# Patient Record
Sex: Female | Born: 1955 | Race: White | Hispanic: No | Smoking: Never smoker
Health system: Southern US, Community
[De-identification: ages and names within clinical notes are randomized; demographics above are authoritative.]

## PROBLEM LIST (undated history)

## (undated) DIAGNOSIS — E049 Nontoxic goiter, unspecified: Secondary | ICD-10-CM

## (undated) DIAGNOSIS — E119 Type 2 diabetes mellitus without complications: Secondary | ICD-10-CM

## (undated) DIAGNOSIS — F419 Anxiety disorder, unspecified: Secondary | ICD-10-CM

## (undated) DIAGNOSIS — G43909 Migraine, unspecified, not intractable, without status migrainosus: Secondary | ICD-10-CM

## (undated) DIAGNOSIS — F319 Bipolar disorder, unspecified: Secondary | ICD-10-CM

## (undated) DIAGNOSIS — F32A Depression, unspecified: Secondary | ICD-10-CM

## (undated) DIAGNOSIS — F329 Major depressive disorder, single episode, unspecified: Secondary | ICD-10-CM

## (undated) DIAGNOSIS — J45909 Unspecified asthma, uncomplicated: Secondary | ICD-10-CM

## (undated) DIAGNOSIS — I4891 Unspecified atrial fibrillation: Secondary | ICD-10-CM

## (undated) DIAGNOSIS — K589 Irritable bowel syndrome without diarrhea: Secondary | ICD-10-CM

## (undated) HISTORY — PX: ATRIAL FIBRILLATION ABLATION: EP1191

## (undated) HISTORY — DX: Type 2 diabetes mellitus without complications: E11.9

## (undated) HISTORY — PX: KNEE SURGERY: SHX244

## (undated) HISTORY — PX: ABDOMINAL HYSTERECTOMY: SHX81

## (undated) HISTORY — PX: CARDIOVERSION: SHX1299

## (undated) HISTORY — PX: ROTATOR CUFF REPAIR: SHX139

## (undated) HISTORY — PX: CARDIAC CATHETERIZATION: SHX172

---
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
## 2002-11-21 DIAGNOSIS — J189 Pneumonia, unspecified organism: Secondary | ICD-10-CM

## 2002-11-21 HISTORY — DX: Pneumonia, unspecified organism: J18.9

## 2005-12-28 DIAGNOSIS — N3010 Interstitial cystitis (chronic) without hematuria: Secondary | ICD-10-CM | POA: Insufficient documentation

## 2005-12-28 HISTORY — DX: Interstitial cystitis (chronic) without hematuria: N30.10

## 2006-02-17 DIAGNOSIS — T50995A Adverse effect of other drugs, medicaments and biological substances, initial encounter: Secondary | ICD-10-CM | POA: Insufficient documentation

## 2006-02-17 HISTORY — DX: Adverse effect of other drugs, medicaments and biological substances, initial encounter: T50.995A

## 2008-03-20 DIAGNOSIS — E8881 Metabolic syndrome: Secondary | ICD-10-CM

## 2008-03-20 HISTORY — DX: Metabolic syndrome: E88.81

## 2013-10-11 DIAGNOSIS — F319 Bipolar disorder, unspecified: Secondary | ICD-10-CM

## 2013-10-11 DIAGNOSIS — E669 Obesity, unspecified: Secondary | ICD-10-CM

## 2013-10-11 DIAGNOSIS — E1169 Type 2 diabetes mellitus with other specified complication: Secondary | ICD-10-CM

## 2013-10-11 DIAGNOSIS — R519 Headache, unspecified: Secondary | ICD-10-CM | POA: Insufficient documentation

## 2013-10-11 DIAGNOSIS — G43909 Migraine, unspecified, not intractable, without status migrainosus: Secondary | ICD-10-CM | POA: Insufficient documentation

## 2013-10-11 DIAGNOSIS — G8929 Other chronic pain: Secondary | ICD-10-CM

## 2013-10-11 HISTORY — DX: Other chronic pain: G89.29

## 2013-10-11 HISTORY — DX: Bipolar disorder, unspecified: F31.9

## 2013-10-11 HISTORY — DX: Migraine, unspecified, not intractable, without status migrainosus: G43.909

## 2013-10-11 HISTORY — DX: Type 2 diabetes mellitus with other specified complication: E66.9

## 2013-10-11 HISTORY — DX: Type 2 diabetes mellitus with other specified complication: E11.69

## 2013-10-11 HISTORY — DX: Headache, unspecified: R51.9

## 2014-09-01 DIAGNOSIS — I4891 Unspecified atrial fibrillation: Secondary | ICD-10-CM | POA: Diagnosis present

## 2014-09-01 DIAGNOSIS — E669 Obesity, unspecified: Secondary | ICD-10-CM | POA: Insufficient documentation

## 2014-09-01 DIAGNOSIS — I200 Unstable angina: Secondary | ICD-10-CM

## 2014-09-01 DIAGNOSIS — E785 Hyperlipidemia, unspecified: Secondary | ICD-10-CM

## 2014-09-01 DIAGNOSIS — J45909 Unspecified asthma, uncomplicated: Secondary | ICD-10-CM | POA: Insufficient documentation

## 2014-09-01 HISTORY — DX: Obesity, unspecified: E66.9

## 2014-09-01 HISTORY — DX: Hyperlipidemia, unspecified: E78.5

## 2014-09-01 HISTORY — DX: Unstable angina: I20.0

## 2014-09-01 HISTORY — DX: Unspecified atrial fibrillation: I48.91

## 2015-11-14 DIAGNOSIS — Z7409 Other reduced mobility: Secondary | ICD-10-CM | POA: Insufficient documentation

## 2015-11-14 DIAGNOSIS — G8929 Other chronic pain: Secondary | ICD-10-CM | POA: Insufficient documentation

## 2015-11-14 DIAGNOSIS — Z789 Other specified health status: Secondary | ICD-10-CM

## 2015-11-14 DIAGNOSIS — M25522 Pain in left elbow: Secondary | ICD-10-CM | POA: Insufficient documentation

## 2015-11-14 DIAGNOSIS — M25512 Pain in left shoulder: Secondary | ICD-10-CM | POA: Insufficient documentation

## 2015-11-14 HISTORY — DX: Other specified health status: Z78.9

## 2015-11-14 HISTORY — DX: Other reduced mobility: Z74.09

## 2015-11-14 HISTORY — DX: Pain in left elbow: M25.522

## 2015-11-14 HISTORY — DX: Other chronic pain: G89.29

## 2016-04-15 DIAGNOSIS — R12 Heartburn: Secondary | ICD-10-CM | POA: Insufficient documentation

## 2016-04-15 DIAGNOSIS — Z1211 Encounter for screening for malignant neoplasm of colon: Secondary | ICD-10-CM

## 2016-04-15 HISTORY — DX: Heartburn: R12

## 2016-04-15 HISTORY — DX: Encounter for screening for malignant neoplasm of colon: Z12.11

## 2016-10-07 DIAGNOSIS — Z8659 Personal history of other mental and behavioral disorders: Secondary | ICD-10-CM

## 2016-10-07 HISTORY — DX: Personal history of other mental and behavioral disorders: Z86.59

## 2016-12-16 DIAGNOSIS — M7502 Adhesive capsulitis of left shoulder: Secondary | ICD-10-CM

## 2016-12-16 HISTORY — DX: Adhesive capsulitis of left shoulder: M75.02

## 2016-12-16 HISTORY — DX: Morbid (severe) obesity due to excess calories: E66.01

## 2017-04-22 DIAGNOSIS — N23 Unspecified renal colic: Secondary | ICD-10-CM

## 2017-04-22 HISTORY — DX: Unspecified renal colic: N23

## 2017-05-14 DIAGNOSIS — N3000 Acute cystitis without hematuria: Secondary | ICD-10-CM | POA: Insufficient documentation

## 2017-05-14 HISTORY — DX: Acute cystitis without hematuria: N30.00

## 2017-06-24 DIAGNOSIS — G40909 Epilepsy, unspecified, not intractable, without status epilepticus: Secondary | ICD-10-CM | POA: Insufficient documentation

## 2017-06-24 DIAGNOSIS — G43719 Chronic migraine without aura, intractable, without status migrainosus: Secondary | ICD-10-CM

## 2017-06-24 DIAGNOSIS — G35 Multiple sclerosis: Secondary | ICD-10-CM | POA: Insufficient documentation

## 2017-06-24 DIAGNOSIS — Z8679 Personal history of other diseases of the circulatory system: Secondary | ICD-10-CM

## 2017-06-24 HISTORY — DX: Epilepsy, unspecified, not intractable, without status epilepticus: G40.909

## 2017-06-24 HISTORY — DX: Chronic migraine without aura, intractable, without status migrainosus: G43.719

## 2017-06-24 HISTORY — DX: Personal history of other diseases of the circulatory system: Z86.79

## 2017-06-24 HISTORY — DX: Multiple sclerosis: G35

## 2018-06-05 DIAGNOSIS — G4730 Sleep apnea, unspecified: Secondary | ICD-10-CM | POA: Insufficient documentation

## 2018-06-05 HISTORY — DX: Sleep apnea, unspecified: G47.30

## 2019-02-03 DIAGNOSIS — L0390 Cellulitis, unspecified: Secondary | ICD-10-CM | POA: Insufficient documentation

## 2019-02-03 DIAGNOSIS — L0291 Cutaneous abscess, unspecified: Secondary | ICD-10-CM | POA: Insufficient documentation

## 2019-02-03 HISTORY — DX: Cellulitis, unspecified: L03.90

## 2019-02-03 HISTORY — DX: Cutaneous abscess, unspecified: L02.91

## 2019-02-04 DIAGNOSIS — Z9181 History of falling: Secondary | ICD-10-CM

## 2019-02-04 HISTORY — DX: History of falling: Z91.81

## 2019-09-06 ENCOUNTER — Other Ambulatory Visit: Payer: Self-pay

## 2019-09-06 ENCOUNTER — Encounter (HOSPITAL_BASED_OUTPATIENT_CLINIC_OR_DEPARTMENT_OTHER): Payer: Self-pay

## 2019-09-06 ENCOUNTER — Emergency Department (HOSPITAL_BASED_OUTPATIENT_CLINIC_OR_DEPARTMENT_OTHER)
Admission: EM | Admit: 2019-09-06 | Discharge: 2019-09-07 | Disposition: A | Discharge status: Home / Self Care | Payer: Medicaid - Out of State | Attending: Emergency Medicine | Admitting: Emergency Medicine

## 2019-09-06 DIAGNOSIS — G43809 Other migraine, not intractable, without status migrainosus: Secondary | ICD-10-CM | POA: Diagnosis present

## 2019-09-06 DIAGNOSIS — R002 Palpitations: Secondary | ICD-10-CM | POA: Diagnosis not present

## 2019-09-06 DIAGNOSIS — E119 Type 2 diabetes mellitus without complications: Secondary | ICD-10-CM | POA: Insufficient documentation

## 2019-09-06 DIAGNOSIS — G43409 Hemiplegic migraine, not intractable, without status migrainosus: Secondary | ICD-10-CM | POA: Diagnosis not present

## 2019-09-06 DIAGNOSIS — J45909 Unspecified asthma, uncomplicated: Secondary | ICD-10-CM | POA: Diagnosis not present

## 2019-09-06 HISTORY — DX: Type 2 diabetes mellitus without complications: E11.9

## 2019-09-06 HISTORY — DX: Unspecified atrial fibrillation: I48.91

## 2019-09-06 HISTORY — DX: Unspecified asthma, uncomplicated: J45.909

## 2019-09-06 MED ORDER — DIPHENHYDRAMINE HCL 50 MG/ML IJ SOLN
25.0000 mg | Freq: Once | INTRAMUSCULAR | Status: AC
  Administered 2019-09-06: 25 mg via INTRAVENOUS
  Filled 2019-09-06: qty 1

## 2019-09-06 MED ORDER — SODIUM CHLORIDE 0.9 % IV BOLUS
1000.0000 mL | Freq: Once | INTRAVENOUS | Status: AC
  Administered 2019-09-06: 1000 mL via INTRAVENOUS

## 2019-09-06 MED ORDER — METOCLOPRAMIDE HCL 5 MG/ML IJ SOLN
10.0000 mg | Freq: Once | INTRAMUSCULAR | Status: AC
  Administered 2019-09-06: 10 mg via INTRAVENOUS
  Filled 2019-09-06: qty 2

## 2019-09-06 NOTE — ED Triage Notes (Signed)
Pt c/o heart racing and feels like "afib" x today-also c/o migraine x today-NAD-steady gait

## 2019-09-06 NOTE — ED Provider Notes (Signed)
[facility] Provider Note: Georgena Spurling, MD, FACEP  CSN: [id number] MRN: [id number] ARRIVAL: 09/06/19 at [place]: [place]  Migraine   HISTORY OF PRESENT ILLNESS  09/06/19 11:09 PM Paula Stout is a 63 y.o. female with a history of migraines and atrial fibrillation.  She is here with headache that began about 3 PM.  It began in her right temple and radiates posteriorly.  It is aching and throbbing in nature and she rates it as a 9 out of 10.  She has associated nausea and photophobia but no vomiting.  It is like previous migraines.  She also had the onset of palpitations about 6 PM.  She experiences the sensation of a rapid heartbeat and measured her pulse between 150 and 180.  It lasted about 20 minutes.  During this time she had chest pain and shortness of breath but those resolved when the palpitations resolved.  She has not had a recurrence.   Past Medical History:  Diagnosis Date  . A-fib ([place])   . Asthma   . Diabetes mellitus without complication ([place])   . Migraine     Past Surgical History:  Procedure Laterality Date  . ABDOMINAL HYSTERECTOMY    . ATRIAL FIBRILLATION ABLATION    . CESAREAN SECTION    . KNEE SURGERY    . ROTATOR CUFF REPAIR      No family history on file.  Social History   Tobacco Use  . Smoking status: Never Smoker  . Smokeless tobacco: Never Used  Substance Use Topics  . Alcohol use: Never  . Drug use: Never    Prior to Admission medications   Not on File    Allergies Bee venom, Asa [aspirin], Nsaids, Sulfa antibiotics, and Vancomycin   REVIEW OF SYSTEMS  Negative except as noted here or in the History of Present Illness.   PHYSICAL EXAMINATION  Initial Vital Signs Blood pressure (!) 154/96, pulse 69, temperature 98.9 F (37.2 C), temperature source Oral, resp. rate 13, height 5\' 6"  (1.676 m), weight 102.5 kg, SpO2 99 %.  Examination General: Well-developed, well-nourished female in no  acute distress; appearance consistent with age of record HENT: normocephalic; atraumatic Eyes: pupils equal, round and reactive to light; extraocular muscles intact; photophobia Neck: supple Heart: regular rate and rhythm Lungs: clear to auscultation bilaterally Abdomen: soft; nondistended; nontender; bowel sounds present Extremities: No deformity; full range of motion; pulses normal Neurologic: Awake, alert and oriented; motor function intact in all extremities and symmetric; no facial droop Skin: Warm and dry Psychiatric: Normal mood and affect   RESULTS  Summary of this visit's results, reviewed and interpreted by myself:   EKG Interpretation  Date/Time:  Thursday September 06 2019 19:47:13 EST Ventricular Rate:  83 PR Interval:  200 QRS Duration: 84 QT Interval:  370 QTC Calculation: 434 R Axis:   -24 Text Interpretation: Normal sinus rhythm Normal ECG No previous ECGs available Confirmed by Shanon Rosser [phone]) on 09/06/2019 10:39:46 PM      Laboratory Studies: No results found for this or any previous visit (from the past 24 hour(s)). Imaging Studies: No results found.  ED COURSE and MDM  Nursing notes, initial and subsequent vitals signs, including pulse oximetry, reviewed and interpreted by myself.  Vitals:   09/06/19 2130 09/06/19 2200 09/06/19 2230 09/07/19 0030  BP: (!) 146/87 (!) 149/92 (!) 154/96 137/80  Pulse: 71 70 69 69  Resp: 16 15 13 13   Temp:  TempSrc:      SpO2: 97% 97% 99% 97%  Weight:      Height:       Medications  sodium chloride 0.9 % bolus 1,000 mL (0 mLs Intravenous Stopped 09/07/19 0040)  diphenhydrAMINE (BENADRYL) injection 25 mg (25 mg Intravenous Given 09/06/19 2336)  metoCLOPramide (REGLAN) injection 10 mg (10 mg Intravenous Given 09/06/19 2336)    12:44 AM Headache improved after IV fluids and medications.  Patient states she is ready to go home.  Patient has had no arrhythmia while on monitor in the ED.  PROCEDURES    Procedures   ED DIAGNOSES     ICD-10-CM   1. Sporadic migraine  G43.409   2. Rapid palpitations  R00.2        [place] Paolucci, Jenny Reichmann, MD 09/07/19 [phone]

## 2020-02-18 ENCOUNTER — Other Ambulatory Visit: Payer: Self-pay

## 2020-02-18 ENCOUNTER — Encounter: Payer: Self-pay | Admitting: Family Medicine

## 2020-02-18 ENCOUNTER — Ambulatory Visit (INDEPENDENT_AMBULATORY_CARE_PROVIDER_SITE_OTHER): Payer: 59 | Admitting: Family Medicine

## 2020-02-18 ENCOUNTER — Telehealth: Payer: Self-pay

## 2020-02-18 VITALS — BP 120/78 | HR 103 | Temp 97.0°F | Ht 66.0 in | Wt 244.2 lb

## 2020-02-18 DIAGNOSIS — I209 Angina pectoris, unspecified: Secondary | ICD-10-CM

## 2020-02-18 DIAGNOSIS — F3132 Bipolar disorder, current episode depressed, moderate: Secondary | ICD-10-CM

## 2020-02-18 DIAGNOSIS — F319 Bipolar disorder, unspecified: Secondary | ICD-10-CM

## 2020-02-18 DIAGNOSIS — F411 Generalized anxiety disorder: Secondary | ICD-10-CM | POA: Insufficient documentation

## 2020-02-18 DIAGNOSIS — G43109 Migraine with aura, not intractable, without status migrainosus: Secondary | ICD-10-CM | POA: Diagnosis not present

## 2020-02-18 DIAGNOSIS — E781 Pure hyperglyceridemia: Secondary | ICD-10-CM

## 2020-02-18 DIAGNOSIS — I482 Chronic atrial fibrillation, unspecified: Secondary | ICD-10-CM

## 2020-02-18 DIAGNOSIS — E1169 Type 2 diabetes mellitus with other specified complication: Secondary | ICD-10-CM

## 2020-02-18 DIAGNOSIS — F339 Major depressive disorder, recurrent, unspecified: Secondary | ICD-10-CM

## 2020-02-18 DIAGNOSIS — K219 Gastro-esophageal reflux disease without esophagitis: Secondary | ICD-10-CM

## 2020-02-18 DIAGNOSIS — E669 Obesity, unspecified: Secondary | ICD-10-CM

## 2020-02-18 HISTORY — DX: Generalized anxiety disorder: F41.1

## 2020-02-18 HISTORY — DX: Pure hyperglyceridemia: E78.1

## 2020-02-18 HISTORY — DX: Chronic atrial fibrillation, unspecified: I48.20

## 2020-02-18 HISTORY — DX: Bipolar disorder, current episode depressed, moderate: F31.32

## 2020-02-18 HISTORY — DX: Major depressive disorder, recurrent, unspecified: F33.9

## 2020-02-18 MED ORDER — FENOFIBRATE 145 MG PO TABS: 145.0000 mg | ORAL_TABLET | Freq: Every day | ORAL | 2 refills | Status: DC

## 2020-02-18 MED ORDER — BUPROPION HCL ER (XL) 300 MG PO TB24: 300.0000 mg | ORAL_TABLET | Freq: Every day | ORAL | 2 refills | Status: DC

## 2020-02-18 MED ORDER — QUETIAPINE FUMARATE 300 MG PO TABS: 300.0000 mg | ORAL_TABLET | Freq: Every day | ORAL | 2 refills | Status: DC

## 2020-02-18 MED ORDER — BUTALBITAL-APAP-CAFFEINE 50-325-40 MG PO TABS: 1.0000 | ORAL_TABLET | Freq: Four times a day (QID) | ORAL | 5 refills | Status: DC

## 2020-02-18 MED ORDER — ATORVASTATIN CALCIUM 20 MG PO TABS: 20.0000 mg | ORAL_TABLET | Freq: Every day | ORAL | 2 refills | Status: DC

## 2020-02-18 MED ORDER — LATUDA 120 MG PO TABS: 1.0000 | ORAL_TABLET | Freq: Every day | ORAL | 2 refills | Status: DC

## 2020-02-18 MED ORDER — FREESTYLE TEST VI STRP: 1.0000 | ORAL_STRIP | Freq: Every day | 3 refills | Status: DC

## 2020-02-18 MED ORDER — ONDANSETRON 4 MG PO TBDP: 4.0000 mg | ORAL_TABLET | Freq: Three times a day (TID) | ORAL | 2 refills | Status: DC | PRN

## 2020-02-18 MED ORDER — PANTOPRAZOLE SODIUM 40 MG PO TBEC: 40.0000 mg | DELAYED_RELEASE_TABLET | Freq: Every day | ORAL | 2 refills | Status: DC

## 2020-02-18 MED ORDER — METFORMIN HCL 1000 MG PO TABS: 1000.0000 mg | ORAL_TABLET | Freq: Two times a day (BID) | ORAL | 2 refills | Status: DC

## 2020-02-18 MED ORDER — ALOGLIPTIN BENZOATE 12.5 MG PO TABS: 1.0000 | ORAL_TABLET | Freq: Every day | ORAL | 2 refills | Status: DC

## 2020-02-18 MED ORDER — METOPROLOL SUCCINATE ER 25 MG PO TB24: 25.0000 mg | ORAL_TABLET | Freq: Every day | ORAL | 2 refills | Status: DC

## 2020-02-18 MED ORDER — DILTIAZEM HCL ER COATED BEADS 120 MG PO CP24: 120.0000 mg | ORAL_CAPSULE | Freq: Every day | ORAL | 2 refills | Status: DC

## 2020-02-18 MED ORDER — TOPIRAMATE 50 MG PO TABS: 150.0000 mg | ORAL_TABLET | Freq: Two times a day (BID) | ORAL | 2 refills | Status: DC

## 2020-02-18 MED ORDER — FREESTYLE LANCETS MISC: 1.0000 | Freq: Every day | 3 refills | Status: DC

## 2020-02-18 MED ORDER — APIXABAN 5 MG PO TABS: 5.0000 mg | ORAL_TABLET | Freq: Two times a day (BID) | ORAL | 2 refills | Status: DC

## 2020-02-18 MED ORDER — FREESTYLE SYSTEM KIT: PACK | 0 refills | Status: DC

## 2020-02-18 MED ORDER — NITROGLYCERIN 0.4 MG SL SUBL: 0.4000 mg | SUBLINGUAL_TABLET | SUBLINGUAL | 2 refills | Status: DC | PRN

## 2020-02-18 NOTE — Telephone Encounter (Signed)
PA initiated via Covermymeds; KEY: [id number]. Awaiting determination.

## 2020-02-18 NOTE — Patient Instructions (Signed)
Keep the diet clean and stay active.  If you do not hear anything about your referrals in the next 1-2 weeks, call our office and ask for an update.  Coping skills Choose 5 that work for you:  Take a deep breath  Count to 20  Read a book  Do a puzzle  Meditate  Bake  Sing  Knit  Garden  Pray  Go outside  Call a friend  Listen to music  Take a walk  Color  Send a note  Take a bath  Watch a movie  Be alone in a quiet place  Pet an animal  Visit a friend  Journal  Exercise  Stretch   Please consider counseling. Contact [phone] to schedule an appointment or inquire about cost/insurance coverage.  [facility] [address] [place], [place] [address] [phone]  [facility] [address] [place], [place] [address] [phone]  [facility] [place], [place] [address] [phone]  [facility] [address], [place], [place], #[phone] [address], [place], [place], [place]  [facility] [place], [place] and [place], [place], [place]  [facility] [place], [place]  Call one of these offices sooner than later as it can take 2-3 months to get a new patient appointment.   Let us know if you need anything.

## 2020-02-18 NOTE — Progress Notes (Signed)
Chief Complaint  Patient presents with  . New Patient (Initial Visit)       New Patient Visit SUBJECTIVE: HPI: Paula Stout is an 64 y.o.female who is being seen for establishing care.  The patient was previously seen in [place].   Patient has a history of type 2 diabetes.  She is on metformin 1000 mg twice daily and alogliptin 12.5 mg daily.  Diet is fair, she does not exercise routinely.  She is on Lipitor 20 mg daily.  She also takes fenofibrate 145 mg daily for hypertriglyceridemia.  Patient has a history of atrial fibrillation diagnosed around 2002.  She had an ablation in early 2020.  She does not have a cardiologist in the area.  She is on Eliquis 5 mg twice daily in addition to Cardizem 120 mg daily and Toprol 25 mg daily.  She is tolerating the medicine well and reports no adverse effects.  The patient has a history of migraines with aura.  She currently has around 10 headaches per month.  For abortive medicine is mainly Fioricet but she does have Maxalt prescribed.  She takes Topamax 150 mg twice daily for maintenance therapy.  She does not currently have a headache.  The patient has a history of bipolar 1 disorder in addition to anxiety and depression.  She is currently on Seroquel 300 mg nightly, Wellbutrin XL 300 mg daily, and Latuda 120 mg daily.  Her husband died around 1 year ago and her mood has not been stable.  She does not follow with a psychiatrist. No HI or SI.   Past Medical History:  Diagnosis Date  . A-fib ([place])   . Asthma   . Diabetes mellitus without complication ([place])   . Migraine    Past Surgical History:  Procedure Laterality Date  . ABDOMINAL HYSTERECTOMY    . ATRIAL FIBRILLATION ABLATION    . CESAREAN SECTION    . KNEE SURGERY    . ROTATOR CUFF REPAIR     History reviewed. No pertinent family history. Allergies  Allergen Reactions  . Bee Venom Anaphylaxis  . Asa [Aspirin] Nausea And Vomiting  . Nsaids Nausea And Vomiting  . Sulfa Antibiotics Rash   . Vancomycin Rash    Current Outpatient Medications:  .  albuterol (VENTOLIN HFA) 108 (90 Base) MCG/ACT inhaler, Inhale 2 puffs into the lungs every 4 (four) hours., Disp: , Rfl:  .  Alogliptin Benzoate 12.5 MG TABS, Take 1 tablet by mouth daily., Disp: 90 tablet, Rfl: 2 .  apixaban (ELIQUIS) 5 MG TABS tablet, Take 1 tablet (5 mg total) by mouth in the morning and at bedtime., Disp: 180 tablet, Rfl: 2 .  atorvastatin (LIPITOR) 20 MG tablet, Take 1 tablet (20 mg total) by mouth daily., Disp: 90 tablet, Rfl: 2 .  buPROPion (WELLBUTRIN XL) 300 MG 24 hr tablet, Take 1 tablet (300 mg total) by mouth daily., Disp: 90 tablet, Rfl: 2 .  butalbital-acetaminophen-caffeine (FIORICET) 50-325-40 MG tablet, Take 1 tablet by mouth every 6 (six) hours., Disp: 14 tablet, Rfl: 5 .  diltiazem (CARTIA XT) 120 MG 24 hr capsule, Take 1 capsule (120 mg total) by mouth daily., Disp: 90 capsule, Rfl: 2 .  fenofibrate (TRICOR) 145 MG tablet, Take 1 tablet (145 mg total) by mouth daily., Disp: 90 tablet, Rfl: 2 .  glucose blood (FREESTYLE TEST STRIPS) test strip, 1 each by Other route daily., Disp: 100 each, Rfl: 3 .  glucose monitoring kit (FREESTYLE) monitoring kit, Use daily to check blood  sugar.  DX E11.9, Disp: 1 each, Rfl: 0 .  Lancets (FREESTYLE) lancets, 1 each by Other route daily., Disp: 100 each, Rfl: 3 .  Lurasidone HCl (LATUDA) 120 MG TABS, Take 1 tablet (120 mg total) by mouth daily., Disp: 90 tablet, Rfl: 2 .  metoprolol succinate (TOPROL-XL) 25 MG 24 hr tablet, Take 1 tablet (25 mg total) by mouth daily., Disp: 90 tablet, Rfl: 2 .  nitroGLYCERIN (NITROSTAT) 0.4 MG SL tablet, Place 1 tablet (0.4 mg total) under the tongue as needed., Disp: 25 tablet, Rfl: 2 .  ondansetron (ZOFRAN-ODT) 4 MG disintegrating tablet, Take 1 tablet (4 mg total) by mouth every 8 (eight) hours as needed for nausea., Disp: 20 tablet, Rfl: 2 .  pantoprazole (PROTONIX) 40 MG tablet, Take 1 tablet (40 mg total) by mouth daily., Disp:  90 tablet, Rfl: 2 .  rizatriptan (MAXALT-MLT) 10 MG disintegrating tablet, Place 1 tablet under the tongue daily as needed., Disp: , Rfl:  .  topiramate (TOPAMAX) 50 MG tablet, Take 3 tablets (150 mg total) by mouth in the morning and at bedtime., Disp: 540 tablet, Rfl: 2 .  metFORMIN (GLUCOPHAGE) 1000 MG tablet, Take 1 tablet (1,000 mg total) by mouth 2 (two) times daily with a meal., Disp: 180 tablet, Rfl: 2 .  QUEtiapine (SEROQUEL) 300 MG tablet, Take 1 tablet (300 mg total) by mouth at bedtime., Disp: 90 tablet, Rfl: 2   OBJECTIVE: BP 120/78 (BP Location: Left Arm, Patient Position: Sitting, Cuff Size: Large)   Pulse (!) 103   Temp (!) 97 F (36.1 C) (Temporal)   Ht '5\' 6"'  (1.676 m)   Wt 244 lb 4 oz (110.8 kg)   SpO2 96%   BMI 39.42 kg/m  General:  well developed, well nourished, in no apparent distress Skin:  no significant moles, warts, or growths Nose:  nares patent, septum midline, mucosa normal Throat/Pharynx:  lips and gingiva without lesion; tongue and uvula midline; non-inflamed pharynx; no exudates or postnasal drainage Lungs:  clear to auscultation, breath sounds equal bilaterally, no respiratory distress Cardio:  regular rate and rhythm, no LE edema or bruits Musculoskeletal:  symmetrical muscle groups noted without atrophy or deformity Neuro:  gait normal Psych: well oriented with normal range of affect and appropriate judgment/insight  ASSESSMENT/PLAN: Chronic atrial fibrillation (HCC) - Plan: apixaban (ELIQUIS) 5 MG TABS tablet, diltiazem (CARTIA XT) 120 MG 24 hr capsule, metoprolol succinate (TOPROL-XL) 25 MG 24 hr tablet, Ambulatory referral to Cardiology  Diabetes mellitus type 2 in obese ([place]) - Plan: Alogliptin Benzoate 12.5 MG TABS, atorvastatin (LIPITOR) 20 MG tablet, metFORMIN (GLUCOPHAGE) 1000 MG tablet, Lancets (FREESTYLE) lancets, glucose blood (FREESTYLE TEST STRIPS) test strip, glucose monitoring kit (FREESTYLE) monitoring kit  Migraine with aura and  without status migrainosus, not intractable - Plan: rizatriptan (MAXALT-MLT) 10 MG disintegrating tablet, topiramate (TOPAMAX) 50 MG tablet, butalbital-acetaminophen-caffeine (FIORICET) 50-325-40 MG tablet, ondansetron (ZOFRAN-ODT) 4 MG disintegrating tablet  Hypertriglyceridemia - Plan: atorvastatin (LIPITOR) 20 MG tablet, fenofibrate (TRICOR) 145 MG tablet  Bipolar 1 disorder (HCC) - Plan: Lurasidone HCl (LATUDA) 120 MG TABS, QUEtiapine (SEROQUEL) 300 MG tablet, Ambulatory referral to Psychiatry  Depression, recurrent ([place]) - Plan: buPROPion (WELLBUTRIN XL) 300 MG 24 hr tablet  GAD (generalized anxiety disorder)  Angina pectoris (HCC) - Plan: nitroGLYCERIN (NITROSTAT) 0.4 MG SL tablet  Gastroesophageal reflux disease, unspecified whether esophagitis present - Plan: pantoprazole (PROTONIX) 40 MG tablet  1- Cont Eliquis, let me know if there are cost issues immediately, she has around 3 days' worth  left. Refer to cards. 2- Cont care. I will see her in 3 mo for a dedicated DM visit.  3- Cont Topamax and Fioricet prn.  4- Cont fibrate 5/6/7-continue current therapy with Latuda, Seroquel, and Wellbutrin.  I will refer her to the psychiatry team.  Anxiety coping techniques provided.  Outpatient psychiatry resources also provided. The patient voiced understanding and agreement to the plan.   [place], DO 02/18/20  3:52 PM

## 2020-02-19 ENCOUNTER — Telehealth: Payer: Self-pay

## 2020-02-19 ENCOUNTER — Inpatient Hospital Stay (HOSPITAL_BASED_OUTPATIENT_CLINIC_OR_DEPARTMENT_OTHER)
Admission: EM | Admit: 2020-02-19 | Discharge: 2020-02-21 | DRG: 310 | Disposition: A | Discharge status: Home / Self Care | Payer: 59 | Attending: Internal Medicine | Admitting: Internal Medicine

## 2020-02-19 ENCOUNTER — Encounter (HOSPITAL_BASED_OUTPATIENT_CLINIC_OR_DEPARTMENT_OTHER): Payer: Self-pay

## 2020-02-19 ENCOUNTER — Emergency Department (HOSPITAL_BASED_OUTPATIENT_CLINIC_OR_DEPARTMENT_OTHER): Payer: 59

## 2020-02-19 ENCOUNTER — Other Ambulatory Visit: Payer: Self-pay

## 2020-02-19 DIAGNOSIS — Z90710 Acquired absence of both cervix and uterus: Secondary | ICD-10-CM

## 2020-02-19 DIAGNOSIS — G43909 Migraine, unspecified, not intractable, without status migrainosus: Secondary | ICD-10-CM | POA: Diagnosis present

## 2020-02-19 DIAGNOSIS — Z881 Allergy status to other antibiotic agents status: Secondary | ICD-10-CM

## 2020-02-19 DIAGNOSIS — E1122 Type 2 diabetes mellitus with diabetic chronic kidney disease: Secondary | ICD-10-CM | POA: Diagnosis present

## 2020-02-19 DIAGNOSIS — Z6838 Body mass index (BMI) 38.0-38.9, adult: Secondary | ICD-10-CM

## 2020-02-19 DIAGNOSIS — E1169 Type 2 diabetes mellitus with other specified complication: Secondary | ICD-10-CM | POA: Diagnosis present

## 2020-02-19 DIAGNOSIS — I4892 Unspecified atrial flutter: Secondary | ICD-10-CM | POA: Diagnosis present

## 2020-02-19 DIAGNOSIS — F319 Bipolar disorder, unspecified: Secondary | ICD-10-CM | POA: Diagnosis present

## 2020-02-19 DIAGNOSIS — Z20822 Contact with and (suspected) exposure to covid-19: Secondary | ICD-10-CM | POA: Diagnosis present

## 2020-02-19 DIAGNOSIS — I129 Hypertensive chronic kidney disease with stage 1 through stage 4 chronic kidney disease, or unspecified chronic kidney disease: Secondary | ICD-10-CM | POA: Diagnosis present

## 2020-02-19 DIAGNOSIS — I480 Paroxysmal atrial fibrillation: Principal | ICD-10-CM | POA: Diagnosis present

## 2020-02-19 DIAGNOSIS — Z886 Allergy status to analgesic agent status: Secondary | ICD-10-CM

## 2020-02-19 DIAGNOSIS — Z888 Allergy status to other drugs, medicaments and biological substances status: Secondary | ICD-10-CM

## 2020-02-19 DIAGNOSIS — J45909 Unspecified asthma, uncomplicated: Secondary | ICD-10-CM | POA: Diagnosis present

## 2020-02-19 DIAGNOSIS — R079 Chest pain, unspecified: Secondary | ICD-10-CM

## 2020-02-19 DIAGNOSIS — N183 Chronic kidney disease, stage 3 unspecified: Secondary | ICD-10-CM | POA: Diagnosis present

## 2020-02-19 DIAGNOSIS — Z7984 Long term (current) use of oral hypoglycemic drugs: Secondary | ICD-10-CM

## 2020-02-19 DIAGNOSIS — I77810 Thoracic aortic ectasia: Secondary | ICD-10-CM | POA: Diagnosis present

## 2020-02-19 DIAGNOSIS — I4891 Unspecified atrial fibrillation: Secondary | ICD-10-CM | POA: Diagnosis present

## 2020-02-19 DIAGNOSIS — N1831 Chronic kidney disease, stage 3a: Secondary | ICD-10-CM | POA: Diagnosis present

## 2020-02-19 DIAGNOSIS — E785 Hyperlipidemia, unspecified: Secondary | ICD-10-CM | POA: Diagnosis present

## 2020-02-19 DIAGNOSIS — R7989 Other specified abnormal findings of blood chemistry: Secondary | ICD-10-CM | POA: Diagnosis present

## 2020-02-19 DIAGNOSIS — Z7901 Long term (current) use of anticoagulants: Secondary | ICD-10-CM

## 2020-02-19 DIAGNOSIS — E669 Obesity, unspecified: Secondary | ICD-10-CM | POA: Diagnosis present

## 2020-02-19 DIAGNOSIS — Z882 Allergy status to sulfonamides status: Secondary | ICD-10-CM

## 2020-02-19 DIAGNOSIS — Z79899 Other long term (current) drug therapy: Secondary | ICD-10-CM

## 2020-02-19 HISTORY — DX: Depression, unspecified: F32.A

## 2020-02-19 HISTORY — DX: Anxiety disorder, unspecified: F41.9

## 2020-02-19 HISTORY — DX: Bipolar disorder, unspecified: F31.9

## 2020-02-19 HISTORY — DX: Irritable bowel syndrome, unspecified: K58.9

## 2020-02-19 HISTORY — DX: Chest pain, unspecified: R07.9

## 2020-02-19 LAB — BASIC METABOLIC PANEL
Anion gap: 12 (ref 5–15)
BUN: 20 mg/dL (ref 8–23)
CO2: 20 mmol/L — ABNORMAL LOW (ref 22–32)
Calcium: 9.5 mg/dL (ref 8.9–10.3)
Chloride: 108 mmol/L (ref 98–111)
Creatinine, Ser: 1.99 mg/dL — ABNORMAL HIGH (ref 0.44–1.00)
GFR calc Af Amer: 30 mL/min — ABNORMAL LOW (ref >60)
GFR calc non Af Amer: 26 mL/min — ABNORMAL LOW (ref >60)
Glucose, Bld: 167 mg/dL — ABNORMAL HIGH (ref 70–99)
Potassium: 3.7 mmol/L (ref 3.5–5.1)
Sodium: 140 mmol/L (ref 135–145)

## 2020-02-19 LAB — CBC
HCT: 39.5 % (ref 36.0–46.0)
Hemoglobin: 12.6 g/dL (ref 12.0–15.0)
MCH: 29.4 pg (ref 26.0–34.0)
MCHC: 31.9 g/dL (ref 30.0–36.0)
MCV: 92.3 fL (ref 80.0–100.0)
Platelets: 332 10*3/uL (ref 150–400)
RBC: 4.28 MIL/uL (ref 3.87–5.11)
RDW: 14.6 % (ref 11.5–15.5)
WBC: 9.4 10*3/uL (ref 4.0–10.5)
nRBC: 0 % (ref 0.0–0.2)

## 2020-02-19 LAB — TROPONIN I (HIGH SENSITIVITY)
Troponin I (High Sensitivity): 4 ng/L (ref <18)
Troponin I (High Sensitivity): 8 ng/L (ref <18)

## 2020-02-19 MED ORDER — NITROGLYCERIN 0.4 MG SL SUBL
0.4000 mg | SUBLINGUAL_TABLET | SUBLINGUAL | Status: DC | PRN
  Administered 2020-02-19 (×2): 0.4 mg via SUBLINGUAL
  Filled 2020-02-19 (×2): qty 1

## 2020-02-19 NOTE — Telephone Encounter (Signed)
Will appeal however- Pt will need to come and sign appeal form (this is required by her plan to appeal PA determinations). Form given to Robin.

## 2020-02-19 NOTE — Telephone Encounter (Signed)
Plz see if pt has failed any of these other medications. Ty.

## 2020-02-19 NOTE — Telephone Encounter (Signed)
PA initiated via Covermymeds; KEY: [id number]. Awaiting determination.

## 2020-02-19 NOTE — Telephone Encounter (Signed)
I spoke with the patient and YES, She has tried respiridone ( stopped due to not working) Also tried Ziprasidine and worsened her migraines, And tried Abilify---was on before [place] and MD changed to [place] as felt abilify not working well for her and [name] would be much better.  She has been on [place] for one year.

## 2020-02-19 NOTE — Telephone Encounter (Signed)
Form has been signed by the patient and faxed///did receive fax confirmation.

## 2020-02-19 NOTE — Telephone Encounter (Signed)
PA denied.   -Pt must be participating in a behavorial management program, AND  -Trial, failure, intolerance, or contraindication to THREE of the following formulary alternatives: Risperidone, Quetiapine (she is currently on this), Olazapine, Ziprasidone, and Aripiprazole

## 2020-02-19 NOTE — ED Triage Notes (Signed)
Chest pain than began ~30 mins ago, central pressure that does not radiate. 1 nitro taken without relief. Hx of a fib & angina. SOB, dizziness.

## 2020-02-19 NOTE — Telephone Encounter (Signed)
PA approved. Effective 02/19/2020 to 02/18/2021.

## 2020-02-19 NOTE — ED Provider Notes (Signed)
[facility] EMERGENCY DEPARTMENT Provider Note   CSN: [id number] Arrival date & time: 02/19/20  1856     History Chief Complaint  Patient presents with  . Chest Pain    Paula Stout is a 64 y.o. female.  But states she is been    Patient presents with chest pain.  Pressure in the mid chest.  Has had around the half hour feeling a little bad all day.  Has had shortness of breath and felt dizzy.  States she has been feeling more short of breath.  Had an A. fib ablation 6 months ago.  States she was told there was another area of ablation that needed to be done but they could not do it due to Covid.  Also states that she has nitroglycerin that she took that mildly helped the pain.  States that she had been diagnosed with angina.  Has not had stress test or heart catheterization.  States that she was told that they needed to be done but due to Covid he could not get done.  No swelling her legs.  No fever.  States she has been out of A. fib since her ablation but also states that she is checked on her iWatch and she has been in and out of A. fib recently also.  She is on anticoagulation.  Does have history of SVT.  Also history of A. fib and states that on an iWatch recently her heart rate was going too fast that he could not get measured.    Past Medical History:  Diagnosis Date  . A-fib ([place])   . Asthma   . Diabetes mellitus without complication ([place])   . Migraine     Patient Active Problem List   Diagnosis Date Noted  . Chest pain 02/19/2020  . Chronic atrial fibrillation ([place]) 02/18/2020  . Diabetes mellitus type 2 in obese ([place]) 02/18/2020  . Migraine with aura and without status migrainosus, not intractable 02/18/2020  . Hypertriglyceridemia 02/18/2020  . Depression, recurrent ([place]) 02/18/2020  . Bipolar 1 disorder ([place]) 02/18/2020  . GAD (generalized anxiety disorder) 02/18/2020    Past Surgical History:  Procedure Laterality Date  . ABDOMINAL HYSTERECTOMY     . ATRIAL FIBRILLATION ABLATION    . CESAREAN SECTION    . KNEE SURGERY    . ROTATOR CUFF REPAIR       OB History   No obstetric history on file.     History reviewed. No pertinent family history.  Social History   Tobacco Use  . Smoking status: Never Smoker  . Smokeless tobacco: Never Used  Vaping Use  . Vaping Use: Never used  Substance Use Topics  . Alcohol use: Never  . Drug use: Never    Home Medications Prior to Admission medications   Medication Sig Start Date End Date Taking? Authorizing Provider  albuterol (VENTOLIN HFA) 108 (90 Base) MCG/ACT inhaler Inhale 2 puffs into the lungs every 4 (four) hours. 06/08/19   [provider]  Alogliptin Benzoate 12.5 MG TABS Take 1 tablet by mouth daily. 02/18/20   [name], DO  apixaban (ELIQUIS) 5 MG TABS tablet Take 1 tablet (5 mg total) by mouth in the morning and at bedtime. 02/18/20   [name], DO  atorvastatin (LIPITOR) 20 MG tablet Take 1 tablet (20 mg total) by mouth daily. 02/18/20   [name], DO  buPROPion (WELLBUTRIN XL) 300 MG 24 hr tablet Take 1 tablet (300 mg total) by  mouth daily. 02/18/20   [name], DO  butalbital-acetaminophen-caffeine (FIORICET) [phone] MG tablet Take 1 tablet by mouth every 6 (six) hours. 02/18/20   Wendling, Crosby Oyster, DO  diltiazem (CARTIA XT) 120 MG 24 hr capsule Take 1 capsule (120 mg total) by mouth daily. 02/18/20   [name], DO  fenofibrate (TRICOR) 145 MG tablet Take 1 tablet (145 mg total) by mouth daily. 02/18/20   [name], DO  glucose blood (FREESTYLE TEST STRIPS) test strip 1 each by Other route daily. 02/18/20   [name], DO  glucose monitoring kit (FREESTYLE) monitoring kit Use daily to check blood sugar.  DX E11.9 02/18/20   [name], DO  Lancets (FREESTYLE) lancets 1 each by Other route daily. 02/18/20   [name], DO  Lurasidone HCl  (LATUDA) 120 MG TABS Take 1 tablet (120 mg total) by mouth daily. 02/18/20   [name], DO  metFORMIN (GLUCOPHAGE) 1000 MG tablet Take 1 tablet (1,000 mg total) by mouth 2 (two) times daily with a meal. 02/18/20   Wendling, Crosby Oyster, DO  metoprolol succinate (TOPROL-XL) 25 MG 24 hr tablet Take 1 tablet (25 mg total) by mouth daily. 02/18/20   [name], DO  nitroGLYCERIN (NITROSTAT) 0.4 MG SL tablet Place 1 tablet (0.4 mg total) under the tongue as needed. 02/18/20   [name], DO  ondansetron (ZOFRAN-ODT) 4 MG disintegrating tablet Take 1 tablet (4 mg total) by mouth every 8 (eight) hours as needed for nausea. 02/18/20   [name], DO  pantoprazole (PROTONIX) 40 MG tablet Take 1 tablet (40 mg total) by mouth daily. 02/18/20   [name], DO  QUEtiapine (SEROQUEL) 300 MG tablet Take 1 tablet (300 mg total) by mouth at bedtime. 02/18/20   [name], DO  rizatriptan (MAXALT-MLT) 10 MG disintegrating tablet Place 1 tablet under the tongue daily as needed. 01/26/18   [provider]  topiramate (TOPAMAX) 50 MG tablet Take 3 tablets (150 mg total) by mouth in the morning and at bedtime. 02/18/20   [name], DO    Allergies    Bee venom, Asa [aspirin], Nsaids, Sulfa antibiotics, and Vancomycin  Review of Systems   Review of Systems  Constitutional: Negative for appetite change.  HENT: Negative for congestion.   Respiratory: Positive for shortness of breath.   Cardiovascular: Positive for chest pain and palpitations. Negative for leg swelling.  Gastrointestinal: Negative for abdominal pain.  Genitourinary: Negative for flank pain.  Musculoskeletal: Negative for back pain.  Skin: Negative for rash.  Neurological: Negative for weakness.  Psychiatric/Behavioral: Negative for confusion.    Physical Exam Updated Vital Signs BP (!) 149/89 Comment: room air  Pulse 77 Comment: room air  Temp 98.7  F (37.1 C) (Oral)   Resp 17 Comment: room air  Ht '5\' 6"'  (1.676 m)   Wt 108.9 kg   SpO2 97% Comment: room air  BMI 38.74 kg/m   Physical Exam Vitals and nursing note reviewed.  HENT:     Head: Normocephalic.  Cardiovascular:     Rate and Rhythm: Normal rate and regular rhythm.  Pulmonary:     Breath sounds: Normal breath sounds.  Chest:     Chest wall: No tenderness.  Abdominal:     Tenderness: There is no abdominal tenderness.  Musculoskeletal:     Cervical back: Neck supple.  Skin:    General: Skin is warm.     Capillary  Refill: Capillary refill takes less than 2 seconds.  Neurological:     Mental Status: She is alert and oriented to person, place, and time.     ED Results / Procedures / Treatments   Labs (all labs ordered are listed, but only abnormal results are displayed) Labs Reviewed  BASIC METABOLIC PANEL - Abnormal; Notable for the following components:      Result Value   CO2 20 (*)    Glucose, Bld 167 (*)    Creatinine, Ser 1.99 (*)    GFR calc non Af Amer 26 (*)    GFR calc Af Amer 30 (*)    All other components within normal limits  CBC  TROPONIN I (HIGH SENSITIVITY)  TROPONIN I (HIGH SENSITIVITY)    EKG EKG Interpretation  Date/Time:  Tuesday February 19 2020 18:55:56 EDT Ventricular Rate:  83 PR Interval:  188 QRS Duration: 90 QT Interval:  366 QTC Calculation: 430 R Axis:   -11 Text Interpretation: Normal sinus rhythm Nonspecific T wave abnormality Abnormal ECG Confirmed by Davonna Belling [phone]) on 02/19/2020 7:15:36 PM   Radiology DG Chest 2 View  Result Date: 02/19/2020 CLINICAL DATA:  Chest pain and central pressure without radiation EXAM: CHEST - 2 VIEW COMPARISON:  None FINDINGS: Slightly low lung volumes with some hazy opacity in the bases favoring atelectasis with central vascular crowding. Focal consolidative opacity. No convincing features of edema. No pneumothorax or effusion. The cardiomediastinal contours are unremarkable. No  acute osseous or soft tissue abnormality. Degenerative changes are present in the imaged spine and shoulders. IMPRESSION: Low volumes and atelectasis without other acute cardiopulmonary abnormality. Electronically Signed   By: Lovena Le M.D.   On: 02/19/2020 19:48    Procedures Procedures (including critical care time)  Medications Ordered in ED Medications  nitroGLYCERIN (NITROSTAT) SL tablet 0.4 mg (0.4 mg Sublingual Given 02/19/20 1939)    ED Course  I have reviewed the triage vital signs and the nursing notes.  Pertinent labs & imaging results that were available during my care of the patient were reviewed by me and considered in my medical decision making (see chart for details).    MDM Rules/Calculators/A&P                          Patient with anterior chest pain.  Began prior to arrival.  EKG overall reassuring.  States she has had some shortness of breath recently and felt her heart racing at times.  History of A. fib but not currently in A. fib.  But is on anticoagulation.  Has had previous ablation.  Also reportedly was due to have either a stress test or heart catheterization before she moved to [place]. Troponin negative x2.  Heart score came up to around 5.  With the question of plan of stress test or cath previously I feels the patient would benefit from admission to the hospital.  Discussed with Dr. Radford Pax from cardiology.  Thinks patient be suitable for internal medicine admission.  Discussed with Dr. Myna Hidalgo, who will admit the patient. Final Clinical Impression(s) / ED Diagnoses Final diagnoses:  Nonspecific chest pain    Rx / DC Orders ED Discharge Orders    None       Davonna Belling, MD 02/19/20 2317

## 2020-02-20 ENCOUNTER — Encounter (HOSPITAL_COMMUNITY): Payer: Self-pay | Admitting: Family Medicine

## 2020-02-20 ENCOUNTER — Observation Stay (HOSPITAL_COMMUNITY): Payer: 59

## 2020-02-20 DIAGNOSIS — E669 Obesity, unspecified: Secondary | ICD-10-CM

## 2020-02-20 DIAGNOSIS — I480 Paroxysmal atrial fibrillation: Secondary | ICD-10-CM | POA: Diagnosis present

## 2020-02-20 DIAGNOSIS — Z7901 Long term (current) use of anticoagulants: Secondary | ICD-10-CM

## 2020-02-20 DIAGNOSIS — R079 Chest pain, unspecified: Secondary | ICD-10-CM | POA: Diagnosis present

## 2020-02-20 DIAGNOSIS — Z881 Allergy status to other antibiotic agents status: Secondary | ICD-10-CM | POA: Diagnosis not present

## 2020-02-20 DIAGNOSIS — E1122 Type 2 diabetes mellitus with diabetic chronic kidney disease: Secondary | ICD-10-CM | POA: Diagnosis present

## 2020-02-20 DIAGNOSIS — N183 Chronic kidney disease, stage 3 unspecified: Secondary | ICD-10-CM

## 2020-02-20 DIAGNOSIS — N1831 Chronic kidney disease, stage 3a: Secondary | ICD-10-CM | POA: Diagnosis present

## 2020-02-20 DIAGNOSIS — I77810 Thoracic aortic ectasia: Secondary | ICD-10-CM | POA: Diagnosis present

## 2020-02-20 DIAGNOSIS — N1832 Chronic kidney disease, stage 3b: Secondary | ICD-10-CM | POA: Diagnosis not present

## 2020-02-20 DIAGNOSIS — Z886 Allergy status to analgesic agent status: Secondary | ICD-10-CM | POA: Diagnosis not present

## 2020-02-20 DIAGNOSIS — Z888 Allergy status to other drugs, medicaments and biological substances status: Secondary | ICD-10-CM | POA: Diagnosis not present

## 2020-02-20 DIAGNOSIS — Z90710 Acquired absence of both cervix and uterus: Secondary | ICD-10-CM | POA: Diagnosis not present

## 2020-02-20 DIAGNOSIS — Z7984 Long term (current) use of oral hypoglycemic drugs: Secondary | ICD-10-CM | POA: Diagnosis not present

## 2020-02-20 DIAGNOSIS — Z79899 Other long term (current) drug therapy: Secondary | ICD-10-CM | POA: Diagnosis not present

## 2020-02-20 DIAGNOSIS — E785 Hyperlipidemia, unspecified: Secondary | ICD-10-CM

## 2020-02-20 DIAGNOSIS — J45909 Unspecified asthma, uncomplicated: Secondary | ICD-10-CM | POA: Diagnosis present

## 2020-02-20 DIAGNOSIS — Z20822 Contact with and (suspected) exposure to covid-19: Secondary | ICD-10-CM | POA: Diagnosis present

## 2020-02-20 DIAGNOSIS — Z882 Allergy status to sulfonamides status: Secondary | ICD-10-CM | POA: Diagnosis not present

## 2020-02-20 DIAGNOSIS — E1169 Type 2 diabetes mellitus with other specified complication: Secondary | ICD-10-CM | POA: Diagnosis not present

## 2020-02-20 DIAGNOSIS — F319 Bipolar disorder, unspecified: Secondary | ICD-10-CM | POA: Diagnosis present

## 2020-02-20 DIAGNOSIS — Z6838 Body mass index (BMI) 38.0-38.9, adult: Secondary | ICD-10-CM | POA: Diagnosis not present

## 2020-02-20 DIAGNOSIS — I4892 Unspecified atrial flutter: Secondary | ICD-10-CM | POA: Diagnosis present

## 2020-02-20 DIAGNOSIS — I129 Hypertensive chronic kidney disease with stage 1 through stage 4 chronic kidney disease, or unspecified chronic kidney disease: Secondary | ICD-10-CM | POA: Diagnosis present

## 2020-02-20 DIAGNOSIS — G43809 Other migraine, not intractable, without status migrainosus: Secondary | ICD-10-CM

## 2020-02-20 DIAGNOSIS — R7989 Other specified abnormal findings of blood chemistry: Secondary | ICD-10-CM | POA: Diagnosis present

## 2020-02-20 DIAGNOSIS — R0789 Other chest pain: Secondary | ICD-10-CM | POA: Diagnosis not present

## 2020-02-20 HISTORY — DX: Long term (current) use of anticoagulants: Z79.01

## 2020-02-20 HISTORY — DX: Hyperlipidemia, unspecified: E78.5

## 2020-02-20 HISTORY — DX: Chronic kidney disease, stage 3 unspecified: N18.30

## 2020-02-20 LAB — LIPID PANEL
Cholesterol: 196 mg/dL (ref 0–200)
HDL: 39 mg/dL — ABNORMAL LOW (ref >40)
Total CHOL/HDL Ratio: 5 RATIO
Triglycerides: 199 mg/dL — ABNORMAL HIGH (ref <150)
VLDL: 40 mg/dL (ref 0–40)

## 2020-02-20 LAB — ECHOCARDIOGRAM COMPLETE
Height: 66 in
Weight: 3856 oz

## 2020-02-20 LAB — GLUCOSE, CAPILLARY
Glucose-Capillary: 187 mg/dL — ABNORMAL HIGH (ref 70–99)
Glucose-Capillary: 199 mg/dL — ABNORMAL HIGH (ref 70–99)

## 2020-02-20 LAB — BASIC METABOLIC PANEL
Anion gap: 9 (ref 5–15)
BUN: 18 mg/dL (ref 8–23)
CO2: 20 mmol/L — ABNORMAL LOW (ref 22–32)
Calcium: 9 mg/dL (ref 8.9–10.3)
Chloride: 111 mmol/L (ref 98–111)
Creatinine, Ser: 1.68 mg/dL — ABNORMAL HIGH (ref 0.44–1.00)
GFR calc Af Amer: 37 mL/min — ABNORMAL LOW (ref >60)
GFR calc non Af Amer: 32 mL/min — ABNORMAL LOW (ref >60)
Glucose, Bld: 163 mg/dL — ABNORMAL HIGH (ref 70–99)
Potassium: 3.8 mmol/L (ref 3.5–5.1)
Sodium: 140 mmol/L (ref 135–145)

## 2020-02-20 LAB — TROPONIN I (HIGH SENSITIVITY): Troponin I (High Sensitivity): 9 ng/L (ref <18)

## 2020-02-20 LAB — SARS CORONAVIRUS 2 BY RT PCR (HOSPITAL ORDER, PERFORMED IN ~~LOC~~ HOSPITAL LAB): SARS Coronavirus 2: NEGATIVE

## 2020-02-20 LAB — D-DIMER, QUANTITATIVE: D-Dimer, Quant: 0.32 ug/mL-FEU (ref 0.00–0.50)

## 2020-02-20 LAB — HIV ANTIBODY (ROUTINE TESTING W REFLEX): HIV Screen 4th Generation wRfx: NONREACTIVE

## 2020-02-20 MED ORDER — RIZATRIPTAN BENZOATE 10 MG PO TBDP: 10.0000 mg | ORAL_TABLET | Freq: Every day | ORAL | Status: DC | PRN

## 2020-02-20 MED ORDER — RIZATRIPTAN BENZOATE 10 MG PO TBDP
10.0000 mg | ORAL_TABLET | Freq: Every day | ORAL | Status: DC | PRN
  Filled 2020-02-20: qty 1

## 2020-02-20 MED ORDER — TOPIRAMATE 25 MG PO TABS
150.0000 mg | ORAL_TABLET | Freq: Two times a day (BID) | ORAL | Status: DC
  Administered 2020-02-20 – 2020-02-21 (×2): 150 mg via ORAL
  Filled 2020-02-20 (×3): qty 2

## 2020-02-20 MED ORDER — ATORVASTATIN CALCIUM 10 MG PO TABS
20.0000 mg | ORAL_TABLET | Freq: Every day | ORAL | Status: DC
  Administered 2020-02-20: 20 mg via ORAL
  Filled 2020-02-20: qty 2

## 2020-02-20 MED ORDER — QUETIAPINE FUMARATE 50 MG PO TABS
300.0000 mg | ORAL_TABLET | Freq: Every day | ORAL | Status: DC
  Administered 2020-02-20: 300 mg via ORAL
  Filled 2020-02-20: qty 6

## 2020-02-20 MED ORDER — FENOFIBRATE 160 MG PO TABS
160.0000 mg | ORAL_TABLET | Freq: Every day | ORAL | Status: DC
  Administered 2020-02-20 – 2020-02-21 (×2): 160 mg via ORAL
  Filled 2020-02-20 (×2): qty 1

## 2020-02-20 MED ORDER — INSULIN ASPART 100 UNIT/ML ~~LOC~~ SOLN
0.0000 [IU] | Freq: Three times a day (TID) | SUBCUTANEOUS | Status: DC
  Administered 2020-02-20: 2 [IU] via SUBCUTANEOUS
  Administered 2020-02-21: 3 [IU] via SUBCUTANEOUS
  Administered 2020-02-21: 2 [IU] via SUBCUTANEOUS

## 2020-02-20 MED ORDER — DILTIAZEM HCL ER COATED BEADS 120 MG PO CP24
120.0000 mg | ORAL_CAPSULE | Freq: Every day | ORAL | Status: DC
  Administered 2020-02-20 – 2020-02-21 (×2): 120 mg via ORAL
  Filled 2020-02-20 (×2): qty 1

## 2020-02-20 MED ORDER — BUPROPION HCL ER (XL) 150 MG PO TB24
300.0000 mg | ORAL_TABLET | Freq: Every day | ORAL | Status: DC
  Administered 2020-02-20 – 2020-02-21 (×2): 300 mg via ORAL
  Filled 2020-02-20 (×2): qty 2

## 2020-02-20 MED ORDER — BUTALBITAL-APAP-CAFFEINE 50-325-40 MG PO TABS
1.0000 | ORAL_TABLET | Freq: Four times a day (QID) | ORAL | Status: DC
  Administered 2020-02-20 – 2020-02-21 (×2): 1 via ORAL
  Filled 2020-02-20 (×4): qty 1

## 2020-02-20 MED ORDER — INSULIN ASPART 100 UNIT/ML ~~LOC~~ SOLN: 0.0000 [IU] | Freq: Every day | SUBCUTANEOUS | Status: DC

## 2020-02-20 MED ORDER — METOPROLOL SUCCINATE ER 25 MG PO TB24
25.0000 mg | ORAL_TABLET | Freq: Every day | ORAL | Status: DC
  Administered 2020-02-20 – 2020-02-21 (×2): 25 mg via ORAL
  Filled 2020-02-20 (×2): qty 1

## 2020-02-20 MED ORDER — PANTOPRAZOLE SODIUM 40 MG PO TBEC
40.0000 mg | DELAYED_RELEASE_TABLET | Freq: Every day | ORAL | Status: DC
  Administered 2020-02-20 – 2020-02-21 (×2): 40 mg via ORAL
  Filled 2020-02-20 (×2): qty 1

## 2020-02-20 MED ORDER — LURASIDONE HCL 40 MG PO TABS
120.0000 mg | ORAL_TABLET | Freq: Every day | ORAL | Status: DC
  Administered 2020-02-20 – 2020-02-21 (×2): 120 mg via ORAL
  Filled 2020-02-20 (×2): qty 3

## 2020-02-20 MED ORDER — APIXABAN 5 MG PO TABS
5.0000 mg | ORAL_TABLET | Freq: Two times a day (BID) | ORAL | Status: DC
  Administered 2020-02-20 – 2020-02-21 (×3): 5 mg via ORAL
  Filled 2020-02-20: qty 2
  Filled 2020-02-20 (×2): qty 1

## 2020-02-20 MED ORDER — ONDANSETRON HCL 4 MG/2ML IJ SOLN: 4.0000 mg | Freq: Four times a day (QID) | INTRAMUSCULAR | Status: DC | PRN

## 2020-02-20 MED ORDER — ACETAMINOPHEN 325 MG PO TABS
650.0000 mg | ORAL_TABLET | ORAL | Status: DC | PRN
  Administered 2020-02-20: 650 mg via ORAL
  Filled 2020-02-20: qty 2

## 2020-02-20 NOTE — Progress Notes (Signed)
  Echocardiogram 2D Echocardiogram has been performed.  Randa Lynn Kenzlei Runions 02/20/2020, 4:56 PM

## 2020-02-20 NOTE — H&P (Addendum)
History and Physical    Paula Stout MWN:[id number] DOB: 11-11-1955 DOA: 02/19/2020  Referring MD/NP/PA: Mitzi Hansen, MD PCP: [name], DO  Patient coming from: Transfer from [facility]  Chief Complaint: Chest pain  I have personally briefly reviewed patient's old medical records in [place]   HPI: Paula Stout is a 64 y.o. female with medical history significant of atrial fibrillation on chronic anticoagulation, SVT, DM type II, asthma, chronic kidney disease stage III, and migraine headaches presented with complaints of chest pain.  She had recently moved from [facility] and was in the process of establishing care with cardiology.  She had appointment with Dr. Sheilah Mins for 6/25.  Over the last week however she reports that her Apple Watch has been notifying her that she has been going in atrial fibrillation.  Yesterday, she started having severe substernal chest pain that felt like someone punched her.  Reported associated symptoms of feeling dizzy and short of breath.  Her iWatch was unable to read how fast her pulse was going at that time.  Denied having any nausea, vomiting, abdominal pain, diaphoresis, leg swelling, calf pain, weight gain, or loss of consciousness.  She last had a cardiac ablation in 09/27/2018 and was told that she would need another, but this was not done due to Covid.  Patient reports that she has been taking Eliquis as prescribed and not missed any doses.  ED Course: Upon admission into the emergency department patient was noted to be afebrile with blood pressures elevated up to 150/95, and all other vital signs relatively within normal limits.  Initial labs revealed creatinine of 1.99 with BUN 28, glucose 167, and troponins negative x2.  EKG did not note any acute ischemic changes.  Chest x-ray noted low lung volumes without signs of a infiltrate.  Review of Systems  Constitutional: Positive for weight loss (Patient has been trying).  Negative for diaphoresis and fever.  HENT: Negative for congestion and nosebleeds.   Eyes: Negative for pain and discharge.  Respiratory: Positive for shortness of breath.   Cardiovascular: Positive for chest pain. Negative for leg swelling.  Gastrointestinal: Negative for nausea and vomiting.  Genitourinary: Negative for dysuria and hematuria.  Musculoskeletal: Negative for falls and joint pain.  Skin: Negative for itching and rash.  Neurological: Positive for dizziness and headaches. Negative for seizures and loss of consciousness.  Psychiatric/Behavioral: Negative for memory loss and substance abuse.    Past Medical History:  Diagnosis Date  . A-fib ([place])   . Asthma   . Diabetes mellitus without complication ([place])   . Migraine     Past Surgical History:  Procedure Laterality Date  . ABDOMINAL HYSTERECTOMY    . ATRIAL FIBRILLATION ABLATION    . CESAREAN SECTION    . KNEE SURGERY    . ROTATOR CUFF REPAIR       reports that she has never smoked. She has never used smokeless tobacco. She reports that she does not drink alcohol and does not use drugs.  Allergies  Allergen Reactions  . Bee Venom Anaphylaxis  . Asa [Aspirin] Nausea And Vomiting  . Nsaids Nausea And Vomiting  . Sulfa Antibiotics Rash  . Vancomycin Rash    History reviewed. No pertinent family history.  Patient reports being adopted.  Prior to Admission medications   Medication Sig Start Date End Date Taking? Authorizing Provider  albuterol (VENTOLIN HFA) 108 (90 Base) MCG/ACT inhaler Inhale 2 puffs into the lungs every 4 (four) hours. 06/08/19  [provider]  Alogliptin Benzoate 12.5 MG TABS Take 1 tablet by mouth daily. 02/18/20   [name], DO  apixaban (ELIQUIS) 5 MG TABS tablet Take 1 tablet (5 mg total) by mouth in the morning and at bedtime. 02/18/20   [name], DO  atorvastatin (LIPITOR) 20 MG tablet Take 1 tablet (20 mg total) by mouth daily. 02/18/20   [name], DO  buPROPion (WELLBUTRIN XL) 300 MG 24 hr tablet Take 1 tablet (300 mg total) by mouth daily. 02/18/20   [name], DO  butalbital-acetaminophen-caffeine (FIORICET) [id number] MG tablet Take 1 tablet by mouth every 6 (six) hours. 02/18/20   Wendling, Crosby Oyster, DO  diltiazem (CARTIA XT) 120 MG 24 hr capsule Take 1 capsule (120 mg total) by mouth daily. 02/18/20   [name], DO  fenofibrate (TRICOR) 145 MG tablet Take 1 tablet (145 mg total) by mouth daily. 02/18/20   [name], DO  glucose blood (FREESTYLE TEST STRIPS) test strip 1 each by Other route daily. 02/18/20   [name], DO  glucose monitoring kit (FREESTYLE) monitoring kit Use daily to check blood sugar.  DX E11.9 02/18/20   [name], DO  Lancets (FREESTYLE) lancets 1 each by Other route daily. 02/18/20   [name], DO  Lurasidone HCl (LATUDA) 120 MG TABS Take 1 tablet (120 mg total) by mouth daily. 02/18/20   [name], DO  metFORMIN (GLUCOPHAGE) 1000 MG tablet Take 1 tablet (1,000 mg total) by mouth 2 (two) times daily with a meal. 02/18/20   Wendling, Crosby Oyster, DO  metoprolol succinate (TOPROL-XL) 25 MG 24 hr tablet Take 1 tablet (25 mg total) by mouth daily. 02/18/20   [name], DO  nitroGLYCERIN (NITROSTAT) 0.4 MG SL tablet Place 1 tablet (0.4 mg total) under the tongue as needed. 02/18/20   [name], DO  ondansetron (ZOFRAN-ODT) 4 MG disintegrating tablet Take 1 tablet (4 mg total) by mouth every 8 (eight) hours as needed for nausea. 02/18/20   [name], DO  pantoprazole (PROTONIX) 40 MG tablet Take 1 tablet (40 mg total) by mouth daily. 02/18/20   [name], DO  QUEtiapine (SEROQUEL) 300 MG tablet Take 1 tablet (300 mg total) by mouth at bedtime. 02/18/20   [name], DO  rizatriptan (MAXALT-MLT) 10 MG disintegrating tablet Place 1 tablet under the tongue  daily as needed. 01/26/18   [provider]  topiramate (TOPAMAX) 50 MG tablet Take 3 tablets (150 mg total) by mouth in the morning and at bedtime. 02/18/20   [name], DO    Physical Exam:  Constitutional: Obese female currently in no acute distress Vitals:   02/20/20 0445 02/20/20 0458 02/20/20 0528 02/20/20 0641  BP: 138/81 126/74 119/69 134/89  Pulse: 76 65 64 74  Resp: _0 Temp:    97.9 F (36.6 C)  TempSrc:    Oral  SpO2: 97% 96% 96% 98%  Weight:      Height:       Eyes: PERRL, lids and conjunctivae normal ENMT: Mucous membranes are moist. Posterior pharynx clear of any exudate or lesions.   Neck: normal, supple, no masses, no thyromegaly Respiratory: clear to auscultation bilaterally, no wheezing, no crackles. Normal respiratory effort. No accessory muscle use.  Cardiovascular: Regular rate and rhythm, no murmurs / rubs / gallops. No extremity edema. 2+ pedal pulses. No carotid bruits.  Abdomen: no  tenderness, no masses palpated. No hepatosplenomegaly. Bowel sounds positive.  Musculoskeletal: no clubbing / cyanosis. No joint deformity upper and lower extremities. Good ROM, no contractures. Normal muscle tone.  Skin: no rashes, lesions, ulcers. No induration Neurologic: CN 2-12 grossly intact. Sensation intact, DTR normal. Strength 5/5 in all 4.  Psychiatric: Normal judgment and insight. Alert and oriented x 3. Normal mood.     Labs on Admission: I have personally reviewed following labs and imaging studies  CBC: Recent Labs  Lab 02/19/20 1920  WBC 9.4  HGB 12.6  HCT 39.5  MCV 92.3  PLT 142   Basic Metabolic Panel: Recent Labs  Lab 02/19/20 1920  NA 140  K 3.7  CL 108  CO2 20*  GLUCOSE 167*  BUN 20  CREATININE 1.99*  CALCIUM 9.5   GFR: Estimated Creatinine Clearance: 36.6 mL/min (A) (by C-G formula based on SCr of 1.99 mg/dL (H)). Liver Function Tests: No results for input(s): AST, ALT, ALKPHOS, BILITOT, PROT, ALBUMIN  in the last 168 hours. No results for input(s): LIPASE, AMYLASE in the last 168 hours. No results for input(s): AMMONIA in the last 168 hours. Coagulation Profile: No results for input(s): INR, PROTIME in the last 168 hours. Cardiac Enzymes: No results for input(s): CKTOTAL, CKMB, CKMBINDEX, TROPONINI in the last 168 hours. BNP (last 3 results) No results for input(s): PROBNP in the last 8760 hours. HbA1C: No results for input(s): HGBA1C in the last 72 hours. CBG: No results for input(s): GLUCAP in the last 168 hours. Lipid Profile: No results for input(s): CHOL, HDL, LDLCALC, TRIG, CHOLHDL, LDLDIRECT in the last 72 hours. Thyroid Function Tests: No results for input(s): TSH, T4TOTAL, FREET4, T3FREE, THYROIDAB in the last 72 hours. Anemia Panel: No results for input(s): VITAMINB12, FOLATE, FERRITIN, TIBC, IRON, RETICCTPCT in the last 72 hours. Urine analysis: No results found for: COLORURINE, APPEARANCEUR, LABSPEC, PHURINE, GLUCOSEU, HGBUR, BILIRUBINUR, KETONESUR, PROTEINUR, UROBILINOGEN, NITRITE, LEUKOCYTESUR Sepsis Labs: Recent Results (from the past 240 hour(s))  SARS Coronavirus 2 by RT PCR (hospital order, performed in [facility] hospital lab) Nasopharyngeal Nasopharyngeal Swab     Status: None   Collection Time: 02/19/20 11:54 PM   Specimen: Nasopharyngeal Swab  Result Value Ref Range Status   SARS Coronavirus 2 NEGATIVE NEGATIVE Final    Comment: (NOTE) SARS-CoV-2 target nucleic acids are NOT DETECTED.  The SARS-CoV-2 RNA is generally detectable in upper and lower respiratory specimens during the acute phase of infection. The lowest concentration of SARS-CoV-2 viral copies this assay can detect is 250 copies / mL. A negative result does not preclude SARS-CoV-2 infection and should not be used as the sole basis for treatment or other patient management decisions.  A negative result may occur with improper specimen collection / handling, submission of specimen other than  nasopharyngeal swab, presence of viral mutation(s) within the areas targeted by this assay, and inadequate number of viral copies (<250 copies / mL). A negative result must be combined with clinical observations, patient history, and epidemiological information.  Fact Sheet for Patients:   StrictlyIdeas.no  Fact Sheet for Healthcare Providers: BankingDealers.co.za  This test is not yet approved or  cleared by the [place] FDA and has been authorized for detection and/or diagnosis of SARS-CoV-2 by FDA under an Emergency Use Authorization (EUA).  This EUA will remain in effect (meaning this test can be used) for the duration of the COVID-19 declaration under Section 564(b)(1) of the Act, 21 U.S.C. section 360bbb-3(b)(1), unless the authorization is terminated or revoked  sooner.  Performed at [facility], [address] [place], [place] [address]      Radiological Exams on Admission: DG Chest 2 View  Result Date: 02/19/2020 CLINICAL DATA:  Chest pain and central pressure without radiation EXAM: CHEST - 2 VIEW COMPARISON:  None FINDINGS: Slightly low lung volumes with some hazy opacity in the bases favoring atelectasis with central vascular crowding. Focal consolidative opacity. No convincing features of edema. No pneumothorax or effusion. The cardiomediastinal contours are unremarkable. No acute osseous or soft tissue abnormality. Degenerative changes are present in the imaged spine and shoulders. IMPRESSION: Low volumes and atelectasis without other acute cardiopulmonary abnormality. Electronically Signed   By: Lovena Le M.D.   On: 02/19/2020 19:48    EKG: Independently reviewed.  Normal sinus rhythm at 83 bpm  Assessment/Plan Chest pain: Acute.  Patient presented with complaints of substernal chest pain with associated symptoms of shortness of breath and dizziness. Patient was unable to get her Apple Watch read at the  time.  Initial troponins and EKG was unremarkable.  Question possible arrhythmia. -Admit to a cardiac telemetry bed -Check D-dimer and trend troponin(negative D-dimer and negative troponin) -Check echocardiogram -Follow-up telemetry -Cardiology consulted, will follow for further recommendations  Paroxysmal atrial fibrillation on chronic anticoagulation: Patient reported that over the last week he been going into and out of atrial fibrillation reports prior to have ablation in January 2021.  -Continue Eliquis and rate controlling medications  Essential hypertension: Home blood pressure medications include Cardizem 120 mg daily and metoprolol 25 mg daily. -Continue current medications  Diabetes mellitus type 2: On admission blood glucose was initially noted to be elevated up to 167.  Home medications include alogliptin 12.5 mg daily, and Metformin 1000 mg twice daily. -Hypoglycemic protocols -Hold Metformin and alogliptin -CBGs before every meal and at bedtime with sensitive SSI  Chronic kidney disease stage III: Patient baseline creatinine appears to be around 1.6.  On initial check creatinine was elevated at 1.99, but recheck this a.m. was 1.68. -Continue to monitor  Migraine headaches: Patient reports history of migraine headaches for which she is on Topamax 150 mg twice daily. -Continue Topamax and as needed medications  Dyslipidemia: Lipid panel revealed total cholesterol 196, HDL 39, LDL not calculated, and he did not tell me when he was coming triglycerides 199.  Home medications include atorvastatin 20 mg nightly and fenofibrate 145 mg daily. -Continue current home meds  Obesity: 38.90 kg/m2   DVT prophylaxis: Eliquis Code Status:  Full  Disposition Plan: Possible discharge home in 1 to 2 days Consults called: Cardiology Admission status: Inpatient  Norval Morton MD [facility] Pager [phone]   If 7PM-7AM, please contact  night-coverage [email or web address] Password [facility]  02/20/2020, 7:39 AM

## 2020-02-20 NOTE — Telephone Encounter (Signed)
Called the patient informed PA appeal approved.

## 2020-02-20 NOTE — ED Notes (Signed)
Provider at bedside

## 2020-02-20 NOTE — Consult Note (Addendum)
Cardiology Consultation:   Patient ID: Lailyn Appelbaum MRN: [id number]; DOB: [birth year]  Admit date: 02/19/2020 Date of Consult: 02/20/2020  Primary Care Provider: [name], DO [place] Cardiologist: [facility]  Patient Profile:   Monae Topping is a 64 y.o. female with a history of paroxysmal atrial fibrillation/flutter s/p ablation in 09/2018 on Eliquis, hypertriglyceridemia, IBS, migraine, anxiety, depression, and bipolar disorder who is being seen today for the evaluation of chest pain at the request of Dr. Tamala Julian.  History of Present Illness:   Ms. Laubscher is a 64 year old female with the above history. Patient recently moved from [place] to [place] after the death of her husband and mother to be closer to one of her daughters. She has a history of atrial fibrillation/flutter and underwent EP study with ablation in 09/2018. She is maintained on Diltiazem 120mg  PO QD and Toprol 25mg  PO QD.    Patient presented to the [facility] ED on 02/19/2020 for chest pressure which has been intermittent for the last several months. She describes the sympotms as a strong "thump" pressure which is typically present with AF noted of her Apple watch. She also describes what sounds like exertional chest pain that occurs at times while walking around her house or doing other activities. She states she was prescribed SL NTG by her previous cardiologist and states that she has taken this at least 7 times over this course with symptoms relief. She has been also having SOB however no LE edem or orthopnea symptoms. She is adopted and therefore does not know her families PMH. After her AF ablation 09/2018 , she was told that there was another area that needed ablation however this was unable to be performed in the setting of COVID-19. Her cardiologist also was going to refer her for either a stress test or cath however this was also deferred due to [place].   In the ED, EKG performed  which shows NSR with no evidence of AF or other arrhythmias. hsT found to be negative x2. Creatinine  Elevated at 1.63 with unknown baseline. LDL not able to be calculated. She has a hx of DM2 and therefore has several CV risk factors. Echocardiogram performed 02/20/20 with normal LV function, NRWMA and G1DD. There was mild dilation of the ascending aorta at 50mm.   Past Medical History:  Diagnosis Date  . A-fib ([place])   . Anxiety   . Asthma   . Bipolar disorder ([place])   . Depression   . Diabetes mellitus without complication ([place])   . Irritable bowel syndrome (IBS)   . Migraine     Past Surgical History:  Procedure Laterality Date  . ABDOMINAL HYSTERECTOMY    . ATRIAL FIBRILLATION ABLATION    . CARDIAC CATHETERIZATION    . CESAREAN SECTION    . KNEE SURGERY    . ROTATOR CUFF REPAIR         Inpatient Medications: Scheduled Meds: . apixaban  5 mg Oral BID  . atorvastatin  20 mg Oral q1800  . buPROPion  300 mg Oral Daily  . butalbital-acetaminophen-caffeine  1 tablet Oral Q6H  . diltiazem  120 mg Oral Daily  . fenofibrate  160 mg Oral Daily  . insulin aspart  0-5 Units Subcutaneous QHS  . insulin aspart  0-9 Units Subcutaneous TID WC  . lurasidone  120 mg Oral Daily  . metoprolol succinate  25 mg Oral Daily  . pantoprazole  40 mg Oral Daily  . QUEtiapine  300 mg Oral QHS  . topiramate  150 mg Oral BID   Continuous Infusions:  PRN Meds: acetaminophen, nitroGLYCERIN, ondansetron (ZOFRAN) IV, rizatriptan  Allergies:    Allergies  Allergen Reactions  . Bee Pollen Anaphylaxis and Shortness Of Breath    Other reaction(s): Generalized Edema Allergic to bee stings  . Bee Venom Anaphylaxis  . Onion     Other reaction(s): Respiratory Distress  . Sumatriptan Other (See Comments)    Other reaction(s): UNKNOWN Passed out, nose bleed   . Asa [Aspirin] Nausea And Vomiting  . Beeswax   . Nsaids Nausea And Vomiting  . Iodine Rash  . Sulfa Antibiotics Rash  . Vancomycin Rash     Social History:   Social History   Socioeconomic History  . Marital status: Widowed    Spouse name: Not on file  . Number of children: Not on file  . Years of education: Not on file  . Highest education level: Not on file  Occupational History  . Not on file  Tobacco Use  . Smoking status: Never Smoker  . Smokeless tobacco: Never Used  Vaping Use  . Vaping Use: Never used  Substance and Sexual Activity  . Alcohol use: Never  . Drug use: Never  . Sexual activity: Not Currently  Other Topics Concern  . Not on file  Social History Narrative  . Not on file   Social Determinants of Health   Financial Resource Strain:   . Difficulty of Paying Living Expenses:   Food Insecurity:   . Worried About Charity fundraiser in the Last Year:   . Arboriculturist in the Last Year:   Transportation Needs:   . Film/video editor (Medical):   [name] Lack of Transportation (Non-Medical):   Physical Activity:   . Days of Exercise per Week:   . Minutes of Exercise per Session:   Stress:   . Feeling of Stress :   Social Connections:   . Frequency of Communication with Friends and Family:   . Frequency of Social Gatherings with Friends and Family:   . Attends Religious Services:   . Active Member of Clubs or Organizations:   . Attends Archivist Meetings:   [name] Marital Status:   Intimate Partner Violence:   . Fear of Current or Ex-Partner:   . Emotionally Abused:   [name] Physically Abused:   . Sexually Abused:     Family History:    Family History  Adopted: Yes     ROS:  Please see the history of present illness.   All other ROS reviewed and negative.     Physical Exam/Data:   Vitals:   02/20/20 1330 02/20/20 1459 02/20/20 1509 02/20/20 1700  BP: 122/70  119/81 (!) 150/95  Pulse: 71  74 73  Resp: 15  18   Temp:   97.7 F (36.5 C)   TempSrc:   Oral   SpO2: 100%  95%   Weight:  109.3 kg    Height:  5\' 6"  (1.676 m)     No intake or output data in the 24  hours ending 02/20/20 1750 Last 3 Weights 02/20/2020 02/19/2020 02/18/2020  Weight (lbs) 241 lb 240 lb 244 lb 4 oz  Weight (kg) 109.317 kg 108.863 kg 110.791 kg     Body mass index is 38.9 kg/m.   General: Well developed, well nourished, NAD Skin: Warm, dry, intact  Neck: Negative for carotid bruits. No JVD Lungs:Clear to ausculation bilaterally.  No wheezes, rales, or rhonchi. Breathing is unlabored. Cardiovascular: RRR with S1 S2. No murmurs Abdomen: Soft, non-tender, non-distended. No obvious abdominal masses. Extremities: No edema. Radial pulses 2+ bilaterally Neuro: Alert and oriented. No focal deficits. No facial asymmetry. MAE spontaneously. Psych: Responds to questions appropriately with normal affect.     EKG:  The EKG was personally reviewed and demonstrates: 02/20/20 NSR with HR 83bpm and no ST-T wave changes  Telemetry:  Telemetry was personally reviewed and demonstrates: 02/20/20 NSR   Relevant CV Studies:  Obtaining files from previous cardiologist   Laboratory Data:  High Sensitivity Troponin:   Recent Labs  Lab 02/19/20 1920 02/19/20 2140 02/20/20 0754  TROPONINIHS 4 8 9      Chemistry Recent Labs  Lab 02/19/20 1920 02/20/20 0754  NA 140 140  K 3.7 3.8  CL 108 111  CO2 20* 20*  GLUCOSE 167* 163*  BUN 20 18  CREATININE 1.99* 1.68*  CALCIUM 9.5 9.0  GFRNONAA 26* 32*  GFRAA 30* 37*  ANIONGAP 12 9    No results for input(s): PROT, ALBUMIN, AST, ALT, ALKPHOS, BILITOT in the last 168 hours. Hematology Recent Labs  Lab 02/19/20 1920  WBC 9.4  RBC 4.28  HGB 12.6  HCT 39.5  MCV 92.3  MCH 29.4  MCHC 31.9  RDW 14.6  PLT 332   BNPNo results for input(s): BNP, PROBNP in the last 168 hours.  DDimer  Recent Labs  Lab 02/20/20 0754  DDIMER 0.32    Radiology/Studies:  DG Chest 2 View  Result Date: 02/19/2020 CLINICAL DATA:  Chest pain and central pressure without radiation EXAM: CHEST - 2 VIEW COMPARISON:  None FINDINGS: Slightly low lung volumes  with some hazy opacity in the bases favoring atelectasis with central vascular crowding. Focal consolidative opacity. No convincing features of edema. No pneumothorax or effusion. The cardiomediastinal contours are unremarkable. No acute osseous or soft tissue abnormality. Degenerative changes are present in the imaged spine and shoulders. IMPRESSION: Low volumes and atelectasis without other acute cardiopulmonary abnormality. Electronically Signed   By: Lovena Le M.D.   On: 02/19/2020 19:48   ECHOCARDIOGRAM COMPLETE  Result Date: 02/20/2020    ECHOCARDIOGRAM REPORT   Patient Name:   CARMELINA BALDUCCI Date of Exam: 02/20/2020 Medical Rec #:  [id number]       Height:       66.0 in Accession #:    [id number]      Weight:       241.0 lb Date of Birth:  16-Jul-1956       BSA:          2.165 m Patient Age:    22 years        BP:           119/81 mmHg Patient Gender: F               HR:           72 bpm. Exam Location:  Inpatient Procedure: 2D Echo, Cardiac Doppler and Color Doppler Indications:    R07.9* Chest pain, unspecified  History:        Patient has no prior history of Echocardiogram examinations.                 Arrythmias:Atrial Fibrillation; Risk Factors:Diabetes.  Sonographer:    Tiffany Dance Referring Phys: [id number] RONDELL A SMITH IMPRESSIONS  1. Left ventricular ejection fraction, by estimation, is 60 to 65%. The left ventricle has normal function. The left ventricle has  no regional wall motion abnormalities. Left ventricular diastolic parameters are consistent with Grade I diastolic dysfunction (impaired relaxation). Elevated left ventricular end-diastolic pressure.  2. Right ventricular systolic function is normal. The right ventricular size is normal. There is normal pulmonary artery systolic pressure. The estimated right ventricular systolic pressure is 81.2 mmHg.  3. The mitral valve is normal in structure. No evidence of mitral valve regurgitation. No evidence of mitral stenosis.  4. The aortic  valve is normal in structure. Aortic valve regurgitation is not visualized. No aortic stenosis is present.  5. Aortic dilatation noted. There is mild dilatation of the ascending aorta measuring 38 mm.  6. The inferior vena cava is normal in size with greater than 50% respiratory variability, suggesting right atrial pressure of 3 mmHg. FINDINGS  Left Ventricle: Left ventricular ejection fraction, by estimation, is 60 to 65%. The left ventricle has normal function. The left ventricle has no regional wall motion abnormalities. The left ventricular internal cavity size was normal in size. There is  no left ventricular hypertrophy. Left ventricular diastolic parameters are consistent with Grade I diastolic dysfunction (impaired relaxation). Elevated left ventricular end-diastolic pressure. Right Ventricle: The right ventricular size is normal. No increase in right ventricular wall thickness. Right ventricular systolic function is normal. There is normal pulmonary artery systolic pressure. The tricuspid regurgitant velocity is 2.16 m/s, and  with an assumed right atrial pressure of 3 mmHg, the estimated right ventricular systolic pressure is 75.1 mmHg. Left Atrium: Left atrial size was normal in size. Right Atrium: Right atrial size was normal in size. Pericardium: Trivial pericardial effusion is present. The pericardial effusion is circumferential. There is no evidence of cardiac tamponade. Mitral Valve: The mitral valve is normal in structure. Normal mobility of the mitral valve leaflets. No evidence of mitral valve regurgitation. No evidence of mitral valve stenosis. Tricuspid Valve: The tricuspid valve is normal in structure. Tricuspid valve regurgitation is mild . No evidence of tricuspid stenosis. Aortic Valve: The aortic valve is normal in structure. Aortic valve regurgitation is not visualized. No aortic stenosis is present. Pulmonic Valve: The pulmonic valve was normal in structure. Pulmonic valve regurgitation  is trivial. No evidence of pulmonic stenosis. Aorta: Aortic dilatation noted. There is mild dilatation of the ascending aorta measuring 38 mm. Venous: The inferior vena cava is normal in size with greater than 50% respiratory variability, suggesting right atrial pressure of 3 mmHg. IAS/Shunts: No atrial level shunt detected by color flow Doppler.  LEFT VENTRICLE PLAX 2D LVIDd:         4.90 cm  Diastology LVIDs:         3.00 cm  LV e' lateral:   5.55 cm/s LV PW:         1.20 cm  LV E/e' lateral: 12.9 LV IVS:        1.00 cm  LV e' medial:    4.35 cm/s LVOT diam:     2.20 cm  LV E/e' medial:  16.5 LV SV:         94 LV SV Index:   43 LVOT Area:     3.80 cm  RIGHT VENTRICLE             IVC RV Basal diam:  2.90 cm     IVC diam: 1.60 cm RV S prime:     11.70 cm/s TAPSE (M-mode): 2.0 cm LEFT ATRIUM             Index  RIGHT ATRIUM           Index LA diam:        4.10 cm 1.89 cm/m  RA Area:     16.00 cm LA Vol (A2C):   56.9 ml 26.28 ml/m RA Volume:   42.10 ml  19.45 ml/m LA Vol (A4C):   52.4 ml 24.21 ml/m LA Biplane Vol: 55.5 ml 25.64 ml/m  AORTIC VALVE LVOT Vmax:   106.00 cm/s LVOT Vmean:  67.100 cm/s LVOT VTI:    0.247 m  AORTA Ao Root diam: 3.40 cm Ao Asc diam:  3.80 cm MITRAL VALVE               TRICUSPID VALVE MV Area (PHT): 3.77 cm    TR Peak grad:   18.7 mmHg MV Decel Time: 201 msec    TR Vmax:        216.00 cm/s MV E velocity: 71.80 cm/s MV A velocity: 87.30 cm/s  SHUNTS MV E/A ratio:  0.82        Systemic VTI:  0.25 m                            Systemic Diam: 2.20 cm Fransico Him MD Electronically signed by Fransico Him MD Signature Date/Time: 02/20/2020/4:58:11 PM    Final        HEAR Score (for undifferentiated chest pain):  HEAR Score: 4    Assessment and Plan:   1. Chest pain: -Presented to [facility] ED on 02/19/2020 for chest pressure which has been intermittent for the last several months. She describes the sympotms as a strong "thump" pressure which is typically present with AF  noted of her Apple watch. She also describes what sounds like exertional chest pain that occurs at times while walking around her house or doing other activities. She states she was prescribed SL NTG by her previous cardiologist and states that she has taken this at least 7 times over this course with symptoms relief. She has been also having SOB however no LE edem or orthopnea symptoms. She is adopted and therefore does not know her families PMH.  -EKG performed which shows NSR with no evidence of AF or other arrhythmias. hsT found to be negative x2.  -Echocardiogram performed 02/20/20 with normal LV function, NRWMA and G1DD. There was mild dilation of the ascending aorta at 24mm.  -Would recommend further ischemic workup with [facility] tomorrow, will make NPO at [place] -No ASA in the setting of Eliquis   2. Hx of AF s/p ablation 09/2018: -Underwent AF ablation 09/2018 in [place] and was told there was another area that needed ablation however this was unable to be performed in the setting of COVID-19.  -Reports previous chest pain symptoms were in the setting of AF>>more recently patients Apple watch has been showing more episodes of AF with rates at one time in the 170's. She always gets chest pain with this -Maintaining NSR while here -PTA medication include -Would have EP evaluate once CAD ruled out for chest pain  -Continue Diltiazem 120, Toprol 25 -Anticoagulated with Eliquis 5mg  PO BID   3. HTN: -Stable, 150/95>119/81>122/70 -Continue Diltiazem and Toprol   4. HLD: -LDL, found to be uncalculated  -Continue atorvastatin 20>>>up-titrate  -May need referral to lipid clinic in the OP setting   5. DM2: -Obtain HbA1c     For questions or updates, please contact [place] Please consult [email or web address] for contact  info under    Signed, Kathyrn Drown, NP  02/20/2020 5:50 PM   Patient seen and examined.  Agree with above documentation.  Ms. Tatum is a 64 year old female  with a history of paroxysmal atrial fibrillation/flutter who underwent ablation in January 2020, bipolar disorder, HLD, depression, anxiety who is being seen today at the request of Dr. Tamala Julian for chest pain.  Patient underwent ablation in January 2020 and has been on diltiazem 120 mg daily, Toprol-XL 25 mg daily, and Eliquis 5 mg twice daily.  She recently moved to [place] from [place] to be closer to her daughter following the death of her husband.  She presented to the ED in [facility] on 02/19/2020 with chest pain.  Reports chest pain has occurred intermittently for several months.  She notes correlation with atrial fibrillation on her Apple watch.  In addition, she describes chest pain that occurs with exertion while walking around her house.  Reports she takes sublingual nitroglycerin with improvement in her symptoms.  Yesterday she had onset of substernal chest pressure that lasted for about an hour.  HR was up to 170s on watch.  On presentation to the ED, vitals notable for BP 128/80, pulse 82, SPO2 98% on room air.  EKG personally reviewed and shows normal sinus rhythm, rate 83, nonspecific T wave flattening.  Labs notable for high-sensitivity troponin  4 > 8 > 9, creatinine 1.99 > 1.68, hemoglobin 12.6, platelets 332, D-dimer 0.3, Covid negative.  She underwent echocardiogram today, which showed LVEF 60 to 53%, grade 1 diastolic dysfunction, normal RV function, mild ascending aorta dilatation measuring 38 mm, IVC small/collapsible, normal RVSP, no significant valvular disease.  On exam, patient is alert and oriented, regular rate and rhythm, no murmurs, lungs CTAB, no LE edema or JVD.  Telemetry personally reviewed, shows normal sinus rhythm with rate 70s to 80s.  In regards to her chest pain, will plan for ischemia evaluation.  She is not a good coronary CTA candidate given her CKD  (GFR 32).  Will plan Lexiscan Myoview and medical management unless high risk findings.   Donato Heinz, MD

## 2020-02-20 NOTE — Telephone Encounter (Signed)
Appeal Decision received APPROVED--AS OF 02/19/2020.  THE APPROVAL IS FROM 02/19/2020 TO 02/18/2021. Called the patient to inform left message to call back.

## 2020-02-21 ENCOUNTER — Other Ambulatory Visit: Payer: Self-pay | Admitting: Medical

## 2020-02-21 ENCOUNTER — Inpatient Hospital Stay (HOSPITAL_COMMUNITY): Payer: 59

## 2020-02-21 DIAGNOSIS — R079 Chest pain, unspecified: Secondary | ICD-10-CM

## 2020-02-21 DIAGNOSIS — I482 Chronic atrial fibrillation, unspecified: Secondary | ICD-10-CM

## 2020-02-21 DIAGNOSIS — R0789 Other chest pain: Secondary | ICD-10-CM

## 2020-02-21 DIAGNOSIS — N1831 Chronic kidney disease, stage 3a: Secondary | ICD-10-CM

## 2020-02-21 LAB — NM MYOCAR MULTI W/SPECT W/WALL MOTION / EF
Estimated workload: 1 METS
Exercise duration (min): 0 min
Exercise duration (sec): 0 s
MPHR: 158 {beats}/min
Peak HR: 83 {beats}/min
Percent HR: 52 %
Rest HR: 59 {beats}/min

## 2020-02-21 LAB — GLUCOSE, CAPILLARY
Glucose-Capillary: 156 mg/dL — ABNORMAL HIGH (ref 70–99)
Glucose-Capillary: 209 mg/dL — ABNORMAL HIGH (ref 70–99)
Glucose-Capillary: 243 mg/dL — ABNORMAL HIGH (ref 70–99)

## 2020-02-21 LAB — BASIC METABOLIC PANEL
Anion gap: 10 (ref 5–15)
BUN: 20 mg/dL (ref 8–23)
CO2: 20 mmol/L — ABNORMAL LOW (ref 22–32)
Calcium: 9.5 mg/dL (ref 8.9–10.3)
Chloride: 110 mmol/L (ref 98–111)
Creatinine, Ser: 1.73 mg/dL — ABNORMAL HIGH (ref 0.44–1.00)
GFR calc Af Amer: 36 mL/min — ABNORMAL LOW (ref >60)
GFR calc non Af Amer: 31 mL/min — ABNORMAL LOW (ref >60)
Glucose, Bld: 170 mg/dL — ABNORMAL HIGH (ref 70–99)
Potassium: 3.7 mmol/L (ref 3.5–5.1)
Sodium: 140 mmol/L (ref 135–145)

## 2020-02-21 MED ORDER — TECHNETIUM TC 99M TETROFOSMIN IV KIT
10.0000 | PACK | Freq: Once | INTRAVENOUS | Status: AC | PRN
  Administered 2020-02-21: 10 via INTRAVENOUS

## 2020-02-21 MED ORDER — ATORVASTATIN CALCIUM 80 MG PO TABS: 80.0000 mg | ORAL_TABLET | Freq: Every day | ORAL | Status: DC

## 2020-02-21 MED ORDER — TECHNETIUM TC 99M TETROFOSMIN IV KIT
31.2000 | PACK | Freq: Once | INTRAVENOUS | Status: AC | PRN
  Administered 2020-02-21: 31.2 via INTRAVENOUS

## 2020-02-21 MED ORDER — FENOFIBRATE 160 MG PO TABS: 160.0000 mg | ORAL_TABLET | Freq: Every day | ORAL | 0 refills | Status: DC

## 2020-02-21 MED ORDER — ATORVASTATIN CALCIUM 80 MG PO TABS: 80.0000 mg | ORAL_TABLET | Freq: Every day | ORAL | 1 refills | Status: DC

## 2020-02-21 MED ORDER — REGADENOSON 0.4 MG/5ML IV SOLN
INTRAVENOUS | Status: AC
  Administered 2020-02-21: 0.4 mg via INTRAVENOUS
  Filled 2020-02-21: qty 5

## 2020-02-21 MED ORDER — REGADENOSON 0.4 MG/5ML IV SOLN: 0.4000 mg | Freq: Once | INTRAVENOUS | Status: AC

## 2020-02-21 NOTE — Progress Notes (Addendum)
Progress Note  Patient Name: Paula Stout Date of Encounter: 02/21/2020  Primary Cardiologist: New to [facility]; scheduled to see Dr. Geraldo Pitter 02/29/20 to establish care  Subjective   Recently moved from [place] to [facility] to live with her daughter. Still having chest pain this morning. Pressure sensation. Seems fairly constant. Does have intermittent episodes of atrial fibrillation 2-3 times per week which lasts from 16min-1 hour and is associated with a different kind of chest pain. She reports having an ablation 09/2018 and was scheduled for a second procedure to ablate another pathway, though this was never completed due to [place]. She was also scheduled for a NST early 2020, and that too was never completed due to [place]. No complaints of SOB or palpitations this admission.   Inpatient Medications    Scheduled Meds:  apixaban  5 mg Oral BID   atorvastatin  20 mg Oral q1800   buPROPion  300 mg Oral Daily   butalbital-acetaminophen-caffeine  1 tablet Oral Q6H   diltiazem  120 mg Oral Daily   fenofibrate  160 mg Oral Daily   insulin aspart  0-5 Units Subcutaneous QHS   insulin aspart  0-9 Units Subcutaneous TID WC   lurasidone  120 mg Oral Daily   metoprolol succinate  25 mg Oral Daily   pantoprazole  40 mg Oral Daily   QUEtiapine  300 mg Oral QHS   topiramate  150 mg Oral BID   Continuous Infusions:  PRN Meds: acetaminophen, nitroGLYCERIN, ondansetron (ZOFRAN) IV, rizatriptan   Vital Signs    Vitals:   02/20/20 1910 02/20/20 2106 02/21/20 0010 02/21/20 0439  BP:  121/78 (!) 84/56 (!) 92/57  Pulse: 73 66 78 62  Resp:  17 16 15   Temp:  98.5 F (36.9 C) 98.5 F (36.9 C) 98.2 F (36.8 C)  TempSrc:  Oral Oral Oral  SpO2: 97% 98% 96% 97%  Weight:    108.7 kg  Height:        Intake/Output Summary (Last 24 hours) at 02/21/2020 0832 Last data filed at 02/20/2020 2121 Gross per 24 hour  Intake 240 ml  Output --  Net 240 ml   Filed Weights   02/19/20  1902 02/20/20 1459 02/21/20 0439  Weight: 108.9 kg 109.3 kg 108.7 kg    Telemetry    NSR - Personally Reviewed  ECG    NSR, rate 59 bpm, no STE/D, no TWI.  - Personally Reviewed  Physical Exam   GEN: No acute distress.   Neck: No JVD, no carotid bruits Cardiac: RRR, no murmurs, rubs, or gallops.  Respiratory: Clear to auscultation bilaterally, no wheezes/ rales/ rhonchi GI: NABS, Soft, obese, nontender, non-distended  MS: No edema; No deformity. Neuro:  Nonfocal, moving all extremities spontaneously Psych: Normal affect   Labs    Chemistry Recent Labs  Lab 02/19/20 1920 02/20/20 0754 02/21/20 0321  NA 140 140 140  K 3.7 3.8 3.7  CL 108 111 110  CO2 20* 20* 20*  GLUCOSE 167* 163* 170*  BUN 20 18 20   CREATININE 1.99* 1.68* 1.73*  CALCIUM 9.5 9.0 9.5  GFRNONAA 26* 32* 31*  GFRAA 30* 37* 36*  ANIONGAP 12 9 10      Hematology Recent Labs  Lab 02/19/20 1920  WBC 9.4  RBC 4.28  HGB 12.6  HCT 39.5  MCV 92.3  MCH 29.4  MCHC 31.9  RDW 14.6  PLT 332    Cardiac EnzymesNo results for input(s): TROPONINI in the last 168 hours.  No results for input(s): TROPIPOC in the last 168 hours.   BNPNo results for input(s): BNP, PROBNP in the last 168 hours.   DDimer  Recent Labs  Lab 02/20/20 0754  DDIMER 0.32     Radiology    DG Chest 2 View  Result Date: 02/19/2020 CLINICAL DATA:  Chest pain and central pressure without radiation EXAM: CHEST - 2 VIEW COMPARISON:  None FINDINGS: Slightly low lung volumes with some hazy opacity in the bases favoring atelectasis with central vascular crowding. Focal consolidative opacity. No convincing features of edema. No pneumothorax or effusion. The cardiomediastinal contours are unremarkable. No acute osseous or soft tissue abnormality. Degenerative changes are present in the imaged spine and shoulders. IMPRESSION: Low volumes and atelectasis without other acute cardiopulmonary abnormality. Electronically Signed   By: Lovena Le  M.D.   On: 02/19/2020 19:48   ECHOCARDIOGRAM COMPLETE  Result Date: 02/20/2020    ECHOCARDIOGRAM REPORT   Patient Name:   Paula Stout Date of Exam: 02/20/2020 Medical Rec #:  [id number]       Height:       66.0 in Accession #:    [id number]      Weight:       241.0 lb Date of Birth:  03-03-1956       BSA:          2.165 m Patient Age:    64 years        BP:           119/81 mmHg Patient Gender: F               HR:           72 bpm. Exam Location:  Inpatient Procedure: 2D Echo, Cardiac Doppler and Color Doppler Indications:    R07.9* Chest pain, unspecified  History:        Patient has no prior history of Echocardiogram examinations.                 Arrythmias:Atrial Fibrillation; Risk Factors:Diabetes.  Sonographer:    Tiffany Dance Referring Phys: [id number] RONDELL A SMITH IMPRESSIONS  1. Left ventricular ejection fraction, by estimation, is 60 to 65%. The left ventricle has normal function. The left ventricle has no regional wall motion abnormalities. Left ventricular diastolic parameters are consistent with Grade I diastolic dysfunction (impaired relaxation). Elevated left ventricular end-diastolic pressure.  2. Right ventricular systolic function is normal. The right ventricular size is normal. There is normal pulmonary artery systolic pressure. The estimated right ventricular systolic pressure is 02.7 mmHg.  3. The mitral valve is normal in structure. No evidence of mitral valve regurgitation. No evidence of mitral stenosis.  4. The aortic valve is normal in structure. Aortic valve regurgitation is not visualized. No aortic stenosis is present.  5. Aortic dilatation noted. There is mild dilatation of the ascending aorta measuring 38 mm.  6. The inferior vena cava is normal in size with greater than 50% respiratory variability, suggesting right atrial pressure of 3 mmHg. FINDINGS  Left Ventricle: Left ventricular ejection fraction, by estimation, is 60 to 65%. The left ventricle has normal function. The left  ventricle has no regional wall motion abnormalities. The left ventricular internal cavity size was normal in size. There is  no left ventricular hypertrophy. Left ventricular diastolic parameters are consistent with Grade I diastolic dysfunction (impaired relaxation). Elevated left ventricular end-diastolic pressure. Right Ventricle: The right ventricular size is normal. No increase in right ventricular wall thickness. Right ventricular systolic  function is normal. There is normal pulmonary artery systolic pressure. The tricuspid regurgitant velocity is 2.16 m/s, and  with an assumed right atrial pressure of 3 mmHg, the estimated right ventricular systolic pressure is 34.1 mmHg. Left Atrium: Left atrial size was normal in size. Right Atrium: Right atrial size was normal in size. Pericardium: Trivial pericardial effusion is present. The pericardial effusion is circumferential. There is no evidence of cardiac tamponade. Mitral Valve: The mitral valve is normal in structure. Normal mobility of the mitral valve leaflets. No evidence of mitral valve regurgitation. No evidence of mitral valve stenosis. Tricuspid Valve: The tricuspid valve is normal in structure. Tricuspid valve regurgitation is mild . No evidence of tricuspid stenosis. Aortic Valve: The aortic valve is normal in structure. Aortic valve regurgitation is not visualized. No aortic stenosis is present. Pulmonic Valve: The pulmonic valve was normal in structure. Pulmonic valve regurgitation is trivial. No evidence of pulmonic stenosis. Aorta: Aortic dilatation noted. There is mild dilatation of the ascending aorta measuring 38 mm. Venous: The inferior vena cava is normal in size with greater than 50% respiratory variability, suggesting right atrial pressure of 3 mmHg. IAS/Shunts: No atrial level shunt detected by color flow Doppler.  LEFT VENTRICLE PLAX 2D LVIDd:         4.90 cm  Diastology LVIDs:         3.00 cm  LV e' lateral:   5.55 cm/s LV PW:         1.20  cm  LV E/e' lateral: 12.9 LV IVS:        1.00 cm  LV e' medial:    4.35 cm/s LVOT diam:     2.20 cm  LV E/e' medial:  16.5 LV SV:         94 LV SV Index:   43 LVOT Area:     3.80 cm  RIGHT VENTRICLE             IVC RV Basal diam:  2.90 cm     IVC diam: 1.60 cm RV S prime:     11.70 cm/s TAPSE (M-mode): 2.0 cm LEFT ATRIUM             Index       RIGHT ATRIUM           Index LA diam:        4.10 cm 1.89 cm/m  RA Area:     16.00 cm LA Vol (A2C):   56.9 ml 26.28 ml/m RA Volume:   42.10 ml  19.45 ml/m LA Vol (A4C):   52.4 ml 24.21 ml/m LA Biplane Vol: 55.5 ml 25.64 ml/m  AORTIC VALVE LVOT Vmax:   106.00 cm/s LVOT Vmean:  67.100 cm/s LVOT VTI:    0.247 m  AORTA Ao Root diam: 3.40 cm Ao Asc diam:  3.80 cm MITRAL VALVE               TRICUSPID VALVE MV Area (PHT): 3.77 cm    TR Peak grad:   18.7 mmHg MV Decel Time: 201 msec    TR Vmax:        216.00 cm/s MV E velocity: 71.80 cm/s MV A velocity: 87.30 cm/s  SHUNTS MV E/A ratio:  0.82        Systemic VTI:  0.25 m                            Systemic Diam: 2.20 cm  Fransico Him MD Electronically signed by Fransico Him MD Signature Date/Time: 02/20/2020/4:58:11 PM    Final     Cardiac Studies   Echocardiogram 02/20/20: 1. Left ventricular ejection fraction, by estimation, is 60 to 65%. The  left ventricle has normal function. The left ventricle has no regional  wall motion abnormalities. Left ventricular diastolic parameters are  consistent with Grade I diastolic  dysfunction (impaired relaxation). Elevated left ventricular end-diastolic  pressure.  2. Right ventricular systolic function is normal. The right ventricular  size is normal. There is normal pulmonary artery systolic pressure. The  estimated right ventricular systolic pressure is 81.1 mmHg.  3. The mitral valve is normal in structure. No evidence of mitral valve  regurgitation. No evidence of mitral stenosis.  4. The aortic valve is normal in structure. Aortic valve regurgitation is  not  visualized. No aortic stenosis is present.  5. Aortic dilatation noted. 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  6. The inferior vena cava is normal in size with greater than 50%  respiratory variability, suggesting right atrial pressure of 3 mmHg.   Cardiac catheterization in 2015: CORONARY ANGIOGRAPHY:  Left main coronary artery is a large caliber vessel which is ectatic and  trifurcates togive rise to the left anterior descending artery,  circumflex coronary artery and ramus intermedius. Ramus intermedius is  a large caliber vessel yet tortuous which rapidly tapers and bifurcates  with no evidence of disease. The LAD (left anterior descending) is a  large caliber vessel which has mild luminal irregularities and goes  towards the apex of the heart.   Circumflex coronary artery, is a large caliber dominant vessel, gives  rise to a large obtuse marginal branch. Distally it is tortuous and  then give rise to a posterior descending artery with some sluggish  high-flow. Right coronary artery is a medium caliber nondominant vessel  which terminates after giving rise to an RV (right ventricular) branch.   LEFT VENTRICULOGRAPHY:  There isnormal left ventricular ejection fraction of 65%. No regional  wall motion abnormality. Aortic root cineangiography demonstrates no  dilatation of ascending aorta.    IMPRESSION:   Miss Choi is presenting with recurrent chest pain with false positive  myocardial perfusion imaging. Coronary angiography demonstrated  preserved left ventricular systolic function with no regional wall  motion abnormality with no evidence of significant luminal disease.  Suggest continue risk factor modification.     Patient Profile     64 y.o. female with PMH of paroxysmal atrial fibrillation/flutter s/p ablation 09/2018, hypertriglyceridemia, IBS, migraines, anxiety, and bipolar disorder, who is being followed by cardiology for  chest pain.   NST 2015: FINDINGS:   There is a defect in the anteroapical region at stress which is not  present at rest, consistent with moderate ischemia. No other  perfusion abnormality is evident. Left ventricular cavity size is  normal. The ejection fraction is normal at 67%.    Assessment & Plan    1. Chest pain in patient with no known CAD: patient presented with intermittent chest pain for the past several months, often in the presence of PAF noted on her apple watch, though sometimes is exertional in nature which has been relieved with SL nitro. EKG was non-ischemic. HsTrop negative. Echo this admission with EF 60-65%, G1DD, elevated LVEDP, no significant valvular abnormalities, and ascending aortic aneurysm of 67mm. Not a good candidate for a coronary CTA due to underlying CKD. Not on aspirin given need for anticoagulation.  Last stress test was ~5 yr ago and suspected normal. Found a LHC in [place] from 2015 to evaluate a positive NST which showed no evidence of significant luminal disease at that time.  - Will f/u NST to further evaluate for ischemia.  - Will increase atorvastatin to 80mg  daily for aggressive risk factor modifications given uncalculable LDL and triglycerides of 199  2. Paroxysmal atrial fibrillation/flutter s/p ablation 09/2018: this occurred in [place]. She reports intermittent episodes noted on her apple watch which often correlates with chest pressure. Occurs 2-3 times per week lasting 33min-1 hour. HR has been up to 180s at times. She has maintained NSR this admission.  - Could consider a heart monitor at discharge to further evaluate frequency and duration of Afib.  - Would likely benefit from EP follow-up outpatient to discuss antiarrhythmic options vs repeat ablation - Continue diltiazem and metoprolol succinate for rate control - Continue eliquis for stroke ppx  3. HTN: BP is somewhat labile. Appears to be low in the mornings.  - Continue  diltiazem and metoprolol succinate for now  4. HLD: uncalculable LDL and triglycerides of 199 on lipids this admission.  - Will increase atorvastatin to 80mg  daily for aggressive risk factor modifications.   5. DM type 2: A1C is pending.  - Continue management per primary team  6. CKD stage 3: Cr 1.99 on admission, down to 1.73 today - suspect this may be baseline. Last Cr 1.6 09/2018 in Care Everywhere - Continue to monitor.  For questions or updates, please contact [place] Please consult [email or web address] for contact info under Cardiology/STEMI.      Signed, Abigail Butts, PA-C  02/21/2020, 8:32 AM   [phone]  Personally seen and examined. Agree with above.   Overall reassuring stress test with low risk findings no ischemia. I agree with Zio patch monitor for 14 days to evaluate frequency and duration of atrial fibrillation. Follow-up with EP to discuss further options. Okay with discharge home.  Candee Furbish, MD

## 2020-02-21 NOTE — Progress Notes (Signed)
   Renie Ora presented for a nuclear stress test today.  No immediate complications.  Stress imaging is pending at this time.  Preliminary EKG findings may be listed in the chart, but the stress test result will not be finalized until perfusion imaging is complete.   Abigail Butts, PA-C 02/21/2020, 10:36 AM

## 2020-02-21 NOTE — Discharge Summary (Signed)
Physician Discharge Summary  Paula Stout JQG:[id number] DOB: [birth year] DOA: 02/19/2020  PCP: [name], DO  Admit date: 02/19/2020 Discharge date: 02/21/2020  Admitted From: [facility].  Disposition:  Home.   Recommendations for Outpatient Follow-up:  1. Follow up with PCP in 1-2 weeks 2. Please obtain BMP/CBC in one week 3. We have stopped metformin due to elevated creatinine.  4. Please follow up with PCP regarding DM medications.  5. Please follow up with EP as scheduled.     Discharge Condition:stable.  CODE STATUS: full code.  Diet recommendation: Heart Healthy   Brief/Interim Summary: Paula Stout is a 64 y.o. female with medical history significant of atrial fibrillation on chronic anticoagulation, SVT, DM type II, asthma, chronic kidney disease stage III, and migraine headaches presented with complaints of chest pain.  She had recently moved from [facility] and was in the process of establishing care with cardiology.  She had appointment with Dr. Sheilah Mins for 6/25.  Over the last week however she reports that her Apple Watch has been notifying her that she has been going in atrial fibrillation.  Yesterday, she started having severe substernal chest pain that felt like someone punched her.  Reported associated symptoms of feeling dizzy and short of breath. EKG did not note any acute ischemic changes.  Chest x-ray noted low lung volumes without signs of a infiltrate.  Discharge Diagnoses:  Principal Problem:   Chest pain Active Problems:   Diabetes mellitus type 2 in obese (HCC)   Migraine   AF (paroxysmal atrial fibrillation) (HCC)   Dyslipidemia   Chronic anticoagulation   CKD (chronic kidney disease), stage III  Atypical chest pain:  Probably brought on by ? Atrial fibrillation.  ACS is ruled out.  Stress Myoview is low risk.  Appreciate cardiology recommendations to follow up with EP as outpatient to see if she needs ablation.  Negative d dimer.   No chest pain today on discharge.    Essential hypertension  Well controlled.    DM:  Hold metformin due to elevated creatinine.  Resume the rest of the home meds.     Stage 3a CKD:  Creatinine at baseline.     Hyperlipidemia:  Cardiology increased the lipitor to 80 mg daily, fenofibrate to 160 mg daily.    Body mass index is 38.69 kg/m. Obesity.  Recommend outpatient follow up with PCP.    PAF On anti coagulation.  Rate controlled.  Echocardiogram shows Left ventricular ejection fraction, by estimation, is 60 to 65%. The left ventricle has normal function. The left ventricle has no regional wall motion abnormalities. Left ventricular diastolic parameters are consistent with Grade I diastolic dysfunction (impaired relaxation). Elevated left ventricular end-diastolic pressure   Discharge Instructions  Discharge Instructions    Diet - low sodium heart healthy   Complete by: As directed    Discharge instructions   Complete by: As directed    Please follow up with cardiology/ EP as recommended.  Please follow up with PCP in one week.     Allergies as of 02/21/2020      Reactions   Bee Pollen Anaphylaxis, Shortness Of Breath   Other reaction(s): Generalized Edema Allergic to bee stings   Bee Venom Anaphylaxis   Onion    Other reaction(s): Respiratory Distress   Sumatriptan Other (See Comments)   Other reaction(s): UNKNOWN Passed out, nose bleed   Asa [aspirin] Nausea And Vomiting   Beeswax    Nsaids Nausea And Vomiting   Iodine Rash  Sulfa Antibiotics Rash   Vancomycin Rash      Medication List    TAKE these medications   albuterol 108 (90 Base) MCG/ACT inhaler Commonly known as: VENTOLIN HFA Inhale 2 puffs into the lungs every 4 (four) hours.   Alogliptin Benzoate 12.5 MG Tabs Take 1 tablet by mouth daily.   apixaban 5 MG Tabs tablet Commonly known as: ELIQUIS Take 1 tablet (5 mg total) by mouth in the morning and at bedtime.   atorvastatin  80 MG tablet Commonly known as: LIPITOR Take 1 tablet (80 mg total) by mouth daily at 6 PM. What changed:   medication strength  how much to take  when to take this   buPROPion 300 MG 24 hr tablet Commonly known as: WELLBUTRIN XL Take 1 tablet (300 mg total) by mouth daily.   butalbital-acetaminophen-caffeine 50-325-40 MG tablet Commonly known as: FIORICET Take 1 tablet by mouth every 6 (six) hours.   diltiazem 120 MG 24 hr capsule Commonly known as: Cartia XT Take 1 capsule (120 mg total) by mouth daily.   fenofibrate 160 MG tablet Take 1 tablet (160 mg total) by mouth daily. Start taking on: February 22, 2020 What changed:   medication strength  how much to take   freestyle lancets 1 each by Other route daily.   FREESTYLE TEST STRIPS test strip Generic drug: glucose blood 1 each by Other route daily.   glucose monitoring kit monitoring kit Use daily to check blood sugar.  DX E11.9   Latuda 120 MG Tabs Generic drug: Lurasidone HCl Take 1 tablet (120 mg total) by mouth daily.   metFORMIN 1000 MG tablet Commonly known as: GLUCOPHAGE Take 1 tablet (1,000 mg total) by mouth 2 (two) times daily with a meal.   metoprolol succinate 25 MG 24 hr tablet Commonly known as: TOPROL-XL Take 1 tablet (25 mg total) by mouth daily.   nitroGLYCERIN 0.4 MG SL tablet Commonly known as: NITROSTAT Place 1 tablet (0.4 mg total) under the tongue as needed.   ondansetron 4 MG disintegrating tablet Commonly known as: ZOFRAN-ODT Take 1 tablet (4 mg total) by mouth every 8 (eight) hours as needed for nausea.   pantoprazole 40 MG tablet Commonly known as: PROTONIX Take 1 tablet (40 mg total) by mouth daily.   QUEtiapine 300 MG tablet Commonly known as: SEROquel Take 1 tablet (300 mg total) by mouth at bedtime.   rizatriptan 10 MG disintegrating tablet Commonly known as: MAXALT-MLT Place 1 tablet under the tongue daily as needed.   topiramate 50 MG tablet Commonly known as:  TOPAMAX Take 3 tablets (150 mg total) by mouth in the morning and at bedtime.       Allergies  Allergen Reactions  . Bee Pollen Anaphylaxis and Shortness Of Breath    Other reaction(s): Generalized Edema Allergic to bee stings  . Bee Venom Anaphylaxis  . Onion     Other reaction(s): Respiratory Distress  . Sumatriptan Other (See Comments)    Other reaction(s): UNKNOWN Passed out, nose bleed   . Asa [Aspirin] Nausea And Vomiting  . Beeswax   . Nsaids Nausea And Vomiting  . Iodine Rash  . Sulfa Antibiotics Rash  . Vancomycin Rash    Consultations:  Cardiology.    Procedures/Studies: DG Chest 2 View  Result Date: 02/19/2020 CLINICAL DATA:  Chest pain and central pressure without radiation EXAM: CHEST - 2 VIEW COMPARISON:  None FINDINGS: Slightly low lung volumes with some hazy opacity in the bases favoring atelectasis  with central vascular crowding. Focal consolidative opacity. No convincing features of edema. No pneumothorax or effusion. The cardiomediastinal contours are unremarkable. No acute osseous or soft tissue abnormality. Degenerative changes are present in the imaged spine and shoulders. IMPRESSION: Low volumes and atelectasis without other acute cardiopulmonary abnormality. Electronically Signed   By: Lovena Le M.D.   On: 02/19/2020 19:48   NM Myocar Multi W/Spect W/Wall Motion / EF  Result Date: 02/21/2020 CLINICAL DATA:  Chest pain on exertion. History of atrial fibrillation. Chronic renal disease. 64 year old female. EXAM: MYOCARDIAL IMAGING WITH SPECT (REST AND PHARMACOLOGIC-STRESS) GATED LEFT VENTRICULAR WALL MOTION STUDY LEFT VENTRICULAR EJECTION FRACTION TECHNIQUE: Standard myocardial SPECT imaging was performed after resting intravenous injection of 10 mCi Tc-31mtetrofosmin. Subsequently, intravenous infusion of Lexiscan was performed under the supervision of the Cardiology staff. At peak effect of the drug, 30 mCi Tc-949metrofosmin was injected  intravenously and standard myocardial SPECT imaging was performed. Quantitative gated imaging was also performed to evaluate left ventricular wall motion, and estimate left ventricular ejection fraction. COMPARISON:  None. FINDINGS: Perfusion: Small region of mild decreased perfusion in the apical segment of the anterior septal wall which improved from stress to rest. No additional evidence reversible ischemia or infarction. Wall Motion: Normal left ventricular wall motion. No left ventricular dilation. Left Ventricular Ejection Fraction: 66 % End diastolic volume 10536l End systolic volume 37 ml IMPRESSION: 1. Small focus ischemia versus variable attenuation in the anteroseptal wall. 2. Normal left ventricular wall motion. 3. Left ventricular ejection fraction 66% 4. Non invasive risk stratification*: Low *2012 Appropriate Use Criteria for Coronary Revascularization Focused Update: J Am Coll Cardiol. 206440;34(7):[id number]://content.[email or web address]?articleid=[email or web address] Electronically Signed   By: StSuzy Bouchard.D.   On: 02/21/2020 12:27   ECHOCARDIOGRAM COMPLETE  Result Date: 02/20/2020    ECHOCARDIOGRAM REPORT   Patient Name:   THHERMIONE HAVLICEKate of Exam: 02/20/2020 Medical Rec #:  [id number]     Height:       66.0 in Accession #:    [id number]    Weight:       241.0 lb Date of Birth:  [birth year]     BSA:          2.165 m Patient Age:    [age]        BP:           119/81 mmHg Patient Gender: F               HR:           72 bpm. Exam Location:  Inpatient Procedure: 2D Echo, Cardiac Doppler and Color Doppler Indications:    R07.9* Chest pain, unspecified  History:        Patient has no prior history of Echocardiogram examinations.                 Arrythmias:Atrial Fibrillation; Risk Factors:Diabetes.  Sonographer:    Tiffany Dance Referring Phys: [id number] A SMITH IMPRESSIONS  1. Left ventricular ejection fraction, by estimation, is 60 to 65%. The left ventricle has normal  function. The left ventricle has no regional wall motion abnormalities. Left ventricular diastolic parameters are consistent with Grade I diastolic dysfunction (impaired relaxation). Elevated left ventricular end-diastolic pressure.  2. Right ventricular systolic function is normal. The right ventricular size is normal. There is normal pulmonary artery systolic pressure. The estimated right ventricular systolic pressure is 2109.3mHg.  3. The mitral valve is normal in structure. No  evidence of mitral valve regurgitation. No evidence of mitral stenosis.  4. The aortic valve is normal in structure. Aortic valve regurgitation is not visualized. No aortic stenosis is present.  5. Aortic dilatation noted. There is mild dilatation of the ascending aorta measuring 38 mm.  6. The inferior vena cava is normal in size with greater than 50% respiratory variability, suggesting right atrial pressure of 3 mmHg. FINDINGS  Left Ventricle: Left ventricular ejection fraction, by estimation, is 60 to 65%. The left ventricle has normal function. The left ventricle has no regional wall motion abnormalities. The left ventricular internal cavity size was normal in size. There is  no left ventricular hypertrophy. Left ventricular diastolic parameters are consistent with Grade I diastolic dysfunction (impaired relaxation). Elevated left ventricular end-diastolic pressure. Right Ventricle: The right ventricular size is normal. No increase in right ventricular wall thickness. Right ventricular systolic function is normal. There is normal pulmonary artery systolic pressure. The tricuspid regurgitant velocity is 2.16 m/s, and  with an assumed right atrial pressure of 3 mmHg, the estimated right ventricular systolic pressure is 35.4 mmHg. Left Atrium: Left atrial size was normal in size. Right Atrium: Right atrial size was normal in size. Pericardium: Trivial pericardial effusion is present. The pericardial effusion is circumferential. There is  no evidence of cardiac tamponade. Mitral Valve: The mitral valve is normal in structure. Normal mobility of the mitral valve leaflets. No evidence of mitral valve regurgitation. No evidence of mitral valve stenosis. Tricuspid Valve: The tricuspid valve is normal in structure. Tricuspid valve regurgitation is mild . No evidence of tricuspid stenosis. Aortic Valve: The aortic valve is normal in structure. Aortic valve regurgitation is not visualized. No aortic stenosis is present. Pulmonic Valve: The pulmonic valve was normal in structure. Pulmonic valve regurgitation is trivial. No evidence of pulmonic stenosis. Aorta: Aortic dilatation noted. There is mild dilatation of the ascending aorta measuring 38 mm. Venous: The inferior vena cava is normal in size with greater than 50% respiratory variability, suggesting right atrial pressure of 3 mmHg. IAS/Shunts: No atrial level shunt detected by color flow Doppler.  LEFT VENTRICLE PLAX 2D LVIDd:         4.90 cm  Diastology LVIDs:         3.00 cm  LV e' lateral:   5.55 cm/s LV PW:         1.20 cm  LV E/e' lateral: 12.9 LV IVS:        1.00 cm  LV e' medial:    4.35 cm/s LVOT diam:     2.20 cm  LV E/e' medial:  16.5 LV SV:         94 LV SV Index:   43 LVOT Area:     3.80 cm  RIGHT VENTRICLE             IVC RV Basal diam:  2.90 cm     IVC diam: 1.60 cm RV S prime:     11.70 cm/s TAPSE (M-mode): 2.0 cm LEFT ATRIUM             Index       RIGHT ATRIUM           Index LA diam:        4.10 cm 1.89 cm/m  RA Area:     16.00 cm LA Vol (A2C):   56.9 ml 26.28 ml/m RA Volume:   42.10 ml  19.45 ml/m LA Vol (A4C):   52.4 ml 24.21 ml/m LA Biplane  Vol: 55.5 ml 25.64 ml/m  AORTIC VALVE LVOT Vmax:   106.00 cm/s LVOT Vmean:  67.100 cm/s LVOT VTI:    0.247 m  AORTA Ao Root diam: 3.40 cm Ao Asc diam:  3.80 cm MITRAL VALVE               TRICUSPID VALVE MV Area (PHT): 3.77 cm    TR Peak grad:   18.7 mmHg MV Decel Time: 201 msec    TR Vmax:        216.00 cm/s MV E velocity: 71.80 cm/s MV A  velocity: 87.30 cm/s  SHUNTS MV E/A ratio:  0.82        Systemic VTI:  0.25 m                            Systemic Diam: 2.20 cm Fransico Him MD Electronically signed by Fransico Him MD Signature Date/Time: 02/20/2020/4:58:11 PM    Final        Subjective: No chest pain or sob today.   Discharge Exam: Vitals:   02/21/20 1023 02/21/20 1214  BP: 138/77 112/65  Pulse:  65  Resp:  19  Temp:  97.9 F (36.6 C)  SpO2:  97%   Vitals:   02/21/20 1020 02/21/20 1022 02/21/20 1023 02/21/20 1214  BP: (!) 151/77 (!) 146/77 138/77 112/65  Pulse:    65  Resp:    19  Temp:    97.9 F (36.6 C)  TempSrc:    Oral  SpO2:    97%  Weight:      Height:        General: Pt is alert, awake, not in acute distress Cardiovascular: RRR, S1/S2 +, no rubs, no gallops Respiratory: CTA bilaterally, no wheezing, no rhonchi Abdominal: Soft, NT, ND, bowel sounds + Extremities: no edema, no cyanosis    The results of significant diagnostics from this hospitalization (including imaging, microbiology, ancillary and laboratory) are listed below for reference.     Microbiology: Recent Results (from the past 240 hour(s))  SARS Coronavirus 2 by RT PCR (hospital order, performed in [facility] hospital lab) Nasopharyngeal Nasopharyngeal Swab     Status: None   Collection Time: 02/19/20 11:54 PM   Specimen: Nasopharyngeal Swab  Result Value Ref Range Status   SARS Coronavirus 2 NEGATIVE NEGATIVE Final    Comment: (NOTE) SARS-CoV-2 target nucleic acids are NOT DETECTED.  The SARS-CoV-2 RNA is generally detectable in upper and lower respiratory specimens during the acute phase of infection. The lowest concentration of SARS-CoV-2 viral copies this assay can detect is 250 copies / mL. A negative result does not preclude SARS-CoV-2 infection and should not be used as the sole basis for treatment or other patient management decisions.  A negative result may occur with improper specimen collection / handling,  submission of specimen other than nasopharyngeal swab, presence of viral mutation(s) within the areas targeted by this assay, and inadequate number of viral copies (<250 copies / mL). A negative result must be combined with clinical observations, patient history, and epidemiological information.  Fact Sheet for Patients:   StrictlyIdeas.no  Fact Sheet for Healthcare Providers: BankingDealers.co.za  This test is not yet approved or  cleared by the [place] FDA and has been authorized for detection and/or diagnosis of SARS-CoV-2 by FDA under an Emergency Use Authorization (EUA).  This EUA will remain in effect (meaning this test can be used) for the duration of the COVID-19 declaration  under Section 564(b)(1) of the Act, 21 U.S.C. section 360bbb-3(b)(1), unless the authorization is terminated or revoked sooner.  Performed at [facility], [address] [place], [place] [address]      Labs: BNP (last 3 results) No results for input(s): BNP in the last 8760 hours. Basic Metabolic Panel: Recent Labs  Lab 02/19/20 1920 02/20/20 0754 02/21/20 0321  NA 140 140 140  K 3.7 3.8 3.7  CL 108 111 110  CO2 20* 20* 20*  GLUCOSE 167* 163* 170*  BUN _0 CREATININE 1.99* 1.68* 1.73*  CALCIUM 9.5 9.0 9.5   Liver Function Tests: No results for input(s): AST, ALT, ALKPHOS, BILITOT, PROT, ALBUMIN in the last 168 hours. No results for input(s): LIPASE, AMYLASE in the last 168 hours. No results for input(s): AMMONIA in the last 168 hours. CBC: Recent Labs  Lab 02/19/20 1920  WBC 9.4  HGB 12.6  HCT 39.5  MCV 92.3  PLT 332   Cardiac Enzymes: No results for input(s): CKTOTAL, CKMB, CKMBINDEX, TROPONINI in the last 168 hours. BNP: Invalid input(s): POCBNP CBG: Recent Labs  Lab 02/20/20 1613 02/20/20 2104 02/21/20 0731 02/21/20 1212  GLUCAP 187* 199* 156* 209*   D-Dimer Recent Labs    02/20/20 0754   DDIMER 0.32   Hgb A1c No results for input(s): HGBA1C in the last 72 hours. Lipid Profile Recent Labs    02/20/20 0754  CHOL 196  HDL 39*  LDLCALC NOT CALCULATED  TRIG 199*  CHOLHDL 5.0   Thyroid function studies No results for input(s): TSH, T4TOTAL, T3FREE, THYROIDAB in the last 72 hours.  Invalid input(s): FREET3 Anemia work up No results for input(s): VITAMINB12, FOLATE, FERRITIN, TIBC, IRON, RETICCTPCT in the last 72 hours. Urinalysis No results found for: COLORURINE, APPEARANCEUR, [place], [place], [place], [place], [place], [place], PROTEINUR, UROBILINOGEN, NITRITE, LEUKOCYTESUR Sepsis Labs Invalid input(s): PROCALCITONIN,  WBC,  LACTICIDVEN Microbiology Recent Results (from the past 240 hour(s))  SARS Coronavirus 2 by RT PCR (hospital order, performed in [facility] hospital lab) Nasopharyngeal Nasopharyngeal Swab     Status: None   Collection Time: 02/19/20 11:54 PM   Specimen: Nasopharyngeal Swab  Result Value Ref Range Status   SARS Coronavirus 2 NEGATIVE NEGATIVE Final    Comment: (NOTE) SARS-CoV-2 target nucleic acids are NOT DETECTED.  The SARS-CoV-2 RNA is generally detectable in upper and lower respiratory specimens during the acute phase of infection. The lowest concentration of SARS-CoV-2 viral copies this assay can detect is 250 copies / mL. A negative result does not preclude SARS-CoV-2 infection and should not be used as the sole basis for treatment or other patient management decisions.  A negative result may occur with improper specimen collection / handling, submission of specimen other than nasopharyngeal swab, presence of viral mutation(s) within the areas targeted by this assay, and inadequate number of viral copies (<250 copies / mL). A negative result must be combined with clinical observations, patient history, and epidemiological information.  Fact Sheet for Patients:   StrictlyIdeas.no  Fact Sheet for  Healthcare Providers: BankingDealers.co.za  This test is not yet approved or  cleared by the [place] FDA and has been authorized for detection and/or diagnosis of SARS-CoV-2 by FDA under an Emergency Use Authorization (EUA).  This EUA will remain in effect (meaning this test can be used) for the duration of the COVID-19 declaration under Section 564(b)(1) of the Act, 21 U.S.C. section 360bbb-3(b)(1), unless the authorization is terminated or revoked sooner.  Performed  at [facility], [address] [place], [place] [address]      Time coordinating discharge: 36 minutes.   SIGNED:   Hosie Poisson, MD  [facility] 02/21/2020, 3:12 PM

## 2020-02-22 LAB — HEMOGLOBIN A1C
Hgb A1c MFr Bld: 7.3 % — ABNORMAL HIGH (ref 4.8–5.6)
Mean Plasma Glucose: 163 mg/dL

## 2020-02-25 ENCOUNTER — Telehealth: Payer: Self-pay | Admitting: Family Medicine

## 2020-02-25 NOTE — Telephone Encounter (Signed)
Patient was told my Paula Stout her med was approved for Lurasidone HCl (LATUDA) 120 MG TABS   but the pharm states the med is still being denied. plesae advise

## 2020-02-27 ENCOUNTER — Ambulatory Visit (INDEPENDENT_AMBULATORY_CARE_PROVIDER_SITE_OTHER): Payer: 59 | Admitting: Family Medicine

## 2020-02-27 ENCOUNTER — Other Ambulatory Visit: Payer: Self-pay

## 2020-02-27 ENCOUNTER — Other Ambulatory Visit: Payer: Self-pay | Admitting: Family Medicine

## 2020-02-27 ENCOUNTER — Encounter: Payer: Self-pay | Admitting: Family Medicine

## 2020-02-27 VITALS — BP 122/72 | HR 72 | Temp 96.3°F | Ht 66.0 in | Wt 244.5 lb

## 2020-02-27 DIAGNOSIS — N183 Chronic kidney disease, stage 3 unspecified: Secondary | ICD-10-CM

## 2020-02-27 DIAGNOSIS — I482 Chronic atrial fibrillation, unspecified: Secondary | ICD-10-CM | POA: Diagnosis not present

## 2020-02-27 DIAGNOSIS — E669 Obesity, unspecified: Secondary | ICD-10-CM | POA: Diagnosis not present

## 2020-02-27 DIAGNOSIS — E1169 Type 2 diabetes mellitus with other specified complication: Secondary | ICD-10-CM

## 2020-02-27 LAB — CBC
HCT: 36.9 % (ref 36.0–46.0)
Hemoglobin: 12.4 g/dL (ref 12.0–15.0)
MCHC: 33.7 g/dL (ref 30.0–36.0)
MCV: 90.8 fl (ref 78.0–100.0)
Platelets: 328 10*3/uL (ref 150.0–400.0)
RBC: 4.06 Mil/uL (ref 3.87–5.11)
RDW: 14.7 % (ref 11.5–15.5)
WBC: 7.1 10*3/uL (ref 4.0–10.5)

## 2020-02-27 LAB — BASIC METABOLIC PANEL
BUN: 17 mg/dL (ref 6–23)
CO2: 23 mEq/L (ref 19–32)
Calcium: 9.8 mg/dL (ref 8.4–10.5)
Chloride: 107 mEq/L (ref 96–112)
Creatinine, Ser: 1.81 mg/dL — ABNORMAL HIGH (ref 0.40–1.20)
GFR: 28.29 mL/min — ABNORMAL LOW (ref >60.00)
Glucose, Bld: 317 mg/dL — ABNORMAL HIGH (ref 70–99)
Potassium: 4.7 mEq/L (ref 3.5–5.1)
Sodium: 139 mEq/L (ref 135–145)

## 2020-02-27 MED ORDER — DAPAGLIFLOZIN PROPANEDIOL 10 MG PO TABS: 10.0000 mg | ORAL_TABLET | Freq: Every day | ORAL | 2 refills | Status: DC

## 2020-02-27 MED ORDER — METOPROLOL SUCCINATE ER 25 MG PO TB24: 50.0000 mg | ORAL_TABLET | Freq: Every day | ORAL | 2 refills | Status: DC

## 2020-02-27 NOTE — Addendum Note (Signed)
Addended by: Ames Coupe on: 02/27/2020 11:46 AM   Modules accepted: Level of Service

## 2020-02-27 NOTE — Telephone Encounter (Signed)
Will discuss with PCP in the office today for appt.

## 2020-02-27 NOTE — Telephone Encounter (Signed)
alogliptin (Nesina) 12.5 mg tablet was approved not Latuda. The patient is aware of Latuda denial of approval and Alogliptin approval.

## 2020-02-27 NOTE — Progress Notes (Addendum)
Chief Complaint  Patient presents with  . Hospitalization Follow-up    HPI Paula Stout is a 64 y.o. y.o. female who presents for a transition of care visit.  Pt was discharged from [facility] on 02/21/2020.    Admitted on 02/19/20 for CP and a fib. Cards increased fenofibrate and Lipitor, f/u w EP in July and gen cards on 6/25. She is compliant w Toprol XL 25 mg/d and Cardizem XT 120 mg/d. She is fine at rest. Starts having rapid heart beat, fatigue and sob when she physically exerts herself. No current CP or sob. Her metformin was D/C'd due to Cr elevation, though her GFR remained in CKD3 range. Her sugars have been elevated from that.  Of note, her [name] was not approved when I sent it in. She has around 10 days left. Her previous psychiatrist's office told her they cannot prescribe due to it being out of state.   Past Medical History:  Diagnosis Date  . A-fib ([place])   . Anxiety   . Asthma   . Bipolar disorder ([place])   . Depression   . Diabetes mellitus without complication ([place])   . Irritable bowel syndrome (IBS)   . Migraine    Past Surgical History:  Procedure Laterality Date  . ABDOMINAL HYSTERECTOMY    . ATRIAL FIBRILLATION ABLATION    . CARDIAC CATHETERIZATION    . CESAREAN SECTION    . KNEE SURGERY    . ROTATOR CUFF REPAIR     Family History  Adopted: Yes   Allergies as of 02/27/2020      Reactions   Bee Pollen Anaphylaxis, Shortness Of Breath   Other reaction(s): Generalized Edema Allergic to bee stings   Bee Venom Anaphylaxis   Onion    Other reaction(s): Respiratory Distress   Sumatriptan Other (See Comments)   Other reaction(s): UNKNOWN Passed out, nose bleed   Asa [aspirin] Nausea And Vomiting   Beeswax    Nsaids Nausea And Vomiting   Iodine Rash   Sulfa Antibiotics Rash   Vancomycin Rash      Medication List       Accurate as of February 27, 2020 11:43 AM. If you have any questions, ask your nurse or doctor.        STOP taking these medications     Latuda 120 MG Tabs Generic drug: Lurasidone HCl Stopped by: [name], DO     TAKE these medications   albuterol 108 (90 Base) MCG/ACT inhaler Commonly known as: VENTOLIN HFA Inhale 2 puffs into the lungs every 4 (four) hours.   Alogliptin Benzoate 12.5 MG Tabs Take 1 tablet by mouth daily.   apixaban 5 MG Tabs tablet Commonly known as: ELIQUIS Take 1 tablet (5 mg total) by mouth in the morning and at bedtime.   atorvastatin 80 MG tablet Commonly known as: LIPITOR Take 1 tablet (80 mg total) by mouth daily at 6 PM.   buPROPion 300 MG 24 hr tablet Commonly known as: WELLBUTRIN XL Take 1 tablet (300 mg total) by mouth daily.   butalbital-acetaminophen-caffeine 50-325-40 MG tablet Commonly known as: FIORICET Take 1 tablet by mouth every 6 (six) hours.   diltiazem 120 MG 24 hr capsule Commonly known as: Cartia XT Take 1 capsule (120 mg total) by mouth daily.   fenofibrate 160 MG tablet Take 1 tablet (160 mg total) by mouth daily.   freestyle lancets 1 each by Other route daily.   FREESTYLE TEST STRIPS test strip Generic drug: glucose blood  1 each by Other route daily.   glucose monitoring kit monitoring kit Use daily to check blood sugar.  DX E11.9   metoprolol succinate 25 MG 24 hr tablet Commonly known as: TOPROL-XL Take 2 tablets (50 mg total) by mouth daily. What changed: how much to take Changed by: [name], DO   nitroGLYCERIN 0.4 MG SL tablet Commonly known as: NITROSTAT Place 1 tablet (0.4 mg total) under the tongue as needed.   ondansetron 4 MG disintegrating tablet Commonly known as: ZOFRAN-ODT Take 1 tablet (4 mg total) by mouth every 8 (eight) hours as needed for nausea.   pantoprazole 40 MG tablet Commonly known as: PROTONIX Take 1 tablet (40 mg total) by mouth daily.   QUEtiapine 300 MG tablet Commonly known as: SEROquel Take 1 tablet (300 mg total) by mouth at bedtime.   rizatriptan 10 MG disintegrating  tablet Commonly known as: MAXALT-MLT Place 1 tablet under the tongue daily as needed.   topiramate 50 MG tablet Commonly known as: TOPAMAX Take 3 tablets (150 mg total) by mouth in the morning and at bedtime.       Objective BP 122/72 (BP Location: Left Arm, Patient Position: Sitting, Cuff Size: Normal)   Pulse 72   Temp (!) 96.3 F (35.7 C) (Temporal)   Ht _0  (1.676 m)   Wt 244 lb 8 oz (110.9 kg)   SpO2 98%   BMI 39.46 kg/m  General Appearance:  awake, alert, oriented, in no acute distress and well developed, well nourished Skin:  there are no suspicious lesions or rashes of concern Neck:  neck- supple, no mass, non-tender and no jvd Lungs: Clear to auscultation.  No rales, rhonchi, or wheezing. Normal effort, no accessory muscle use. Heart:  Heart sounds are normal.  Regular rate and rhythm without murmur, gallop or rub. No bruits. Musculoskeletal:  No muscle group atrophy or asymmetry, gait normal Neurologic:  Alert and oriented x 3, gait normal., reflexes normal and symmetric, strength and  sensation grossly normal Psych exam: Nml mood and affect, age appropriate judgment and insight  Chronic atrial fibrillation (HCC) - Plan: metoprolol succinate (TOPROL-XL) 25 MG 24 hr tablet, CBC  Diabetes mellitus type 2 in obese (HCC)  Stage 3 chronic kidney disease, unspecified whether stage 3a or 3b CKD - Plan: CBC, Basic metabolic panel  Discharge summary and medication list have been reviewed/reconciled.  Labs pending at the time of discharge have been reviewed or are still pending at the time of this visit.  Follow-up labs and appointments have been ordered and/or coordinated appropriately.  Will likely restart 500 mg bid metformin. Will await results though.  Will have pt reach out to psych office to see if provider willing to rx across state lines which is legal unless there is a state law that I am not aware of for [place]. I could try Geodon or Abilify if she wants to trial that  prior to getting in with psych here.  F/u as originally scheduled. The patient voiced understanding and agreement to the plan.  [place], DO 02/27/20 11:43 AM

## 2020-02-27 NOTE — Patient Instructions (Signed)
Please call your former psychiatrist and see if she is willing to send in more Latuda to your pharmacy in [place]. She is legally allowed to prescribe medicine across state lines.   Keep the diet clean and stay active.  Give [place] 2-3 business days to get the results of your labs back.  Based on the lab results, we will determine what do with your diabetes regimen.  Let us know if you need anything.

## 2020-02-27 NOTE — Progress Notes (Signed)
bmp 

## 2020-02-29 ENCOUNTER — Ambulatory Visit (INDEPENDENT_AMBULATORY_CARE_PROVIDER_SITE_OTHER): Payer: 59 | Admitting: Cardiology

## 2020-02-29 ENCOUNTER — Encounter: Payer: Self-pay | Admitting: Cardiology

## 2020-02-29 ENCOUNTER — Other Ambulatory Visit: Payer: Self-pay

## 2020-02-29 VITALS — BP 92/68 | HR 72 | Ht 66.0 in | Wt 244.0 lb

## 2020-02-29 DIAGNOSIS — E1169 Type 2 diabetes mellitus with other specified complication: Secondary | ICD-10-CM | POA: Diagnosis not present

## 2020-02-29 DIAGNOSIS — I480 Paroxysmal atrial fibrillation: Secondary | ICD-10-CM | POA: Diagnosis not present

## 2020-02-29 DIAGNOSIS — N183 Chronic kidney disease, stage 3 unspecified: Secondary | ICD-10-CM

## 2020-02-29 DIAGNOSIS — E785 Hyperlipidemia, unspecified: Secondary | ICD-10-CM

## 2020-02-29 DIAGNOSIS — E669 Obesity, unspecified: Secondary | ICD-10-CM

## 2020-02-29 DIAGNOSIS — Z7901 Long term (current) use of anticoagulants: Secondary | ICD-10-CM | POA: Diagnosis not present

## 2020-02-29 NOTE — Progress Notes (Signed)
Cardiology Office Note:    Date:  02/29/2020   ID:  Paula Stout, DOB December 16, 1955, MRN [id number]  PCP:  [name], DO  Cardiologist:  Jenean Lindau, MD   Referring MD: [name]*    ASSESSMENT:    1. AF (paroxysmal atrial fibrillation) ([place])   2. Chronic anticoagulation   3. Diabetes mellitus type 2 in obese ([place])   4. Dyslipidemia   5. Stage 3 chronic kidney disease, unspecified whether stage 3a or 3b CKD    PLAN:    In order of problems listed above:  1. EKG done today reveals sinus rhythm and nonspecific ST-T changes. 2. Paroxysmal atrial fibrillation: I discussed with the patient atrial fibrillation, disease process. Management and therapy including rate and rhythm control, anticoagulation benefits and potential risks were discussed extensively with the patient. Patient had multiple questions which were answered to patient's satisfaction.  Patient is experiencing significant episodes of palpitations.  I think she would benefit from medication such as flecainide however she has multiple risk factors for coronary artery disease and a questionable stress test.  In view of this I gave her options of evaluation with coronary angiography and she is agreeable.  Benefits and potential risks explained to her at extensive length and she vocalized understanding.  This is especially because she has significant renal insufficiency.  I think in view of the information we have on her I do not see any reason to do left ventriculography.  I would leave this to the discretion of her interventional cardiologist.  She needs to be well hydrated and all renal precautions will be in place.  She agrees and completely understands and questions were answered to her satisfaction.  Obviously if she needs intervention then she is not a candidate for such antirhythmic therapy and other therapies will have to be tried and we will at that point to refer her to our electrophysiology  colleagues.  But if she has nonobstructive disease she might benefit more from medication such as flecainide.  This was discussed with her at length and she is agreeable and would like to do it. 3. Essential hypertension: Blood pressure is stable 4. Mixed dyslipidemia diabetes mellitus and obesity: I had an extensive discussion with her about diet and exercise.  She appears depressed after the loss of her husband about a year ago.  She is going to do better.  She has no suicidal ideations.  She wants to start getting her life back and exercise once her coronary angiography is done.  Weight loss was stressed risks of obesity explained she vocalized understanding.Patient will be seen in follow-up appointment in 6 weeks or earlier if the patient has any concerns    Medication Adjustments/Labs and Tests Ordered: Current medicines are reviewed at length with the patient today.  Concerns regarding medicines are outlined above.  No orders of the defined types were placed in this encounter.  No orders of the defined types were placed in this encounter.    History of Present Illness:    Paula Stout is a 64 y.o. female who is being seen today for the evaluation of paroxysmal atrial fibrillation at the request of [name]*.  Patient is a pleasant 64 year old female.  She has past medical history of essential hypertension dyslipidemia diabetes mellitus and obesity.  She has history of paroxysmal atrial fibrillation.  She is more here from [place].  She mentions to me that she had ablation once and it was supposed to go  back and do a second ablation for this.  But because of Covid she got postponed and she has moved here.  She complains of palpitations on and off.  Recently she went to the emergency room for the same reason and was evaluated echocardiogram was unremarkable and stress test showed the possibility of ischemia.  She is here for follow-up for the same.  At the time of my  evaluation, the patient is alert awake oriented and in no distress.  Past Medical History:  Diagnosis Date  . A-fib ([place])   . Anxiety   . Asthma   . Bipolar disorder ([place])   . Depression   . Diabetes mellitus without complication ([place])   . Irritable bowel syndrome (IBS)   . Migraine     Past Surgical History:  Procedure Laterality Date  . ABDOMINAL HYSTERECTOMY    . ATRIAL FIBRILLATION ABLATION    . CARDIAC CATHETERIZATION    . CESAREAN SECTION    . KNEE SURGERY    . ROTATOR CUFF REPAIR      Current Medications: Current Meds  Medication Sig  . albuterol (VENTOLIN HFA) 108 (90 Base) MCG/ACT inhaler Inhale 2 puffs into the lungs every 4 (four) hours.  . Alogliptin Benzoate 12.5 MG TABS Take 1 tablet by mouth daily.  [name] apixaban (ELIQUIS) 5 MG TABS tablet Take 1 tablet (5 mg total) by mouth in the morning and at bedtime.  [name] atorvastatin (LIPITOR) 80 MG tablet Take 1 tablet (80 mg total) by mouth daily at 6 PM.  . buPROPion (WELLBUTRIN XL) 300 MG 24 hr tablet Take 1 tablet (300 mg total) by mouth daily.  . butalbital-acetaminophen-caffeine (FIORICET) 50-325-40 MG tablet Take 1 tablet by mouth every 6 (six) hours.  [name] diltiazem (CARTIA XT) 120 MG 24 hr capsule Take 1 capsule (120 mg total) by mouth daily.  . fenofibrate 160 MG tablet Take 1 tablet (160 mg total) by mouth daily.  [name] glucose blood (FREESTYLE TEST STRIPS) test strip 1 each by Other route daily.  [name] glucose monitoring kit (FREESTYLE) monitoring kit Use daily to check blood sugar.  DX E11.9  . Lancets (FREESTYLE) lancets 1 each by Other route daily.  . metoprolol succinate (TOPROL-XL) 25 MG 24 hr tablet Take 2 tablets (50 mg total) by mouth daily.  . nitroGLYCERIN (NITROSTAT) 0.4 MG SL tablet Place 1 tablet (0.4 mg total) under the tongue as needed.  . ondansetron (ZOFRAN-ODT) 4 MG disintegrating tablet Take 1 tablet (4 mg total) by mouth every 8 (eight) hours as needed for nausea.  . pantoprazole (PROTONIX) 40 MG tablet  Take 1 tablet (40 mg total) by mouth daily.  . QUEtiapine (SEROQUEL) 300 MG tablet Take 1 tablet (300 mg total) by mouth at bedtime.  . rizatriptan (MAXALT-MLT) 10 MG disintegrating tablet Place 1 tablet under the tongue daily as needed.  . topiramate (TOPAMAX) 50 MG tablet Take 3 tablets (150 mg total) by mouth in the morning and at bedtime.     Allergies:   Bee pollen, Bee venom, Onion, Sumatriptan, Asa [aspirin], Beeswax, Nsaids, Iodine, Sulfa antibiotics, and Vancomycin   Social History   Socioeconomic History  . Marital status: Widowed    Spouse name: Not on file  . Number of children: Not on file  . Years of education: Not on file  . Highest education level: Not on file  Occupational History  . Not on file  Tobacco Use  . Smoking status: Never Smoker  . Smokeless tobacco: Never Used  Vaping Use  . Vaping Use: Never used  Substance and Sexual Activity  . Alcohol use: Never  . Drug use: Never  . Sexual activity: Not Currently  Other Topics Concern  . Not on file  Social History Narrative  . Not on file   Social Determinants of Health   Financial Resource Strain:   . Difficulty of Paying Living Expenses:   Food Insecurity:   . Worried About Charity fundraiser in the Last Year:   . Arboriculturist in the Last Year:   Transportation Needs:   . Film/video editor (Medical):   [name] Lack of Transportation (Non-Medical):   Physical Activity:   . Days of Exercise per Week:   . Minutes of Exercise per Session:   Stress:   . Feeling of Stress :   Social Connections:   . Frequency of Communication with Friends and Family:   . Frequency of Social Gatherings with Friends and Family:   . Attends Religious Services:   . Active Member of Clubs or Organizations:   . Attends Archivist Meetings:   [name] Marital Status:      Family History: The patient's family history is not on file. She was adopted.  ROS:   Please see the history of present illness.    All  other systems reviewed and are negative.  EKGs/Labs/Other Studies Reviewed:    The following studies were reviewed today: IMPRESSION: 1. Small focus ischemia versus variable attenuation in the anteroseptal wall.  2. Normal left ventricular wall motion.  3. Left ventricular ejection fraction 66%  4. Non invasive risk stratification*: Low  *2012 Appropriate Use Criteria for Coronary Revascularization Focused Update: J Am Coll Cardiol. 1610;96(0):454-098. http://content.airportbarriers.com.aspx?articleid=[email or web address]   Electronically Signed   By: Suzy Bouchard M.D.   On: 02/21/2020 12:27   IMPRESSIONS    1. Left ventricular ejection fraction, by estimation, is 60 to 65%. The  left ventricle has normal function. The left ventricle has no regional  wall motion abnormalities. Left ventricular diastolic parameters are  consistent with Grade I diastolic  dysfunction (impaired relaxation). Elevated left ventricular end-diastolic  pressure.  2. Right ventricular systolic function is normal. The right ventricular  size is normal. There is normal pulmonary artery systolic pressure. The  estimated right ventricular systolic pressure is 11.9 mmHg.  3. The mitral valve is normal in structure. No evidence of mitral valve  regurgitation. No evidence of mitral stenosis.  4. The aortic valve is normal in structure. Aortic valve regurgitation is  not visualized. No aortic stenosis is present.  5. Aortic dilatation noted. 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  6. The inferior vena cava is normal in size with greater than 50%  respiratory variability, suggesting right atrial pressure of 3 mmHg.    Recent Labs: 02/27/2020: BUN 17; Creatinine, Ser 1.81; Hemoglobin 12.4; Platelets 328.0; Potassium 4.7; Sodium 139  Recent Lipid Panel    Component Value Date/Time   CHOL 196 02/20/2020 0754   TRIG 199 (H) 02/20/2020 0754   HDL 39 (L) 02/20/2020 0754     CHOLHDL 5.0 02/20/2020 0754   VLDL 40 02/20/2020 0754   LDLCALC NOT CALCULATED 02/20/2020 0754    Physical Exam:    VS:  BP 92/68   Pulse 72   Ht _0  (1.676 m)   Wt 244 lb (110.7 kg)   SpO2 94%   BMI 39.38 kg/m     Wt Readings from Last  3 Encounters:  02/29/20 244 lb (110.7 kg)  02/27/20 244 lb 8 oz (110.9 kg)  02/21/20 239 lb 11.2 oz (108.7 kg)     GEN: Patient is in no acute distress HEENT: Normal NECK: No JVD; No carotid bruits LYMPHATICS: No lymphadenopathy CARDIAC: S1 S2 regular, 2/6 systolic murmur at the apex. RESPIRATORY:  Clear to auscultation without rales, wheezing or rhonchi  ABDOMEN: Soft, non-tender, non-distended MUSCULOSKELETAL:  No edema; No deformity  SKIN: Warm and dry NEUROLOGIC:  Alert and oriented x 3 PSYCHIATRIC:  Normal affect    Signed, Jenean Lindau, MD  02/29/2020 10:56 AM    [place]

## 2020-02-29 NOTE — H&P (View-Only) (Signed)
Cardiology Office Note:    Date:  02/29/2020   ID:  Paula Stout, DOB December 16, 1955, MRN [id number]  PCP:  [name], DO  Cardiologist:  Jenean Lindau, MD   Referring MD: [name]*    ASSESSMENT:    1. AF (paroxysmal atrial fibrillation) ([place])   2. Chronic anticoagulation   3. Diabetes mellitus type 2 in obese ([place])   4. Dyslipidemia   5. Stage 3 chronic kidney disease, unspecified whether stage 3a or 3b CKD    PLAN:    In order of problems listed above:  1. EKG done today reveals sinus rhythm and nonspecific ST-T changes. 2. Paroxysmal atrial fibrillation: I discussed with the patient atrial fibrillation, disease process. Management and therapy including rate and rhythm control, anticoagulation benefits and potential risks were discussed extensively with the patient. Patient had multiple questions which were answered to patient's satisfaction.  Patient is experiencing significant episodes of palpitations.  I think she would benefit from medication such as flecainide however she has multiple risk factors for coronary artery disease and a questionable stress test.  In view of this I gave her options of evaluation with coronary angiography and she is agreeable.  Benefits and potential risks explained to her at extensive length and she vocalized understanding.  This is especially because she has significant renal insufficiency.  I think in view of the information we have on her I do not see any reason to do left ventriculography.  I would leave this to the discretion of her interventional cardiologist.  She needs to be well hydrated and all renal precautions will be in place.  She agrees and completely understands and questions were answered to her satisfaction.  Obviously if she needs intervention then she is not a candidate for such antirhythmic therapy and other therapies will have to be tried and we will at that point to refer her to our electrophysiology  colleagues.  But if she has nonobstructive disease she might benefit more from medication such as flecainide.  This was discussed with her at length and she is agreeable and would like to do it. 3. Essential hypertension: Blood pressure is stable 4. Mixed dyslipidemia diabetes mellitus and obesity: I had an extensive discussion with her about diet and exercise.  She appears depressed after the loss of her husband about a year ago.  She is going to do better.  She has no suicidal ideations.  She wants to start getting her life back and exercise once her coronary angiography is done.  Weight loss was stressed risks of obesity explained she vocalized understanding.Patient will be seen in follow-up appointment in 6 weeks or earlier if the patient has any concerns    Medication Adjustments/Labs and Tests Ordered: Current medicines are reviewed at length with the patient today.  Concerns regarding medicines are outlined above.  No orders of the defined types were placed in this encounter.  No orders of the defined types were placed in this encounter.    History of Present Illness:    Paula Stout is a 64 y.o. female who is being seen today for the evaluation of paroxysmal atrial fibrillation at the request of [name]*.  Patient is a pleasant 64 year old female.  She has past medical history of essential hypertension dyslipidemia diabetes mellitus and obesity.  She has history of paroxysmal atrial fibrillation.  She is more here from [place].  She mentions to me that she had ablation once and it was supposed to go  back and do a second ablation for this.  But because of Covid she got postponed and she has moved here.  She complains of palpitations on and off.  Recently she went to the emergency room for the same reason and was evaluated echocardiogram was unremarkable and stress test showed the possibility of ischemia.  She is here for follow-up for the same.  At the time of my  evaluation, the patient is alert awake oriented and in no distress.  Past Medical History:  Diagnosis Date  . A-fib ([place])   . Anxiety   . Asthma   . Bipolar disorder ([place])   . Depression   . Diabetes mellitus without complication ([place])   . Irritable bowel syndrome (IBS)   . Migraine     Past Surgical History:  Procedure Laterality Date  . ABDOMINAL HYSTERECTOMY    . ATRIAL FIBRILLATION ABLATION    . CARDIAC CATHETERIZATION    . CESAREAN SECTION    . KNEE SURGERY    . ROTATOR CUFF REPAIR      Current Medications: Current Meds  Medication Sig  . albuterol (VENTOLIN HFA) 108 (90 Base) MCG/ACT inhaler Inhale 2 puffs into the lungs every 4 (four) hours.  . Alogliptin Benzoate 12.5 MG TABS Take 1 tablet by mouth daily.  [name] apixaban (ELIQUIS) 5 MG TABS tablet Take 1 tablet (5 mg total) by mouth in the morning and at bedtime.  [name] atorvastatin (LIPITOR) 80 MG tablet Take 1 tablet (80 mg total) by mouth daily at 6 PM.  . buPROPion (WELLBUTRIN XL) 300 MG 24 hr tablet Take 1 tablet (300 mg total) by mouth daily.  . butalbital-acetaminophen-caffeine (FIORICET) 50-325-40 MG tablet Take 1 tablet by mouth every 6 (six) hours.  [name] diltiazem (CARTIA XT) 120 MG 24 hr capsule Take 1 capsule (120 mg total) by mouth daily.  . fenofibrate 160 MG tablet Take 1 tablet (160 mg total) by mouth daily.  [name] glucose blood (FREESTYLE TEST STRIPS) test strip 1 each by Other route daily.  [name] glucose monitoring kit (FREESTYLE) monitoring kit Use daily to check blood sugar.  DX E11.9  . Lancets (FREESTYLE) lancets 1 each by Other route daily.  . metoprolol succinate (TOPROL-XL) 25 MG 24 hr tablet Take 2 tablets (50 mg total) by mouth daily.  . nitroGLYCERIN (NITROSTAT) 0.4 MG SL tablet Place 1 tablet (0.4 mg total) under the tongue as needed.  . ondansetron (ZOFRAN-ODT) 4 MG disintegrating tablet Take 1 tablet (4 mg total) by mouth every 8 (eight) hours as needed for nausea.  . pantoprazole (PROTONIX) 40 MG tablet  Take 1 tablet (40 mg total) by mouth daily.  . QUEtiapine (SEROQUEL) 300 MG tablet Take 1 tablet (300 mg total) by mouth at bedtime.  . rizatriptan (MAXALT-MLT) 10 MG disintegrating tablet Place 1 tablet under the tongue daily as needed.  . topiramate (TOPAMAX) 50 MG tablet Take 3 tablets (150 mg total) by mouth in the morning and at bedtime.     Allergies:   Bee pollen, Bee venom, Onion, Sumatriptan, Asa [aspirin], Beeswax, Nsaids, Iodine, Sulfa antibiotics, and Vancomycin   Social History   Socioeconomic History  . Marital status: Widowed    Spouse name: Not on file  . Number of children: Not on file  . Years of education: Not on file  . Highest education level: Not on file  Occupational History  . Not on file  Tobacco Use  . Smoking status: Never Smoker  . Smokeless tobacco: Never Used  Vaping Use  . Vaping Use: Never used  Substance and Sexual Activity  . Alcohol use: Never  . Drug use: Never  . Sexual activity: Not Currently  Other Topics Concern  . Not on file  Social History Narrative  . Not on file   Social Determinants of Health   Financial Resource Strain:   . Difficulty of Paying Living Expenses:   Food Insecurity:   . Worried About Charity fundraiser in the Last Year:   . Arboriculturist in the Last Year:   Transportation Needs:   . Film/video editor (Medical):   [name] Lack of Transportation (Non-Medical):   Physical Activity:   . Days of Exercise per Week:   . Minutes of Exercise per Session:   Stress:   . Feeling of Stress :   Social Connections:   . Frequency of Communication with Friends and Family:   . Frequency of Social Gatherings with Friends and Family:   . Attends Religious Services:   . Active Member of Clubs or Organizations:   . Attends Archivist Meetings:   [name] Marital Status:      Family History: The patient's family history is not on file. She was adopted.  ROS:   Please see the history of present illness.    All  other systems reviewed and are negative.  EKGs/Labs/Other Studies Reviewed:    The following studies were reviewed today: IMPRESSION: 1. Small focus ischemia versus variable attenuation in the anteroseptal wall.  2. Normal left ventricular wall motion.  3. Left ventricular ejection fraction 66%  4. Non invasive risk stratification*: Low  *2012 Appropriate Use Criteria for Coronary Revascularization Focused Update: J Am Coll Cardiol. 1610;96(0):454-098. http://content.airportbarriers.com.aspx?articleid=1201161   Electronically Signed   By: Suzy Bouchard M.D.   On: 02/21/2020 12:27   IMPRESSIONS    1. Left ventricular ejection fraction, by estimation, is 60 to 65%. The  left ventricle has normal function. The left ventricle has no regional  wall motion abnormalities. Left ventricular diastolic parameters are  consistent with Grade I diastolic  dysfunction (impaired relaxation). Elevated left ventricular end-diastolic  pressure.  2. Right ventricular systolic function is normal. The right ventricular  size is normal. There is normal pulmonary artery systolic pressure. The  estimated right ventricular systolic pressure is 11.9 mmHg.  3. The mitral valve is normal in structure. No evidence of mitral valve  regurgitation. No evidence of mitral stenosis.  4. The aortic valve is normal in structure. Aortic valve regurgitation is  not visualized. No aortic stenosis is present.  5. Aortic dilatation noted. 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  6. The inferior vena cava is normal in size with greater than 50%  respiratory variability, suggesting right atrial pressure of 3 mmHg.    Recent Labs: 02/27/2020: BUN 17; Creatinine, Ser 1.81; Hemoglobin 12.4; Platelets 328.0; Potassium 4.7; Sodium 139  Recent Lipid Panel    Component Value Date/Time   CHOL 196 02/20/2020 0754   TRIG 199 (H) 02/20/2020 0754   HDL 39 (L) 02/20/2020 0754     CHOLHDL 5.0 02/20/2020 0754   VLDL 40 02/20/2020 0754   LDLCALC NOT CALCULATED 02/20/2020 0754    Physical Exam:    VS:  BP 92/68   Pulse 72   Ht _0  (1.676 m)   Wt 244 lb (110.7 kg)   SpO2 94%   BMI 39.38 kg/m     Wt Readings from Last  3 Encounters:  02/29/20 244 lb (110.7 kg)  02/27/20 244 lb 8 oz (110.9 kg)  02/21/20 239 lb 11.2 oz (108.7 kg)     GEN: Patient is in no acute distress HEENT: Normal NECK: No JVD; No carotid bruits LYMPHATICS: No lymphadenopathy CARDIAC: S1 S2 regular, 2/6 systolic murmur at the apex. RESPIRATORY:  Clear to auscultation without rales, wheezing or rhonchi  ABDOMEN: Soft, non-tender, non-distended MUSCULOSKELETAL:  No edema; No deformity  SKIN: Warm and dry NEUROLOGIC:  Alert and oriented x 3 PSYCHIATRIC:  Normal affect    Signed, Jenean Lindau, MD  02/29/2020 10:56 AM    [place]

## 2020-02-29 NOTE — Patient Instructions (Signed)
Medication Instructions:  No medication changes. *If you need a refill on your cardiac medications before your next appointment, please call your pharmacy*   Lab Work: Your physician recommends that you have your labs today for your cath. You had a BMET and a CBC.  If you have labs (blood work) drawn today and your tests are completely normal, you will receive your results only by: [name] MyChart Message (if you have MyChart) OR . A paper copy in the mail If you have any lab test that is abnormal or we need to change your treatment, we will call you to review the results.   Testing/Procedures:    Paula Stout, Paula Stout Dept: [id number] Loc: [phone]  Paula Stout  02/29/2020  You are scheduled for a Cardiac Catheterization on Tuesday, June 29 with Dr. Daneen Schick.  1. Please arrive at the [facility] (Main Entrance A) at [facility]: [address] [place], [place] [address] at 10:00 AM (This time is two hours before your procedure to ensure your preparation). Free valet parking service is available.   Special note: Every effort is made to have your procedure done on time. Please understand that emergencies sometimes delay scheduled procedures.  2. Diet: Do not eat solid foods after midnight.  The patient may have clear liquids until 5am upon the day of the procedure.  3. Labs: You had your labs done today in the office.  4. Medication instructions in preparation for your procedure:   Contrast Allergy: No   Stop taking Eliquis (Apixiban) on Sunday, June 27.   On the morning of your procedure, take your Aspirin and any morning medicines NOT listed above.  You may use sips of water.  5. Plan for one night stay--bring personal belongings. 6. Bring a current list of your medications and current insurance cards. 7. You MUST have a responsible  person to drive you home. 8. Someone MUST be with you the first 24 hours after you arrive home or your discharge will be delayed. 9. Please wear clothes that are easy to get on and off and wear slip-on shoes.  Thank you for allowing [place] to care for you!   -- [place] [facility]    Follow-Up: At [facility], you and your health needs are our priority.  As part of our continuing mission to provide you with exceptional heart care, we have created designated Provider Care Teams.  These Care Teams include your primary Cardiologist (physician) and Advanced Practice Providers (APPs -  Physician Assistants and Nurse Practitioners) who all work together to provide you with the care you need, when you need it.  We recommend signing up for the patient portal called "MyChart".  Sign up information is provided on this After Visit Summary.  MyChart is used to connect with patients for Virtual Visits (Telemedicine).  Patients are able to view lab/test results, encounter notes, upcoming appointments, etc.  Non-urgent messages can be sent to your provider as well.   To learn more about what you can do with MyChart, go to [email or web address].    Your next appointment:   6 week(s)  The format for your next appointment:   In Person  Provider:   Jyl Heinz, MD   Other Instructions NA

## 2020-03-01 LAB — CBC WITH DIFFERENTIAL/PLATELET
Basophils Absolute: 0.1 10*3/uL (ref 0.0–0.2)
Basos: 1 %
EOS (ABSOLUTE): 0.3 10*3/uL (ref 0.0–0.4)
Eos: 3 %
Hematocrit: 39.1 % (ref 34.0–46.6)
Hemoglobin: 12.8 g/dL (ref 11.1–15.9)
Immature Grans (Abs): 0 10*3/uL (ref 0.0–0.1)
Immature Granulocytes: 0 %
Lymphocytes Absolute: 1.9 10*3/uL (ref 0.7–3.1)
Lymphs: 23 %
MCH: 29.7 pg (ref 26.6–33.0)
MCHC: 32.7 g/dL (ref 31.5–35.7)
MCV: 91 fL (ref 79–97)
Monocytes Absolute: 0.6 10*3/uL (ref 0.1–0.9)
Monocytes: 7 %
Neutrophils Absolute: 5.4 10*3/uL (ref 1.4–7.0)
Neutrophils: 66 %
Platelets: 341 10*3/uL (ref 150–450)
RBC: 4.31 x10E6/uL (ref 3.77–5.28)
RDW: 13.7 % (ref 11.7–15.4)
WBC: 8.2 10*3/uL (ref 3.4–10.8)

## 2020-03-01 LAB — BASIC METABOLIC PANEL
BUN/Creatinine Ratio: 11 — ABNORMAL LOW (ref 12–28)
BUN: 18 mg/dL (ref 8–27)
CO2: 21 mmol/L (ref 20–29)
Calcium: 10 mg/dL (ref 8.7–10.3)
Chloride: 106 mmol/L (ref 96–106)
Creatinine, Ser: 1.58 mg/dL — ABNORMAL HIGH (ref 0.57–1.00)
GFR calc Af Amer: 40 mL/min/{1.73_m2} — ABNORMAL LOW (ref >59)
GFR calc non Af Amer: 35 mL/min/{1.73_m2} — ABNORMAL LOW (ref >59)
Glucose: 198 mg/dL — ABNORMAL HIGH (ref 65–99)
Potassium: 4.7 mmol/L (ref 3.5–5.2)
Sodium: 142 mmol/L (ref 134–144)

## 2020-03-03 ENCOUNTER — Telehealth: Payer: Self-pay

## 2020-03-03 NOTE — Telephone Encounter (Signed)
Attempted to call patient to review pre-cath instructions with her. Patient did not answer the phone. Left message for her to call back so we could review the pre-cath instructions with her.  Verified arrival time and place: [facility]) at: 10:00am  No solid food after midnight prior to cath, clear liquids until 5 AM day of procedure.  AM meds can be  taken pre-cath with sips of water including: ASA 81 mg  Hold Eliquis 2 days before the procedure   Hold Alogliptin benzoate and farxiga the morning of the procedure  Confirmed patient has responsible adult to drive home post procedure and observe 24 hours after arriving home:  You are allowed ONE visitor in the waiting room during your procedure. Both you and your visitor must wear masks.      COVID-19 Pre-Screening Questions:  . In the past 7 to 10 days have you had a new cough, shortness of breath, headache, congestion, fever (100 or greater) unexplained body aches, new sore throat, or sudden loss of taste or sense of smell?  [name] In the past 7 to 10 days have you been around anyone with known Covid 19?

## 2020-03-04 ENCOUNTER — Other Ambulatory Visit: Payer: Self-pay

## 2020-03-04 ENCOUNTER — Ambulatory Visit (HOSPITAL_COMMUNITY)
Admission: RE | Disposition: A | Discharge status: Home / Self Care | Payer: Self-pay | Source: Home / Self Care | Attending: Interventional Cardiology

## 2020-03-04 ENCOUNTER — Other Ambulatory Visit: Payer: Self-pay | Admitting: Family Medicine

## 2020-03-04 ENCOUNTER — Ambulatory Visit (HOSPITAL_COMMUNITY)
Admission: RE | Admit: 2020-03-04 | Discharge: 2020-03-04 | Disposition: A | Discharge status: Home / Self Care | Payer: 59 | Attending: Interventional Cardiology | Admitting: Interventional Cardiology

## 2020-03-04 DIAGNOSIS — Z6839 Body mass index (BMI) 39.0-39.9, adult: Secondary | ICD-10-CM | POA: Diagnosis not present

## 2020-03-04 DIAGNOSIS — Z881 Allergy status to other antibiotic agents status: Secondary | ICD-10-CM | POA: Diagnosis not present

## 2020-03-04 DIAGNOSIS — R0789 Other chest pain: Secondary | ICD-10-CM

## 2020-03-04 DIAGNOSIS — E669 Obesity, unspecified: Secondary | ICD-10-CM | POA: Insufficient documentation

## 2020-03-04 DIAGNOSIS — J45909 Unspecified asthma, uncomplicated: Secondary | ICD-10-CM | POA: Diagnosis not present

## 2020-03-04 DIAGNOSIS — F319 Bipolar disorder, unspecified: Secondary | ICD-10-CM | POA: Insufficient documentation

## 2020-03-04 DIAGNOSIS — I480 Paroxysmal atrial fibrillation: Secondary | ICD-10-CM | POA: Insufficient documentation

## 2020-03-04 DIAGNOSIS — E782 Mixed hyperlipidemia: Secondary | ICD-10-CM | POA: Insufficient documentation

## 2020-03-04 DIAGNOSIS — Z886 Allergy status to analgesic agent status: Secondary | ICD-10-CM | POA: Diagnosis not present

## 2020-03-04 DIAGNOSIS — Z882 Allergy status to sulfonamides status: Secondary | ICD-10-CM | POA: Insufficient documentation

## 2020-03-04 DIAGNOSIS — F419 Anxiety disorder, unspecified: Secondary | ICD-10-CM | POA: Insufficient documentation

## 2020-03-04 DIAGNOSIS — E785 Hyperlipidemia, unspecified: Secondary | ICD-10-CM

## 2020-03-04 DIAGNOSIS — I209 Angina pectoris, unspecified: Secondary | ICD-10-CM | POA: Insufficient documentation

## 2020-03-04 DIAGNOSIS — K589 Irritable bowel syndrome without diarrhea: Secondary | ICD-10-CM | POA: Diagnosis not present

## 2020-03-04 DIAGNOSIS — E1169 Type 2 diabetes mellitus with other specified complication: Secondary | ICD-10-CM | POA: Diagnosis present

## 2020-03-04 DIAGNOSIS — Z888 Allergy status to other drugs, medicaments and biological substances status: Secondary | ICD-10-CM | POA: Insufficient documentation

## 2020-03-04 DIAGNOSIS — R079 Chest pain, unspecified: Secondary | ICD-10-CM | POA: Diagnosis present

## 2020-03-04 DIAGNOSIS — E1122 Type 2 diabetes mellitus with diabetic chronic kidney disease: Secondary | ICD-10-CM | POA: Diagnosis not present

## 2020-03-04 DIAGNOSIS — N183 Chronic kidney disease, stage 3 unspecified: Secondary | ICD-10-CM | POA: Diagnosis not present

## 2020-03-04 DIAGNOSIS — Z79899 Other long term (current) drug therapy: Secondary | ICD-10-CM | POA: Diagnosis not present

## 2020-03-04 DIAGNOSIS — Z7901 Long term (current) use of anticoagulants: Secondary | ICD-10-CM | POA: Diagnosis not present

## 2020-03-04 DIAGNOSIS — I129 Hypertensive chronic kidney disease with stage 1 through stage 4 chronic kidney disease, or unspecified chronic kidney disease: Secondary | ICD-10-CM | POA: Diagnosis not present

## 2020-03-04 HISTORY — PX: LEFT HEART CATH AND CORONARY ANGIOGRAPHY: CATH118249

## 2020-03-04 LAB — GLUCOSE, CAPILLARY: Glucose-Capillary: 192 mg/dL — ABNORMAL HIGH (ref 70–99)

## 2020-03-04 SURGERY — LEFT HEART CATH AND CORONARY ANGIOGRAPHY
Anesthesia: LOCAL

## 2020-03-04 MED ORDER — HEPARIN (PORCINE) IN NACL 1000-0.9 UT/500ML-% IV SOLN
INTRAVENOUS | Status: AC
  Filled 2020-03-04: qty 1000

## 2020-03-04 MED ORDER — HEPARIN SODIUM (PORCINE) 1000 UNIT/ML IJ SOLN
INTRAMUSCULAR | Status: AC
  Filled 2020-03-04: qty 1

## 2020-03-04 MED ORDER — METHYLPREDNISOLONE SODIUM SUCC 125 MG IJ SOLR
INTRAMUSCULAR | Status: AC
  Administered 2020-03-04: 125 mg
  Filled 2020-03-04: qty 2

## 2020-03-04 MED ORDER — ACETAMINOPHEN 325 MG PO TABS: 650.0000 mg | ORAL_TABLET | ORAL | Status: DC | PRN

## 2020-03-04 MED ORDER — OXYCODONE HCL 5 MG PO TABS: 5.0000 mg | ORAL_TABLET | ORAL | Status: DC | PRN

## 2020-03-04 MED ORDER — MIDAZOLAM HCL 2 MG/2ML IJ SOLN
INTRAMUSCULAR | Status: AC
  Filled 2020-03-04: qty 2

## 2020-03-04 MED ORDER — MIDAZOLAM HCL 2 MG/2ML IJ SOLN
INTRAMUSCULAR | Status: DC | PRN
  Administered 2020-03-04 (×3): 1 mg via INTRAVENOUS

## 2020-03-04 MED ORDER — HYDRALAZINE HCL 20 MG/ML IJ SOLN: 10.0000 mg | INTRAMUSCULAR | Status: DC | PRN

## 2020-03-04 MED ORDER — SODIUM CHLORIDE 0.9 % WEIGHT BASED INFUSION
3.0000 mL/kg/h | INTRAVENOUS | Status: AC
  Administered 2020-03-04: 3 mL/kg/h via INTRAVENOUS

## 2020-03-04 MED ORDER — HEPARIN SODIUM (PORCINE) 1000 UNIT/ML IJ SOLN
INTRAMUSCULAR | Status: DC | PRN
  Administered 2020-03-04: 5500 [IU] via INTRAVENOUS

## 2020-03-04 MED ORDER — IOHEXOL 350 MG/ML SOLN
INTRAVENOUS | Status: DC | PRN
  Administered 2020-03-04: 45 mL via INTRA_ARTERIAL

## 2020-03-04 MED ORDER — HEPARIN (PORCINE) IN NACL 1000-0.9 UT/500ML-% IV SOLN
INTRAVENOUS | Status: DC | PRN
  Administered 2020-03-04 (×2): 500 mL

## 2020-03-04 MED ORDER — ONDANSETRON HCL 4 MG/2ML IJ SOLN: 4.0000 mg | Freq: Four times a day (QID) | INTRAMUSCULAR | Status: DC | PRN

## 2020-03-04 MED ORDER — VERAPAMIL HCL 2.5 MG/ML IV SOLN
INTRAVENOUS | Status: AC
  Filled 2020-03-04: qty 2

## 2020-03-04 MED ORDER — SODIUM CHLORIDE 0.9 % WEIGHT BASED INFUSION: 1.0000 mL/kg/h | INTRAVENOUS | Status: DC

## 2020-03-04 MED ORDER — SODIUM CHLORIDE 0.9 % IV SOLN: INTRAVENOUS | Status: DC

## 2020-03-04 MED ORDER — VERAPAMIL HCL 2.5 MG/ML IV SOLN
INTRAVENOUS | Status: DC | PRN
  Administered 2020-03-04: 10 mL via INTRA_ARTERIAL

## 2020-03-04 MED ORDER — ASPIRIN 81 MG PO CHEW: 81.0000 mg | CHEWABLE_TABLET | ORAL | Status: DC

## 2020-03-04 MED ORDER — DIPHENHYDRAMINE HCL 50 MG/ML IJ SOLN
25.0000 mg | Freq: Once | INTRAMUSCULAR | Status: AC
  Administered 2020-03-04: 25 mg via INTRAVENOUS

## 2020-03-04 MED ORDER — LIDOCAINE HCL (PF) 1 % IJ SOLN
INTRAMUSCULAR | Status: AC
  Filled 2020-03-04: qty 30

## 2020-03-04 MED ORDER — SODIUM CHLORIDE 0.9% FLUSH: 3.0000 mL | Freq: Two times a day (BID) | INTRAVENOUS | Status: DC

## 2020-03-04 MED ORDER — DIPHENHYDRAMINE HCL 50 MG/ML IJ SOLN
INTRAMUSCULAR | Status: AC
  Filled 2020-03-04: qty 1

## 2020-03-04 MED ORDER — LABETALOL HCL 5 MG/ML IV SOLN: 10.0000 mg | INTRAVENOUS | Status: DC | PRN

## 2020-03-04 MED ORDER — SODIUM CHLORIDE 0.9% FLUSH: 3.0000 mL | INTRAVENOUS | Status: DC | PRN

## 2020-03-04 MED ORDER — SITAGLIPTIN PHOSPHATE 50 MG PO TABS
50.0000 mg | ORAL_TABLET | Freq: Every day | ORAL | 3 refills | Status: DC
Start: 2020-03-04 — End: 2020-03-13

## 2020-03-04 MED ORDER — METHYLPREDNISOLONE SODIUM SUCC 125 MG IJ SOLR: 125.0000 mg | Freq: Once | INTRAMUSCULAR | Status: DC

## 2020-03-04 MED ORDER — FENTANYL CITRATE (PF) 100 MCG/2ML IJ SOLN
INTRAMUSCULAR | Status: DC | PRN
  Administered 2020-03-04: 12.5 ug via INTRAVENOUS
  Administered 2020-03-04: 25 ug via INTRAVENOUS

## 2020-03-04 MED ORDER — FENTANYL CITRATE (PF) 100 MCG/2ML IJ SOLN
INTRAMUSCULAR | Status: AC
  Filled 2020-03-04: qty 2

## 2020-03-04 MED ORDER — SODIUM CHLORIDE 0.9 % IV SOLN: 250.0000 mL | INTRAVENOUS | Status: DC | PRN

## 2020-03-04 MED ORDER — LIDOCAINE HCL (PF) 1 % IJ SOLN
INTRAMUSCULAR | Status: DC | PRN
  Administered 2020-03-04: 2 mL via INTRADERMAL

## 2020-03-04 SURGICAL SUPPLY — 10 items
CATH 5FR JL3.5 JR4 ANG PIG MP (CATHETERS) ×2 IMPLANT
DEVICE RAD COMP TR BAND LRG (VASCULAR PRODUCTS) ×2 IMPLANT
GLIDESHEATH SLEND A-KIT 6F 22G (SHEATH) ×2 IMPLANT
GUIDEWIRE INQWIRE 1.5J.035X260 (WIRE) ×1 IMPLANT
INQWIRE 1.5J .035X260CM (WIRE) ×2
KIT HEART LEFT (KITS) ×2 IMPLANT
PACK CARDIAC CATHETERIZATION (CUSTOM PROCEDURE TRAY) ×2 IMPLANT
SHEATH PROBE COVER 6X72 (BAG) ×2 IMPLANT
TRANSDUCER W/STOPCOCK (MISCELLANEOUS) ×2 IMPLANT
TUBING CIL FLEX 10 FLL-RA (TUBING) ×2 IMPLANT

## 2020-03-04 NOTE — Progress Notes (Signed)
Ambulated to the bathroom to void tol well  

## 2020-03-04 NOTE — Progress Notes (Signed)
Spoke with Isaac Laud states to  restart Eliquis tomorrow morning pt informed

## 2020-03-04 NOTE — Discharge Instructions (Signed)
Radial Site Care  This sheet gives you information about how to care for yourself after your procedure. Your health care provider may also give you more specific instructions. If you have problems or questions, contact your health care provider. What can I expect after the procedure? After the procedure, it is common to have:  Bruising and tenderness at the catheter insertion area. Follow these instructions at home: Medicines  Take over-the-counter and prescription medicines only as told by your health care provider. Insertion site care  Follow instructions from your health care provider about how to take care of your insertion site. Make sure you: ? Wash your hands with soap and water before you change your bandage (dressing). If soap and water are not available, use hand sanitizer. ? Change your dressing as told by your health care provider. ? Leave stitches (sutures), skin glue, or adhesive strips in place. These skin closures may need to stay in place for 2 weeks or longer. If adhesive strip edges start to loosen and curl up, you may trim the loose edges. Do not remove adhesive strips completely unless your health care provider tells you to do that.  Check your insertion site every day for signs of infection. Check for: ? Redness, swelling, or pain. ? Fluid or blood. ? Pus or a bad smell. ? Warmth.  Do not take baths, swim, or use a hot tub until your health care provider approves.  You may shower 24-48 hours after the procedure, or as directed by your health care provider. ? Remove the dressing and gently wash the site with plain soap and water. ? Pat the area dry with a clean towel. ? Do not rub the site. That could cause bleeding.  Do not apply powder or lotion to the site. Activity   For 24 hours after the procedure, or as directed by your health care provider: ? Do not flex or bend the affected arm. ? Do not push or pull heavy objects with the affected arm. ? Do not  drive yourself home from the hospital or clinic. You may drive 24 hours after the procedure unless your health care provider tells you not to. ? Do not operate machinery or power tools.  Do not lift anything that is heavier than 10 lb (4.5 kg), or the limit that you are told, until your health care provider says that it is safe.  Ask your health care provider when it is okay to: ? Return to work or school. ? Resume usual physical activities or sports. ? Resume sexual activity. General instructions  If the catheter site starts to bleed, raise your arm and put firm pressure on the site. If the bleeding does not stop, get help right away. This is a medical emergency.  If you went home on the same day as your procedure, a responsible adult should be with you for the first 24 hours after you arrive home.  Keep all follow-up visits as told by your health care provider. This is important. Contact a health care provider if:  You have a fever.  You have redness, swelling, or yellow drainage around your insertion site. Get help right away if:  You have unusual pain at the radial site.  The catheter insertion area swells very fast.  The insertion area is bleeding, and the bleeding does not stop when you hold steady pressure on the area.  Your arm or hand becomes pale, cool, tingly, or numb. These symptoms may represent a serious problem   that is an emergency. Do not wait to see if the symptoms will go away. Get medical help right away. Call your local emergency services (911 in the [place] Do not drive yourself to the hospital. Summary  After the procedure, it is common to have bruising and tenderness at the site.  Follow instructions from your health care provider about how to take care of your radial site wound. Check the wound every day for signs of infection.  Do not lift anything that is heavier than 10 lb (4.5 kg), or the limit that you are told, until your health care provider says  that it is safe. This information is not intended to replace advice given to you by your health care provider. Make sure you discuss any questions you have with your health care provider. Document Revised: 09/28/2017 Document Reviewed: 09/28/2017 Elsevier Patient Education  2020 Elsevier Inc.  

## 2020-03-04 NOTE — CV Procedure (Signed)
·   Coronary angiography with left heart cath and hemodynamic recordings via right radial using real-time vascular ultrasound for access.  Normal coronary arteries which are without evidence of calcification or plaque. Vessels were tortuous.  Normal left ventricular end-diastolic pressure. Previously documented normal LV function.

## 2020-03-04 NOTE — Interval H&P Note (Signed)
Cath Lab Visit (complete for each Cath Lab visit)  Clinical Evaluation Leading to the Procedure:   ACS: No.  Non-ACS:    Anginal Classification: CCS III  Anti-ischemic medical therapy: Minimal Therapy (1 class of medications)  Non-Invasive Test Results: No non-invasive testing performed  Prior CABG: No previous CABG      History and Physical Interval Note:  03/04/2020 10:55 AM  Paula Stout  has presented today for surgery, with the diagnosis of Angina.  The various methods of treatment have been discussed with the patient and family. After consideration of risks, benefits and other options for treatment, the patient has consented to  Procedure(s): LEFT HEART CATH AND CORONARY ANGIOGRAPHY (N/A) as a surgical intervention.  The patient's history has been reviewed, patient examined, no change in status, stable for surgery.  I have reviewed the patient's chart and labs.  Questions were answered to the patient's satisfaction.     [name]

## 2020-03-04 NOTE — Progress Notes (Signed)
Discharge instructions reviewed with pt and her daughter (via telephone) Both voice understanding.

## 2020-03-05 ENCOUNTER — Other Ambulatory Visit: Payer: 59

## 2020-03-05 ENCOUNTER — Encounter (HOSPITAL_COMMUNITY): Payer: Self-pay | Admitting: Interventional Cardiology

## 2020-03-05 MED FILL — Heparin Sod (Porcine)-NaCl IV Soln 1000 Unit/500ML-0.9%: INTRAVENOUS | Qty: 500 | Status: AC

## 2020-03-12 ENCOUNTER — Other Ambulatory Visit (INDEPENDENT_AMBULATORY_CARE_PROVIDER_SITE_OTHER): Payer: 59

## 2020-03-12 ENCOUNTER — Other Ambulatory Visit: Payer: Self-pay

## 2020-03-12 DIAGNOSIS — N183 Chronic kidney disease, stage 3 unspecified: Secondary | ICD-10-CM

## 2020-03-13 ENCOUNTER — Other Ambulatory Visit: Payer: Self-pay | Admitting: Family Medicine

## 2020-03-13 LAB — BASIC METABOLIC PANEL
BUN: 17 mg/dL (ref 6–23)
CO2: 19 mEq/L (ref 19–32)
Calcium: 9.7 mg/dL (ref 8.4–10.5)
Chloride: 110 mEq/L (ref 96–112)
Creatinine, Ser: 1.65 mg/dL — ABNORMAL HIGH (ref 0.40–1.20)
GFR: 31.47 mL/min — ABNORMAL LOW (ref >60.00)
Glucose, Bld: 316 mg/dL — ABNORMAL HIGH (ref 70–99)
Potassium: 4.6 mEq/L (ref 3.5–5.1)
Sodium: 139 mEq/L (ref 135–145)

## 2020-03-13 MED ORDER — PIOGLITAZONE HCL 30 MG PO TABS: 30.0000 mg | ORAL_TABLET | Freq: Every day | ORAL | 3 refills | Status: DC

## 2020-03-14 ENCOUNTER — Emergency Department (HOSPITAL_BASED_OUTPATIENT_CLINIC_OR_DEPARTMENT_OTHER)
Admission: EM | Admit: 2020-03-14 | Discharge: 2020-03-14 | Disposition: A | Discharge status: Home / Self Care | Payer: 59 | Attending: Emergency Medicine | Admitting: Emergency Medicine

## 2020-03-14 ENCOUNTER — Other Ambulatory Visit: Payer: Self-pay

## 2020-03-14 ENCOUNTER — Encounter (HOSPITAL_BASED_OUTPATIENT_CLINIC_OR_DEPARTMENT_OTHER): Payer: Self-pay | Admitting: *Deleted

## 2020-03-14 DIAGNOSIS — E119 Type 2 diabetes mellitus without complications: Secondary | ICD-10-CM | POA: Insufficient documentation

## 2020-03-14 DIAGNOSIS — G43909 Migraine, unspecified, not intractable, without status migrainosus: Secondary | ICD-10-CM | POA: Diagnosis not present

## 2020-03-14 DIAGNOSIS — J45909 Unspecified asthma, uncomplicated: Secondary | ICD-10-CM | POA: Diagnosis not present

## 2020-03-14 DIAGNOSIS — G43009 Migraine without aura, not intractable, without status migrainosus: Secondary | ICD-10-CM

## 2020-03-14 DIAGNOSIS — R110 Nausea: Secondary | ICD-10-CM | POA: Diagnosis not present

## 2020-03-14 DIAGNOSIS — H53149 Visual discomfort, unspecified: Secondary | ICD-10-CM | POA: Diagnosis not present

## 2020-03-14 DIAGNOSIS — N183 Chronic kidney disease, stage 3 unspecified: Secondary | ICD-10-CM | POA: Diagnosis not present

## 2020-03-14 MED ORDER — PROMETHAZINE HCL 25 MG/ML IJ SOLN
12.5000 mg | Freq: Once | INTRAMUSCULAR | Status: AC
  Administered 2020-03-14: 12.5 mg via INTRAVENOUS
  Filled 2020-03-14: qty 1

## 2020-03-14 MED ORDER — DIPHENHYDRAMINE HCL 50 MG/ML IJ SOLN
25.0000 mg | Freq: Once | INTRAMUSCULAR | Status: AC
  Administered 2020-03-14: 25 mg via INTRAVENOUS
  Filled 2020-03-14: qty 1

## 2020-03-14 MED ORDER — FENTANYL CITRATE (PF) 100 MCG/2ML IJ SOLN
50.0000 ug | Freq: Once | INTRAMUSCULAR | Status: AC
  Administered 2020-03-14: 50 ug via INTRAVENOUS
  Filled 2020-03-14: qty 2

## 2020-03-14 MED ORDER — SODIUM CHLORIDE 0.9 % IV BOLUS
500.0000 mL | Freq: Once | INTRAVENOUS | Status: AC
  Administered 2020-03-14: 500 mL via INTRAVENOUS

## 2020-03-14 NOTE — ED Notes (Signed)
Migraine x2days, max dose of meds taken, referred to ED by PCP. Pain is behind eyes and radiates toward occipital, more so towards right side

## 2020-03-14 NOTE — ED Notes (Signed)
ED Provider at bedside. 

## 2020-03-14 NOTE — ED Triage Notes (Signed)
Migraine since yesterday.

## 2020-03-14 NOTE — ED Provider Notes (Signed)
Emergency Department Provider Note   I have reviewed the triage vital signs and the nursing notes.   HISTORY  Chief Complaint Migraine   HPI Paula Stout is a 64 y.o. female with past medical history reviewed below including migraine and atrial fibrillation on anticoagulation presents emergency department for evaluation of migraine. She has had her typical migraine HA for the past 2 days.  She been taking her home migraine medications without relief.  She does not currently follow with a neurologist.  She called her PCP to ask about additional medicine she should take and was referred to the emergency department for further evaluation.  She is not experiencing fevers or chills.  She is having photophobia but no other specific vision change.  No weakness or numbness.  No speech disturbance.  She is feeling nauseated.  Headache is throbbing and behind both eyes radiating toward the back of her head.  Headache is worse on the right.  Past Medical History:  Diagnosis Date  . A-fib ([place])   . Anxiety   . Asthma   . Bipolar disorder ([place])   . Depression   . Diabetes mellitus without complication ([employer])   . Irritable bowel syndrome (IBS)   . Migraine     Patient Active Problem List   Diagnosis Date Noted  . AF (paroxysmal atrial fibrillation) ([place]) 02/20/2020  . Dyslipidemia 02/20/2020  . Chronic anticoagulation 02/20/2020  . Stage 3 chronic kidney disease 02/20/2020  . Chest pain 02/19/2020  . Chronic atrial fibrillation ([place]) 02/18/2020  . Diabetes mellitus type 2 in obese ([place]) 02/18/2020  . Migraine 02/18/2020  . Hypertriglyceridemia 02/18/2020  . Depression, recurrent ([place]) 02/18/2020  . Bipolar 1 disorder ([place]) 02/18/2020  . GAD (generalized anxiety disorder) 02/18/2020    Past Surgical History:  Procedure Laterality Date  . ABDOMINAL HYSTERECTOMY    . ATRIAL FIBRILLATION ABLATION    . CARDIAC CATHETERIZATION    . CESAREAN SECTION    . KNEE SURGERY    . LEFT  HEART CATH AND CORONARY ANGIOGRAPHY N/A 03/04/2020   Procedure: LEFT HEART CATH AND CORONARY ANGIOGRAPHY;  Surgeon: Belva Crome, MD;  Location: [facility];  Service: Cardiovascular;  Laterality: N/A;  . ROTATOR CUFF REPAIR      Allergies Bee pollen, Bee venom, Onion, Sumatriptan, Asa [aspirin], Beeswax, Nsaids, Iodine, Sulfa antibiotics, and Vancomycin  Family History  Adopted: Yes    Social History Social History   Tobacco Use  . Smoking status: Never Smoker  . Smokeless tobacco: Never Used  Vaping Use  . Vaping Use: Never used  Substance Use Topics  . Alcohol use: Never  . Drug use: Never    Review of Systems  Constitutional: No fever/chills Eyes: Positive photophobia.  ENT: No sore throat. Cardiovascular: Denies chest pain. Respiratory: Denies shortness of breath. Gastrointestinal: No abdominal pain.  Positive nausea, no vomiting.  No diarrhea.  No constipation. Genitourinary: Negative for dysuria. Musculoskeletal: Negative for back pain. Skin: Negative for rash. Neurological: Negative for focal weakness or numbness. Positive HA.   10-point ROS otherwise negative.  ____________________________________________   PHYSICAL EXAM:  VITAL SIGNS: ED Triage Vitals  Enc Vitals Group     BP 03/14/20 1831 133/79     Pulse Rate 03/14/20 1831 72     Resp 03/14/20 1831 14     Temp 03/14/20 1831 98.3 F (36.8 C)     Temp Source 03/14/20 1831 Oral     SpO2 03/14/20 1831 97 %  Weight 03/14/20 1828 240 lb (108.9 kg)     Height 03/14/20 1828 5\' 6"  (1.676 m)   Constitutional: Alert and oriented. Well appearing and in no acute distress. Sitting in a dark room with sunglasses on.  Eyes: Conjunctivae are normal. PERRL.  Head: Atraumatic. Nose: No congestion/rhinnorhea. Mouth/Throat: Mucous membranes are moist.  Neck: No stridor.  Cardiovascular: Normal rate, regular rhythm. Good peripheral circulation. Grossly normal heart sounds.   Respiratory: Normal  respiratory effort.  No retractions. Lungs CTAB. Gastrointestinal: Soft and nontender. No distention.  Musculoskeletal: No gross deformities of extremities. Neurologic:  Normal speech and language. No gross focal neurologic deficits are appreciated.  Skin:  Skin is warm, dry and intact. No rash noted.  ____________________________________________   PROCEDURES  Procedure(s) performed:   Procedures  None  ____________________________________________   INITIAL IMPRESSION / ASSESSMENT AND PLAN / ED COURSE  Pertinent labs & imaging results that were available during my care of the patient were reviewed by me and considered in my medical decision making (see chart for details).   Patient presents to the emergency department for evaluation of migraine headache typical of her headaches in the past.  She notes that her typical migraine cocktail consists of opiates with Phenergan and Benadryl.  She is not regularly in the emergency department for treatment.  She has no signs or symptoms to suspect subarachnoid hemorrhage or other intracranial hemorrhage.  She is not displaying symptoms or having vital signs consistent with infectious etiology. Denies rebound HA with opiates in the past.   Medication given in the ED with improvement in symptoms.  Provided contact information to local neurology. Discussed ED return precautions.    ____________________________________________  FINAL CLINICAL IMPRESSION(S) / ED DIAGNOSES  Final diagnoses:  Migraine without aura and without status migrainosus, not intractable     MEDICATIONS GIVEN DURING THIS VISIT:  Medications  sodium chloride 0.9 % bolus 500 mL (0 mLs Intravenous Stopped 03/14/20 2340)  promethazine (PHENERGAN) injection 12.5 mg (12.5 mg Intravenous Given 03/14/20 2227)  fentaNYL (SUBLIMAZE) injection 50 mcg (50 mcg Intravenous Given 03/14/20 2228)  diphenhydrAMINE (BENADRYL) injection 25 mg (25 mg Intravenous Given 03/14/20 2226)  fentaNYL  (SUBLIMAZE) injection 50 mcg (50 mcg Intravenous Given 03/14/20 2343)     Note:  This document was prepared using Dragon voice recognition software and may include unintentional dictation errors.  Nanda Quinton, MD, [facility]    Jilliam Bellmore, [facility], MD 03/15/20 [phone]

## 2020-03-14 NOTE — Discharge Instructions (Signed)
You have been seen in the Emergency Department (ED) for a migraine.  Please use Tylenol as needed for symptoms, but only as written on the box, and take any regular medications that have been prescribed for you. As we have discussed, please follow up with your doctor as soon as possible regarding today's ED visit and your headache symptoms.    Call your doctor or return to the Emergency Department (ED) if you have a worsening headache, sudden and severe headache, confusion, slurred speech, facial droop, weakness or numbness in any arm or leg, extreme fatigue, or other symptoms that concern you.

## 2020-03-25 MED ORDER — FENOFIBRATE 160 MG PO TABS: 160.0000 mg | ORAL_TABLET | Freq: Every day | ORAL | 1 refills | Status: DC

## 2020-03-29 ENCOUNTER — Emergency Department (HOSPITAL_BASED_OUTPATIENT_CLINIC_OR_DEPARTMENT_OTHER)
Admission: EM | Admit: 2020-03-29 | Discharge: 2020-03-29 | Disposition: A | Discharge status: Home / Self Care | Payer: 59 | Attending: Emergency Medicine | Admitting: Emergency Medicine

## 2020-03-29 ENCOUNTER — Encounter (HOSPITAL_BASED_OUTPATIENT_CLINIC_OR_DEPARTMENT_OTHER): Payer: Self-pay | Admitting: Emergency Medicine

## 2020-03-29 DIAGNOSIS — E781 Pure hyperglyceridemia: Secondary | ICD-10-CM | POA: Diagnosis not present

## 2020-03-29 DIAGNOSIS — F339 Major depressive disorder, recurrent, unspecified: Secondary | ICD-10-CM | POA: Insufficient documentation

## 2020-03-29 DIAGNOSIS — E119 Type 2 diabetes mellitus without complications: Secondary | ICD-10-CM | POA: Diagnosis not present

## 2020-03-29 DIAGNOSIS — Z8669 Personal history of other diseases of the nervous system and sense organs: Secondary | ICD-10-CM | POA: Diagnosis not present

## 2020-03-29 DIAGNOSIS — R519 Headache, unspecified: Secondary | ICD-10-CM | POA: Diagnosis present

## 2020-03-29 DIAGNOSIS — Z7901 Long term (current) use of anticoagulants: Secondary | ICD-10-CM | POA: Diagnosis not present

## 2020-03-29 DIAGNOSIS — I482 Chronic atrial fibrillation, unspecified: Secondary | ICD-10-CM | POA: Diagnosis not present

## 2020-03-29 DIAGNOSIS — Z7984 Long term (current) use of oral hypoglycemic drugs: Secondary | ICD-10-CM | POA: Insufficient documentation

## 2020-03-29 DIAGNOSIS — G43909 Migraine, unspecified, not intractable, without status migrainosus: Secondary | ICD-10-CM | POA: Diagnosis not present

## 2020-03-29 DIAGNOSIS — J449 Chronic obstructive pulmonary disease, unspecified: Secondary | ICD-10-CM | POA: Insufficient documentation

## 2020-03-29 DIAGNOSIS — F411 Generalized anxiety disorder: Secondary | ICD-10-CM | POA: Insufficient documentation

## 2020-03-29 MED ORDER — DIPHENHYDRAMINE HCL 50 MG/ML IJ SOLN
12.5000 mg | Freq: Once | INTRAMUSCULAR | Status: AC
  Administered 2020-03-29: 12.5 mg via INTRAVENOUS
  Filled 2020-03-29: qty 1

## 2020-03-29 MED ORDER — FENTANYL CITRATE (PF) 100 MCG/2ML IJ SOLN
75.0000 ug | Freq: Once | INTRAMUSCULAR | Status: AC
  Administered 2020-03-29: 75 ug via INTRAVENOUS
  Filled 2020-03-29: qty 2

## 2020-03-29 MED ORDER — DEXAMETHASONE SODIUM PHOSPHATE 10 MG/ML IJ SOLN
10.0000 mg | Freq: Once | INTRAMUSCULAR | Status: AC
  Administered 2020-03-29: 10 mg via INTRAVENOUS
  Filled 2020-03-29: qty 1

## 2020-03-29 MED ORDER — PROCHLORPERAZINE EDISYLATE 10 MG/2ML IJ SOLN
10.0000 mg | Freq: Once | INTRAMUSCULAR | Status: AC
  Administered 2020-03-29: 10 mg via INTRAVENOUS
  Filled 2020-03-29: qty 2

## 2020-03-29 NOTE — ED Triage Notes (Signed)
Migraine with vomiting x 2 days. Has taken normal migraine meds.

## 2020-03-29 NOTE — ED Provider Notes (Signed)
[facility] EMERGENCY DEPARTMENT Provider Note   CSN: [id number] Arrival date & time: 03/29/20  1713     History Chief Complaint  Patient presents with  . Headache    Paula Stout is a 64 y.o. female.  HPI   Patient presents to the ED for evaluation of a migraine headache.  Patient states she has history of migraines.  This is a typical migraine for her but it has been ongoing for the last 2 days.  States she has tried all of her home medications without relief.  This past month she has been having recurrent issues with migraines and has seen her doctor who was started on a new medication.  She denies any fevers or chills.  No numbness or weakness.  Past Medical History:  Diagnosis Date  . A-fib ([place])   . Anxiety   . Asthma   . Bipolar disorder ([place])   . Depression   . Diabetes mellitus without complication ([place])   . Irritable bowel syndrome (IBS)   . Migraine     Patient Active Problem List   Diagnosis Date Noted  . AF (paroxysmal atrial fibrillation) ([place]) 02/20/2020  . Dyslipidemia 02/20/2020  . Chronic anticoagulation 02/20/2020  . Stage 3 chronic kidney disease 02/20/2020  . Chest pain 02/19/2020  . Chronic atrial fibrillation ([place]) 02/18/2020  . Diabetes mellitus type 2 in obese ([place]) 02/18/2020  . Migraine 02/18/2020  . Hypertriglyceridemia 02/18/2020  . Depression, recurrent ([place]) 02/18/2020  . Bipolar 1 disorder ([place]) 02/18/2020  . GAD (generalized anxiety disorder) 02/18/2020    Past Surgical History:  Procedure Laterality Date  . ABDOMINAL HYSTERECTOMY    . ATRIAL FIBRILLATION ABLATION    . CARDIAC CATHETERIZATION    . CESAREAN SECTION    . KNEE SURGERY    . LEFT HEART CATH AND CORONARY ANGIOGRAPHY N/A 03/04/2020   Procedure: LEFT HEART CATH AND CORONARY ANGIOGRAPHY;  Surgeon: Belva Crome, MD;  Location: [facility];  Service: Cardiovascular;  Laterality: N/A;  . ROTATOR CUFF REPAIR       OB History   No obstetric history  on file.     Family History  Adopted: Yes    Social History   Tobacco Use  . Smoking status: Never Smoker  . Smokeless tobacco: Never Used  Vaping Use  . Vaping Use: Never used  Substance Use Topics  . Alcohol use: Never  . Drug use: Never    Home Medications Prior to Admission medications   Medication Sig Start Date End Date Taking? Authorizing Provider  albuterol (VENTOLIN HFA) 108 (90 Base) MCG/ACT inhaler Inhale 2 puffs into the lungs every 4 (four) hours. 06/08/19   [provider]  Alogliptin Benzoate 12.5 MG TABS Take 1 tablet by mouth daily. 02/18/20   [name], DO  apixaban (ELIQUIS) 5 MG TABS tablet Take 1 tablet (5 mg total) by mouth in the morning and at bedtime. 02/18/20   [name], DO  atorvastatin (LIPITOR) 80 MG tablet Take 1 tablet (80 mg total) by mouth daily at 6 PM. 02/21/20   Hosie Poisson, MD  buPROPion (WELLBUTRIN XL) 300 MG 24 hr tablet Take 1 tablet (300 mg total) by mouth daily. 02/18/20   [name], DO  butalbital-acetaminophen-caffeine (FIORICET) [phone] MG tablet Take 1 tablet by mouth every 6 (six) hours. 02/18/20   Wendling, Crosby Oyster, DO  diltiazem (CARTIA XT) 120 MG 24 hr capsule Take 1 capsule (120 mg total) by mouth daily.  02/18/20   [name], DO  fenofibrate 160 MG tablet Take 1 tablet (160 mg total) by mouth daily. 03/25/20   [name], DO  glucose blood (FREESTYLE TEST STRIPS) test strip 1 each by Other route daily. 02/18/20   [name], DO  glucose monitoring kit (FREESTYLE) monitoring kit Use daily to check blood sugar.  DX E11.9 02/18/20   [name], DO  Lancets (FREESTYLE) lancets 1 each by Other route daily. 02/18/20   [name], DO  metoprolol succinate (TOPROL-XL) 25 MG 24 hr tablet Take 2 tablets (50 mg total) by mouth daily. 02/27/20   [name], DO  nitroGLYCERIN (NITROSTAT) 0.4 MG SL tablet Place 1  tablet (0.4 mg total) under the tongue as needed. 02/18/20   [name], DO  ondansetron (ZOFRAN-ODT) 4 MG disintegrating tablet Take 1 tablet (4 mg total) by mouth every 8 (eight) hours as needed for nausea. 02/18/20   [name], DO  pantoprazole (PROTONIX) 40 MG tablet Take 1 tablet (40 mg total) by mouth daily. 02/18/20   [name], DO  pioglitazone (ACTOS) 30 MG tablet Take 1 tablet (30 mg total) by mouth daily. 03/13/20   [name], DO  QUEtiapine (SEROQUEL) 300 MG tablet Take 1 tablet (300 mg total) by mouth at bedtime. 02/18/20   [name], DO  rizatriptan (MAXALT-MLT) 10 MG disintegrating tablet Place 1 tablet under the tongue daily as needed. 01/26/18   [provider]  topiramate (TOPAMAX) 50 MG tablet Take 3 tablets (150 mg total) by mouth in the morning and at bedtime. 02/18/20   [name], DO    Allergies    Bee pollen, Bee venom, Onion, Sumatriptan, Asa [aspirin], Beeswax, Nsaids, Iodine, Sulfa antibiotics, and Vancomycin  Review of Systems   Review of Systems  All other systems reviewed and are negative.   Physical Exam Updated Vital Signs BP 109/65   Pulse 63   Temp 98.9 F (37.2 C) (Oral)   Resp 16   Ht 1.676 m (_0 )   Wt (!) 108.9 kg   SpO2 97%   BMI 38.74 kg/m   Physical Exam Vitals and nursing note reviewed.  Constitutional:      General: She is not in acute distress.    Appearance: She is well-developed.  HENT:     Head: Normocephalic and atraumatic.     Right Ear: External ear normal.     Left Ear: External ear normal.  Eyes:     General: No scleral icterus.       Right eye: No discharge.        Left eye: No discharge.     Conjunctiva/sclera: Conjunctivae normal.  Neck:     Trachea: No tracheal deviation.  Cardiovascular:     Rate and Rhythm: Normal rate and regular rhythm.  Pulmonary:     Effort: Pulmonary effort is normal. No respiratory distress.     Breath  sounds: Normal breath sounds. No stridor. No wheezing or rales.  Abdominal:     General: Bowel sounds are normal. There is no distension.     Palpations: Abdomen is soft.     Tenderness: There is no abdominal tenderness. There is no guarding or rebound.  Musculoskeletal:        General: No tenderness.     Cervical back: Neck supple.  Skin:    General: Skin is warm and dry.     Findings: No rash.  Neurological:     Mental Status: She is alert.     Cranial Nerves: No cranial nerve deficit (no facial droop, extraocular movements intact, no slurred speech).     Sensory: No sensory deficit.     Motor: No abnormal muscle tone or seizure activity.     Coordination: Coordination normal.     ED Results / Procedures / Treatments   Labs (all labs ordered are listed, but only abnormal results are displayed) Labs Reviewed - No data to display  EKG None  Radiology No results found.  Procedures Procedures (including critical care time)  Medications Ordered in ED Medications  prochlorperazine (COMPAZINE) injection 10 mg (10 mg Intravenous Given 03/29/20 1914)  diphenhydrAMINE (BENADRYL) injection 12.5 mg (12.5 mg Intravenous Given 03/29/20 1911)  fentaNYL (SUBLIMAZE) injection 75 mcg (75 mcg Intravenous Given 03/29/20 1911)  fentaNYL (SUBLIMAZE) injection 75 mcg (75 mcg Intravenous Given 03/29/20 2018)  dexamethasone (DECADRON) injection 10 mg (10 mg Intravenous Given 03/29/20 2016)    ED Course  I have reviewed the triage vital signs and the nursing notes.  Pertinent labs & imaging results that were available during my care of the patient were reviewed by me and considered in my medical decision making (see chart for details).  Clinical Course as of Mar 30 2111  Sat Mar 29, 2020  2007 Headache is improving.  Now a 5 out of 10.  Will give additional medications.   [JK]  2112 Patient states she is feeling better and is ready for discharge   [JK]    Clinical Course User Index [JK]  Dorie Rank, MD   MDM Rules/Calculators/A&P                          Patient presented to the emergency room for mention of recurrent migraine headache.  Patient did not have any findings to suggest acute infection.  No thunderclap onset to suggest [place].  No neurologic exam abnormalities to suggest stroke.  Patient was treated with a migraine cocktail.  Symptoms improved and she was ready for discharge. Final Clinical Impression(s) / ED Diagnoses Final diagnoses:  Migraine without status migrainosus, not intractable, unspecified migraine type    Rx / DC Orders ED Discharge Orders    None       Dorie Rank, MD 03/29/20 2112

## 2020-03-29 NOTE — Discharge Instructions (Addendum)
Continue your current medications.  Follow-up with your doctor for further treatment of your recurrent migraines

## 2020-03-31 ENCOUNTER — Other Ambulatory Visit: Payer: Self-pay

## 2020-03-31 ENCOUNTER — Telehealth (INDEPENDENT_AMBULATORY_CARE_PROVIDER_SITE_OTHER): Payer: 59 | Admitting: Psychiatry

## 2020-03-31 ENCOUNTER — Encounter (HOSPITAL_COMMUNITY): Payer: Self-pay | Admitting: Psychiatry

## 2020-03-31 DIAGNOSIS — F3132 Bipolar disorder, current episode depressed, moderate: Secondary | ICD-10-CM

## 2020-03-31 DIAGNOSIS — F411 Generalized anxiety disorder: Secondary | ICD-10-CM

## 2020-03-31 MED ORDER — LURASIDONE HCL 120 MG PO TABS: 120.0000 mg | ORAL_TABLET | Freq: Every evening | ORAL | 1 refills | Status: DC

## 2020-03-31 MED ORDER — ZOLPIDEM TARTRATE ER 6.25 MG PO TBCR: 6.2500 mg | EXTENDED_RELEASE_TABLET | Freq: Every evening | ORAL | 2 refills | Status: DC | PRN

## 2020-03-31 NOTE — Progress Notes (Signed)
Psychiatric Initial Adult Assessment   Patient Identification: Paula Stout MRN:  [id number] Date of Evaluation:  03/31/2020 Referral Source: Riki Sheer DO Chief Complaint:  Depression, insomnia.  Interview was conducted using videoconferencing application and I verified that I was speaking with the correct person using two identifiers. I discussed the limitations of evaluation and management by telemedicine and  the availability of in person appointments. Patient expressed understanding and agreed to proceed. Patient location - home; physician - home office.  Visit Diagnosis:    ICD-10-CM   1. GAD (generalized anxiety disorder)  F41.1   2. Bipolar 1 disorder, depressed, moderate (HCC)  F31.32     History of Present Illness:  Paula Stout is a 64 yo widowed female who comes to establish regular psychiatric follow up. She has along history of bipolar disorder amd generalized anxiety disorder. First depressive episodes occurred in middle school, mania at age 64. She used to have few manic episodes a year (last one 7 months ago) but these have became less frequent once she started on a higher dose of lurasidone. She has been followed by Dr. Lilia Pro a psychiatrist in [facility] who had her on Latuda 120 mg, Seroquel 300 mg (initiated for insomnia), Wellbutrin XL 300 mg daily. She lost her husband last year and on November 1st moved to [place] to stay with her oldest daughter. Her insurance changed and approval for [name] was denied. She still has 5 tablets left. Prior to [place] she tried unsucessfully  Lamictal, Abilify, Geodon (these are ones she remembers). Her manic episodes were described as racing thoughts, increased energy, decreased need for sleep, impulsive overspending, euphoria. Depressive episodes have been more frequent recently. She continues to struggle with insomnia both initial and middle. She has never attempted suicide, never was psychiatrically hospitalized. While in  [place] she was in counseling on monthly basis and she would like to have a therapist here as well. Paula Stout has no hx of alcohol or drug abuse. Two of her 6 children (33 yo son and 34 yo daughter have been dx with bipolar disorder).  Medical hx has been reviewed: migraine headaches (on Topamax), a. Fibrillation, obstructive sleep apnea, DM type 2, stage 3 CKD.  Associated Signs/Symptoms: Depression Symptoms:  depressed mood, fatigue, anxiety, disturbed sleep, (Hypo) Manic Symptoms:  None currently. Anxiety Symptoms:  Excessive Worry, Psychotic Symptoms:  None. PTSD Symptoms: Negative  Past Psychiatric History: see above  Previous Psychotropic Medications: Yes   Substance Abuse History in the last 12 months:  No.  Consequences of Substance Abuse: Negative  Past Medical History:  Past Medical History:  Diagnosis Date  . A-fib ([place])   . Anxiety   . Asthma   . Bipolar disorder ([place])   . Depression   . Diabetes mellitus without complication ([place])   . Irritable bowel syndrome (IBS)   . Migraine     Past Surgical History:  Procedure Laterality Date  . ABDOMINAL HYSTERECTOMY    . ATRIAL FIBRILLATION ABLATION    . CARDIAC CATHETERIZATION    . CESAREAN SECTION    . KNEE SURGERY    . LEFT HEART CATH AND CORONARY ANGIOGRAPHY N/A 03/04/2020   Procedure: LEFT HEART CATH AND CORONARY ANGIOGRAPHY;  Surgeon: Belva Crome, MD;  Location: [facility];  Service: Cardiovascular;  Laterality: N/A;  . ROTATOR CUFF REPAIR      Family Psychiatric History: Patient was adopted but two children have bipolard siorder.  Family History:  Family History  Adopted: Yes  Problem Relation Age of Onset  . Bipolar disorder Son   . Bipolar disorder Daughter     Social History:   Social History   Socioeconomic History  . Marital status: Widowed    Spouse name: Not on file  . Number of children: 6  . Years of education: Not on file  . Highest education level: Not on file   Occupational History  . Not on file  Tobacco Use  . Smoking status: Never Smoker  . Smokeless tobacco: Never Used  Vaping Use  . Vaping Use: Never used  Substance and Sexual Activity  . Alcohol use: Never  . Drug use: Never  . Sexual activity: Not Currently  Other Topics Concern  . Not on file  Social History Narrative  . Not on file   Social Determinants of Health   Financial Resource Strain:   . Difficulty of Paying Living Expenses:   Food Insecurity:   . Worried About Charity fundraiser in the Last Year:   . Arboriculturist in the Last Year:   Transportation Needs:   . Film/video editor (Medical):   [name] Lack of Transportation (Non-Medical):   Physical Activity:   . Days of Exercise per Week:   . Minutes of Exercise per Session:   Stress:   . Feeling of Stress :   Social Connections:   . Frequency of Communication with Friends and Family:   . Frequency of Social Gatherings with Friends and Family:   . Attends Religious Services:   . Active Member of Clubs or Organizations:   . Attends Archivist Meetings:   [name] Marital Status:      Allergies:   Allergies  Allergen Reactions  . Bee Pollen Anaphylaxis and Shortness Of Breath    Other reaction(s): Generalized Edema Allergic to bee stings  . Bee Venom Anaphylaxis  . Onion     Other reaction(s): Respiratory Distress  . Sumatriptan Other (See Comments)    Other reaction(s): UNKNOWN Passed out, nose bleed   . Asa [Aspirin] Nausea And Vomiting  . Beeswax   . Nsaids Nausea And Vomiting  . Iodine Rash  . Sulfa Antibiotics Rash  . Vancomycin Rash    Metabolic Disorder Labs: Lab Results  Component Value Date   HGBA1C 7.3 (H) 02/20/2020   MPG 163 02/20/2020   No results found for: PROLACTIN Lab Results  Component Value Date   CHOL 196 02/20/2020   TRIG 199 (H) 02/20/2020   HDL 39 (L) 02/20/2020   CHOLHDL 5.0 02/20/2020   VLDL 40 02/20/2020   LDLCALC NOT CALCULATED 02/20/2020   No  results found for: TSH  Therapeutic Level Labs: No results found for: LITHIUM No results found for: CBMZ No results found for: VALPROATE  Current Medications: Current Outpatient Medications  Medication Sig Dispense Refill  . albuterol (VENTOLIN HFA) 108 (90 Base) MCG/ACT inhaler Inhale 2 puffs into the lungs every 4 (four) hours.    . Alogliptin Benzoate 12.5 MG TABS Take 1 tablet by mouth daily. 90 tablet 2  . apixaban (ELIQUIS) 5 MG TABS tablet Take 1 tablet (5 mg total) by mouth in the morning and at bedtime. 180 tablet 2  . atorvastatin (LIPITOR) 80 MG tablet Take 1 tablet (80 mg total) by mouth daily at 6 PM. 30 tablet 1  . buPROPion (WELLBUTRIN XL) 300 MG 24 hr tablet Take 1 tablet (300 mg total) by mouth daily. 90 tablet 2  . butalbital-acetaminophen-caffeine (FIORICET) 50-325-40 MG  tablet Take 1 tablet by mouth every 6 (six) hours. 14 tablet 5  . diltiazem (CARTIA XT) 120 MG 24 hr capsule Take 1 capsule (120 mg total) by mouth daily. 90 capsule 2  . fenofibrate 160 MG tablet Take 1 tablet (160 mg total) by mouth daily. 90 tablet 1  . glucose blood (FREESTYLE TEST STRIPS) test strip 1 each by Other route daily. 100 each 3  . glucose monitoring kit (FREESTYLE) monitoring kit Use daily to check blood sugar.  DX E11.9 1 each 0  . Lancets (FREESTYLE) lancets 1 each by Other route daily. 100 each 3  . Lurasidone HCl 120 MG TABS Take 1 tablet (120 mg total) by mouth at bedtime. 90 tablet 1  . metoprolol succinate (TOPROL-XL) 25 MG 24 hr tablet Take 2 tablets (50 mg total) by mouth daily. 90 tablet 2  . nitroGLYCERIN (NITROSTAT) 0.4 MG SL tablet Place 1 tablet (0.4 mg total) under the tongue as needed. 25 tablet 2  . ondansetron (ZOFRAN-ODT) 4 MG disintegrating tablet Take 1 tablet (4 mg total) by mouth every 8 (eight) hours as needed for nausea. 20 tablet 2  . pantoprazole (PROTONIX) 40 MG tablet Take 1 tablet (40 mg total) by mouth daily. 90 tablet 2  . pioglitazone (ACTOS) 30 MG tablet  Take 1 tablet (30 mg total) by mouth daily. 30 tablet 3  . QUEtiapine (SEROQUEL) 300 MG tablet Take 1 tablet (300 mg total) by mouth at bedtime. 90 tablet 2  . rizatriptan (MAXALT-MLT) 10 MG disintegrating tablet Place 1 tablet under the tongue daily as needed.    . topiramate (TOPAMAX) 50 MG tablet Take 3 tablets (150 mg total) by mouth in the morning and at bedtime. 540 tablet 2  . zolpidem (AMBIEN CR) 6.25 MG CR tablet Take 1 tablet (6.25 mg total) by mouth at bedtime as needed for sleep. 30 tablet 2   No current facility-administered medications for this visit.     Psychiatric Specialty Exam: Review of Systems  Constitutional: Positive for fatigue.  Neurological: Positive for headaches.  Psychiatric/Behavioral: Positive for sleep disturbance. The patient is nervous/anxious.   All other systems reviewed and are negative.   There were no vitals taken for this visit.There is no height or weight on file to calculate BMI.  General Appearance: Casual and Fairly Groomed  Eye Contact:  Good  Speech:  Clear and Coherent and Normal Rate  Volume:  Normal  Mood:  Anxious and Depressed  Affect:  Full Range  Thought Process:  Goal Directed  Orientation:  Full (Time, Place, and Person)  Thought Content:  Logical  Suicidal Thoughts:  No  Homicidal Thoughts:  No  Memory:  Immediate;   Good Recent;   Good Remote;   Good  Judgement:  Good  Insight:  Good  Psychomotor Activity:  Normal  Concentration:  Concentration: Good  Recall:  Good  Fund of Knowledge:Good  Language: Good  Akathisia:  Negative  Handed:  Right  AIMS (if indicated):  not done  Assets:  Communication Skills Desire for Improvement Financial Resources/Insurance Housing Resilience Social Support  ADL's:  Intact  Cognition: WNL  Sleep:  Poor    Assessment and Plan: 64 yo widowed female who comes to establish regular psychiatric follow up. She has along history of bipolar disorder amd generalized anxiety disorder.  First depressive episodes occurred in middle school, mania at age 10. She used to have few manic episodes a year (last one 7 months ago) but these have became  less frequent once she started on a higher dose of lurasidone. She has been followed by Dr. Lilia Pro a psychiatrist in [facility] who had her on Latuda 120 mg, Seroquel 300 mg (initiated for insomnia), Wellbutrin XL 300 mg daily. She lost her husband last year and on November 1st moved to [place] to stay with her oldest daughter. Her insurance changed and approval for [name] was denied. She still has 5 tablets left. Prior to [place] she tried unsucessfully  Lamictal, Abilify, Geodon (these are ones she remembers). Her manic episodes were described as racing thoughts, increased energy, decreased need for sleep, impulsive overspending, euphoria. Depressive episodes have been more frequent recently. She continues to struggle with insomnia both initial and middle. She has never attempted suicide, never was psychiatrically hospitalized. While in [place] she was in counseling on monthly basis and she would like to have a therapist here as well. Paula Stout has no hx of alcohol or drug abuse. Two of her 6 children (43 yo son and 48 yo daughter have been dx with bipolar disorder).   Dx: Bipolar 1 depressed, GAD  Plan: We will continue current meds which seemed to work well enough: Wellbutrin XL 300 mg, Seroqul 300 mg at HS and we will try to get Latuda 120 mg pre-approved. Seroquel was primarily for insomnia/anxiety and did not prove to be sufficient without Latuda for mood control. I will add zolpidem CR 6.25 mg for insomnia (she denies ever being prescribed it or Lunesta). I will ask for a therapy appointment. Next visit with me in 3 months. The plan was discussed with patient who had an opportunity to ask questions and these were all answered. I spend 60 minutes in videoconferencing  with the patient and devoted approximately 50% of this time to explanation of diagnosis,  discussion of treatment options and med education.    Stephanie Acre, MD 7/26/20213:07 PM

## 2020-04-02 ENCOUNTER — Encounter: Payer: Self-pay | Admitting: Neurology

## 2020-04-02 ENCOUNTER — Ambulatory Visit (INDEPENDENT_AMBULATORY_CARE_PROVIDER_SITE_OTHER): Payer: 59 | Admitting: Neurology

## 2020-04-02 ENCOUNTER — Other Ambulatory Visit: Payer: Self-pay

## 2020-04-02 VITALS — BP 116/76 | HR 66 | Ht 66.0 in | Wt 243.0 lb

## 2020-04-02 DIAGNOSIS — E1169 Type 2 diabetes mellitus with other specified complication: Secondary | ICD-10-CM

## 2020-04-02 DIAGNOSIS — F411 Generalized anxiety disorder: Secondary | ICD-10-CM

## 2020-04-02 DIAGNOSIS — G43011 Migraine without aura, intractable, with status migrainosus: Secondary | ICD-10-CM | POA: Diagnosis not present

## 2020-04-02 DIAGNOSIS — I482 Chronic atrial fibrillation, unspecified: Secondary | ICD-10-CM | POA: Diagnosis not present

## 2020-04-02 DIAGNOSIS — N183 Chronic kidney disease, stage 3 unspecified: Secondary | ICD-10-CM

## 2020-04-02 DIAGNOSIS — E669 Obesity, unspecified: Secondary | ICD-10-CM

## 2020-04-02 MED ORDER — RIZATRIPTAN BENZOATE 10 MG PO TABS: 10.0000 mg | ORAL_TABLET | ORAL | 0 refills | Status: DC | PRN

## 2020-04-02 MED ORDER — EMGALITY 120 MG/ML ~~LOC~~ SOSY: PREFILLED_SYRINGE | SUBCUTANEOUS | 5 refills | Status: DC

## 2020-04-02 NOTE — Progress Notes (Signed)
Provider:  Larey Seat, MD  Primary Care Physician:  [name], [address] [place] [address]     Referring Provider: Ileene Rubens [address] [place],  [place] [address]          Chief Complaint according to patient   Patient presents with:     New Patient (Initial Visit)     pt states that she has suffered with migraines for years and the medication is not keeping them under control anymore. she was taking Fiorcet. She has tried metoporl, topirmate, wellbutrin, rizatripan. none of this is keeping the migraines at [facility]. she is averaging 8 migraines a month if not more and they are becoming more frequent. she has had to go to ER twice in last month because of them. she received a migraine cocktail while there (fentanyl,benadryl and phenergan)      HISTORY OF PRESENT ILLNESS:  Paula Stout is a 64  year old  Caucasian female patient is seen here upon  a referral from ed center [facility]/ ED on 04/02/2020  for a migraine evaluation. She has been screened for sleep apnea and tested negative.   Chief concern according to patient : long time migraine patient, headaches are more frequent and more intense. She indicated she is under a lot of stress.  Retired in [place] from [place] after her husband's and mother's death.  She moved to [place] to be closer to her daughter.    I have the pleasure of seeing Paula Stout today, a left-handed Caucasian female who has a past medical history of A-fib ([place]), Anxiety, Asthma, Bipolar disorder (HCC)/ Depression, Diabetes mellitus without complication ([place]), Irritable bowel syndrome (IBS), and Migraine. Akathesia.    The patient had the first sleep study in the year 2015.  Family medical history:  one SON of 3  AND 2 of her 3 Tok,   Social history:  Patient is adopted-  retired from [employer]  and lives in a household with her daughter and grandchildren.  Family  status is widowed.  Pets are present. Dogs and cats.  Tobacco use- never .  ETOH use ; seldomly ,  Caffeine intake in form of Coffee(1-2 ) Soda( 0) Tea ( 0) nor energy drinks, rarely chocolate. Regular exercise: none.   Hobbies : sewing.  Migraines are present in AM, and 3-4 days a week. No aura. Vertigo can be present.  There is nausea and emesis. Photophobia and less phonophobia. She has responded to MSG with migraines. Sleep deprivation, weather changes.  Migraines.  lasting a day when bad.  Fiorecet not helping much anymore.   Not on preventives.    Has tried propanolol, amitriptyline, she is on topamax now 150 mg bid.   Not tried emgaity , ajovy or aimovig.      Review of Systems: Out of a complete 14 system review, the patient complains of only the following symptoms, and all other reviewed systems are negative.:  Fatigue, sleepiness , snoring,  Insomnia.  On seroquel. Bipolar and insomnia treatment.   "all I want to do is sleep"  FSS endorsed at 47/ 63 points.   Social History   Socioeconomic History   Marital status: Widowed    Spouse name: Not on file   Number of children: 6   Years of education: Not on file   Highest education level: Not on file  Occupational History  Not on file  Tobacco Use   Smoking status: Never Smoker   Smokeless tobacco: Never Used  Vaping Use   Vaping Use: Never used  Substance and Sexual Activity   Alcohol use: Never   Drug use: Never   Sexual activity: Not Currently  Other Topics Concern   Not on file  Social History Narrative   Not on file   Social Determinants of Health   Financial Resource Strain:    Difficulty of Paying Living Expenses:   Food Insecurity:    Worried About Charity fundraiser in the Last Year:    Arboriculturist in the Last Year:   Transportation Needs:    Film/video editor (Medical):    Lack of Transportation (Non-Medical):   Physical Activity:    Days of Exercise per Week:     Minutes of Exercise per Session:   Stress:    Feeling of Stress :   Social Connections:    Frequency of Communication with Friends and Family:    Frequency of Social Gatherings with Friends and Family:    Attends Religious Services:    Active Member of Clubs or Organizations:    Attends Music therapist:    Marital Status:     Family History  Adopted: Yes  Problem Relation Age of Onset   Bipolar disorder Son    Bipolar disorder Daughter     Past Medical History:  Diagnosis Date   A-fib ([place])    Anxiety    Asthma    Bipolar disorder ([place])    Depression    Diabetes mellitus without complication ([place])    Irritable bowel syndrome (IBS)    Migraine     Past Surgical History:  Procedure Laterality Date   ABDOMINAL HYSTERECTOMY     ATRIAL FIBRILLATION ABLATION     CARDIAC CATHETERIZATION     CESAREAN SECTION     KNEE SURGERY     LEFT HEART CATH AND CORONARY ANGIOGRAPHY N/A 03/04/2020   Procedure: LEFT HEART CATH AND CORONARY ANGIOGRAPHY;  Surgeon: Belva Crome, MD;  Location: [facility];  Service: Cardiovascular;  Laterality: N/A;   ROTATOR CUFF REPAIR       Current Outpatient Medications on File Prior to Visit  Medication Sig Dispense Refill   albuterol (VENTOLIN HFA) 108 (90 Base) MCG/ACT inhaler Inhale 2 puffs into the lungs every 4 (four) hours.     Alogliptin Benzoate 12.5 MG TABS Take 1 tablet by mouth daily. 90 tablet 2   apixaban (ELIQUIS) 5 MG TABS tablet Take 1 tablet (5 mg total) by mouth in the morning and at bedtime. 180 tablet 2   atorvastatin (LIPITOR) 80 MG tablet Take 1 tablet (80 mg total) by mouth daily at 6 PM. 30 tablet 1   buPROPion (WELLBUTRIN XL) 300 MG 24 hr tablet Take 1 tablet (300 mg total) by mouth daily. 90 tablet 2   butalbital-acetaminophen-caffeine (FIORICET) 50-325-40 MG tablet Take 1 tablet by mouth every 6 (six) hours. 14 tablet 5   diltiazem (CARTIA XT) 120 MG 24 hr capsule Take 1  capsule (120 mg total) by mouth daily. 90 capsule 2   fenofibrate 160 MG tablet Take 1 tablet (160 mg total) by mouth daily. 90 tablet 1   glucose blood (FREESTYLE TEST STRIPS) test strip 1 each by Other route daily. 100 each 3   glucose monitoring kit (FREESTYLE) monitoring kit Use daily to check blood sugar.  DX E11.9 1 each 0  Lancets (FREESTYLE) lancets 1 each by Other route daily. 100 each 3   Lurasidone HCl 120 MG TABS Take 1 tablet (120 mg total) by mouth at bedtime. 90 tablet 1   metoprolol succinate (TOPROL-XL) 25 MG 24 hr tablet Take 2 tablets (50 mg total) by mouth daily. 90 tablet 2   nitroGLYCERIN (NITROSTAT) 0.4 MG SL tablet Place 1 tablet (0.4 mg total) under the tongue as needed. 25 tablet 2   ondansetron (ZOFRAN-ODT) 4 MG disintegrating tablet Take 1 tablet (4 mg total) by mouth every 8 (eight) hours as needed for nausea. 20 tablet 2   pantoprazole (PROTONIX) 40 MG tablet Take 1 tablet (40 mg total) by mouth daily. 90 tablet 2   pioglitazone (ACTOS) 30 MG tablet Take 1 tablet (30 mg total) by mouth daily. 30 tablet 3   QUEtiapine (SEROQUEL) 300 MG tablet Take 1 tablet (300 mg total) by mouth at bedtime. 90 tablet 2   topiramate (TOPAMAX) 50 MG tablet Take 3 tablets (150 mg total) by mouth in the morning and at bedtime. 540 tablet 2   zolpidem (AMBIEN CR) 6.25 MG CR tablet Take 1 tablet (6.25 mg total) by mouth at bedtime as needed for sleep. 30 tablet 2   No current facility-administered medications on file prior to visit.    Allergies  Allergen Reactions   Bee Venom Anaphylaxis   Onion     Other reaction(s): Respiratory Distress   Sumatriptan Other (See Comments)    Other reaction(s): UNKNOWN Passed out, nose bleed    Asa [Aspirin] Nausea And Vomiting   Nsaids Nausea And Vomiting   Iodine Rash   Sulfa Antibiotics Rash   Vancomycin Rash    Physical exam:  Today's Vitals   04/02/20 1431  BP: 116/76  Pulse: 66  Weight: (!) 243 lb (110.2 kg)   Height: '5\' 6"'  (1.676 m)   Body mass index is 39.22 kg/m.   Wt Readings from Last 3 Encounters:  04/02/20 (!) 243 lb (110.2 kg)  03/29/20 (!) 240 lb (108.9 kg)  03/14/20 240 lb (108.9 kg)     Ht Readings from Last 3 Encounters:  04/02/20 '5\' 6"'  (1.676 m)  03/29/20 '5\' 6"'  (1.676 m)  03/14/20 '5\' 6"'  (1.676 m)      General: The patient is awake, alert and appears not in acute distress. The patient is well groomed. Head: Normocephalic, atraumatic. Neck is supple. Cardiovascular:  Regular rate and cardiac rhythm by pulse,  without distended neck veins. Respiratory: Lungs are clear to auscultation.  Skin:  pale - Without evidence of ankle edema, or rash. Trunk: The patient's posture is erect.   Neurologic exam : The patient is awake and alert, oriented to place and time.   Memory subjective described as intact.  Attention span & concentration ability appears normal.  Speech is fluent,  without  dysarthria, dysphonia or aphasia.  Mood and affect are appropriate.   Cranial nerves: no loss of smell or taste reported  Pupils are equally dilated  and briskly reactive to light. Funduscopic exam deferred.   Extraocular movements in vertical and horizontal planes were intact and without nystagmus. No Diplopia. Visual fields by finger perimetry are intact. Hearing was intact to soft voice and finger rubbing.    Facial sensation intact to fine touch.  Facial motor strength is symmetric and tongue and uvula move midline.  Neck ROM : rotation, tilt and flexion extension were normal for age and shoulder shrug was symmetrical.    Motor exam:  Symmetric bulk, tone and ROM.   jittery, restless, shivering.  Normal tone without cog wheeling, symmetric grip strength .   Sensory:  Fine touch, pinprick and vibration were tested  and  normal.  Proprioception tested in the upper extremities was normal.   Coordination: Rapid alternating movements in the fingers/hands were of normal speed.  The  Finger-to-nose maneuver was intact without evidence of ataxia, dysmetria or tremor.   Gait and station: Patient could rise unassisted from a seated position, walked without assistive device.  Stance is of normal width/ base and the patient turned with 3 steps.  Toe and heel walk were deferred.  Deep tendon reflexes: in the  upper and lower extremities are symmetric and intact.  Babinski response was deferred .      After spending a total time of  45  minutes face to face and additional time for physical and neurologic examination, review of laboratory studies,  personal review of imaging studies, reports and results of other testing and review of referral information / records as far as provided in visit, I have established the following assessments:  Paula Stout is a 65 year old female patient who has recently moved from [place] to [place], now living with her daughter in her daughter's home.  She has had difficulties controlling her diabetes as she is no longer in charge of her cooking or meal preparation or shopping.  She has noted an increase in frequency and intensity of migraines which are not a new condition but an evolving condition for her.  She has been taking Fioricet but he had already tried beta-blocker topiramate which she is currently still on and she was treated her acute migraines with rizatriptan.  She is averaging 3 to 4 days of migrainous headaches per week, wakes up with morning headaches, has a BMI of over 39 and a history of diabetes and atrial fibrillation but tested negative for sleep apnea 5 years ago in [place].  At this time I think she needs to evaluations #1 she cannot receive a migraine cocktail frequently so given her underlying medical conditions it would be best to start in addition to topiramate which she already takes, is an injectable I suggest Emgality or Aimovig- I will have to check with her the bright health insurance to see which one  is the best covered.  She has indeed been on 3 or more preventatives which have failed.   #2 I do think she needs another home sleep test if her previous sleep study is 5 years ago older she still has risk factors and poor diabetes control, inability to lose weight, and socioeconomic stressors all can contribute to headaches and a higher risk of obstructive sleep apnea as well.  I will be happy to refill the triptan medication for acute headaches while slowly titrating her up to hopefully more effective preventive medication.  1) cluster migraines 2) morning headaches  3) classic migraines without aura.   High risk for OSA due to atrial fib, obesity and poor sleep quality.  Poor diabetes control.   My Plan is to proceed with:  1)emgaility ordered, went through .  2) HST ordered.  3) refilled triptan.  4) MRI brain,   I would like to thank [name], DO and [name], [address],  [place] [address] for allowing me to meet with and to take care of this pleasant patient.   In short, Paula Stout is presenting  with cluster and migraine headaches, up to 16 a month ,  symptoms that can be attributed to vascular headaches and DM and OSA.   I plan to follow up either personally or through our NP within 3 month.   CC: I will share my notes with PCP .  Electronically signed by: Larey Seat, MD 04/02/2020 2:58 PM  [facility] Board certified by The [employer] of Sleep Medicine and Diplomate of the [employer] of Sleep Medicine. Board certified In Neurology through the [place], Fellow of the [employer] of Neurology. Medical Director of [facility].

## 2020-04-02 NOTE — Patient Instructions (Addendum)
Galcanezumab injection What is this medicine? GALCANEZUMAB (gal ka NEZ ue mab) is used to prevent migraines and treat cluster headaches. This medicine may be used for other purposes; ask your health care provider or pharmacist if you have questions. COMMON BRAND NAME(S): Emgality What should I tell my health care provider before I take this medicine? They need to know if you have any of these conditions:  an unusual or allergic reaction to galcanezumab, other medicines, foods, dyes, or preservatives  pregnant or trying to get pregnant  breast-feeding How should I use this medicine? This medicine is for injection under the skin. You will be taught how to prepare and give this medicine. Use exactly as directed. Take your medicine at regular intervals. Do not take your medicine more often than directed. It is important that you put your used needles and syringes in a special sharps container. Do not put them in a trash can. If you do not have a sharps container, call your pharmacist or healthcare provider to get one. Talk to your pediatrician regarding the use of this medicine in children. Special care may be needed. Overdosage: If you think you have taken too much of this medicine contact a poison control center or emergency room at once. NOTE: This medicine is only for you. Do not share this medicine with others. What if I miss a dose? If you miss a dose, take it as soon as you can. If it is almost time for your next dose, take only that dose. Do not take double or extra doses. What may interact with this medicine? Interactions are not expected. This list may not describe all possible interactions. Give your health care provider a list of all the medicines, herbs, non-prescription drugs, or dietary supplements you use. Also tell them if you smoke, drink alcohol, or use illegal drugs. Some items may interact with your medicine. What should I watch for while using this medicine? Tell your  doctor or healthcare professional if your symptoms do not start to get better or if they get worse. What side effects may I notice from receiving this medicine? Side effects that you should report to your doctor or health care professional as soon as possible:  allergic reactions like skin rash, itching or hives, swelling of the face, lips, or tongue Side effects that usually do not require medical attention (report these to your doctor or health care professional if they continue or are bothersome):  pain, redness, or irritation at site where injected This list may not describe all possible side effects. Call your doctor for medical advice about side effects. You may report side effects to FDA at 1-800-FDA-1088. Where should I keep my medicine? Keep out of the reach of children. You will be instructed on how to store this medicine. Throw away any unused medicine after the expiration date on the label. NOTE: This sheet is a summary. It may not cover all possible information. If you have questions about this medicine, talk to your doctor, pharmacist, or health care provider.  2020 Elsevier/Gold Standard (2018-02-08 12:03:23) Recurrent Migraine Headache  Migraines are a type of headache, and they are usually stronger and more sudden than normal headaches (tension headaches). Migraines are characterized by an intense pulsing, throbbing pain that is usually only present on one side of the head. Sometimes, migraine headaches can cause nausea, vomiting, sensitivity to light and sound, and vision changes. Recurrent migraines keep coming back (recurring). A migraine can last from 4 hours up to 3  days. What are the causes? The exact cause of this condition is not known. However, a migraine may be caused when nerves in the brain become irritated and release chemicals that cause inflammation of blood vessels. This inflammation causes pain. Certain things may also trigger migraines, such as:  A disruption  in your regular eating and sleeping schedule.  Smoking.  Stress.  Menstruation.  Certain foods and drinks, such as: ? Aged cheese. ? Chocolate. ? Alcohol. ? Caffeine. ? Foods or drinks that contain nitrates, glutamate, aspartame, MSG, or tyramine.  Lack of sleep.  Hunger.  Physical exertion.  Fatigue.  High altitude.  Weather changes.  Medicines, such as: ? Nitroglycerin, which is used to treat chest pain. ? Birth control pills. ? Estrogen. ? Some blood pressure medicines. What are the signs or symptoms? Symptoms of this condition vary for each person and may include:  Pain that is usually only present on one side of the head. In some cases, the pain may be on both sides of the head or around the head or neck.  Pulsating or throbbing pain.  Severe pain that prevents daily activities.  Pain that is aggravated by any physical activity.  Nausea, vomiting, or both.  Dizziness.  Pain with exposure to bright lights, loud noises, or activity.  General sensitivity to bright lights, loud noises, or smells. Before you get a migraine, you may get warning signs that a migraine is coming (aura). An aura may include:  Seeing flashing lights.  Seeing bright spots, halos, or zigzag lines.  Having tunnel vision or blurred vision.  Having numbness or a tingling feeling.  Having trouble talking.  Having muscle weakness.  Smelling a certain odor. How is this diagnosed? This condition is often diagnosed based on:  Your symptoms and medical history.  A physical exam. You may also have tests, including:  A CT scan or MRI of your brain. These imaging tests cannot diagnose migraines, but they can help to rule out other causes of headaches.  Blood tests. How is this treated? This condition is treated with:  Medicines. These are used for: ? Lessening pain and nausea. ? Preventing recurrent migraines.  Lifestyle changes, such as changes to your diet or sleeping  patterns.  Behavior therapy, such as relaxation training or biofeedback. Biofeedback is a treatment that involves teaching you to relax and use your brain to lower your heart rate and control your breathing. Follow these instructions at home: Medicines  Take over-the-counter and prescription medicines only as told by your health care provider.  Do not drive or use heavy machinery while taking prescription pain medicine. Lifestyle  Do not use any products that contain nicotine or tobacco, such as cigarettes and e-cigarettes. If you need help quitting, ask your health care provider.  Limit alcohol intake to no more than 1 drink a day for nonpregnant women and 2 drinks a day for men. One drink equals 12 oz of beer, 5 oz of wine, or 1 oz of hard liquor.  Get 7-9 hours of sleep each night, or the amount of sleep recommended by your health care provider.  Limit your stress. Talk with your health care provider if you need help with stress management.  Maintain a healthy weight. If you need help losing weight, ask your health care provider.  Exercise regularly. Aim for 150 minutes of moderate-intensity exercise (walking, biking, yoga) or 75 minutes of vigorous exercise (running, circuit training, swimming) each week. General instructions   Keep a journal to  find out what triggers your migraine headaches so you can avoid these triggers. For example, write down: ? What you eat and drink. ? How much sleep you get. ? Any change to your diet or medicines.  Lie down in a dark, quiet room when you have a migraine.  Try placing a cool towel over your head when you have a migraine.  Keep lights dim, if bright lights bother you and make your migraines worse.  Keep all follow-up visits as told by your health care provider. This is important. Contact a health care provider if:  Your pain does not improve, even with medicine.  Your migraines continue to return, even with medicine.  You have a  fever.  You have weight loss. Get help right away if:  Your migraine becomes severe and medicine does not help.  You have a stiff neck.  You have a loss of vision.  You have muscle weakness or loss of muscle control.  You start losing your balance or have trouble walking.  You feel faint or you pass out.  You develop new, severe symptoms.  You start having abrupt severe headaches that last for a second or less, like a thunderclap. Summary  Migraine headaches are usually stronger and more sudden than normal headaches (tension headaches). Migraines are characterized by an intense pulsing, throbbing pain that is usually only present on one side of the head.  The exact cause of this condition is not known. However, a migraine may be caused when nerves in the brain become irritated and release chemicals that cause inflammation of blood vessels.  Certain things may trigger migraines, such as changes to diet or sleeping patterns, smoking, certain foods, alcohol, stress, and certain medicines.  Sometimes, migraine headaches can cause nausea, vomiting, sensitivity to light and sound, and vision changes.  Migraines are often diagnosed based on your symptoms, medical history, and a physical exam. This information is not intended to replace advice given to you by your health care provider. Make sure you discuss any questions you have with your health care provider. Document Revised: 08/26/2017 Document Reviewed: 06/04/2016 Elsevier Patient Education  2020 [employer].

## 2020-04-03 ENCOUNTER — Other Ambulatory Visit: Payer: Self-pay

## 2020-04-03 ENCOUNTER — Encounter: Payer: Self-pay | Admitting: Cardiology

## 2020-04-03 ENCOUNTER — Telehealth: Payer: Self-pay | Admitting: Radiology

## 2020-04-03 ENCOUNTER — Telehealth (HOSPITAL_COMMUNITY): Payer: Self-pay | Admitting: *Deleted

## 2020-04-03 ENCOUNTER — Ambulatory Visit (INDEPENDENT_AMBULATORY_CARE_PROVIDER_SITE_OTHER): Payer: 59 | Admitting: Cardiology

## 2020-04-03 ENCOUNTER — Telehealth: Payer: Self-pay | Admitting: Family Medicine

## 2020-04-03 VITALS — BP 110/70 | HR 64 | Ht 66.0 in | Wt 247.0 lb

## 2020-04-03 DIAGNOSIS — I480 Paroxysmal atrial fibrillation: Secondary | ICD-10-CM | POA: Diagnosis not present

## 2020-04-03 NOTE — Telephone Encounter (Signed)
Patient called stating she is unable to get her Latuda refilled and was told to call for samples.

## 2020-04-03 NOTE — Telephone Encounter (Signed)
Please advise regarding sig of metoprolol.

## 2020-04-03 NOTE — Patient Instructions (Signed)
Medication Instructions:  Your physician recommends that you continue on your current medications as directed. Please refer to the Current Medication list given to you today.  *If you need a refill on your cardiac medications before your next appointment, please call your pharmacy*   Lab Work: None Ordered If you have labs (blood work) drawn today and your tests are completely normal, you will receive your results only by: [name] MyChart Message (if you have MyChart) OR . A paper copy in the mail If you have any lab test that is abnormal or we need to change your treatment, we will call you to review the results.   Testing/Procedures: [facility] [place]- Long Term Monitor Instructions   Your physician has requested you wear your ZIO patch monitor___14____days.   This is a single patch monitor.  [employer] supplies one patch monitor per enrollment.  Additional stickers are not available.   Please do not apply patch if you will be having a Nuclear Stress Test, Echocardiogram, Cardiac CT, MRI, or Chest Xray during the time frame you would be wearing the monitor. The patch cannot be worn during these tests.  You cannot remove and re-apply the ZIO XT patch monitor.   Your ZIO patch monitor will be sent USPS Priority mail from [facility] directly to your home address. The monitor may also be mailed to a PO BOX if home delivery is not available.   It may take 3-5 days to receive your monitor after you have been enrolled.   Once you have received you monitor, please review enclosed instructions.  Your monitor has already been registered assigning a specific monitor serial # to you.   Applying the monitor   Shave hair from upper left chest.   Hold abrader disc by orange tab.  Rub abrader in 40 strokes over left upper chest as indicated in your monitor instructions.   Clean area with 4 enclosed alcohol pads .  Use all pads to assure are is cleaned thoroughly.  Let dry.   Apply patch as indicated in  monitor instructions.  Patch will be place under collarbone on left side of chest with arrow pointing upward.   Rub patch adhesive wings for 2 minutes.Remove white label marked "1".  Remove white label marked "2".  Rub patch adhesive wings for 2 additional minutes.   While looking in a mirror, press and release button in center of patch.  A small green light will flash 3-4 times .  This will be your only indicator the monitor has been turned on.     Do not shower for the first 24 hours.  You may shower after the first 24 hours.   Press button if you feel a symptom. You will hear a small click.  Record Date, Time and Symptom in the Patient Log Book.   When you are ready to remove patch, follow instructions on last 2 pages of Patient Log Book.  Stick patch monitor onto last page of Patient Log Book.   Place Patient Log Book in [place] box.  Use locking tab on box and tape box closed securely.  The [employer] has [employer] on it.  Please place in mailbox as soon as possible.  Your physician should have your test results approximately 7 days after the monitor has been mailed back to [facility].   Call [place] at [phone] if you have questions regarding your ZIO XT patch monitor.  Call them immediately if you see an  orange light blinking on your monitor.   If your monitor falls off in less than 4 days contact our Monitor department at [phone].  If your monitor becomes loose or falls off after 4 days call [employer] at [phone] for suggestions on securing your monitor.     Follow-Up: At [facility], you and your health needs are our priority.  As part of our continuing mission to provide you with exceptional heart care, we have created designated Provider Care Teams.  These Care Teams include your primary Cardiologist (physician) and Advanced Practice Providers (APPs -  Physician Assistants and Nurse Practitioners) who all work together to provide  you with the care you need, when you need it.   Your next appointment:    To be determined after your monitor  The format for your next appointment:   In Person  Provider:   Dr. Curt Bears

## 2020-04-03 NOTE — Telephone Encounter (Signed)
Pharmacist is calling in regards to metoprolol, patient is requesting a refill. Patient states frequency was changed from once a day to twice a day. Patient is out of medication because of the change.   Medication: metoprolol succinate (TOPROL-XL) 25 MG 24 hr tablet [[id number]]       Has the patient contacted their pharmacy?  (If no, request that the patient contact the pharmacy for the refill.) (If yes, when and what did the pharmacy advise?)     Preferred Pharmacy (with phone number or street name): [facility] [address] - [place], [place] - [address]  [address], [address], [place] [address]  Phone:  [phone] Fax:  [phone]  DEA #:      Agent: Please be advised that RX refills may take up to 3 business days. We ask that you follow-up with your pharmacy.

## 2020-04-03 NOTE — Telephone Encounter (Signed)
Yes we were supposed to provide her with samples; spoke with Pam about it.

## 2020-04-03 NOTE — Telephone Encounter (Signed)
Enrolled patient for a 14 day Zio monitor to be mailed to patients home.  

## 2020-04-03 NOTE — Progress Notes (Signed)
Electrophysiology Office Note   Date:  04/03/2020   ID:  Paula Stout, DOB 1955/10/31, MRN [id number]  PCP:  [name], DO  Cardiologist: Revankar Primary Electrophysiologist:  Tremont Gavitt [name], MD    Chief Complaint: Atrial fibrillation   History of Present Illness: Paula Stout is a 64 y.o. female who is being seen today for the evaluation of atrial fibrillation at the request of Revankar, Reita Cliche, MD. Presenting today for electrophysiology evaluation.  She has a history significant for paroxysmal atrial fibrillation, type 2 diabetes, hyperlipidemia, and stage III CKD.  She recently moved from [place].  She is previously had an ablation.  She was supposed to have a second ablation but this was postponed due to Covid.  She does have on and off palpitations.  She presented to [facility] June 2021 with palpitations and chest pain.  She was found to be in sinus rhythm on presentation.  She says that she does have an apple watch, but the recordings were inconclusive.  She says that her heart rates were quite fast at the time, but she converted to sinus rhythm.  For her chest pain, she had a left heart catheterization which showed no evidence of coronary artery disease.  She has had continued intermittent palpitations since that time.  Despite that, she is felt improved since her hospitalization.  Today, she denies symptoms of palpitations, chest pain, shortness of breath, orthopnea, PND, lower extremity edema, claudication, dizziness, presyncope, syncope, bleeding, or neurologic sequela. The patient is tolerating medications without difficulties.    Past Medical History:  Diagnosis Date   A-fib ([place])    Anxiety    Asthma    Bipolar disorder ([place])    Depression    Diabetes mellitus without complication (HCC)    Irritable bowel syndrome (IBS)    Migraine    Past Surgical History:  Procedure Laterality Date   ABDOMINAL HYSTERECTOMY      ATRIAL FIBRILLATION ABLATION     CARDIAC CATHETERIZATION     CESAREAN SECTION     KNEE SURGERY     LEFT HEART CATH AND CORONARY ANGIOGRAPHY N/A 03/04/2020   Procedure: LEFT HEART CATH AND CORONARY ANGIOGRAPHY;  Surgeon: Belva Crome, MD;  Location: [facility];  Service: Cardiovascular;  Laterality: N/A;   ROTATOR CUFF REPAIR       Current Outpatient Medications  Medication Sig Dispense Refill   albuterol (VENTOLIN HFA) 108 (90 Base) MCG/ACT inhaler Inhale 2 puffs into the lungs every 4 (four) hours.     Alogliptin Benzoate 12.5 MG TABS Take 1 tablet by mouth daily. 90 tablet 2   apixaban (ELIQUIS) 5 MG TABS tablet Take 1 tablet (5 mg total) by mouth in the morning and at bedtime. 180 tablet 2   atorvastatin (LIPITOR) 80 MG tablet Take 1 tablet (80 mg total) by mouth daily at 6 PM. 30 tablet 1   buPROPion (WELLBUTRIN XL) 300 MG 24 hr tablet Take 1 tablet (300 mg total) by mouth daily. 90 tablet 2   diltiazem (CARTIA XT) 120 MG 24 hr capsule Take 1 capsule (120 mg total) by mouth daily. 90 capsule 2   fenofibrate 160 MG tablet Take 1 tablet (160 mg total) by mouth daily. 90 tablet 1   Galcanezumab-gnlm (EMGALITY) 120 MG/ML SOSY Every 30 days subcutaneously. 1.12 mL 5   glucose blood (FREESTYLE TEST STRIPS) test strip 1 each by Other route daily. 100 each 3   glucose monitoring kit (FREESTYLE) monitoring kit  Use daily to check blood sugar.  DX E11.9 1 each 0   Lancets (FREESTYLE) lancets 1 each by Other route daily. 100 each 3   Lurasidone HCl 120 MG TABS Take 1 tablet (120 mg total) by mouth at bedtime. 90 tablet 1   metoprolol succinate (TOPROL-XL) 25 MG 24 hr tablet Take 2 tablets (50 mg total) by mouth daily. 90 tablet 2   nitroGLYCERIN (NITROSTAT) 0.4 MG SL tablet Place 1 tablet (0.4 mg total) under the tongue as needed. 25 tablet 2   ondansetron (ZOFRAN-ODT) 4 MG disintegrating tablet Take 1 tablet (4 mg total) by mouth every 8 (eight) hours as needed for  nausea. 20 tablet 2   pantoprazole (PROTONIX) 40 MG tablet Take 1 tablet (40 mg total) by mouth daily. 90 tablet 2   pioglitazone (ACTOS) 30 MG tablet Take 1 tablet (30 mg total) by mouth daily. 30 tablet 3   QUEtiapine (SEROQUEL) 300 MG tablet Take 1 tablet (300 mg total) by mouth at bedtime. 90 tablet 2   rizatriptan (MAXALT) 10 MG tablet Take 1 tablet (10 mg total) by mouth as needed for migraine. May repeat in 2 hours if needed 10 tablet 0   topiramate (TOPAMAX) 50 MG tablet Take 3 tablets (150 mg total) by mouth in the morning and at bedtime. 540 tablet 2   zolpidem (AMBIEN CR) 6.25 MG CR tablet Take 1 tablet (6.25 mg total) by mouth at bedtime as needed for sleep. 30 tablet 2   No current facility-administered medications for this visit.    Allergies:   Bee venom, Onion, Sumatriptan, Asa [aspirin], Nsaids, Iodine, Sulfa antibiotics, and Vancomycin   Social History:  The patient  reports that she has never smoked. She has never used smokeless tobacco. She reports that she does not drink alcohol and does not use drugs.   Family History:  The patient's family history includes Bipolar disorder in her daughter and son. She was adopted.    ROS:  Please see the history of present illness.   Otherwise, review of systems is positive for none.   All other systems are reviewed and negative.    PHYSICAL EXAM: VS:  BP 110/70    Pulse 64    Ht '5\' 6"'  (1.676 m)    Wt (!) 247 lb (112 kg)    SpO2 97%    BMI 39.87 kg/m  , BMI Body mass index is 39.87 kg/m. GEN: Well nourished, well developed, in no acute distress  HEENT: normal  Neck: no JVD, carotid bruits, or masses Cardiac: RRR; no murmurs, rubs, or gallops,no edema  Respiratory:  clear to auscultation bilaterally, normal work of breathing GI: soft, nontender, nondistended, + BS MS: no deformity or atrophy  Skin: warm and dry Neuro:  Strength and sensation are intact Psych: euthymic mood, full affect  EKG:  EKG is ordered  today. Personal review of the ekg ordered shows sinus rhythm, rate 64  Recent Labs: 02/29/2020: Hemoglobin 12.8; Platelets 341 03/12/2020: BUN 17; Creatinine, Ser 1.65; Potassium 4.6; Sodium 139    Lipid Panel     Component Value Date/Time   CHOL 196 02/20/2020 0754   TRIG 199 (H) 02/20/2020 0754   HDL 39 (L) 02/20/2020 0754   CHOLHDL 5.0 02/20/2020 0754   VLDL 40 02/20/2020 0754   LDLCALC NOT CALCULATED 02/20/2020 0754     Wt Readings from Last 3 Encounters:  04/03/20 (!) 247 lb (112 kg)  04/02/20 (!) 243 lb (110.2 kg)  03/29/20 (!) 240  lb (108.9 kg)      Other studies Reviewed: Additional studies/ records that were reviewed today include: TTE 02/20/20  Review of the above records today demonstrates:  1. Left ventricular ejection fraction, by estimation, is 60 to 65%. The  left ventricle has normal function. The left ventricle has no regional  wall motion abnormalities. Left ventricular diastolic parameters are  consistent with Grade I diastolic  dysfunction (impaired relaxation). Elevated left ventricular end-diastolic  pressure.  2. Right ventricular systolic function is normal. The right ventricular  size is normal. There is normal pulmonary artery systolic pressure. The  estimated right ventricular systolic pressure is 10.2 mmHg.  3. The mitral valve is normal in structure. No evidence of mitral valve  regurgitation. No evidence of mitral stenosis.  4. The aortic valve is normal in structure. Aortic valve regurgitation is  not visualized. No aortic stenosis is present.  5. Aortic dilatation noted. 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  6. The inferior vena cava is normal in size with greater than 50%  respiratory variability, suggesting right atrial pressure of 3 mmHg.   LHC 03/04/20  Left dominant coronary anatomy  Normal left main  Normal LAD that wraps around the left ventricular apex and supplies one half of the posterior  interventricular groove territory.  Large first diagonal/ramus intermedius that covers the distribution typically supplied by diagonal branches.  Nondominant right coronary  Dominant circumflex coronary artery.  The circumflex coronary is tortuous including the first obtuse marginal branch.  Normal left ventricular end-diastolic pressure.  Ventriculography not performed to limit contrast exposure.    ASSESSMENT AND PLAN:  1.  Paroxysmal atrial fibrillation: Currently on Eliquis, diltiazem, metoprolol.  CHA2DS2-VASc of 2.  She had a prior AF ablation 09/27/2018 with PVI and CTI ablation in [facility].  She has had continued episodes of palpitations since that time.  1 of which, she developed chest pain and went to the hospital.  She also apparently had atrial tachycardia post procedure.  To further diagnose her arrhythmias, we [employer] plan for a 2-week monitor.  She may benefit from medications or repeat ablation.  2.  Hyperlipidemia: Continue atorvastatin  Case discussed with primary cardiology  Current medicines are reviewed at length with the patient today.   The patient does not have concerns regarding her medicines.  The following changes were made today:  none  Labs/ tests ordered today include:  Orders Placed This Encounter  Procedures   LONG TERM MONITOR (3-14 DAYS)   EKG 12-Lead     Disposition:   FU with Audra Kagel 6 weeks  Signed, Hera Celaya [name], MD  04/03/2020 11:25 AM     [address] [place] [id number] [phone] (office) [phone] (fax)

## 2020-04-04 MED ORDER — METOPROLOL SUCCINATE ER 50 MG PO TB24: 50.0000 mg | ORAL_TABLET | Freq: Every day | ORAL | 3 refills | Status: DC

## 2020-04-04 NOTE — Telephone Encounter (Signed)
It was to be changed to 2 tabs daily, not 1 tab twice daily. Will send in 50 mg tabs.  Ty.

## 2020-04-04 NOTE — Telephone Encounter (Signed)
Called informed the patient prescription sent to pharmacy

## 2020-04-07 ENCOUNTER — Ambulatory Visit (INDEPENDENT_AMBULATORY_CARE_PROVIDER_SITE_OTHER): Payer: 59

## 2020-04-07 ENCOUNTER — Other Ambulatory Visit (HOSPITAL_COMMUNITY): Payer: Self-pay | Admitting: *Deleted

## 2020-04-07 ENCOUNTER — Telehealth: Payer: Self-pay | Admitting: Neurology

## 2020-04-07 DIAGNOSIS — I480 Paroxysmal atrial fibrillation: Secondary | ICD-10-CM | POA: Diagnosis not present

## 2020-04-07 NOTE — Telephone Encounter (Signed)
PA submitted through cover my meds/ [employer] KEY:[id number] We will wait for response

## 2020-04-08 ENCOUNTER — Telehealth (HOSPITAL_COMMUNITY): Payer: Self-pay

## 2020-04-08 NOTE — Telephone Encounter (Signed)
[employer] APPROVED  LATUDA (LURASIDONE) 120MG  TABLET  #30 FOR $60 EFFECTIVE 04/08/2020 TO 04/08/2021

## 2020-04-09 ENCOUNTER — Other Ambulatory Visit (HOSPITAL_COMMUNITY): Payer: Self-pay | Admitting: *Deleted

## 2020-04-09 ENCOUNTER — Telehealth: Payer: Self-pay

## 2020-04-09 MED ORDER — LURASIDONE HCL 120 MG PO TABS: 120.0000 mg | ORAL_TABLET | Freq: Every evening | ORAL | 0 refills | Status: DC

## 2020-04-09 NOTE — Telephone Encounter (Signed)
LVM for pt to call me back to schedule sleep study  

## 2020-04-10 ENCOUNTER — Telehealth: Payer: Self-pay | Admitting: Neurology

## 2020-04-10 NOTE — Telephone Encounter (Signed)
[employer] auth: [id number] (exp. 04/10/20 to 07/09/20) order sent to GI. They will reach out to the patient to schedule.

## 2020-04-10 NOTE — Telephone Encounter (Signed)
PA was originally denied. Completed appeal letter and received approval.  Approved through [place] until 04/09/2020

## 2020-04-10 NOTE — Telephone Encounter (Signed)
[employer]ing pending uloaded notes

## 2020-04-11 DIAGNOSIS — F32A Depression, unspecified: Secondary | ICD-10-CM | POA: Insufficient documentation

## 2020-04-11 DIAGNOSIS — F419 Anxiety disorder, unspecified: Secondary | ICD-10-CM | POA: Insufficient documentation

## 2020-04-11 DIAGNOSIS — K589 Irritable bowel syndrome without diarrhea: Secondary | ICD-10-CM | POA: Insufficient documentation

## 2020-04-11 DIAGNOSIS — E119 Type 2 diabetes mellitus without complications: Secondary | ICD-10-CM | POA: Insufficient documentation

## 2020-04-14 ENCOUNTER — Ambulatory Visit (INDEPENDENT_AMBULATORY_CARE_PROVIDER_SITE_OTHER): Payer: 59 | Admitting: Cardiology

## 2020-04-14 ENCOUNTER — Encounter: Payer: Self-pay | Admitting: Cardiology

## 2020-04-14 ENCOUNTER — Other Ambulatory Visit: Payer: Self-pay

## 2020-04-14 VITALS — BP 96/64 | HR 80 | Resp 18 | Ht 66.0 in | Wt 247.0 lb

## 2020-04-14 DIAGNOSIS — E782 Mixed hyperlipidemia: Secondary | ICD-10-CM

## 2020-04-14 DIAGNOSIS — E088 Diabetes mellitus due to underlying condition with unspecified complications: Secondary | ICD-10-CM

## 2020-04-14 DIAGNOSIS — Z7901 Long term (current) use of anticoagulants: Secondary | ICD-10-CM | POA: Diagnosis not present

## 2020-04-14 DIAGNOSIS — I480 Paroxysmal atrial fibrillation: Secondary | ICD-10-CM

## 2020-04-14 HISTORY — DX: Paroxysmal atrial fibrillation: I48.0

## 2020-04-14 HISTORY — DX: Diabetes mellitus due to underlying condition with unspecified complications: E08.8

## 2020-04-14 NOTE — Progress Notes (Signed)
Cardiology Office Note:    Date:  04/14/2020   ID:  Paula Stout, DOB 08/01/1956, MRN [id number]  PCP:  [name], DO  Cardiologist:  Jenean Lindau, MD   Referring MD: [name]*    ASSESSMENT:    1. Mixed hyperlipidemia   2. Chronic anticoagulation   3. Paroxysmal atrial fibrillation (HCC)   4. Diabetes mellitus due to underlying condition with unspecified complications ([place])    PLAN:    In order of problems listed above:  1. Primary prevention stressed with the patient.  Importance of compliance with diet medication stressed and she vocalized understanding. 2. Paroxymal atrial fibrillation:I discussed with the patient atrial fibrillation, disease process. Management and therapy including rate and rhythm control, anticoagulation benefits and potential risks were discussed extensively with the patient. Patient had multiple questions which were answered to patient's satisfaction. 3. Essential hypertension: Blood pressure is stable 4. Mixed dyslipidemia diabetes mellitus and obesity: I discussed diet with the patient at extensive length and she vocalized understanding.  Weight reduction was stressed. 5. I will await results of the ZIO monitoring.  Depending on the atrial fibrillation load I will consider antiarrhythmic therapy.  I discussed this with her at length and she vocalized understanding and questions were answered to her satisfaction. 6. Patient will be seen in follow-up appointment in 6 weeks or earlier if the patient has any concerns    Medication Adjustments/Labs and Tests Ordered: Current medicines are reviewed at length with the patient today.  Concerns regarding medicines are outlined above.  No orders of the defined types were placed in this encounter.  No orders of the defined types were placed in this encounter.    Chief Complaint  Patient presents with  . Follow-up    6 week FU s/p cath     History of Present Illness:     Paula Stout is a 64 y.o. female.  Patient has past medical history of essential hypertension mixed dyslipidemia diabetes mellitus and obesity.  She was evaluated for approximately fibrillation.  She has a ZIO monitor on at this time.  Her coronary angiography was unremarkable and she is happy about it.  She is getting much better as far as a domestic situation is concerned especially after the loss of her husband about a year ago.  She tells me that she feels better now.  At the time of my evaluation, the patient is alert awake oriented and in no distress.  Past Medical History:  Diagnosis Date  . A-fib ([place])   . Anxiety   . Asthma   . Atrial fibrillation ([place]) 09/01/2014   Last Assessment & Plan:  Formatting of this note might be different from the original. A:  Chronic.  Sinus rhythm at this time.  States she was told this during admission at [facility] in the past. P:  On baby aspirin at home will continue.  Low dose metoprolol started.  . Bipolar 1 disorder, depressed, moderate ([place]) 02/18/2020  . Bipolar disorder ([place])   . Cellulitis 02/03/2019  . Chest pain 02/19/2020  . Chronic anticoagulation 02/20/2020  . Chronic atrial fibrillation ([place]) 02/18/2020  . Chronic headache 10/11/2013  . Chronic migraine without aura, intractable, without status migrainosus 06/24/2017  . Chronic pain of both shoulders 11/14/2015  . Colon cancer screening 04/15/2016   Last Assessment & Plan:  Formatting of this note might be different from the original. Will schedule for colonoscopy  . Depression   . Depression, recurrent ([place]) 02/18/2020  .  Diabetes mellitus ([place])   . Diabetes mellitus without complication ([place])   . Dyslipidemia 02/20/2020  . GAD (generalized anxiety disorder) 02/18/2020  . Heartburn 04/15/2016   Last Assessment & Plan:  Formatting of this note might be different from the original. Patient was counseled regarding lifestyle modification  Advised to take Prilosec 30 mins before breakfast  Will  schedule for EGD  . History of atrial fibrillation 06/24/2017  . History of posttraumatic stress disorder (PTSD) 10/07/2016  . HLD (hyperlipidemia) 09/01/2014   Last Assessment & Plan:  Formatting of this note might be different from the original. A: Chronic. P: Continue statin.  [name] Hypertriglyceridemia 02/18/2020  . Impaired mobility and activities of daily living 11/14/2015  . Irritable bowel syndrome (IBS)   . Left elbow pain 11/14/2015  . Migraine   . Migraines 10/11/2013   Last Assessment & Plan:  Formatting of this note might be different from the original. A: Chronic. P: Continue Topamax. (also takes it for bipolar)  . Morbid obesity ([place]) 12/16/2016  . Obesity (BMI 30-39.9) 09/01/2014   Last Assessment & Plan:  Formatting of this note might be different from the original. A: Morbid obesity, with BMI of 35.0 - 39.9 P: Counseled to loose wt.  . Renal colic on left side 6/41/5830  . Sleep-disordered breathing 06/05/2018  . Unstable angina ([place]) 09/01/2014   Last Assessment & Plan:  Formatting of this note might be different from the original. A: Pt has 2 months exertional angina, recently developed angina at rest intermittently.  She had a positive nuclear medicine cardiolyte stress test with anteroapical ischemia on stress portion not seen at rest. P:  - continue aspirin, statin, metoprolol - Discussed stress test findings with cardiologist Dr. Landis Gandy    Past Surgical History:  Procedure Laterality Date  . ABDOMINAL HYSTERECTOMY    . ATRIAL FIBRILLATION ABLATION    . CARDIAC CATHETERIZATION    . CESAREAN SECTION    . KNEE SURGERY    . LEFT HEART CATH AND CORONARY ANGIOGRAPHY N/A 03/04/2020   Procedure: LEFT HEART CATH AND CORONARY ANGIOGRAPHY;  Surgeon: Belva Crome, MD;  Location: [facility];  Service: Cardiovascular;  Laterality: N/A;  . ROTATOR CUFF REPAIR      Current Medications: Current Meds  Medication Sig  . albuterol (VENTOLIN HFA) 108 (90 Base) MCG/ACT inhaler Inhale  2 puffs into the lungs every 4 (four) hours as needed for wheezing.   . Alogliptin Benzoate 12.5 MG TABS Take 1 tablet by mouth daily.  [name] apixaban (ELIQUIS) 5 MG TABS tablet Take 1 tablet (5 mg total) by mouth in the morning and at bedtime.  [name] atorvastatin (LIPITOR) 80 MG tablet Take 1 tablet (80 mg total) by mouth daily at 6 PM.  . buPROPion (WELLBUTRIN XL) 300 MG 24 hr tablet Take 1 tablet (300 mg total) by mouth daily.  [name] diltiazem (CARTIA XT) 120 MG 24 hr capsule Take 1 capsule (120 mg total) by mouth daily.  . fenofibrate 160 MG tablet Take 1 tablet (160 mg total) by mouth daily.  . Galcanezumab-gnlm (EMGALITY) 120 MG/ML SOSY Every 30 days subcutaneously.  [name] glucose blood (FREESTYLE TEST STRIPS) test strip 1 each by Other route daily.  [name] glucose monitoring kit (FREESTYLE) monitoring kit Use daily to check blood sugar.  DX E11.9  . Lancets (FREESTYLE) lancets 1 each by Other route daily.  . Lurasidone HCl 120 MG TABS Take 1 tablet (120 mg total) by mouth at bedtime.  . metoprolol  succinate (TOPROL-XL) 50 MG 24 hr tablet Take 1 tablet (50 mg total) by mouth daily. Take with or immediately following a meal.  . nitroGLYCERIN (NITROSTAT) 0.4 MG SL tablet Place 1 tablet (0.4 mg total) under the tongue as needed.  . ondansetron (ZOFRAN-ODT) 4 MG disintegrating tablet Take 1 tablet (4 mg total) by mouth every 8 (eight) hours as needed for nausea.  . pantoprazole (PROTONIX) 40 MG tablet Take 1 tablet (40 mg total) by mouth daily.  . pioglitazone (ACTOS) 30 MG tablet Take 1 tablet (30 mg total) by mouth daily.  . QUEtiapine (SEROQUEL) 300 MG tablet Take 1 tablet (300 mg total) by mouth at bedtime.  . rizatriptan (MAXALT) 10 MG tablet Take 1 tablet (10 mg total) by mouth as needed for migraine. May repeat in 2 hours if needed  . topiramate (TOPAMAX) 50 MG tablet Take 3 tablets (150 mg total) by mouth in the morning and at bedtime.  [name] zolpidem (AMBIEN CR) 6.25 MG CR tablet Take 1 tablet (6.25 mg total)  by mouth at bedtime as needed for sleep.     Allergies:   Bee venom, Onion, Sumatriptan, Asa [aspirin], Nsaids, Iodine, Sulfa antibiotics, and Vancomycin   Social History   Socioeconomic History  . Marital status: Widowed    Spouse name: Not on file  . Number of children: 6  . Years of education: Not on file  . Highest education level: Not on file  Occupational History  . Not on file  Tobacco Use  . Smoking status: Never Smoker  . Smokeless tobacco: Never Used  Vaping Use  . Vaping Use: Never used  Substance and Sexual Activity  . Alcohol use: Never  . Drug use: Never  . Sexual activity: Not Currently  Other Topics Concern  . Not on file  Social History Narrative  . Not on file   Social Determinants of Health   Financial Resource Strain:   . Difficulty of Paying Living Expenses:   Food Insecurity:   . Worried About Charity fundraiser in the Last Year:   . Arboriculturist in the Last Year:   Transportation Needs:   . Film/video editor (Medical):   [name] Lack of Transportation (Non-Medical):   Physical Activity:   . Days of Exercise per Week:   . Minutes of Exercise per Session:   Stress:   . Feeling of Stress :   Social Connections:   . Frequency of Communication with Friends and Family:   . Frequency of Social Gatherings with Friends and Family:   . Attends Religious Services:   . Active Member of Clubs or Organizations:   . Attends Archivist Meetings:   [name] Marital Status:      Family History: The patient's family history includes Bipolar disorder in her daughter and son. She was adopted.  ROS:   Please see the history of present illness.    All other systems reviewed and are negative.  EKGs/Labs/Other Studies Reviewed:    The following studies were reviewed today: Belva Crome, MD (Primary)    Procedures  LEFT HEART CATH AND CORONARY ANGIOGRAPHY  Conclusion   Left dominant coronary anatomy  Normal left main  Normal LAD that  wraps around the left ventricular apex and supplies one half of the posterior interventricular groove territory.  Large first diagonal/ramus intermedius that covers the distribution typically supplied by diagonal branches.  Nondominant right coronary  Dominant circumflex coronary artery.  The circumflex coronary is  tortuous including the first obtuse marginal branch.  Normal left ventricular end-diastolic pressure.  Ventriculography not performed to limit contrast exposure.  RECOMMENDATIONS:   Data will be forwarded to Dr. Geraldo Pitter    Recent Labs: 02/29/2020: Hemoglobin 12.8; Platelets 341 03/12/2020: BUN 17; Creatinine, Ser 1.65; Potassium 4.6; Sodium 139  Recent Lipid Panel    Component Value Date/Time   CHOL 196 02/20/2020 0754   TRIG 199 (H) 02/20/2020 0754   HDL 39 (L) 02/20/2020 0754   CHOLHDL 5.0 02/20/2020 0754   VLDL 40 02/20/2020 0754   LDLCALC NOT CALCULATED 02/20/2020 0754    Physical Exam:    VS:  BP 96/64 (BP Location: Right Arm, Patient Position: Sitting, Cuff Size: Normal)   Pulse 80   Resp 18   Ht '5\' 6"'  (1.676 m)   Wt 247 lb (112 kg)   SpO2 97%   BMI 39.87 kg/m     Wt Readings from Last 3 Encounters:  04/14/20 247 lb (112 kg)  04/03/20 (!) 247 lb (112 kg)  04/02/20 (!) 243 lb (110.2 kg)     GEN: Patient is in no acute distress HEENT: Normal NECK: No JVD; No carotid bruits LYMPHATICS: No lymphadenopathy CARDIAC: Hear sounds regular, 2/6 systolic murmur at the apex. RESPIRATORY:  Clear to auscultation without rales, wheezing or rhonchi  ABDOMEN: Soft, non-tender, non-distended MUSCULOSKELETAL:  No edema; No deformity  SKIN: Warm and dry NEUROLOGIC:  Alert and oriented x 3 PSYCHIATRIC:  Normal affect   Signed, Jenean Lindau, MD  04/14/2020 11:55 AM    [place]

## 2020-04-14 NOTE — Patient Instructions (Signed)
Medication Instructions:  No medication changes. *If you need a refill on your cardiac medications before your next appointment, please call your pharmacy*   Lab Work: None ordered If you have labs (blood work) drawn today and your tests are completely normal, you will receive your results only by: [name] MyChart Message (if you have MyChart) OR . A paper copy in the mail If you have any lab test that is abnormal or we need to change your treatment, we will call you to review the results.   Testing/Procedures: None ordered   Follow-Up: At [facility], you and your health needs are our priority.  As part of our continuing mission to provide you with exceptional heart care, we have created designated Provider Care Teams.  These Care Teams include your primary Cardiologist (physician) and Advanced Practice Providers (APPs -  Physician Assistants and Nurse Practitioners) who all work together to provide you with the care you need, when you need it.  We recommend signing up for the patient portal called "MyChart".  Sign up information is provided on this After Visit Summary.  MyChart is used to connect with patients for Virtual Visits (Telemedicine).  Patients are able to view lab/test results, encounter notes, upcoming appointments, etc.  Non-urgent messages can be sent to your provider as well.   To learn more about what you can do with MyChart, go to [email or web address].    Your next appointment:   6 week(s)  The format for your next appointment:   In Person  Provider:   Jyl Heinz, MD   Other Instructions NA

## 2020-04-17 ENCOUNTER — Other Ambulatory Visit: Payer: Self-pay | Admitting: Cardiology

## 2020-04-17 DIAGNOSIS — I482 Chronic atrial fibrillation, unspecified: Secondary | ICD-10-CM

## 2020-04-22 ENCOUNTER — Emergency Department (HOSPITAL_BASED_OUTPATIENT_CLINIC_OR_DEPARTMENT_OTHER): Payer: 59

## 2020-04-22 ENCOUNTER — Encounter (HOSPITAL_BASED_OUTPATIENT_CLINIC_OR_DEPARTMENT_OTHER): Payer: Self-pay

## 2020-04-22 ENCOUNTER — Other Ambulatory Visit: Payer: Self-pay

## 2020-04-22 ENCOUNTER — Emergency Department (HOSPITAL_BASED_OUTPATIENT_CLINIC_OR_DEPARTMENT_OTHER)
Admission: EM | Admit: 2020-04-22 | Discharge: 2020-04-23 | Disposition: A | Discharge status: Home / Self Care | Payer: 59 | Attending: Emergency Medicine | Admitting: Emergency Medicine

## 2020-04-22 ENCOUNTER — Ambulatory Visit
Admission: RE | Admit: 2020-04-22 | Discharge: 2020-04-22 | Disposition: A | Discharge status: Home / Self Care | Payer: 59 | Source: Ambulatory Visit | Attending: Neurology | Admitting: Neurology

## 2020-04-22 DIAGNOSIS — E119 Type 2 diabetes mellitus without complications: Secondary | ICD-10-CM | POA: Diagnosis not present

## 2020-04-22 DIAGNOSIS — G43409 Hemiplegic migraine, not intractable, without status migrainosus: Secondary | ICD-10-CM | POA: Diagnosis not present

## 2020-04-22 DIAGNOSIS — R519 Headache, unspecified: Secondary | ICD-10-CM | POA: Diagnosis present

## 2020-04-22 DIAGNOSIS — J45909 Unspecified asthma, uncomplicated: Secondary | ICD-10-CM | POA: Insufficient documentation

## 2020-04-22 DIAGNOSIS — N183 Chronic kidney disease, stage 3 unspecified: Secondary | ICD-10-CM

## 2020-04-22 DIAGNOSIS — R079 Chest pain, unspecified: Secondary | ICD-10-CM | POA: Insufficient documentation

## 2020-04-22 DIAGNOSIS — Z7951 Long term (current) use of inhaled steroids: Secondary | ICD-10-CM | POA: Insufficient documentation

## 2020-04-22 DIAGNOSIS — E1169 Type 2 diabetes mellitus with other specified complication: Secondary | ICD-10-CM

## 2020-04-22 DIAGNOSIS — G43011 Migraine without aura, intractable, with status migrainosus: Secondary | ICD-10-CM

## 2020-04-22 DIAGNOSIS — I482 Chronic atrial fibrillation, unspecified: Secondary | ICD-10-CM

## 2020-04-22 DIAGNOSIS — E669 Obesity, unspecified: Secondary | ICD-10-CM

## 2020-04-22 LAB — TROPONIN I (HIGH SENSITIVITY): Troponin I (High Sensitivity): 3 ng/L (ref <18)

## 2020-04-22 LAB — BASIC METABOLIC PANEL
Anion gap: 10 (ref 5–15)
BUN: 22 mg/dL (ref 8–23)
CO2: 20 mmol/L — ABNORMAL LOW (ref 22–32)
Calcium: 9.5 mg/dL (ref 8.9–10.3)
Chloride: 110 mmol/L (ref 98–111)
Creatinine, Ser: 1.61 mg/dL — ABNORMAL HIGH (ref 0.44–1.00)
GFR calc Af Amer: 39 mL/min — ABNORMAL LOW (ref >60)
GFR calc non Af Amer: 34 mL/min — ABNORMAL LOW (ref >60)
Glucose, Bld: 162 mg/dL — ABNORMAL HIGH (ref 70–99)
Potassium: 3.9 mmol/L (ref 3.5–5.1)
Sodium: 140 mmol/L (ref 135–145)

## 2020-04-22 LAB — CBC
HCT: 39.3 % (ref 36.0–46.0)
Hemoglobin: 12.5 g/dL (ref 12.0–15.0)
MCH: 29.5 pg (ref 26.0–34.0)
MCHC: 31.8 g/dL (ref 30.0–36.0)
MCV: 92.7 fL (ref 80.0–100.0)
Platelets: 348 10*3/uL (ref 150–400)
RBC: 4.24 MIL/uL (ref 3.87–5.11)
RDW: 14.1 % (ref 11.5–15.5)
WBC: 11 10*3/uL — ABNORMAL HIGH (ref 4.0–10.5)
nRBC: 0 % (ref 0.0–0.2)

## 2020-04-22 MED ORDER — FENTANYL CITRATE (PF) 100 MCG/2ML IJ SOLN
100.0000 ug | Freq: Once | INTRAMUSCULAR | Status: AC | PRN
  Administered 2020-04-22: 100 ug via INTRAVENOUS
  Filled 2020-04-22: qty 2

## 2020-04-22 MED ORDER — METOCLOPRAMIDE HCL 5 MG/ML IJ SOLN
10.0000 mg | Freq: Once | INTRAMUSCULAR | Status: AC
  Administered 2020-04-22: 10 mg via INTRAVENOUS
  Filled 2020-04-22: qty 2

## 2020-04-22 MED ORDER — KETOROLAC TROMETHAMINE 15 MG/ML IJ SOLN
15.0000 mg | Freq: Once | INTRAMUSCULAR | Status: AC
  Administered 2020-04-22: 15 mg via INTRAVENOUS
  Filled 2020-04-22: qty 1

## 2020-04-22 MED ORDER — SODIUM CHLORIDE 0.9 % IV BOLUS
1000.0000 mL | Freq: Once | INTRAVENOUS | Status: AC
  Administered 2020-04-22: 1000 mL via INTRAVENOUS

## 2020-04-22 MED ORDER — DIPHENHYDRAMINE HCL 50 MG/ML IJ SOLN
25.0000 mg | Freq: Once | INTRAMUSCULAR | Status: AC
  Administered 2020-04-22: 25 mg via INTRAVENOUS
  Filled 2020-04-22: qty 1

## 2020-04-22 NOTE — ED Provider Notes (Signed)
[facility] Provider Note: Georgena Spurling, MD, FACEP  CSN: [id number] MRN: [id number] ARRIVAL: 04/22/20 at [place]: [place]  Migraine   HISTORY OF PRESENT ILLNESS  04/22/20 10:59 PM Paula Stout is a 64 y.o. female with a history of migraines.  She is here with a migraine headache for the past 2 days.  She describes it as feeling like a sharp poker through her right eye into the back of her head.  This is typical of her migraines.  She has had associated photophobia and nausea but no vomiting.  She rates her pain as an 8 or 9 out of 10.  She has taken the maximum dose of Maxalt and is also on Emgality.  She is not having any focal neurologic deficits.  She is having chest pain which started about 30 min prior to arrival but states this is a chronic problem for her.  She had a cardiac catheterization on March 04, 2020 which showed no occlusive disease.  She had an MRI of the brain earlier today which has not yet been read.   Past Medical History:  Diagnosis Date  . A-fib ([place])   . Anxiety   . Asthma   . Atrial fibrillation ([place]) 09/01/2014   Last Assessment & Plan:  Formatting of this note might be different from the original. A:  Chronic.  Sinus rhythm at this time.  States she was told this during admission at [facility] in the past. P:  On baby aspirin at home will continue.  Low dose metoprolol started.  . Bipolar 1 disorder, depressed, moderate ([place]) 02/18/2020  . Bipolar disorder ([place])   . Cellulitis 02/03/2019  . Chest pain 02/19/2020  . Chronic anticoagulation 02/20/2020  . Chronic atrial fibrillation ([place]) 02/18/2020  . Chronic headache 10/11/2013  . Chronic migraine without aura, intractable, without status migrainosus 06/24/2017  . Chronic pain of both shoulders 11/14/2015  . Colon cancer screening 04/15/2016   Last Assessment & Plan:  Formatting of this note might be different from the original. Will schedule for colonoscopy  . Depression   .  Depression, recurrent ([place]) 02/18/2020  . Diabetes mellitus ([place])   . Diabetes mellitus without complication ([place])   . Dyslipidemia 02/20/2020  . GAD (generalized anxiety disorder) 02/18/2020  . Heartburn 04/15/2016   Last Assessment & Plan:  Formatting of this note might be different from the original. Patient was counseled regarding lifestyle modification  Advised to take Prilosec 30 mins before breakfast  Will schedule for EGD  . History of atrial fibrillation 06/24/2017  . History of posttraumatic stress disorder (PTSD) 10/07/2016  . HLD (hyperlipidemia) 09/01/2014   Last Assessment & Plan:  Formatting of this note might be different from the original. A: Chronic. P: Continue statin.  [name] Hypertriglyceridemia 02/18/2020  . Impaired mobility and activities of daily living 11/14/2015  . Irritable bowel syndrome (IBS)   . Left elbow pain 11/14/2015  . Migraine   . Migraines 10/11/2013   Last Assessment & Plan:  Formatting of this note might be different from the original. A: Chronic. P: Continue Topamax. (also takes it for bipolar)  . Morbid obesity () 12/16/2016  . Obesity (BMI 30-39.9) 09/01/2014   Last Assessment & Plan:  Formatting of this note might be different from the original. A: Morbid obesity, with BMI of 35.0 - 39.9 P: Counseled to loose wt.  . Renal colic on left side 4/68/0321  . Sleep-disordered breathing 06/05/2018  . Unstable  angina ([place]) 09/01/2014   Last Assessment & Plan:  Formatting of this note might be different from the original. A: Pt has 2 months exertional angina, recently developed angina at rest intermittently.  She had a positive nuclear medicine cardiolyte stress test with anteroapical ischemia on stress portion not seen at rest. P:  - continue aspirin, statin, metoprolol - Discussed stress test findings with cardiologist Dr. Landis Gandy    Past Surgical History:  Procedure Laterality Date  . ABDOMINAL HYSTERECTOMY    . ATRIAL FIBRILLATION ABLATION    . CARDIAC  CATHETERIZATION    . CESAREAN SECTION    . KNEE SURGERY    . LEFT HEART CATH AND CORONARY ANGIOGRAPHY N/A 03/04/2020   Procedure: LEFT HEART CATH AND CORONARY ANGIOGRAPHY;  Surgeon: Belva Crome, MD;  Location: [facility];  Service: Cardiovascular;  Laterality: N/A;  . ROTATOR CUFF REPAIR      Family History  Adopted: Yes  Problem Relation Age of Onset  . Bipolar disorder Son   . Bipolar disorder Daughter     Social History   Tobacco Use  . Smoking status: Never Smoker  . Smokeless tobacco: Never Used  Vaping Use  . Vaping Use: Never used  Substance Use Topics  . Alcohol use: Never  . Drug use: Never    Prior to Admission medications   Medication Sig Start Date End Date Taking? Authorizing Provider  albuterol (VENTOLIN HFA) 108 (90 Base) MCG/ACT inhaler Inhale 2 puffs into the lungs every 4 (four) hours as needed for wheezing.  06/08/19   [provider]  Alogliptin Benzoate 12.5 MG TABS Take 1 tablet by mouth daily. 02/18/20   [name], DO  apixaban (ELIQUIS) 5 MG TABS tablet Take 1 tablet (5 mg total) by mouth in the morning and at bedtime. 02/18/20   [name], DO  atorvastatin (LIPITOR) 80 MG tablet Take 1 tablet (80 mg total) by mouth daily at 6 PM. 02/21/20   Hosie Poisson, MD  buPROPion (WELLBUTRIN XL) 300 MG 24 hr tablet Take 1 tablet (300 mg total) by mouth daily. 02/18/20   Wendling, Crosby Oyster, DO  diltiazem (CARTIA XT) 120 MG 24 hr capsule Take 1 capsule (120 mg total) by mouth daily. 02/18/20   [name], DO  fenofibrate 160 MG tablet Take 1 tablet (160 mg total) by mouth daily. 03/25/20   Wendling, Crosby Oyster, DO  Galcanezumab-gnlm (EMGALITY) 120 MG/ML SOSY Every 30 days subcutaneously. 04/02/20   Dohmeier, Asencion Partridge, MD  glucose blood (FREESTYLE TEST STRIPS) test strip 1 each by Other route daily. 02/18/20   [name], DO  glucose monitoring kit (FREESTYLE) monitoring kit Use daily to check blood  sugar.  DX E11.9 02/18/20   [name], DO  Lancets (FREESTYLE) lancets 1 each by Other route daily. 02/18/20   [name], DO  Lurasidone HCl 120 MG TABS Take 1 tablet (120 mg total) by mouth at bedtime. 04/08/20 10/05/20  Pucilowski, Marchia Bond, MD  metoprolol succinate (TOPROL-XL) 50 MG 24 hr tablet Take 1 tablet (50 mg total) by mouth daily. Take with or immediately following a meal. 04/04/20   Wendling, Crosby Oyster, DO  nitroGLYCERIN (NITROSTAT) 0.4 MG SL tablet Place 1 tablet (0.4 mg total) under the tongue as needed. 02/18/20   [name], DO  ondansetron (ZOFRAN-ODT) 4 MG disintegrating tablet Take 1 tablet (4 mg total) by mouth every 8 (eight) hours as needed for nausea. 02/18/20   Wendling,  Crosby Oyster, DO  pantoprazole (PROTONIX) 40 MG tablet Take 1 tablet (40 mg total) by mouth daily. 02/18/20   [name], DO  pioglitazone (ACTOS) 30 MG tablet Take 1 tablet (30 mg total) by mouth daily. 03/13/20   [name], DO  QUEtiapine (SEROQUEL) 300 MG tablet Take 1 tablet (300 mg total) by mouth at bedtime. 02/18/20   [name], DO  rizatriptan (MAXALT) 10 MG tablet Take 1 tablet (10 mg total) by mouth as needed for migraine. May repeat in 2 hours if needed 04/02/20   Dohmeier, Asencion Partridge, MD  topiramate (TOPAMAX) 50 MG tablet Take 3 tablets (150 mg total) by mouth in the morning and at bedtime. 02/18/20   Wendling, Crosby Oyster, DO  zolpidem (AMBIEN CR) 6.25 MG CR tablet Take 1 tablet (6.25 mg total) by mouth at bedtime as needed for sleep. 03/31/20   Pucilowski, Marchia Bond, MD    Allergies Bee venom, Onion, Sumatriptan, Asa [aspirin], Nsaids, Iodine, Sulfa antibiotics, and Vancomycin   REVIEW OF SYSTEMS  Negative except as noted here or in the History of Present Illness.   PHYSICAL EXAMINATION  Initial Vital Signs Blood pressure 120/66, pulse 84, temperature 98.2 F (36.8 C), temperature source Oral, resp. rate 18, height 5'  6" (1.676 m), weight 110.2 kg, SpO2 100 %.  Examination General: Well-developed, well-nourished female in no acute distress; appearance consistent with age of record HENT: normocephalic; atraumatic Eyes: pupils equal, round and reactive to light; extraocular muscles intact Neck: supple Heart: regular rate and rhythm Lungs: clear to auscultation bilaterally Abdomen: soft; nondistended; nontender; bowel sounds present Extremities: No deformity; full range of motion; pulses normal Neurologic: Awake, alert and oriented; motor function intact in all extremities and symmetric; no facial droop Skin: Warm and dry Psychiatric: Normal mood and affect   RESULTS  Summary of this visit's results, reviewed and interpreted by myself:   EKG Interpretation  Date/Time:  Tuesday April 22 2020 17:20:40 EDT Ventricular Rate:  76 PR Interval:  190 QRS Duration: 88 QT Interval:  376 QTC Calculation: 423 R Axis:   26 Text Interpretation: Normal sinus rhythm Nonspecific ST and T wave abnormality Abnormal ECG When compared to prior, similar appearnce. No STEMI Confirmed by Antony Blackbird [id number]) on 04/22/2020 9:08:11 PM      Laboratory Studies: Results for orders placed or performed during the hospital encounter of 04/22/20 (from the past 24 hour(s))  Basic metabolic panel     Status: Abnormal   Collection Time: 04/22/20  5:26 PM  Result Value Ref Range   Sodium 140 135 - 145 mmol/L   Potassium 3.9 3.5 - 5.1 mmol/L   Chloride 110 98 - 111 mmol/L   CO2 20 (L) 22 - 32 mmol/L   Glucose, Bld 162 (H) 70 - 99 mg/dL   BUN 22 8 - 23 mg/dL   Creatinine, Ser 1.61 (H) 0.44 - 1.00 mg/dL   Calcium 9.5 8.9 - 10.3 mg/dL   GFR calc non Af Amer 34 (L) >60 mL/min   GFR calc Af Amer 39 (L) >60 mL/min   Anion gap 10 5 - 15  CBC     Status: Abnormal   Collection Time: 04/22/20  5:26 PM  Result Value Ref Range   WBC 11.0 (H) 4.0 - 10.5 K/uL   RBC 4.24 3.87 - 5.11 MIL/uL   Hemoglobin 12.5 12.0 - 15.0 g/dL   HCT  39.3 36 - 46 %   MCV 92.7 80.0 - 100.0 fL  MCH 29.5 26.0 - 34.0 pg   MCHC 31.8 30.0 - 36.0 g/dL   RDW 14.1 11.5 - 15.5 %   Platelets 348 150 - 400 K/uL   nRBC 0.0 0.0 - 0.2 %  Troponin I (High Sensitivity)     Status: None   Collection Time: 04/22/20  5:26 PM  Result Value Ref Range   Troponin I (High Sensitivity) 3 <18 ng/L   Imaging Studies: DG Chest 2 View  Result Date: 04/22/2020 CLINICAL DATA:  Chest pain. Additional history provided: Patient reports migraine for 2 days, chest pain, atrial fibrillation, diabetes mellitus EXAM: CHEST - 2 VIEW COMPARISON:  Prior chest radiographs 02/19/2020 and earlier FINDINGS: Heart size within normal limits. No appreciable airspace consolidation or pulmonary edema. No evidence of pleural effusion or pneumothorax. No acute bony abnormality identified. Thoracic spondylosis. IMPRESSION: No evidence of active cardiopulmonary disease. Electronically Signed   By: Kellie Simmering DO   On: 04/22/2020 17:55    ED COURSE and MDM  Nursing notes, initial and subsequent vitals signs, including pulse oximetry, reviewed and interpreted by myself.  Vitals:   04/22/20 1719 04/22/20 2107 04/22/20 2300 04/22/20 2330  BP: 123/77 120/66 115/66 124/72  Pulse: 76 84 80 82  Resp: '18 18 15 16  ' Temp: 98.2 F (36.8 C) 98.2 F (36.8 C)  98.4 F (36.9 C)  TempSrc: Oral Oral    SpO2: 98% 100% 98% 98%  Weight: 110.2 kg     Height: '5\' 6"'  (1.676 m)      Medications  sodium chloride 0.9 % bolus 1,000 mL (1,000 mLs Intravenous New Bag/Given 04/22/20 2320)  diphenhydrAMINE (BENADRYL) injection 25 mg (25 mg Intravenous Given 04/22/20 2321)  metoCLOPramide (REGLAN) injection 10 mg (10 mg Intravenous Given 04/22/20 2322)  ketorolac (TORADOL) 15 MG/ML injection 15 mg (15 mg Intravenous Given 04/22/20 2321)  fentaNYL (SUBLIMAZE) injection 100 mcg (100 mcg Intravenous Given 04/22/20 2350)   1:04 AM Patient's headache down to a 4 out of 10.  She states that she is ready to go home at  this time.    PROCEDURES  Procedures   ED DIAGNOSES     ICD-10-CM   1. Sporadic migraine  G43.409   2. Recurrent chest pain  R07.9        Arshan Jabs, Jenny Reichmann, MD 04/23/20 [phone]

## 2020-04-22 NOTE — ED Triage Notes (Addendum)
Pt c/o CP x 82min/1hour-denies fever/flu sx-pt states she is being followed by cards-just completed monitor with no result yet-pt also c/o migraine x 2 days which she states is the main reason she came to ED-no relief with rx med-NAD-steady gait

## 2020-04-23 ENCOUNTER — Other Ambulatory Visit: Payer: Self-pay | Admitting: Family Medicine

## 2020-04-23 MED ORDER — ATORVASTATIN CALCIUM 80 MG PO TABS: 80.0000 mg | ORAL_TABLET | Freq: Every day | ORAL | 3 refills | Status: DC

## 2020-04-23 NOTE — Telephone Encounter (Signed)
Advise on this refill, was last filled by another provider.

## 2020-04-24 NOTE — Progress Notes (Signed)
Normal MRI brain (without). No acute findings.

## 2020-04-28 ENCOUNTER — Encounter: Payer: Self-pay | Admitting: Neurology

## 2020-05-07 ENCOUNTER — Ambulatory Visit (INDEPENDENT_AMBULATORY_CARE_PROVIDER_SITE_OTHER): Payer: 59 | Admitting: Neurology

## 2020-05-07 DIAGNOSIS — E1169 Type 2 diabetes mellitus with other specified complication: Secondary | ICD-10-CM

## 2020-05-07 DIAGNOSIS — N183 Chronic kidney disease, stage 3 unspecified: Secondary | ICD-10-CM

## 2020-05-07 DIAGNOSIS — F411 Generalized anxiety disorder: Secondary | ICD-10-CM

## 2020-05-07 DIAGNOSIS — G4733 Obstructive sleep apnea (adult) (pediatric): Secondary | ICD-10-CM | POA: Diagnosis not present

## 2020-05-07 DIAGNOSIS — I482 Chronic atrial fibrillation, unspecified: Secondary | ICD-10-CM

## 2020-05-07 DIAGNOSIS — E669 Obesity, unspecified: Secondary | ICD-10-CM

## 2020-05-07 DIAGNOSIS — G43011 Migraine without aura, intractable, with status migrainosus: Secondary | ICD-10-CM

## 2020-05-07 DIAGNOSIS — R5383 Other fatigue: Secondary | ICD-10-CM

## 2020-05-14 ENCOUNTER — Other Ambulatory Visit: Payer: Self-pay | Admitting: Family Medicine

## 2020-05-14 DIAGNOSIS — E781 Pure hyperglyceridemia: Secondary | ICD-10-CM

## 2020-05-20 ENCOUNTER — Encounter: Payer: Self-pay | Admitting: Family Medicine

## 2020-05-20 ENCOUNTER — Other Ambulatory Visit: Payer: Self-pay

## 2020-05-20 ENCOUNTER — Ambulatory Visit (INDEPENDENT_AMBULATORY_CARE_PROVIDER_SITE_OTHER): Payer: 59 | Admitting: Family Medicine

## 2020-05-20 VITALS — BP 108/76 | HR 64 | Temp 98.6°F | Ht 66.0 in | Wt 250.2 lb

## 2020-05-20 DIAGNOSIS — E1165 Type 2 diabetes mellitus with hyperglycemia: Secondary | ICD-10-CM

## 2020-05-20 DIAGNOSIS — Z1231 Encounter for screening mammogram for malignant neoplasm of breast: Secondary | ICD-10-CM

## 2020-05-20 NOTE — Progress Notes (Signed)
Summary & Diagnosis:   There is very mild sleep apnea present - The AHI was 6.7/h and  treatment is considered optional in this range of apneas and  hypopneas. 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 This form of apnea is only responsive to CPAP treatment, should patient chose treatment at all.   I would recommend apnea treatment with PAP therapy as this may  affect hypersomnia , but not likely the (sleep related?)  headaches.   Recommendations:     I doubt that this mild apnea has an effect on the patient's  headaches, as no hypoxemia is associated.  The patient can start on CPAP 5-12 cm water 2 cm EPR and mask of  her choice, with heated humidity.  In her RV with NP will need to obtain new Epworth score again. [name]

## 2020-05-20 NOTE — Addendum Note (Signed)
Addended by: Larey Seat on: 05/20/2020 05:30 PM   Modules accepted: Orders

## 2020-05-20 NOTE — Progress Notes (Signed)
Subjective:   Chief Complaint  Patient presents with  . Follow-up    Paula Stout is a 64 y.o. female here for follow-up of diabetes.   Enda's self monitored glucose range is mid-high 100's.  Patient denies hypoglycemic reactions. Patient does not require insulin.   Medications include: Metformin 1000 mg bid Diet is poor.  Exercise: none  Past Medical History:  Diagnosis Date  . A-fib ([place])   . Anxiety   . Asthma   . Atrial fibrillation ([place]) 09/01/2014   Last Assessment & Plan:  Formatting of this note might be different from the original. A:  Chronic.  Sinus rhythm at this time.  States she was told this during admission at [facility] in the past. P:  On baby aspirin at home will continue.  Low dose metoprolol started.  . Bipolar 1 disorder, depressed, moderate ([place]) 02/18/2020  . Bipolar disorder ([place])   . Cellulitis 02/03/2019  . Chest pain 02/19/2020  . Chronic anticoagulation 02/20/2020  . Chronic atrial fibrillation ([place]) 02/18/2020  . Chronic headache 10/11/2013  . Chronic migraine without aura, intractable, without status migrainosus 06/24/2017  . Chronic pain of both shoulders 11/14/2015  . Colon cancer screening 04/15/2016   Last Assessment & Plan:  Formatting of this note might be different from the original. Will schedule for colonoscopy  . Depression   . Depression, recurrent ([place]) 02/18/2020  . Diabetes mellitus ([place])   . Diabetes mellitus without complication ([place])   . Dyslipidemia 02/20/2020  . GAD (generalized anxiety disorder) 02/18/2020  . Heartburn 04/15/2016   Last Assessment & Plan:  Formatting of this note might be different from the original. Patient was counseled regarding lifestyle modification  Advised to take Prilosec 30 mins before breakfast  Will schedule for EGD  . History of atrial fibrillation 06/24/2017  . History of posttraumatic stress disorder (PTSD) 10/07/2016  . HLD (hyperlipidemia) 09/01/2014   Last Assessment & Plan:  Formatting of this note  might be different from the original. A: Chronic. P: Continue statin.  [name] Hypertriglyceridemia 02/18/2020  . Impaired mobility and activities of daily living 11/14/2015  . Irritable bowel syndrome (IBS)   . Left elbow pain 11/14/2015  . Migraine   . Migraines 10/11/2013   Last Assessment & Plan:  Formatting of this note might be different from the original. A: Chronic. P: Continue Topamax. (also takes it for bipolar)  . Morbid obesity ([place]) 12/16/2016  . Obesity (BMI 30-39.9) 09/01/2014   Last Assessment & Plan:  Formatting of this note might be different from the original. A: Morbid obesity, with BMI of 35.0 - 39.9 P: Counseled to loose wt.  . Renal colic on left side 5/46/2703  . Sleep-disordered breathing 06/05/2018  . Unstable angina ([place]) 09/01/2014   Last Assessment & Plan:  Formatting of this note might be different from the original. A: Pt has 2 months exertional angina, recently developed angina at rest intermittently.  She had a positive nuclear medicine cardiolyte stress test with anteroapical ischemia on stress portion not seen at rest. P:  - continue aspirin, statin, metoprolol - Discussed stress test findings with cardiologist Dr. Landis Gandy     Related testing: Date of retinal exam: Done Pneumovax: done  Objective:  BP 108/76 (BP Location: Left Arm, Patient Position: Sitting, Cuff Size: Large)   Pulse 64   Temp 98.6 F (37 C) (Oral)   Ht 5\' 6"  (1.676 m)   Wt 250 lb 4 oz (113.5 kg)   SpO2 97%  BMI 40.39 kg/m  General:  Well developed, well nourished, in no apparent distress Skin:  Warm, no pallor or diaphoresis Head:  Normocephalic, atraumatic Eyes:  Pupils equal and round, sclera anicteric without injection  Lungs:  CTAB, no access msc use Cardio:  RRR, no bruits, no LE edema Musculoskeletal:  Symmetrical muscle groups noted without atrophy or deformity Neuro:  Sensation intact to pinprick on feet Psych: Age appropriate judgment and insight  Assessment:   Type 2 diabetes  mellitus with hyperglycemia, without long-term current use of insulin ([place]) - Plan: Microalbumin / creatinine urine ratio, Hemoglobin A1c  Screening mammogram, encounter for - Plan: MM DIGITAL SCREENING BILATERAL  Morbid obesity ([place])   Plan:   Counseled on diet and exercise.  I will send in [place] if her sugars are not at goal.  Payment card provided today. F/u in 3 mo for CPE. The patient voiced understanding and agreement to the plan.  [place], DO 05/20/20 12:18 PM

## 2020-05-20 NOTE — Patient Instructions (Addendum)
Aim to do some physical exertion for 150 minutes per week. This is typically divided into 5 days per week, 30 minutes per day. The activity should be enough to get your heart rate up. Anything is better than nothing if you have time constraints.  Keep the diet clean and stay active.  Give [place] 2-3 business days to get the results of your labs back.   Let me know if there are cost issues with the Ozempic.  I recommend getting the flu shot in mid October. This suggestion would change if the CDC comes out with a different recommendation.   Let us know if you need anything.

## 2020-05-20 NOTE — Procedures (Signed)
Sleep Study Report   Patient Information     First Name: Paula Last Name: Stout ID: [id number]  Birth Date: [birth year] Age: 64 Gender: Female  Referring Provider: [name], DO BMI: 39.0 (W=242 lb, H=5' 6'')  Sleep Study Information    Study Date: 05/07/20 S/H/A Version: [id number] / 4.1.1528 / 79  History:    Paula Stout is a 64 year old Caucasian female patient and seen here upon a [facility] on 04/02/2020 for a migraine evaluation.  She has been screened for sleep apnea and tested negative.  She indicated she is under a lot of stress.  Retired in [place] moved from [place] after her husband's and mother's death to be closer to her daughter. Paula Stout is a left-handed Caucasian female who has a past medical history of A-fib ([place]), Anxiety, Asthma, Bipolar disorder (HCC)/ Depression, Diabetes mellitus without complication ([place]), Irritable bowel syndrome (IBS), and Migraine.  Summary & Diagnosis:    There is very mild sleep apnea present - The AHI was 6.7/h and treatment is considered optional in this range of apneas and hypopneas. 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 this form of apnea is only responsive to CPAP treatment.      I would recommend apnea treatment with PAP therapy. this may affect hypersomnia but not as likely the  (sleep related?) headaches.   Recommendations:      I doubt that this mild apnea has an effect on the patient's headaches, as no hypoxemia is associated.  The patient can start on CPAP 5-12 cm water 2 cm EPR and mask of her choice, with heated humidity.  In her RV with NP will need to obtain new Epworth score again.        Interpreting Physician: Larey Seat, MD            Sleep Summary  Oxygen Saturation Statistics   Start Study Time: End Study Time: Total Recording Time:  9:22:05 PM 6:54:04 AM   9 h, 31 min  Total Sleep Time % REM of Sleep Time:  8 h, 23 min  19.7     Mean: 94 Minimum: 85 Maximum: 98  Mean of Desaturations [place]irs (%):   90  Oxygen Desaturation. %:   4-9 10-20 >20 Total  Events Number Total    25  1 96.2 3.8  0 0.0  26 100.0  Oxygen Saturation: <90 <=88 <85 <80 <70  Duration (minutes): Sleep % 3.3 0.6  1.0 0.0  0.2 0.0 0.0 0.0 0.0 0.0     Respiratory Indices      Total Events REM NREM All Night  pRDI: pAHI 3%: ODI 4%: pAHIc 3%: % CSR: pAHI 4%:  58  55  26  0 0.0 25 17.7 16.5 8.6 0.0 4.4 4.3 1.8 0.0 7.1 6.7 3.2 0.0 3.1       Pulse Rate Statistics during Sleep (BPM)      Mean: 67 Minimum: 43 Maximum: 90    Indices are calculated using technically valid sleep time of 8 h, 11 min.        5              15                    30            pAHI=6.7  Mild              Moderate                    Severe               Body Position Statistics  Position Supine Prone Right Left Non-Supine  Sleep (min) 211.5 186.0 18.5 88.0 292.5  Sleep % 42.0 36.9 3.7 17.5 58.0  pRDI 5.4 10.3 6.5 4.1 8.2  pAHI 3% 4.5 10.3 6.5 4.1 8.2  ODI 4% 1.8 5.8 3.2 0.7 4.1               Left   Prone  Right  Supine    Snoring Statistics Snoring Level (dB) >40 >50 >60 >70 >80 >Threshold (45)  Sleep (min) 382.2 101.2 31.1 0.3 0.0 173.5  Sleep % 75.8 20.1 6.2 0.1 0.0 34.4    Mean: 46 dB

## 2020-05-21 ENCOUNTER — Telehealth: Payer: Self-pay | Admitting: Neurology

## 2020-05-21 ENCOUNTER — Other Ambulatory Visit: Payer: Self-pay | Admitting: Family Medicine

## 2020-05-21 LAB — MICROALBUMIN / CREATININE URINE RATIO
Creatinine, Urine: 171 mg/dL (ref 20–275)
Microalb Creat Ratio: 7 mcg/mg creat (ref <30)
Microalb, Ur: 1.2 mg/dL

## 2020-05-21 LAB — HEMOGLOBIN A1C
Hgb A1c MFr Bld: 8.1 % of total Hgb — ABNORMAL HIGH (ref <5.7)
Mean Plasma Glucose: 186 (calc)
eAG (mmol/L): 10.3 (calc)

## 2020-05-21 MED ORDER — OZEMPIC (0.25 OR 0.5 MG/DOSE) 2 MG/1.5ML ~~LOC~~ SOPN: PEN_INJECTOR | SUBCUTANEOUS | 1 refills | Status: AC

## 2020-05-21 NOTE — Telephone Encounter (Signed)
Pt returned call and I reviewed the sleep study with her. Informed her of the mild apnea. Informed her that Dr Brett Fairy doesn't feel this is related to her headaches. At this time patient declines cpap. Advised to contact us if changes her mind. Pt verbalized understanding.

## 2020-05-21 NOTE — Telephone Encounter (Signed)
-----   Message from Larey Seat, MD sent at 05/20/2020  5:27 PM EDT ----- Summary & Diagnosis:   There is very mild sleep apnea present - The AHI was 6.7/h and  treatment is considered optional in this range of apneas and  hypopneas. 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 This form of apnea is only responsive to CPAP treatment, should patient chose treatment at all.   I would recommend apnea treatment with PAP therapy as this may  affect hypersomnia , but not likely the (sleep related?)  headaches.   Recommendations:     I doubt that this mild apnea has an effect on the patient's  headaches, as no hypoxemia is associated.  The patient can start on CPAP 5-12 cm water 2 cm EPR and mask of  her choice, with heated humidity.  In her RV with NP will need to obtain new Epworth score again. [name]

## 2020-05-27 ENCOUNTER — Inpatient Hospital Stay (HOSPITAL_BASED_OUTPATIENT_CLINIC_OR_DEPARTMENT_OTHER): Admission: RE | Admit: 2020-05-27 | Payer: 59 | Source: Ambulatory Visit

## 2020-05-28 ENCOUNTER — Ambulatory Visit (INDEPENDENT_AMBULATORY_CARE_PROVIDER_SITE_OTHER): Payer: 59 | Admitting: Cardiology

## 2020-05-28 ENCOUNTER — Encounter: Payer: Self-pay | Admitting: Cardiology

## 2020-05-28 ENCOUNTER — Other Ambulatory Visit: Payer: Self-pay

## 2020-05-28 VITALS — BP 109/76 | HR 70 | Ht 66.0 in | Wt 245.1 lb

## 2020-05-28 DIAGNOSIS — I480 Paroxysmal atrial fibrillation: Secondary | ICD-10-CM

## 2020-05-28 DIAGNOSIS — E782 Mixed hyperlipidemia: Secondary | ICD-10-CM | POA: Diagnosis not present

## 2020-05-28 DIAGNOSIS — E088 Diabetes mellitus due to underlying condition with unspecified complications: Secondary | ICD-10-CM

## 2020-05-28 NOTE — Progress Notes (Signed)
Cardiology Office Note:    Date:  05/28/2020   ID:  Paula [facility], DOB 09/19/1955, MRN [id number]  PCP:  [name], DO  Cardiologist:  Jenean Lindau, MD   Referring MD: [name]*    ASSESSMENT:    1. Paroxysmal atrial fibrillation (HCC)   2. Mixed hyperlipidemia   3. Diabetes mellitus due to underlying condition with unspecified complications ([facility])   4. Morbid obesity ([facility])    PLAN:    In order of problems listed above:  1. Paroxysmal atrial fibrillation post ablation:I discussed with the patient atrial fibrillation, disease process. Management and therapy including rate and rhythm control, anticoagulation benefits and potential risks were discussed extensively with the patient. Patient had multiple questions which were answered to patient's satisfaction.  Event monitor report was discussed with her at extensive length and reports from electrophysiology colleagues were discussed and she understands and questions were answered to her satisfaction 2. Essential hypertension: Blood pressure is stable 3. Mixed dyslipidemia diabetes mellitus: Diet was emphasized extensively.  Her recent hemoglobin A1c is elevated and I cautioned her against this.  Lifestyle modification was urged.  Importance of regular exercise was stressed and she promises to do better. 4. Patient will be seen in follow-up appointment in 6 months or earlier if the patient has any concerns    Medication Adjustments/Labs and Tests Ordered: Current medicines are reviewed at length with the patient today.  Concerns regarding medicines are outlined above.  No orders of the defined types were placed in this encounter.  No orders of the defined types were placed in this encounter.    No chief complaint on file.    History of Present Illness:    Paula Stout is a 64 y.o. female.  Patient has past medical history of paroxysmal atrial fibrillation, essential hypertension mixed  dyslipidemia and diabetes mellitus.  She denies any problems at this time and takes care of activities of daily living.  She leads a sedentary lifestyle.  She has history of depression.  She denies any suicidal tendencies.  No chest pain orthopnea or PND.  No palpitations.  At the time of my evaluation, the patient is alert awake oriented and in no distress.  Past Medical History:  Diagnosis Date  . A-fib ([facility])   . Anxiety   . Asthma   . Atrial fibrillation ([facility]) 09/01/2014   Last Assessment & Plan:  Formatting of this note might be different from the original. A:  Chronic.  Sinus rhythm at this time.  States she was told this during admission at [facility] in the past. P:  On baby aspirin at home will continue.  Low dose metoprolol started.  . Bipolar 1 disorder, depressed, moderate ([place]) 02/18/2020  . Bipolar disorder ([place])   . Cellulitis 02/03/2019  . Chest pain 02/19/2020  . Chronic anticoagulation 02/20/2020  . Chronic atrial fibrillation ([facility]) 02/18/2020  . Chronic headache 10/11/2013  . Chronic migraine without aura, intractable, without status migrainosus 06/24/2017  . Chronic pain of both shoulders 11/14/2015  . Colon cancer screening 04/15/2016   Last Assessment & Plan:  Formatting of this note might be different from the original. Will schedule for colonoscopy  . Depression   . Depression, recurrent ([facility]) 02/18/2020  . Diabetes mellitus (Pa[facility]out)   . Diabetes mellitus without complication ([facility])   . Dyslipidemia 02/20/2020  . GAD (generalized anxiety disorder) 02/18/2020  . Heartburn 04/15/2016   Last Assessment & Plan:  Formatting of this note might be different  from the original. Patient was counseled regarding lifestyle modification  Advised to take Prilosec 30 mins before breakfast  Will schedule for EGD  . History of atrial fibrillation 06/24/2017  . History of posttraumatic stress disorder (PTSD) 10/07/2016  . HLD (hyperlipidemia) 09/01/2014   Last Assessment & Plan:  Formatting of this  note might be different from the original. A: Chronic. P: Continue statin.  [name] Hypertriglyceridemia 02/18/2020  . Impaired mobility and activities of daily living 11/14/2015  . Irritable bowel syndrome (IBS)   . Left elbow pain 11/14/2015  . Migraine   . Migraines 10/11/2013   Last Assessment & Plan:  Formatting of this note might be different from the original. A: Chronic. P: Continue Topamax. (also takes it for bipolar)  . Morbid obesity ([facility]) 12/16/2016  . Obesity (BMI 30-39.9) 09/01/2014   Last Assessment & Plan:  Formatting of this note might be different from the original. A: Morbid obesity, with BMI of 35.0 - 39.9 P: Counseled to loose wt.  . Renal colic on left side 5/45/6256  . Sleep-disordered breathing 06/05/2018  . Unstable angina ([facility]out) 09/01/2014   Last Assessment & Plan:  Formatting of this note might be different from the original. A: Pt has 2 months exertional angina, recently developed angina at rest intermittently.  She had a positive nuclear medicine cardiolyte stress test with anteroapical ischemia on stress portion not seen at rest. P:  - continue aspirin, statin, metoprolol - Discussed stress test findings with cardiologist Dr. Landis Gandy    Past Surgical History:  Procedure Laterality Date  . ABDOMINAL HYSTERECTOMY     Fibroids  . ATRIAL FIBRILLATION ABLATION    . CARDIAC CATHETERIZATION    . CESAREAN SECTION    . KNEE SURGERY    . LEFT HEART CATH AND CORONARY ANGIOGRAPHY N/A 03/04/2020   Procedure: LEFT HEART CATH AND CORONARY ANGIOGRAPHY;  Surgeon: Belva Crome, MD;  Location: [facility];  Service: Cardiovascular;  Laterality: N/A;  . ROTATOR CUFF REPAIR      Current Medications: Current Meds  Medication Sig  . albuterol (VENTOLIN HFA) 108 (90 Base) MCG/ACT inhaler Inhale 2 puffs into the lungs every 4 (four) hours as needed for wheezing.   [name] apixaban (ELIQUIS) 5 MG TABS tablet Take 1 tablet (5 mg total) by mouth in the morning and at bedtime.  [name] atorvastatin  (LIPITOR) 80 MG tablet Take 1 tablet (80 mg total) by mouth daily.  [name] buPROPion (WELLBUTRIN XL) 300 MG 24 hr tablet Take 1 tablet (300 mg total) by mouth daily.  . butalbital-acetaminophen-caffeine (FIORICET) 50-325-40 MG tablet Take 1 tablet by mouth every 6 (six) hours as needed.  . diltiazem (CARTIA XT) 120 MG 24 hr capsule Take 1 capsule (120 mg total) by mouth daily.  . fenofibrate 160 MG tablet Take 1 tablet (160 mg total) by mouth daily.  . Galcanezumab-gnlm (EMGALITY) 120 MG/ML SOSY Every 30 days subcutaneously.  [name] glucose blood (FREESTYLE TEST STRIPS) test strip 1 each by Other route daily.  [name] glucose monitoring kit (FREESTYLE) monitoring kit Use daily to check blood sugar.  DX E11.9  . Lancets (FREESTYLE) lancets 1 each by Other route daily.  . Lurasidone HCl 120 MG TABS Take 1 tablet (120 mg total) by mouth at bedtime.  . metFORMIN (GLUCOPHAGE) 1000 MG tablet Take 1,000 mg by mouth 2 (two) times daily with a meal.  . metoprolol succinate (TOPROL-XL) 50 MG 24 hr tablet Take 1 tablet (50 mg total) by mouth daily. Take  with or immediately following a meal.  . nitroGLYCERIN (NITROSTAT) 0.4 MG SL tablet Place 1 tablet (0.4 mg total) under the tongue as needed.  . ondansetron (ZOFRAN-ODT) 4 MG disintegrating tablet Take 1 tablet (4 mg total) by mouth every 8 (eight) hours as needed for nausea.  . pantoprazole (PROTONIX) 40 MG tablet Take 1 tablet (40 mg total) by mouth daily.  . QUEtiapine (SEROQUEL) 300 MG tablet Take 1 tablet (300 mg total) by mouth at bedtime.  . rizatriptan (MAXALT) 10 MG tablet Take 1 tablet (10 mg total) by mouth as needed for migraine. May repeat in 2 hours if needed  . Semaglutide,0.25 or 0.5MG/DOS, (OZEMPIC, 0.25 OR 0.5 MG/DOSE,) 2 MG/1.5ML SOPN Inject 0.25 mg into the skin once a week for 28 days, THEN 0.5 mg once a week for 28 days.  [name] topiramate (TOPAMAX) 50 MG tablet Take 3 tablets (150 mg total) by mouth in the morning and at bedtime.  [name] zolpidem (AMBIEN CR)  6.25 MG CR tablet Take 1 tablet (6.25 mg total) by mouth at bedtime as needed for sleep.     Allergies:   Bee venom, Onion, Sumatriptan, Asa [aspirin], Nsaids, Iodine, Sulfa antibiotics, and Vancomycin   Social History   Socioeconomic History  . Marital status: Widowed    Spouse name: Not on file  . Number of children: 6  . Years of education: Not on file  . Highest education level: Not on file  Occupational History  . Not on file  Tobacco Use  . Smoking status: Never Smoker  . Smokeless tobacco: Never Used  Vaping Use  . Vaping Use: Never used  Substance and Sexual Activity  . Alcohol use: Never  . Drug use: Never  . Sexual activity: Not on file  Other Topics Concern  . Not on file  Social History Narrative  . Not on file   Social Determinants of Health   Financial Resource Strain:   . Difficulty of Paying Living Expenses: Not on file  Food Insecurity:   . Worried About Charity fundraiser in the Last Year: Not on file  . Ran Out of Food in the Last Year: Not on file  Transportation Needs:   . Lack of Transportation (Medical): Not on file  . Lack of Transportation (Non-Medical): Not on file  Physical Activity:   . Days of Exercise per Week: Not on file  . Minutes of Exercise per Session: Not on file  Stress:   . Feeling of Stress : Not on file  Social Connections:   . Frequency of Communication with Friends and Family: Not on file  . Frequency of Social Gatherings with Friends and Family: Not on file  . Attends Religious Services: Not on file  . Active Member of Clubs or Organizations: Not on file  . Attends Archivist Meetings: Not on file  . Marital Status: Not on file     Family History: The patient's family history includes Bipolar disorder in her daughter and son. She was adopted.  ROS:   Please see the history of present illness.    All other systems reviewed and are negative.  EKGs/Labs/Other Studies Reviewed:    The following studies  were reviewed today: I reviewed records and report by our electrophysiology colleague.   Recent Labs: 04/22/2020: BUN 22; Creatinine, Ser 1.61; Hemoglobin 12.5; Platelets 348; Potassium 3.9; Sodium 140  Recent Lipid Panel    Component Value Date/Time   CHOL 196 02/20/2020 0754   TRIG  199 (H) 02/20/2020 0754   HDL 39 (L) 02/20/2020 0754   CHOLHDL 5.0 02/20/2020 0754   VLDL 40 02/20/2020 0754   LDLCALC NOT CALCULATED 02/20/2020 0754    Physical Exam:    VS:  BP 109/76   Pulse 70   Ht '5\' 6"'  (1.676 m)   Wt 245 lb 1.3 oz (111.2 kg)   SpO2 97%   BMI 39.56 kg/m     Wt Readings from Last 3 Encounters:  05/28/20 245 lb 1.3 oz (111.2 kg)  05/20/20 250 lb 4 oz (113.5 kg)  04/22/20 243 lb (110.2 kg)     GEN: Patient is in no acute distress HEENT: Normal NECK: No JVD; No carotid bruits LYMPHATICS: No lymphadenopathy CARDIAC: Hear sounds regular, 2/6 systolic murmur at the apex. RESPIRATORY:  Clear to auscultation without rales, wheezing or rhonchi  ABDOMEN: Soft, non-tender, non-distended MUSCULOSKELETAL:  No edema; No deformity  SKIN: Warm and dry NEUROLOGIC:  Alert and oriented x 3 PSYCHIATRIC:  Normal affect   Signed, Jenean Lindau, MD  05/28/2020 11:50 AM    [place]

## 2020-05-28 NOTE — Patient Instructions (Signed)
Medication Instructions:  No medication changes. *If you need a refill on your cardiac medications before your next appointment, please call your pharmacy*   Lab Work: None ordered If you have labs (blood work) drawn today and your tests are completely normal, you will receive your results only by: . MyChart Message (if you have MyChart) OR . A paper copy in the mail If you have any lab test that is abnormal or we need to change your treatment, we will call you to review the results.   Testing/Procedures: None ordered   Follow-Up: At [facility], you and your health needs are our priority.  As part of our continuing mission to provide you with exceptional heart care, we have created designated Provider Care Teams.  These Care Teams include your primary Cardiologist (physician) and Advanced Practice Providers (APPs -  Physician Assistants and Nurse Practitioners) who all work together to provide you with the care you need, when you need it.  We recommend signing up for the patient portal called "MyChart".  Sign up information is provided on this After Visit Summary.  MyChart is used to connect with patients for Virtual Visits (Telemedicine).  Patients are able to view lab/test results, encounter notes, upcoming appointments, etc.  Non-urgent messages can be sent to your provider as well.   To learn more about what you can do with MyChart, go to [email or web address].    Your next appointment:   4 month(s)  The format for your next appointment:   In Person  Provider:   Rajan Revankar, MD   Other Instructions NA  

## 2020-06-02 ENCOUNTER — Encounter: Payer: Self-pay | Admitting: Cardiology

## 2020-06-02 ENCOUNTER — Other Ambulatory Visit: Payer: Self-pay

## 2020-06-02 ENCOUNTER — Ambulatory Visit (INDEPENDENT_AMBULATORY_CARE_PROVIDER_SITE_OTHER): Payer: 59 | Admitting: Cardiology

## 2020-06-02 VITALS — BP 110/62 | HR 76 | Ht 66.0 in | Wt 244.0 lb

## 2020-06-02 DIAGNOSIS — I471 Supraventricular tachycardia: Secondary | ICD-10-CM

## 2020-06-02 MED ORDER — DILTIAZEM HCL ER COATED BEADS 240 MG PO CP24: 240.0000 mg | ORAL_CAPSULE | Freq: Every day | ORAL | 3 refills | Status: DC

## 2020-06-02 NOTE — Patient Instructions (Signed)
Medication Instructions:  Your physician has recommended you make the following change in your medication:  1. INCREASE Diltiazem to 240 mg once daily  *If you need a refill on your cardiac medications before your next appointment, please call your pharmacy*   Lab Work: None ordered   Testing/Procedures: None ordered   Follow-Up: At [facility], you and your health needs are our priority.  As part of our continuing mission to provide you with exceptional heart care, we have created designated Provider Care Teams.  These Care Teams include your primary Cardiologist (physician) and Advanced Practice Providers (APPs -  Physician Assistants and Nurse Practitioners) who all work together to provide you with the care you need, when you need it.  We recommend signing up for the patient portal called "MyChart".  Sign up information is provided on this After Visit Summary.  MyChart is used to connect with patients for Virtual Visits (Telemedicine).  Patients are able to view lab/test results, encounter notes, upcoming appointments, etc.  Non-urgent messages can be sent to your provider as well.   To learn more about what you can do with MyChart, go to [email or web address].    Your next appointment:   3 month(s)  The format for your next appointment:   In Person  Provider:   Allegra Lai, MD    Thank you for choosing [facility]!!   Trinidad Curet, RN [phone]   Other Instructions

## 2020-06-02 NOTE — Progress Notes (Signed)
Electrophysiology Office Note   Date:  06/02/2020   ID:  Paula Stout, DOB September 11, 1955, MRN [id number]  PCP:  Paula Pal, DO  Cardiologist: Paula Stout Primary Electrophysiologist:  Paula Kiester [name], MD    Chief Complaint: Atrial fibrillation   History of Present Illness: Paula Stout is a 64 y.o. female who is being seen today for the evaluation of atrial fibrillation at the request of Paula Stout*. Presenting today for electrophysiology evaluation.  She has a history significant for paroxysmal atrial fibrillation, type 2 diabetes, hyperlipidemia, and stage III CKD.  She recently moved from [place].  She is previously had an ablation.  She was supposed to have a second ablation but this was postponed due to Covid.  She does have on and off palpitations.  She presented [facility] June 2021 with palpitations and chest pain.  She was found to be in sinus rhythm on presentation.  She wore an apple watch, tracings were not conclusive.  She had a left heart catheterization which showed no evidence of coronary artery disease.  She has since worn a cardiac monitor, with triggered events showing sinus rhythm.  Today, denies symptoms of palpitations, chest pain, shortness of breath, orthopnea, PND, lower extremity edema, claudication, dizziness, presyncope, syncope, bleeding, or neurologic sequela. The patient is tolerating medications without difficulties. Since last being seen she wore a cardiac monitor.  Her monitor showed no evidence of SVT or atrial fibrillation.  She did have symptoms of palpitations.  She brings in recordings from her apple watch that show multiple episodes of SVT.  Her symptoms occur without exacerbating or alleviating factors.  She says that they last up to 10 minutes at a time and are associated with chest pain.   Past Medical History:  Diagnosis Date  . A-fib ([place])   . Anxiety   . Asthma   . Atrial fibrillation ([place]) 09/01/2014    Last Assessment & Plan:  Formatting of this note might be different from the original. A:  Chronic.  Sinus rhythm at this time.  States she was told this during admission at [facility] in the past. P:  On baby aspirin at home Paula Stout continue.  Low dose metoprolol started.  . Bipolar 1 disorder, depressed, moderate ([place]) 02/18/2020  . Bipolar disorder ([place])   . Cellulitis 02/03/2019  . Chest pain 02/19/2020  . Chronic anticoagulation 02/20/2020  . Chronic atrial fibrillation ([place]) 02/18/2020  . Chronic headache 10/11/2013  . Chronic migraine without aura, intractable, without status migrainosus 06/24/2017  . Chronic pain of both shoulders 11/14/2015  . Colon cancer screening 04/15/2016   Last Assessment & Plan:  Formatting of this note might be different from the original. Paula Stout schedule for colonoscopy  . Depression   . Depression, recurrent ([place]) 02/18/2020  . Diabetes mellitus ([place])   . Diabetes mellitus without complication ([place])   . Dyslipidemia 02/20/2020  . GAD (generalized anxiety disorder) 02/18/2020  . Heartburn 04/15/2016   Last Assessment & Plan:  Formatting of this note might be different from the original. Patient was counseled regarding lifestyle modification  Advised to take Prilosec 30 mins before breakfast  Paula Stout schedule for EGD  . History of atrial fibrillation 06/24/2017  . History of posttraumatic stress disorder (PTSD) 10/07/2016  . HLD (hyperlipidemia) 09/01/2014   Last Assessment & Plan:  Formatting of this note might be different from the original. A: Chronic. P: Continue statin.  [name] Hypertriglyceridemia 02/18/2020  . Impaired mobility and activities of daily living 11/14/2015  .  Irritable bowel syndrome (IBS)   . Left elbow pain 11/14/2015  . Migraine   . Migraines 10/11/2013   Last Assessment & Plan:  Formatting of this note might be different from the original. A: Chronic. P: Continue Topamax. (also takes it for bipolar)  . Morbid obesity ([place]) 12/16/2016  . Obesity (BMI 30-39.9)  09/01/2014   Last Assessment & Plan:  Formatting of this note might be different from the original. A: Morbid obesity, with BMI of 35.0 - 39.9 P: Counseled to loose wt.  . Renal colic on left side 2/59/5638  . Sleep-disordered breathing 06/05/2018  . Unstable angina ([place]) 09/01/2014   Last Assessment & Plan:  Formatting of this note might be different from the original. A: Pt has 2 months exertional angina, recently developed angina at rest intermittently.  She had a positive nuclear medicine cardiolyte stress test with anteroapical ischemia on stress portion not seen at rest. P:  - continue aspirin, statin, metoprolol - Discussed stress test findings with cardiologist Paula Stout   Past Surgical History:  Procedure Laterality Date  . ABDOMINAL HYSTERECTOMY     Fibroids  . ATRIAL FIBRILLATION ABLATION    . CARDIAC CATHETERIZATION    . CESAREAN SECTION    . KNEE SURGERY    . LEFT HEART CATH AND CORONARY ANGIOGRAPHY N/A 03/04/2020   Procedure: LEFT HEART CATH AND CORONARY ANGIOGRAPHY;  Surgeon: Paula Crome, MD;  Location: [place] [facility];  Service: Cardiovascular;  Laterality: N/A;  . ROTATOR CUFF REPAIR       Current Outpatient Medications  Medication Sig Dispense Refill  . albuterol (VENTOLIN HFA) 108 (90 Base) MCG/ACT inhaler Inhale 2 puffs into the lungs every 4 (four) hours as needed for wheezing.     [name] apixaban (ELIQUIS) 5 MG TABS tablet Take 1 tablet (5 mg total) by mouth in the morning and at bedtime. 180 tablet 2  . atorvastatin (LIPITOR) 80 MG tablet Take 1 tablet (80 mg total) by mouth daily. 90 tablet 3  . buPROPion (WELLBUTRIN XL) 300 MG 24 hr tablet Take 1 tablet (300 mg total) by mouth daily. 90 tablet 2  . butalbital-acetaminophen-caffeine (FIORICET) 50-325-40 MG tablet Take 1 tablet by mouth every 6 (six) hours as needed.    . fenofibrate 160 MG tablet Take 1 tablet (160 mg total) by mouth daily. 90 tablet 1  . Galcanezumab-gnlm (EMGALITY) 120 MG/ML SOSY Every 30 days  subcutaneously. 1.12 mL 5  . glucose blood (FREESTYLE TEST STRIPS) test strip 1 each by Other route daily. 100 each 3  . glucose monitoring kit (FREESTYLE) monitoring kit Use daily to check blood sugar.  DX E11.9 1 each 0  . Lancets (FREESTYLE) lancets 1 each by Other route daily. 100 each 3  . Lurasidone HCl 120 MG TABS Take 1 tablet (120 mg total) by mouth at bedtime. 28 tablet 0  . metFORMIN (GLUCOPHAGE) 1000 MG tablet Take 1,000 mg by mouth 2 (two) times daily with a meal.    . metoprolol succinate (TOPROL-XL) 50 MG 24 hr tablet Take 1 tablet (50 mg total) by mouth daily. Take with or immediately following a meal. 90 tablet 3  . nitroGLYCERIN (NITROSTAT) 0.4 MG SL tablet Place 1 tablet (0.4 mg total) under the tongue as needed. 25 tablet 2  . ondansetron (ZOFRAN-ODT) 4 MG disintegrating tablet Take 1 tablet (4 mg total) by mouth every 8 (eight) hours as needed for nausea. 20 tablet 2  . pantoprazole (PROTONIX) 40 MG tablet Take 1  tablet (40 mg total) by mouth daily. 90 tablet 2  . QUEtiapine (SEROQUEL) 300 MG tablet Take 1 tablet (300 mg total) by mouth at bedtime. 90 tablet 2  . rizatriptan (MAXALT) 10 MG tablet Take 1 tablet (10 mg total) by mouth as needed for migraine. May repeat in 2 hours if needed 10 tablet 0  . Semaglutide,0.25 or 0.5MG/DOS, (OZEMPIC, 0.25 OR 0.5 MG/DOSE,) 2 MG/1.5ML SOPN Inject 0.25 mg into the skin once a week for 28 days, THEN 0.5 mg once a week for 28 days. 4.5 mL 1  . topiramate (TOPAMAX) 50 MG tablet Take 3 tablets (150 mg total) by mouth in the morning and at bedtime. 540 tablet 2  . zolpidem (AMBIEN CR) 6.25 MG CR tablet Take 1 tablet (6.25 mg total) by mouth at bedtime as needed for sleep. 30 tablet 2   No current facility-administered medications for this visit.    Allergies:   Bee venom, Onion, Sumatriptan, Asa [aspirin], Nsaids, Iodine, Sulfa antibiotics, and Vancomycin   Social History:  The patient  reports that she has never smoked. She has never used  smokeless tobacco. She reports that she does not drink alcohol and does not use drugs.   Family History:  The patient's family history includes Bipolar disorder in her daughter and son. She was adopted.   ROS:  Please see the history of present illness.   Otherwise, review of systems is positive for none.   All other systems are reviewed and negative.   PHYSICAL EXAM: VS:  BP 110/62   Pulse 76   Ht _0  (1.676 m)   Wt 244 lb (110.7 kg)   SpO2 97%   BMI 39.38 kg/m  , BMI Body mass index is 39.38 kg/m. GEN: Well nourished, well developed, in no acute distress  HEENT: normal  Neck: no JVD, carotid bruits, or masses Cardiac: RRR; no murmurs, rubs, or gallops,no edema  Respiratory:  clear to auscultation bilaterally, normal work of breathing GI: soft, nontender, nondistended, + BS MS: no deformity or atrophy  Skin: warm and dry Neuro:  Strength and sensation are intact Psych: euthymic mood, full affect  EKG:  EKG is ordered today. Personal review of the ekg ordered shows sinus rhythm, rate 76  Recent Labs: 04/22/2020: BUN 22; Creatinine, Ser 1.61; Hemoglobin 12.5; Platelets 348; Potassium 3.9; Sodium 140    Lipid Panel     Component Value Date/Time   CHOL 196 02/20/2020 0754   TRIG 199 (H) 02/20/2020 0754   HDL 39 (L) 02/20/2020 0754   CHOLHDL 5.0 02/20/2020 0754   VLDL 40 02/20/2020 0754   LDLCALC NOT CALCULATED 02/20/2020 0754     Wt Readings from Last 3 Encounters:  06/02/20 244 lb (110.7 kg)  05/28/20 245 lb 1.3 oz (111.2 kg)  05/20/20 250 lb 4 oz (113.5 kg)      Other studies Reviewed: Additional studies/ records that were reviewed today include: TTE 02/20/20  Review of the above records today demonstrates:  1. Left ventricular ejection fraction, by estimation, is 60 to 65%. The  left ventricle has normal function. The left ventricle has no regional  wall motion abnormalities. Left ventricular diastolic parameters are  consistent with Grade I diastolic   dysfunction (impaired relaxation). Elevated left ventricular end-diastolic  pressure.  2. Right ventricular systolic function is normal. The right ventricular  size is normal. There is normal pulmonary artery systolic pressure. The  estimated right ventricular systolic pressure is 06.2 mmHg.  3. The mitral valve  is normal in structure. No evidence of mitral valve  regurgitation. No evidence of mitral stenosis.  4. The aortic valve is normal in structure. Aortic valve regurgitation is  not visualized. No aortic stenosis is present.  5. Aortic dilatation noted. 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  6. The inferior vena cava is normal in size with greater than 50%  respiratory variability, suggesting right atrial pressure of 3 mmHg.   LHC 03/04/20  Left dominant coronary anatomy  Normal left main  Normal LAD that wraps around the left ventricular apex and supplies one half of the posterior interventricular groove territory.  Large first diagonal/ramus intermedius that covers the distribution typically supplied by diagonal branches.  Nondominant right coronary  Dominant circumflex coronary artery.  The circumflex coronary is tortuous including the first obtuse marginal branch.  Normal left ventricular end-diastolic pressure.  Ventriculography not performed to limit contrast exposure.  Cardiac monitor 05/07/2020 personally reviewed Max 146 bpm 03:36am, 08/16 Min 52 bpm 05:33am, 08/08 Avg 68 bpm 1.7% PACs, <1% PVCs Predominant rhythm was sinus rhythm 3 short SVT episodes, longest 5 beats All triggered events/symptoms associated with sinus rhythm   ASSESSMENT AND PLAN:  1.  Paroxysmal atrial fibrillation: Currently on Eliquis, diltiazem, metoprolol.  Prior AF ablation 09/27/2018 with PVI and CTI in [place].  She is continued to have episodes of what appears to be SVT but no atrial fibrillation.  2.  Hyperlipidemia: Continue  atorvastatin  3.  SVT: Could also be atrial flutter.  She brings in recordings from her apple watch that show rapid rates in the 120s.  There is no obvious atrial activity.  She says that she has quite a few symptoms of palpitations and chest pain when this occurs.  We Eliana Lueth increase her diltiazem to 240 mg.  Current medicines are reviewed at length with the patient today.   The patient does not have concerns regarding her medicines.  The following changes were made today: Increase diltiazem  Labs/ tests ordered today include:  No orders of the defined types were placed in this encounter.    Disposition:   FU with Lilyanne Mcquown 3 months  Signed, Aylin Rhoads [name], MD  06/02/2020 4:29 PM     [facility] [address] [place] [address] [phone] (office) [phone] (fax)

## 2020-06-23 MED ORDER — ALBUTEROL SULFATE HFA 108 (90 BASE) MCG/ACT IN AERS: 2.0000 | INHALATION_SPRAY | RESPIRATORY_TRACT | 2 refills | Status: DC | PRN

## 2020-07-01 ENCOUNTER — Other Ambulatory Visit: Payer: Self-pay

## 2020-07-01 ENCOUNTER — Telehealth (INDEPENDENT_AMBULATORY_CARE_PROVIDER_SITE_OTHER): Payer: 59 | Admitting: Psychiatry

## 2020-07-01 ENCOUNTER — Other Ambulatory Visit: Payer: Self-pay | Admitting: Family Medicine

## 2020-07-01 DIAGNOSIS — F319 Bipolar disorder, unspecified: Secondary | ICD-10-CM | POA: Diagnosis not present

## 2020-07-01 DIAGNOSIS — F411 Generalized anxiety disorder: Secondary | ICD-10-CM | POA: Diagnosis not present

## 2020-07-01 MED ORDER — ZOLPIDEM TARTRATE ER 6.25 MG PO TBCR: 6.2500 mg | EXTENDED_RELEASE_TABLET | Freq: Every evening | ORAL | 2 refills | Status: DC | PRN

## 2020-07-01 MED ORDER — LURASIDONE HCL 120 MG PO TABS: 120.0000 mg | ORAL_TABLET | Freq: Every evening | ORAL | 5 refills | Status: DC

## 2020-07-01 NOTE — Progress Notes (Signed)
BH MD/PA/NP OP Progress Note  07/01/2020 2:41 PM Paula Stout  MRN:  [id number] Interview was conducted using videoconferencing application and I verified that I was speaking with the correct person using two identifiers. I discussed the limitations of evaluation and management by telemedicine and  the availability of in person appointments. Patient expressed understanding and agreed to proceed. Patient location - home; physician - home office.  Chief Complaint: Some anxiety.  HPI: 64 yo widowed female with a long history of bipolar disorder amd generalized anxiety disorder. First depressive episodes occurred in middle school, mania at age 57. She used to have few manic episodes a year (last one 7 months ago) but these have became less frequent once she started on a higher dose of lurasidone. She has been followed by Dr. Lilia Stout a psychiatrist in [facility] who had her on Latuda 120 mg, Seroquel 300 mg (initiated for insomnia), Wellbutrin XL 300 mg daily. She lost her husband last year and on November 1st moved to [place] to stay with her oldest daughter. Her insurance changed and approval for Paula Stout was denied. She still has 5 tablets left. Prior to [place] she tried unsucessfully  Lamictal, Abilify, Geodon (these are ones she remembers). Her manic episodes were described as racing thoughts, increased energy, decreased need for sleep, impulsive overspending, euphoria. Depressive episodes have been more frequent recently. She continued to struggle with insomnia both initial and middle so we have tried zolpidem CR - it proved to be quite helpful. While in [place] she was in counseling on monthly basis and she would like to have a therapist here as well. Paula Stout will spend Thanksgiving in [place] state and Christmas in [place] visiting her children/grandchildren.   Visit Diagnosis:    ICD-10-CM   1. Bipolar 1 disorder (HCC)  F31.9   2. GAD (generalized anxiety disorder)  F41.1     Past Psychiatric  History: Please see intake H&P.  Past Medical History:  Past Medical History:  Diagnosis Date  . A-fib ([place])   . Anxiety   . Asthma   . Atrial fibrillation ([place]) 09/01/2014   Last Assessment & Plan:  Formatting of this note might be different from the original. A:  Chronic.  Sinus rhythm at this time.  States she was told this during admission at [facility] in the past. P:  On baby aspirin at home will continue.  Low dose metoprolol started.  . Bipolar 1 disorder, depressed, moderate ([place]) 02/18/2020  . Bipolar disorder ([place])   . Cellulitis 02/03/2019  . Chest pain 02/19/2020  . Chronic anticoagulation 02/20/2020  . Chronic atrial fibrillation ([place]) 02/18/2020  . Chronic headache 10/11/2013  . Chronic migraine without aura, intractable, without status migrainosus 06/24/2017  . Chronic pain of both shoulders 11/14/2015  . Colon cancer screening 04/15/2016   Last Assessment & Plan:  Formatting of this note might be different from the original. Will schedule for colonoscopy  . Depression   . Depression, recurrent ([place]) 02/18/2020  . Diabetes mellitus ([place])   . Diabetes mellitus without complication ([place])   . Dyslipidemia 02/20/2020  . GAD (generalized anxiety disorder) 02/18/2020  . Heartburn 04/15/2016   Last Assessment & Plan:  Formatting of this note might be different from the original. Patient was counseled regarding lifestyle modification  Advised to take Prilosec 30 mins before breakfast  Will schedule for EGD  . History of atrial fibrillation 06/24/2017  . History of posttraumatic stress disorder (PTSD) 10/07/2016  . HLD (hyperlipidemia) 09/01/2014   Last  Assessment & Plan:  Formatting of this note might be different from the original. A: Chronic. P: Continue statin.  [name] Hypertriglyceridemia 02/18/2020  . Impaired mobility and activities of daily living 11/14/2015  . Irritable bowel syndrome (IBS)   . Left elbow pain 11/14/2015  . Migraine   . Migraines 10/11/2013   Last Assessment & Plan:   Formatting of this note might be different from the original. A: Chronic. P: Continue Topamax. (also takes it for bipolar)  . Morbid obesity ([place]) 12/16/2016  . Obesity (BMI 30-39.9) 09/01/2014   Last Assessment & Plan:  Formatting of this note might be different from the original. A: Morbid obesity, with BMI of 35.0 - 39.9 P: Counseled to loose wt.  . Renal colic on left side 5/88/5027  . Sleep-disordered breathing 06/05/2018  . Unstable angina ([place]) 09/01/2014   Last Assessment & Plan:  Formatting of this note might be different from the original. A: Pt has 2 months exertional angina, recently developed angina at rest intermittently.  She had a positive nuclear medicine cardiolyte stress test with anteroapical ischemia on stress portion not seen at rest. P:  - continue aspirin, statin, metoprolol - Discussed stress test findings with cardiologist Dr. Landis Gandy    Past Surgical History:  Procedure Laterality Date  . ABDOMINAL HYSTERECTOMY     Fibroids  . ATRIAL FIBRILLATION ABLATION    . CARDIAC CATHETERIZATION    . CESAREAN SECTION    . KNEE SURGERY    . LEFT HEART CATH AND CORONARY ANGIOGRAPHY N/A 03/04/2020   Procedure: LEFT HEART CATH AND CORONARY ANGIOGRAPHY;  Surgeon: Belva Crome, MD;  Location: [facility];  Service: Cardiovascular;  Laterality: N/A;  . ROTATOR CUFF REPAIR      Family Psychiatric History: Reviewed.  Family History:  Family History  Adopted: Yes  Problem Relation Age of Onset  . Bipolar disorder Son   . Bipolar disorder Daughter     Social History:  Social History   Socioeconomic History  . Marital status: Widowed    Spouse name: Not on file  . Number of children: 6  . Years of education: Not on file  . Highest education level: Not on file  Occupational History  . Not on file  Tobacco Use  . Smoking status: Never Smoker  . Smokeless tobacco: Never Used  Vaping Use  . Vaping Use: Never used  Substance and Sexual Activity  . Alcohol use: Never   . Drug use: Never  . Sexual activity: Not on file  Other Topics Concern  . Not on file  Social History Narrative  . Not on file   Social Determinants of Health   Financial Resource Strain:   . Difficulty of Paying Living Expenses: Not on file  Food Insecurity:   . Worried About Charity fundraiser in the Last Year: Not on file  . Ran Out of Food in the Last Year: Not on file  Transportation Needs:   . Lack of Transportation (Medical): Not on file  . Lack of Transportation (Non-Medical): Not on file  Physical Activity:   . Days of Exercise per Week: Not on file  . Minutes of Exercise per Session: Not on file  Stress:   . Feeling of Stress : Not on file  Social Connections:   . Frequency of Communication with Friends and Family: Not on file  . Frequency of Social Gatherings with Friends and Family: Not on file  . Attends Religious Services: Not on  file  . Active Member of Clubs or Organizations: Not on file  . Attends Archivist Meetings: Not on file  . Marital Status: Not on file    Allergies:  Allergies  Allergen Reactions  . Bee Venom Anaphylaxis  . Onion     Other reaction(s): Respiratory Distress  . Sumatriptan Other (See Comments)    Other reaction(s): UNKNOWN Passed out, nose bleed   . Asa [Aspirin] Nausea And Vomiting  . Nsaids Nausea And Vomiting  . Iodine Rash  . Sulfa Antibiotics Rash  . Vancomycin Rash    Metabolic Disorder Labs: Lab Results  Component Value Date   HGBA1C 8.1 (H) 05/20/2020   MPG 186 05/20/2020   MPG 163 02/20/2020   No results found for: PROLACTIN Lab Results  Component Value Date   CHOL 196 02/20/2020   TRIG 199 (H) 02/20/2020   HDL 39 (L) 02/20/2020   CHOLHDL 5.0 02/20/2020   VLDL 40 02/20/2020   LDLCALC NOT CALCULATED 02/20/2020   No results found for: TSH  Therapeutic Level Labs: No results found for: LITHIUM No results found for: VALPROATE No components found for:  CBMZ  Current Medications: Current  Outpatient Medications  Medication Sig Dispense Refill  . albuterol (VENTOLIN HFA) 108 (90 Base) MCG/ACT inhaler Inhale 2 puffs into the lungs every 4 (four) hours as needed for wheezing. 1 each 2  . apixaban (ELIQUIS) 5 MG TABS tablet Take 1 tablet (5 mg total) by mouth in the morning and at bedtime. 180 tablet 2  . atorvastatin (LIPITOR) 80 MG tablet Take 1 tablet (80 mg total) by mouth daily. 90 tablet 3  . buPROPion (WELLBUTRIN XL) 300 MG 24 hr tablet Take 1 tablet (300 mg total) by mouth daily. 90 tablet 2  . butalbital-acetaminophen-caffeine (FIORICET) 50-325-40 MG tablet Take 1 tablet by mouth every 6 (six) hours as needed.    . diltiazem (CARDIZEM CD) 240 MG 24 hr capsule Take 1 capsule (240 mg total) by mouth daily. 30 capsule 3  . fenofibrate 160 MG tablet Take 1 tablet (160 mg total) by mouth daily. 90 tablet 1  . Galcanezumab-gnlm (EMGALITY) 120 MG/ML SOSY Every 30 days subcutaneously. 1.12 mL 5  . glucose blood (FREESTYLE TEST STRIPS) test strip 1 each by Other route daily. 100 each 3  . glucose monitoring kit (FREESTYLE) monitoring kit Use daily to check blood sugar.  DX E11.9 1 each 0  . Lancets (FREESTYLE) lancets 1 each by Other route daily. 100 each 3  . Lurasidone HCl 120 MG TABS Take 1 tablet (120 mg total) by mouth at bedtime. 30 tablet 5  . metFORMIN (GLUCOPHAGE) 1000 MG tablet Take 1,000 mg by mouth 2 (two) times daily with a meal.    . metoprolol succinate (TOPROL-XL) 50 MG 24 hr tablet Take 1 tablet (50 mg total) by mouth daily. Take with or immediately following a meal. 90 tablet 3  . nitroGLYCERIN (NITROSTAT) 0.4 MG SL tablet Place 1 tablet (0.4 mg total) under the tongue as needed. 25 tablet 2  . ondansetron (ZOFRAN-ODT) 4 MG disintegrating tablet Take 1 tablet (4 mg total) by mouth every 8 (eight) hours as needed for nausea. 20 tablet 2  . pantoprazole (PROTONIX) 40 MG tablet Take 1 tablet (40 mg total) by mouth daily. 90 tablet 2  . QUEtiapine (SEROQUEL) 300 MG tablet  Take 1 tablet (300 mg total) by mouth at bedtime. 90 tablet 2  . rizatriptan (MAXALT) 10 MG tablet Take 1 tablet (10 mg  total) by mouth as needed for migraine. May repeat in 2 hours if needed 10 tablet 0  . Semaglutide,0.25 or 0.5MG /DOS, (OZEMPIC, 0.25 OR 0.5 MG/DOSE,) 2 MG/1.5ML SOPN Inject 0.25 mg into the skin once a week for 28 days, THEN 0.5 mg once a week for 28 days. 4.5 mL 1  . topiramate (TOPAMAX) 50 MG tablet Take 3 tablets (150 mg total) by mouth in the morning and at bedtime. 540 tablet 2  . zolpidem (AMBIEN CR) 6.25 MG CR tablet Take 1 tablet (6.25 mg total) by mouth at bedtime as needed for sleep. 30 tablet 2   No current facility-administered medications for this visit.      Psychiatric Specialty Exam: Review of Systems  Neurological: Positive for headaches.  Psychiatric/Behavioral: The patient is nervous/anxious.   All other systems reviewed and are negative.   There were no vitals taken for this visit.There is no height or weight on file to calculate BMI.  General Appearance: Casual and Fairly Groomed  Eye Contact:  Good  Speech:  Clear and Coherent and Normal Rate  Volume:  Normal  Mood:  Anxious  Affect:  Full Range  Thought Process:  Goal Directed and Linear  Orientation:  Full (Time, Place, and Person)  Thought Content: Logical   Suicidal Thoughts:  No  Homicidal Thoughts:  No  Memory:  Immediate;   Good Recent;   Good Remote;   Good  Judgement:  Good  Insight:  Good  Psychomotor Activity:  Normal  Concentration:  Concentration: Good  Recall:  Good  Fund of Knowledge: Good  Language: Good  Akathisia:  Negative  Handed:  Right  AIMS (if indicated): not done  Assets:  Communication Skills Desire for Improvement Financial Resources/Insurance Housing Resilience Social Support  ADL's:  Intact  Cognition: WNL  Sleep:  Good   Screenings: PHQ2-9     Office Visit from 05/20/2020 in [employer] at [facility]  PHQ-2 Total  Score 0  PHQ-9 Total Score 0       Assessment and Plan: 64 yo widowed female with bipolar disorder and generalized anxiety disorder. She had been followed by Dr. Lilia Stout a psychiatrist in [facility] who had her on Latuda 120 mg, Seroquel 300 mg (initiated for insomnia), Wellbutrin XL 300 mg daily. She lost her husband last year and on November 1st moved to [place] to stay with her oldest daughter. Her insurance changed and approval for Paula Stout was initially denied. She still has 5 tablets left. Prior to [place] she tried unsucessfully  Lamictal, Abilify, Geodon (these are ones she remembers). Mood has improved, she is not suicidal, hopeless, there are no psychotic symptoms present. She continued to struggle with insomnia both initial and middle so we have tried zolpidem CR - it proved to be quite helpful. While in [place] she was in counseling on monthly basis and she would like to have a therapist here as well. Paula Stout will spend Thanksgiving in [place] state and Christmas in [place] visiting her children/grandchildren.   Dx: Bipolar 1 depressed, GAD  Plan: We will continue current meds: Wellbutrin XL 300 mg, Seroquel 300 mg at HS, Latuda 120 mg and zolpidem CR 6.25 mg prn sleep. Seroquel is primarily for insomnia/anxiety and did not prove to be sufficient without Latuda for mood control.  I will again ask for a therapy appointment. Next visit with me in 3 months. The plan was discussed with patient who had an opportunity to ask questions and these were all  answered. I spend 20 minutes in videoconferencing  with the patient.    Stephanie Acre, MD 07/01/2020, 2:41 PM

## 2020-09-08 ENCOUNTER — Ambulatory Visit: Payer: 59 | Admitting: Cardiology

## 2020-09-16 ENCOUNTER — Encounter: Payer: Self-pay | Admitting: Family Medicine

## 2020-09-22 ENCOUNTER — Telehealth (HOSPITAL_COMMUNITY): Payer: 59 | Admitting: Psychiatry

## 2020-09-29 ENCOUNTER — Ambulatory Visit: Payer: 59 | Admitting: Cardiology

## 2020-10-08 ENCOUNTER — Telehealth (INDEPENDENT_AMBULATORY_CARE_PROVIDER_SITE_OTHER): Payer: 59 | Admitting: Psychiatry

## 2020-10-08 ENCOUNTER — Other Ambulatory Visit: Payer: Self-pay

## 2020-10-08 ENCOUNTER — Other Ambulatory Visit (HOSPITAL_COMMUNITY): Payer: Self-pay | Admitting: Psychiatry

## 2020-10-08 DIAGNOSIS — F411 Generalized anxiety disorder: Secondary | ICD-10-CM

## 2020-10-08 DIAGNOSIS — F319 Bipolar disorder, unspecified: Secondary | ICD-10-CM

## 2020-10-08 DIAGNOSIS — F339 Major depressive disorder, recurrent, unspecified: Secondary | ICD-10-CM

## 2020-10-08 MED ORDER — LURASIDONE HCL 120 MG PO TABS
120.0000 mg | ORAL_TABLET | Freq: Every evening | ORAL | 1 refills | Status: DC
Start: 2020-10-08 — End: 2021-09-16

## 2020-10-08 MED ORDER — QUETIAPINE FUMARATE 300 MG PO TABS: 300.0000 mg | ORAL_TABLET | Freq: Every day | ORAL | 2 refills | Status: DC

## 2020-10-08 MED ORDER — BUPROPION HCL ER (XL) 300 MG PO TB24: 300.0000 mg | ORAL_TABLET | Freq: Every day | ORAL | 2 refills | Status: DC

## 2020-10-08 NOTE — Progress Notes (Signed)
[place] MD/PA/NP OP Progress Note  10/08/2020 10:16 AM Paula Stout  MRN:  [id number] Interview was conducted using videoconferencing application and I verified that I was speaking with the correct person using two identifiers. I discussed the limitations of evaluation and management by telemedicine and  the availability of in person appointments. Patient expressed understanding and agreed to proceed. Participants in the visit: patient (location - home); physician (location - home office).  Chief Complaint: Some anxiety.  HPI: 65 yo widowed female with bipolar disorder and generalized anxiety disorder. She had been followed by Dr. Lilia Pro a psychiatrist in [facility] who had her on Latuda 120 mg, Seroquel 300 mg (initiated for insomnia), Wellbutrin XL 300 mg daily. She lost her husband last year and on November 1st moved to [place] to stay with her oldest daughter. Her insurance changed and approval for [name] was initially denied. Prior to [place] she tried unsucessfully Lamictal, Abilify, Geodon (these are ones she remembers). Mood has improved, she is not suicidal, hopeless, there are no psychotic symptoms present. She continued to struggle with insomnia both initial and middle so we have tried zolpidem CR - it proved to be quite helpful. While in [place] she was in counseling on monthly basis and she would like to have a therapist here as well. She has type 2 DM and hyperlipidemia - labs are monitored by her PCP. Her son wants her to move to [place] where he lives - her daughter does not - this causes stress (she would rather not move).    Visit Diagnosis:    ICD-10-CM   1. GAD (generalized anxiety disorder)  F41.1   2. Depression, recurrent (HCC)  F33.9 buPROPion (WELLBUTRIN XL) 300 MG 24 hr tablet  3. Bipolar 1 disorder (HCC)  F31.9 QUEtiapine (SEROQUEL) 300 MG tablet    Past Psychiatric History: Please see intake H&P.  Past Medical History:  Past Medical History:  Diagnosis Date  . A-fib ([place])    . Anxiety   . Asthma   . Atrial fibrillation ([place]) 09/01/2014   Last Assessment & Plan:  Formatting of this note might be different from the original. A:  Chronic.  Sinus rhythm at this time.  States she was told this during admission at [facility] in the past. P:  On baby aspirin at home will continue.  Low dose metoprolol started.  . Bipolar 1 disorder, depressed, moderate ([place]) 02/18/2020  . Bipolar disorder ([place])   . Cellulitis 02/03/2019  . Chest pain 02/19/2020  . Chronic anticoagulation 02/20/2020  . Chronic atrial fibrillation ([place]) 02/18/2020  . Chronic headache 10/11/2013  . Chronic migraine without aura, intractable, without status migrainosus 06/24/2017  . Chronic pain of both shoulders 11/14/2015  . Colon cancer screening 04/15/2016   Last Assessment & Plan:  Formatting of this note might be different from the original. Will schedule for colonoscopy  . Depression   . Depression, recurrent ([place]) 02/18/2020  . Diabetes mellitus ([place])   . Diabetes mellitus without complication ([place])   . Dyslipidemia 02/20/2020  . GAD (generalized anxiety disorder) 02/18/2020  . Heartburn 04/15/2016   Last Assessment & Plan:  Formatting of this note might be different from the original. Patient was counseled regarding lifestyle modification  Advised to take Prilosec 30 mins before breakfast  Will schedule for EGD  . History of atrial fibrillation 06/24/2017  . History of posttraumatic stress disorder (PTSD) 10/07/2016  . HLD (hyperlipidemia) 09/01/2014   Last Assessment & Plan:  Formatting of this note might be  different from the original. A: Chronic. P: Continue statin.  [name] Hypertriglyceridemia 02/18/2020  . Impaired mobility and activities of daily living 11/14/2015  . Irritable bowel syndrome (IBS)   . Left elbow pain 11/14/2015  . Migraine   . Migraines 10/11/2013   Last Assessment & Plan:  Formatting of this note might be different from the original. A: Chronic. P: Continue Topamax. (also takes it for  bipolar)  . Morbid obesity ([place]) 12/16/2016  . Obesity (BMI 30-39.9) 09/01/2014   Last Assessment & Plan:  Formatting of this note might be different from the original. A: Morbid obesity, with BMI of 35.0 - 39.9 P: Counseled to loose wt.  . Renal colic on left side 6/80/3212  . Sleep-disordered breathing 06/05/2018  . Unstable angina ([place]) 09/01/2014   Last Assessment & Plan:  Formatting of this note might be different from the original. A: Pt has 2 months exertional angina, recently developed angina at rest intermittently.  She had a positive nuclear medicine cardiolyte stress test with anteroapical ischemia on stress portion not seen at rest. P:  - continue aspirin, statin, metoprolol - Discussed stress test findings with cardiologist Dr. Landis Gandy    Past Surgical History:  Procedure Laterality Date  . ABDOMINAL HYSTERECTOMY     Fibroids  . ATRIAL FIBRILLATION ABLATION    . CARDIAC CATHETERIZATION    . CESAREAN SECTION    . KNEE SURGERY    . LEFT HEART CATH AND CORONARY ANGIOGRAPHY N/A 03/04/2020   Procedure: LEFT HEART CATH AND CORONARY ANGIOGRAPHY;  Surgeon: Belva Crome, MD;  Location: [facility];  Service: Cardiovascular;  Laterality: N/A;  . ROTATOR CUFF REPAIR      Family Psychiatric History: Reviewed.  Family History:  Family History  Adopted: Yes  Problem Relation Age of Onset  . Bipolar disorder Son   . Bipolar disorder Daughter     Social History:  Social History   Socioeconomic History  . Marital status: Widowed    Spouse name: Not on file  . Number of children: 6  . Years of education: Not on file  . Highest education level: Not on file  Occupational History  . Not on file  Tobacco Use  . Smoking status: Never Smoker  . Smokeless tobacco: Never Used  Vaping Use  . Vaping Use: Never used  Substance and Sexual Activity  . Alcohol use: Never  . Drug use: Never  . Sexual activity: Not on file  Other Topics Concern  . Not on file  Social History  Narrative  . Not on file   Social Determinants of Health   Financial Resource Strain: Not on file  Food Insecurity: Not on file  Transportation Needs: Not on file  Physical Activity: Not on file  Stress: Not on file  Social Connections: Not on file    Allergies:  Allergies  Allergen Reactions  . Bee Venom Anaphylaxis  . Onion     Other reaction(s): Respiratory Distress  . Sumatriptan Other (See Comments)    Other reaction(s): UNKNOWN Passed out, nose bleed   . Asa [Aspirin] Nausea And Vomiting  . Nsaids Nausea And Vomiting  . Iodine Rash  . Sulfa Antibiotics Rash  . Vancomycin Rash    Metabolic Disorder Labs: Lab Results  Component Value Date   HGBA1C 8.1 (H) 05/20/2020   MPG 186 05/20/2020   MPG 163 02/20/2020   No results found for: PROLACTIN Lab Results  Component Value Date   CHOL 196 02/20/2020  TRIG 199 (H) 02/20/2020   HDL 39 (L) 02/20/2020   CHOLHDL 5.0 02/20/2020   VLDL 40 02/20/2020   LDLCALC NOT CALCULATED 02/20/2020   No results found for: TSH  Therapeutic Level Labs: No results found for: LITHIUM No results found for: VALPROATE No components found for:  CBMZ  Current Medications: Current Outpatient Medications  Medication Sig Dispense Refill  . albuterol (VENTOLIN HFA) 108 (90 Base) MCG/ACT inhaler Inhale 2 puffs into the lungs every 4 (four) hours as needed for wheezing. 1 each 2  . apixaban (ELIQUIS) 5 MG TABS tablet Take 1 tablet (5 mg total) by mouth in the morning and at bedtime. 180 tablet 2  . atorvastatin (LIPITOR) 80 MG tablet Take 1 tablet (80 mg total) by mouth daily. 90 tablet 3  . buPROPion (WELLBUTRIN XL) 300 MG 24 hr tablet Take 1 tablet (300 mg total) by mouth daily. 90 tablet 2  . butalbital-acetaminophen-caffeine (FIORICET) 50-325-40 MG tablet Take 1 tablet by mouth every 6 (six) hours as needed.    . diltiazem (CARDIZEM CD) 240 MG 24 hr capsule Take 1 capsule (240 mg total) by mouth daily. 30 capsule 3  . fenofibrate 160  MG tablet TAKE ONE TABLET BY MOUTH ONE TIME DAILY 90 tablet 1  . Galcanezumab-gnlm (EMGALITY) 120 MG/ML SOSY Every 30 days subcutaneously. 1.12 mL 5  . glucose blood (FREESTYLE TEST STRIPS) test strip 1 each by Other route daily. 100 each 3  . glucose monitoring kit (FREESTYLE) monitoring kit Use daily to check blood sugar.  DX E11.9 1 each 0  . Lancets (FREESTYLE) lancets 1 each by Other route daily. 100 each 3  . Lurasidone HCl 120 MG TABS Take 1 tablet (120 mg total) by mouth at bedtime. 90 tablet 1  . metFORMIN (GLUCOPHAGE) 1000 MG tablet Take 1,000 mg by mouth 2 (two) times daily with a meal.    . metoprolol succinate (TOPROL-XL) 50 MG 24 hr tablet Take 1 tablet (50 mg total) by mouth daily. Take with or immediately following a meal. 90 tablet 3  . nitroGLYCERIN (NITROSTAT) 0.4 MG SL tablet Place 1 tablet (0.4 mg total) under the tongue as needed. 25 tablet 2  . ondansetron (ZOFRAN-ODT) 4 MG disintegrating tablet Take 1 tablet (4 mg total) by mouth every 8 (eight) hours as needed for nausea. 20 tablet 2  . pantoprazole (PROTONIX) 40 MG tablet Take 1 tablet (40 mg total) by mouth daily. 90 tablet 2  . QUEtiapine (SEROQUEL) 300 MG tablet Take 1 tablet (300 mg total) by mouth at bedtime. 90 tablet 2  . rizatriptan (MAXALT) 10 MG tablet Take 1 tablet (10 mg total) by mouth as needed for migraine. May repeat in 2 hours if needed 10 tablet 0  . topiramate (TOPAMAX) 50 MG tablet Take 3 tablets (150 mg total) by mouth in the morning and at bedtime. 540 tablet 2  . zolpidem (AMBIEN CR) 6.25 MG CR tablet Take 1 tablet (6.25 mg total) by mouth at bedtime as needed for sleep. 30 tablet 2   No current facility-administered medications for this visit.    Psychiatric Specialty Exam: Review of Systems  Psychiatric/Behavioral: The patient is nervous/anxious.   All other systems reviewed and are negative.   There were no vitals taken for this visit.There is no height or weight on file to calculate BMI.   General Appearance: Casual  Eye Contact:  Good  Speech:  Clear and Coherent and Normal Rate  Volume:  Normal  Mood:  Anxious  Affect:  Full Range  Thought Process:  Goal Directed  Orientation:  Full (Time, Place, and Person)  Thought Content: Logical   Suicidal Thoughts:  No  Homicidal Thoughts:  No  Memory:  Immediate;   Good Recent;   Good Remote;   Good  Judgement:  Good  Insight:  Good  Psychomotor Activity:  Normal  Concentration:  Concentration: Good  Recall:  Good  Fund of Knowledge: Good  Language: Good  Akathisia:  Negative  Handed:  Right  AIMS (if indicated): not done  Assets:  Communication Skills Desire for Improvement Financial Resources/Insurance Housing Resilience  ADL's:  Intact  Cognition: WNL  Sleep:  Good   Screenings: PHQ2-9   [place] Office Visit from 05/20/2020 in [place] at [employer] [facility]  PHQ-2 Total Score 0  PHQ-9 Total Score 0       Assessment and Plan: *65 yo widowed female with bipolar disorder and generalized anxiety disorder. She had been followed by Dr. Lilia Pro a psychiatrist in [facility] who had her on Latuda 120 mg, Seroquel 300 mg (initiated for insomnia), Wellbutrin XL 300 mg daily. She lost her husband last year and on November 1st moved to [place] to stay with her oldest daughter. Her insurance changed and approval for [name] was initially denied. Prior to [place] she tried unsucessfully Lamictal, Abilify, Geodon (these are ones she remembers). Mood has improved, she is not suicidal, hopeless, there are no psychotic symptoms present. She continued to struggle with insomnia both initial and middle so we have tried zolpidem CR - it proved to be quite helpful. While in [place] she was in counseling on monthly basis and she would like to have a therapist here as well. She has type 2 DM and hyperlipidemia - labs are monitored by her PCP. Her son wants her to move to [place] where he lives - her daughter does not  - this causes stress (she would rather not move).  Dx: Bipolar 1 depressed, GAD  Plan: We will continue current meds: Wellbutrin XL 300 mg, Seroquel 300 mg at HS, Latuda 120 mg and zolpidem CR 6.25 mg prn sleep. Seroquel is primarily for insomnia/anxiety and did not prove to be sufficient without Latuda for mood control.  I will again ask for a therapy appointment. Next visit with me in 3 months with a new provider.The plan was discussed with patient who had an opportunity to ask questions and these were all answered. I spend66mnutes in videoconferencingwith the patient.    OStephanie Acre MD 10/08/2020, 10:16 AM

## 2020-10-09 ENCOUNTER — Telehealth (HOSPITAL_COMMUNITY): Payer: Self-pay | Admitting: *Deleted

## 2020-10-09 ENCOUNTER — Telehealth: Payer: Self-pay | Admitting: Neurology

## 2020-10-09 NOTE — Telephone Encounter (Signed)
Prior authorization submitted for Latuda.

## 2020-10-09 NOTE — Telephone Encounter (Signed)
PA submitted through cover my meds/med impact. [id number] Will await response from insurance

## 2020-10-13 ENCOUNTER — Ambulatory Visit (INDEPENDENT_AMBULATORY_CARE_PROVIDER_SITE_OTHER): Payer: 59 | Admitting: Family Medicine

## 2020-10-13 ENCOUNTER — Telehealth (HOSPITAL_COMMUNITY): Payer: Self-pay | Admitting: *Deleted

## 2020-10-13 ENCOUNTER — Ambulatory Visit (INDEPENDENT_AMBULATORY_CARE_PROVIDER_SITE_OTHER): Payer: 59 | Admitting: Cardiology

## 2020-10-13 ENCOUNTER — Other Ambulatory Visit: Payer: Self-pay

## 2020-10-13 ENCOUNTER — Encounter: Payer: Self-pay | Admitting: Family Medicine

## 2020-10-13 ENCOUNTER — Ambulatory Visit: Payer: 59 | Admitting: Neurology

## 2020-10-13 ENCOUNTER — Encounter: Payer: Self-pay | Admitting: Cardiology

## 2020-10-13 ENCOUNTER — Other Ambulatory Visit: Payer: Self-pay | Admitting: Family Medicine

## 2020-10-13 ENCOUNTER — Encounter: Payer: Self-pay | Admitting: *Deleted

## 2020-10-13 VITALS — BP 108/68 | HR 71 | Temp 97.8°F | Ht 66.0 in | Wt 232.1 lb

## 2020-10-13 VITALS — BP 108/60 | HR 81 | Ht 66.0 in | Wt 232.0 lb

## 2020-10-13 DIAGNOSIS — I480 Paroxysmal atrial fibrillation: Secondary | ICD-10-CM | POA: Diagnosis not present

## 2020-10-13 DIAGNOSIS — Z1159 Encounter for screening for other viral diseases: Secondary | ICD-10-CM

## 2020-10-13 DIAGNOSIS — R079 Chest pain, unspecified: Secondary | ICD-10-CM

## 2020-10-13 DIAGNOSIS — Z0000 Encounter for general adult medical examination without abnormal findings: Secondary | ICD-10-CM | POA: Diagnosis not present

## 2020-10-13 DIAGNOSIS — E1169 Type 2 diabetes mellitus with other specified complication: Secondary | ICD-10-CM

## 2020-10-13 DIAGNOSIS — Z23 Encounter for immunization: Secondary | ICD-10-CM

## 2020-10-13 DIAGNOSIS — Z1231 Encounter for screening mammogram for malignant neoplasm of breast: Secondary | ICD-10-CM | POA: Diagnosis not present

## 2020-10-13 DIAGNOSIS — E669 Obesity, unspecified: Secondary | ICD-10-CM | POA: Diagnosis not present

## 2020-10-13 DIAGNOSIS — R799 Abnormal finding of blood chemistry, unspecified: Secondary | ICD-10-CM

## 2020-10-13 LAB — CBC
HCT: 36 % (ref 36.0–46.0)
Hemoglobin: 11.7 g/dL — ABNORMAL LOW (ref 12.0–15.0)
MCHC: 32.6 g/dL (ref 30.0–36.0)
MCV: 94.9 fl (ref 78.0–100.0)
Platelets: 308 10*3/uL (ref 150.0–400.0)
RBC: 3.79 Mil/uL — ABNORMAL LOW (ref 3.87–5.11)
RDW: 15.6 % — ABNORMAL HIGH (ref 11.5–15.5)
WBC: 5.5 10*3/uL (ref 4.0–10.5)

## 2020-10-13 LAB — COMPREHENSIVE METABOLIC PANEL
ALT: 28 U/L (ref 0–35)
AST: 16 U/L (ref 0–37)
Albumin: 4 g/dL (ref 3.5–5.2)
Alkaline Phosphatase: 51 U/L (ref 39–117)
BUN: 16 mg/dL (ref 6–23)
CO2: 23 mEq/L (ref 19–32)
Calcium: 9.9 mg/dL (ref 8.4–10.5)
Chloride: 108 mEq/L (ref 96–112)
Creatinine, Ser: 1.81 mg/dL — ABNORMAL HIGH (ref 0.40–1.20)
GFR: 29.51 mL/min — ABNORMAL LOW (ref >60.00)
Glucose, Bld: 152 mg/dL — ABNORMAL HIGH (ref 70–99)
Potassium: 4.4 mEq/L (ref 3.5–5.1)
Sodium: 140 mEq/L (ref 135–145)
Total Bilirubin: 0.3 mg/dL (ref 0.2–1.2)
Total Protein: 6.5 g/dL (ref 6.0–8.3)

## 2020-10-13 LAB — LIPID PANEL
Cholesterol: 175 mg/dL (ref 0–200)
HDL: 31.6 mg/dL — ABNORMAL LOW (ref >39.00)
LDL Cholesterol: 104 mg/dL — ABNORMAL HIGH (ref 0–99)
NonHDL: 143.86
Total CHOL/HDL Ratio: 6
Triglycerides: 197 mg/dL — ABNORMAL HIGH (ref 0.0–149.0)
VLDL: 39.4 mg/dL (ref 0.0–40.0)

## 2020-10-13 MED ORDER — EZETIMIBE 10 MG PO TABS: 10.0000 mg | ORAL_TABLET | Freq: Every day | ORAL | 3 refills | Status: DC

## 2020-10-13 MED ORDER — TRULICITY 0.75 MG/0.5ML ~~LOC~~ SOAJ: 0.7500 mg | SUBCUTANEOUS | 5 refills | Status: DC

## 2020-10-13 NOTE — Telephone Encounter (Signed)
Approved the 10/11/20-11/07/2020 through medimpact.

## 2020-10-13 NOTE — Progress Notes (Signed)
Electrophysiology Office Note   Date:  10/13/2020   ID:  Paula, Stout 12/04/1955, MRN [id number]  PCP:  Paula Pal, DO  Cardiologist: Revankar Primary Electrophysiologist:  Monseratt Ledin Paula Leeds, MD    Chief Complaint: Atrial fibrillation   History of Present Illness: Paula Stout is a 65 y.o. female who is being seen today for the evaluation of atrial fibrillation at the request of Paula Stout*. Presenting today for electrophysiology evaluation.  She has a history of paroxysmal atrial fibrillation, type 2 diabetes, hyperlipidemia, CKD stage III.  She moved from [place].  She had an ablation of with CTI and PVI.  She presented to [facility] June 2021 with palpitations and chest pain.  She was found to be in sinus rhythm on presentation.  She wore an apple watch with occlusive recordings.  Catheterization showed no evidence of coronary artery disease.  Today, denies symptoms of shortness of breath, orthopnea, PND, lower extremity edema, claudication, dizziness, presyncope, syncope, bleeding, or neurologic sequela. The patient is tolerating medications without difficulties.  She is continued to have chest pain and palpitations.  She does not have shortness of breath.  She is also quite fatigued.  She wore a cardiac monitor that showed no arrhythmias.  She has more chest pain and palpitations.  She is aware that she has had no arrhythmias.  Her pain is worse with exertion, but does occasionally occur with rest.  She is under quite a bit of stress at home that she feels may be contributing.  Past Medical History:  Diagnosis Date  . A-fib ([place])   . Anxiety   . Asthma   . Atrial fibrillation ([place]) 09/01/2014   Last Assessment & Plan:  Formatting of this note might be different from the original. A:  Chronic.  Sinus rhythm at this time.  States she was told this during admission at [facility] in the past. P:  On baby aspirin at home Champayne Kocian continue.   Low dose metoprolol started.  . Bipolar 1 disorder, depressed, moderate ([place]) 02/18/2020  . Bipolar disorder ([place])   . Cellulitis 02/03/2019  . Chest pain 02/19/2020  . Chronic anticoagulation 02/20/2020  . Chronic atrial fibrillation ([place]) 02/18/2020  . Chronic headache 10/11/2013  . Chronic migraine without aura, intractable, without status migrainosus 06/24/2017  . Chronic pain of both shoulders 11/14/2015  . Colon cancer screening 04/15/2016   Last Assessment & Plan:  Formatting of this note might be different from the original. [name] schedule for colonoscopy  . Depression   . Depression, recurrent ([place]) 02/18/2020  . Diabetes mellitus ([place])   . Diabetes mellitus without complication ([place])   . Dyslipidemia 02/20/2020  . GAD (generalized anxiety disorder) 02/18/2020  . Heartburn 04/15/2016   Last Assessment & Plan:  Formatting of this note might be different from the original. Patient was counseled regarding lifestyle modification  Advised to take Prilosec 30 mins before breakfast  [name] schedule for EGD  . History of atrial fibrillation 06/24/2017  . History of posttraumatic stress disorder (PTSD) 10/07/2016  . HLD (hyperlipidemia) 09/01/2014   Last Assessment & Plan:  Formatting of this note might be different from the original. A: Chronic. P: Continue statin.  [name] Hypertriglyceridemia 02/18/2020  . Impaired mobility and activities of daily living 11/14/2015  . Irritable bowel syndrome (IBS)   . Left elbow pain 11/14/2015  . Migraines 10/11/2013   Last Assessment & Plan:  Formatting of this note might be different from the original. A: Chronic.  P: Continue Topamax. (also takes it for bipolar)  . Morbid obesity ([place]) 12/16/2016  . Obesity (BMI 30-39.9) 09/01/2014   Last Assessment & Plan:  Formatting of this note might be different from the original. A: Morbid obesity, with BMI of 35.0 - 39.9 P: Counseled to loose wt.  . Renal colic on left side 6/56/8127  . Sleep-disordered breathing 06/05/2018  .  Unstable angina ([place]) 09/01/2014   Last Assessment & Plan:  Formatting of this note might be different from the original. A: Pt has 2 months exertional angina, recently developed angina at rest intermittently.  She had a positive nuclear medicine cardiolyte stress test with anteroapical ischemia on stress portion not seen at rest. P:  - continue aspirin, statin, metoprolol - Discussed stress test findings with cardiologist Dr. Landis Gandy   Past Surgical History:  Procedure Laterality Date  . ABDOMINAL HYSTERECTOMY     Fibroids  . ATRIAL FIBRILLATION ABLATION    . CARDIAC CATHETERIZATION    . CESAREAN SECTION    . KNEE SURGERY    . LEFT HEART CATH AND CORONARY ANGIOGRAPHY N/A 03/04/2020   Procedure: LEFT HEART CATH AND CORONARY ANGIOGRAPHY;  Surgeon: Belva Crome, MD;  Location: [facility];  Service: Cardiovascular;  Laterality: N/A;  . ROTATOR CUFF REPAIR       Current Outpatient Medications  Medication Sig Dispense Refill  . albuterol (VENTOLIN HFA) 108 (90 Base) MCG/ACT inhaler Inhale 2 puffs into the lungs every 4 (four) hours as needed for wheezing. 1 each 2  . apixaban (ELIQUIS) 5 MG TABS tablet Take 1 tablet (5 mg total) by mouth in the morning and at bedtime. 180 tablet 2  . atorvastatin (LIPITOR) 80 MG tablet Take 1 tablet (80 mg total) by mouth daily. 90 tablet 3  . buPROPion (WELLBUTRIN XL) 300 MG 24 hr tablet Take 1 tablet (300 mg total) by mouth daily. 90 tablet 2  . butalbital-acetaminophen-caffeine (FIORICET) 50-325-40 MG tablet Take 1 tablet by mouth every 6 (six) hours as needed.    . diltiazem (CARDIZEM CD) 240 MG 24 hr capsule Take 1 capsule (240 mg total) by mouth daily. 30 capsule 3  . Dulaglutide (TRULICITY) 5.17 GY/1.7CB SOPN Inject 0.75 mg into the skin once a week. 2 mL 5  . fenofibrate 160 MG tablet TAKE ONE TABLET BY MOUTH ONE TIME DAILY 90 tablet 1  . Galcanezumab-gnlm (EMGALITY) 120 MG/ML SOSY Every 30 days subcutaneously. 1.12 mL 5  . glucose blood  (FREESTYLE TEST STRIPS) test strip 1 each by Other route daily. 100 each 3  . glucose monitoring kit (FREESTYLE) monitoring kit Use daily to check blood sugar.  DX E11.9 1 each 0  . Lancets (FREESTYLE) lancets 1 each by Other route daily. 100 each 3  . Lurasidone HCl 120 MG TABS Take 1 tablet (120 mg total) by mouth at bedtime. 90 tablet 1  . metFORMIN (GLUCOPHAGE) 1000 MG tablet Take 1,000 mg by mouth 2 (two) times daily with a meal.    . metoprolol succinate (TOPROL-XL) 50 MG 24 hr tablet Take 1 tablet (50 mg total) by mouth daily. Take with or immediately following a meal. 90 tablet 3  . nitroGLYCERIN (NITROSTAT) 0.4 MG SL tablet Place 1 tablet (0.4 mg total) under the tongue as needed. 25 tablet 2  . ondansetron (ZOFRAN-ODT) 4 MG disintegrating tablet Take 1 tablet (4 mg total) by mouth every 8 (eight) hours as needed for nausea. 20 tablet 2  . pantoprazole (PROTONIX) 40 MG tablet  Take 1 tablet (40 mg total) by mouth daily. 90 tablet 2  . QUEtiapine (SEROQUEL) 300 MG tablet Take 1 tablet (300 mg total) by mouth at bedtime. 90 tablet 2  . rizatriptan (MAXALT) 10 MG tablet Take 1 tablet (10 mg total) by mouth as needed for migraine. May repeat in 2 hours if needed 10 tablet 0  . topiramate (TOPAMAX) 50 MG tablet Take 3 tablets (150 mg total) by mouth in the morning and at bedtime. 540 tablet 2  . zolpidem (AMBIEN CR) 6.25 MG CR tablet Take 1 tablet (6.25 mg total) by mouth at bedtime as needed for sleep. 30 tablet 2   No current facility-administered medications for this visit.    Allergies:   Bee venom, Onion, Sumatriptan, Asa [aspirin], Nsaids, Iodine, Sulfa antibiotics, and Vancomycin   Social History:  The patient  reports that she has never smoked. She has never used smokeless tobacco. She reports that she does not drink alcohol and does not use drugs.   Family History:  The patient's family history includes Bipolar disorder in her daughter and son. She was adopted.   ROS:  Please see  the history of present illness.   Otherwise, review of systems is positive for none.   All other systems are reviewed and negative.   PHYSICAL EXAM: VS:  BP 108/60   Pulse 81   Ht '5\' 6"'  (1.676 m)   Wt 232 lb (105.2 kg)   SpO2 98%   BMI 37.45 kg/m  , BMI Body mass index is 37.45 kg/m. GEN: Well nourished, well developed, in no acute distress  HEENT: normal  Neck: no JVD, carotid bruits, or masses Cardiac: RRR; no murmurs, rubs, or gallops,no edema  Respiratory:  clear to auscultation bilaterally, normal work of breathing GI: soft, nontender, nondistended, + BS MS: no deformity or atrophy  Skin: warm and dry Neuro:  Strength and sensation are intact Psych: euthymic mood, full affect  EKG:  EKG is ordered today. Personal review of the ekg ordered shows sinus rhythm, rate 74  Recent Labs: 10/13/2020: ALT 28; BUN 16; Creatinine, Ser 1.81; Hemoglobin 11.7; Platelets 308.0; Potassium 4.4; Sodium 140    Lipid Panel     Component Value Date/Time   CHOL 175 10/13/2020 0856   TRIG 197.0 (H) 10/13/2020 0856   HDL 31.60 (L) 10/13/2020 0856   CHOLHDL 6 10/13/2020 0856   VLDL 39.4 10/13/2020 0856   LDLCALC 104 (H) 10/13/2020 0856     Wt Readings from Last 3 Encounters:  10/13/20 232 lb (105.2 kg)  10/13/20 232 lb 2 oz (105.3 kg)  06/02/20 244 lb (110.7 kg)      Other studies Reviewed: Additional studies/ records that were reviewed today include: TTE 02/20/20  Review of the above records today demonstrates:  1. Left ventricular ejection fraction, by estimation, is 60 to 65%. The  left ventricle has normal function. The left ventricle has no regional  wall motion abnormalities. Left ventricular diastolic parameters are  consistent with Grade I diastolic  dysfunction (impaired relaxation). Elevated left ventricular end-diastolic  pressure.  2. Right ventricular systolic function is normal. The right ventricular  size is normal. There is normal pulmonary artery systolic  pressure. The  estimated right ventricular systolic pressure is 16.1 mmHg.  3. The mitral valve is normal in structure. No evidence of mitral valve  regurgitation. No evidence of mitral stenosis.  4. The aortic valve is normal in structure. Aortic valve regurgitation is  not visualized. No aortic stenosis  is present.  5. Aortic dilatation noted. 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  6. The inferior vena cava is normal in size with greater than 50%  respiratory variability, suggesting right atrial pressure of 3 mmHg.   LHC 03/04/20  Left dominant coronary anatomy  Normal left main  Normal LAD that wraps around the left ventricular apex and supplies one half of the posterior interventricular groove territory.  Large first diagonal/ramus intermedius that covers the distribution typically supplied by diagonal branches.  Nondominant right coronary  Dominant circumflex coronary artery.  The circumflex coronary is tortuous including the first obtuse marginal branch.  Normal left ventricular end-diastolic pressure.  Ventriculography not performed to limit contrast exposure.  Cardiac monitor 05/07/2020 personally reviewed Max 146 bpm 03:36am, 08/16 Min 52 bpm 05:33am, 08/08 Avg 68 bpm 1.7% PACs, <1% PVCs Predominant rhythm was sinus rhythm 3 short SVT episodes, longest 5 beats All triggered events/symptoms associated with sinus rhythm   ASSESSMENT AND PLAN:  1.  Paroxysmal atrial fibrillation: Currently on Eliquis, diltiazem, metoprolol.  Status post ablation 09/27/2018 with PVI and CTI in [place].  She remains in sinus rhythm.  No changes at this time.  2.  Hyperlipidemia: Continue atorvastatin  3.  SVT: Currently on diltiazem and metoprolol.  She is having some fatigue.  I have asked her to take these medications at night.  She wore a recent monitor that showed no further episodes despite symptoms.  No changes.  4.  Chest pain: Has pain  that feels like a squeezing sensation.  No radiation.  Worse with exertion.  Due to her symptoms, Ngoc Daughtridge order a Myoview.  Current medicines are reviewed at length with the patient today.   The patient does not have concerns regarding her medicines.  The following changes were made today: None  Labs/ tests ordered today include:  Orders Placed This Encounter  Procedures  . Cardiac Stress Test: Informed Consent Details: Physician/Practitioner Attestation; Transcribe to consent form and obtain patient signature  . Myocardial Perfusion Imaging  . EKG 12-Lead     Disposition:   FU with Maylani Embree 3 months  Signed, Abygale Karpf Paula Leeds, MD  10/13/2020 2:22 PM     [facility] [address] [place] [address] [phone] (office) [phone] (fax)

## 2020-10-13 NOTE — Progress Notes (Signed)
Chief Complaint  Patient presents with  . Annual Exam     Well Woman Paula Stout is here for a complete physical.   Her last physical was >1 year ago.  Current diet: in general, diet could be better Current exercise: none. Weight is intentionally going down and she denies fatigue out of ordinary. Seatbelt? Yes  Health Maintenance Mammogram- No Colon cancer screening-Yes Shingrix- No Tetanus- Yes Hep C screening- No HIV screening- Yes  Past Medical History:  Diagnosis Date  . A-fib ([place])   . Anxiety   . Asthma   . Atrial fibrillation ([place]) 09/01/2014   Last Assessment & Plan:  Formatting of this note might be different from the original. A:  Chronic.  Sinus rhythm at this time.  States she was told this during admission at [facility] in the past. P:  On baby aspirin at home will continue.  Low dose metoprolol started.  . Bipolar 1 disorder, depressed, moderate ([place]) 02/18/2020  . Bipolar disorder ([place])   . Cellulitis 02/03/2019  . Chest pain 02/19/2020  . Chronic anticoagulation 02/20/2020  . Chronic atrial fibrillation ([place]) 02/18/2020  . Chronic headache 10/11/2013  . Chronic migraine without aura, intractable, without status migrainosus 06/24/2017  . Chronic pain of both shoulders 11/14/2015  . Colon cancer screening 04/15/2016   Last Assessment & Plan:  Formatting of this note might be different from the original. Will schedule for colonoscopy  . Depression   . Depression, recurrent ([place]) 02/18/2020  . Diabetes mellitus ([place])   . Diabetes mellitus without complication ([place])   . Dyslipidemia 02/20/2020  . GAD (generalized anxiety disorder) 02/18/2020  . Heartburn 04/15/2016   Last Assessment & Plan:  Formatting of this note might be different from the original. Patient was counseled regarding lifestyle modification  Advised to take Prilosec 30 mins before breakfast  Will schedule for EGD  . History of atrial fibrillation 06/24/2017  . History of posttraumatic stress disorder  (PTSD) 10/07/2016  . HLD (hyperlipidemia) 09/01/2014   Last Assessment & Plan:  Formatting of this note might be different from the original. A: Chronic. P: Continue statin.  [name] Hypertriglyceridemia 02/18/2020  . Impaired mobility and activities of daily living 11/14/2015  . Irritable bowel syndrome (IBS)   . Left elbow pain 11/14/2015  . Migraines 10/11/2013   Last Assessment & Plan:  Formatting of this note might be different from the original. A: Chronic. P: Continue Topamax. (also takes it for bipolar)  . Morbid obesity ([place]) 12/16/2016  . Obesity (BMI 30-39.9) 09/01/2014   Last Assessment & Plan:  Formatting of this note might be different from the original. A: Morbid obesity, with BMI of 35.0 - 39.9 P: Counseled to loose wt.  . Renal colic on left side 9/51/8841  . Sleep-disordered breathing 06/05/2018  . Unstable angina ([place]) 09/01/2014   Last Assessment & Plan:  Formatting of this note might be different from the original. A: Pt has 2 months exertional angina, recently developed angina at rest intermittently.  She had a positive nuclear medicine cardiolyte stress test with anteroapical ischemia on stress portion not seen at rest. P:  - continue aspirin, statin, metoprolol - Discussed stress test findings with cardiologist Dr. Landis Gandy     Past Surgical History:  Procedure Laterality Date  . ABDOMINAL HYSTERECTOMY     Fibroids  . ATRIAL FIBRILLATION ABLATION    . CARDIAC CATHETERIZATION    . CESAREAN SECTION    . KNEE SURGERY    . LEFT HEART  CATH AND CORONARY ANGIOGRAPHY N/A 03/04/2020   Procedure: LEFT HEART CATH AND CORONARY ANGIOGRAPHY;  Surgeon: Belva Crome, MD;  Location: [facility];  Service: Cardiovascular;  Laterality: N/A;  . ROTATOR CUFF REPAIR      Medications  Current Outpatient Medications on File Prior to Visit  Medication Sig Dispense Refill  . albuterol (VENTOLIN HFA) 108 (90 Base) MCG/ACT inhaler Inhale 2 puffs into the lungs every 4 (four) hours as needed for  wheezing. 1 each 2  . apixaban (ELIQUIS) 5 MG TABS tablet Take 1 tablet (5 mg total) by mouth in the morning and at bedtime. 180 tablet 2  . atorvastatin (LIPITOR) 80 MG tablet Take 1 tablet (80 mg total) by mouth daily. 90 tablet 3  . buPROPion (WELLBUTRIN XL) 300 MG 24 hr tablet Take 1 tablet (300 mg total) by mouth daily. 90 tablet 2  . butalbital-acetaminophen-caffeine (FIORICET) 50-325-40 MG tablet Take 1 tablet by mouth every 6 (six) hours as needed.    . diltiazem (CARDIZEM CD) 240 MG 24 hr capsule Take 1 capsule (240 mg total) by mouth daily. 30 capsule 3  . fenofibrate 160 MG tablet TAKE ONE TABLET BY MOUTH ONE TIME DAILY 90 tablet 1  . Galcanezumab-gnlm (EMGALITY) 120 MG/ML SOSY Every 30 days subcutaneously. 1.12 mL 5  . glucose blood (FREESTYLE TEST STRIPS) test strip 1 each by Other route daily. 100 each 3  . glucose monitoring kit (FREESTYLE) monitoring kit Use daily to check blood sugar.  DX E11.9 1 each 0  . Lancets (FREESTYLE) lancets 1 each by Other route daily. 100 each 3  . Lurasidone HCl 120 MG TABS Take 1 tablet (120 mg total) by mouth at bedtime. 90 tablet 1  . metFORMIN (GLUCOPHAGE) 1000 MG tablet Take 1,000 mg by mouth 2 (two) times daily with a meal.    . metoprolol succinate (TOPROL-XL) 50 MG 24 hr tablet Take 1 tablet (50 mg total) by mouth daily. Take with or immediately following a meal. 90 tablet 3  . nitroGLYCERIN (NITROSTAT) 0.4 MG SL tablet Place 1 tablet (0.4 mg total) under the tongue as needed. 25 tablet 2  . ondansetron (ZOFRAN-ODT) 4 MG disintegrating tablet Take 1 tablet (4 mg total) by mouth every 8 (eight) hours as needed for nausea. 20 tablet 2  . pantoprazole (PROTONIX) 40 MG tablet Take 1 tablet (40 mg total) by mouth daily. 90 tablet 2  . QUEtiapine (SEROQUEL) 300 MG tablet Take 1 tablet (300 mg total) by mouth at bedtime. 90 tablet 2  . rizatriptan (MAXALT) 10 MG tablet Take 1 tablet (10 mg total) by mouth as needed for migraine. May repeat in 2 hours  if needed 10 tablet 0  . topiramate (TOPAMAX) 50 MG tablet Take 3 tablets (150 mg total) by mouth in the morning and at bedtime. 540 tablet 2  . zolpidem (AMBIEN CR) 6.25 MG CR tablet Take 1 tablet (6.25 mg total) by mouth at bedtime as needed for sleep. 30 tablet 2   Allergies Allergies  Allergen Reactions  . Bee Venom Anaphylaxis  . Onion     Other reaction(s): Respiratory Distress  . Sumatriptan Other (See Comments)    Other reaction(s): UNKNOWN Passed out, nose bleed   . Asa [Aspirin] Nausea And Vomiting  . Nsaids Nausea And Vomiting  . Iodine Rash  . Sulfa Antibiotics Rash  . Vancomycin Rash    Review of Systems: Constitutional:  no unexpected weight changes Eye:  no recent significant change in  vision Ear/Nose/Mouth/Throat:  Ears:  no recent change in hearing Nose/Mouth/Throat:  no complaints of nasal congestion, no sore throat Cardiovascular: no chest pain Respiratory:  no shortness of breath Gastrointestinal:  no abdominal pain, no change in bowel habits GU:  Female: negative for dysuria or pelvic pain Musculoskeletal/Extremities:  no pain of the joints Integumentary (Skin/Breast):  no abnormal skin lesions reported Neurologic:  no headaches Endocrine:  denies fatigue  Exam BP 108/68 (BP Location: Left Arm, Patient Position: Sitting, Cuff Size: Normal)   Pulse 71   Temp 97.8 F (36.6 C) (Oral)   Ht '5\' 6"'  (1.676 m)   Wt 232 lb 2 oz (105.3 kg)   SpO2 97%   BMI 37.47 kg/m  General:  well developed, well nourished, in no apparent distress Skin:  no significant moles, warts, or growths Head:  no masses, lesions, or tenderness Eyes:  pupils equal and round, sclera anicteric without injection Ears:  canals without lesions, TMs shiny without retraction, no obvious effusion, no erythema Nose:  nares patent, septum midline, mucosa normal, and no drainage or sinus tenderness Throat/Pharynx:  lips and gingiva without lesion; tongue and uvula midline; non-inflamed  pharynx; no exudates or postnasal drainage Neck: neck supple without adenopathy, thyromegaly, or masses Lungs:  clear to auscultation, breath sounds equal bilaterally, no respiratory distress Cardio:  regular rate and rhythm, no LE edema Abdomen:  abdomen soft, nontender; bowel sounds normal; no masses or organomegaly Genital: Defer to GYN Musculoskeletal:  symmetrical muscle groups noted without atrophy or deformity Extremities:  no clubbing, cyanosis, or edema, no deformities, no skin discoloration Neuro:  gait normal; deep tendon reflexes normal and symmetric Psych: well oriented with normal range of affect and appropriate judgment/insight  Assessment and Plan  Well adult exam  Need for influenza vaccination - Plan: Flu Vaccine QUAD 6+ mos PF IM (Fluarix Quad PF)  Encounter for hepatitis C screening test for low risk patient - Plan: Hepatitis C antibody  Diabetes mellitus type 2 in obese ([place]) - Plan: Ambulatory referral to Ophthalmology, Lipid panel, Comprehensive metabolic panel, CBC  Encounter for screening mammogram for malignant neoplasm of breast - Plan: MM DIGITAL SCREENING BILATERAL   Well 65 y.o. female. Counseled on diet and exercise. Change Ozempic to Trulicity given GI AE's. Payment card given.  Other orders as above. Follow up in 3-6 mo pending above. The patient voiced understanding and agreement to the plan.  [place], DO 10/13/20 8:55 AM

## 2020-10-13 NOTE — Patient Instructions (Signed)
Give us 2-3 business days to get the results of your labs back.   Keep the diet clean and stay active.  The new Shingrix vaccine (for shingles) is a 2 shot series. It can make people feel low energy, achy and almost like they have the flu for 48 hours after injection. Please plan accordingly when deciding on when to get this shot. Call our office for a nurse visit appointment to get this. The second shot of the series is less severe regarding the side effects, but it still lasts 48 hours.   Let us know if you need anything. 

## 2020-10-13 NOTE — Telephone Encounter (Signed)
Prior authorization received for Latuda.  Dates 10/09/2020 thru 10/08/2021. FYI

## 2020-10-13 NOTE — Patient Instructions (Addendum)
Medication Instructions:  Your physician recommends that you continue on your current medications as directed. Please refer to the Current Medication list given to you today.  *If you need a refill on your cardiac medications before your next appointment, please call your pharmacy*   Lab Work: None ordered   Testing/Procedures: Your physician has requested that you have a lexiscan myoview. For further information please visit [email or web address]. Please follow instruction sheet, as given.   Follow-Up: At [facility], you and your health needs are our priority.  As part of our continuing mission to provide you with exceptional heart care, we have created designated Provider Care Teams.  These Care Teams include your primary Cardiologist (physician) and Advanced Practice Providers (APPs -  Physician Assistants and Nurse Practitioners) who all work together to provide you with the care you need, when you need it.  Your next appointment:   3 month(s)  The format for your next appointment:   In Person  Provider:   Allegra Lai, MD    Thank you for choosing [facility]!!   Trinidad Curet, RN [phone]   Other Instructions   Cardiac Nuclear Scan A cardiac nuclear scan is a test that measures blood flow to the heart when a person is resting and when he or she is exercising. The test looks for problems such as:  Not enough blood reaching a portion of the heart.  The heart muscle not working normally. You may need this test if:  You have heart disease.  You have had abnormal lab results.  You have had heart surgery or a balloon procedure to open up blocked arteries (angioplasty).  You have chest pain.  You have shortness of breath. In this test, a radioactive dye (tracer) is injected into your bloodstream. After the tracer has traveled to your heart, an imaging device is used to measure how much of the tracer is absorbed by or distributed to various areas of your  heart. This procedure is usually done at a hospital and takes 2-4 hours. Tell a health care provider about:  Any allergies you have.  All medicines you are taking, including vitamins, herbs, eye drops, creams, and over-the-counter medicines.  Any problems you or family members have had with anesthetic medicines.  Any blood disorders you have.  Any surgeries you have had.  Any medical conditions you have.  Whether you are pregnant or may be pregnant. What are the risks? Generally, this is a safe procedure. However, problems may occur, including:  Serious chest pain and heart attack. This is only a risk if the stress portion of the test is done.  Rapid heartbeat.  Sensation of warmth in your chest. This usually passes quickly.  Allergic reaction to the tracer. What happens before the procedure?  Ask your health care provider about changing or stopping your regular medicines. This is especially important if you are taking diabetes medicines or blood thinners.  Follow instructions from your health care provider about eating or drinking restrictions.  Remove your jewelry on the day of the procedure. What happens during the procedure?  An IV will be inserted into one of your veins.  Your health care provider will inject a small amount of radioactive tracer through the IV.  You will wait for 20-40 minutes while the tracer travels through your bloodstream.  Your heart activity will be monitored with an electrocardiogram (ECG).  You will lie down on an exam table.  Images of your heart will be taken for about  15-20 minutes.  You may also have a stress test. For this test, one of the following may be done: ? You will exercise on a treadmill or stationary bike. While you exercise, your heart's activity will be monitored with an ECG, and your blood pressure will be checked. ? You will be given medicines that will increase blood flow to parts of your heart. This is done if you are  unable to exercise.  When blood flow to your heart has peaked, a tracer will again be injected through the IV.  After 20-40 minutes, you will get back on the exam table and have more images taken of your heart.  Depending on the type of tracer used, scans may need to be repeated 3-4 hours later.  Your IV line will be removed when the procedure is over. The procedure may vary among health care providers and hospitals. What happens after the procedure?  Unless your health care provider tells you otherwise, you may return to your normal schedule, including diet, activities, and medicines.  Unless your health care provider tells you otherwise, you may increase your fluid intake. This will help to flush the contrast dye from your body. Drink enough fluid to keep your urine pale yellow.  Ask your health care provider, or the department that is doing the test: ? When will my results be ready? ? How will I get my results? Summary  A cardiac nuclear scan measures the blood flow to the heart when a person is resting and when he or she is exercising.  Tell your health care provider if you are pregnant.  Before the procedure, ask your health care provider about changing or stopping your regular medicines. This is especially important if you are taking diabetes medicines or blood thinners.  After the procedure, unless your health care provider tells you otherwise, increase your fluid intake. This will help flush the contrast dye from your body.  After the procedure, unless your health care provider tells you otherwise, you may return to your normal schedule, including diet, activities, and medicines. This information is not intended to replace advice given to you by your health care provider. Make sure you discuss any questions you have with your health care provider. Document Revised: 02/06/2018 Document Reviewed: 02/06/2018 Elsevier Patient Education  [place].

## 2020-10-14 ENCOUNTER — Ambulatory Visit (INDEPENDENT_AMBULATORY_CARE_PROVIDER_SITE_OTHER): Payer: 59 | Admitting: Licensed Clinical Social Worker

## 2020-10-14 DIAGNOSIS — F3130 Bipolar disorder, current episode depressed, mild or moderate severity, unspecified: Secondary | ICD-10-CM | POA: Diagnosis not present

## 2020-10-14 DIAGNOSIS — F411 Generalized anxiety disorder: Secondary | ICD-10-CM | POA: Diagnosis not present

## 2020-10-14 LAB — HEPATITIS C ANTIBODY
Hepatitis C Ab: NONREACTIVE
SIGNAL TO CUT-OFF: 0.01 (ref <1.00)
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
## 2020-10-16 ENCOUNTER — Ambulatory Visit (HOSPITAL_COMMUNITY): Payer: 59 | Admitting: Clinical

## 2020-10-20 ENCOUNTER — Encounter (HOSPITAL_BASED_OUTPATIENT_CLINIC_OR_DEPARTMENT_OTHER): Payer: Self-pay

## 2020-10-20 ENCOUNTER — Ambulatory Visit (HOSPITAL_BASED_OUTPATIENT_CLINIC_OR_DEPARTMENT_OTHER)
Admission: RE | Admit: 2020-10-20 | Discharge: 2020-10-20 | Disposition: A | Discharge status: Home / Self Care | Payer: 59 | Source: Ambulatory Visit | Attending: Family Medicine | Admitting: Family Medicine

## 2020-10-20 ENCOUNTER — Other Ambulatory Visit: Payer: Self-pay

## 2020-10-20 ENCOUNTER — Other Ambulatory Visit (INDEPENDENT_AMBULATORY_CARE_PROVIDER_SITE_OTHER): Payer: 59

## 2020-10-20 DIAGNOSIS — Z1231 Encounter for screening mammogram for malignant neoplasm of breast: Secondary | ICD-10-CM | POA: Diagnosis not present

## 2020-10-20 DIAGNOSIS — R799 Abnormal finding of blood chemistry, unspecified: Secondary | ICD-10-CM | POA: Diagnosis not present

## 2020-10-21 ENCOUNTER — Other Ambulatory Visit: Payer: Self-pay | Admitting: *Deleted

## 2020-10-21 ENCOUNTER — Other Ambulatory Visit: Payer: 59

## 2020-10-21 DIAGNOSIS — R799 Abnormal finding of blood chemistry, unspecified: Secondary | ICD-10-CM

## 2020-10-21 LAB — CBC WITH DIFFERENTIAL/PLATELET
Basophils Absolute: 0.1 10*3/uL (ref 0.0–0.1)
Basophils Relative: 1.2 % (ref 0.0–3.0)
Eosinophils Absolute: 0.2 10*3/uL (ref 0.0–0.7)
Eosinophils Relative: 1.8 % (ref 0.0–5.0)
HCT: 36.8 % (ref 36.0–46.0)
Hemoglobin: 11.9 g/dL — ABNORMAL LOW (ref 12.0–15.0)
Lymphocytes Relative: 31 % (ref 12.0–46.0)
Lymphs Abs: 3 10*3/uL (ref 0.7–4.0)
MCHC: 32.4 g/dL (ref 30.0–36.0)
MCV: 95.4 fl (ref 78.0–100.0)
Monocytes Absolute: 0.6 10*3/uL (ref 0.1–1.0)
Monocytes Relative: 6 % (ref 3.0–12.0)
Neutro Abs: 5.8 10*3/uL (ref 1.4–7.7)
Neutrophils Relative %: 60 % (ref 43.0–77.0)
Platelets: 376 10*3/uL (ref 150.0–400.0)
RBC: 3.85 Mil/uL — ABNORMAL LOW (ref 3.87–5.11)
RDW: 15.4 % (ref 11.5–15.5)
WBC: 9.6 10*3/uL (ref 4.0–10.5)

## 2020-10-21 LAB — IBC + FERRITIN
Ferritin: 55.6 ng/mL (ref 10.0–291.0)
Iron: 62 ug/dL (ref 42–145)
Saturation Ratios: 13.3 % — ABNORMAL LOW (ref 20.0–50.0)
Transferrin: 333 mg/dL (ref 212.0–360.0)

## 2020-10-22 ENCOUNTER — Telehealth (HOSPITAL_COMMUNITY): Payer: Self-pay | Admitting: *Deleted

## 2020-10-22 LAB — PATHOLOGIST SMEAR REVIEW

## 2020-10-22 NOTE — Telephone Encounter (Signed)
Patient given detailed instructions per Myocardial Perfusion Study Information Sheet for the test on 10/27/20. Patient notified to arrive 15 minutes early and that it is imperative to arrive on time for appointment to keep from having the test rescheduled.  If you need to cancel or reschedule your appointment, please call the office within 24 hours of your appointment. . Patient verbalized understanding. Kirstie Peri

## 2020-10-27 ENCOUNTER — Ambulatory Visit: Payer: Self-pay | Admitting: Cardiology

## 2020-10-27 ENCOUNTER — Ambulatory Visit (HOSPITAL_COMMUNITY): Payer: 59 | Attending: Internal Medicine

## 2020-10-27 ENCOUNTER — Other Ambulatory Visit: Payer: Self-pay

## 2020-10-27 DIAGNOSIS — R079 Chest pain, unspecified: Secondary | ICD-10-CM

## 2020-10-27 MED ORDER — TECHNETIUM TC 99M TETROFOSMIN IV KIT
30.7000 | PACK | Freq: Once | INTRAVENOUS | Status: AC | PRN
  Administered 2020-10-27: 30.7 via INTRAVENOUS
  Filled 2020-10-27: qty 31

## 2020-10-27 MED ORDER — REGADENOSON 0.4 MG/5ML IV SOLN
0.4000 mg | Freq: Once | INTRAVENOUS | Status: AC
  Administered 2020-10-27: 0.4 mg via INTRAVENOUS

## 2020-10-28 ENCOUNTER — Ambulatory Visit (HOSPITAL_COMMUNITY): Payer: 59 | Attending: Internal Medicine

## 2020-10-28 LAB — MYOCARDIAL PERFUSION IMAGING
LV dias vol: 90 mL (ref 46–106)
LV sys vol: 32 mL
Peak HR: 68 {beats}/min
Rest HR: 49 {beats}/min
SDS: 2
SRS: 0
SSS: 2
TID: 1.07

## 2020-10-28 MED ORDER — TECHNETIUM TC 99M TETROFOSMIN IV KIT
31.0000 | PACK | Freq: Once | INTRAVENOUS | Status: AC | PRN
  Administered 2020-10-28: 31 via INTRAVENOUS
  Filled 2020-10-28: qty 31

## 2020-10-29 ENCOUNTER — Ambulatory Visit (INDEPENDENT_AMBULATORY_CARE_PROVIDER_SITE_OTHER): Payer: 59 | Admitting: Licensed Clinical Social Worker

## 2020-10-29 ENCOUNTER — Other Ambulatory Visit: Payer: Self-pay

## 2020-10-29 DIAGNOSIS — F3130 Bipolar disorder, current episode depressed, mild or moderate severity, unspecified: Secondary | ICD-10-CM

## 2020-10-29 DIAGNOSIS — F411 Generalized anxiety disorder: Secondary | ICD-10-CM | POA: Diagnosis not present

## 2020-10-29 NOTE — Progress Notes (Signed)
Virtual Visit via Video Note   I connected with Paula Stout on 10/29/20 at 11:10am by video enabled telemedicine application and verified that I am speaking with the correct person using two identifiers.   I discussed the limitations, risks, security and privacy concerns of performing an evaluation and management service by video and the availability of in person appointments. I also discussed with the patient that there may be a patient responsible charge related to this service. The patient expressed understanding and agreed to proceed.   I discussed the assessment and treatment plan with the patient. The patient was provided an opportunity to ask questions and all were answered. The patient agreed with the plan and demonstrated an understanding of the instructions.   The patient was advised to call back or seek an in-person evaluation if the symptoms worsen or if the condition fails to improve as anticipated.   I provided 50 minutes of non-face-to-face time during this encounter.      , LCSW, LCAS _____________________________ THERAPIST PROGRESS NOTE   Session Time: 11:10am - 12:00pm  Location: Patient: Patient Home Provider: [facility]    Participation Level: Active   Behavioral Response: Alert, casually dressed, anxious affect/mood   Type of Therapy:  Individual Therapy   Treatment Goals addressed: Medication compliance; Anxiety and depression management; Working with support network to determine best living arrangements      Interventions: CBT, cost/benefit analysis   Summary: Paula Stout is a 65 year old widowed Caucasian female that presented for virtual therapy session today with diagnoses of Generalized anxiety disorder; and Bipolar I Disorder, most recent episode depressed.     Suicidal/Homicidal: None; without plan or intent.    Therapist Response: Clinician met with Paula Stout, who prefers to go by "Paula Stout" for virtual therapy  appointment.  Clinician assessed for safety, medication compliance, and sobriety.  Paula Stout presented for session 10 minutes late due to miscommunication as to whether this would be an in-person or virtual appointment.  She presented virtually as alert, oriented x5, with no evidence or self-report of SI/HI or A/V H.  Paula Stout reported that she continues taking medication as prescribed and denied use of alcohol or illicit substances.  Clinician inquired about Paula Stout's current emotional ratings, as well as any significant changes in thoughts, feelings, or behavior since completion of assessment.  Paula Stout reported scores of 5/10 for depression, 7/10 for anxiety, 2/10 for anger/irritability, and denied experiencing any panic attacks.  Paula Stout reported that her anxiety has been rooted in her inability to decide whether to stay in [place] with her daughter or move to [place] with her son.  Clinician assisted Paula Stout in weighing the pros and cons of each living situation in order to reinforce rational thinking and help her arrive at a decision which best supports her physical and mental health.  Clinician also assisted Paula Stout in determining steps that would need to be taken in order for her to accomplish this move, including sitting down with the son to go over initial concerns, and how much money she would need to save beforehand.  Intervention was effective, as evidenced by Paula Stout reporting that this discussion was helpful for increasing confidence in her decision-making skills and determining what living arrangement would be best for her, stating "It made me see that I'm not responsible for my daughters guilt trip, and weighing the pros and cons was what I needed to realize that.  I can probably work things out with my dog and it won't be   as bad, it could even be better for both of [place]. I need to take the time to talk to my son about the plan.  Maybe I can make this happen in just a few months".  Clinician will continue to  monitor.      Plan: Follow up again in 2 weeks.    Diagnosis: Generalized anxiety disorder; and Bipolar I Disorder, most recent episode depressed   [name], [place], LCAS 10/29/20

## 2020-10-30 ENCOUNTER — Other Ambulatory Visit: Payer: Self-pay

## 2020-10-30 DIAGNOSIS — I480 Paroxysmal atrial fibrillation: Secondary | ICD-10-CM | POA: Insufficient documentation

## 2020-10-30 DIAGNOSIS — E119 Type 2 diabetes mellitus without complications: Secondary | ICD-10-CM | POA: Insufficient documentation

## 2020-10-30 DIAGNOSIS — I4891 Unspecified atrial fibrillation: Secondary | ICD-10-CM | POA: Insufficient documentation

## 2020-10-30 DIAGNOSIS — F319 Bipolar disorder, unspecified: Secondary | ICD-10-CM | POA: Insufficient documentation

## 2020-10-31 ENCOUNTER — Encounter: Payer: Self-pay | Admitting: Cardiology

## 2020-10-31 ENCOUNTER — Other Ambulatory Visit: Payer: Self-pay

## 2020-10-31 ENCOUNTER — Ambulatory Visit (INDEPENDENT_AMBULATORY_CARE_PROVIDER_SITE_OTHER): Payer: 59 | Admitting: Cardiology

## 2020-10-31 VITALS — BP 120/68 | HR 66 | Ht 66.0 in | Wt 233.4 lb

## 2020-10-31 DIAGNOSIS — E669 Obesity, unspecified: Secondary | ICD-10-CM

## 2020-10-31 DIAGNOSIS — E1169 Type 2 diabetes mellitus with other specified complication: Secondary | ICD-10-CM | POA: Diagnosis not present

## 2020-10-31 DIAGNOSIS — E088 Diabetes mellitus due to underlying condition with unspecified complications: Secondary | ICD-10-CM

## 2020-10-31 DIAGNOSIS — E781 Pure hyperglyceridemia: Secondary | ICD-10-CM

## 2020-10-31 DIAGNOSIS — I480 Paroxysmal atrial fibrillation: Secondary | ICD-10-CM

## 2020-10-31 DIAGNOSIS — N183 Chronic kidney disease, stage 3 unspecified: Secondary | ICD-10-CM | POA: Diagnosis not present

## 2020-10-31 MED ORDER — FLECAINIDE ACETATE 50 MG PO TABS
50.0000 mg | ORAL_TABLET | Freq: Two times a day (BID) | ORAL | 6 refills | Status: DC
Start: 2020-10-31 — End: 2021-09-16

## 2020-10-31 NOTE — Progress Notes (Signed)
Cardiology Office Note:    Date:  10/31/2020   ID:  Paula Stout, DOB [birth year], MRN [id number]  PCP:  Paula Pal, DO  Cardiologist:  Paula Lindau, MD   Referring MD: Paula Stout*    ASSESSMENT:    1. Paroxysmal atrial fibrillation (HCC)   2. Hypertriglyceridemia   3. Diabetes mellitus type 2 in obese (HCC)   4. Stage 3 chronic kidney disease, unspecified whether stage 3a or 3b CKD (HCC)   5. Obesity (BMI 30-39.9)   6. Diabetes mellitus due to underlying condition with unspecified complications ([place])    PLAN:    In order of problems listed above:  1. Palpitations: Patient has narrow complex tachycardia as documented by her apple watch.  She showed these tracings to me on the phone.  This happens almost once a week.  No chest pain orthopnea or PND.  No dizziness no syncopal spells.  I will initiate her on flecainide 50 mg twice daily.  I also noted the renal insufficiency issue and checked with the pharmacy and found the stools to be okay.  Her EKG done today was sinus rhythm and normal QRS and she will be back on Monday for a repeat EKG.  Benefits and potential risks with medication was discussed with her and she vocalized understanding.  Questions were answered to her satisfaction. 2. Essential hypertension: Blood pressure stable and diet was emphasized. 3. Mixed dyslipidemia: Diet was emphasized.  Patient is on statin therapy followed by primary care provider. 4. Diabetes mellitus: Managed by primary care.  Hemoglobin A1c is elevated and she is doing her best to diet and exercise. 5. Obesity: Weight reduction was stressed.  Patient is doing well she has lost weight since last evaluation and she is trying her best to do better. 6. She will be seen in follow-up appointment in 2 to 3 weeks or earlier if she has any concerns.  She knows to go to the nearest emergency room for any concerning symptoms.   Medication Adjustments/Labs and Tests  Ordered: Current medicines are reviewed at length with the patient today.  Concerns regarding medicines are outlined above.  Orders Placed This Encounter  Procedures  . EKG 12-Lead   Meds ordered this encounter  Medications  . flecainide (TAMBOCOR) 50 MG tablet    Sig: Take 1 tablet (50 mg total) by mouth 2 (two) times daily.    Dispense:  90 tablet    Refill:  6     No chief complaint on file.    History of Present Illness:    Paula Stout is a 66 y.o. female.  Patient has past medical history of essential hypertension, diabetes mellitus and atrial fibrillation post ablation.  She has also history of SVT.  She mentions to me that on a weekly basis she has palpitations once a week or so.  These last about 30 minutes.  These have affected her quality of life.  No dizziness or syncopal spells.  At the time of my evaluation, the patient is alert awake oriented and in no distress. Past Medical History:  Diagnosis Date  . A-fib ([place])   . Abscess 02/03/2019  . Acute cystitis without hematuria 05/14/2017   Last Assessment & Plan:  Formatting of this note might be different from the original. - suprapubic tenderness with symptoms of urinary frequency and urgency and subjective fevers (no fevers recorded on any visit). Recently passed a renal stone, US shows left hydronephrosis which is improving.  - UA  negative8/24 but pt is clinically symptomatic - will repeat UA  - start pt on Microbid for 5 days  . 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 Anxiety   . Asthma   . At risk for falls 02/04/2019  . Atrial fibrillation ([place]) 09/01/2014   Last Assessment & Plan:  Formatting of this note might be different from the original. A:  Chronic.  Sinus rhythm at this time.  States she was told this during admission at [facility] in the past. P:  On baby aspirin at home will continue.  Low dose metoprolol started.  . Bipolar 1 disorder ([place]) 10/11/2013  . Bipolar 1 disorder, depressed, moderate ([place])  02/18/2020  . Bipolar disorder ([place])   . Cellulitis 02/03/2019  . Chest pain 02/19/2020  . Chronic anticoagulation 02/20/2020  . Chronic atrial fibrillation ([place]) 02/18/2020  . Chronic headache 10/11/2013  . Chronic migraine without aura, intractable, without status migrainosus 06/24/2017  . Chronic pain of both shoulders 11/14/2015  . Colon cancer screening 04/15/2016   Last Assessment & Plan:  Formatting of this note might be different from the original. Will schedule for colonoscopy  . Depression   . Depression, recurrent ([place]) 02/18/2020  . Diabetes mellitus ([place])   . Diabetes mellitus due to underlying condition with unspecified complications ([place]) 0000000  . Diabetes mellitus type 2 in obese ([place]) 10/11/2013   Last Assessment & Plan:  Formatting of this note might be different from the original. A:  Diabetes Mellitus Type 2 without long term use of insulin P:  Hold Home metformin. Reg ISS.  . Diabetes mellitus without complication ([place])   . Dyslipidemia 02/20/2020  . GAD (generalized anxiety disorder) 02/18/2020  . Heartburn 04/15/2016   Last Assessment & Plan:  Formatting of this note might be different from the original. Patient was counseled regarding lifestyle modification  Advised to take Prilosec 30 mins before breakfast  Will schedule for EGD  . History of atrial fibrillation 06/24/2017  . History of posttraumatic stress disorder (PTSD) 10/07/2016  . HLD (hyperlipidemia) 09/01/2014   Last Assessment & Plan:  Formatting of this note might be different from the original. A: Chronic. P: Continue statin.  [name] Hypertriglyceridemia 02/18/2020  . Impaired mobility and activities of daily living 11/14/2015  . Irritable bowel syndrome (IBS)   . Left elbow pain 11/14/2015  . Migraines 10/11/2013   Last Assessment & Plan:  Formatting of this note might be different from the original. A: Chronic. P: Continue Topamax. (also takes it for bipolar)  . Morbid obesity ([place]) 12/16/2016  . Multiple sclerosis ([place])  06/24/2017  . Obesity (BMI 30-39.9) 09/01/2014   Last Assessment & Plan:  Formatting of this note might be different from the original. A: Morbid obesity, with BMI of 35.0 - 39.9 P: Counseled to loose wt.  . Paroxysmal atrial fibrillation ([place]) 04/14/2020  . Renal colic on left side 0000000  . Seizure disorder ([place]) 06/24/2017  . Sleep-disordered breathing 06/05/2018  . Stage 3 chronic kidney disease ([place]) 02/20/2020  . Unstable angina ([place]) 09/01/2014   Last Assessment & Plan:  Formatting of this note might be different from the original. A: Pt has 2 months exertional angina, recently developed angina at rest intermittently.  She had a positive nuclear medicine cardiolyte stress test with anteroapical ischemia on stress portion not seen at rest. P:  - continue aspirin, statin, metoprolol - Discussed stress test findings with cardiologist Dr. Landis Gandy    Past Surgical History:  Procedure Laterality Date  .  ABDOMINAL HYSTERECTOMY     Fibroids  . ATRIAL FIBRILLATION ABLATION    . CARDIAC CATHETERIZATION    . CESAREAN SECTION    . KNEE SURGERY    . LEFT HEART CATH AND CORONARY ANGIOGRAPHY N/A 03/04/2020   Procedure: LEFT HEART CATH AND CORONARY ANGIOGRAPHY;  Surgeon: Belva Crome, MD;  Location: [facility];  Service: Cardiovascular;  Laterality: N/A;  . ROTATOR CUFF REPAIR      Current Medications: Current Meds  Medication Sig  . albuterol (VENTOLIN HFA) 108 (90 Base) MCG/ACT inhaler Inhale 2 puffs into the lungs every 4 (four) hours as needed for wheezing.  . Alogliptin Benzoate 12.5 MG TABS Take 12.5 mg by mouth daily.  [name] apixaban (ELIQUIS) 5 MG TABS tablet Take 1 tablet (5 mg total) by mouth in the morning and at bedtime.  [name] atorvastatin (LIPITOR) 80 MG tablet Take 1 tablet (80 mg total) by mouth daily.  [name] buPROPion (WELLBUTRIN XL) 300 MG 24 hr tablet Take 1 tablet (300 mg total) by mouth daily.  . butalbital-acetaminophen-caffeine (FIORICET) 50-325-40 MG tablet Take 1 tablet by  mouth every 6 (six) hours as needed for migraine.  . diltiazem (CARDIZEM CD) 240 MG 24 hr capsule Take 1 capsule (240 mg total) by mouth daily.  . Dulaglutide (TRULICITY) A999333 0000000 SOPN Inject 0.75 mg into the skin once a week.  . ezetimibe (ZETIA) 10 MG tablet Take 1 tablet (10 mg total) by mouth daily.  . fenofibrate (TRICOR) 145 MG tablet Take 145 mg by mouth daily.  . flecainide (TAMBOCOR) 50 MG tablet Take 1 tablet (50 mg total) by mouth 2 (two) times daily.  . Galcanezumab-gnlm (EMGALITY) 120 MG/ML SOAJ Inject 120 mg into the skin every 30 (thirty) days.  . Lurasidone HCl 120 MG TABS Take 1 tablet (120 mg total) by mouth at bedtime.  . metFORMIN (GLUCOPHAGE) 1000 MG tablet Take 1,000 mg by mouth 2 (two) times daily with a meal.  . metoprolol succinate (TOPROL-XL) 50 MG 24 hr tablet Take 1 tablet (50 mg total) by mouth daily. Take with or immediately following a meal.  . nitroGLYCERIN (NITROSTAT) 0.4 MG SL tablet Place 0.4 mg under the tongue every 5 (five) minutes as needed for chest pain.  [name] ondansetron (ZOFRAN-ODT) 4 MG disintegrating tablet Take 1 tablet (4 mg total) by mouth every 8 (eight) hours as needed for nausea.  . pantoprazole (PROTONIX) 40 MG tablet Take 1 tablet (40 mg total) by mouth daily.  . QUEtiapine (SEROQUEL) 300 MG tablet Take 1 tablet (300 mg total) by mouth at bedtime.  . rizatriptan (MAXALT) 10 MG tablet Take 1 tablet (10 mg total) by mouth as needed for migraine. May repeat in 2 hours if needed  . topiramate (TOPAMAX) 50 MG tablet Take 3 tablets (150 mg total) by mouth in the morning and at bedtime.     Allergies:   Bee venom, Onion, Sumatriptan, Asa [aspirin], Nsaids, Iodine, Sulfa antibiotics, and Vancomycin   Social History   Socioeconomic History  . Marital status: Widowed    Spouse name: Not on file  . Number of children: 6  . Years of education: Not on file  . Highest education level: Not on file  Occupational History  . Not on file  Tobacco Use   . Smoking status: Never Smoker  . Smokeless tobacco: Never Used  Vaping Use  . Vaping Use: Never used  Substance and Sexual Activity  . Alcohol use: Never  . Drug use: Never  .  Sexual activity: Not on file  Other Topics Concern  . Not on file  Social History Narrative  . Not on file   Social Determinants of Health   Financial Resource Strain: Not on file  Food Insecurity: Not on file  Transportation Needs: Not on file  Physical Activity: Not on file  Stress: Not on file  Social Connections: Not on file     Family History: The patient's family history includes Bipolar disorder in her daughter and son. She was adopted.  ROS:   Please see the history of present illness.    All other systems reviewed and are negative.  EKGs/Labs/Other Studies Reviewed:    The following studies were reviewed today: Study Highlights    Nuclear stress EF: 64%.  The left ventricular ejection fraction is normal (55-65%).  No T wave inversion was noted during stress.  There was no ST segment deviation noted during stress.  Defect 1: There is a small defect of mild severity present in the apex location.  This is a low risk study.   Small size, mild intensity fixed apical perfusion defect, likely artifact. No significant reversible ischemia. LVEF 64% with normal wall motion. This is a low risk study.     Recent Labs: 10/13/2020: ALT 28; BUN 16; Creatinine, Ser 1.81; Potassium 4.4; Sodium 140 10/20/2020: Hemoglobin 11.9; Platelets 376.0  Recent Lipid Panel    Component Value Date/Time   CHOL 175 10/13/2020 0856   TRIG 197.0 (H) 10/13/2020 0856   HDL 31.60 (L) 10/13/2020 0856   CHOLHDL 6 10/13/2020 0856   VLDL 39.4 10/13/2020 0856   LDLCALC 104 (H) 10/13/2020 0856    Physical Exam:    VS:  BP 120/68   Pulse 66   Ht '5\' 6"'$  (1.676 m)   Wt 233 lb 6.4 oz (105.9 kg)   SpO2 94%   BMI 37.67 kg/m     Wt Readings from Last 3 Encounters:  10/31/20 233 lb 6.4 oz (105.9 kg)   10/27/20 232 lb (105.2 kg)  10/13/20 232 lb (105.2 kg)     GEN: Patient is in no acute distress HEENT: Normal NECK: No JVD; No carotid bruits LYMPHATICS: No lymphadenopathy CARDIAC: Hear sounds regular, 2/6 systolic murmur at the apex. RESPIRATORY:  Clear to auscultation without rales, wheezing or rhonchi  ABDOMEN: Soft, non-tender, non-distended MUSCULOSKELETAL:  No edema; No deformity  SKIN: Warm and dry NEUROLOGIC:  Alert and oriented x 3 PSYCHIATRIC:  Normal affect   Signed, Paula Lindau, MD  10/31/2020 4:22 PM    [facility]

## 2020-10-31 NOTE — Patient Instructions (Addendum)
Medication Instructions:  Your physician has recommended you make the following change in your medication: \  Start Flecainide 50 mg twice daily.  *If you need a refill on your cardiac medications before your next appointment, please call your pharmacy*   Lab Work: None ordered If you have labs (blood work) drawn today and your tests are completely normal, you will receive your results only by: [name] MyChart Message (if you have MyChart) OR . A paper copy in the mail If you have any lab test that is abnormal or we need to change your treatment, we will call you to review the results.   Testing/Procedures: None ordered   Follow-Up: At [facility], you and your health needs are our priority.  As part of our continuing mission to provide you with exceptional heart care, we have created designated Provider Care Teams.  These Care Teams include your primary Cardiologist (physician) and Advanced Practice Providers (APPs -  Physician Assistants and Nurse Practitioners) who all work together to provide you with the care you need, when you need it.  We recommend signing up for the patient portal called "MyChart".  Sign up information is provided on this After Visit Summary.  MyChart is used to connect with patients for Virtual Visits (Telemedicine).  Patients are able to view lab/test results, encounter notes, upcoming appointments, etc.  Non-urgent messages can be sent to your provider as well.   To learn more about what you can do with MyChart, go to [email or web address].    Your next appointment:   1 month(s)  The format for your next appointment:   In Person  Provider:   Jyl Heinz, MD   Other Instructions  Flecainide tablets What is this medicine? FLECAINIDE (FLEK a nide) is an antiarrhythmic drug. This medicine is used to prevent irregular heart rhythm. It can also slow down fast heartbeats called tachycardia. This medicine may be used for other purposes; ask your health  care provider or pharmacist if you have questions. COMMON BRAND NAME(S): Tambocor What should I tell my health care provider before I take this medicine? They need to know if you have any of these conditions:  abnormal levels of potassium in the blood  heart disease including heart rhythm and heart rate problems  kidney or liver disease  recent heart attack  an unusual or allergic reaction to flecainide, local anesthetics, other medicines, foods, dyes, or preservatives  pregnant or trying to get pregnant  breast-feeding How should I use this medicine? Take this medicine by mouth with a glass of water. Follow the directions on the prescription label. You can take this medicine with or without food. Take your doses at regular intervals. Do not take your medicine more often than directed. Do not stop taking this medicine suddenly. This may cause serious, heart-related side effects. If your doctor wants you to stop the medicine, the dose may be slowly lowered over time to avoid any side effects. Talk to your pediatrician regarding the use of this medicine in children. While this drug may be prescribed for children as young as 1 year of age for selected conditions, precautions do apply. Overdosage: If you think you have taken too much of this medicine contact a poison control center or emergency room at once. NOTE: This medicine is only for you. Do not share this medicine with others. What if I miss a dose? If you miss a dose, take it as soon as you can. If it is almost time for your next  dose, take only that dose. Do not take double or extra doses. What may interact with this medicine? Do not take this medicine with any of the following medications:  amoxapine  arsenic trioxide  certain antibiotics like clarithromycin, erythromycin, gatifloxacin, gemifloxacin, levofloxacin, moxifloxacin, sparfloxacin, or troleandomycin  certain antidepressants called tricyclic antidepressants like  amitriptyline, imipramine, or nortriptyline  certain medicines to control heart rhythm like disopyramide, encainide, moricizine, procainamide, propafenone, and quinidine  cisapride  delavirdine  droperidol  haloperidol  [place]  imatinib  levomethadyl  maprotiline  medicines for malaria like chloroquine and halofantrine  pentamidine  phenothiazines like chlorpromazine, mesoridazine, prochlorperazine, thioridazine  pimozide  quinine  ranolazine  ritonavir  sertindole This medicine may also interact with the following medications:  cimetidine  dofetilide  medicines for angina or high blood pressure  medicines to control heart rhythm like amiodarone and digoxin  ziprasidone This list may not describe all possible interactions. Give your health care provider a list of all the medicines, herbs, non-prescription drugs, or dietary supplements you use. Also tell them if you smoke, drink alcohol, or use illegal drugs. Some items may interact with your medicine. What should I watch for while using this medicine? Visit your doctor or health care professional for regular checks on your progress. Because your condition and the use of this medicine carries some risk, it is a good idea to carry an identification card, necklace or bracelet with details of your condition, medications and doctor or health care professional. Check your blood pressure and pulse rate regularly. Ask your health care professional what your blood pressure and pulse rate should be, and when you should contact him or her. Your doctor or health care professional also may schedule regular blood tests and electrocardiograms to check your progress. You may get drowsy or dizzy. Do not drive, use machinery, or do anything that needs mental alertness until you know how this medicine affects you. Do not stand or sit up quickly, especially if you are an older patient. This reduces the risk of dizzy or fainting  spells. Alcohol can make you more dizzy, increase flushing and rapid heartbeats. Avoid alcoholic drinks. What side effects may I notice from receiving this medicine? Side effects that you should report to your doctor or health care professional as soon as possible:  chest pain, continued irregular heartbeats  difficulty breathing  swelling of the legs or feet  trembling, shaking  unusually weak or tired Side effects that usually do not require medical attention (report to your doctor or health care professional if they continue or are bothersome):  blurred vision  constipation  headache  nausea, vomiting  stomach pain This list may not describe all possible side effects. Call your doctor for medical advice about side effects. You may report side effects to FDA at 1-800-FDA-1088. Where should I keep my medicine? Keep out of the reach of children. Store at room temperature between 15 and 30 degrees C (59 and 86 degrees F). Protect from light. Keep container tightly closed. Throw away any unused medicine after the expiration date. NOTE: This sheet is a summary. It may not cover all possible information. If you have questions about this medicine, talk to your doctor, pharmacist, or health care provider.  2021 Elsevier/Gold Standard (2018-08-14 11:41:38)

## 2020-11-03 ENCOUNTER — Ambulatory Visit (INDEPENDENT_AMBULATORY_CARE_PROVIDER_SITE_OTHER): Payer: 59

## 2020-11-03 ENCOUNTER — Other Ambulatory Visit: Payer: Self-pay

## 2020-11-03 VITALS — BP 142/92 | HR 66 | Ht 66.0 in | Wt 232.1 lb

## 2020-11-03 DIAGNOSIS — Z5181 Encounter for therapeutic drug level monitoring: Secondary | ICD-10-CM | POA: Diagnosis not present

## 2020-11-03 DIAGNOSIS — Z79899 Other long term (current) drug therapy: Secondary | ICD-10-CM | POA: Diagnosis not present

## 2020-11-04 ENCOUNTER — Other Ambulatory Visit: Payer: Self-pay | Admitting: Family Medicine

## 2020-11-04 ENCOUNTER — Telehealth: Payer: Self-pay | Admitting: Neurology

## 2020-11-04 DIAGNOSIS — G43109 Migraine with aura, not intractable, without status migrainosus: Secondary | ICD-10-CM

## 2020-11-04 DIAGNOSIS — K219 Gastro-esophageal reflux disease without esophagitis: Secondary | ICD-10-CM

## 2020-11-04 DIAGNOSIS — I482 Chronic atrial fibrillation, unspecified: Secondary | ICD-10-CM

## 2020-11-04 NOTE — Telephone Encounter (Signed)
PA submitted through cover my meds/medimpact. KEY: [id number] The request has been approved. The authorization is effective for a maximum of 12 fills from 11/04/2020 to 11/03/2021.This has been approved for a quantity limit of 1.0 with a day supply limit of 30.0.

## 2020-11-06 MED ORDER — LEVOCETIRIZINE DIHYDROCHLORIDE 5 MG PO TABS: 5.0000 mg | ORAL_TABLET | Freq: Every evening | ORAL | 2 refills | Status: DC

## 2020-11-12 ENCOUNTER — Ambulatory Visit (INDEPENDENT_AMBULATORY_CARE_PROVIDER_SITE_OTHER): Payer: 59 | Admitting: Licensed Clinical Social Worker

## 2020-11-12 ENCOUNTER — Other Ambulatory Visit: Payer: Self-pay

## 2020-11-12 DIAGNOSIS — F411 Generalized anxiety disorder: Secondary | ICD-10-CM

## 2020-11-12 DIAGNOSIS — F3130 Bipolar disorder, current episode depressed, mild or moderate severity, unspecified: Secondary | ICD-10-CM

## 2020-11-12 NOTE — Progress Notes (Signed)
THERAPIST PROGRESS NOTE   Session Time: 11:00am - 12:00pm  Location: Patient: [facility] Provider: [facility]    Participation Level: Active   Behavioral Response: Alert, casually dressed, anxious affect/mood   Type of Therapy:  Individual Therapy   Treatment Goals addressed: Medication compliance; Anxiety and depression management; Working with support network to determine best living arrangements    Interventions: CBT, assertive communication skills, mindful breathing meditation    Summary: Paula Stout is a 65 year old widowed Caucasian female that presented for in-person therapy session today with diagnoses of Generalized anxiety disorder; and Bipolar I Disorder, most recent episode depressed.     Suicidal/Homicidal: None; without plan or intent.    Therapist Response: Clinician met with Paula Stout, who prefers to go by Paula Stout for in-person appointment and assessed for safety, medication compliance, and sobriety.  Paula Stout presented to session on time and was alert, oriented x5, with no evidence or self-report of active SI/HI or A/V H.  Paula Stout reported ongoing compliance with medication and denied use of alcohol or illicit substances.  Clinician inquired about Paula Stout's emotional ratings today, as well as any significant changes in thoughts, feelings, or behavior since last check-in.  Paula Stout reported scores of 5/10 for depression, 7/10 for anxiety, 0/10 for anger/irritability, and denied any panic attacks or outbursts.  Paula Stout reported that following last session, she did end up speaking with her son about the possibility of moving in with him over following weeks, and they were able to find solutions for her concerns, including handling logistics involving how to move her belongings, and how much money she needs to save for [employer] fees.  Paula Stout reported that they decided she will move in late May or early June, but her only concern now is when and how to inform her daughter of this  decision, as this daughter threw a fit in January when Paula Stout originally proposed the transition, and Paula Stout backed down as a result.  Clinician discussed communication styles (i.e. passive, assertive, and aggressive) with Paula Stout, including strategies for assertively addressing disagreements with her daughter without resorting to previously passive approach when faced with hostility or manipulative tactics.  Clinician also invited Paula Stout to practice mindful breathing meditation today in order to build upon foundation of relaxation skills, as she was noticeably restless when discussing these anxieties.  Paula Stout was agreeable to this, so clinician guided her through process of getting comfortable, achieving a relaxing breathing rhythm, and then focusing upon maintaining this for 10 minutes while allowing intrusive thoughts and feelings to be acknowledged, but then let go of.  Interventions were effective, as evidenced by Paula Stout participating in activity successfully and reporting that she did feel more relaxed afterward, stating I was able to keep my mind in one place and it wasnt as difficult as I thought itd be.  Paula Stout also reported greater confidence in her ability to address upcoming transition with her daughter, stating Talking about what I need to say to my daughter made me feel a lot better.  I need to go ahead and get it over with, or it could be a lot worse. Clinician will continue to monitor.     Plan: Follow up again in 2 weeks.    Diagnosis: Generalized anxiety disorder; and Bipolar I Disorder, most recent episode depressed   [name], Paula Stout, LCAS 11/12/20

## 2020-11-13 ENCOUNTER — Ambulatory Visit (INDEPENDENT_AMBULATORY_CARE_PROVIDER_SITE_OTHER): Payer: 59 | Admitting: Neurology

## 2020-11-13 ENCOUNTER — Encounter: Payer: Self-pay | Admitting: Neurology

## 2020-11-13 VITALS — BP 134/88 | HR 68 | Ht 66.0 in | Wt 230.0 lb

## 2020-11-13 DIAGNOSIS — I482 Chronic atrial fibrillation, unspecified: Secondary | ICD-10-CM | POA: Diagnosis not present

## 2020-11-13 DIAGNOSIS — F411 Generalized anxiety disorder: Secondary | ICD-10-CM | POA: Diagnosis not present

## 2020-11-13 DIAGNOSIS — G43011 Migraine without aura, intractable, with status migrainosus: Secondary | ICD-10-CM

## 2020-11-13 DIAGNOSIS — G43711 Chronic migraine without aura, intractable, with status migrainosus: Secondary | ICD-10-CM

## 2020-11-13 MED ORDER — BUTALBITAL-APAP-CAFFEINE 50-325-40 MG PO TABS: 1.0000 | ORAL_TABLET | Freq: Four times a day (QID) | ORAL | 2 refills | Status: DC | PRN

## 2020-11-13 MED ORDER — VALPROATE SODIUM 100 MG/ML IV SOLN: 1000.0000 mg | Freq: Once | INTRAVENOUS | 1 refills | Status: DC | PRN

## 2020-11-13 NOTE — Patient Instructions (Signed)

## 2020-11-13 NOTE — Progress Notes (Signed)
Provider:  Larey Seat, MD  Primary Care Physician:  Paula Stout, [address] [place] [address]     Referring Provider: Ileene Stout [address] [place],  [place] [address]          Chief Complaint according to patient   Patient presents with:    . New Patient (Initial Visit)     pt states that she has suffered with migraines for years and the medication is not keeping them under control anymore. she was taking Fiorcet. She has tried metoporl, topirmate, wellbutrin, rizatripan. none of this is keeping the migraines at [place]. she is averaging 8 migraines a month if not more and they are becoming more frequent. she has had to go to ER twice in last month because of them. she received a migraine cocktail while there (fentanyl,benadryl and phenergan). Pt alone, rm 11. Pt presents today with her migraines worsening. She states that when she started the [facility] it helped initially. Then around Jan she started to notice it was only last maybe 1 week of relief. States she had her last shot on march 6 and that she has had horrible headaches everyday this week to the point that she almost went to ER. Current states that she is avg 2-3 migraines a week      Other For headache management pt has tried and failed metoporol, topiramate, wellbutrin, amitriptyline, propranolol, emgality. Emergency relief-fioricet and rizatriptan          HISTORY OF PRESENT ILLNESS:  Paula Stout is a 65  year old  Caucasian female patient is seen here in a RV on  11/13/2020  for a migraine evaluation. She has been screened for sleep apnea in the past and tested negative.  Mrs. Paula Stout underwent a home sleep test dated 07 May 2020 as part of her migraine evaluation she had a very mild form of apnea her AHI was 6.7/h and treatment was considered optional in rem sleep her AHI was 16.5 I recommended that she can try CPAP and suggested pressures  between 12.5 and 12 cmH2O this 2 cm EPR but I am by no means sure that controlling such mild apnea would have had a big effect on her headaches. She is not very often woken by headaches and has rare morning headaches.  She is very ljttery here today, described a 2-3 migraine per week frequency and is restless. She has migraines with durations form hours to severeal days. No food triggers were associated, but in the menstrual cycle - in 2000, this patient has had hysterectomy.    For headache management pt has tried and failed metoporol, topiramate, wellbutrin, amitriptyline, propranolol, emgality. Emergency relief-fioricet and rizatriptan, allergic to sumatriptan, no help from frova and some effect with maxalt. Botox has helped but didn't last long, much-    Never had a combi of injection botox and emgality/ ajovy- last night had vomiting and pounding headaches. Never had depakote infusion.  Will inquire today.  PS: She is constantly moving- phenergan and reglan have been used in the past. Part of Bipolar disease ? 2 out of 6 children have migraines.     Chief concern according to patient : long time migraine patient, headaches are more frequent and more intense. She indicated she is under a lot of stress.  Retired in [place] from [place] after her husband's and mother's death.  She moved to [place] to be closer  to her daughter.    I have the pleasure of seeing Paula Stout today, a left-handed Caucasian female who has a past medical history of A-fib ([place]), Anxiety, Asthma, Bipolar disorder (HCC)/ Depression, Diabetes mellitus without complication ([place]), Irritable bowel syndrome (IBS), and Migraine. Akathesia.    The patient had the first sleep study in the year 2015.  Family medical history:  one SON of 3  AND 2 of her 3 Advance,   Social history:  Patient is adopted-  retired from [employer]  and lives in a household with her daughter and grandchildren.  Family status is widowed.   Pets are present. Dogs and cats.  Tobacco use- never .  ETOH use ; seldomly ,  Caffeine intake in form of Coffee(1-2 ) Soda( 0) Tea ( 0) nor energy drinks, rarely chocolate. Regular exercise: none.   Hobbies : sewing.  Migraines are present in AM, and 3-4 days a week. No aura. Vertigo can be present.  There is nausea and emesis. Photophobia and less phonophobia. She has responded to MSG with migraines. Sleep deprivation, weather changes.  Migraines.  lasting a day when bad.     Initial visit : Fiorecet not helping much anymore.  Not on preventives. Has tried propanolol, amitriptyline, she is on topamax now 150 mg bid.  Not tried emgaity , ajovy or aimovig.     Review of Systems: Out of a complete 14 system review, the patient complains of only the following symptoms, and all other reviewed systems are negative.:  Fatigue, sleepiness , snoring,  Insomnia.  On seroquel. Bipolar and insomnia treatment.   "all I want to do is sleep"  FSS endorsed at 47/ 63 points.   Social History   Socioeconomic History  . Marital status: Widowed    Spouse name: Not on file  . Number of children: 6  . Years of education: Not on file  . Highest education level: Not on file  Occupational History  . Not on file  Tobacco Use  . Smoking status: Never Smoker  . Smokeless tobacco: Never Used  Vaping Use  . Vaping Use: Never used  Substance and Sexual Activity  . Alcohol use: Never  . Drug use: Never  . Sexual activity: Not on file  Other Topics Concern  . Not on file  Social History Narrative  . 1 cup of coffee a day.    Social Determinants of Health   Financial Resource Strain: Not on file  Food Insecurity: Not on file  Transportation Needs: Not on file  Physical Activity: Not on file  Stress: Not on file  Social Connections: Not on file    Family History  Adopted: Yes  Problem Relation Age of Onset  . Bipolar disorder Son   . Bipolar disorder Daughter     Past Medical  History:  Diagnosis Date  . A-fib ([place])   . Abscess 02/03/2019  . Acute cystitis without hematuria 05/14/2017   Last Assessment & Plan:  Formatting of this note might be different from the original. - suprapubic tenderness with symptoms of urinary frequency and urgency and subjective fevers (no fevers recorded on any visit). Recently passed a renal stone, US shows left hydronephrosis which is improving.  - UA negative8/24 but pt is clinically symptomatic - will repeat UA  - start pt on Microbid for 5 days  . 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 Anxiety   . Asthma   . At risk for falls 02/04/2019  .  Atrial fibrillation ([place]) 09/01/2014   Last Assessment & Plan:  Formatting of this note might be different from the original. A:  Chronic.  Sinus rhythm at this time.  States she was told this during admission at [facility] in the past. P:  On baby aspirin at home will continue.  Low dose metoprolol started.  . Bipolar 1 disorder ([place]) 10/11/2013  . Bipolar 1 disorder, depressed, moderate ([place]) 02/18/2020  . Bipolar disorder ([place])   . Cellulitis 02/03/2019  . Chest pain 02/19/2020  . Chronic anticoagulation 02/20/2020  . Chronic atrial fibrillation ([place]) 02/18/2020  . Chronic headache 10/11/2013  . Chronic migraine without aura, intractable, without status migrainosus 06/24/2017  . Chronic pain of both shoulders 11/14/2015  . Colon cancer screening 04/15/2016   Last Assessment & Plan:  Formatting of this note might be different from the original. Will schedule for colonoscopy  . Depression   . Depression, recurrent ([place]) 02/18/2020  . Diabetes mellitus ([place])   . Diabetes mellitus due to underlying condition with unspecified complications ([place]) 0000000  . Diabetes mellitus type 2 in obese ([place]) 10/11/2013   Last Assessment & Plan:  Formatting of this note might be different from the original. A:  Diabetes Mellitus Type 2 without long term use of insulin P:  Hold Home metformin. Reg ISS.  . Diabetes  mellitus without complication ([place])   . Dyslipidemia 02/20/2020  . GAD (generalized anxiety disorder) 02/18/2020  . Heartburn 04/15/2016   Last Assessment & Plan:  Formatting of this note might be different from the original. Patient was counseled regarding lifestyle modification  Advised to take Prilosec 30 mins before breakfast  Will schedule for EGD  . History of atrial fibrillation 06/24/2017  . History of posttraumatic stress disorder (PTSD) 10/07/2016  . HLD (hyperlipidemia) 09/01/2014   Last Assessment & Plan:  Formatting of this note might be different from the original. A: Chronic. P: Continue statin.  [name] Hypertriglyceridemia 02/18/2020  . Impaired mobility and activities of daily living 11/14/2015  . Irritable bowel syndrome (IBS)   . Left elbow pain 11/14/2015  . Migraines 10/11/2013   Last Assessment & Plan:  Formatting of this note might be different from the original. A: Chronic. P: Continue Topamax. (also takes it for bipolar)  . Morbid obesity ([place]) 12/16/2016  . Multiple sclerosis ([place]) 06/24/2017  . Obesity (BMI 30-39.9) 09/01/2014   Last Assessment & Plan:  Formatting of this note might be different from the original. A: Morbid obesity, with BMI of 35.0 - 39.9 P: Counseled to loose wt.  . Paroxysmal atrial fibrillation ([place]) 04/14/2020  . Renal colic on left side 0000000  . Seizure disorder ([place]) 06/24/2017  . Sleep-disordered breathing 06/05/2018  . Stage 3 chronic kidney disease ([place]) 02/20/2020  . Unstable angina ([place]) 09/01/2014   Last Assessment & Plan:  Formatting of this note might be different from the original. A: Pt has 2 months exertional angina, recently developed angina at rest intermittently.  She had a positive nuclear medicine cardiolyte stress test with anteroapical ischemia on stress portion not seen at rest. P:  - continue aspirin, statin, metoprolol - Discussed stress test findings with cardiologist Dr. Landis Gandy    Past Surgical History:  Procedure Laterality Date   . ABDOMINAL HYSTERECTOMY     Fibroids  . ATRIAL FIBRILLATION ABLATION    . CARDIAC CATHETERIZATION    . CESAREAN SECTION    . KNEE SURGERY    . LEFT HEART CATH AND CORONARY ANGIOGRAPHY N/A 03/04/2020  Procedure: LEFT HEART CATH AND CORONARY ANGIOGRAPHY;  Surgeon: Belva Crome, MD;  Location: [facility];  Service: Cardiovascular;  Laterality: N/A;  . ROTATOR CUFF REPAIR       Current Outpatient Medications on File Prior to Visit  Medication Sig Dispense Refill  . albuterol (VENTOLIN HFA) 108 (90 Base) MCG/ACT inhaler Inhale 2 puffs into the lungs every 4 (four) hours as needed for wheezing. 1 each 2  . atorvastatin (LIPITOR) 80 MG tablet Take 1 tablet (80 mg total) by mouth daily. 90 tablet 3  . buPROPion (WELLBUTRIN XL) 300 MG 24 hr tablet Take 1 tablet (300 mg total) by mouth daily. 90 tablet 2  . butalbital-acetaminophen-caffeine (FIORICET) 50-325-40 MG tablet Take 1 tablet by mouth every 6 (six) hours as needed for migraine.    . Dulaglutide (TRULICITY) A999333 0000000 SOPN Inject 0.75 mg into the skin once a week. 2 mL 5  . ELIQUIS 5 MG TABS tablet TAKE ONE TABLET BY MOUTH TWICE A DAY EVERY MORNING AND AT BEDTIME 180 tablet 2  . ezetimibe (ZETIA) 10 MG tablet Take 1 tablet (10 mg total) by mouth daily. 30 tablet 3  . fenofibrate (TRICOR) 145 MG tablet Take 145 mg by mouth daily.    . flecainide (TAMBOCOR) 50 MG tablet Take 1 tablet (50 mg total) by mouth 2 (two) times daily. 90 tablet 6  . Galcanezumab-gnlm (EMGALITY) 120 MG/ML SOAJ Inject 120 mg into the skin every 30 (thirty) days.    [name] levocetirizine (XYZAL) 5 MG tablet Take 1 tablet (5 mg total) by mouth every evening. 30 tablet 2  . Lurasidone HCl 120 MG TABS Take 1 tablet (120 mg total) by mouth at bedtime. 90 tablet 1  . metFORMIN (GLUCOPHAGE) 1000 MG tablet TAKE ONE TABLET BY MOUTH TWICE A DAY WITH MEAL 180 tablet 2  . metoprolol succinate (TOPROL-XL) 50 MG 24 hr tablet Take 1 tablet (50 mg total) by mouth daily. Take  with or immediately following a meal. 90 tablet 3  . nitroGLYCERIN (NITROSTAT) 0.4 MG SL tablet Place 0.4 mg under the tongue every 5 (five) minutes as needed for chest pain.    [name] ondansetron (ZOFRAN-ODT) 4 MG disintegrating tablet Take 1 tablet (4 mg total) by mouth every 8 (eight) hours as needed for nausea. 20 tablet 2  . pantoprazole (PROTONIX) 40 MG tablet TAKE ONE TABLET BY MOUTH ONE TIME DAILY 90 tablet 2  . QUEtiapine (SEROQUEL) 300 MG tablet Take 1 tablet (300 mg total) by mouth at bedtime. 90 tablet 2  . rizatriptan (MAXALT) 10 MG tablet Take 1 tablet (10 mg total) by mouth as needed for migraine. May repeat in 2 hours if needed 10 tablet 0  . topiramate (TOPAMAX) 50 MG tablet TAKE THREE TABLETS BY MOUTH EVERY MORNING AND TAKE THREE TABLETS BY MOUTH ONE TIME DAILY AT BEDTIME 540 tablet 2   No current facility-administered medications on file prior to visit.    Allergies  Allergen Reactions  . Bee Venom Anaphylaxis  . Onion     Other reaction(s): Respiratory Distress  . Sumatriptan Other (See Comments)    Other reaction(s): UNKNOWN Passed out, nose bleed   . Asa [Aspirin] Nausea And Vomiting  . Nsaids Nausea And Vomiting  . Iodine Rash  . Sulfa Antibiotics Rash  . Vancomycin Rash    Physical exam:  Today's Vitals   11/13/20 1053  BP: 134/88  Pulse: 68  Weight: 230 lb (104.3 kg)  Height: '5\' 6"'$  (1.676 m)  Body mass index is 37.12 kg/m.   Wt Readings from Last 3 Encounters:  11/13/20 230 lb (104.3 kg)  11/03/20 232 lb 1.3 oz (105.3 kg)  10/31/20 233 lb 6.4 oz (105.9 kg)     Ht Readings from Last 3 Encounters:  11/13/20 '5\' 6"'$  (1.676 m)  11/03/20 '5\' 6"'$  (1.676 m)  10/31/20 '5\' 6"'$  (1.676 m)      General: The patient is awake, alert and appears not in acute distress. The patient is well groomed. Head: Normocephalic, atraumatic. Neck is supple. Cardiovascular:  Regular rate and cardiac rhythm by pulse,  without distended neck veins. Respiratory: Lungs are clear to  auscultation.  Skin:  pale - Without evidence of ankle edema, or rash. Trunk: The patient's posture is erect.   Neurologic exam : The patient is awake and alert, oriented to place and time.   Memory subjective described as intact.  Attention span & concentration ability appears normal.  Speech is fluent,  without  dysarthria, dysphonia or aphasia.  Mood and affect are appropriate.   Cranial nerves: no loss of smell or taste reported  Pupils are equally dilated and briskly reactive to light. Funduscopic exam deferred.   Delayed consensual pupillary reaction with direct light shone into the righteye.  Extraocular movements in vertical and horizontal planes were intact and without nystagmus. No Diplopia. Visual fields by finger perimetry are intact. Hearing was intact to soft voice and finger rubbing.    Facial sensation intact to fine touch.  Facial motor strength is symmetric and tongue and uvula move midline.  Neck ROM : rotation, tilt and flexion extension were normal for age and shoulder shrug was symmetrical.    Motor exam:  Symmetric bulk, tone and ROM.   jittery, restless, shivering.  Normal tone without cog wheeling, symmetric grip strength .   Sensory:  Fine touch, pinprick and vibration were tested  and  normal.  Proprioception tested in the upper extremities was normal.   Coordination: Rapid alternating movements in the fingers/hands were of normal speed.  The Finger-to-nose maneuver was intact without evidence of ataxia, dysmetria or tremor.   Gait and station: Patient could rise unassisted from a seated position, walked without assistive device.  Stance is of normal width/ base and the patient turned with 3 steps.  Toe and heel walk were deferred.  Deep tendon reflexes: in the  upper and lower extremities are symmetric and intact.  Babinski response was deferred .      After spending a total time of  30  minutes face to face and additional time for physical and  neurologic examination, review of laboratory studies,  personal review of imaging studies, reports and results of other testing and review of referral information / records as far as provided in visit, I have established the following assessments:  Paula Stout "[name]" [place] is a 65 year old female patient who has recently moved from [place] to [place], now living with her daughter in her daughter's home.  She has had difficulties controlling her diabetes as she is no longer in charge of her cooking or meal preparation or shopping.  She has noted an increase in frequency and intensity of migraines which are not a new condition but an evolving condition for her.  She has been taking Fioricet but he had already tried beta-blocker topiramate which she is currently still on and she was treated her acute migraines with rizatriptan.  She is averaging 3 to 4 days of migrainous headaches per week, wakes  up with morning headaches, has a BMI of over 39 and a history of diabetes and atrial fibrillation but tested negative for sleep apnea 5 years ago in [place].  At this time I think she needs to evaluations #1 she cannot receive a migraine cocktail frequently so given her underlying medical conditions it would be best to start in addition to topiramate which she already takes, is an injectable I suggest Emgality or Aimovig- I will have to check with her the bright health insurance to see which one is the best covered.  She has indeed been on 3 or more preventatives which have failed.   #2 I do think she needs another home sleep test if her previous sleep study is 5 years ago older she still has risk factors and poor diabetes control, inability to lose weight, and socioeconomic stressors all can contribute to headaches and a higher risk of obstructive sleep apnea as well.  I will be happy to refill the triptan medication for acute headaches while slowly titrating her up to hopefully more effective preventive  medication.  1) cluster migraines 2) morning headaches  3) classic migraines without aura. Status migrainosus.   High risk for OSA due to atrial fib, obesity and poor sleep quality.  Poor diabetes control.  1 gram depakote and 30 mg methylprednisolone. repeat once if only partially effective   My Plan is to proceed with:  1) emgaility to be continued - may be suported by [facility]>   2) refilled triptan. Butalbutal.    3) MRI brain, question of INO- new pupillary abnormality.   I would like to thank Paula Pal, DO and Paula Stout, [address],  [place] [id number] for allowing me to meet with and to take care of this pleasant patient.   In short, Keven Shibley is presenting with cluster and migraine headaches, up to 16 a month ,  symptoms that can be attributed to vascular headaches and DM and OSA.   I plan to follow up either personally or through our NP within 3 month.   CC: I will share my notes with PCP .  Electronically signed by: Paula Seat, MD 11/13/2020 11:09 AM  [facility] Board certified by The [employer] of Sleep Medicine and Diplomate of the [employer] of Sleep Medicine. Board certified In Neurology through the [place], Fellow of the [employer] of Neurology. Medical Director of [facility].

## 2020-11-14 ENCOUNTER — Encounter: Payer: Self-pay | Admitting: Neurology

## 2020-11-17 ENCOUNTER — Other Ambulatory Visit: Payer: Self-pay

## 2020-11-17 ENCOUNTER — Telehealth: Payer: Self-pay | Admitting: Neurology

## 2020-11-17 ENCOUNTER — Encounter: Payer: Self-pay | Admitting: Cardiology

## 2020-11-17 ENCOUNTER — Ambulatory Visit (INDEPENDENT_AMBULATORY_CARE_PROVIDER_SITE_OTHER): Payer: 59 | Admitting: Cardiology

## 2020-11-17 VITALS — BP 132/74 | HR 68 | Ht 66.0 in | Wt 234.1 lb

## 2020-11-17 DIAGNOSIS — E088 Diabetes mellitus due to underlying condition with unspecified complications: Secondary | ICD-10-CM

## 2020-11-17 DIAGNOSIS — E669 Obesity, unspecified: Secondary | ICD-10-CM | POA: Diagnosis not present

## 2020-11-17 DIAGNOSIS — E781 Pure hyperglyceridemia: Secondary | ICD-10-CM

## 2020-11-17 DIAGNOSIS — N183 Chronic kidney disease, stage 3 unspecified: Secondary | ICD-10-CM

## 2020-11-17 DIAGNOSIS — I480 Paroxysmal atrial fibrillation: Secondary | ICD-10-CM

## 2020-11-17 MED ORDER — EPINEPHRINE 0.3 MG/0.3ML IJ SOAJ: 0.3000 mg | INTRAMUSCULAR | 1 refills | Status: DC | PRN

## 2020-11-17 NOTE — Progress Notes (Signed)
Cardiology Office Note:    Date:  11/17/2020   ID:  Paula Stout, DOB 11/21/1955, MRN [id number]  PCP:  [name], DO  Cardiologist:  Jenean Lindau, MD   Referring MD: [name]*    ASSESSMENT:    1. Paroxysmal atrial fibrillation (HCC)   2. Hypertriglyceridemia   3. Obesity (BMI 30-39.9)   4. Diabetes mellitus due to underlying condition with unspecified complications ([place])   5. Stage 3 chronic kidney disease, unspecified whether stage 3a or 3b CKD ([place])    PLAN:    In order of problems listed above:  1. Primary prevention stressed with the patient.  Importance of compliance with diet medication stressed and she vocalized understanding. 2. Paroxysmal atrial fibrillation:I discussed with the patient atrial fibrillation, disease process. Management and therapy including rate and rhythm control, anticoagulation benefits and potential risks were discussed extensively with the patient. Patient had multiple questions which were answered to patient's satisfaction. 3. Narrow complex tachycardia: Paroxysmal: Patient feels much better with flecainide.  I discussed this with her at length.  Her EKG is unremarkable.  Benefits and potential is explained and she vocalized understanding and happy with it. 4. Mixed dyslipidemia diabetes mellitus and morbid obesity: I discussed these 3 issues with her extensively.  Weight reduction was stressed.  Importance of regular exercise was stressed and she promises to do better.  Diet was emphasized for these conditions and patient plans to now embark on an exercise program especially since the weather is getting better. 5. Patient will be seen in follow-up appointment in 6 months or earlier if the patient has any concerns    Medication Adjustments/Labs and Tests Ordered: Current medicines are reviewed at length with the patient today.  Concerns regarding medicines are outlined above.  No orders of the defined types were  placed in this encounter.  No orders of the defined types were placed in this encounter.    No chief complaint on file.    History of Present Illness:    Paula Stout is a 65 y.o. female.  Patient has past medical history of paroxysmal atrial fibrillation, paroxysmal supraventricular tachycardia, essential hypertension, mixed dyslipidemia and diabetes mellitus.  She denies any problems at this time and takes care of activities of daily living.  No chest pain orthopnea or PND.  At the time of my evaluation, the patient is alert awake oriented and in no distress.  Past Medical History:  Diagnosis Date  . A-fib ([place])   . Abscess 02/03/2019  . Acute cystitis without hematuria 05/14/2017   Last Assessment & Plan:  Formatting of this note might be different from the original. - suprapubic tenderness with symptoms of urinary frequency and urgency and subjective fevers (no fevers recorded on any visit). Recently passed a renal stone, US shows left hydronephrosis which is improving.  - UA negative8/24 but pt is clinically symptomatic - will repeat UA  - start pt on Microbid for 5 days  . 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 Anxiety   . Asthma   . At risk for falls 02/04/2019  . Atrial fibrillation ([place]) 09/01/2014   Last Assessment & Plan:  Formatting of this note might be different from the original. A:  Chronic.  Sinus rhythm at this time.  States she was told this during admission at [facility] in the past. P:  On baby aspirin at home will continue.  Low dose metoprolol started.  . Bipolar 1 disorder ([place]) 10/11/2013  .  Bipolar 1 disorder, depressed, moderate ([place]) 02/18/2020  . Bipolar disorder ([place])   . Cellulitis 02/03/2019  . Chest pain 02/19/2020  . Chronic anticoagulation 02/20/2020  . Chronic atrial fibrillation ([place]) 02/18/2020  . Chronic headache 10/11/2013  . Chronic migraine without aura, intractable, without status migrainosus 06/24/2017  . Chronic pain of both shoulders  11/14/2015  . Colon cancer screening 04/15/2016   Last Assessment & Plan:  Formatting of this note might be different from the original. Will schedule for colonoscopy  . Depression   . Depression, recurrent ([place]) 02/18/2020  . Diabetes mellitus ([place])   . Diabetes mellitus due to underlying condition with unspecified complications ([place]) 0000000  . Diabetes mellitus type 2 in obese ([place]) 10/11/2013   Last Assessment & Plan:  Formatting of this note might be different from the original. A:  Diabetes Mellitus Type 2 without long term use of insulin P:  Hold Home metformin. Reg ISS.  . Diabetes mellitus without complication ([place])   . Dyslipidemia 02/20/2020  . GAD (generalized anxiety disorder) 02/18/2020  . Heartburn 04/15/2016   Last Assessment & Plan:  Formatting of this note might be different from the original. Patient was counseled regarding lifestyle modification  Advised to take Prilosec 30 mins before breakfast  Will schedule for EGD  . History of atrial fibrillation 06/24/2017  . History of posttraumatic stress disorder (PTSD) 10/07/2016  . HLD (hyperlipidemia) 09/01/2014   Last Assessment & Plan:  Formatting of this note might be different from the original. A: Chronic. P: Continue statin.  [name] Hypertriglyceridemia 02/18/2020  . Impaired mobility and activities of daily living 11/14/2015  . Irritable bowel syndrome (IBS)   . Left elbow pain 11/14/2015  . Migraines 10/11/2013   Last Assessment & Plan:  Formatting of this note might be different from the original. A: Chronic. P: Continue Topamax. (also takes it for bipolar)  . Morbid obesity ([place]) 12/16/2016  . Multiple sclerosis ([place]) 06/24/2017  . Obesity (BMI 30-39.9) 09/01/2014   Last Assessment & Plan:  Formatting of this note might be different from the original. A: Morbid obesity, with BMI of 35.0 - 39.9 P: Counseled to loose wt.  . Paroxysmal atrial fibrillation ([place]) 04/14/2020  . Renal colic on left side 0000000  . Seizure disorder ([place])  06/24/2017  . Sleep-disordered breathing 06/05/2018  . Stage 3 chronic kidney disease ([place]) 02/20/2020  . Unstable angina ([place]) 09/01/2014   Last Assessment & Plan:  Formatting of this note might be different from the original. A: Pt has 2 months exertional angina, recently developed angina at rest intermittently.  She had a positive nuclear medicine cardiolyte stress test with anteroapical ischemia on stress portion not seen at rest. P:  - continue aspirin, statin, metoprolol - Discussed stress test findings with cardiologist Dr. Landis Gandy    Past Surgical History:  Procedure Laterality Date  . ABDOMINAL HYSTERECTOMY     Fibroids  . ATRIAL FIBRILLATION ABLATION    . CARDIAC CATHETERIZATION    . CESAREAN SECTION    . KNEE SURGERY    . LEFT HEART CATH AND CORONARY ANGIOGRAPHY N/A 03/04/2020   Procedure: LEFT HEART CATH AND CORONARY ANGIOGRAPHY;  Surgeon: Belva Crome, MD;  Location: [facility];  Service: Cardiovascular;  Laterality: N/A;  . ROTATOR CUFF REPAIR      Current Medications: Current Meds  Medication Sig  . albuterol (VENTOLIN HFA) 108 (90 Base) MCG/ACT inhaler Inhale 2 puffs into the lungs every 4 (four) hours as needed for  wheezing.  [name] apixaban (ELIQUIS) 5 MG TABS tablet Take 5 mg by mouth 2 (two) times daily.  [name] atorvastatin (LIPITOR) 80 MG tablet Take 1 tablet (80 mg total) by mouth daily.  [name] buPROPion (WELLBUTRIN XL) 300 MG 24 hr tablet Take 1 tablet (300 mg total) by mouth daily.  . butalbital-acetaminophen-caffeine (FIORICET) 50-325-40 MG tablet Take 1 tablet by mouth every 6 (six) hours as needed for migraine.  . Dulaglutide (TRULICITY) A999333 0000000 SOPN Inject 0.75 mg into the skin once a week.  [name] EPINEPHrine 0.3 mg/0.3 mL IJ SOAJ injection Inject 0.3 mg into the muscle as needed for anaphylaxis.  [name] ezetimibe (ZETIA) 10 MG tablet Take 1 tablet (10 mg total) by mouth daily.  . fenofibrate (TRICOR) 145 MG tablet Take 145 mg by mouth daily.  . flecainide (TAMBOCOR)  50 MG tablet Take 1 tablet (50 mg total) by mouth 2 (two) times daily.  . Galcanezumab-gnlm (EMGALITY) 120 MG/ML SOAJ Inject 120 mg into the skin every 30 (thirty) days.  [name] levocetirizine (XYZAL) 5 MG tablet Take 1 tablet (5 mg total) by mouth every evening.  . Lurasidone HCl 120 MG TABS Take 1 tablet (120 mg total) by mouth at bedtime.  . metFORMIN (GLUCOPHAGE) 1000 MG tablet Take 1,000 mg by mouth 2 (two) times daily with a meal.  . metoprolol succinate (TOPROL-XL) 50 MG 24 hr tablet Take 1 tablet (50 mg total) by mouth daily. Take with or immediately following a meal.  . nitroGLYCERIN (NITROSTAT) 0.4 MG SL tablet Place 0.4 mg under the tongue every 5 (five) minutes as needed for chest pain.  [name] ondansetron (ZOFRAN-ODT) 4 MG disintegrating tablet Take 1 tablet (4 mg total) by mouth every 8 (eight) hours as needed for nausea.  . pantoprazole (PROTONIX) 40 MG tablet Take 40 mg by mouth daily.  . QUEtiapine (SEROQUEL) 300 MG tablet Take 1 tablet (300 mg total) by mouth at bedtime.  . rizatriptan (MAXALT) 10 MG tablet Take 1 tablet (10 mg total) by mouth as needed for migraine. May repeat in 2 hours if needed  . topiramate (TOPAMAX) 50 MG tablet Take 150 mg by mouth 2 (two) times daily.     Allergies:   Bee venom, Onion, Sumatriptan, Asa [aspirin], Nsaids, Iodine, Sulfa antibiotics, and Vancomycin   Social History   Socioeconomic History  . Marital status: Widowed    Spouse name: Not on file  . Number of children: 6  . Years of education: Not on file  . Highest education level: Not on file  Occupational History  . Not on file  Tobacco Use  . Smoking status: Never Smoker  . Smokeless tobacco: Never Used  Vaping Use  . Vaping Use: Never used  Substance and Sexual Activity  . Alcohol use: Never  . Drug use: Never  . Sexual activity: Not on file  Other Topics Concern  . Not on file  Social History Narrative  . Not on file   Social Determinants of Health   Financial Resource Strain:  Not on file  Food Insecurity: Not on file  Transportation Needs: Not on file  Physical Activity: Not on file  Stress: Not on file  Social Connections: Not on file     Family History: The patient's family history includes Bipolar disorder in her daughter and son. She was adopted.  ROS:   Please see the history of present illness.    All other systems reviewed and are negative.  EKGs/Labs/Other Studies Reviewed:    The  following studies were reviewed today: EKG reveals sinus rhythm nonspecific ST-T changes and QRS is unremarkable.   Recent Labs: 10/13/2020: ALT 28; BUN 16; Creatinine, Ser 1.81; Potassium 4.4; Sodium 140 10/20/2020: Hemoglobin 11.9; Platelets 376.0  Recent Lipid Panel    Component Value Date/Time   CHOL 175 10/13/2020 0856   TRIG 197.0 (H) 10/13/2020 0856   HDL 31.60 (L) 10/13/2020 0856   CHOLHDL 6 10/13/2020 0856   VLDL 39.4 10/13/2020 0856   LDLCALC 104 (H) 10/13/2020 0856    Physical Exam:    VS:  BP 132/74   Pulse 68   Ht '5\' 6"'$  (1.676 m)   Wt 234 lb 1.3 oz (106.2 kg)   SpO2 96%   BMI 37.78 kg/m     Wt Readings from Last 3 Encounters:  11/17/20 234 lb 1.3 oz (106.2 kg)  11/13/20 230 lb (104.3 kg)  11/03/20 232 lb 1.3 oz (105.3 kg)     GEN: Patient is in no acute distress HEENT: Normal NECK: No JVD; No carotid bruits LYMPHATICS: No lymphadenopathy CARDIAC: Hear sounds regular, 2/6 systolic murmur at the apex. RESPIRATORY:  Clear to auscultation without rales, wheezing or rhonchi  ABDOMEN: Soft, non-tender, non-distended MUSCULOSKELETAL:  No edema; No deformity  SKIN: Warm and dry NEUROLOGIC:  Alert and oriented x 3 PSYCHIATRIC:  Normal affect   Signed, Jenean Lindau, MD  11/17/2020 2:32 PM    [facility]

## 2020-11-17 NOTE — Telephone Encounter (Signed)
[employer] auth: [id number] (exp. 11/17/20 to 12/16/20) patient is scheduled at GI for 12/02/20.

## 2020-11-17 NOTE — Patient Instructions (Signed)

## 2020-11-24 ENCOUNTER — Other Ambulatory Visit: Payer: 59

## 2020-11-26 ENCOUNTER — Ambulatory Visit (HOSPITAL_COMMUNITY): Payer: 59 | Admitting: Licensed Clinical Social Worker

## 2020-11-27 ENCOUNTER — Other Ambulatory Visit: Payer: Self-pay

## 2020-11-27 ENCOUNTER — Ambulatory Visit (INDEPENDENT_AMBULATORY_CARE_PROVIDER_SITE_OTHER): Payer: 59 | Admitting: Licensed Clinical Social Worker

## 2020-11-27 DIAGNOSIS — F3130 Bipolar disorder, current episode depressed, mild or moderate severity, unspecified: Secondary | ICD-10-CM

## 2020-11-27 DIAGNOSIS — F411 Generalized anxiety disorder: Secondary | ICD-10-CM

## 2020-11-27 NOTE — Progress Notes (Signed)
THERAPIST PROGRESS NOTE   Session Time: 10:00am - 11:00am  Location: Patient: [facility] [place] Office Provider: [facility] [place] [facility]    Participation Level: Active   Behavioral Response: Alert, casually dressed, anxious affect/mood   Type of Therapy:  Individual Therapy   Treatment Goals addressed: Medication compliance; Anxiety and depression management    Interventions: CBT, problem solving, communication skills   Summary: Paula Stout is a 65 year old widowed Caucasian female that presented for therapy session today with diagnoses of Generalized anxiety disorder; and Bipolar I Disorder, most recent episode depressed.     Suicidal/Homicidal: None; without plan or intent.    Therapist Response: Clinician met with Paula Stout, who prefers to go by "[name]" for in-person session and assessed for safety, medication compliance, and sobriety.  [name] presented to appointment on time and was alert, oriented x5, with no evidence or self-report of active SI/HI or A/V H.  [name] reported that she continues taking medication responsibly and denied use of alcohol or illicit substances.  Clinician inquired about [name]'s current emotional ratings, as well as any significant changes in thoughts, feelings, or behavior since previous check-in.  [name] reported scores of 5/10 for depression, 8/10 for anxiety, 4/10 for anger/irritability, and noted one angry outburst since last session.  [name] reported that this was triggered when her daughter complained about her spending all her time in her bedroom alone, and pushed her to the point where she felt compelled to express herself, although it was in an uncontrolled manner.  [name] stated "I did yell and raise my voice, but she told me I didn't understand where she was coming from at all".  Clinician utilized a handout on Armed forces logistics/support/administrative officer with [name] today to assist her with addressing ongoing issues with her daughter in the future while reducing chance of argument ensuing,  providing suggestions such as choosing a calm moment, using appropriate body language and tone of voice, using "I" statements to express feelings and increase empathy, focusing on the problem at hand to avoid getting off track, and being respectful and understanding towards other party.  Intervention was effective, as evidenced by [name] reporting that this helped quell some of her anxiety today and prepared her to speak with her daughter again about recent conflict to resolve misunderstanding, stating "I want to get along with her, and this gave me some helpful strategies on how to approach what's going on".  Clinician will continue to monitor.      Plan: Follow up again in 2 weeks.    Diagnosis: Generalized anxiety disorder; and Bipolar I Disorder, most recent episode depressed   [name], [place], LCAS 11/27/20

## 2020-12-02 ENCOUNTER — Ambulatory Visit
Admission: RE | Admit: 2020-12-02 | Discharge: 2020-12-02 | Disposition: A | Discharge status: Home / Self Care | Payer: 59 | Source: Ambulatory Visit | Attending: Neurology | Admitting: Neurology

## 2020-12-02 ENCOUNTER — Other Ambulatory Visit: Payer: Self-pay

## 2020-12-02 DIAGNOSIS — F411 Generalized anxiety disorder: Secondary | ICD-10-CM

## 2020-12-02 DIAGNOSIS — G43011 Migraine without aura, intractable, with status migrainosus: Secondary | ICD-10-CM

## 2020-12-02 DIAGNOSIS — I482 Chronic atrial fibrillation, unspecified: Secondary | ICD-10-CM

## 2020-12-02 DIAGNOSIS — G43711 Chronic migraine without aura, intractable, with status migrainosus: Secondary | ICD-10-CM

## 2020-12-02 MED ORDER — GADOBENATE DIMEGLUMINE 529 MG/ML IV SOLN
20.0000 mL | Freq: Once | INTRAVENOUS | Status: AC | PRN
  Administered 2020-12-02: 20 mL via INTRAVENOUS

## 2020-12-03 NOTE — Progress Notes (Signed)
65 year old woman with migraine COMPARISON FILMS: 04/22/2020 After the infusion of contrast material, a normal enhancement pattern is noted.  The MRI is unchanged compared to the 04/22/2020 study.  IMPRESSION: This is a normal age-appropriate MRI of the brain with and without contrast. No evidence of inflammation, white matter disease, atrophy or sinustis.

## 2020-12-04 ENCOUNTER — Encounter: Payer: Self-pay | Admitting: *Deleted

## 2020-12-04 MED ORDER — UBRELVY 100 MG PO TABS: 100.0000 mg | ORAL_TABLET | ORAL | 3 refills | Status: DC | PRN

## 2020-12-04 NOTE — Telephone Encounter (Signed)
These newer medications often need pre-approval from insurance  and the patient has to have failed several other preventive or acute migraine meds. I have seen maxalt , TPM,  And Zofran for nausea,   I have no hesitation to order a new medication, but it may not be in the patient's hands by Monday !   I wrote for Ubrelvy. 100 mg to [place]

## 2020-12-08 ENCOUNTER — Telehealth: Payer: Self-pay | Admitting: Neurology

## 2020-12-08 NOTE — Telephone Encounter (Signed)
PA completed on cover my meds KEY: [id number] The request has been approved. The authorization is effective for a maximum of 6 fills from 12/08/2020 to 06/08/2021,

## 2020-12-15 NOTE — Progress Notes (Signed)
Pt had an EKG and it was reviewed.

## 2020-12-16 ENCOUNTER — Other Ambulatory Visit: Payer: Self-pay

## 2020-12-16 ENCOUNTER — Ambulatory Visit (INDEPENDENT_AMBULATORY_CARE_PROVIDER_SITE_OTHER): Payer: 59 | Admitting: Licensed Clinical Social Worker

## 2020-12-16 ENCOUNTER — Telehealth (HOSPITAL_COMMUNITY): Payer: 59 | Admitting: Family

## 2020-12-16 DIAGNOSIS — F411 Generalized anxiety disorder: Secondary | ICD-10-CM

## 2020-12-16 DIAGNOSIS — F3130 Bipolar disorder, current episode depressed, mild or moderate severity, unspecified: Secondary | ICD-10-CM | POA: Diagnosis not present

## 2020-12-16 NOTE — Progress Notes (Signed)
THERAPIST PROGRESS NOTE   Session Time: 11:00am - 12:00pm  Location: Patient: [facility] [place] [facility] Provider: [facility] [place] [facility]    Participation Level: Active   Behavioral Response: Alert, casually dressed, anxious affect/mood   Type of Therapy:  Individual Therapy   Treatment Goals addressed: Medication compliance; Anxiety and depression management    Interventions: CBT: self-care activities    Summary: Paula Stout is a 65 year old widowed Caucasian female that presented for therapy session today with diagnoses of Generalized anxiety disorder; and Bipolar I Disorder, most recent episode depressed.     Suicidal/Homicidal: None; without plan or intent.    Therapist Response: Clinician met with Paula Stout, who prefers to go by "Paula Stout" for in-person appointment and assessed for safety, medication compliance, and sobriety.  Paula Stout presented for session on time and was alert, oriented x5, with no evidence or self-report of active SI/HI or A/V H.  Paula Stout reported ongoing compliance with medication and denied use of alcohol or illicit substances.  Clinician inquired about [name]'s emotional ratings today, as well as any significant changes in thoughts, feelings, or behavior since last check-in.  Paula Stout reported scores of 8/10 for depression, 8/10 for anxiety, 6/10 for anger/irritability, and denied any panic attacks or outbursts since past session.  Paula Stout reported that she spent a nice time out of state for a week house sitting for a friend, which offered her some much needed socialization and a chance to do some sewing as well.  Paula Stout reported that once she returned home she found herself crying though, stating "I talked with my daughter several times while I was away and she said she was busy cleaning, but when I got back the smell was so bad I was coughing and gagging.  I don't see what she actually did during that time".  Paula Stout reported that she did address this issue assertively with her daughter, but  it seemed like there were excuses for everything.  Paula Stout reported that she is still planning to move to [place] to live with her son in mid-June, but in the meantime she feels like she is trapped at home and miserable.  Clinician recommended that Paula Stout consider engaging in more self-care activities throughout each day to distract herself during this time since her daughter appears unwilling or motivated to make accommodations, and went through a list of 50 different self-care activities that might interest her, including ones which could get her out of the household more often.  Intervention was effective, as evidenced by Paula Stout reporting that several of these were appealing to her and could provide healthy escape from current situation, including visiting [employer], signing up for water aerobics courses, or beginning to write again, as she noted she did this 20 years ago and had some financial success.  Paula Stout stated "This was definitely a good use of time.  It made me think of things I could do to get outside of house and not focus so much on things I can't immediately change".  Clinician will continue to monitor.      Plan: Follow up again in 2 weeks.    Diagnosis: Generalized anxiety disorder; and Bipolar I Disorder, most recent episode depressed   [name], [place], LCAS 12/16/20

## 2020-12-30 ENCOUNTER — Ambulatory Visit (HOSPITAL_COMMUNITY): Payer: Self-pay | Admitting: Licensed Clinical Social Worker

## 2021-01-05 ENCOUNTER — Other Ambulatory Visit: Payer: Self-pay

## 2021-01-05 ENCOUNTER — Encounter (HOSPITAL_COMMUNITY): Payer: Self-pay | Admitting: Psychiatry

## 2021-01-05 ENCOUNTER — Telehealth (INDEPENDENT_AMBULATORY_CARE_PROVIDER_SITE_OTHER): Payer: 59 | Admitting: Psychiatry

## 2021-01-05 DIAGNOSIS — F411 Generalized anxiety disorder: Secondary | ICD-10-CM

## 2021-01-05 DIAGNOSIS — F3132 Bipolar disorder, current episode depressed, moderate: Secondary | ICD-10-CM

## 2021-01-05 NOTE — Progress Notes (Signed)
[facility] MD/PA/NP OP Progress Note  01/05/2021 12:59 PM Paula Stout  MRN:  [id number] Interview was conducted using videoconferencing application and I verified that I was speaking with the correct person using two identifiers. I discussed the limitations of evaluation and management by telemedicine and  the availability of in person appointments. Patient expressed understanding and agreed to proceed. Participants in the visit: patient (location - home); physician (location - home office).  Chief Complaint:  stress from living situation.  HPI: 65yo widowed female with bipolar disorder and generalized anxiety disorder. Patient was previously followed by Dr.Pucilowska. He is no longer with [facility]. She reports doing ok, most recently had 2 manic episodes but was able to manage. She is seeing a therapist every 2 weeks which helps. Some issues with daughter who she lives with so planning to move with her son who lives in [place]. Compliant with her medications and denies any side effects. Denies any suicidal thoughts.    Per Dr.Pucilowska, per his last note with patient, "She had been followed by Dr. Lilia Pro a psychiatrist in [facility] who had her on Latuda 120 mg, Seroquel 300 mg (initiated for insomnia), Wellbutrin XL 300 mg daily. She lost her husband last year and on November 1st moved to [place] to stay with her oldest daughter. Her insurance changed and approval for [name] was initially denied. Prior to [place] she tried unsucessfully  Lamictal, Abilify, Geodon (these are ones she remembers). Mood has improved, she is not suicidal, hopeless, there are no psychotic symptoms present. She continued to struggle with insomnia both initial and middle so we have tried zolpidem CR - it proved to be quite helpful. While in [place] she was in counseling on monthly basis and she would like to have a therapist here as well. She has type 2 DM and hyperlipidemia - labs are monitored by her PCP. Her son wants her to move  to [place] where he lives - her daughter does not - this causes stress (she would rather not move).     Visit Diagnosis:    ICD-10-CM   1. Bipolar 1 disorder, depressed, moderate ([place])  F31.32   2. GAD (generalized anxiety disorder)  F41.1     Past Psychiatric History: Please see intake H&P.  Past Medical History:  Past Medical History:  Diagnosis Date   A-fib ([place])    Abscess 02/03/2019   Acute cystitis without hematuria 05/14/2017   Last Assessment & Plan:  Formatting of this note might be different from the original. - suprapubic tenderness with symptoms of urinary frequency and urgency and subjective fevers (no fevers recorded on any visit). Recently passed a renal stone, US shows left hydronephrosis which is improving.  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 A:  Chronic.  Sinus rhythm at this time.  States she was told this during admission at [facility] in the past. P:  On baby aspirin at home will continue.  Low dose metoprolol started.   Bipolar 1 disorder ([place]) 10/11/2013   Bipolar 1 disorder, depressed, moderate ([place]) 02/18/2020   Bipolar disorder ([place])    Cellulitis 02/03/2019   Chest pain 02/19/2020  Chronic anticoagulation 02/20/2020   Chronic atrial fibrillation (HCC) 02/18/2020   Chronic headache 10/11/2013   Chronic migraine without aura, intractable, without status migrainosus 06/24/2017   Chronic pain of both shoulders 11/14/2015   Colon cancer screening 04/15/2016   Last Assessment & Plan:  Formatting of this note might be different from the original. Will schedule for colonoscopy   Depression    Depression, recurrent ([place]) 02/18/2020   Diabetes mellitus ([place])    Diabetes mellitus due to  underlying condition with unspecified complications ([place]) 0000000   Diabetes mellitus type 2 in obese ([place]) 10/11/2013   Last Assessment & Plan:  Formatting of this note might be different from the original. A:  Diabetes Mellitus Type 2 without long term use of insulin P:  Hold Home metformin. Reg ISS.   Diabetes mellitus without complication ([place])    Dyslipidemia 02/20/2020   GAD (generalized anxiety disorder) 02/18/2020   Heartburn 04/15/2016   Last Assessment & Plan:  Formatting of this note might be different from the original. Patient was counseled regarding lifestyle modification  Advised to take Prilosec 30 mins before breakfast  Will schedule for EGD   History of atrial fibrillation 06/24/2017   History of posttraumatic stress disorder (PTSD) 10/07/2016   HLD (hyperlipidemia) 09/01/2014   Last Assessment & Plan:  Formatting of this note might be different from the original. A: Chronic. P: Continue statin.   Hypertriglyceridemia 02/18/2020   Impaired mobility and activities of daily living 11/14/2015   Irritable bowel syndrome (IBS)    Left elbow pain 11/14/2015   Migraines 10/11/2013   Last Assessment & Plan:  Formatting of this note might be different from the original. A: Chronic. P: Continue Topamax. (also takes it for bipolar)   Morbid obesity ([place]) 12/16/2016   Multiple sclerosis ([place]) 06/24/2017   Obesity (BMI 30-39.9) 09/01/2014   Last Assessment & Plan:  Formatting of this note might be different from the original. A: Morbid obesity, with BMI of 35.0 - 39.9 P: Counseled to loose wt.   Paroxysmal atrial fibrillation ([place]) 0000000   Renal colic on left side 0000000   Seizure disorder ([place]) 06/24/2017   Sleep-disordered breathing 06/05/2018   Stage 3 chronic kidney disease ([place]) 02/20/2020   Unstable angina ([place]) 09/01/2014   Last Assessment & Plan:  Formatting of this note might be different from the original. A: Pt has 2 months exertional angina, recently developed angina at rest  intermittently.  She had a positive nuclear medicine cardiolyte stress test with anteroapical ischemia on stress portion not seen at rest. P:  - continue aspirin, statin, metoprolol - Discussed stress test findings with cardiologist Dr. Landis Gandy    Past Surgical History:  Procedure Laterality Date   ABDOMINAL HYSTERECTOMY     Fibroids   ATRIAL [place]     LEFT HEART CATH AND CORONARY ANGIOGRAPHY N/A 03/04/2020   Procedure: LEFT HEART CATH AND CORONARY ANGIOGRAPHY;  Surgeon: Belva Crome, MD;  Location: [facility];  Service: Cardiovascular;  Laterality: N/A;   ROTATOR CUFF REPAIR      Family Psychiatric History: Reviewed.  Family History:  Family History  Adopted: Yes  Problem Relation Age of Onset   Bipolar disorder Son    Bipolar disorder Daughter     Social History:  Social History   Socioeconomic History   Marital status: Widowed    Spouse name: Not  on file   Number of children: 6   Years of education: Not on file   Highest education level: Not on file  Occupational History   Not on file  Tobacco Use   Smoking status: Never Smoker   Smokeless tobacco: Never Used  Vaping Use   Vaping Use: Never used  Substance and Sexual Activity   Alcohol use: Never   Drug use: Never   Sexual activity: Not on file  Other Topics Concern   Not on file  Social History Narrative   Not on file   Social Determinants of Health   Financial Resource Strain: Not on file  Food Insecurity: Not on file  Transportation Needs: Not on file  Physical Activity: Not on file  Stress: Not on file  Social Connections: Not on file    Allergies:  Allergies  Allergen Reactions   Bee Venom Anaphylaxis   Onion     Other reaction(s): Respiratory Distress   Sumatriptan Other (See Comments)    Other reaction(s): UNKNOWN Passed out, nose bleed    Asa [Aspirin] Nausea And Vomiting   Nsaids Nausea And  Vomiting   Iodine Rash   Sulfa Antibiotics Rash   Vancomycin Rash    Metabolic Disorder Labs: Lab Results  Component Value Date   HGBA1C 8.1 (H) 05/20/2020   MPG 186 05/20/2020   MPG 163 02/20/2020   No results found for: PROLACTIN Lab Results  Component Value Date   CHOL 175 10/13/2020   TRIG 197.0 (H) 10/13/2020   HDL 31.60 (L) 10/13/2020   CHOLHDL 6 10/13/2020   VLDL 39.4 10/13/2020   LDLCALC 104 (H) 10/13/2020   LDLCALC NOT CALCULATED 02/20/2020   No results found for: TSH  Therapeutic Level Labs: No results found for: LITHIUM No results found for: VALPROATE No components found for:  CBMZ  Current Medications: Current Outpatient Medications  Medication Sig Dispense Refill   albuterol (VENTOLIN HFA) 108 (90 Base) MCG/ACT inhaler Inhale 2 puffs into the lungs every 4 (four) hours as needed for wheezing. 1 each 2   apixaban (ELIQUIS) 5 MG TABS tablet Take 5 mg by mouth 2 (two) times daily.     atorvastatin (LIPITOR) 80 MG tablet Take 1 tablet (80 mg total) by mouth daily. 90 tablet 3   buPROPion (WELLBUTRIN XL) 300 MG 24 hr tablet Take 1 tablet (300 mg total) by mouth daily. 90 tablet 2   butalbital-acetaminophen-caffeine (FIORICET) 50-325-40 MG tablet Take 1 tablet by mouth every 6 (six) hours as needed for migraine. 21 tablet 2   Dulaglutide (TRULICITY) [id number] 0000000 SOPN Inject 0.75 mg into the skin once a week. 2 mL 5   EPINEPHrine 0.3 mg/0.3 mL IJ SOAJ injection Inject 0.3 mg into the muscle as needed for anaphylaxis. 1 each 1   ezetimibe (ZETIA) 10 MG tablet Take 1 tablet (10 mg total) by mouth daily. 30 tablet 3   fenofibrate (TRICOR) 145 MG tablet Take 145 mg by mouth daily.     flecainide (TAMBOCOR) 50 MG tablet Take 1 tablet (50 mg total) by mouth 2 (two) times daily. 90 tablet 6   Galcanezumab-gnlm (EMGALITY) 120 MG/ML SOAJ Inject 120 mg into the skin every 30 (thirty) days.     levocetirizine (XYZAL) 5 MG tablet Take 1 tablet (5 mg total) by mouth every  evening. 30 tablet 2   Lurasidone HCl 120 MG TABS Take 1 tablet (120 mg total) by mouth at bedtime. 90 tablet 1   metFORMIN (GLUCOPHAGE) 1000 MG tablet  Take 1,000 mg by mouth 2 (two) times daily with a meal.     metoprolol succinate (TOPROL-XL) 50 MG 24 hr tablet Take 1 tablet (50 mg total) by mouth daily. Take with or immediately following a meal. 90 tablet 3   nitroGLYCERIN (NITROSTAT) 0.4 MG SL tablet Place 0.4 mg under the tongue every 5 (five) minutes as needed for chest pain.     ondansetron (ZOFRAN-ODT) 4 MG disintegrating tablet Take 1 tablet (4 mg total) by mouth every 8 (eight) hours as needed for nausea. 20 tablet 2   pantoprazole (PROTONIX) 40 MG tablet Take 40 mg by mouth daily.     QUEtiapine (SEROQUEL) 300 MG tablet Take 1 tablet (300 mg total) by mouth at bedtime. 90 tablet 2   rizatriptan (MAXALT) 10 MG tablet Take 1 tablet (10 mg total) by mouth as needed for migraine. May repeat in 2 hours if needed 10 tablet 0   topiramate (TOPAMAX) 50 MG tablet Take 150 mg by mouth 2 (two) times daily.     Ubrogepant (UBRELVY) 100 MG TABS Take 100 mg by mouth as needed. 10 tablet 3   No current facility-administered medications for this visit.    Psychiatric Specialty Exam: Review of Systems  Psychiatric/Behavioral: The patient is nervous/anxious.   All other systems reviewed and are negative.   There were no vitals taken for this visit.There is no height or weight on file to calculate BMI.  General Appearance: Casual  Eye Contact:  Good  Speech:  Clear and Coherent and Normal Rate  Volume:  Normal  Mood:  Anxious  Affect:  Full Range  Thought Process:  Goal Directed  Orientation:  Full (Time, Place, and Person)  Thought Content: Logical   Suicidal Thoughts:  No  Homicidal Thoughts:  No  Memory:  Immediate;   Good Recent;   Good Remote;   Good  Judgement:  Good  Insight:  Good  Psychomotor Activity:  Normal  Concentration:  Concentration: Good  Recall:  Good  Fund of  Knowledge: Good  Language: Good  Akathisia:  Negative  Handed:  Right  AIMS (if indicated): not done  Assets:  Communication Skills Desire for Improvement Financial Resources/Insurance Housing Resilience  ADL's:  Intact  Cognition: WNL  Sleep:  Good   Screenings: GAD-7    Flowsheet Row Counselor from 10/14/2020 in [facility]  Total GAD-7 Score 12      PHQ2-9    Flowsheet Row Counselor from 10/14/2020 in [facility] Visit from 05/20/2020 in [facility] at [facility]  PHQ-2 Total Score 6 0  PHQ-9 Total Score 19 0      Flowsheet Row Counselor from 10/14/2020 in [facility] No Risk        Assessment and Plan: *65 yo widowed female with bipolar disorder and generalized anxiety disorder. She had been followed by Dr. Lilia Pro a psychiatrist in [facility] who had her on Latuda 120 mg, Seroquel 300 mg (initiated for insomnia), Wellbutrin XL 300 mg daily. She lost her husband last year and on November 1st moved to [place] to stay with her oldest daughter. Her insurance changed and approval for [name] was initially denied. Prior to [place] she tried unsucessfully  Lamictal, Abilify, Geodon (these are ones she remembers). Mood has improved, she is not suicidal, hopeless, there are no psychotic symptoms present. She continued to struggle with insomnia both initial and middle so  we have tried zolpidem CR - it proved to be quite helpful. While in [place] she was in counseling on monthly basis and she would like to have a therapist here as well. She has type 2 DM and hyperlipidemia - labs are monitored by her PCP. Her son wants her to move to [place] where he lives - her daughter does not - this causes stress (she would rather not move).   Dx: Bipolar 1 depressed, GAD   Plan: We will continue current meds: Wellbutrin XL 300 mg, Seroquel 300 mg  at HS, Latuda 120 mg. Discontinue zolpidem CR 6.25 mg(patient has been having sleep walking) . Per Dr.P, Seroquel is primarily for insomnia/anxiety and did not prove to be sufficient without Latuda for mood control.  She is seeing a therapist biweekly  Next visit with in 3 months.. The plan was discussed with patient who had an opportunity to ask questions and these were all answered. I spend 15 minutes in videoconferencing  with the patient.  I spent 20 minutes in chart review.   Elvin So, MD 01/05/2021, 12:59 PM

## 2021-01-08 ENCOUNTER — Ambulatory Visit: Payer: 59 | Admitting: Cardiology

## 2021-01-12 ENCOUNTER — Other Ambulatory Visit: Payer: Self-pay

## 2021-01-12 ENCOUNTER — Ambulatory Visit (INDEPENDENT_AMBULATORY_CARE_PROVIDER_SITE_OTHER): Payer: 59 | Admitting: Licensed Clinical Social Worker

## 2021-01-12 DIAGNOSIS — F3130 Bipolar disorder, current episode depressed, mild or moderate severity, unspecified: Secondary | ICD-10-CM

## 2021-01-12 DIAGNOSIS — F411 Generalized anxiety disorder: Secondary | ICD-10-CM

## 2021-01-12 NOTE — Progress Notes (Signed)
THERAPIST PROGRESS NOTE   Session Time: 2:00pm - 3:00pm   Location: Patient: [facility] [place] [facility] Provider: [facility] [place] [facility]    Participation Level: Active   Behavioral Response: Alert, casually dressed, anxious affect/mood   Type of Therapy:  Individual Therapy   Treatment Goals addressed: Medication compliance; Anxiety and depression management    Interventions: CBT, problem solving     Summary: Paula Stout is a 65 year old widowed Caucasian female that presented for therapy session today with diagnoses of Generalized anxiety disorder; and Bipolar I Disorder, most recent episode depressed.     Suicidal/Homicidal: None; without plan or intent.    Therapist Response: Clinician met with Paula Stout, who prefers to go by "Paula Stout" for in-person session and assessed for safety, medication compliance, and sobriety.  Paula Stout presented for appointment on time and was alert, oriented x5, with no evidence or self-report of active SI/HI or A/V H.  Paula Stout reported that she continues taking medication responsibly and denied use of alcohol or illicit substances.  Clinician inquired about Paula Stout's current emotional ratings, as well as any significant changes in thoughts, feelings, or behavior since previous check-in.  Paula Stout reported scores of 3/10 for depression, 6/10 for anxiety, 0/10 for anger/irritability, and denied any recent panic attacks or outbursts.  Paula Stout reported that she just returned from [place] after her son offered to let her visit for 2 weeks with his family.  She stated "It was a lot of fun, I spent a lot of time with my grandson and went to the [employer] and a water park".  It was really hard to come back though".  Paula Stout reported that this trip offered plenty of self-care and her depression and anxiety ratings were virtually zero while she was away, but upon returning back to her daughter's home, she has noticed symptoms increasing again, and is anxious about tasks she needs to take care  of to before moving permanently to [place] in mid-June.  Clinician assisted Paula Stout in identifying specific goals she needs to accomplish before this transition, breaking them down into smaller, more manageable tasks, prioritizing them in terms of importance, and then integrating them into schedule for upcoming weeks to increase chance of follow-through and reduce related anxieties.  Intervention was effective, as evidenced by Paula Stout's engagement in this activity and positive response, noting that she will first need to prepare her car by paying for registration and getting it set up for a U-haul trailer.  She reported that she would then focus upon outreaching insurance about coverage in [place], get a 30 day refill of medication, and begin search for a new psychiatrist and therapist.  Paula Stout reported that her last goal will be to go through belongings, determine what she intends to keep or donate, and organize these into boxes.  Paula Stout stated "Its still pretty intimidating, but now I have a plan and steps in mind to get this all done".  Clinician will continue to monitor.      Plan: Follow up again in 2 weeks.    Diagnosis: Generalized anxiety disorder; and Bipolar I Disorder, most recent episode depressed   [name], [place], LCAS 01/12/21

## 2021-01-19 ENCOUNTER — Other Ambulatory Visit: Payer: Self-pay

## 2021-01-19 ENCOUNTER — Encounter: Payer: Self-pay | Admitting: Cardiology

## 2021-01-19 ENCOUNTER — Ambulatory Visit (INDEPENDENT_AMBULATORY_CARE_PROVIDER_SITE_OTHER): Payer: 59 | Admitting: Cardiology

## 2021-01-19 VITALS — BP 132/80 | HR 66 | Ht 66.0 in | Wt 230.0 lb

## 2021-01-19 DIAGNOSIS — I480 Paroxysmal atrial fibrillation: Secondary | ICD-10-CM

## 2021-01-19 MED ORDER — ISOSORBIDE MONONITRATE ER 30 MG PO TB24: 15.0000 mg | ORAL_TABLET | Freq: Every day | ORAL | 6 refills | Status: DC

## 2021-01-19 NOTE — Progress Notes (Signed)
Electrophysiology Office Note   Date:  01/19/2021   ID:  Aimen Leeb, DOB 12-Sep-1955, MRN [id number]  PCP:  [name], DO  Cardiologist: Revankar Primary Electrophysiologist:  Rahmon Heigl [name], MD    Chief Complaint: Atrial fibrillation   History of Present Illness: Paula Stout is a 65 y.o. female who is being seen today for the evaluation of atrial fibrillation at the request of [name]*. Presenting today for electrophysiology evaluation.  She has a history of paroxysmal atrial fibrillation, type 2 diabetes, hyperlipidemia, CKD stage III.  She is status post A. fib ablation with CTI and Page 09/27/2018.  She also has SVT and is currently on metoprolol and diltiazem.  She presented to [facility] June 2021 with palpitations and chest pain.  She was found to be in sinus rhythm on presentation.  Catheterization showed no evidence of coronary artery disease.  Today, denies symptoms of palpitations, shortness of breath, orthopnea, PND, lower extremity edema, claudication, dizziness, presyncope, syncope, bleeding, or neurologic sequela. The patient is tolerating medications without difficulties.  Since being seen, unfortunately her chest pain has continued.  She had a Myoview that showed no evidence of ischemia.  Her pain is worse when she exerts herself, but also occurs when she is at rest.  It occurs on a daily basis.  She has taken nitroglycerin in the past with mild improvement in her symptoms.  Past Medical History:  Diagnosis Date  . A-fib ([place])   . Abscess 02/03/2019  . Acute cystitis without hematuria 05/14/2017   Last Assessment & Plan:  Formatting of this note might be different from the original. - suprapubic tenderness with symptoms of urinary frequency and urgency and subjective fevers (no fevers recorded on any visit). Recently passed a renal stone, US shows left hydronephrosis which is improving.  - UA negative8/24 but pt is  clinically symptomatic - Jamina Macbeth repeat UA  - start pt on Microbid for 5 days  . 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 Anxiety   . Asthma   . At risk for falls 02/04/2019  . Atrial fibrillation ([place]) 09/01/2014   Last Assessment & Plan:  Formatting of this note might be different from the original. A:  Chronic.  Sinus rhythm at this time.  States she was told this during admission at [facility] in the past. P:  On baby aspirin at home Basilio Meadow continue.  Low dose metoprolol started.  . Bipolar 1 disorder ([place]) 10/11/2013  . Bipolar 1 disorder, depressed, moderate ([place]) 02/18/2020  . Bipolar disorder ([place])   . Cellulitis 02/03/2019  . Chest pain 02/19/2020  . Chronic anticoagulation 02/20/2020  . Chronic atrial fibrillation ([place]) 02/18/2020  . Chronic headache 10/11/2013  . Chronic migraine without aura, intractable, without status migrainosus 06/24/2017  . Chronic pain of both shoulders 11/14/2015  . Colon cancer screening 04/15/2016   Last Assessment & Plan:  Formatting of this note might be different from the original. Delmi Fulfer schedule for colonoscopy  . Depression   . Depression, recurrent ([place]) 02/18/2020  . Diabetes mellitus ([place])   . Diabetes mellitus due to underlying condition with unspecified complications ([place]) 0000000  . Diabetes mellitus type 2 in obese ([place]) 10/11/2013   Last Assessment & Plan:  Formatting of this note might be different from the original. A:  Diabetes Mellitus Type 2 without long term use of insulin P:  Hold Home metformin. Reg ISS.  . Diabetes mellitus without complication ([place])   . Dyslipidemia 02/20/2020  .  GAD (generalized anxiety disorder) 02/18/2020  . Heartburn 04/15/2016   Last Assessment & Plan:  Formatting of this note might be different from the original. Patient was counseled regarding lifestyle modification  Advised to take Prilosec 30 mins before breakfast  Addyson Traub schedule for EGD  . History of atrial fibrillation 06/24/2017  . History of posttraumatic stress  disorder (PTSD) 10/07/2016  . HLD (hyperlipidemia) 09/01/2014   Last Assessment & Plan:  Formatting of this note might be different from the original. A: Chronic. P: Continue statin.  [name] Hypertriglyceridemia 02/18/2020  . Impaired mobility and activities of daily living 11/14/2015  . Irritable bowel syndrome (IBS)   . Left elbow pain 11/14/2015  . Migraines 10/11/2013   Last Assessment & Plan:  Formatting of this note might be different from the original. A: Chronic. P: Continue Topamax. (also takes it for bipolar)  . Morbid obesity ([place]) 12/16/2016  . Multiple sclerosis ([place]) 06/24/2017  . Obesity (BMI 30-39.9) 09/01/2014   Last Assessment & Plan:  Formatting of this note might be different from the original. A: Morbid obesity, with BMI of 35.0 - 39.9 P: Counseled to loose wt.  . Paroxysmal atrial fibrillation ([place]) 04/14/2020  . Renal colic on left side 0000000  . Seizure disorder ([place]) 06/24/2017  . Sleep-disordered breathing 06/05/2018  . Stage 3 chronic kidney disease ([place]) 02/20/2020  . Unstable angina ([place]) 09/01/2014   Last Assessment & Plan:  Formatting of this note might be different from the original. A: Pt has 2 months exertional angina, recently developed angina at rest intermittently.  She had a positive nuclear medicine cardiolyte stress test with anteroapical ischemia on stress portion not seen at rest. P:  - continue aspirin, statin, metoprolol - Discussed stress test findings with cardiologist Dr. Landis Gandy   Past Surgical History:  Procedure Laterality Date  . ABDOMINAL HYSTERECTOMY     Fibroids  . ATRIAL FIBRILLATION ABLATION    . CARDIAC CATHETERIZATION    . CESAREAN SECTION    . KNEE SURGERY    . LEFT HEART CATH AND CORONARY ANGIOGRAPHY N/A 03/04/2020   Procedure: LEFT HEART CATH AND CORONARY ANGIOGRAPHY;  Surgeon: Belva Crome, MD;  Location: [facility];  Service: Cardiovascular;  Laterality: N/A;  . ROTATOR CUFF REPAIR       Current Outpatient Medications   Medication Sig Dispense Refill  . albuterol (VENTOLIN HFA) 108 (90 Base) MCG/ACT inhaler Inhale 2 puffs into the lungs every 4 (four) hours as needed for wheezing. 1 each 2  . apixaban (ELIQUIS) 5 MG TABS tablet Take 5 mg by mouth 2 (two) times daily.    [name] atorvastatin (LIPITOR) 80 MG tablet Take 1 tablet (80 mg total) by mouth daily. 90 tablet 3  . buPROPion (WELLBUTRIN XL) 300 MG 24 hr tablet Take 1 tablet (300 mg total) by mouth daily. 90 tablet 2  . butalbital-acetaminophen-caffeine (FIORICET) 50-325-40 MG tablet Take 1 tablet by mouth every 6 (six) hours as needed for migraine. 21 tablet 2  . Dulaglutide (TRULICITY) A999333 0000000 SOPN Inject 0.75 mg into the skin once a week. 2 mL 5  . EPINEPHrine 0.3 mg/0.3 mL IJ SOAJ injection Inject 0.3 mg into the muscle as needed for anaphylaxis. 1 each 1  . ezetimibe (ZETIA) 10 MG tablet Take 1 tablet (10 mg total) by mouth daily. 30 tablet 3  . fenofibrate (TRICOR) 145 MG tablet Take 145 mg by mouth daily.    . flecainide (TAMBOCOR) 50 MG tablet Take 1 tablet (50  mg total) by mouth 2 (two) times daily. 90 tablet 6  . Galcanezumab-gnlm (EMGALITY) 120 MG/ML SOAJ Inject 120 mg into the skin every 30 (thirty) days.    . isosorbide mononitrate (IMDUR) 30 MG 24 hr tablet Take 0.5 tablets (15 mg total) by mouth daily. 15 tablet 6  . levocetirizine (XYZAL) 5 MG tablet Take 1 tablet (5 mg total) by mouth every evening. 30 tablet 2  . Lurasidone HCl 120 MG TABS Take 1 tablet (120 mg total) by mouth at bedtime. 90 tablet 1  . metFORMIN (GLUCOPHAGE) 1000 MG tablet Take 1,000 mg by mouth 2 (two) times daily with a meal.    . metoprolol succinate (TOPROL-XL) 50 MG 24 hr tablet Take 1 tablet (50 mg total) by mouth daily. Take with or immediately following a meal. 90 tablet 3  . nitroGLYCERIN (NITROSTAT) 0.4 MG SL tablet Place 0.4 mg under the tongue every 5 (five) minutes as needed for chest pain.    [name] ondansetron (ZOFRAN-ODT) 4 MG disintegrating tablet Take 1  tablet (4 mg total) by mouth every 8 (eight) hours as needed for nausea. 20 tablet 2  . pantoprazole (PROTONIX) 40 MG tablet Take 40 mg by mouth daily.    . QUEtiapine (SEROQUEL) 300 MG tablet Take 1 tablet (300 mg total) by mouth at bedtime. 90 tablet 2  . rizatriptan (MAXALT) 10 MG tablet Take 1 tablet (10 mg total) by mouth as needed for migraine. May repeat in 2 hours if needed 10 tablet 0  . topiramate (TOPAMAX) 50 MG tablet Take 150 mg by mouth 2 (two) times daily.    [name] Ubrogepant (UBRELVY) 100 MG TABS Take 100 mg by mouth as needed. 10 tablet 3   No current facility-administered medications for this visit.    Allergies:   Bee venom, Onion, Sumatriptan, Asa [aspirin], Nsaids, Iodine, Sulfa antibiotics, and Vancomycin   Social History:  The patient  reports that she has never smoked. She has never used smokeless tobacco. She reports that she does not drink alcohol and does not use drugs.   Family History:  The patient's family history includes Bipolar disorder in her daughter and son. She was adopted.   ROS:  Please see the history of present illness.   Otherwise, review of systems is positive for none.   All other systems are reviewed and negative.   PHYSICAL EXAM: VS:  BP 132/80   Pulse 66   Ht '5\' 6"'$  (1.676 m)   Wt 230 lb 0.6 oz (104.3 kg)   BMI 37.13 kg/m  , BMI Body mass index is 37.13 kg/m. GEN: Well nourished, well developed, in no acute distress  HEENT: normal  Neck: no JVD, carotid bruits, or masses Cardiac: RRR; no murmurs, rubs, or gallops,no edema  Respiratory:  clear to auscultation bilaterally, normal work of breathing GI: soft, nontender, nondistended, + BS MS: no deformity or atrophy, pain with palpation of the left chest Skin: warm and dry Neuro:  Strength and sensation are intact Psych: euthymic mood, full affect  EKG:  EKG is ordered today. Personal review of the ekg ordered shows sinus rhythm, rate 66  Recent Labs: 10/13/2020: ALT 28; BUN 16; Creatinine,  Ser 1.81; Potassium 4.4; Sodium 140 10/20/2020: Hemoglobin 11.9; Platelets 376.0    Lipid Panel     Component Value Date/Time   CHOL 175 10/13/2020 0856   TRIG 197.0 (H) 10/13/2020 0856   HDL 31.60 (L) 10/13/2020 0856   CHOLHDL 6 10/13/2020 0856   VLDL 39.4  10/13/2020 0856   LDLCALC 104 (H) 10/13/2020 0856     Wt Readings from Last 3 Encounters:  01/19/21 230 lb 0.6 oz (104.3 kg)  11/17/20 234 lb 1.3 oz (106.2 kg)  11/13/20 230 lb (104.3 kg)      Other studies Reviewed: Additional studies/ records that were reviewed today include: TTE 02/20/20  Review of the above records today demonstrates:  1. Left ventricular ejection fraction, by estimation, is 60 to 65%. The  left ventricle has normal function. The left ventricle has no regional  wall motion abnormalities. Left ventricular diastolic parameters are  consistent with Grade I diastolic  dysfunction (impaired relaxation). Elevated left ventricular end-diastolic  pressure.  2. Right ventricular systolic function is normal. The right ventricular  size is normal. There is normal pulmonary artery systolic pressure. The  estimated right ventricular systolic pressure is 0000000 mmHg.  3. The mitral valve is normal in structure. No evidence of mitral valve  regurgitation. No evidence of mitral stenosis.  4. The aortic valve is normal in structure. Aortic valve regurgitation is  not visualized. No aortic stenosis is present.  5. Aortic dilatation noted. 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  6. The inferior vena cava is normal in size with greater than 50%  respiratory variability, suggesting right atrial pressure of 3 mmHg.   LHC 03/04/20  Left dominant coronary anatomy  Normal left main  Normal LAD that wraps around the left ventricular apex and supplies one half of the posterior interventricular groove territory.  Large first diagonal/ramus intermedius that covers the distribution typically  supplied by diagonal branches.  Nondominant right coronary  Dominant circumflex coronary artery.  The circumflex coronary is tortuous including the first obtuse marginal branch.  Normal left ventricular end-diastolic pressure.  Ventriculography not performed to limit contrast exposure.  Cardiac monitor 05/07/2020 personally reviewed Max 146 bpm 03:36am, 08/16 Min 52 bpm 05:33am, 08/08 Avg 68 bpm 1.7% PACs, <1% PVCs Predominant rhythm was sinus rhythm 3 short SVT episodes, longest 5 beats All triggered events/symptoms associated with sinus rhythm  Myoview 11/25/2020  Nuclear stress EF: 64%.  The left ventricular ejection fraction is normal (55-65%).  No T wave inversion was noted during stress.  There was no ST segment deviation noted during stress.  Defect 1: There is a small defect of mild severity present in the apex location.  This is a low risk study.   ASSESSMENT AND PLAN:  1.  Paroxysmal atrial fibrillation: Currently on Eliquis, diltiazem, metoprolol.  Status post ablation 09/27/2018 with PVI and CTI in [place].  Remains in sinus rhythm.  No changes at this time.    2.  Hyperlipidemia: Continue atorvastatin per primary cardiology  3.  SVT: Currently on diltiazem and metoprolol.  She wore a cardiac monitor that showed no further episodes.  No changes.  4.  Chest pain: Has pain when she exerts herself and better with rest.  Also, at times has pain at rest.  She has taken nitroglycerin once which somewhat improved her pain.  Despite that, with palpation, chest pain is worsened.  I do feel that this is noncardiac, though as it did improve with nitroglycerin, Dannika Hilgeman start Imdur 15 mg.  If she continues to have this discomfort, I have instructed her to speak with her primary physician.  Current medicines are reviewed at length with the patient today.   The patient does not have concerns regarding her medicines.  The following changes were made today: Start  Imdur  Labs/ tests ordered today include:  Orders Placed This Encounter  Procedures  . EKG 12-Lead     Disposition:   FU with Lopaka Karge 6 months  Signed, Reinhardt Licausi [name], MD  01/19/2021 3:46 PM     [address] [place] [phone] (office) [phone] (fax)

## 2021-01-19 NOTE — Patient Instructions (Signed)
Medication Instructions:  Your physician has recommended you make the following change in your medication:  1. START Imdur 15 mg once daily  *If you need a refill on your cardiac medications before your next appointment, please call your pharmacy*   Lab Work: None ordered   Testing/Procedures: None ordered   Follow-Up: At [facility], you and your health needs are our priority.  As part of our continuing mission to provide you with exceptional heart care, we have created designated Provider Care Teams.  These Care Teams include your primary Cardiologist (physician) and Advanced Practice Providers (APPs -  Physician Assistants and Nurse Practitioners) who all work together to provide you with the care you need, when you need it.  Your next appointment:   6 month(s)  The format for your next appointment:   In Person  Provider:   Allegra Lai, MD    Thank you for choosing [facility]!!   Trinidad Curet, RN [phone]   Other Instructions

## 2021-01-24 ENCOUNTER — Other Ambulatory Visit: Payer: Self-pay | Admitting: Family Medicine

## 2021-01-26 ENCOUNTER — Ambulatory Visit (INDEPENDENT_AMBULATORY_CARE_PROVIDER_SITE_OTHER): Payer: 59 | Admitting: Licensed Clinical Social Worker

## 2021-01-26 ENCOUNTER — Other Ambulatory Visit: Payer: Self-pay

## 2021-01-26 DIAGNOSIS — F3130 Bipolar disorder, current episode depressed, mild or moderate severity, unspecified: Secondary | ICD-10-CM

## 2021-01-26 DIAGNOSIS — F411 Generalized anxiety disorder: Secondary | ICD-10-CM

## 2021-01-26 NOTE — Progress Notes (Signed)
THERAPIST PROGRESS NOTE   Session Time: 1:00pm - 1:45pm   Location: Patient: [facility] Office Provider: [facility] [place] Office    Participation Level: Active   Behavioral Response: Alert, casually dressed, anxious affect/mood   Type of Therapy:  Individual Therapy   Treatment Goals addressed: Medication compliance; Anxiety and depression management    Interventions: CBT, problem solving, guided imagery    Summary: Paula Stout is a 65 year old widowed Caucasian female that presented for therapy session today with diagnoses of Generalized anxiety disorder; and Bipolar I Disorder, most recent episode depressed.     Suicidal/Homicidal: None; without plan or intent.    Therapist Response: Clinician met with Paula Stout, who prefers to go by "Paula Stout" for in-person appointment and assessed for safety, medication compliance, and sobriety.  Paula Stout presented for today's session on time and was alert, oriented x5, with no evidence or self-report of active SI/HI or A/V H.  Paula Stout reported ongoing compliance with medication and denied use of alcohol or illicit substances.  Clinician inquired about [name]'s emotional ratings today, as well as any significant changes in thoughts, feelings, or behavior since last check-in.  Paula Stout reported scores of 5/10 for depression, 8/10 for anxiety, 0/10 for anger/irritability, and reported that one outburst a few days ago when her daughter discovered that Paula Stout will be leaving for [place] in a few weeks.  Paula Stout stated "She kept trying to convince me that it wasn't a good idea and I finally snapped and yelled about how bad it is in the house.  Nothing has come up again since that and I've mostly kept to myself".  Clinician inquired about progress that Paula Stout had made towards preparing for this upcoming transition since last session's discussion on goal-setting.  Paula Stout reported that she has succeeded in packing up many items and putting them in storage, in addition to reserving a  U-haul.  Clinician assisted Paula Stout in identifying tasks she can focus on this week to stay productive, in addition to prioritizing them by importance.  Paula Stout reported that primary tasks of focus will include calling her insurance company about coverage availability out of state, and acquiring 90 day refills for medication, while taking her dog to the vet for shots and getting her oil changed will be next.  Clinician proposed practicing relaxation technique today in session to aid in reducing [name]'s depression and anxiety ratings following recent stressor, and she was agreeable to this. Clinician provided Paula Stout with options for various techniques, and she expressed interest in guided imagery involving visiting the beach.  Clinician guided Paula Stout through process of getting comfortable, achieving relaxing breathing pattern, and then visualizing a trip to the beach on a sunny day over course of 15 minutes, including various pleasant sensory details to enhance experience such as the feeling of sand underfoot, sight/sounds of waves crashing, smell of salt in the air, etc.  Interventions were effective, as evidenced by Paula Stout successfully participating in activity and reporting that she did feel calmer and more relaxed afterward, stating "It made me think back to family beach trips we took when my husband was still alive.  It was nice to think about and I could imagine the seagulls and feel the sand".  Paula Stout reported that todays discussion also helped calm her anxieties and improve her focus on goals, noting that the last time she planned to move to [place], her daughter played upon her anxieties to change her mind.  Clinician provided Paula Stout with a handout on today's exercise, and encouraged her  to practice as part of developing self-care routine.  Clinician will continue to monitor.       Plan: Follow up again in 2 weeks.    Diagnosis: Generalized anxiety disorder; and Bipolar I Disorder, most recent episode depressed    [name], [place], LCAS 01/26/21

## 2021-01-31 ENCOUNTER — Other Ambulatory Visit: Payer: Self-pay | Admitting: Family Medicine

## 2021-02-04 ENCOUNTER — Telehealth (HOSPITAL_COMMUNITY): Payer: Self-pay | Admitting: *Deleted

## 2021-02-04 NOTE — Telephone Encounter (Signed)
Placed call to patient to inform of cancelled appointment.  Discussed need for patient to secure another provider and gave information regarding the resource letter that has been mailed.  Patient will not  need refills of  medications.  Patient verbalized understanding.

## 2021-02-05 ENCOUNTER — Other Ambulatory Visit: Payer: Self-pay

## 2021-02-05 DIAGNOSIS — I471 Supraventricular tachycardia, unspecified: Secondary | ICD-10-CM | POA: Insufficient documentation

## 2021-02-05 DIAGNOSIS — I4891 Unspecified atrial fibrillation: Secondary | ICD-10-CM | POA: Insufficient documentation

## 2021-02-06 ENCOUNTER — Ambulatory Visit (INDEPENDENT_AMBULATORY_CARE_PROVIDER_SITE_OTHER): Payer: 59 | Admitting: Cardiology

## 2021-02-06 ENCOUNTER — Encounter: Payer: Self-pay | Admitting: Cardiology

## 2021-02-06 ENCOUNTER — Other Ambulatory Visit: Payer: Self-pay

## 2021-02-06 VITALS — BP 128/78 | HR 64 | Ht 66.0 in | Wt 232.0 lb

## 2021-02-06 DIAGNOSIS — N139 Obstructive and reflux uropathy, unspecified: Secondary | ICD-10-CM

## 2021-02-06 DIAGNOSIS — E1300 Other specified diabetes mellitus with hyperosmolarity without nonketotic hyperglycemic-hyperosmolar coma (NKHHC): Secondary | ICD-10-CM | POA: Insufficient documentation

## 2021-02-06 DIAGNOSIS — F319 Bipolar disorder, unspecified: Secondary | ICD-10-CM | POA: Insufficient documentation

## 2021-02-06 DIAGNOSIS — J45909 Unspecified asthma, uncomplicated: Secondary | ICD-10-CM

## 2021-02-06 DIAGNOSIS — G4730 Sleep apnea, unspecified: Secondary | ICD-10-CM

## 2021-02-06 DIAGNOSIS — G4700 Insomnia, unspecified: Secondary | ICD-10-CM

## 2021-02-06 DIAGNOSIS — E119 Type 2 diabetes mellitus without complications: Secondary | ICD-10-CM

## 2021-02-06 DIAGNOSIS — E669 Obesity, unspecified: Secondary | ICD-10-CM

## 2021-02-06 DIAGNOSIS — E782 Mixed hyperlipidemia: Secondary | ICD-10-CM | POA: Diagnosis not present

## 2021-02-06 DIAGNOSIS — G43009 Migraine without aura, not intractable, without status migrainosus: Secondary | ICD-10-CM | POA: Insufficient documentation

## 2021-02-06 DIAGNOSIS — G43019 Migraine without aura, intractable, without status migrainosus: Secondary | ICD-10-CM

## 2021-02-06 DIAGNOSIS — E1165 Type 2 diabetes mellitus with hyperglycemia: Secondary | ICD-10-CM

## 2021-02-06 DIAGNOSIS — G43919 Migraine, unspecified, intractable, without status migrainosus: Secondary | ICD-10-CM | POA: Insufficient documentation

## 2021-02-06 DIAGNOSIS — E1142 Type 2 diabetes mellitus with diabetic polyneuropathy: Secondary | ICD-10-CM

## 2021-02-06 DIAGNOSIS — G40909 Epilepsy, unspecified, not intractable, without status epilepticus: Secondary | ICD-10-CM

## 2021-02-06 DIAGNOSIS — I480 Paroxysmal atrial fibrillation: Secondary | ICD-10-CM

## 2021-02-06 DIAGNOSIS — N200 Calculus of kidney: Secondary | ICD-10-CM | POA: Insufficient documentation

## 2021-02-06 HISTORY — DX: Obstructive and reflux uropathy, unspecified: N13.9

## 2021-02-06 HISTORY — DX: Sleep apnea, unspecified: G47.30

## 2021-02-06 HISTORY — DX: Insomnia, unspecified: G47.00

## 2021-02-06 HISTORY — DX: Migraine without aura, intractable, without status migrainosus: G43.019

## 2021-02-06 HISTORY — DX: Bipolar disorder, unspecified: F31.9

## 2021-02-06 HISTORY — DX: Other specified diabetes mellitus with hyperosmolarity without nonketotic hyperglycemic-hyperosmolar coma (NKHHC): E13.00

## 2021-02-06 HISTORY — DX: Calculus of kidney: N20.0

## 2021-02-06 HISTORY — DX: Type 2 diabetes mellitus with diabetic polyneuropathy: E11.42

## 2021-02-06 HISTORY — DX: Unspecified asthma, uncomplicated: J45.909

## 2021-02-06 HISTORY — DX: Migraine without aura, not intractable, without status migrainosus: G43.009

## 2021-02-06 HISTORY — DX: Migraine, unspecified, intractable, without status migrainosus: G43.919

## 2021-02-06 HISTORY — DX: Epilepsy, unspecified, not intractable, without status epilepticus: G40.909

## 2021-02-06 HISTORY — DX: Type 2 diabetes mellitus with hyperglycemia: E11.65

## 2021-02-06 NOTE — Patient Instructions (Signed)
Medication Instructions:  No medication changes. *If you need a refill on your cardiac medications before your next appointment, please call your pharmacy*   Lab Work: Your physician recommends that you return for lab work in: the few days. You need to have labs done when you are fasting.  You can come Monday through Friday 8:30 am to 12:00 pm and 1:15 to 4:30. You do not need to make an appointment as the order has already been placed. The labs you are going to have done are BMET, CBC, TSH, hgb A1C, LFT and Lipids.  If you have labs (blood work) drawn today and your tests are completely normal, you will receive your results only by: [name] MyChart Message (if you have MyChart) OR . A paper copy in the mail If you have any lab test that is abnormal or we need to change your treatment, we will call you to review the results.   Testing/Procedures: None ordered   Follow-Up: At [facility], you and your health needs are our priority.  As part of our continuing mission to provide you with exceptional heart care, we have created designated Provider Care Teams.  These Care Teams include your primary Cardiologist (physician) and Advanced Practice Providers (APPs -  Physician Assistants and Nurse Practitioners) who all work together to provide you with the care you need, when you need it.  We recommend signing up for the patient portal called "MyChart".  Sign up information is provided on this After Visit Summary.  MyChart is used to connect with patients for Virtual Visits (Telemedicine).  Patients are able to view lab/test results, encounter notes, upcoming appointments, etc.  Non-urgent messages can be sent to your provider as well.   To learn more about what you can do with MyChart, go to [email or web address].    Your next appointment:   6 month(s)  The format for your next appointment:   In Person  Provider:   Jyl Heinz, MD   Other Instructions NA

## 2021-02-06 NOTE — Progress Notes (Signed)
Cardiology Office Note:    Date:  02/06/2021   ID:  Paula Stout, DOB 18-Apr-1956, MRN [id number]  PCP:  [name], DO  Cardiologist:  Jenean Lindau, MD   Referring MD: [name]*    ASSESSMENT:    1. Paroxysmal atrial fibrillation (HCC)   2. Obesity (BMI 30-39.9)   3. Mixed hyperlipidemia   4. Diabetes mellitus without complication ([place])    PLAN:    In order of problems listed above:  1. Primary prevention stressed with the patient.  Importance of compliance with diet medication stressed and she vocalized understanding. 2. Paroxysmal atrial fibrillation:I discussed with the patient atrial fibrillation, disease process. Management and therapy including rate and rhythm control, anticoagulation benefits and potential risks were discussed extensively with the patient. Patient had multiple questions which were answered to patient's satisfaction. 3. Mixed dyslipidemia and diabetes mellitus: Diet was emphasized.  Lifestyle modification suggested importance of regular exercise stressed.  Risks of obesity explained weight reduction was emphasized and she promises to do better. 4. Renal insufficiency: I discussed this with her at extensive length.  And told her that lifestyle modification and a forementioned will help in reducing the pace of renal deterioration.  She understands. 5. Patient will be seen in follow-up appointment in 6 months or earlier if the patient has any concerns 6. She will be back in the next few days for complete blood work as requested by the patient.   Medication Adjustments/Labs and Tests Ordered: Current medicines are reviewed at length with the patient today.  Concerns regarding medicines are outlined above.  No orders of the defined types were placed in this encounter.  No orders of the defined types were placed in this encounter.    No chief complaint on file.    History of Present Illness:    Paula Stout is a 65 y.o.  female.  Patient has past medical history of paroxysmal atrial fibrillation, diabetes mellitus dyslipidemia and obesity.  She denies any problems at this time and takes care of activities of daily living.  No chest pain orthopnea or PND.  She leads a sedentary lifestyle.  At the time of my evaluation, the patient is alert awake oriented and in no distress.  Past Medical History:  Diagnosis Date  . A-fib ([place])   . Abscess 02/03/2019  . Acute cystitis without hematuria 05/14/2017   Last Assessment & Plan:  Formatting of this note might be different from the original. - suprapubic tenderness with symptoms of urinary frequency and urgency and subjective fevers (no fevers recorded on any visit). Recently passed a renal stone, US shows left hydronephrosis which is improving.  - UA negative8/24 but pt is clinically symptomatic - will repeat UA  - start pt on Microbid for 5 days  . 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 Anxiety   . Asthma   . At risk for falls 02/04/2019  . Atrial fibrillation ([place]) 09/01/2014   Last Assessment & Plan:  Formatting of this note might be different from the original. A:  Chronic.  Sinus rhythm at this time.  States she was told this during admission at [facility] in the past. P:  On baby aspirin at home will continue.  Low dose metoprolol started.  . Bipolar 1 disorder ([place]) 10/11/2013  . Bipolar 1 disorder, depressed, moderate ([place]) 02/18/2020  . Bipolar disorder ([place])   . Cellulitis 02/03/2019  . Chest pain 02/19/2020  . Chronic anticoagulation 02/20/2020  . Chronic atrial  fibrillation ([place]) 02/18/2020  . Chronic headache 10/11/2013  . Chronic migraine without aura, intractable, without status migrainosus 06/24/2017  . Chronic pain of both shoulders 11/14/2015  . Colon cancer screening 04/15/2016   Last Assessment & Plan:  Formatting of this note might be different from the original. Will schedule for colonoscopy  . Depression   . Depression, recurrent ([place]) 02/18/2020   . Diabetes mellitus ([place])   . Diabetes mellitus due to underlying condition with unspecified complications ([place]) 0000000  . Diabetes mellitus type 2 in obese ([place]) 10/11/2013   Last Assessment & Plan:  Formatting of this note might be different from the original. A:  Diabetes Mellitus Type 2 without long term use of insulin P:  Hold Home metformin. Reg ISS.  . Diabetes mellitus without complication ([place])   . Dyslipidemia 02/20/2020  . GAD (generalized anxiety disorder) 02/18/2020  . Heartburn 04/15/2016   Last Assessment & Plan:  Formatting of this note might be different from the original. Patient was counseled regarding lifestyle modification  Advised to take Prilosec 30 mins before breakfast  Will schedule for EGD  . History of atrial fibrillation 06/24/2017  . History of posttraumatic stress disorder (PTSD) 10/07/2016  . HLD (hyperlipidemia) 09/01/2014   Last Assessment & Plan:  Formatting of this note might be different from the original. A: Chronic. P: Continue statin.  [name] Hypertriglyceridemia 02/18/2020  . Impaired mobility and activities of daily living 11/14/2015  . Irritable bowel syndrome (IBS)   . Left elbow pain 11/14/2015  . Migraines 10/11/2013   Last Assessment & Plan:  Formatting of this note might be different from the original. A: Chronic. P: Continue Topamax. (also takes it for bipolar)  . Morbid obesity ([place]) 12/16/2016  . Multiple sclerosis ([place]) 06/24/2017  . Obesity (BMI 30-39.9) 09/01/2014   Last Assessment & Plan:  Formatting of this note might be different from the original. A: Morbid obesity, with BMI of 35.0 - 39.9 P: Counseled to loose wt.  . Paroxysmal atrial fibrillation ([place]) 04/14/2020  . Renal colic on left side 0000000  . Seizure disorder ([place]) 06/24/2017  . Sleep-disordered breathing 06/05/2018  . Stage 3 chronic kidney disease ([place]) 02/20/2020  . Unstable angina ([place]) 09/01/2014   Last Assessment & Plan:  Formatting of this note might be different from the  original. A: Pt has 2 months exertional angina, recently developed angina at rest intermittently.  She had a positive nuclear medicine cardiolyte stress test with anteroapical ischemia on stress portion not seen at rest. P:  - continue aspirin, statin, metoprolol - Discussed stress test findings with cardiologist Dr. Landis Gandy    Past Surgical History:  Procedure Laterality Date  . ABDOMINAL HYSTERECTOMY     Fibroids  . ATRIAL FIBRILLATION ABLATION    . CARDIAC CATHETERIZATION    . CESAREAN SECTION    . KNEE SURGERY    . LEFT HEART CATH AND CORONARY ANGIOGRAPHY N/A 03/04/2020   Procedure: LEFT HEART CATH AND CORONARY ANGIOGRAPHY;  Surgeon: Belva Crome, MD;  Location: [place] [facility];  Service: Cardiovascular;  Laterality: N/A;  . ROTATOR CUFF REPAIR      Current Medications: Current Meds  Medication Sig  . albuterol (VENTOLIN HFA) 108 (90 Base) MCG/ACT inhaler Inhale 2 puffs into the lungs every 4 (four) hours as needed for wheezing.  [name] apixaban (ELIQUIS) 5 MG TABS tablet Take 5 mg by mouth 2 (two) times daily.  [name] atorvastatin (LIPITOR) 80 MG tablet Take 1 tablet (80 mg total)  by mouth daily.  [name] buPROPion (WELLBUTRIN XL) 300 MG 24 hr tablet Take 1 tablet (300 mg total) by mouth daily.  . butalbital-acetaminophen-caffeine (FIORICET) 50-325-40 MG tablet Take 1 tablet by mouth every 6 (six) hours as needed for migraine.  . Dulaglutide (TRULICITY) A999333 0000000 SOPN Inject 0.75 mg into the skin once a week.  [name] EPINEPHrine 0.3 mg/0.3 mL IJ SOAJ injection Inject 0.3 mg into the muscle as needed for anaphylaxis.  [name] ezetimibe (ZETIA) 10 MG tablet Take 10 mg by mouth daily.  . fenofibrate 160 MG tablet Take 160 mg by mouth daily.  . flecainide (TAMBOCOR) 50 MG tablet Take 1 tablet (50 mg total) by mouth 2 (two) times daily.  . Galcanezumab-gnlm (EMGALITY) 120 MG/ML SOAJ Inject 120 mg into the skin every 30 (thirty) days.  . isosorbide mononitrate (IMDUR) 30 MG 24 hr tablet Take 0.5 tablets (15  mg total) by mouth daily.  [name] levocetirizine (XYZAL) 5 MG tablet Take 5 mg by mouth every evening.  . Lurasidone HCl 120 MG TABS Take 1 tablet (120 mg total) by mouth at bedtime.  . metFORMIN (GLUCOPHAGE) 1000 MG tablet Take 1,000 mg by mouth 2 (two) times daily with a meal.  . metoprolol succinate (TOPROL-XL) 50 MG 24 hr tablet Take 1 tablet (50 mg total) by mouth daily. Take with or immediately following a meal.  . nitroGLYCERIN (NITROSTAT) 0.4 MG SL tablet Place 0.4 mg under the tongue every 5 (five) minutes as needed for chest pain.  [name] ondansetron (ZOFRAN-ODT) 4 MG disintegrating tablet Take 1 tablet (4 mg total) by mouth every 8 (eight) hours as needed for nausea.  . pantoprazole (PROTONIX) 40 MG tablet Take 40 mg by mouth daily.  . QUEtiapine (SEROQUEL) 300 MG tablet Take 1 tablet (300 mg total) by mouth at bedtime.  . rizatriptan (MAXALT) 10 MG tablet Take 1 tablet (10 mg total) by mouth as needed for migraine. May repeat in 2 hours if needed  . topiramate (TOPAMAX) 50 MG tablet Take 150 mg by mouth 2 (two) times daily.  [name] Ubrogepant 100 MG TABS Take 100 mg by mouth as needed for migraine.     Allergies:   Bee venom, Onion, Sumatriptan, Asa [aspirin], Nsaids, Iodine, Sulfa antibiotics, and Vancomycin   Social History   Socioeconomic History  . Marital status: Widowed    Spouse name: Not on file  . Number of children: 6  . Years of education: Not on file  . Highest education level: Not on file  Occupational History  . Not on file  Tobacco Use  . Smoking status: Never Smoker  . Smokeless tobacco: Never Used  Vaping Use  . Vaping Use: Never used  Substance and Sexual Activity  . Alcohol use: Never  . Drug use: Never  . Sexual activity: Not on file  Other Topics Concern  . Not on file  Social History Narrative  . Not on file   Social Determinants of Health   Financial Resource Strain: Not on file  Food Insecurity: Not on file  Transportation Needs: Not on file  Physical  Activity: Not on file  Stress: Not on file  Social Connections: Not on file     Family History: The patient's family history includes Bipolar disorder in her daughter and son. She was adopted.  ROS:   Please see the history of present illness.    All other systems reviewed and are negative.  EKGs/Labs/Other Studies Reviewed:    The following studies were reviewed  today: I discussed my findings with the patient at length.  EKG reveals sinus rhythm and nonspecific ST-T changes.   Recent Labs: 10/13/2020: ALT 28; BUN 16; Creatinine, Ser 1.81; Potassium 4.4; Sodium 140 10/20/2020: Hemoglobin 11.9; Platelets 376.0  Recent Lipid Panel    Component Value Date/Time   CHOL 175 10/13/2020 0856   TRIG 197.0 (H) 10/13/2020 0856   HDL 31.60 (L) 10/13/2020 0856   CHOLHDL 6 10/13/2020 0856   VLDL 39.4 10/13/2020 0856   LDLCALC 104 (H) 10/13/2020 0856    Physical Exam:    VS:  BP 128/78   Pulse 64   Ht '5\' 6"'$  (1.676 m)   Wt 232 lb (105.2 kg)   SpO2 95%   BMI 37.45 kg/m     Wt Readings from Last 3 Encounters:  02/06/21 232 lb (105.2 kg)  01/19/21 230 lb 0.6 oz (104.3 kg)  11/17/20 234 lb 1.3 oz (106.2 kg)     GEN: Patient is in no acute distress HEENT: Normal NECK: No JVD; No carotid bruits LYMPHATICS: No lymphadenopathy CARDIAC: Hear sounds regular, 2/6 systolic murmur at the apex. RESPIRATORY:  Clear to auscultation without rales, wheezing or rhonchi  ABDOMEN: Soft, non-tender, non-distended MUSCULOSKELETAL:  No edema; No deformity  SKIN: Warm and dry NEUROLOGIC:  Alert and oriented x 3 PSYCHIATRIC:  Normal affect   Signed, Jenean Lindau, MD  02/06/2021 2:20 PM    [facility]

## 2021-02-09 ENCOUNTER — Other Ambulatory Visit: Payer: Self-pay

## 2021-02-09 ENCOUNTER — Ambulatory Visit (INDEPENDENT_AMBULATORY_CARE_PROVIDER_SITE_OTHER): Payer: 59 | Admitting: Licensed Clinical Social Worker

## 2021-02-09 DIAGNOSIS — F411 Generalized anxiety disorder: Secondary | ICD-10-CM

## 2021-02-09 DIAGNOSIS — F3130 Bipolar disorder, current episode depressed, mild or moderate severity, unspecified: Secondary | ICD-10-CM | POA: Diagnosis not present

## 2021-02-09 NOTE — Progress Notes (Signed)
THERAPIST PROGRESS NOTE   Session Time: 11:00am - 12:00pm    Location: Patient: [facility] [place] [facility] Provider: [facility] [place] [facility]    Participation Level: Active   Behavioral Response: Alert, casually dressed, anxious affect/mood   Type of Therapy:  Individual Therapy   Treatment Goals addressed: Medication compliance; Anxiety and depression management; Working with support system regarding upcoming move out of state; Writing in journal     Interventions: CBT, problem solving, communication skills   Summary: [name] is a 65 year old widowed Caucasian female that presented for therapy session today with diagnoses of Generalized anxiety disorder; and Bipolar I Disorder, most recent episode depressed.     Suicidal/Homicidal: None; without plan or intent.    Therapist Response: Clinician met with Clarene Critchley, who prefers to go by "Paula Stout" for in-person session and assessed for safety, medication compliance, and sobriety.  Paula Stout presented for today's appointment on time and was alert, oriented x5, with no evidence or self-report of active SI/HI or A/V H.  Paula Stout reported that she continues taking medication as prescribed and denied use of alcohol or illicit substances.  Clinician inquired about [name]'s current emotional ratings, as well as any significant changes in thoughts, feelings, or behavior since previous check-in.  Paula Stout reported scores of 7/10 for depression, 8/10 for anxiety, 4/10 for anger/irritability, and reported that she had experienced one outburst towards her daughter last Monday.  Paula Stout reported that she documented this in her journal, noting that she had brought up the cleanliness of the home and asked her daughter to clean up some of the kitty litter boxes due to the smell affecting her asthma, but the daughter responded passively aggressively, and this has led to both of them distancing from one another since then.  Clinician reviewed material with Paula Stout today on assertive  communication skills that can be utilized when interacting with her daughter, as well as communication traps (i.e. criticisms, stonewalling, put downs, etc) that should be avoided.  Clinician also revisited previous task list from earlier sessions with Paula Stout regarding steps she needs to take over following weeks in order to ensure less stressful transition to [place] to stay with son.  Paula Stout reported that she will pick and choose her 'battles' with the daughter carefully, use relaxation skills to stay calm, and continue expressing herself appropriately when necessary, noting that at times her daughter expects her to be a 'mind reader', which leads to frequent disputes.  She noted progress in task list, including getting her car ready for the trip, having the dog taken care of at the vet, and packing up a few items.  Paula Stout reported that in following days she will look into insurance options in [place], coordinate with her son ahead of him arriving in state next week, and pack up a few more essentials.  Interventions were effective, as evidenced by Paula Stout reporting reduction in anxiety down to 4/10 following this discussion, and stating "It really helps to talk about all this because I have no one else to open up with.  I do feel a lot better and just need to make it another week staying with her".  Clinician reminded Paula Stout to look into providers for therapy and psychiatry prior to move to [place] to avoid gaps in mental health care, and informed her of discharge policy with our office.  Paula Stout acknowledged understanding and reporting that she is working with her son on insurance coverage in meantime.  Clinician will continue to monitor.  Plan: Follow up again in 2 weeks.    Diagnosis: Generalized anxiety disorder; and Bipolar I Disorder, most recent episode depressed   [name], [place], LCAS 02/09/21

## 2021-02-10 MED ORDER — ISOSORBIDE MONONITRATE ER 30 MG PO TB24: 15.0000 mg | ORAL_TABLET | Freq: Every day | ORAL | 1 refills | Status: DC

## 2021-02-10 MED ORDER — TRULICITY 0.75 MG/0.5ML ~~LOC~~ SOAJ: 0.7500 mg | SUBCUTANEOUS | 3 refills | Status: DC

## 2021-02-11 LAB — CBC WITH DIFFERENTIAL/PLATELET
Basophils Absolute: 0.1 10*3/uL (ref 0.0–0.2)
Basos: 1 %
EOS (ABSOLUTE): 0.3 10*3/uL (ref 0.0–0.4)
Eos: 6 %
Hematocrit: 36.4 % (ref 34.0–46.6)
Hemoglobin: 11.7 g/dL (ref 11.1–15.9)
Immature Grans (Abs): 0 10*3/uL (ref 0.0–0.1)
Immature Granulocytes: 0 %
Lymphocytes Absolute: 1.8 10*3/uL (ref 0.7–3.1)
Lymphs: 34 %
MCH: 30.9 pg (ref 26.6–33.0)
MCHC: 32.1 g/dL (ref 31.5–35.7)
MCV: 96 fL (ref 79–97)
Monocytes Absolute: 0.4 10*3/uL (ref 0.1–0.9)
Monocytes: 7 %
Neutrophils Absolute: 2.8 10*3/uL (ref 1.4–7.0)
Neutrophils: 52 %
Platelets: 311 10*3/uL (ref 150–450)
RBC: 3.79 x10E6/uL (ref 3.77–5.28)
RDW: 13.3 % (ref 11.7–15.4)
WBC: 5.3 10*3/uL (ref 3.4–10.8)

## 2021-02-11 LAB — BASIC METABOLIC PANEL
BUN/Creatinine Ratio: 11 — ABNORMAL LOW (ref 12–28)
BUN: 18 mg/dL (ref 8–27)
CO2: 18 mmol/L — ABNORMAL LOW (ref 20–29)
Calcium: 9.3 mg/dL (ref 8.7–10.3)
Chloride: 106 mmol/L (ref 96–106)
Creatinine, Ser: 1.63 mg/dL — ABNORMAL HIGH (ref 0.57–1.00)
Glucose: 116 mg/dL — ABNORMAL HIGH (ref 65–99)
Potassium: 4.6 mmol/L (ref 3.5–5.2)
Sodium: 140 mmol/L (ref 134–144)
eGFR: 35 mL/min/{1.73_m2} — ABNORMAL LOW (ref >59)

## 2021-02-11 LAB — LIPID PANEL
Chol/HDL Ratio: 5.4 ratio — ABNORMAL HIGH (ref 0.0–4.4)
Cholesterol, Total: 141 mg/dL (ref 100–199)
HDL: 26 mg/dL — ABNORMAL LOW (ref >39)
LDL Chol Calc (NIH): 86 mg/dL (ref 0–99)
Triglycerides: 166 mg/dL — ABNORMAL HIGH (ref 0–149)
VLDL Cholesterol Cal: 29 mg/dL (ref 5–40)

## 2021-02-11 LAB — HEMOGLOBIN A1C
Est. average glucose Bld gHb Est-mCnc: 126 mg/dL
Hgb A1c MFr Bld: 6 % — ABNORMAL HIGH (ref 4.8–5.6)

## 2021-02-11 LAB — HEPATIC FUNCTION PANEL
ALT: 20 IU/L (ref 0–32)
AST: 25 IU/L (ref 0–40)
Albumin: 3.9 g/dL (ref 3.8–4.8)
Alkaline Phosphatase: 54 IU/L (ref 44–121)
Bilirubin Total: 0.3 mg/dL (ref 0.0–1.2)
Bilirubin, Direct: 0.14 mg/dL (ref 0.00–0.40)
Total Protein: 6.7 g/dL (ref 6.0–8.5)

## 2021-02-11 LAB — TSH: TSH: 1.5 u[IU]/mL (ref 0.450–4.500)

## 2021-02-24 ENCOUNTER — Ambulatory Visit (INDEPENDENT_AMBULATORY_CARE_PROVIDER_SITE_OTHER): Payer: 59 | Admitting: Licensed Clinical Social Worker

## 2021-02-24 ENCOUNTER — Other Ambulatory Visit: Payer: Self-pay

## 2021-02-24 DIAGNOSIS — F3130 Bipolar disorder, current episode depressed, mild or moderate severity, unspecified: Secondary | ICD-10-CM

## 2021-02-24 DIAGNOSIS — F411 Generalized anxiety disorder: Secondary | ICD-10-CM

## 2021-02-24 NOTE — Progress Notes (Signed)
Virtual Visit via Video Note   I connected with Paula "Takeyla Million on 02/24/21 at 11:00am by video enabled telemedicine application and verified that I am speaking with the correct person using two identifiers.   I discussed the limitations, risks, security and privacy concerns of performing an evaluation and management service by video and the availability of in person appointments. I also discussed with the patient that there may be a patient responsible charge related to this service. The patient expressed understanding and agreed to proceed.   I discussed the assessment and treatment plan with the patient. The patient was provided an opportunity to ask questions and all were answered. The patient agreed with the plan and demonstrated an understanding of the instructions.   The patient was advised to call back or seek an in-person evaluation if the symptoms worsen or if the condition fails to improve as anticipated.   I provided 35 minutes of non-face-to-face time during this encounter.     [name], LCSW, LCAS __________________________  THERAPIST PROGRESS NOTE   Session Time: 11:00am - 11:35am    Location: Patient: Patient Home  Provider: [facility] [place] [facility]   Participation Level: Active   Behavioral Response: Alert, casually dressed, anxious affect/mood   Type of Therapy:  Individual Therapy   Treatment Goals addressed: Medication compliance; Anxiety and depression management; Working with support system regarding upcoming move out of state    Interventions: CBT, ACT skills, discharge    Summary: Simranjit "Paula Stout" Stout is a 65 year old widowed Caucasian female that presented for therapy session today with diagnoses of Generalized anxiety disorder; and Bipolar I Disorder, most recent episode depressed.     Suicidal/Homicidal: None; without plan or intent.   Therapist Response: Clinician met with Paula Stout, who prefers to go by "Paula Stout" for virtual appointment and assessed for  safety, medication compliance, and sobriety.  Paula Stout presented for today's session on time and was alert, oriented x5, with no evidence or self-report of active SI/HI or A/V H.  Paula Stout reported ongoing compliance with medication and denied use of alcohol or illicit substances.  Clinician inquired about [name]'s emotional ratings today, as well as any significant changes in thoughts, feelings, or behavior since last check-in.  Paula Stout reported scores of 3/10 for depression, 5/10 for anxiety, 4/10 for anger/irritability, and reported that she had experienced roughly 7-8 outbursts over recent weeks when interacting with her daughter.  Paula Stout reported that the daughter invaded her privacy again by looking at [employer] with friends and family, and then confronting Paula Stout about what was written, stating "She claimed she did nothing wrong, and had the right to do whatever she wanted because it was her house".  Paula Stout reported that she has been very anxious and irritable since then, and has experienced racing thoughts.  Clinician offered to teach Paula Stout an ACT relaxation technique today to aid in managing difficult thoughts, feelings, urges, and sensations, which she was agreeable to.  Clinician guided Paula Stout through process of getting comfortable, achieving relaxing breathing rhythm, and then maintaining this throughout activity.  Clinician invited Paula Stout to imagine a gently flowing stream in her mind with leaves floating upon it, and when any thoughts, feelings, or sensations arose, good or bad, she would visualize placing them on these passing leaves over course of 10 minutes practice.  Intervention was effective, as evidenced by Paula Stout participating in exercise successfully and reporting that she was able to place ruminating thoughts upon leaves and watch them pass out of sight, which left  her feeling relaxed, and calm afterward, stating "I know everything is going to be fine".  She reported that she would add this to  her self-care routine, and acknowledged that she would permanently be relocating to [place] following recent move with help of her son over the weekend.  Clinician revisited previous discussion with Paula Stout on discharge policy and inability to continue with therapy due to limitations of license coverage across state lines.  Paula Stout expressed understanding and reported that she has set up transition to new insurance provider, and will be outreaching new psychiatrist and therapist in following weeks to ensure continued care management.  Clinician encouraged Paula Stout to continue practice of coping skills learned from therapy, prioritize self-care, and be mindful of boundaries with supports as she adapts to new living space.  Clinician informed front desk of need to discharge client following relocation to [place].          Plan: Client will be discharged from care following move out of state.    Diagnosis: Generalized anxiety disorder; and Bipolar I Disorder, most recent episode depressed   [name], [place], LCAS 02/24/21

## 2021-03-04 ENCOUNTER — Ambulatory Visit: Payer: 59 | Admitting: Adult Health

## 2021-03-04 ENCOUNTER — Encounter: Payer: Self-pay | Admitting: Adult Health

## 2021-04-06 DIAGNOSIS — I10 Essential (primary) hypertension: Secondary | ICD-10-CM

## 2021-04-06 DIAGNOSIS — E782 Mixed hyperlipidemia: Secondary | ICD-10-CM | POA: Insufficient documentation

## 2021-04-06 HISTORY — DX: Essential (primary) hypertension: I10

## 2021-04-06 HISTORY — DX: Mixed hyperlipidemia: E78.2

## 2021-04-07 ENCOUNTER — Telehealth (HOSPITAL_COMMUNITY): Payer: 59 | Admitting: Psychiatry

## 2021-05-27 ENCOUNTER — Telehealth: Payer: Self-pay

## 2021-05-27 NOTE — Telephone Encounter (Signed)
Submitted PA on CMM.  [id number].  Waiting on determination from [place].

## 2021-05-28 ENCOUNTER — Encounter: Payer: Self-pay | Admitting: Neurology

## 2021-06-05 NOTE — Telephone Encounter (Signed)
Sam from [facility] called stating he had some questions about the [place]. REF KEY: [id number] call back number [phone].

## 2021-06-08 NOTE — Telephone Encounter (Signed)
Tried calling back, they do not open until 8am

## 2021-06-08 NOTE — Telephone Encounter (Addendum)
Called [facility]. Spoke with Edz A. She transferred me to [place]. Spoke with Cherise. States pt pharmacy benefit terminated on 03/05/21.  I LVM for pt to call office back to provide updated pharmacy benefit info to be able to proceed with PA request.

## 2021-06-08 NOTE — Telephone Encounter (Signed)
Called CMM back and spoke w/ Beverlee Nims. They received message back from [facility] that PA submitted came back showing pt does not have active coverage. She recommended we call plan directly.

## 2021-07-22 ENCOUNTER — Ambulatory Visit: Payer: 59 | Admitting: Cardiology

## 2021-07-31 DIAGNOSIS — I249 Acute ischemic heart disease, unspecified: Secondary | ICD-10-CM

## 2021-07-31 DIAGNOSIS — Z87898 Personal history of other specified conditions: Secondary | ICD-10-CM

## 2021-07-31 HISTORY — DX: Personal history of other specified conditions: Z87.898

## 2021-07-31 HISTORY — DX: Acute ischemic heart disease, unspecified: I24.9

## 2021-09-16 ENCOUNTER — Ambulatory Visit (INDEPENDENT_AMBULATORY_CARE_PROVIDER_SITE_OTHER): Payer: Managed Care, Other (non HMO) | Admitting: Cardiology

## 2021-09-16 ENCOUNTER — Other Ambulatory Visit: Payer: Self-pay

## 2021-09-16 ENCOUNTER — Telehealth: Payer: Self-pay

## 2021-09-16 ENCOUNTER — Encounter: Payer: Self-pay | Admitting: Cardiology

## 2021-09-16 ENCOUNTER — Encounter: Payer: Self-pay | Admitting: Family Medicine

## 2021-09-16 ENCOUNTER — Ambulatory Visit (INDEPENDENT_AMBULATORY_CARE_PROVIDER_SITE_OTHER): Payer: Managed Care, Other (non HMO) | Admitting: Family Medicine

## 2021-09-16 VITALS — BP 138/72 | HR 81 | Ht 66.0 in | Wt 215.0 lb

## 2021-09-16 VITALS — BP 122/72 | HR 89 | Temp 98.0°F | Ht 66.0 in | Wt 211.2 lb

## 2021-09-16 DIAGNOSIS — I10 Essential (primary) hypertension: Secondary | ICD-10-CM | POA: Diagnosis not present

## 2021-09-16 DIAGNOSIS — M25512 Pain in left shoulder: Secondary | ICD-10-CM | POA: Diagnosis not present

## 2021-09-16 DIAGNOSIS — G35 Multiple sclerosis: Secondary | ICD-10-CM | POA: Diagnosis not present

## 2021-09-16 DIAGNOSIS — F339 Major depressive disorder, recurrent, unspecified: Secondary | ICD-10-CM

## 2021-09-16 DIAGNOSIS — I480 Paroxysmal atrial fibrillation: Secondary | ICD-10-CM

## 2021-09-16 DIAGNOSIS — G35D Multiple sclerosis, unspecified: Secondary | ICD-10-CM

## 2021-09-16 DIAGNOSIS — E088 Diabetes mellitus due to underlying condition with unspecified complications: Secondary | ICD-10-CM

## 2021-09-16 DIAGNOSIS — E782 Mixed hyperlipidemia: Secondary | ICD-10-CM | POA: Diagnosis not present

## 2021-09-16 DIAGNOSIS — E669 Obesity, unspecified: Secondary | ICD-10-CM

## 2021-09-16 DIAGNOSIS — N183 Chronic kidney disease, stage 3 unspecified: Secondary | ICD-10-CM

## 2021-09-16 DIAGNOSIS — G40909 Epilepsy, unspecified, not intractable, without status epilepticus: Secondary | ICD-10-CM

## 2021-09-16 DIAGNOSIS — R0789 Other chest pain: Secondary | ICD-10-CM

## 2021-09-16 MED ORDER — TIZANIDINE HCL 4 MG PO TABS: 4.0000 mg | ORAL_TABLET | Freq: Four times a day (QID) | ORAL | 0 refills | Status: DC | PRN

## 2021-09-16 MED ORDER — METFORMIN HCL 1000 MG PO TABS: 1000.0000 mg | ORAL_TABLET | Freq: Two times a day (BID) | ORAL | 2 refills | Status: DC

## 2021-09-16 MED ORDER — FLECAINIDE ACETATE 50 MG PO TABS: 50.0000 mg | ORAL_TABLET | Freq: Two times a day (BID) | ORAL | 3 refills | Status: DC

## 2021-09-16 MED ORDER — APIXABAN 5 MG PO TABS: 5.0000 mg | ORAL_TABLET | Freq: Two times a day (BID) | ORAL | 3 refills | Status: DC

## 2021-09-16 MED ORDER — FENOFIBRATE 160 MG PO TABS: 160.0000 mg | ORAL_TABLET | Freq: Every day | ORAL | 3 refills | Status: DC

## 2021-09-16 MED ORDER — PREDNISONE 20 MG PO TABS: 40.0000 mg | ORAL_TABLET | Freq: Every day | ORAL | 0 refills | Status: AC

## 2021-09-16 MED ORDER — NITROGLYCERIN 0.4 MG SL SUBL: 0.4000 mg | SUBLINGUAL_TABLET | SUBLINGUAL | 11 refills | Status: DC | PRN

## 2021-09-16 MED ORDER — LURASIDONE HCL 120 MG PO TABS: 120.0000 mg | ORAL_TABLET | Freq: Every evening | ORAL | 2 refills | Status: DC

## 2021-09-16 NOTE — Progress Notes (Signed)
Musculoskeletal Exam  Patient: Paula Stout DOB: [birth year]  DOS: 09/16/2021  SUBJECTIVE:  Chief Complaint:   Chief Complaint  Patient presents with   Follow-up    Muscle pull    re-establishing today    Paula Stout is a 66 y.o.  female for evaluation and treatment of shoulder/side pain.   Onset:  3 days ago. Reached in back seat w L hand and felt a pop.  Location: L trap/shoulder region, L lower rib cage Character:  sharp  Progression of issue:  is unchanged Associated symptoms: bruising over shoulder area on L Treatment: to date has been acetaminophen.   Neurovascular symptoms: no  Past Medical History:  Diagnosis Date   A-fib ([place])    Abscess 02/03/2019   Acute cystitis without hematuria 05/14/2017   Last Assessment & Plan:  Formatting of this note might be different from the original. - suprapubic tenderness with symptoms of urinary frequency and urgency and subjective fevers (no fevers recorded on any visit). Recently passed a renal stone, US shows left hydronephrosis which is improving.  - UA negative8/24 but pt is clinically symptomatic - will repeat UA  - start pt on Microbid for 5 days   Adhesive capsulitis of left shoulder 12/16/2016   Anxiety    Asthma    Asthma without status asthmaticus 02/06/2021   At risk for falls 02/04/2019   Atrial fibrillation ([place]) 09/01/2014   Last Assessment & Plan:  Formatting of this note might be different from the original. A:  Chronic.  Sinus rhythm at this time.  States she was told this during admission at [facility] in the past. P:  On baby aspirin at home will continue.  Low dose metoprolol started.   Bipolar 1 disorder ([place]) 10/11/2013   Bipolar 1 disorder, depressed, moderate ([place]) 02/18/2020   Bipolar disorder ([place])    Cellulitis 02/03/2019   Chest pain 02/19/2020   Chronic anticoagulation 02/20/2020   Chronic atrial fibrillation ([place]) 02/18/2020   Chronic headache 10/11/2013   Chronic migraine without aura, intractable, without  status migrainosus 06/24/2017   Chronic pain of both shoulders 11/14/2015   Colon cancer screening 04/15/2016   Last Assessment & Plan:  Formatting of this note might be different from the original. Will schedule for colonoscopy   Depressed bipolar I disorder ([place]) 02/06/2021   Depression    Depression, recurrent ([place]) 02/18/2020   Diabetes mellitus ([place])    Diabetes mellitus due to underlying condition with unspecified complications ([place]) 0/09/7791   Diabetes mellitus type 2 in obese ([place]) 10/11/2013   Last Assessment & Plan:  Formatting of this note might be different from the original. A:  Diabetes Mellitus Type 2 without long term use of insulin P:  Hold Home metformin. Reg ISS.   Diabetes mellitus without complication ([place])    Dyslipidemia 02/20/2020   Epilepsy ([place]) 02/06/2021   GAD (generalized anxiety disorder) 02/18/2020   Heartburn 04/15/2016   Last Assessment & Plan:  Formatting of this note might be different from the original. Patient was counseled regarding lifestyle modification  Advised to take Prilosec 30 mins before breakfast  Will schedule for EGD   History of atrial fibrillation 06/24/2017   History of posttraumatic stress disorder (PTSD) 10/07/2016   HLD (hyperlipidemia) 09/01/2014   Last Assessment & Plan:  Formatting of this note might be different from the original. A: Chronic. P: Continue statin.   Hyperglycemia due to type 2 diabetes mellitus ([place]) 02/06/2021   Hyperosmolarity due to secondary diabetes mellitus ([place])  02/06/2021   Hypertriglyceridemia 02/18/2020   Impaired mobility and activities of daily living 11/14/2015   Insomnia 02/06/2021   Irritable bowel syndrome (IBS)    Kidney stone 02/06/2021   Left elbow pain 11/14/2015   Migraine without aura, not refractory 02/06/2021   Migraines 10/11/2013   Last Assessment & Plan:  Formatting of this note might be different from the original. A: Chronic. P: Continue Topamax. (also takes it for bipolar)   Morbid obesity ([place]) 12/16/2016    Multiple sclerosis ([place]) 06/24/2017   Obesity (BMI 30-39.9) 09/01/2014   Last Assessment & Plan:  Formatting of this note might be different from the original. A: Morbid obesity, with BMI of 35.0 - 39.9 P: Counseled to loose wt.   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 A: Pt has 2 months exertional angina, recently developed angina at rest intermittently.  She had a positive nuclear medicine cardiolyte stress test with anteroapical ischemia on stress portion not seen at rest. P:  - continue aspirin, statin, metoprolol - Discussed stress test findings with cardiologist Dr. Landis Gandy   Urinary tract obstruction 02/06/2021    Objective: VITAL SIGNS: BP 122/72    Pulse 89    Temp 98 F (36.7 C) (Oral)    Ht 5\' 6"  (1.676 m)    Wt 211 lb 4 oz (95.8 kg)    SpO2 98%    BMI 34.10 kg/m  Constitutional: Well formed, well developed. No acute distress. Thorax & Lungs: No accessory muscle use Musculoskeletal: L shoulder.   Normal active range of motion: no.   Normal passive range of motion: no Tenderness to palpation: yes over anterior deltoid/biceps tendon; also ttp over L lateral rib cage Deformity: no Ecchymosis: yes Tests positive: Speed's, Empty can, Neer's Tests negative: Cross over, lift off, Marnee Guarneri Neurologic: Normal sensory function. No focal deficits noted. DTR's equal and symmetric in UE's. No clonus. Psychiatric: Normal mood. Age appropriate judgment and insight. Alert & oriented x 3.    Assessment:  Acute pain of left  shoulder - Plan: predniSONE (DELTASONE) 20 MG tablet, tiZANidine (ZANAFLEX) 4 MG tablet  Left-sided chest wall pain - Plan: predniSONE (DELTASONE) 20 MG tablet, tiZANidine (ZANAFLEX) 4 MG tablet  Multiple sclerosis ([place]), Chronic  Depression, recurrent ([place]), Chronic  Diabetes mellitus due to underlying condition with unspecified complications ([place]), Chronic  Paroxysmal atrial fibrillation ([place]), Chronic  Morbid obesity ([place]), Chronic  Nonintractable epilepsy without status epilepticus, unspecified epilepsy type ([place]), Chronic  Plan: Stretches/exercises, heat, ice, Tylenol. PT if no better.  F/u in 1 mo for CPE. The patient voiced understanding and agreement to the plan.   [place], DO 09/16/21  10:20 AM

## 2021-09-16 NOTE — Progress Notes (Signed)
Cardiology Office Note:    Date:  09/16/2021   ID:  Paula Stout, DOB Jan 16, 1956, MRN [id number]  PCP:  [name], DO  Cardiologist:  Jenean Lindau, MD   Referring MD: [name]*    ASSESSMENT:    1. Primary hypertension   2. Paroxysmal atrial fibrillation (HCC)   3. Mixed hyperlipidemia   4. Diabetes mellitus due to underlying condition with unspecified complications ([place])   5. Obesity (BMI 30-39.9)   6. Stage 3 chronic kidney disease, unspecified whether stage 3a or 3b CKD ([place])    PLAN:    In order of problems listed above:  Primary prevention stressed with the patient.  Importance of compliance with diet medication stressed and she vocalized understanding. Paroxysmal atrial fibrillation:I discussed with the patient atrial fibrillation, disease process. Management and therapy including rate and rhythm control, anticoagulation benefits and potential risks were discussed extensively with the patient. Patient had multiple questions which were answered to patient's satisfaction. Mixed dyslipidemia: Lipids were reviewed and diet was emphasized.  She is on statin therapy. Diabetes mellitus and obesity: Weight reduction stressed diet was emphasized.  Lifestyle modification urged and she promises to do better.  She was advised to walk at least half an hour a day 5 days a week. Patient will be seen in follow-up appointment in 6 months or earlier if the patient has any concerns    Medication Adjustments/Labs and Tests Ordered: Current medicines are reviewed at length with the patient today.  Concerns regarding medicines are outlined above.  No orders of the defined types were placed in this encounter.  No orders of the defined types were placed in this encounter.    No chief complaint on file.    History of Present Illness:    Paula Stout is a 66 y.o. female.  Patient has past medical history of paroxysmal atrial fibrillation, essential  hypertension, mixed dyslipidemia and diabetes mellitus.  She denies any problems at this time and takes care of activities of daily living.  No chest pain orthopnea or PND.  She walks on a regular basis.  At the time of my evaluation, the patient is alert awake oriented and in no distress.  Past Medical History:  Diagnosis Date   A-fib [facility])    Abscess 02/03/2019   Acute cystitis without hematuria 05/14/2017   Last Assessment & Plan:  Formatting of this note might be different from the original. - suprapubic tenderness with symptoms of urinary frequency and urgency and subjective fevers (no fevers recorded on any visit). Recently passed a renal stone, US shows left hydronephrosis which is improving.  - UA negative8/24 but pt is clinically symptomatic - will repeat UA  - start pt on Microbid for 5 days   Adhesive capsulitis of left shoulder 12/16/2016   Anxiety    Asthma    Asthma without status asthmaticus 02/06/2021   At risk for falls 02/04/2019   Atrial fibrillation ([place]) 09/01/2014   Last Assessment & Plan:  Formatting of this note might be different from the original. A:  Chronic.  Sinus rhythm at this time.  States she was told this during admission at [facility] in the past. P:  On baby aspirin at home will continue.  Low dose metoprolol started.   Bipolar 1 disorder ([place]) 10/11/2013   Bipolar 1 disorder, depressed, moderate ([place]) 02/18/2020   Bipolar disorder ([place])    Cellulitis 02/03/2019   Chest pain 02/19/2020   Chronic anticoagulation 02/20/2020   Chronic  atrial fibrillation (HCC) 02/18/2020   Chronic headache 10/11/2013   Chronic migraine without aura, intractable, without status migrainosus 06/24/2017   Chronic pain of both shoulders 11/14/2015   Colon cancer screening 04/15/2016   Last Assessment & Plan:  Formatting of this note might be different from the original. Will schedule for colonoscopy   Depressed bipolar I disorder ([place]) 02/06/2021   Depression    Depression, recurrent ([place])  02/18/2020   Diabetes mellitus ([place])    Diabetes mellitus due to underlying condition with unspecified complications ([place]) 05/15/8337   Diabetes mellitus type 2 in obese ([place]) 10/11/2013   Last Assessment & Plan:  Formatting of this note might be different from the original. A:  Diabetes Mellitus Type 2 without long term use of insulin P:  Hold Home metformin. Reg ISS.   Diabetes mellitus without complication ([place])    Dyslipidemia 02/20/2020   Epilepsy ([place]) 02/06/2021   GAD (generalized anxiety disorder) 02/18/2020   Heartburn 04/15/2016   Last Assessment & Plan:  Formatting of this note might be different from the original. Patient was counseled regarding lifestyle modification  Advised to take Prilosec 30 mins before breakfast  Will schedule for EGD   History of atrial fibrillation 06/24/2017   History of posttraumatic stress disorder (PTSD) 10/07/2016   HLD (hyperlipidemia) 09/01/2014   Last Assessment & Plan:  Formatting of this note might be different from the original. A: Chronic. P: Continue statin.   Hyperglycemia due to type 2 diabetes mellitus ([place]) 02/06/2021   Hyperosmolarity due to secondary diabetes mellitus ([place]) 02/06/2021   Hypertriglyceridemia 02/18/2020   Impaired mobility and activities of daily living 11/14/2015   Insomnia 02/06/2021   Irritable bowel syndrome (IBS)    Kidney stone 02/06/2021   Left elbow pain 11/14/2015   Migraine without aura, not refractory 02/06/2021   Migraines 10/11/2013   Last Assessment & Plan:  Formatting of this note might be different from the original. A: Chronic. P: Continue Topamax. (also takes it for bipolar)   Morbid obesity ([place]) 12/16/2016   Multiple sclerosis ([place]) 06/24/2017   Obesity (BMI 30-39.9) 09/01/2014   Last Assessment & Plan:  Formatting of this note might be different from the original. A: Morbid obesity, with BMI of 35.0 - 39.9 P: Counseled to loose wt.   Paroxysmal atrial fibrillation (HCC) 04/14/2020   Polyneuropathy due to type 2 diabetes  mellitus ([place]) 02/06/2021   Refractory migraine with aura 02/06/2021   Refractory migraine without aura 10/11/537   Renal colic on left side 7/67/3419   Seizure disorder ([place]) 06/24/2017   Sleep apnea 02/06/2021   Sleep-disordered breathing 06/05/2018   Stage 3 chronic kidney disease ([place]) 02/20/2020   Unstable angina ([place]) 09/01/2014   Last Assessment & Plan:  Formatting of this note might be different from the original. A: Pt has 2 months exertional angina, recently developed angina at rest intermittently.  She had a positive nuclear medicine cardiolyte stress test with anteroapical ischemia on stress portion not seen at rest. P:  - continue aspirin, statin, metoprolol - Discussed stress test findings with cardiologist Dr. Landis Gandy   Urinary tract obstruction 02/06/2021    Past Surgical History:  Procedure Laterality Date   ABDOMINAL HYSTERECTOMY     Fibroids   ATRIAL FIBRILLATION ABLATION     CARDIAC CATHETERIZATION     CESAREAN SECTION     KNEE SURGERY     LEFT HEART CATH AND CORONARY ANGIOGRAPHY N/A 03/04/2020   Procedure: LEFT HEART CATH AND CORONARY ANGIOGRAPHY;  Surgeon: Belva Crome, MD;  Location: [facility];  Service: Cardiovascular;  Laterality: N/A;   ROTATOR CUFF REPAIR      Current Medications: Current Meds  Medication Sig   albuterol (VENTOLIN HFA) 108 (90 Base) MCG/ACT inhaler Inhale 2 puffs into the lungs every 4 (four) hours as needed for wheezing.   apixaban (ELIQUIS) 5 MG TABS tablet Take 5 mg by mouth 2 (two) times daily.   atorvastatin (LIPITOR) 80 MG tablet Take 1 tablet (80 mg total) by mouth daily.   buPROPion (WELLBUTRIN XL) 300 MG 24 hr tablet Take 1 tablet (300 mg total) by mouth daily.   EPINEPHrine 0.3 mg/0.3 mL IJ SOAJ injection Inject 0.3 mg into the muscle as needed for anaphylaxis.   fenofibrate 160 MG tablet Take 160 mg by mouth daily.   flecainide (TAMBOCOR) 50 MG tablet Take 1 tablet (50 mg total) by mouth 2 (two) times daily.   Galcanezumab-gnlm  (EMGALITY) 120 MG/ML SOAJ Inject 120 mg into the skin every 30 (thirty) days.   isosorbide mononitrate (IMDUR) 30 MG 24 hr tablet Take 0.5 tablets (15 mg total) by mouth daily.   levocetirizine (XYZAL) 5 MG tablet Take 5 mg by mouth every evening.   Lurasidone HCl 120 MG TABS Take 1 tablet (120 mg total) by mouth at bedtime.   metFORMIN (GLUCOPHAGE) 1000 MG tablet Take 1 tablet (1,000 mg total) by mouth 2 (two) times daily with a meal.   metoprolol succinate (TOPROL-XL) 50 MG 24 hr tablet Take 1 tablet (50 mg total) by mouth daily. Take with or immediately following a meal.   nitroGLYCERIN (NITROSTAT) 0.4 MG SL tablet Place 0.4 mg under the tongue every 5 (five) minutes as needed for chest pain.   ondansetron (ZOFRAN-ODT) 4 MG disintegrating tablet Take 1 tablet (4 mg total) by mouth every 8 (eight) hours as needed for nausea.   pantoprazole (PROTONIX) 40 MG tablet Take 40 mg by mouth daily.   predniSONE (DELTASONE) 20 MG tablet Take 2 tablets (40 mg total) by mouth daily with breakfast for 5 days.   QUEtiapine (SEROQUEL) 300 MG tablet Take 1 tablet (300 mg total) by mouth at bedtime.   tiZANidine (ZANAFLEX) 4 MG tablet Take 1 tablet (4 mg total) by mouth every 6 (six) hours as needed for muscle spasms.   topiramate (TOPAMAX) 50 MG tablet Take 150 mg by mouth 2 (two) times daily.   TRULICITY 1.5 EP/3.2RJ SOPN SMARTSIG:1 Pre-Filled Pen Syringe SUB-Q Once a Week   Ubrogepant 100 MG TABS Take 100 mg by mouth as needed for migraine.     Allergies:   Bee venom, Onion, Tetanus-diphtheria toxoids td, Sumatriptan, Asa [aspirin], Nsaids, Iodine, Sulfa antibiotics, and Vancomycin   Social History   Socioeconomic History   Marital status: Widowed    Spouse name: Not on file   Number of children: 6   Years of education: Not on file   Highest education level: Not on file  Occupational History   Not on file  Tobacco Use   Smoking status: Never   Smokeless tobacco: Never  Vaping Use   Vaping Use:  Never used  Substance and Sexual Activity   Alcohol use: Never   Drug use: Never   Sexual activity: Not on file  Other Topics Concern   Not on file  Social History Narrative   Not on file   Social Determinants of Health   Financial Resource Strain: Not on file  Food Insecurity: Not on file  Transportation Needs: Not on file  Physical Activity: Not on file  Stress: Not on file  Social Connections: Not on file     Family History: The patient's family history includes Bipolar disorder in her daughter and son. She was adopted.  ROS:   Please see the history of present illness.    All other systems reviewed and are negative.  EKGs/Labs/Other Studies Reviewed:    The following studies were reviewed today: EKG reveals sinus rhythm and nonspecific ST-T changes   Recent Labs: 02/10/2021: ALT 20; BUN 18; Creatinine, Ser 1.63; Hemoglobin 11.7; Platelets 311; Potassium 4.6; Sodium 140; TSH 1.500  Recent Lipid Panel    Component Value Date/Time   CHOL 141 02/10/2021 0914   TRIG 166 (H) 02/10/2021 0914   HDL 26 (L) 02/10/2021 0914   CHOLHDL 5.4 (H) 02/10/2021 0914   CHOLHDL 6 10/13/2020 0856   VLDL 39.4 10/13/2020 0856   LDLCALC 86 02/10/2021 0914    Physical Exam:    VS:  BP 138/72    Pulse 81    Ht 5\' 6"  (1.676 m)    Wt 215 lb 0.6 oz (97.5 kg)    SpO2 98%    BMI 34.71 kg/m     Wt Readings from Last 3 Encounters:  09/16/21 215 lb 0.6 oz (97.5 kg)  09/16/21 211 lb 4 oz (95.8 kg)  02/06/21 232 lb (105.2 kg)     GEN: Patient is in no acute distress HEENT: Normal NECK: No JVD; No carotid bruits LYMPHATICS: No lymphadenopathy CARDIAC: Hear sounds regular, 2/6 systolic murmur at the apex. RESPIRATORY:  Clear to auscultation without rales, wheezing or rhonchi  ABDOMEN: Soft, non-tender, non-distended MUSCULOSKELETAL:  No edema; No deformity  SKIN: Warm and dry NEUROLOGIC:  Alert and oriented x 3 PSYCHIATRIC:  Normal affect   Signed, Jenean Lindau, MD  09/16/2021  4:15 PM    [facility]

## 2021-09-16 NOTE — Telephone Encounter (Signed)
PA initiated via Covermymeds; KEY: [id number]. PA approved.   [id number];JFTNBZ:[id number];Review Type:Prior Auth;Coverage Start Date:09/16/2021;Coverage End Date:09/16/2022;

## 2021-09-16 NOTE — Patient Instructions (Signed)

## 2021-09-16 NOTE — Patient Instructions (Signed)
Heat (pad or rice pillow in microwave) over affected area, 10-15 minutes twice daily.   OK to take Tylenol 1000 mg (2 extra strength tabs) or 975 mg (3 regular strength tabs) every 6 hours as needed.  Ice/cold pack over area for 10-15 min twice daily.  Let us know if you need anything.  Biceps Tendon Disruption (Proximal) Rehab Do exercises exactly as told by your health care provider and adjust them as directed. It is normal to feel mild stretching, pulling, tightness, or discomfort as you do these exercises, but you should stop right away if you feel sudden pain or your pain gets worse.  Stretching and range of motion exercises These exercises warm up your muscles and joints and improve the movement and flexibility of your arm and shoulder. These exercises also help to relieve pain and stiffness. Exercise A: Shoulder flexion, standing   Stand facing a wall. Put your left / right hand on the wall. Slide your left / right hand up the wall. Stop when you feel a stretch in your shoulder, or when you reach the angle recommended by your health care provider. Use your other hand to help raise your arm, if needed. As your hand gets higher, you may need to step closer to the wall. Avoid shrugging your shoulder while you raise your arm. To do this, keep your shoulder blade tucked down toward your spine. Hold for 30 seconds. Slowly return to the starting position. Use your other arm to help, if needed. Repeat 2 times. Complete this exercise 3 times per week. Exercise B: Pendulum   Stand near a wall or a surface that you can hold onto for balance. Bend at the waist and let your left / right arm hang straight down. Use your other arm to support you. Relax your arm and shoulder muscles, and move your hips and your trunk so your left / right arm swings freely. Your arm should swing because of the motion of your body, not because you are using your arm or shoulder muscles. Keep moving so your arm swings  in the following directions, as told by your health care provider: Side to side. Forward and backward. In clockwise and counterclockwise circles. Slowly return to the starting position. Repeat 2 times. Complete this exercise 3 times per week.  Strengthening exercises These exercises build strength and endurance in your arm and shoulder. Endurance is the ability to use your muscles for a long time, even after your muscles get tired. Exercise C: Elbow flexion, neutral  Sit on a stable chair without armrests, or stand. Hold a 3-5 lb weight in your left / right hand, or hold an exercise band with both hands. Your palms should face each other at the starting position. Bend your left / right elbow and move your hand up toward your shoulder. Lead with your thumb, and keep your palm facing the same direction. Keep your other arm straight down, in the starting position. Slowly return to the starting position. Repeat 2-3 times. Complete this exercise 3 times per week. Exercise D: Forearm supination   Sit with your left / right forearm on a table. Your elbow should be below shoulder height. Rest your hand over the edge of the table so your palm faces down. If directed, hold a hammer with your left / right hand. Without moving your elbow, slowly rotate your hand so your palm faces up toward the ceiling. If you are holding a hammer, begin by holding the hammer near the  head. When this exercise gets easier for you, hold the hammer farther down the handle. Hold for 3 seconds. Slowly return to the starting position. Repeat 2 times. Complete this exercise 3 times per week. Exercise E: Scapular retraction   Sit in a stable chair without armrests, or stand. Secure an exercise band to a stable object in front of you so the band is at shoulder height. Hold one end of the exercise band in each hand. Squeeze your shoulder blades together and move your elbows slightly behind you. Do not shrug your  shoulders. Hold for 3 seconds. Slowly return to the starting position. Repeat 2 times. Complete this exercise 3 times per week. Exercise F: Scapular protraction, supine   Lie on your back on a firm surface. Hold a 3-5 lb weight in your left / right hand. Raise your left / right arm straight into the air so your hand is directly above your shoulder joint. Push the weight into the air so your shoulder lifts off of the surface that you are lying on. Do not move your head, neck, or back. Hold for 3 seconds. Slowly return to the starting position. Let your muscles relax completely before you repeat this exercise. Repeat 2 times. Complete this exercise 3 times per week. This information is not intended to replace advice given to you by your health care provider. Make sure you discuss any questions you have with your health care provider. Document Released: 08/23/2005 Document Revised: 04/29/2016 Document Reviewed: 08/01/2015 Elsevier Interactive Patient Education  2017 [employer].

## 2021-09-22 ENCOUNTER — Other Ambulatory Visit (HOSPITAL_COMMUNITY): Payer: Self-pay

## 2021-09-22 ENCOUNTER — Other Ambulatory Visit: Payer: Self-pay | Admitting: Family Medicine

## 2021-09-22 ENCOUNTER — Encounter: Payer: Self-pay | Admitting: Family Medicine

## 2021-09-22 ENCOUNTER — Other Ambulatory Visit: Payer: Self-pay

## 2021-09-22 DIAGNOSIS — I480 Paroxysmal atrial fibrillation: Secondary | ICD-10-CM

## 2021-09-22 MED ORDER — FENOFIBRATE 160 MG PO TABS: 160.0000 mg | ORAL_TABLET | Freq: Every day | ORAL | 3 refills | Status: DC

## 2021-09-22 MED ORDER — TRULICITY 1.5 MG/0.5ML ~~LOC~~ SOAJ
1.5000 mg | SUBCUTANEOUS | 2 refills | Status: DC
  Filled 2021-09-22: qty 2, 28d supply, fill #0

## 2021-09-22 MED ORDER — TRULICITY 1.5 MG/0.5ML ~~LOC~~ SOAJ: 1.5000 mg | SUBCUTANEOUS | 2 refills | Status: DC

## 2021-09-22 MED ORDER — FREESTYLE TEST VI STRP: ORAL_STRIP | 3 refills | Status: DC

## 2021-09-22 MED ORDER — ISOSORBIDE MONONITRATE ER 30 MG PO TB24: 15.0000 mg | ORAL_TABLET | Freq: Every day | ORAL | 1 refills | Status: DC

## 2021-09-22 MED ORDER — TOPIRAMATE 50 MG PO TABS: 150.0000 mg | ORAL_TABLET | Freq: Two times a day (BID) | ORAL | 0 refills | Status: DC

## 2021-09-22 MED ORDER — METFORMIN HCL 1000 MG PO TABS: 1000.0000 mg | ORAL_TABLET | Freq: Two times a day (BID) | ORAL | 2 refills | Status: DC

## 2021-09-22 MED ORDER — FLECAINIDE ACETATE 50 MG PO TABS: 50.0000 mg | ORAL_TABLET | Freq: Two times a day (BID) | ORAL | 3 refills | Status: DC

## 2021-09-22 NOTE — Telephone Encounter (Signed)
Request for new Rx be sent to patients mail order pharmacy [facility] for Flecainide and Fenofibrate

## 2021-10-01 ENCOUNTER — Encounter: Payer: Self-pay | Admitting: Cardiology

## 2021-10-01 ENCOUNTER — Encounter: Payer: Self-pay | Admitting: Family Medicine

## 2021-10-01 ENCOUNTER — Telehealth: Payer: Self-pay

## 2021-10-01 MED ORDER — BLOOD GLUCOSE METER KIT: PACK | 0 refills | Status: DC

## 2021-10-01 NOTE — Telephone Encounter (Signed)
PA approved from 10/01/21 until 10/01/22 for Eliquis 5 mg.  I have sent the patient a message via My Chart to inform her of this.

## 2021-10-01 NOTE — Telephone Encounter (Signed)
PA started on Eliquis 5 mg. Key [id number]

## 2021-10-02 ENCOUNTER — Ambulatory Visit: Payer: Managed Care, Other (non HMO) | Admitting: Family Medicine

## 2021-10-02 ENCOUNTER — Other Ambulatory Visit: Payer: Self-pay

## 2021-10-02 ENCOUNTER — Other Ambulatory Visit: Payer: Self-pay | Admitting: Internal Medicine

## 2021-10-02 ENCOUNTER — Encounter (HOSPITAL_BASED_OUTPATIENT_CLINIC_OR_DEPARTMENT_OTHER): Payer: Self-pay

## 2021-10-02 ENCOUNTER — Emergency Department (HOSPITAL_BASED_OUTPATIENT_CLINIC_OR_DEPARTMENT_OTHER)
Admission: EM | Admit: 2021-10-02 | Discharge: 2021-10-03 | Disposition: A | Discharge status: Home / Self Care | Payer: Commercial Managed Care - HMO | Attending: Emergency Medicine | Admitting: Emergency Medicine

## 2021-10-02 ENCOUNTER — Encounter: Payer: Self-pay | Admitting: Family Medicine

## 2021-10-02 DIAGNOSIS — Z7951 Long term (current) use of inhaled steroids: Secondary | ICD-10-CM | POA: Diagnosis not present

## 2021-10-02 DIAGNOSIS — Z7984 Long term (current) use of oral hypoglycemic drugs: Secondary | ICD-10-CM | POA: Insufficient documentation

## 2021-10-02 DIAGNOSIS — I480 Paroxysmal atrial fibrillation: Secondary | ICD-10-CM | POA: Insufficient documentation

## 2021-10-02 DIAGNOSIS — E1122 Type 2 diabetes mellitus with diabetic chronic kidney disease: Secondary | ICD-10-CM | POA: Diagnosis not present

## 2021-10-02 DIAGNOSIS — J45909 Unspecified asthma, uncomplicated: Secondary | ICD-10-CM | POA: Insufficient documentation

## 2021-10-02 DIAGNOSIS — D649 Anemia, unspecified: Secondary | ICD-10-CM | POA: Insufficient documentation

## 2021-10-02 DIAGNOSIS — E1142 Type 2 diabetes mellitus with diabetic polyneuropathy: Secondary | ICD-10-CM | POA: Diagnosis not present

## 2021-10-02 DIAGNOSIS — M25512 Pain in left shoulder: Secondary | ICD-10-CM

## 2021-10-02 DIAGNOSIS — Z79899 Other long term (current) drug therapy: Secondary | ICD-10-CM | POA: Diagnosis not present

## 2021-10-02 DIAGNOSIS — N183 Chronic kidney disease, stage 3 unspecified: Secondary | ICD-10-CM | POA: Insufficient documentation

## 2021-10-02 DIAGNOSIS — I951 Orthostatic hypotension: Secondary | ICD-10-CM | POA: Insufficient documentation

## 2021-10-02 DIAGNOSIS — Z7901 Long term (current) use of anticoagulants: Secondary | ICD-10-CM | POA: Diagnosis not present

## 2021-10-02 DIAGNOSIS — G43801 Other migraine, not intractable, with status migrainosus: Secondary | ICD-10-CM | POA: Diagnosis not present

## 2021-10-02 DIAGNOSIS — R519 Headache, unspecified: Secondary | ICD-10-CM | POA: Diagnosis present

## 2021-10-02 DIAGNOSIS — R0789 Other chest pain: Secondary | ICD-10-CM

## 2021-10-02 LAB — CBC WITH DIFFERENTIAL/PLATELET
Abs Immature Granulocytes: 0.02 10*3/uL (ref 0.00–0.07)
Basophils Absolute: 0.1 10*3/uL (ref 0.0–0.1)
Basophils Relative: 1 %
Eosinophils Absolute: 0.4 10*3/uL (ref 0.0–0.5)
Eosinophils Relative: 5 %
HCT: 32.2 % — ABNORMAL LOW (ref 36.0–46.0)
Hemoglobin: 10.7 g/dL — ABNORMAL LOW (ref 12.0–15.0)
Immature Granulocytes: 0 %
Lymphocytes Relative: 24 %
Lymphs Abs: 2.1 10*3/uL (ref 0.7–4.0)
MCH: 32 pg (ref 26.0–34.0)
MCHC: 33.2 g/dL (ref 30.0–36.0)
MCV: 96.4 fL (ref 80.0–100.0)
Monocytes Absolute: 0.4 10*3/uL (ref 0.1–1.0)
Monocytes Relative: 5 %
Neutro Abs: 5.7 10*3/uL (ref 1.7–7.7)
Neutrophils Relative %: 65 %
Platelets: 279 10*3/uL (ref 150–400)
RBC: 3.34 MIL/uL — ABNORMAL LOW (ref 3.87–5.11)
RDW: 14.3 % (ref 11.5–15.5)
WBC: 8.7 10*3/uL (ref 4.0–10.5)
nRBC: 0 % (ref 0.0–0.2)

## 2021-10-02 LAB — COMPREHENSIVE METABOLIC PANEL
ALT: 18 U/L (ref 0–44)
AST: 20 U/L (ref 15–41)
Albumin: 3.1 g/dL — ABNORMAL LOW (ref 3.5–5.0)
Alkaline Phosphatase: 69 U/L (ref 38–126)
Anion gap: 9 (ref 5–15)
BUN: 22 mg/dL (ref 8–23)
CO2: 18 mmol/L — ABNORMAL LOW (ref 22–32)
Calcium: 9 mg/dL (ref 8.9–10.3)
Chloride: 112 mmol/L — ABNORMAL HIGH (ref 98–111)
Creatinine, Ser: 1.91 mg/dL — ABNORMAL HIGH (ref 0.44–1.00)
GFR, Estimated: 29 mL/min — ABNORMAL LOW (ref >60)
Glucose, Bld: 217 mg/dL — ABNORMAL HIGH (ref 70–99)
Potassium: 4 mmol/L (ref 3.5–5.1)
Sodium: 139 mmol/L (ref 135–145)
Total Bilirubin: 0.4 mg/dL (ref 0.3–1.2)
Total Protein: 6.1 g/dL — ABNORMAL LOW (ref 6.5–8.1)

## 2021-10-02 LAB — LACTIC ACID, PLASMA: Lactic Acid, Venous: 2.3 mmol/L (ref 0.5–1.9)

## 2021-10-02 MED ORDER — LACTATED RINGERS IV BOLUS
1000.0000 mL | Freq: Once | INTRAVENOUS | Status: AC
  Administered 2021-10-02: 1000 mL via INTRAVENOUS

## 2021-10-02 MED ORDER — HYDROMORPHONE HCL 1 MG/ML IJ SOLN
1.0000 mg | Freq: Once | INTRAMUSCULAR | Status: DC
  Filled 2021-10-02: qty 1

## 2021-10-02 MED ORDER — SODIUM CHLORIDE 0.9 % IV BOLUS
1000.0000 mL | Freq: Once | INTRAVENOUS | Status: AC
  Administered 2021-10-02: 1000 mL via INTRAVENOUS

## 2021-10-02 MED ORDER — ONDANSETRON HCL 4 MG/2ML IJ SOLN
4.0000 mg | Freq: Once | INTRAMUSCULAR | Status: AC
Start: 2021-10-02 — End: 2021-10-02
  Administered 2021-10-02: 4 mg via INTRAVENOUS
  Filled 2021-10-02 (×2): qty 2

## 2021-10-02 MED ORDER — TIZANIDINE HCL 4 MG PO TABS: 4.0000 mg | ORAL_TABLET | Freq: Four times a day (QID) | ORAL | 0 refills | Status: DC | PRN

## 2021-10-02 MED ORDER — KETOROLAC TROMETHAMINE 15 MG/ML IJ SOLN
15.0000 mg | Freq: Once | INTRAMUSCULAR | Status: AC
  Administered 2021-10-02: 15 mg via INTRAVENOUS
  Filled 2021-10-02: qty 1

## 2021-10-02 MED ORDER — DIPHENHYDRAMINE HCL 50 MG/ML IJ SOLN
12.5000 mg | Freq: Once | INTRAMUSCULAR | Status: AC
  Administered 2021-10-02: 12.5 mg via INTRAVENOUS
  Filled 2021-10-02 (×2): qty 1

## 2021-10-02 NOTE — ED Provider Notes (Signed)
[facility] Provider Note   CSN: [id number] Arrival date & time: 10/02/21  2004     History  Chief Complaint  Patient presents with   Migraine    Paula Stout is a 66 y.o. female.  66 year old female presents today for evaluation of migraine headache that she has had for the past 2 days.  Patient does have history of migraine and takes [place] as needed for migraine.  Patient reports she is taking about 4 doses over the past 2 days without significant relief.  She reports this headache is similar to her previous migraine headaches.  Rates her pain as a 8 out of 10.  Endorses photophobia, phonophobia.  Denies fever, chills, visual change, disequilibrium.  Denies recent illness.  The history is provided by the patient. No language interpreter was used.      Home Medications Prior to Admission medications   Medication Sig Start Date End Date Taking? Authorizing Provider  albuterol (VENTOLIN HFA) 108 (90 Base) MCG/ACT inhaler Inhale 2 puffs into the lungs every 4 (four) hours as needed for wheezing. 06/23/20   Wendling, Crosby Oyster, DO  apixaban (ELIQUIS) 5 MG TABS tablet Take 1 tablet (5 mg total) by mouth 2 (two) times daily. 09/16/21   Revankar, Reita Cliche, MD  atorvastatin (LIPITOR) 80 MG tablet Take 1 tablet (80 mg total) by mouth daily. 04/23/20   [name], DO  blood glucose meter kit and supplies One Touch Ultra  Check blood sugars once daily Dx: E11.9 10/01/21   [name], DO  buPROPion (WELLBUTRIN XL) 300 MG 24 hr tablet Take 1 tablet (300 mg total) by mouth daily. 10/08/20   Pucilowski, Olgierd A, MD  EPINEPHrine 0.3 mg/0.3 mL IJ SOAJ injection Inject 0.3 mg into the muscle as needed for anaphylaxis. 11/17/20   [name], DO  fenofibrate 160 MG tablet Take 1 tablet (160 mg total) by mouth daily. 09/22/21   Revankar, Reita Cliche, MD  flecainide (TAMBOCOR) 50 MG tablet Take 1 tablet (50 mg total) by mouth 2 (two)  times daily. 09/22/21   Revankar, Reita Cliche, MD  Galcanezumab-gnlm (EMGALITY) 120 MG/ML SOAJ Inject 120 mg into the skin every 30 (thirty) days.    [provider]  isosorbide mononitrate (IMDUR) 30 MG 24 hr tablet Take 0.5 tablets (15 mg total) by mouth daily. 09/22/21   Camnitz, Will Hassell Done, MD  levocetirizine (XYZAL) 5 MG tablet Take 5 mg by mouth every evening.    [provider]  Lurasidone HCl 120 MG TABS Take 1 tablet (120 mg total) by mouth at bedtime. 09/16/21   [name], DO  metFORMIN (GLUCOPHAGE) 1000 MG tablet Take 1 tablet (1,000 mg total) by mouth 2 (two) times daily with a meal. 09/22/21   Wendling, Crosby Oyster, DO  metoprolol succinate (TOPROL-XL) 50 MG 24 hr tablet Take 1 tablet (50 mg total) by mouth daily. Take with or immediately following a meal. 04/04/20   Wendling, Crosby Oyster, DO  nitroGLYCERIN (NITROSTAT) 0.4 MG SL tablet Place 1 tablet (0.4 mg total) under the tongue every 5 (five) minutes as needed for chest pain. 09/16/21   Revankar, Reita Cliche, MD  ondansetron (ZOFRAN-ODT) 4 MG disintegrating tablet Take 1 tablet (4 mg total) by mouth every 8 (eight) hours as needed for nausea. 02/18/20   [name], DO  pantoprazole (PROTONIX) 40 MG tablet Take 40 mg by mouth daily.    [provider]  QUEtiapine (SEROQUEL) 300 MG tablet  Take 1 tablet (300 mg total) by mouth at bedtime. 10/08/20   Pucilowski, Marchia Bond, MD  tiZANidine (ZANAFLEX) 4 MG tablet Take 1 tablet (4 mg total) by mouth every 6 (six) hours as needed for muscle spasms. 10/02/21   Colon Branch, MD  topiramate (TOPAMAX) 50 MG tablet Take 3 tablets (150 mg total) by mouth 2 (two) times daily. 09/22/21   Wendling, Crosby Oyster, DO  TRULICITY 1.5 MG/8.6PY SOPN Inject 1.5 mg into the skin as directed once a week. 09/22/21   [name], DO  Ubrogepant 100 MG TABS Take 100 mg by mouth as needed for migraine.    [provider]      Allergies    Bee venom,  Onion, Tetanus-diphtheria toxoids td, Sumatriptan, Asa [aspirin], Nsaids, Iodine, Sulfa antibiotics, and Vancomycin    Review of Systems   Review of Systems  Constitutional:  Negative for activity change, chills and fever.  Eyes:  Positive for photophobia. Negative for visual disturbance.  Respiratory:  Negative for shortness of breath.   Cardiovascular:  Negative for chest pain.  Musculoskeletal:  Negative for neck pain.  Neurological:  Positive for headaches. Negative for dizziness, speech difficulty, weakness, light-headedness and numbness.  All other systems reviewed and are negative.  Physical Exam Updated Vital Signs BP 92/62    Pulse 65    Temp 98.2 F (36.8 C) (Oral)    Resp 14    Ht '5\' 6"'  (1.676 m)    Wt 98.8 kg    SpO2 97%    BMI 35.16 kg/m  Physical Exam Vitals and nursing note reviewed.  Constitutional:      General: She is not in acute distress.    Appearance: Normal appearance. She is not ill-appearing.  HENT:     Head: Normocephalic and atraumatic.     Nose: Nose normal.  Eyes:     General: No scleral icterus.    Extraocular Movements: Extraocular movements intact.     Conjunctiva/sclera: Conjunctivae normal.  Cardiovascular:     Rate and Rhythm: Normal rate and regular rhythm.     Pulses: Normal pulses.     Heart sounds: Normal heart sounds.  Pulmonary:     Effort: Pulmonary effort is normal. No respiratory distress.     Breath sounds: Normal breath sounds. No wheezing or rales.  Abdominal:     General: There is no distension.     Tenderness: There is no abdominal tenderness.  Musculoskeletal:        General: Normal range of motion.     Cervical back: Normal range of motion.  Skin:    General: Skin is warm and dry.  Neurological:     General: No focal deficit present.     Mental Status: She is alert and oriented to person, place, and time. Mental status is at baseline.     Cranial Nerves: No cranial nerve deficit.     Sensory: No sensory deficit.      Motor: No weakness.     Comments: Strength in bilateral upper extremities 5/5.  Without facial droop.  Bilateral lower extremity strength 5/5.  Sensation in bilateral lower extremities intact.  Without pronator drift.  Pupils equal round and reactive.    ED Results / Procedures / Treatments   Labs (all labs ordered are listed, but only abnormal results are displayed) Labs Reviewed  CBC WITH DIFFERENTIAL/PLATELET - Abnormal; Notable for the following components:      Result Value   RBC 3.34 (*)  Hemoglobin 10.7 (*)    HCT 32.2 (*)    All other components within normal limits  COMPREHENSIVE METABOLIC PANEL    EKG None  Radiology No results found.  Procedures Procedures    Medications Ordered in ED Medications  sodium chloride 0.9 % bolus 1,000 mL (0 mLs Intravenous Stopped 10/02/21 2227)  ondansetron (ZOFRAN) injection 4 mg (4 mg Intravenous Given 10/02/21 2224)  diphenhydrAMINE (BENADRYL) injection 12.5 mg (12.5 mg Intravenous Given 10/02/21 2224)  ketorolac (TORADOL) 15 MG/ML injection 15 mg (15 mg Intravenous Given 10/02/21 2145)  sodium chloride 0.9 % bolus 1,000 mL (1,000 mLs Intravenous New Bag/Given 10/02/21 2227)    ED Course/ Medical Decision Making/ A&P Clinical Course as of 10/02/21 2300  Fri Oct 02, 2021  2135 Patient prior to receiving migraine cocktail became hypotensive.  On manual pressure checked by me it measured 86/46. patient's only new complaint is fatigue.  We will give IV hydration and obtain basic lab work. [AA]  2222 Patient following Toradol reports improvement in headache to a level of 6 from 9.  She also reports improvement in her fatigue that she developed earlier.  Blood pressure currently 95/63 with a MAP of 71.  We will give an additional bolus, provide a dose of Benadryl and Zofran that was held earlier. [AA]  2257 Orthostatics obtained on patient.  Patient lying down her blood pressure systolic of 16X and with standing for about 2 minutes  patient's systolic dropped into the 70s and patient became lightheaded requiring her to sit.  We will provide with lactated Ringer's and reassess orthostasis. [AA]    Clinical Course User Index [AA] Evlyn Courier, PA-C                           Medical Decision Making Amount and/or Complexity of Data Reviewed Labs: ordered.  Risk Prescription drug management.   Medical Decision Making / ED Course   This patient presents to the ED for concern of headache, this involves an extensive number of treatment options, and is a complaint that carries with it a high risk of complications and morbidity.  The differential diagnosis includes migraine, acute intracranial process  MDM: 66 year old female with past medical history of paroxysmal A. fib, bipolar, migraine, CKD presents today for evaluation of left-sided migraine headache of 2-day duration.  Patient is particular home medication without significant improvement.  She denies fever, chills.  Neuro exam is nonfocal and without deficit.  While undergoing work-up and before medication administration patient became hypotensive requiring fluid bolus.  Basic blood work revealed CBC without leukocytosis, hemoglobin with mild anemia 10.7 which is decreased from 11.7 from most recent CBC.  CMP with glucose of 217, creatinine of 1.91 which is slightly increased from her baseline of between 1.6-1.8 otherwise without acute findings.  Some improvement in blood pressure following 1 L bolus.  Patient reports improvement in headache following Toradol. Patient orthostatic.  Will provide additional fluids.  Will check lactic acid.  We have considered septic work-up however patient does not have leukocytosis, is without fever, does not have complaints of dysuria, shortness of breath, abdominal pain or other signs or symptoms concerning for infection.  Will provide IV hydration and reassess.   Lab Tests: -I ordered, reviewed, and interpreted labs.   The pertinent  results include:   Labs Reviewed  CBC WITH DIFFERENTIAL/PLATELET - Abnormal; Notable for the following components:      Result Value   RBC 3.34 (*)  Hemoglobin 10.7 (*)    HCT 32.2 (*)    All other components within normal limits  COMPREHENSIVE METABOLIC PANEL      EKG  EKG Interpretation  Date/Time:    Ventricular Rate:    PR Interval:    QRS Duration:   QT Interval:    QTC Calculation:   R Axis:     Text Interpretation:           Medicines ordered and prescription drug management: Meds ordered this encounter  Medications   sodium chloride 0.9 % bolus 1,000 mL   ondansetron (ZOFRAN) injection 4 mg   diphenhydrAMINE (BENADRYL) injection 12.5 mg   DISCONTD: HYDROmorphone (DILAUDID) injection 1 mg   ketorolac (TORADOL) 15 MG/ML injection 15 mg   sodium chloride 0.9 % bolus 1,000 mL    -I have reviewed the patients home medicines and have made adjustments as needed  Critical interventions Fluid resuscitation for orthostasis  Cardiac Monitoring: The patient was maintained on a cardiac monitor.  I personally viewed and interpreted the cardiac monitored which showed an underlying rhythm of: Normal sinus rhythm  Reevaluation: After the interventions noted above, I reevaluated the patient and found that they have :stayed the same  Co morbidities that complicate the patient evaluation  Past Medical History:  Diagnosis Date   A-fib ([place])    Abscess 02/03/2019   Acute cystitis without hematuria 05/14/2017   Last Assessment & Plan:  Formatting of this note might be different from the original. - suprapubic tenderness with symptoms of urinary frequency and urgency and subjective fevers (no fevers recorded on any visit). Recently passed a renal stone, US shows left hydronephrosis which is improving.  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 A:  Chronic.  Sinus rhythm at this time.  States she was told this during admission at [facility] in the past. P:  On baby aspirin at home will continue.  Low dose metoprolol started.   Bipolar 1 disorder ([place]) 10/11/2013   Bipolar 1 disorder, depressed, moderate ([place]) 02/18/2020   Bipolar disorder ([place])    Cellulitis 02/03/2019   Chest pain 02/19/2020   Chronic anticoagulation 02/20/2020   Chronic atrial fibrillation ([place]) 02/18/2020   Chronic headache 10/11/2013   Chronic migraine without aura, intractable, without status migrainosus 06/24/2017   Chronic pain of both shoulders 11/14/2015   Colon cancer screening 04/15/2016   Last Assessment & Plan:  Formatting of this note might be different from the original. Will schedule for colonoscopy   Depressed bipolar I disorder ([place]) 02/06/2021   Depression    Depression, recurrent ([place]) 02/18/2020   Diabetes mellitus ([place])    Diabetes mellitus due to underlying condition with unspecified complications ([place]) 01/10/4331   Diabetes mellitus type 2 in obese ([place]) 10/11/2013   Last Assessment & Plan:  Formatting of this note might be different from the original. A:  Diabetes Mellitus Type 2 without long term use of insulin P:  Hold Home metformin. Reg ISS.   Diabetes mellitus without complication ([place])    Dyslipidemia 02/20/2020   Epilepsy ([place]) 02/06/2021   GAD (generalized anxiety  disorder) 02/18/2020   Heartburn 04/15/2016   Last Assessment & Plan:  Formatting of this note might be different from the original. Patient was counseled regarding lifestyle modification  Advised to take Prilosec 30 mins before breakfast  Will schedule for EGD   History of atrial fibrillation 06/24/2017   History of posttraumatic stress disorder (PTSD) 10/07/2016   HLD (hyperlipidemia)  09/01/2014   Last Assessment & Plan:  Formatting of this note might be different from the original. A: Chronic. P: Continue statin.   Hyperglycemia due to type 2 diabetes mellitus ([place]) 02/06/2021   Hyperosmolarity due to secondary diabetes mellitus ([place]) 02/06/2021   Hypertriglyceridemia 02/18/2020   Impaired mobility and activities of daily living 11/14/2015   Insomnia 02/06/2021   Irritable bowel syndrome (IBS)    Kidney stone 02/06/2021   Left elbow pain 11/14/2015   Migraine without aura, not refractory 02/06/2021   Migraines 10/11/2013   Last Assessment & Plan:  Formatting of this note might be different from the original. A: Chronic. P: Continue Topamax. (also takes it for bipolar)   Morbid obesity ([place]) 12/16/2016   Multiple sclerosis ([place]) 06/24/2017   Obesity (BMI 30-39.9) 09/01/2014   Last Assessment & Plan:  Formatting of this note might be different from the original. A: Morbid obesity, with BMI of 35.0 - 39.9 P: Counseled to loose wt.   Paroxysmal atrial fibrillation ([facility]) 04/14/2020   Polyneuropathy due to type 2 diabetes mellitus ([place]) 02/06/2021   Refractory migraine with aura 02/06/2021   Refractory migraine without aura 05/11/6386   Renal colic on left side 5/64/3329   Seizure disorder ([place]) 06/24/2017   Sleep apnea 02/06/2021   Sleep-disordered breathing 06/05/2018   Stage 3 chronic kidney disease ([place]) 02/20/2020   Unstable angina ([place]) 09/01/2014   Last Assessment & Plan:  Formatting of this note might be different from the original. A: Pt has 2 months exertional angina, recently developed angina at rest intermittently.  She had a positive nuclear medicine cardiolyte stress test with anteroapical ischemia on stress portion not seen at rest. P:  - continue aspirin, statin, metoprolol - Discussed stress test findings with cardiologist Dr. Landis Gandy   Urinary tract obstruction 02/06/2021      Dispostion: Patient at the end of my shift is receiving IV hydration for orthostasis.  Patient signed out to  Dr. Stark Jock for reassessment and appropriate disposition.   Final Clinical Impression(s) / ED Diagnoses Final diagnoses:  None    Rx / DC Orders ED Discharge Orders     None         Evlyn Courier, PA-C 10/02/21 2339    Veryl Speak, MD 10/03/21 [phone]

## 2021-10-02 NOTE — ED Notes (Signed)
MD made aware of lactic 2.3

## 2021-10-02 NOTE — ED Triage Notes (Signed)
Pt migraine day 2-no relief with meds-NAD-steady gait

## 2021-10-02 NOTE — ED Notes (Signed)
Pt BP decreased and MD made aware. Meds held and provider at bedside to re-assess pt

## 2021-10-03 MED ORDER — LACTATED RINGERS IV BOLUS: 500.0000 mL | Freq: Once | INTRAVENOUS | Status: DC

## 2021-10-03 NOTE — Discharge Instructions (Signed)
Reduce your metoprolol XL to 50 mg once daily.  Record your blood pressures over the next several days.  If you feel dizzy, weak, lightheaded, or develop other issues, you should return to the ER to be reevaluated.

## 2021-10-05 ENCOUNTER — Ambulatory Visit: Payer: Managed Care, Other (non HMO) | Admitting: Family Medicine

## 2021-10-09 ENCOUNTER — Ambulatory Visit (INDEPENDENT_AMBULATORY_CARE_PROVIDER_SITE_OTHER): Payer: Managed Care, Other (non HMO) | Admitting: Family Medicine

## 2021-10-09 ENCOUNTER — Encounter: Payer: Self-pay | Admitting: Family Medicine

## 2021-10-09 VITALS — BP 112/64 | HR 65 | Temp 97.4°F | Resp 16 | Ht 66.0 in | Wt 218.2 lb

## 2021-10-09 DIAGNOSIS — M25512 Pain in left shoulder: Secondary | ICD-10-CM

## 2021-10-09 MED ORDER — TRAMADOL HCL 50 MG PO TABS: 50.0000 mg | ORAL_TABLET | Freq: Three times a day (TID) | ORAL | 0 refills | Status: AC | PRN

## 2021-10-09 NOTE — Progress Notes (Signed)
Musculoskeletal Exam  Patient: Paula Stout DOB: 09/01/1956  DOS: 10/09/2021  SUBJECTIVE:  Chief Complaint:   Chief Complaint  Patient presents with   Shoulder Pain    Here for shoulder  left pain onset few weeks    Paula Stout is a 66 y.o.  female for evaluation and treatment of L shoulder pain.   Onset:  4 weeks ago. Reached in back seat  Location: L trap region, L lower rib cage Character:  sharp  Progression of issue:  is worsened Associated symptoms: decreased ROM No bruising, swelling Treatment: to date has been ice, OTC NSAIDS, prednisone, muscle relaxers, and home exercises.   Neurovascular symptoms: no  Past Medical History:  Diagnosis Date   A-fib ([place])    Abscess 02/03/2019   Acute cystitis without hematuria 05/14/2017   Last Assessment & Plan:  Formatting of this note might be different from the original. - suprapubic tenderness with symptoms of urinary frequency and urgency and subjective fevers (no fevers recorded on any visit). Recently passed a renal stone, US shows left hydronephrosis which is improving.  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 A:  Chronic.  Sinus rhythm at this time.  States she was told this during admission at [facility] in the past. P:  On baby aspirin at home will continue.  Low dose metoprolol started.   Bipolar 1 disorder ([place]) 10/11/2013   Bipolar 1 disorder, depressed, moderate ([place]) 02/18/2020   Bipolar disorder ([place])    Cellulitis 02/03/2019   Chest pain 02/19/2020   Chronic anticoagulation 02/20/2020   Chronic atrial fibrillation ([place]) 02/18/2020   Chronic headache 10/11/2013   Chronic migraine without  aura, intractable, without status migrainosus 06/24/2017   Chronic pain of both shoulders 11/14/2015   Colon cancer screening 04/15/2016   Last Assessment & Plan:  Formatting of this note might be different from the original. Will schedule for colonoscopy   Depressed bipolar I disorder ([place]) 02/06/2021   Depression    Depression, recurrent ([place]) 02/18/2020   Diabetes mellitus ([place])    Diabetes mellitus due to underlying condition with unspecified complications ([place]) 01/11/176   Diabetes mellitus type 2 in obese ([place]) 10/11/2013   Last Assessment & Plan:  Formatting of this note might be different from the original. A:  Diabetes Mellitus Type 2 without long term use of insulin P:  Hold Home metformin. Reg ISS.   Diabetes mellitus without complication ([place])    Dyslipidemia 02/20/2020   Epilepsy ([place]) 02/06/2021   GAD (generalized anxiety disorder) 02/18/2020   Heartburn 04/15/2016   Last Assessment & Plan:  Formatting of this note might be different from the original. Patient was counseled regarding lifestyle modification  Advised to take Prilosec 30 mins before breakfast  Will schedule for EGD   History of atrial fibrillation 06/24/2017   History of posttraumatic stress disorder (PTSD) 10/07/2016   HLD (hyperlipidemia) 09/01/2014   Last Assessment & Plan:  Formatting of this note might be different from the original. A: Chronic. P: Continue statin.   Hyperglycemia due to type 2 diabetes mellitus ([place]) 02/06/2021   Hyperosmolarity due to  secondary diabetes mellitus ([place]) 02/06/2021   Hypertriglyceridemia 02/18/2020   Impaired mobility and activities of daily living 11/14/2015   Insomnia 02/06/2021   Irritable bowel syndrome (IBS)    Kidney stone 02/06/2021   Left elbow pain 11/14/2015   Migraine without aura, not refractory 02/06/2021   Migraines 10/11/2013   Last Assessment & Plan:  Formatting of this note might be different from the original. A: Chronic. P: Continue Topamax. (also takes it for bipolar)   Morbid  obesity ([place]) 12/16/2016   Multiple sclerosis ([place]) 06/24/2017   Obesity (BMI 30-39.9) 09/01/2014   Last Assessment & Plan:  Formatting of this note might be different from the original. A: Morbid obesity, with BMI of 35.0 - 39.9 P: Counseled to loose wt.   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 A: Pt has 2 months exertional angina, recently developed angina at rest intermittently.  She had a positive nuclear medicine cardiolyte stress test with anteroapical ischemia on stress portion not seen at rest. P:  - continue aspirin, statin, metoprolol - Discussed stress test findings with cardiologist Dr. Landis Gandy   Urinary tract obstruction 02/06/2021    Objective: VITAL SIGNS: BP 112/64 (BP Location: Left Arm, Patient Position: Sitting, Cuff Size: Large)    Pulse 65    Temp (!) 97.4 F (36.3 C) (Oral)    Resp 16    Ht 5\' 6"  (1.676 m)    Wt 218 lb 3.2 oz (99 kg)    SpO2 97%    BMI 35.22 kg/m  Constitutional: Well formed, well developed. No acute distress. Thorax & Lungs: No accessory muscle use Musculoskeletal: L shoulder.   Normal active range of motion: no.   Normal passive range of motion: no Tenderness to palpation: yes over biceps tendon and L trap Deformity: no Ecchymosis: no Tests positive: speed's Tests negative: lift off, empty can, neer's, cross over, hawkins, obrien's Neurologic: Normal sensory function. No focal deficits noted. DTR's equal and symmetric in UE's. No clonus. Psychiatric: Normal mood. Age appropriate judgment and  insight. Alert & oriented x 3.    Assessment:  Acute pain of left shoulder - Plan: Ambulatory referral to Sports Medicine, traMADol (ULTRAM) 50 MG tablet  Plan: Stretches/exercises, heat, ice, Tylenol. Tramadol prn. Refer sports med.  F/u as originally scheduled. The patient voiced understanding and agreement to the plan.   [place], DO 10/09/21  1:27 PM

## 2021-10-09 NOTE — Patient Instructions (Addendum)
If you do not hear anything about your referral in the next week, call our office and ask for an update.  Heat (pad or rice pillow in microwave) over affected area, 10-15 minutes twice daily.   Ice/cold pack over area for 10-15 min twice daily.  OK to take Tylenol 1000 mg (2 extra strength tabs) or 975 mg (3 regular strength tabs) every 6 hours as needed.  Do not drink alcohol, do any illicit/street drugs, drive or do anything that requires alertness while on this medicine.   Stay on the stretches and exercises.   Let us know if you need anything.

## 2021-10-10 ENCOUNTER — Other Ambulatory Visit: Payer: Self-pay

## 2021-10-10 ENCOUNTER — Emergency Department (HOSPITAL_BASED_OUTPATIENT_CLINIC_OR_DEPARTMENT_OTHER)
Admission: EM | Admit: 2021-10-10 | Discharge: 2021-10-10 | Disposition: A | Discharge status: Home / Self Care | Payer: Commercial Managed Care - HMO | Attending: Emergency Medicine | Admitting: Emergency Medicine

## 2021-10-10 ENCOUNTER — Encounter (HOSPITAL_BASED_OUTPATIENT_CLINIC_OR_DEPARTMENT_OTHER): Payer: Self-pay | Admitting: *Deleted

## 2021-10-10 DIAGNOSIS — E1142 Type 2 diabetes mellitus with diabetic polyneuropathy: Secondary | ICD-10-CM | POA: Insufficient documentation

## 2021-10-10 DIAGNOSIS — G43909 Migraine, unspecified, not intractable, without status migrainosus: Secondary | ICD-10-CM | POA: Diagnosis not present

## 2021-10-10 DIAGNOSIS — E1122 Type 2 diabetes mellitus with diabetic chronic kidney disease: Secondary | ICD-10-CM | POA: Insufficient documentation

## 2021-10-10 DIAGNOSIS — R112 Nausea with vomiting, unspecified: Secondary | ICD-10-CM | POA: Diagnosis present

## 2021-10-10 DIAGNOSIS — Z7901 Long term (current) use of anticoagulants: Secondary | ICD-10-CM | POA: Insufficient documentation

## 2021-10-10 DIAGNOSIS — N183 Chronic kidney disease, stage 3 unspecified: Secondary | ICD-10-CM | POA: Diagnosis not present

## 2021-10-10 DIAGNOSIS — I480 Paroxysmal atrial fibrillation: Secondary | ICD-10-CM | POA: Diagnosis not present

## 2021-10-10 DIAGNOSIS — G43009 Migraine without aura, not intractable, without status migrainosus: Secondary | ICD-10-CM

## 2021-10-10 DIAGNOSIS — J45909 Unspecified asthma, uncomplicated: Secondary | ICD-10-CM | POA: Diagnosis not present

## 2021-10-10 MED ORDER — SODIUM CHLORIDE 0.9 % IV SOLN
12.5000 mg | Freq: Once | INTRAVENOUS | Status: AC
  Administered 2021-10-10: 12.5 mg via INTRAVENOUS
  Filled 2021-10-10: qty 0.5

## 2021-10-10 MED ORDER — PROMETHAZINE HCL 25 MG/ML IJ SOLN
INTRAMUSCULAR | Status: AC
  Filled 2021-10-10: qty 1

## 2021-10-10 MED ORDER — HYDROMORPHONE HCL 1 MG/ML IJ SOLN
1.0000 mg | Freq: Once | INTRAMUSCULAR | Status: AC
  Administered 2021-10-10: 1 mg via INTRAVENOUS
  Filled 2021-10-10: qty 1

## 2021-10-10 MED ORDER — SODIUM CHLORIDE 0.9 % IV BOLUS
500.0000 mL | Freq: Once | INTRAVENOUS | Status: AC
  Administered 2021-10-10: 500 mL via INTRAVENOUS

## 2021-10-10 MED ORDER — DIPHENHYDRAMINE HCL 50 MG/ML IJ SOLN
25.0000 mg | Freq: Once | INTRAMUSCULAR | Status: AC
  Administered 2021-10-10: 25 mg via INTRAVENOUS
  Filled 2021-10-10: qty 1

## 2021-10-10 MED ORDER — SODIUM CHLORIDE 0.9 % IV SOLN: INTRAVENOUS | Status: DC | PRN

## 2021-10-10 NOTE — ED Provider Notes (Signed)
Emergency Department Provider Note   I have reviewed the triage vital signs and the nursing notes.   HISTORY  Chief Complaint Migraine   HPI Paula Stout is a 66 y.o. female with past history reviewed below including migraine headaches presents emergency department with migraine headache over the past 2 days.  She reports that symptoms have been refractory to her home medications.  She is maxed out dosing of her home meds and continues to have headache.  She describes a throbbing headache with photophobia and nausea/vomiting.  She states the headache feels similar to her prior migraines and is not new or different in any way.  Denies any sudden onset, maximal intensity headache symptoms.  No fevers or chills.   Past Medical History:  Diagnosis Date   A-fib [facility])    Abscess 02/03/2019   Acute cystitis without hematuria 05/14/2017   Last Assessment & Plan:  Formatting of this note might be different from the original. - suprapubic tenderness with symptoms of urinary frequency and urgency and subjective fevers (no fevers recorded on any visit). Recently passed a renal stone, US shows left hydronephrosis which is improving.  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 A:  Chronic.  Sinus rhythm at this time.  States she was told this during admission at [facility] in the past. P:  On baby aspirin at home will continue.  Low dose metoprolol started.   Bipolar 1 disorder ([place]) 10/11/2013   Bipolar 1 disorder, depressed, moderate ([place]) 02/18/2020   Bipolar disorder ([place])    Cellulitis 02/03/2019   Chest pain 02/19/2020   Chronic anticoagulation 02/20/2020   Chronic  atrial fibrillation ([place]) 02/18/2020   Chronic headache 10/11/2013   Chronic migraine without aura, intractable, without status migrainosus 06/24/2017   Chronic pain of both shoulders 11/14/2015   Colon cancer screening 04/15/2016   Last Assessment & Plan:  Formatting of this note might be different from the original. Will schedule for colonoscopy   Depressed bipolar I disorder ([place]) 02/06/2021   Depression    Depression, recurrent ([place]) 02/18/2020   Diabetes mellitus ([place])    Diabetes mellitus due to underlying condition with unspecified complications ([place]) 09/11/1094   Diabetes mellitus type 2 in obese ([place]) 10/11/2013   Last Assessment & Plan:  Formatting of this note might be different from the original. A:  Diabetes Mellitus Type 2 without Alexzander Dolinger term use of insulin P:  Hold Home metformin. Reg ISS.   Diabetes mellitus without complication ([place])    Dyslipidemia 02/20/2020   Epilepsy ([place]) 02/06/2021   GAD (generalized anxiety disorder) 02/18/2020   Heartburn 04/15/2016   Last Assessment & Plan:  Formatting of this note might be different from the original. Patient was counseled regarding lifestyle modification  Advised to take Prilosec 30 mins before breakfast  Will schedule for EGD   History of atrial fibrillation 06/24/2017   History of posttraumatic stress disorder (PTSD) 10/07/2016   HLD (hyperlipidemia) 09/01/2014   Last Assessment & Plan:  Formatting of this note might be different from the original. A: Chronic. P: Continue statin.   Hyperglycemia due to  type 2 diabetes mellitus ([place]) 02/06/2021   Hyperosmolarity due to secondary diabetes mellitus ([place]) 02/06/2021   Hypertriglyceridemia 02/18/2020   Impaired mobility and activities of daily living 11/14/2015   Insomnia 02/06/2021   Irritable bowel syndrome (IBS)    Kidney stone 02/06/2021   Left elbow pain 11/14/2015   Migraine without aura, not refractory 02/06/2021   Migraines 10/11/2013   Last Assessment & Plan:  Formatting of this note might be different  from the original. A: Chronic. P: Continue Topamax. (also takes it for bipolar)   Morbid obesity ([place]) 12/16/2016   Multiple sclerosis ([place]) 06/24/2017   Obesity (BMI 30-39.9) 09/01/2014   Last Assessment & Plan:  Formatting of this note might be different from the original. A: Morbid obesity, with BMI of 35.0 - 39.9 P: Counseled to loose wt.   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 A: Pt has 2 months exertional angina, recently developed angina at rest intermittently.  She had a positive nuclear medicine cardiolyte stress test with anteroapical ischemia on stress portion not seen at rest. P:  - continue aspirin, statin, metoprolol - Discussed stress test findings with cardiologist Dr. Landis Gandy   Urinary tract obstruction 02/06/2021    Review of Systems  Constitutional: No fever/chills Eyes: No visual changes. ENT: No sore throat. Cardiovascular: Denies chest pain. Respiratory: Denies shortness of breath. Gastrointestinal: No abdominal pain. Positive nausea and vomiting.  No diarrhea.  No constipation. Genitourinary: Negative for dysuria. Musculoskeletal: Negative for back pain. Skin: Negative for rash. Neurological: Negative for focal weakness or numbness. Positive HA.   ____________________________________________   PHYSICAL EXAM:  VITAL SIGNS: ED Triage Vitals  Enc Vitals Group     BP 10/10/21 1811 (!) 148/82     Pulse Rate 10/10/21 1811 76     Resp 10/10/21 1811 16     Temp 10/10/21 1811 98 F (36.7 C)     Temp Source  10/10/21 1811 Oral     SpO2 10/10/21 1811 99 %     Weight 10/10/21 1812 212 lb (96.2 kg)   Constitutional: Alert and oriented. Well appearing and in no acute distress. Sitting in dark room. No distress.  Eyes: Conjunctivae are normal. PERRL. Positive photophobia.  Head: Atraumatic. Nose: No congestion/rhinnorhea. Mouth/Throat: Mucous membranes are moist.  Neck: No stridor.   Cardiovascular: Good peripheral circulation.  Respiratory: Normal respiratory effort.   Gastrointestinal: No distention.  Musculoskeletal: No lower extremity tenderness nor edema. No gross deformities of extremities. Neurologic:  Normal speech and language. No gross focal neurologic deficits are appreciated.  Skin:  Skin is warm, dry and intact. No rash noted.   ____________________________________________   PROCEDURES  Procedure(s) performed:   Procedures  None ____________________________________________   INITIAL IMPRESSION / ASSESSMENT AND PLAN / ED COURSE  Pertinent labs & imaging results that were available during my care of the patient were reviewed by me and considered in my medical decision making (see chart for details).   This patient is Presenting for Evaluation of headache, which does require a range of treatment  options, and is a complaint that involves a high risk of morbidity and mortality.  The Differential Diagnoses includes but is not exclusive to subarachnoid hemorrhage, meningitis, encephalitis, previous head trauma, cavernous venous thrombosis, muscle tension headache, glaucoma, temporal arteritis, migraine or migraine equivalent, etc.   Critical Interventions-    Medications  sodium chloride 0.9 % bolus 500 mL (0 mLs Intravenous Stopped 10/10/21 2003)  HYDROmorphone (DILAUDID) injection 1 mg (1 mg Intravenous Given 10/10/21 1844)  promethazine (PHENERGAN) 12.5 mg in sodium chloride 0.9 % 50 mL IVPB (0 mg Intravenous Stopped 10/10/21 2002)  diphenhydrAMINE (BENADRYL) injection 25 mg  (25 mg Intravenous Given 10/10/21 1842)    Reassessment after intervention:  HA improving.    I decided to review pertinent External Data, and in summary no recent Neuro notes for review.   Radiologic Tests: Considered CT imaging of the head but Ha features are similar to prior HAs. No neuro deficits. Hold on imaging for now.   Social Determinants of Health Risk non-smoking  Medical Decision Making: Summary:  Patient presents emergency department with migraine type headache.  She states this is typical of her migraines and does not feel new or different.  No sudden onset/maximal intensity headache symptoms.  Treated with cocktail of medication which is provided relief in the past.  Upon reassessment, patient is more comfortable.  She has a ride home and will go home to rest.  Discussed PCP/neuro follow-up as an outpatient.   Disposition: discharge  ____________________________________________  FINAL CLINICAL IMPRESSION(S) / ED DIAGNOSES  Final diagnoses:  Migraine without aura and without status migrainosus, not intractable    Note:  This document was prepared using Dragon voice recognition software and may include unintentional dictation errors.  Nanda Quinton, MD, [facility] Emergency Medicine    Tajah Noguchi, Wonda Old[facility], MD 10/20/21 [phone]

## 2021-10-10 NOTE — ED Triage Notes (Signed)
Pt is here for migraine which has been getting worse since yesterday despite her migraine medication which she has taken.  Pt is having sensitivity to light and sharp pain on left side of her head.  Pt is alert and oriented, neck supple, no fever.  Pt has had nausea and vomiting with this.

## 2021-10-10 NOTE — Discharge Instructions (Signed)

## 2021-10-13 ENCOUNTER — Telehealth: Payer: Self-pay | Admitting: Family Medicine

## 2021-10-13 NOTE — Telephone Encounter (Signed)
10/09/21-Called pt to scheduled referral appt, no answer, msg left-- Pt cld back left msg to call her for scheduling -  2/7-cb no answer @ [phone]--phone just ring no VMB pick up to lv msg -- waiting on call frm pt to set appt date/time.  --glh

## 2021-10-14 ENCOUNTER — Other Ambulatory Visit: Payer: Self-pay | Admitting: Family Medicine

## 2021-10-14 MED ORDER — LEVOCETIRIZINE DIHYDROCHLORIDE 5 MG PO TABS: 5.0000 mg | ORAL_TABLET | Freq: Every evening | ORAL | 0 refills | Status: DC

## 2021-10-14 MED ORDER — ONETOUCH VERIO VI STRP: ORAL_STRIP | 3 refills | Status: DC

## 2021-10-15 ENCOUNTER — Other Ambulatory Visit: Payer: Self-pay

## 2021-10-15 ENCOUNTER — Emergency Department (HOSPITAL_BASED_OUTPATIENT_CLINIC_OR_DEPARTMENT_OTHER): Payer: Commercial Managed Care - HMO

## 2021-10-15 ENCOUNTER — Emergency Department (HOSPITAL_BASED_OUTPATIENT_CLINIC_OR_DEPARTMENT_OTHER)
Admission: EM | Admit: 2021-10-15 | Discharge: 2021-10-15 | Disposition: A | Discharge status: Home / Self Care | Payer: Commercial Managed Care - HMO | Attending: Emergency Medicine | Admitting: Emergency Medicine

## 2021-10-15 ENCOUNTER — Encounter (HOSPITAL_BASED_OUTPATIENT_CLINIC_OR_DEPARTMENT_OTHER): Payer: Self-pay

## 2021-10-15 ENCOUNTER — Encounter: Payer: Self-pay | Admitting: Family Medicine

## 2021-10-15 DIAGNOSIS — Z794 Long term (current) use of insulin: Secondary | ICD-10-CM | POA: Diagnosis not present

## 2021-10-15 DIAGNOSIS — Z7901 Long term (current) use of anticoagulants: Secondary | ICD-10-CM | POA: Diagnosis not present

## 2021-10-15 DIAGNOSIS — Z7984 Long term (current) use of oral hypoglycemic drugs: Secondary | ICD-10-CM | POA: Insufficient documentation

## 2021-10-15 DIAGNOSIS — G43009 Migraine without aura, not intractable, without status migrainosus: Secondary | ICD-10-CM | POA: Insufficient documentation

## 2021-10-15 DIAGNOSIS — I4891 Unspecified atrial fibrillation: Secondary | ICD-10-CM | POA: Diagnosis not present

## 2021-10-15 DIAGNOSIS — E119 Type 2 diabetes mellitus without complications: Secondary | ICD-10-CM | POA: Insufficient documentation

## 2021-10-15 DIAGNOSIS — R519 Headache, unspecified: Secondary | ICD-10-CM | POA: Diagnosis present

## 2021-10-15 LAB — BASIC METABOLIC PANEL
Anion gap: 9 (ref 5–15)
BUN: 21 mg/dL (ref 8–23)
CO2: 18 mmol/L — ABNORMAL LOW (ref 22–32)
Calcium: 9.3 mg/dL (ref 8.9–10.3)
Chloride: 113 mmol/L — ABNORMAL HIGH (ref 98–111)
Creatinine, Ser: 1.66 mg/dL — ABNORMAL HIGH (ref 0.44–1.00)
GFR, Estimated: 34 mL/min — ABNORMAL LOW (ref >60)
Glucose, Bld: 94 mg/dL (ref 70–99)
Potassium: 4.2 mmol/L (ref 3.5–5.1)
Sodium: 140 mmol/L (ref 135–145)

## 2021-10-15 LAB — CBC WITH DIFFERENTIAL/PLATELET
Abs Immature Granulocytes: 0.02 10*3/uL (ref 0.00–0.07)
Basophils Absolute: 0.1 10*3/uL (ref 0.0–0.1)
Basophils Relative: 1 %
Eosinophils Absolute: 0.3 10*3/uL (ref 0.0–0.5)
Eosinophils Relative: 4 %
HCT: 35.7 % — ABNORMAL LOW (ref 36.0–46.0)
Hemoglobin: 11.5 g/dL — ABNORMAL LOW (ref 12.0–15.0)
Immature Granulocytes: 0 %
Lymphocytes Relative: 32 %
Lymphs Abs: 2.9 10*3/uL (ref 0.7–4.0)
MCH: 31.5 pg (ref 26.0–34.0)
MCHC: 32.2 g/dL (ref 30.0–36.0)
MCV: 97.8 fL (ref 80.0–100.0)
Monocytes Absolute: 0.7 10*3/uL (ref 0.1–1.0)
Monocytes Relative: 7 %
Neutro Abs: 5 10*3/uL (ref 1.7–7.7)
Neutrophils Relative %: 56 %
Platelets: 363 10*3/uL (ref 150–400)
RBC: 3.65 MIL/uL — ABNORMAL LOW (ref 3.87–5.11)
RDW: 14.8 % (ref 11.5–15.5)
WBC: 9 10*3/uL (ref 4.0–10.5)
nRBC: 0 % (ref 0.0–0.2)

## 2021-10-15 MED ORDER — DIPHENHYDRAMINE HCL 50 MG/ML IJ SOLN
25.0000 mg | Freq: Once | INTRAMUSCULAR | Status: AC
Start: 2021-10-15 — End: 2021-10-15
  Administered 2021-10-15: 25 mg via INTRAVENOUS
  Filled 2021-10-15: qty 1

## 2021-10-15 MED ORDER — DEXAMETHASONE SODIUM PHOSPHATE 10 MG/ML IJ SOLN
10.0000 mg | Freq: Once | INTRAMUSCULAR | Status: AC
  Administered 2021-10-15: 10 mg via INTRAVENOUS
  Filled 2021-10-15: qty 1

## 2021-10-15 MED ORDER — SODIUM CHLORIDE 0.9 % IV SOLN
12.5000 mg | Freq: Four times a day (QID) | INTRAVENOUS | Status: DC | PRN
  Administered 2021-10-15: 12.5 mg via INTRAVENOUS
  Filled 2021-10-15: qty 0.5

## 2021-10-15 MED ORDER — PROMETHAZINE HCL 25 MG/ML IJ SOLN
INTRAMUSCULAR | Status: AC
  Administered 2021-10-15: 12.5 mg
  Filled 2021-10-15: qty 1

## 2021-10-15 MED ORDER — HYDROMORPHONE HCL 1 MG/ML IJ SOLN
1.0000 mg | Freq: Once | INTRAMUSCULAR | Status: AC
  Administered 2021-10-15: 1 mg via INTRAVENOUS
  Filled 2021-10-15: qty 1

## 2021-10-15 NOTE — ED Triage Notes (Signed)
Pt c/o migraine day 2-NAD-steady gait

## 2021-10-15 NOTE — ED Provider Notes (Signed)
[facility]PARTMENT Provider Note   CSN: [id number] Arrival date & time: 10/15/21  1439    History  Chief Complaint  Patient presents with   Migraine    Jeremiah Tarpley is a 66 y.o. female history of migraines, follows with neurology, DM here for evaluation of migraine.  History of frequent migraines.  She is on prophylactic medication as well as Roselyn Meier.  She was seen here 2 weeks ago for migraine as well as 1 week ago.  Patient states that she received a migraine cocktail symptoms resolved for about 4 to 5 days however it returned.  She contacted her neurologist who was not able to get her in to see neurology.  She is requesting change in neurology.  She comes with a list of her "migraine medications."  They include Dilaudid, Decadron, Phenergan, Benadryl.  States this headache does feel like her typical migraine, denies any recent head trauma however states her headaches have been worse than normal.  She is anticoagulated for A-fib.  No vision changes, paresthesias, weakness, sudden onset thunderclap headache, neck stiffness, neck rigidity, facial droop, difficulty with word finding.  HPI     Home Medications Prior to Admission medications   Medication Sig Start Date End Date Taking? Authorizing Provider  albuterol (VENTOLIN HFA) 108 (90 Base) MCG/ACT inhaler Inhale 2 puffs into the lungs every 4 (four) hours as needed for wheezing. 06/23/20   Wendling, Crosby Oyster, DO  apixaban (ELIQUIS) 5 MG TABS tablet Take 1 tablet (5 mg total) by mouth 2 (two) times daily. 09/16/21   Revankar, Reita Cliche, MD  atorvastatin (LIPITOR) 80 MG tablet Take 1 tablet (80 mg total) by mouth daily. 04/23/20   [name], DO  blood glucose meter kit and supplies One Touch Ultra  Check blood sugars once daily Dx: E11.9 10/01/21   [name], DO  buPROPion (WELLBUTRIN XL) 300 MG 24 hr tablet Take 1 tablet (300 mg total) by mouth daily. 10/08/20   Pucilowski, Olgierd A,  MD  EPINEPHrine 0.3 mg/0.3 mL IJ SOAJ injection Inject 0.3 mg into the muscle as needed for anaphylaxis. 11/17/20   [name], DO  fenofibrate 160 MG tablet Take 1 tablet (160 mg total) by mouth daily. 09/22/21   Revankar, Reita Cliche, MD  flecainide (TAMBOCOR) 50 MG tablet Take 1 tablet (50 mg total) by mouth 2 (two) times daily. 09/22/21   Revankar, Reita Cliche, MD  Galcanezumab-gnlm (EMGALITY) 120 MG/ML SOAJ Inject 120 mg into the skin every 30 (thirty) days.    [provider]  glucose blood (ONETOUCH VERIO) test strip Use daily to check blood sugar. 10/14/21   [name], DO  isosorbide mononitrate (IMDUR) 30 MG 24 hr tablet Take 0.5 tablets (15 mg total) by mouth daily. 09/22/21   Camnitz, Ocie Doyne, MD  levocetirizine (XYZAL) 5 MG tablet Take 1 tablet (5 mg total) by mouth every evening. 10/14/21   [name], DO  Lurasidone HCl 120 MG TABS Take 1 tablet (120 mg total) by mouth at bedtime. 09/16/21   [name], DO  metFORMIN (GLUCOPHAGE) 1000 MG tablet Take 1 tablet (1,000 mg total) by mouth 2 (two) times daily with a meal. 09/22/21   Wendling, Crosby Oyster, DO  metoprolol succinate (TOPROL-XL) 50 MG 24 hr tablet Take 1 tablet (50 mg total) by mouth daily. Take with or immediately following a meal. 04/04/20   Wendling, Crosby Oyster, DO  nitroGLYCERIN (NITROSTAT) 0.4 MG SL tablet Place 1  tablet (0.4 mg total) under the tongue every 5 (five) minutes as needed for chest pain. 09/16/21   Revankar, Reita Cliche, MD  ondansetron (ZOFRAN-ODT) 4 MG disintegrating tablet Take 1 tablet (4 mg total) by mouth every 8 (eight) hours as needed for nausea. 02/18/20   [name], DO  pantoprazole (PROTONIX) 40 MG tablet Take 40 mg by mouth daily.    [provider]  QUEtiapine (SEROQUEL) 300 MG tablet Take 1 tablet (300 mg total) by mouth at bedtime. 10/08/20   Pucilowski, Marchia Bond, MD  tiZANidine (ZANAFLEX) 4 MG tablet Take 1 tablet (4 mg total) by  mouth every 6 (six) hours as needed for muscle spasms. 10/02/21   Colon Branch, MD  topiramate (TOPAMAX) 50 MG tablet Take 3 tablets (150 mg total) by mouth 2 (two) times daily. 09/22/21   Wendling, Crosby Oyster, DO  TRULICITY 1.5 RD/4.0CX SOPN Inject 1.5 mg into the skin as directed once a week. 09/22/21   [name], DO  Ubrogepant 100 MG TABS Take 100 mg by mouth as needed for migraine.    [provider]      Allergies    Bee venom, Onion, Tetanus-diphtheria toxoids td, Sumatriptan, Asa [aspirin], Nsaids, Iodine, Sulfa antibiotics, and Vancomycin    Review of Systems   Review of Systems  Constitutional: Negative.   HENT: Negative.    Eyes:  Positive for photophobia.  Respiratory: Negative.    Cardiovascular: Negative.   Gastrointestinal: Negative.   Genitourinary: Negative.   Musculoskeletal: Negative.   Skin: Negative.   Neurological:  Positive for headaches. Negative for dizziness, tremors, seizures, syncope, facial asymmetry, speech difficulty, weakness, light-headedness and numbness.  All other systems reviewed and are negative.  Physical Exam Updated Vital Signs BP 122/84 (BP Location: Right Arm)    Pulse 67    Temp 97.9 F (36.6 C) (Oral)    Resp 18    Ht '5\' 6"'  (1.676 m)    Wt 96.2 kg    SpO2 96%    BMI 34.22 kg/m  Physical Exam Physical Exam  Constitutional: Pt is oriented to person, place, and time. Pt appears well-developed and well-nourished. No distress.  HENT:  Head: Normocephalic and atraumatic.  Mouth/Throat: Oropharynx is clear and moist.  Eyes: Conjunctivae and EOM are normal. Pupils are equal, round, and reactive to light. No scleral icterus.  No horizontal, vertical or rotational nystagmus  Neck: Normal range of motion. Neck supple.  Full active and passive ROM without pain No midline or paraspinal tenderness No nuchal rigidity or meningeal signs  Cardiovascular: Normal rate, regular rhythm and intact distal pulses.   Pulmonary/Chest:  Effort normal and breath sounds normal. No respiratory distress. Pt has no wheezes. No rales.  Abdominal: Soft. Bowel sounds are normal. There is no tenderness. There is no rebound and no guarding.  Musculoskeletal: Normal range of motion.  Lymphadenopathy:    No cervical adenopathy.  Neurological: Pt. is alert and oriented to person, place, and time. He has normal reflexes. No cranial nerve deficit.  Exhibits normal muscle tone. Coordination normal.  Mental Status:  Alert, oriented, thought content appropriate. Speech fluent without evidence of aphasia. Able to follow 2 step commands without difficulty.  Cranial Nerves:  II:  Peripheral visual fields grossly normal, pupils equal, round, reactive to light III,IV, VI: ptosis not present, extra-ocular motions intact bilaterally  V,VII: smile symmetric, facial light touch sensation equal VIII: hearing grossly normal bilaterally  IX,X: midline uvula rise  XI: bilateral shoulder  shrug equal and strong XII: midline tongue extension  Motor:  5/5 in upper and lower extremities bilaterally including strong and equal grip strength and dorsiflexion/plantar flexion Sensory: Pinprick and light touch normal in all extremities.  Deep Tendon Reflexes: 2+ and symmetric  Cerebellar: normal finger-to-nose with bilateral upper extremities Gait: normal gait and balance CV: distal pulses palpable throughout   Skin: Skin is warm and dry. No rash noted. Pt is not diaphoretic.  Psychiatric: Pt has a normal mood and affect. Behavior is normal. Judgment and thought content normal.  Nursing note and vitals reviewed.  ED Results / Procedures / Treatments   Labs (all labs ordered are listed, but only abnormal results are displayed) Labs Reviewed  CBC WITH DIFFERENTIAL/PLATELET - Abnormal; Notable for the following components:      Result Value   RBC 3.65 (*)    Hemoglobin 11.5 (*)    HCT 35.7 (*)    All other components within normal limits  BASIC METABOLIC  PANEL - Abnormal; Notable for the following components:   Chloride 113 (*)    CO2 18 (*)    Creatinine, Ser 1.66 (*)    GFR, Estimated 34 (*)    All other components within normal limits    EKG None  Radiology CT Head Wo Contrast  Result Date: 10/15/2021 CLINICAL DATA:  Migraine for the past 2 days. EXAM: CT HEAD WITHOUT CONTRAST TECHNIQUE: Contiguous axial images were obtained from the base of the skull through the vertex without intravenous contrast. RADIATION DOSE REDUCTION: This exam was performed according to the departmental dose-optimization program which includes automated exposure control, adjustment of the mA and/or kV according to patient size and/or use of iterative reconstruction technique. COMPARISON:  MRI brain dated December 02, 2020. FINDINGS: Brain: No evidence of acute infarction, hemorrhage, hydrocephalus, extra-axial collection or mass lesion/mass effect. Vascular: Atherosclerotic vascular calcification of the carotid siphons. No hyperdense vessel. Skull: Normal. Negative for fracture or focal lesion. Sinuses/Orbits: No acute finding. 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 Other: None. IMPRESSION: 1. No acute intracranial abnormality. Electronically Signed   By: Titus Dubin M.D.   On: 10/15/2021 18:50    Procedures Procedures    Medications Ordered in ED Medications  promethazine (PHENERGAN) 12.5 mg in sodium chloride 0.9 % 50 mL IVPB (0 mg Intravenous Stopped 10/15/21 1858)  HYDROmorphone (DILAUDID) injection 1 mg (1 mg Intravenous Given 10/15/21 1833)  diphenhydrAMINE (BENADRYL) injection 25 mg (25 mg Intravenous Given 10/15/21 1833)  dexamethasone (DECADRON) injection 10 mg (10 mg Intravenous Given 10/15/21 1834)  promethazine (PHENERGAN) 25 MG/ML injection (12.5 mg  Given 10/15/21 1844)   ED Course/ Medical Decision Making/ A&P   66 of history of frequent migraines followed by neurology here for evaluation of migraine.  She is afebrile, nonseptic, not  ill-appearing.  She is chronically anticoagulated however denies any head trauma.  No sudden onset thunderclap headache.  She has a nonfocal neuro exam without deficits.  While she does say this was similar to her frequent headache this is her third visit in 2 weeks.  She has not had any head imaging recently.  She does come with a "migraine cocktail" list of medications in her phone.  1 of these medications is Dilaudid.  I discussed with her my typical migraine cocktail which does not include opiates, also discussed rebound headache with Dilaudid type medication.  She voiced understanding however is requesting her typical cocktail.  We will also plan on checking some basic labs, CT head  given her frequency of visit and reassess.  Labs and imaging personally reviewed and interpreted:  CBC without leukocytosis, Hgb 11.5 similar to prior BMP creatinine 1.66 similar to prior Ct head without acute abnormality  Patient reassessed.  Headache resolved.  She feels back to her baseline.  Will DC home.  Encourage close follow-up with neurology given her migraines are chronic, return for new or worsening symptom.  Patient agreeable  Presentation is like pts typical HA and non concerning for [facility], ICH, Meningitis, or temporal arteritis, bleed, CVA.   The patient has been appropriately medically screened and/or stabilized in the ED. I have low suspicion for any other emergent medical condition which would require further screening, evaluation or treatment in the ED or require inpatient management.  Patient is hemodynamically stable and in no acute distress.  Patient able to ambulate in department prior to ED.  Evaluation does not show acute pathology that would require ongoing or additional emergent interventions while in the emergency department or further inpatient treatment.  I have discussed the diagnosis with the patient and answered all questions.  Pain is been managed while in the emergency department and  patient has no further complaints prior to discharge.  Patient is comfortable with plan discussed in room and is stable for discharge at this time.  I have discussed strict return precautions for returning to the emergency department.  Patient was encouraged to follow-up with PCP/specialist refer to at discharge.                             Medical Decision Making Amount and/or Complexity of Data Reviewed External Data Reviewed: labs, radiology and notes. Labs: ordered. Decision-making details documented in ED Course. Radiology: ordered and independent interpretation performed. Decision-making details documented in ED Course.  Risk OTC drugs. Prescription drug management. Risk Details: Do not feel patient needs additional labs, imaging, hospitalization at this time          Final Clinical Impression(s) / ED Diagnoses Final diagnoses:  Migraine without aura and without status migrainosus, not intractable    Rx / DC Orders ED Discharge Orders     None         Antron Seth A, PA-C 10/15/21 2030    Wyvonnia Dusky, MD 10/15/21 2316

## 2021-10-15 NOTE — Discharge Instructions (Signed)
Follow-up with neurology for your migraine  Return for new or worsening symptoms

## 2021-10-16 ENCOUNTER — Encounter: Payer: Self-pay | Admitting: Cardiology

## 2021-10-16 ENCOUNTER — Other Ambulatory Visit: Payer: Self-pay | Admitting: Family Medicine

## 2021-10-16 DIAGNOSIS — G43109 Migraine with aura, not intractable, without status migrainosus: Secondary | ICD-10-CM

## 2021-10-19 MED ORDER — METOPROLOL SUCCINATE ER 50 MG PO TB24: 50.0000 mg | ORAL_TABLET | Freq: Every day | ORAL | 3 refills | Status: DC

## 2021-10-20 ENCOUNTER — Encounter: Payer: Self-pay | Admitting: Family Medicine

## 2021-10-20 ENCOUNTER — Ambulatory Visit: Payer: Self-pay

## 2021-10-20 ENCOUNTER — Ambulatory Visit (INDEPENDENT_AMBULATORY_CARE_PROVIDER_SITE_OTHER): Payer: Managed Care, Other (non HMO) | Admitting: Family Medicine

## 2021-10-20 VITALS — BP 120/76 | Ht 66.0 in | Wt 210.0 lb

## 2021-10-20 VITALS — BP 120/82 | HR 65 | Temp 97.8°F | Ht 66.0 in | Wt 210.0 lb

## 2021-10-20 DIAGNOSIS — F319 Bipolar disorder, unspecified: Secondary | ICD-10-CM | POA: Diagnosis not present

## 2021-10-20 DIAGNOSIS — R197 Diarrhea, unspecified: Secondary | ICD-10-CM

## 2021-10-20 DIAGNOSIS — R112 Nausea with vomiting, unspecified: Secondary | ICD-10-CM | POA: Diagnosis not present

## 2021-10-20 DIAGNOSIS — F339 Major depressive disorder, recurrent, unspecified: Secondary | ICD-10-CM | POA: Diagnosis not present

## 2021-10-20 DIAGNOSIS — M778 Other enthesopathies, not elsewhere classified: Secondary | ICD-10-CM | POA: Insufficient documentation

## 2021-10-20 DIAGNOSIS — M24112 Other articular cartilage disorders, left shoulder: Secondary | ICD-10-CM | POA: Diagnosis not present

## 2021-10-20 DIAGNOSIS — M25512 Pain in left shoulder: Secondary | ICD-10-CM

## 2021-10-20 HISTORY — DX: Other enthesopathies, not elsewhere classified: M77.8

## 2021-10-20 MED ORDER — QUETIAPINE FUMARATE 300 MG PO TABS: 300.0000 mg | ORAL_TABLET | Freq: Every day | ORAL | 2 refills | Status: DC

## 2021-10-20 MED ORDER — ONDANSETRON 4 MG PO TBDP: 4.0000 mg | ORAL_TABLET | Freq: Three times a day (TID) | ORAL | 2 refills | Status: DC | PRN

## 2021-10-20 MED ORDER — BUPROPION HCL ER (XL) 300 MG PO TB24: 300.0000 mg | ORAL_TABLET | Freq: Every day | ORAL | 2 refills | Status: DC

## 2021-10-20 MED ORDER — PANTOPRAZOLE SODIUM 40 MG PO TBEC: 40.0000 mg | DELAYED_RELEASE_TABLET | Freq: Two times a day (BID) | ORAL | 2 refills | Status: DC

## 2021-10-20 MED ORDER — LURASIDONE HCL 120 MG PO TABS: 120.0000 mg | ORAL_TABLET | Freq: Every evening | ORAL | 2 refills | Status: DC

## 2021-10-20 MED ORDER — TRIAMCINOLONE ACETONIDE 40 MG/ML IJ SUSP
40.0000 mg | Freq: Once | INTRAMUSCULAR | Status: AC
  Administered 2021-10-20: 40 mg via INTRA_ARTICULAR

## 2021-10-20 NOTE — Assessment & Plan Note (Signed)
Acutely occurring.  Has good range of motion.  Symptoms more consistent with joint with degenerative labral changes appreciated. -Counseled on home exercise therapy and supportive care. -Injection today. -Referral to physical therapy. -Could consider further imaging.

## 2021-10-20 NOTE — Patient Instructions (Signed)
Keep pushing fluids.   We will be in touch regarding your stool sample.  Take Zofran prior to meals.  Take Protonix twice daily for the next few weeks.   Let us know if you need anything.

## 2021-10-20 NOTE — Progress Notes (Signed)
Chief Complaint  Patient presents with   Follow-up    Past 2 weeks nausea and vomiting     Subjective Paula Stout is a 66 y.o. female who presents with vomiting and nausea. Symptoms began 2 weeks ago.  Patient has vomiting and diarrhea Patient denies URI symptoms Treatment to date: Imodium  Flared by eating.  Down 8-10 lbs.  She is able to keep liquids.  Sick contacts: none known She takes Protonix 40 mg/d for GERD. Does not feel like this.   Past Medical History:  Diagnosis Date   A-fib [facility])    Abscess 02/03/2019   Acute cystitis without hematuria 05/14/2017   Last Assessment & Plan:  Formatting of this note might be different from the original. - suprapubic tenderness with symptoms of urinary frequency and urgency and subjective fevers (no fevers recorded on any visit). Recently passed a renal stone, US shows left hydronephrosis which is improving.  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 A:  Chronic.  Sinus rhythm at this time.  States she was told this during admission at [facility] in the past. P:  On baby aspirin at home will continue.  Low dose metoprolol started.   Bipolar 1 disorder ([place]) 10/11/2013   Bipolar 1 disorder, depressed, moderate ([place]) 02/18/2020   Bipolar disorder ([place])    Cellulitis 02/03/2019   Chest pain 02/19/2020   Chronic anticoagulation 02/20/2020   Chronic atrial fibrillation ([place]) 02/18/2020   Chronic headache 10/11/2013   Chronic migraine without aura, intractable, without status migrainosus 06/24/2017   Chronic pain of both shoulders 11/14/2015   Colon cancer screening 04/15/2016   Last Assessment & Plan:  Formatting of  this note might be different from the original. Will schedule for colonoscopy   Depressed bipolar I disorder ([place]) 02/06/2021   Depression    Depression, recurrent ([place]) 02/18/2020   Diabetes mellitus ([place])    Diabetes mellitus due to underlying condition with unspecified complications ([place]) 05/13/6733   Diabetes mellitus type 2 in obese ([place]) 10/11/2013   Last Assessment & Plan:  Formatting of this note might be different from the original. A:  Diabetes Mellitus Type 2 without long term use of insulin P:  Hold Home metformin. Reg ISS.   Diabetes mellitus without complication ([place])    Dyslipidemia 02/20/2020   Epilepsy ([place]) 02/06/2021   GAD (generalized anxiety disorder) 02/18/2020   Heartburn 04/15/2016   Last Assessment & Plan:  Formatting of this note might be different from the original. Patient was counseled regarding lifestyle modification  Advised to take Prilosec 30 mins before breakfast  Will schedule for EGD   History of atrial fibrillation 06/24/2017   History of posttraumatic stress disorder (PTSD) 10/07/2016   HLD (hyperlipidemia) 09/01/2014   Last Assessment & Plan:  Formatting of this note might be different from the original. A: Chronic. P: Continue statin.   Hyperglycemia due to type 2 diabetes mellitus ([place]) 02/06/2021   Hyperosmolarity due to secondary diabetes mellitus ([place]) 02/06/2021   Hypertriglyceridemia 02/18/2020   Impaired mobility and activities of daily living 11/14/2015   Insomnia 02/06/2021   Irritable bowel  syndrome (IBS)    Kidney stone 02/06/2021   Left elbow pain 11/14/2015   Migraine without aura, not refractory 02/06/2021   Migraines 10/11/2013   Last Assessment & Plan:  Formatting of this note might be different from the original. A: Chronic. P: Continue Topamax. (also takes it for bipolar)   Morbid obesity ([place]) 12/16/2016   Multiple sclerosis ([place]) 06/24/2017   Obesity (BMI 30-39.9) 09/01/2014   Last Assessment & Plan:  Formatting of this note might be different from the  original. A: Morbid obesity, with BMI of 35.0 - 39.9 P: Counseled to loose wt.   Paroxysmal atrial fibrillation (HCC) 04/14/2020   Polyneuropathy due to type 2 diabetes mellitus ([place]) 02/06/2021   Refractory migraine with aura 02/06/2021   Refractory migraine without aura 11/06/1222   Renal colic on left side 04/30/36   Seizure disorder ([place]) 06/24/2017   Sleep apnea 02/06/2021   Sleep-disordered breathing 06/05/2018   Stage 3 chronic kidney disease ([place]) 02/20/2020   Unstable angina ([place]) 09/01/2014   Last Assessment & Plan:  Formatting of this note might be different from the original. A: Pt has 2 months exertional angina, recently developed angina at rest intermittently.  She had a positive nuclear medicine cardiolyte stress test with anteroapical ischemia on stress portion not seen at rest. P:  - continue aspirin, statin, metoprolol - Discussed stress test findings with cardiologist Dr. Landis Gandy   Urinary tract obstruction 02/06/2021    Exam BP 120/82    Pulse 65    Temp 97.8 F (36.6 C) (Oral)    Ht 5\' 6"  (1.676 m)    Wt 210 lb (95.3 kg)    SpO2 99%    BMI 33.89 kg/m  General:  well developed, well hydrated, in no apparent distress Skin:  warm, no pallor or diaphoresis, no rashes Throat/Pharynx:  lips and gingiva without lesion; tongue and uvula midline; non-inflamed pharynx; no exudates or postnasal drainage Lungs:  clear to auscultation, breath sounds equal bilaterally, no respiratory distress, no wheezes Cardio:  RRR Abdomen:  abdomen soft, diffuse ttp, worse in epigastric region and LLQ region; bowel sounds normal; no masses or organomegaly Psych: Appropriate judgement/insight  Assessment and Plan  Diarrhea, unspecified type - Plan: GI Profile, Stool, PCR, CANCELED: GI Profile, Stool, PCR  Nausea and vomiting, unspecified vomiting type - Plan: pantoprazole (PROTONIX) 40 MG tablet, ondansetron (ZOFRAN-ODT) 4 MG disintegrating tablet  Bipolar 1 disorder (HCC) - Plan: QUEtiapine (SEROQUEL) 300  MG tablet  Depression, recurrent (HCC) - Plan: buPROPion (WELLBUTRIN XL) 300 MG 24 hr tablet  Ck stool cx. Take Protonix bid instead of qd. Zofran before meals. Stay hydrated.  Avoid aggravating foods, discussed advancing diet. F/u if symptoms fail to improve, sooner if worsening. The patient voiced understanding and agreement to the plan.  [place], DO 10/20/21  10:22 AM

## 2021-10-20 NOTE — Progress Notes (Signed)
Paula Stout - 66 y.o. female MRN [id number]  Date of birth: 1956/05/18  SUBJECTIVE:  Including CC & ROS.  No chief complaint on file.   Paula Stout is a 66 y.o. female that is presenting with acute left shoulder pain.  The pain is worse with reaching across the body or sleeping on the affected side.  Has been ongoing for about a month.  Review of the note from 1/11 shows she was provided prednisone and Zanaflex. Review of the note from 2/3 6 she was counseled on supportive care and provided tramadol.  Review of Systems See HPI   HISTORY: Past Medical, Surgical, Social, and Family History Reviewed & Updated per EMR.   Pertinent Historical Findings include:  Past Medical History:  Diagnosis Date   A-fib ([place])    Abscess 02/03/2019   Acute cystitis without hematuria 05/14/2017   Last Assessment & Plan:  Formatting of this note might be different from the original. - suprapubic tenderness with symptoms of urinary frequency and urgency and subjective fevers (no fevers recorded on any visit). Recently passed a renal stone, US shows left hydronephrosis which is improving.  - UA negative8/24 but pt is clinically symptomatic - will repeat UA  - start pt on Microbid for 5 days   Adhesive capsulitis of left shoulder 12/16/2016   Anxiety    Asthma    Asthma without status asthmaticus 02/06/2021   At risk for falls 02/04/2019   Atrial fibrillation ([place]) 09/01/2014   Last Assessment & Plan:  Formatting of this note might be different from the original. A:  Chronic.  Sinus rhythm at this time.  States she was told this during admission at [facility] in the past. P:  On baby aspirin at home will continue.  Low dose metoprolol started.   Bipolar 1 disorder ([place]) 10/11/2013   Bipolar 1 disorder, depressed, moderate ([place]) 02/18/2020   Bipolar disorder ([place])    Cellulitis 02/03/2019   Chest pain 02/19/2020   Chronic anticoagulation 02/20/2020   Chronic atrial fibrillation ([place]) 02/18/2020   Chronic headache  10/11/2013   Chronic migraine without aura, intractable, without status migrainosus 06/24/2017   Chronic pain of both shoulders 11/14/2015   Colon cancer screening 04/15/2016   Last Assessment & Plan:  Formatting of this note might be different from the original. Will schedule for colonoscopy   Depressed bipolar I disorder ([place]) 02/06/2021   Depression    Depression, recurrent ([place]) 02/18/2020   Diabetes mellitus ([place])    Diabetes mellitus due to underlying condition with unspecified complications ([place]) 09/15/5091   Diabetes mellitus type 2 in obese ([place]) 10/11/2013   Last Assessment & Plan:  Formatting of this note might be different from the original. A:  Diabetes Mellitus Type 2 without long term use of insulin P:  Hold Home metformin. Reg ISS.   Diabetes mellitus without complication ([place])    Dyslipidemia 02/20/2020   Epilepsy ([place]) 02/06/2021   GAD (generalized anxiety disorder) 02/18/2020   Heartburn 04/15/2016   Last Assessment & Plan:  Formatting of this note might be different from the original. Patient was counseled regarding lifestyle modification  Advised to take Prilosec 30 mins before breakfast  Will schedule for EGD   History of atrial fibrillation 06/24/2017   History of posttraumatic stress disorder (PTSD) 10/07/2016   HLD (hyperlipidemia) 09/01/2014   Last Assessment & Plan:  Formatting of this note might be different from the original. A: Chronic. P: Continue statin.   Hyperglycemia due to type 2  diabetes mellitus ([place]) 02/06/2021   Hyperosmolarity due to secondary diabetes mellitus ([place]) 02/06/2021   Hypertriglyceridemia 02/18/2020   Impaired mobility and activities of daily living 11/14/2015   Insomnia 02/06/2021   Irritable bowel syndrome (IBS)    Kidney stone 02/06/2021   Left elbow pain 11/14/2015   Migraine without aura, not refractory 02/06/2021   Migraines 10/11/2013   Last Assessment & Plan:  Formatting of this note might be different from the original. A: Chronic. P: Continue Topamax.  (also takes it for bipolar)   Morbid obesity ([place]) 12/16/2016   Multiple sclerosis ([place]) 06/24/2017   Obesity (BMI 30-39.9) 09/01/2014   Last Assessment & Plan:  Formatting of this note might be different from the original. A: Morbid obesity, with BMI of 35.0 - 39.9 P: Counseled to loose wt.   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 A: Pt has 2 months exertional angina, recently developed angina at rest intermittently.  She had a positive nuclear medicine cardiolyte stress test with anteroapical ischemia on stress portion not seen at rest. P:  - continue aspirin, statin, metoprolol - Discussed stress test findings with cardiologist Dr. Landis Gandy   Urinary tract obstruction 02/06/2021    Past Surgical History:  Procedure Laterality Date   ABDOMINAL HYSTERECTOMY     Fibroids   ATRIAL FIBRILLATION ABLATION     CARDIAC CATHETERIZATION     CESAREAN SECTION     KNEE SURGERY     LEFT HEART CATH AND CORONARY ANGIOGRAPHY N/A 03/04/2020   Procedure: LEFT HEART CATH AND CORONARY ANGIOGRAPHY;  Surgeon: Belva Crome, MD;  Location: [facility];  Service: Cardiovascular;  Laterality: N/A;   ROTATOR CUFF REPAIR       PHYSICAL EXAM:  VS: BP 120/76 (BP Location: Left Arm, Patient Position: Sitting)    Ht 5\' 6"  (1.676 m)    Wt 210 lb (95.3 kg)    BMI 33.89 kg/m  Physical Exam Gen: NAD, alert, cooperative with exam, well-appearing MSK:  Neurovascularly intact    Limited ultrasound: Left shoulder:  Mild effusion of the superior aspect  bicipital tendon Effusion changes appreciated overlying the subscapularis. Normal-appearing supraspinatus. Mild effusion of the posterior glenohumeral joint.  Summary: Mild effusion within the shoulder joint.  Ultrasound and interpretation by Clearance Coots, MD   Aspiration/Injection Procedure Note Savannha Welle 10-Sep-1955  Procedure: Injection Indications: Left shoulder pain  Procedure Details Consent: Risks of procedure as well as the alternatives and risks of each were explained to the (patient/caregiver).  Consent for procedure obtained. Time Out: Verified patient identification, verified procedure, site/side was marked, verified correct patient position, special equipment/implants available, medications/allergies/relevent history reviewed, required imaging and test results available.  Performed.  The area was cleaned with iodine and alcohol swabs.    The left glenohumeral joint was injected using 3 cc of 1% lidocaine on a 22-gauge 3-1/2 inch needle.  The syringe was switched and a mixture containing 1 cc's of 40 mg Kenalog and 4 cc's of 0.25% bupivacaine was injected.  Ultrasound was used. Images were  obtained in short views showing the injection.     A sterile dressing was applied.  Patient did tolerate procedure well.    ASSESSMENT & PLAN:   Labral tear of shoulder, degenerative, left Acutely occurring.  Has good range of motion.  Symptoms more consistent with joint with degenerative labral changes appreciated. -Counseled on home exercise therapy and supportive care. -Injection today. -Referral to physical therapy. -Could consider further imaging.

## 2021-10-20 NOTE — Patient Instructions (Signed)
Nice to meet you  Please try heat before exercise and ice after  Please try physical therapy  Please send me a message in [place] with any questions or updates.  Please see me back in 4 weeks.   --Dr. Raeford Razor

## 2021-10-22 ENCOUNTER — Telehealth: Payer: Self-pay

## 2021-10-22 NOTE — Telephone Encounter (Signed)
Submitted PA on CMM.  Key: [id number].  Waiting on determination from [place].  your information has been submitted and will be reviewed by [place]. You may close this dialog, return to your dashboard, and perform other tasks. An electronic determination will be received in CoverMyMeds within 72-120 hours. You can see the latest determination by locating this request on your dashboard or by reopening this request. You will receive a fax copy of the determination. If Paula Stout has not responded in 120 hours, contact [employer] at [phone].

## 2021-11-03 NOTE — Telephone Encounter (Signed)
Received fax from [place] that [place] approved 10/22/21-05/02/22. Request ID: [id number].

## 2021-11-05 ENCOUNTER — Other Ambulatory Visit: Payer: Self-pay

## 2021-11-05 ENCOUNTER — Emergency Department (HOSPITAL_BASED_OUTPATIENT_CLINIC_OR_DEPARTMENT_OTHER): Payer: Commercial Managed Care - HMO

## 2021-11-05 ENCOUNTER — Encounter (HOSPITAL_BASED_OUTPATIENT_CLINIC_OR_DEPARTMENT_OTHER): Payer: Self-pay

## 2021-11-05 ENCOUNTER — Observation Stay (HOSPITAL_BASED_OUTPATIENT_CLINIC_OR_DEPARTMENT_OTHER)
Admission: EM | Admit: 2021-11-05 | Discharge: 2021-11-06 | Disposition: A | Discharge status: Home / Self Care | Payer: Commercial Managed Care - HMO | Attending: Internal Medicine | Admitting: Internal Medicine

## 2021-11-05 DIAGNOSIS — K219 Gastro-esophageal reflux disease without esophagitis: Secondary | ICD-10-CM

## 2021-11-05 DIAGNOSIS — I4719 Other supraventricular tachycardia: Secondary | ICD-10-CM

## 2021-11-05 DIAGNOSIS — I480 Paroxysmal atrial fibrillation: Secondary | ICD-10-CM | POA: Diagnosis not present

## 2021-11-05 DIAGNOSIS — I129 Hypertensive chronic kidney disease with stage 1 through stage 4 chronic kidney disease, or unspecified chronic kidney disease: Secondary | ICD-10-CM | POA: Insufficient documentation

## 2021-11-05 DIAGNOSIS — Z794 Long term (current) use of insulin: Secondary | ICD-10-CM | POA: Insufficient documentation

## 2021-11-05 DIAGNOSIS — Z20822 Contact with and (suspected) exposure to covid-19: Secondary | ICD-10-CM | POA: Insufficient documentation

## 2021-11-05 DIAGNOSIS — Z7901 Long term (current) use of anticoagulants: Secondary | ICD-10-CM | POA: Insufficient documentation

## 2021-11-05 DIAGNOSIS — R072 Precordial pain: Secondary | ICD-10-CM | POA: Diagnosis present

## 2021-11-05 DIAGNOSIS — Z7984 Long term (current) use of oral hypoglycemic drugs: Secondary | ICD-10-CM | POA: Diagnosis not present

## 2021-11-05 DIAGNOSIS — N183 Chronic kidney disease, stage 3 unspecified: Secondary | ICD-10-CM | POA: Insufficient documentation

## 2021-11-05 DIAGNOSIS — I471 Supraventricular tachycardia, unspecified: Secondary | ICD-10-CM

## 2021-11-05 DIAGNOSIS — G43909 Migraine, unspecified, not intractable, without status migrainosus: Secondary | ICD-10-CM | POA: Diagnosis present

## 2021-11-05 DIAGNOSIS — E1122 Type 2 diabetes mellitus with diabetic chronic kidney disease: Secondary | ICD-10-CM | POA: Diagnosis not present

## 2021-11-05 DIAGNOSIS — E1169 Type 2 diabetes mellitus with other specified complication: Secondary | ICD-10-CM | POA: Diagnosis not present

## 2021-11-05 DIAGNOSIS — Z79899 Other long term (current) drug therapy: Secondary | ICD-10-CM | POA: Insufficient documentation

## 2021-11-05 DIAGNOSIS — F3132 Bipolar disorder, current episode depressed, moderate: Secondary | ICD-10-CM | POA: Diagnosis present

## 2021-11-05 DIAGNOSIS — R231 Pallor: Secondary | ICD-10-CM | POA: Diagnosis not present

## 2021-11-05 DIAGNOSIS — J45909 Unspecified asthma, uncomplicated: Secondary | ICD-10-CM | POA: Diagnosis not present

## 2021-11-05 DIAGNOSIS — E669 Obesity, unspecified: Secondary | ICD-10-CM

## 2021-11-05 DIAGNOSIS — R079 Chest pain, unspecified: Secondary | ICD-10-CM | POA: Diagnosis present

## 2021-11-05 DIAGNOSIS — I4891 Unspecified atrial fibrillation: Secondary | ICD-10-CM | POA: Diagnosis present

## 2021-11-05 DIAGNOSIS — E785 Hyperlipidemia, unspecified: Secondary | ICD-10-CM

## 2021-11-05 HISTORY — DX: Other supraventricular tachycardia: I47.19

## 2021-11-05 HISTORY — DX: Gastro-esophageal reflux disease without esophagitis: K21.9

## 2021-11-05 HISTORY — DX: Supraventricular tachycardia, unspecified: I47.10

## 2021-11-05 HISTORY — DX: Supraventricular tachycardia: I47.1

## 2021-11-05 LAB — CBC WITH DIFFERENTIAL/PLATELET
Abs Immature Granulocytes: 0.03 10*3/uL (ref 0.00–0.07)
Basophils Absolute: 0.1 10*3/uL (ref 0.0–0.1)
Basophils Relative: 1 %
Eosinophils Absolute: 0.3 10*3/uL (ref 0.0–0.5)
Eosinophils Relative: 4 %
HCT: 38.8 % (ref 36.0–46.0)
Hemoglobin: 12.5 g/dL (ref 12.0–15.0)
Immature Granulocytes: 0 %
Lymphocytes Relative: 31 %
Lymphs Abs: 2.7 10*3/uL (ref 0.7–4.0)
MCH: 31.3 pg (ref 26.0–34.0)
MCHC: 32.2 g/dL (ref 30.0–36.0)
MCV: 97.2 fL (ref 80.0–100.0)
Monocytes Absolute: 0.7 10*3/uL (ref 0.1–1.0)
Monocytes Relative: 7 %
Neutro Abs: 5 10*3/uL (ref 1.7–7.7)
Neutrophils Relative %: 57 %
Platelets: 363 10*3/uL (ref 150–400)
RBC: 3.99 MIL/uL (ref 3.87–5.11)
RDW: 15.5 % (ref 11.5–15.5)
WBC: 8.8 10*3/uL (ref 4.0–10.5)
nRBC: 0 % (ref 0.0–0.2)

## 2021-11-05 LAB — MAGNESIUM: Magnesium: 1.4 mg/dL — ABNORMAL LOW (ref 1.7–2.4)

## 2021-11-05 LAB — RESP PANEL BY RT-PCR (FLU A&B, COVID) ARPGX2
Influenza A by PCR: NEGATIVE
Influenza B by PCR: NEGATIVE
SARS Coronavirus 2 by RT PCR: NEGATIVE

## 2021-11-05 LAB — COMPREHENSIVE METABOLIC PANEL
ALT: 18 U/L (ref 0–44)
AST: 22 U/L (ref 15–41)
Albumin: 3.4 g/dL — ABNORMAL LOW (ref 3.5–5.0)
Alkaline Phosphatase: 41 U/L (ref 38–126)
Anion gap: 9 (ref 5–15)
BUN: 20 mg/dL (ref 8–23)
CO2: 17 mmol/L — ABNORMAL LOW (ref 22–32)
Calcium: 9.2 mg/dL (ref 8.9–10.3)
Chloride: 114 mmol/L — ABNORMAL HIGH (ref 98–111)
Creatinine, Ser: 1.78 mg/dL — ABNORMAL HIGH (ref 0.44–1.00)
GFR, Estimated: 31 mL/min — ABNORMAL LOW (ref >60)
Glucose, Bld: 201 mg/dL — ABNORMAL HIGH (ref 70–99)
Potassium: 4.1 mmol/L (ref 3.5–5.1)
Sodium: 140 mmol/L (ref 135–145)
Total Bilirubin: 0.6 mg/dL (ref 0.3–1.2)
Total Protein: 6.5 g/dL (ref 6.5–8.1)

## 2021-11-05 LAB — TROPONIN I (HIGH SENSITIVITY)
Troponin I (High Sensitivity): 5 ng/L (ref <18)
Troponin I (High Sensitivity): 25 ng/L — ABNORMAL HIGH (ref <18)

## 2021-11-05 LAB — GLUCOSE, CAPILLARY: Glucose-Capillary: 120 mg/dL — ABNORMAL HIGH (ref 70–99)

## 2021-11-05 MED ORDER — PANTOPRAZOLE SODIUM 40 MG PO TBEC
40.0000 mg | DELAYED_RELEASE_TABLET | Freq: Two times a day (BID) | ORAL | Status: DC
  Administered 2021-11-06 (×2): 40 mg via ORAL
  Filled 2021-11-05 (×2): qty 1

## 2021-11-05 MED ORDER — BUPROPION HCL ER (XL) 150 MG PO TB24
300.0000 mg | ORAL_TABLET | Freq: Every day | ORAL | Status: DC
  Administered 2021-11-06: 300 mg via ORAL
  Filled 2021-11-05 (×2): qty 2

## 2021-11-05 MED ORDER — ADENOSINE 6 MG/2ML IV SOLN
INTRAVENOUS | Status: AC
  Filled 2021-11-05: qty 6

## 2021-11-05 MED ORDER — LEVOCETIRIZINE DIHYDROCHLORIDE 5 MG PO TABS: 5.0000 mg | ORAL_TABLET | Freq: Every evening | ORAL | Status: DC

## 2021-11-05 MED ORDER — FLECAINIDE ACETATE 50 MG PO TABS
50.0000 mg | ORAL_TABLET | Freq: Once | ORAL | Status: AC
  Administered 2021-11-05: 50 mg via ORAL
  Filled 2021-11-05: qty 1

## 2021-11-05 MED ORDER — SODIUM CHLORIDE 0.9 % IV SOLN
INTRAVENOUS | Status: AC | PRN
  Administered 2021-11-05: 1000 mL via INTRAVENOUS

## 2021-11-05 MED ORDER — METOPROLOL SUCCINATE ER 50 MG PO TB24
50.0000 mg | ORAL_TABLET | Freq: Every day | ORAL | Status: DC
Start: 2021-11-06 — End: 2021-11-06
  Administered 2021-11-06: 50 mg via ORAL
  Filled 2021-11-05: qty 1

## 2021-11-05 MED ORDER — TRAZODONE HCL 50 MG PO TABS: 25.0000 mg | ORAL_TABLET | Freq: Every evening | ORAL | Status: DC | PRN

## 2021-11-05 MED ORDER — QUETIAPINE FUMARATE 100 MG PO TABS
300.0000 mg | ORAL_TABLET | Freq: Every day | ORAL | Status: DC
  Administered 2021-11-05: 300 mg via ORAL
  Filled 2021-11-05: qty 3

## 2021-11-05 MED ORDER — LIDOCAINE VISCOUS HCL 2 % MT SOLN
15.0000 mL | Freq: Once | OROMUCOSAL | Status: DC
  Filled 2021-11-05: qty 15

## 2021-11-05 MED ORDER — SODIUM CHLORIDE 0.9 % IV BOLUS
1000.0000 mL | Freq: Once | INTRAVENOUS | Status: AC
  Administered 2021-11-05: 1000 mL via INTRAVENOUS

## 2021-11-05 MED ORDER — FENTANYL CITRATE PF 50 MCG/ML IJ SOSY
50.0000 ug | PREFILLED_SYRINGE | Freq: Once | INTRAMUSCULAR | Status: AC
Start: 2021-11-05 — End: 2021-11-05
  Administered 2021-11-05: 50 ug via INTRAVENOUS
  Filled 2021-11-05: qty 1

## 2021-11-05 MED ORDER — APIXABAN 2.5 MG PO TABS
5.0000 mg | ORAL_TABLET | Freq: Once | ORAL | Status: AC
  Administered 2021-11-05: 5 mg via ORAL
  Filled 2021-11-05: qty 2

## 2021-11-05 MED ORDER — ALUM & MAG HYDROXIDE-SIMETH 200-200-20 MG/5ML PO SUSP
30.0000 mL | Freq: Once | ORAL | Status: AC
  Administered 2021-11-05: 30 mL via ORAL
  Filled 2021-11-05: qty 30

## 2021-11-05 MED ORDER — NITROGLYCERIN 0.4 MG SL SUBL: 0.4000 mg | SUBLINGUAL_TABLET | SUBLINGUAL | Status: DC | PRN

## 2021-11-05 MED ORDER — ISOSORBIDE MONONITRATE ER 30 MG PO TB24
15.0000 mg | ORAL_TABLET | Freq: Every day | ORAL | Status: DC
  Administered 2021-11-05 – 2021-11-06 (×2): 15 mg via ORAL
  Filled 2021-11-05 (×2): qty 1

## 2021-11-05 MED ORDER — ONDANSETRON 4 MG PO TBDP
4.0000 mg | ORAL_TABLET | Freq: Three times a day (TID) | ORAL | Status: DC | PRN
  Filled 2021-11-05: qty 1

## 2021-11-05 MED ORDER — FENOFIBRATE 160 MG PO TABS
160.0000 mg | ORAL_TABLET | Freq: Every day | ORAL | Status: DC
  Administered 2021-11-05 – 2021-11-06 (×2): 160 mg via ORAL
  Filled 2021-11-05 (×2): qty 1

## 2021-11-05 MED ORDER — ADENOSINE 6 MG/2ML IV SOLN
INTRAVENOUS | Status: AC | PRN
  Administered 2021-11-05: 6 mg via INTRAVENOUS

## 2021-11-05 MED ORDER — MAGNESIUM HYDROXIDE 400 MG/5ML PO SUSP: 30.0000 mL | Freq: Every day | ORAL | Status: DC | PRN

## 2021-11-05 MED ORDER — APIXABAN 5 MG PO TABS
5.0000 mg | ORAL_TABLET | Freq: Two times a day (BID) | ORAL | Status: DC
  Administered 2021-11-05 – 2021-11-06 (×2): 5 mg via ORAL
  Filled 2021-11-05 (×2): qty 1

## 2021-11-05 MED ORDER — METOPROLOL SUCCINATE ER 50 MG PO TB24
50.0000 mg | ORAL_TABLET | Freq: Every day | ORAL | Status: DC
  Administered 2021-11-05: 50 mg via ORAL
  Filled 2021-11-05: qty 2

## 2021-11-05 MED ORDER — FLECAINIDE ACETATE 50 MG PO TABS
50.0000 mg | ORAL_TABLET | Freq: Two times a day (BID) | ORAL | Status: DC
Start: 2021-11-05 — End: 2021-11-06
  Administered 2021-11-06 (×2): 50 mg via ORAL
  Filled 2021-11-05 (×3): qty 1

## 2021-11-05 MED ORDER — LURASIDONE HCL 40 MG PO TABS
120.0000 mg | ORAL_TABLET | Freq: Every day | ORAL | Status: DC
  Administered 2021-11-06: 120 mg via ORAL
  Filled 2021-11-05 (×2): qty 3

## 2021-11-05 MED ORDER — MORPHINE SULFATE (PF) 2 MG/ML IV SOLN: 2.0000 mg | Freq: Once | INTRAVENOUS | Status: DC

## 2021-11-05 MED ORDER — ONDANSETRON HCL 4 MG/2ML IJ SOLN: 4.0000 mg | Freq: Four times a day (QID) | INTRAMUSCULAR | Status: DC | PRN

## 2021-11-05 MED ORDER — LORATADINE 10 MG PO TABS
10.0000 mg | ORAL_TABLET | Freq: Every day | ORAL | Status: DC
  Administered 2021-11-06: 10 mg via ORAL
  Filled 2021-11-05: qty 1

## 2021-11-05 MED ORDER — SODIUM CHLORIDE 0.9 % IV SOLN: INTRAVENOUS | Status: DC

## 2021-11-05 MED ORDER — METOPROLOL TARTRATE 5 MG/5ML IV SOLN
5.0000 mg | Freq: Once | INTRAVENOUS | Status: AC
  Administered 2021-11-05: 5 mg via INTRAVENOUS
  Filled 2021-11-05: qty 5

## 2021-11-05 MED ORDER — ADENOSINE 6 MG/2ML IV SOLN: 6.0000 mg | Freq: Once | INTRAVENOUS | Status: DC

## 2021-11-05 MED ORDER — TOPIRAMATE 25 MG PO TABS
150.0000 mg | ORAL_TABLET | Freq: Two times a day (BID) | ORAL | Status: DC
  Administered 2021-11-05 – 2021-11-06 (×3): 150 mg via ORAL
  Filled 2021-11-05 (×3): qty 6

## 2021-11-05 MED ORDER — TIZANIDINE HCL 4 MG PO TABS: 4.0000 mg | ORAL_TABLET | Freq: Four times a day (QID) | ORAL | Status: DC | PRN

## 2021-11-05 MED ORDER — DULAGLUTIDE 1.5 MG/0.5ML ~~LOC~~ SOAJ: 1.5000 mg | SUBCUTANEOUS | Status: DC

## 2021-11-05 MED ORDER — UBROGEPANT 100 MG PO TABS: 100.0000 mg | ORAL_TABLET | ORAL | Status: DC | PRN

## 2021-11-05 MED ORDER — ACETAMINOPHEN 325 MG PO TABS
650.0000 mg | ORAL_TABLET | ORAL | Status: DC | PRN
  Administered 2021-11-06: 650 mg via ORAL
  Filled 2021-11-05: qty 2

## 2021-11-05 MED ORDER — ALPRAZOLAM 0.25 MG PO TABS: 0.2500 mg | ORAL_TABLET | Freq: Two times a day (BID) | ORAL | Status: DC | PRN

## 2021-11-05 MED ORDER — EPINEPHRINE 0.3 MG/0.3ML IJ SOAJ: 0.3000 mg | INTRAMUSCULAR | Status: DC | PRN

## 2021-11-05 MED ORDER — ATORVASTATIN CALCIUM 80 MG PO TABS
80.0000 mg | ORAL_TABLET | Freq: Every day | ORAL | Status: DC
  Administered 2021-11-05 – 2021-11-06 (×2): 80 mg via ORAL
  Filled 2021-11-05 (×2): qty 1

## 2021-11-05 MED ORDER — ALBUTEROL SULFATE (2.5 MG/3ML) 0.083% IN NEBU: 3.0000 mL | INHALATION_SOLUTION | RESPIRATORY_TRACT | Status: DC | PRN

## 2021-11-05 MED ORDER — ADENOSINE 6 MG/2ML IV SOLN
INTRAVENOUS | Status: AC
  Filled 2021-11-05: qty 2

## 2021-11-05 NOTE — ED Notes (Signed)
[facility] given report.  

## 2021-11-05 NOTE — Assessment & Plan Note (Signed)
-   This has already converted to normal sinus rhythm after being given flecainide, Toprol-XL and albumin of IV Lopressor in addition to hydration with IV normal saline.[name] ?

## 2021-11-05 NOTE — ED Provider Notes (Addendum)
[place] EMERGENCY DEPARTMENT Provider Note   CSN: [id number] Arrival date & time: 11/05/21  1137     History  Chief Complaint  Patient presents with   Chest Pain    Paula Stout is a 66 y.o. female.  States she felt fine last evening she awoke this morning at about 830 with substernal chest pain nonradiating.  Not associated with shortness of breath may be some nausea but no vomiting.  Patient felt lightheaded and felt like she was in a pass out.  Patient's blood pressure out in triage was 89/72 and got her back to the room and had her laying down it was in the 169 systolic range.  Patient's heart rate was very fast around 145.  EKG consistent with a junctional tachycardia.  No evidence of acute myocardial infarction on the EKG.  Patient has past medical history significant for hypertension paroxysmal atrial fibrillation with an ablation many years ago.  Patient states she had a cardiac cath 2 years ago.  History of hyperlipidemia diabetes obesity and stage III chronic kidney disease and history of bipolar disorder.  Chart review shows that patient was last seen by cardiology part of the [facility] group on September 16, 2021.  By Dr. Geraldo Pitter.  They continued her medication which is flecainide 50 mg twice daily she is on Eliquis and she is on Toprol XL and she is on Imdur.  Patient states she did not take any of these medicines this morning because her heart rate according to her watch was in the 150s.  That will happen at times but usually is brief this time it persisted that is why she came in.  The last time it got bad like this was 6 months ago when she was in [place] when she passed out.  She was admitted to the hospital.  Sounds as if she was not cardioverted may have been treated with medications.      Home Medications Prior to Admission medications   Medication Sig Start Date End Date Taking? Authorizing Provider  albuterol (VENTOLIN HFA) 108 (90 Base) MCG/ACT  inhaler Inhale 2 puffs into the lungs every 4 (four) hours as needed for wheezing. 06/23/20   Wendling, Crosby Oyster, DO  apixaban (ELIQUIS) 5 MG TABS tablet Take 1 tablet (5 mg total) by mouth 2 (two) times daily. 09/16/21   Revankar, Reita Cliche, MD  atorvastatin (LIPITOR) 80 MG tablet Take 1 tablet (80 mg total) by mouth daily. 04/23/20   [name], DO  blood glucose meter kit and supplies One Touch Ultra  Check blood sugars once daily Dx: E11.9 10/01/21   [name], DO  buPROPion (WELLBUTRIN XL) 300 MG 24 hr tablet Take 1 tablet (300 mg total) by mouth daily. 10/20/21   [name], DO  EPINEPHrine 0.3 mg/0.3 mL IJ SOAJ injection Inject 0.3 mg into the muscle as needed for anaphylaxis. 11/17/20   [name], DO  fenofibrate 160 MG tablet Take 1 tablet (160 mg total) by mouth daily. 09/22/21   Revankar, Reita Cliche, MD  flecainide (TAMBOCOR) 50 MG tablet Take 1 tablet (50 mg total) by mouth 2 (two) times daily. 09/22/21   Revankar, Reita Cliche, MD  Galcanezumab-gnlm (EMGALITY) 120 MG/ML SOAJ Inject 120 mg into the skin every 30 (thirty) days.    [provider]  glucose blood (ONETOUCH VERIO) test strip Use daily to check blood sugar. 10/14/21   [name], DO  isosorbide mononitrate (IMDUR) 30 MG  24 hr tablet Take 0.5 tablets (15 mg total) by mouth daily. 09/22/21   Camnitz, Ocie Doyne, MD  levocetirizine (XYZAL) 5 MG tablet Take 1 tablet (5 mg total) by mouth every evening. 10/14/21   [name], DO  Lurasidone HCl 120 MG TABS Take 1 tablet (120 mg total) by mouth at bedtime. 10/20/21   [name], DO  metFORMIN (GLUCOPHAGE) 1000 MG tablet Take 1 tablet (1,000 mg total) by mouth 2 (two) times daily with a meal. 09/22/21   Wendling, Crosby Oyster, DO  metoprolol succinate (TOPROL-XL) 50 MG 24 hr tablet Take 1 tablet (50 mg total) by mouth daily. Take with or immediately following a meal. 10/19/21   Revankar, Reita Cliche, MD   nitroGLYCERIN (NITROSTAT) 0.4 MG SL tablet Place 1 tablet (0.4 mg total) under the tongue every 5 (five) minutes as needed for chest pain. 09/16/21   Revankar, Reita Cliche, MD  ondansetron (ZOFRAN-ODT) 4 MG disintegrating tablet Take 1 tablet (4 mg total) by mouth every 8 (eight) hours as needed for nausea. 10/20/21   [name], DO  pantoprazole (PROTONIX) 40 MG tablet Take 1 tablet (40 mg total) by mouth 2 (two) times daily before a meal. 10/20/21   Wendling, Crosby Oyster, DO  QUEtiapine (SEROQUEL) 300 MG tablet Take 1 tablet (300 mg total) by mouth at bedtime. 10/20/21   [name], DO  tiZANidine (ZANAFLEX) 4 MG tablet Take 1 tablet (4 mg total) by mouth every 6 (six) hours as needed for muscle spasms. 10/02/21   Colon Branch, MD  topiramate (TOPAMAX) 50 MG tablet Take 3 tablets (150 mg total) by mouth 2 (two) times daily. 09/22/21   Wendling, Crosby Oyster, DO  TRULICITY 1.5 GY/1.7CB SOPN Inject 1.5 mg into the skin as directed once a week. 09/22/21   [name], DO  Ubrogepant 100 MG TABS Take 100 mg by mouth as needed for migraine.    [provider]      Allergies    Bee venom, Onion, Tetanus-diphtheria toxoids td, Sumatriptan, Asa [aspirin], Nsaids, Iodine, Sulfa antibiotics, and Vancomycin    Review of Systems   Review of Systems  Constitutional:  Negative for chills and fever.  HENT:  Negative for ear pain and sore throat.   Eyes:  Negative for pain and visual disturbance.  Respiratory:  Negative for cough and shortness of breath.   Cardiovascular:  Positive for chest pain and palpitations.  Gastrointestinal:  Negative for abdominal pain and vomiting.  Genitourinary:  Negative for dysuria and hematuria.  Musculoskeletal:  Negative for arthralgias and back pain.  Skin:  Negative for color change and rash.  Neurological:  Positive for light-headedness. Negative for seizures and syncope.  All other systems reviewed and are negative.  Physical  Exam Updated Vital Signs BP (!) 89/72    Pulse (!) 145    Temp (!) 97.5 F (36.4 C) (Oral)    Resp 17    Ht 1.676 m ('5\' 6"' )    Wt 97.3 kg    SpO2 98%    BMI 34.64 kg/m  Physical Exam Vitals and nursing note reviewed.  Constitutional:      General: She is in acute distress.     Appearance: She is well-developed.  HENT:     Head: Normocephalic and atraumatic.     Mouth/Throat:     Mouth: Mucous membranes are moist.  Eyes:     Extraocular Movements: Extraocular movements intact.     Conjunctiva/sclera: Conjunctivae  normal.     Pupils: Pupils are equal, round, and reactive to light.  Cardiovascular:     Rate and Rhythm: Regular rhythm. Tachycardia present.     Heart sounds: No murmur heard. Pulmonary:     Effort: Pulmonary effort is normal. No respiratory distress.     Breath sounds: Normal breath sounds. No wheezing, rhonchi or rales.  Abdominal:     Palpations: Abdomen is soft.     Tenderness: There is no abdominal tenderness.  Musculoskeletal:        General: No swelling.     Cervical back: Neck supple.     Right lower leg: No edema.     Left lower leg: No edema.  Skin:    General: Skin is warm and dry.     Capillary Refill: Capillary refill takes less than 2 seconds.     Coloration: Skin is pale.  Neurological:     General: No focal deficit present.     Mental Status: She is alert and oriented to person, place, and time.  Psychiatric:        Mood and Affect: Mood normal.    ED Results / Procedures / Treatments   Labs (all labs ordered are listed, but only abnormal results are displayed) Labs Reviewed  RESP PANEL BY RT-PCR (FLU A&B, COVID) [id number]  COMPREHENSIVE METABOLIC PANEL  CBC WITH DIFFERENTIAL/PLATELET  TROPONIN I (HIGH SENSITIVITY)    EKG EKG Interpretation  Date/Time:  Thursday November 05 2021 12:08:08 EST Ventricular Rate:  98 PR Interval:  237 QRS Duration: 95 QT Interval:  349 QTC Calculation: 446 R Axis:   -33 Text Interpretation: Sinus rhythm  Prolonged PR interval Left axis deviation Borderline repolarization abnormality Baseline wander in lead(s) V2 Partial missing lead(s): V2 Prolonged PR but converted to sinus. Confirmed by Fredia Sorrow [phone]) on 11/05/2021 12:14:37 PM  Radiology No results found.  Procedures Procedures    Medications Ordered in ED Medications  sodium chloride 0.9 % bolus 1,000 mL (has no administration in time range)  0.9 %  sodium chloride infusion (has no administration in time range)  0.9 %  sodium chloride infusion (1,000 mLs Intravenous New Bag/Given 11/05/21 1205)  adenosine (ADENOCARD) 6 MG/2ML injection ( Intravenous Canceled Entry 11/05/21 1215)    ED Course/ Medical Decision Making/ A&P                           Medical Decision Making Amount and/or Complexity of Data Reviewed Labs: ordered. Radiology: ordered.  Risk Prescription drug management. Decision regarding hospitalization.   CRITICAL CARE Performed by: Fredia Sorrow Total critical care time: 60 minutes Critical care time was exclusive of separately billable procedures and treating other patients. Critical care was necessary to treat or prevent imminent or life-threatening deterioration. Critical care was time spent personally by me on the following activities: development of treatment plan with patient and/or surrogate as well as nursing, discussions with consultants, evaluation of patient's response to treatment, examination of patient, obtaining history from patient or surrogate, ordering and performing treatments and interventions, ordering and review of laboratory studies, ordering and review of radiographic studies, pulse oximetry and re-evaluation of patient's condition.  Patient with rapid heart rate around 150.  Patient also with some symptoms of feeling lightheaded.  Blood pressures at times would get low particular if she was sitting up.  Patient also with chest pain.  Considered for cardioversion since patient is on  Eliquis just missed this morning's dose  but not critical however the left like it could be more of an SVT on EKG and not atrial fibrillation.  So treated with adenosine which did convert her to a sinus rhythm with prolonged PR.  We will go ahead and dose patient with her Eliquis her flecainide and her Toprol-XL which she did not take this morning.  Upon converting chest pain improved but it did not completely resolve.  We will hold off on nitroglycerin since pressures were a little soft.  But after being chemically converted with adenosine blood pressure was around 438 systolic.  Patient most likely will require admission and because of all the symptoms surrounding it and the persistent chest pain.  Patient's labs reassuring COVID influenza negative complete metabolic panel CO2 17 glucose 201 on creatinine 1.78 for a GFR 31 but not too far off from baseline for her.  CBC normal white count 8.8 hemoglobin 12.5.  Initial troponin was 5.  Delta troponin pending.  Chest x-ray no active disease.  Because of the persistent chest pain will discuss with cardiology.  Feel that patient probably warrants admission but we will see what they say.  They may base it off the delta troponin.  With cardiology.  They are recommending hospitalist admission.  Patient's chest pain is improved but not gone was 4 out of 10.  Most recent blood pressure was 98/72.  We will discussed with hospitalist service     Final Clinical Impression(s) / ED Diagnoses Final diagnoses:  SVT (supraventricular tachycardia) ([place])  Precordial pain    Rx / DC Orders ED Discharge Orders     None         Fredia Sorrow, MD 11/05/21 1232    Fredia Sorrow, MD 11/05/21 1337    Fredia Sorrow, MD 11/05/21 1406

## 2021-11-05 NOTE — ED Triage Notes (Signed)
Pt arrives with reports of CP and her watch recording HR in 150s. Pt states her CP woke her up around 0830 this morning. States she ignored it at first because it can be common for her.  ? ?ED MD Zackowski at bedside.  ?

## 2021-11-05 NOTE — Assessment & Plan Note (Signed)
-   This is chronic. ?- We will continue her Topamax for now. ?

## 2021-11-05 NOTE — Assessment & Plan Note (Signed)
-   We will continue Topamax and Wellbutrin XL as well as Seroquel. ?

## 2021-11-05 NOTE — Progress Notes (Signed)
Plan of Care Note for accepted transfer ? ? ?Patient: Paula Stout MRN: [id number]   DOA: 11/05/2021 ? ?Facility requesting transfer: [facility] ?Requesting Provider: Dr. Rogene Houston ?Reason for transfer: chest pain  ?Facility course: 66 y.o with hx of asthma, afib, bipolar, anxiety, HLD, T2DM, seizures, migraines, HTN, Ckd stage 3, presenting to Ed with chest pain. She woke up this morning with chest pain at 8:30 and heart rate at 150. In ED heart rate was 148 and was given 6mg  of adenosine and was converted back to NSR. Chest pain better, but still elevated to 4/10 and bp soft systolic 17-356. Troponin 5> delta pending. Cardiology recommending admit and they will be available if needed.  ? ?Vitals stable besides soft pressure at times. 105-121/67-76, HR: 74, RR: 19, oxygen 100%RA ?Labs stable. Heart score not calculated by edp.  ? ?Fyi:  ?She presented to [facility] June 2021 with palpitations and chest pain.  She was found to be in sinus rhythm on presentation.  Catheterization showed no evidence of coronary artery disease. ? ?Plan of care: ?The patient is accepted for admission to Telemetry unit, at [facility]..  ?Consult cards.  ? ?Author: ?Orma Flaming, MD ?11/05/2021 ? ?Check [email or web address] for on-call coverage. ? ?Nursing staff, Please call [place] number on Amion as soon as patient's arrival, so appropriate admitting provider can evaluate the pt. ?

## 2021-11-05 NOTE — Assessment & Plan Note (Signed)
She will be placed on supplement coverage with NovoLog and will continue basal coverage. ?

## 2021-11-05 NOTE — Assessment & Plan Note (Signed)
-   The patient was directly admitted to an observation telemetry bed. ?- We will follow serial troponins especially given increased troponin delta.. ?- We will monitor for recurrent arrhythmias. ?- We will continue current antiarrhythmics including some core as well as continue Toprol-XL. ?- Cardiology consult will be obtained. ?- [place] cardiology was notified about the patient. ?

## 2021-11-05 NOTE — H&P (Signed)
[place]   PATIENT NAME: Paula Stout    MR#:  [id number]  DATE OF BIRTH:  Jul 13, 1956  DATE OF ADMISSION:  11/05/2021  PRIMARY CARE PHYSICIAN: [name], DO   Patient is coming from: Home  REQUESTING/REFERRING PHYSICIAN: [facility]: Fredia Sorrow, MD   CHIEF COMPLAINT:   Chief Complaint  Patient presents with   Chest Pain    HISTORY OF PRESENT ILLNESS:  Paula Stout is a 66 y.o. female with medical history significant for paroxysmal atrial fibrillation, s/p cardiac ablation many years ago, asthma, hypertension bipolar 1 disorder, migraine, type II diabetes mellitus, seizure disorder, generalized anxiety disorder, dyslipidemia and IBS, presented to emergency room with acute onset of 10/10 substernal chest pain felt as pressure which woke her up this morning around 8:30 AM with palpitations, lightheadedness and associated nausea without vomiting with no radiation.  She stated that her heart rate per her watch was in the 150s and therefore she did not take any of her medications this morning.  She was given 6 mg of IV adenosine at [facility] with conversion to normal sinus rhythm.  She was seen by cardiology with [facility] on 09/16/2018 by Dr. Geraldo Pitter and her flecainide 50 mg p.o. twice daily was continued as well as Eliquis and Toprol-XL in February.  She was slight hypotensive when she presented to [facility] ED with a BP of 89/72 and when she was brought to room for systolic BP was in the 408X.  She was noted to be tachycardic with heart rate of 145 and EKG showed junctional tachycardia with a rate of 148 and left axis deviation repolarization abnormality.  She did not take any oral medications this morning.  She does get occasional tachycardia but will be billed.  This time it was persistent and therefore she presented to the ED.  She has a history of syncope 6 months ago with similar tachycardia that was treated with medications then.  She denies any leg pain or edema or recent  travels or surgeries.  She denies any fever or chills.  No nausea or vomiting or abdominal pain.  No cough or wheezing or hemoptysis.  No dysuria, oliguria or hematuria or flank pain.[name]  She was given her Eliquis, fentanyl 50 mcg IV, Tambocor 50 mg p.o., Toprol-XL 50 mg p.o. and Lopressor 5 mg IV as well as 1 L bolus of IV normal saline.  ED Course: On presentation ER vital signs were within normal heart rate of 71.  Labs revealed a glucose of 201, CO2 of 17 with a creatinine of 1.78 compared to 1.66 on 10/15/2021 and 1.91 on 10/02/2021.  Albumin was 3.4.  High-sensitivity troponin was 525.  CBC was within normal. EKG as reviewed by me : EKG reveals sinus rhythm with prolonged PR interval with a rate of 98 and left axis deviation repolarization abnormality.  She had no EKG 5 hours later that revealed junctional tachycardia again with rate 133 with left axis deviation.  Distal arm converted to normal sinus rhythm with rate of 77 with left axis deviation and prolonged QT interval with QTc of 646 MS.  Imaging: Portable chest ray showed no acute pulmonary disease.  [place] cardiology was contacted and recommendation was for admission for further monitoring.  She is admitted to an observation telemetry bed for further evaluation and management. PAST MEDICAL HISTORY:   Past Medical History:  Diagnosis Date   A-fib ([place])    Abscess 02/03/2019   Acute cystitis without hematuria 05/14/2017  Last Assessment & Plan:  Formatting of this note might be different from the original. - suprapubic tenderness with symptoms of urinary frequency and urgency and subjective fevers (no fevers recorded on any visit). Recently passed a renal stone, US shows left hydronephrosis which is improving.  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 A:  Chronic.  Sinus rhythm at this time.  States she was told this during admission at [facility] in the past. P:  On baby aspirin at home will continue.  Low dose metoprolol started.   Bipolar 1 disorder ([place]) 10/11/2013   Bipolar 1 disorder, depressed, moderate ([place]) 02/18/2020   Bipolar disorder ([place])    Cellulitis 02/03/2019   Chest pain 02/19/2020   Chronic anticoagulation 02/20/2020   Chronic atrial fibrillation ([place]) 02/18/2020   Chronic headache 10/11/2013   Chronic migraine without aura, intractable, without status migrainosus 06/24/2017   Chronic pain of both shoulders 11/14/2015   Colon cancer screening 04/15/2016   Last Assessment & Plan:  Formatting of this note might be different from the original. Will schedule for colonoscopy   Depressed bipolar I disorder ([place]) 02/06/2021   Depression    Depression, recurrent ([place]) 02/18/2020   Diabetes mellitus ([place])    Diabetes mellitus due to underlying condition with unspecified complications ([place]) 05/13/5882   Diabetes mellitus type 2 in obese ([place]) 10/11/2013   Last Assessment & Plan:  Formatting of this note might be different from the original. A:  Diabetes Mellitus Type 2 without long term use of insulin P:  Hold Home metformin. Reg ISS.   Diabetes mellitus without complication ([place])    Dyslipidemia 02/20/2020   Epilepsy ([place]) 02/06/2021   GAD (generalized anxiety disorder) 02/18/2020   Heartburn 04/15/2016   Last Assessment & Plan:  Formatting of this note might be different from the original. Patient was counseled regarding lifestyle modification  Advised to take Prilosec 30 mins before breakfast  Will schedule for EGD   History of atrial fibrillation 06/24/2017   History of posttraumatic stress disorder (PTSD) 10/07/2016   HLD (hyperlipidemia) 09/01/2014   Last Assessment & Plan:  Formatting of this note  might be different from the original. A: Chronic. P: Continue statin.   Hyperglycemia due to type 2 diabetes mellitus ([place]) 02/06/2021   Hyperosmolarity due to secondary diabetes mellitus ([place]) 02/06/2021   Hypertriglyceridemia 02/18/2020   Impaired mobility and activities of daily living 11/14/2015   Insomnia 02/06/2021   Irritable bowel syndrome (IBS)    Kidney stone 02/06/2021   Left elbow pain 11/14/2015   Migraine without aura, not refractory 02/06/2021   Migraines 10/11/2013   Last Assessment & Plan:  Formatting of this note might be different from the original. A: Chronic. P: Continue Topamax. (also takes it for bipolar)   Morbid obesity ([place]) 12/16/2016   Multiple sclerosis ([place]) 06/24/2017   Obesity (BMI 30-39.9) 09/01/2014   Last Assessment & Plan:  Formatting of this note might be different from the original. A: Morbid obesity, with BMI of 35.0 - 39.9 P: Counseled to loose wt.   Paroxysmal atrial fibrillation ([place]) 04/14/2020   Polyneuropathy  due to type 2 diabetes mellitus ([place]) 02/06/2021   Refractory migraine with aura 02/06/2021   Refractory migraine without aura 09/11/1094   Renal colic on left side 0/45/4098   Seizure disorder ([place]) 06/24/2017   Sleep apnea 02/06/2021   Sleep-disordered breathing 06/05/2018   Stage 3 chronic kidney disease ([place]) 02/20/2020   Unstable angina ([place]) 09/01/2014   Last Assessment & Plan:  Formatting of this note might be different from the original. A: Pt has 2 months exertional angina, recently developed angina at rest intermittently.  She had a positive nuclear medicine cardiolyte stress test with anteroapical ischemia on stress portion not seen at rest. P:  - continue aspirin, statin, metoprolol - Discussed stress test findings with cardiologist Dr. Landis Gandy   Urinary tract obstruction 02/06/2021    PAST SURGICAL HISTORY:   Past Surgical History:  Procedure Laterality Date   ABDOMINAL HYSTERECTOMY     Fibroids   ATRIAL FIBRILLATION ABLATION     CARDIAC  CATHETERIZATION     CESAREAN SECTION     KNEE SURGERY     LEFT HEART CATH AND CORONARY ANGIOGRAPHY N/A 03/04/2020   Procedure: LEFT HEART CATH AND CORONARY ANGIOGRAPHY;  Surgeon: Belva Crome, MD;  Location: [place] [facility];  Service: Cardiovascular;  Laterality: N/A;   ROTATOR CUFF REPAIR      SOCIAL HISTORY:   Social History   Tobacco Use   Smoking status: Never   Smokeless tobacco: Never  Substance Use Topics   Alcohol use: Never    FAMILY HISTORY:   Family History  Adopted: Yes  Problem Relation Age of Onset   Bipolar disorder Son    Bipolar disorder Daughter     DRUG ALLERGIES:   Allergies  Allergen Reactions   Bee Venom Anaphylaxis   Onion     Other reaction(s): Respiratory Distress   Tetanus-Diphtheria Toxoids Td Anaphylaxis   Sumatriptan     Passed out, nose bleed    Asa [Aspirin] Nausea And Vomiting   Nsaids Nausea And Vomiting   Iodine Rash   Sulfa Antibiotics Rash   Vancomycin Rash    REVIEW OF SYSTEMS:   ROS As per history of present illness. All pertinent systems were reviewed above. Constitutional, HEENT, cardiovascular, respiratory, GI, GU, musculoskeletal, neuro, psychiatric, endocrine, integumentary and hematologic systems were reviewed and are otherwise negative/unremarkable except for positive findings mentioned above in the HPI.   MEDICATIONS AT HOME:   Prior to Admission medications   Medication Sig Start Date End Date Taking? Authorizing Provider  albuterol (VENTOLIN HFA) 108 (90 Base) MCG/ACT inhaler Inhale 2 puffs into the lungs every 4 (four) hours as needed for wheezing. 06/23/20   Wendling, Crosby Oyster, DO  apixaban (ELIQUIS) 5 MG TABS tablet Take 1 tablet (5 mg total) by mouth 2 (two) times daily. 09/16/21   Revankar, Reita Cliche, MD  atorvastatin (LIPITOR) 80 MG tablet Take 1 tablet (80 mg total) by mouth daily. 04/23/20   [name], DO  blood glucose meter kit and supplies One Touch Ultra  Check blood sugars once  daily Dx: E11.9 10/01/21   [name], DO  buPROPion (WELLBUTRIN XL) 300 MG 24 hr tablet Take 1 tablet (300 mg total) by mouth daily. 10/20/21   [name], DO  EPINEPHrine 0.3 mg/0.3 mL IJ SOAJ injection Inject 0.3 mg into the muscle as needed for anaphylaxis. 11/17/20   [name], DO  fenofibrate 160 MG tablet Take 1 tablet (160 mg total) by mouth daily.  09/22/21   Revankar, Reita Cliche, MD  flecainide (TAMBOCOR) 50 MG tablet Take 1 tablet (50 mg total) by mouth 2 (two) times daily. 09/22/21   Revankar, Reita Cliche, MD  Galcanezumab-gnlm (EMGALITY) 120 MG/ML SOAJ Inject 120 mg into the skin every 30 (thirty) days.    [provider]  glucose blood (ONETOUCH VERIO) test strip Use daily to check blood sugar. 10/14/21   [name], DO  isosorbide mononitrate (IMDUR) 30 MG 24 hr tablet Take 0.5 tablets (15 mg total) by mouth daily. 09/22/21   Camnitz, Ocie Doyne, MD  levocetirizine (XYZAL) 5 MG tablet Take 1 tablet (5 mg total) by mouth every evening. 10/14/21   [name], DO  Lurasidone HCl 120 MG TABS Take 1 tablet (120 mg total) by mouth at bedtime. 10/20/21   [name], DO  metFORMIN (GLUCOPHAGE) 1000 MG tablet Take 1 tablet (1,000 mg total) by mouth 2 (two) times daily with a meal. 09/22/21   Wendling, Crosby Oyster, DO  metoprolol succinate (TOPROL-XL) 50 MG 24 hr tablet Take 1 tablet (50 mg total) by mouth daily. Take with or immediately following a meal. 10/19/21   Revankar, Reita Cliche, MD  nitroGLYCERIN (NITROSTAT) 0.4 MG SL tablet Place 1 tablet (0.4 mg total) under the tongue every 5 (five) minutes as needed for chest pain. 09/16/21   Revankar, Reita Cliche, MD  ondansetron (ZOFRAN-ODT) 4 MG disintegrating tablet Take 1 tablet (4 mg total) by mouth every 8 (eight) hours as needed for nausea. 10/20/21   [name], DO  pantoprazole (PROTONIX) 40 MG tablet Take 1 tablet (40 mg total) by mouth 2 (two) times daily before a  meal. 10/20/21   Wendling, Crosby Oyster, DO  QUEtiapine (SEROQUEL) 300 MG tablet Take 1 tablet (300 mg total) by mouth at bedtime. 10/20/21   [name], DO  tiZANidine (ZANAFLEX) 4 MG tablet Take 1 tablet (4 mg total) by mouth every 6 (six) hours as needed for muscle spasms. 10/02/21   Colon Branch, MD  topiramate (TOPAMAX) 50 MG tablet Take 3 tablets (150 mg total) by mouth 2 (two) times daily. 09/22/21   Wendling, Crosby Oyster, DO  TRULICITY 1.5 DT/2.6ZT SOPN Inject 1.5 mg into the skin as directed once a week. 09/22/21   [name], DO  Ubrogepant 100 MG TABS Take 100 mg by mouth as needed for migraine.    [provider]      VITAL SIGNS:  Blood pressure 125/68, pulse 71, temperature 97.6 F (36.4 C), temperature source Oral, resp. rate 17, height 5' 6" (1.676 m), weight 97.3 kg, SpO2 100 %.  PHYSICAL EXAMINATION:  Physical Exam  GENERAL:  66 y.o.-year-old female patient lying in the bed with no acute distress.  EYES: Pupils equal, round, reactive to light and accommodation. No scleral icterus. Extraocular muscles intact.  HEENT: Head atraumatic, normocephalic. Oropharynx and nasopharynx clear.  NECK:  Supple, no jugular venous distention. No thyroid enlargement, no tenderness.  LUNGS: Normal breath sounds bilaterally, no wheezing, rales,rhonchi or crepitation. No use of accessory muscles of respiration.  CARDIOVASCULAR: Regular rate and rhythm, S1, S2 normal. No murmurs, rubs, or gallops.  ABDOMEN: Soft, nondistended, nontender. Bowel sounds present. No organomegaly or mass.  EXTREMITIES: No pedal edema, cyanosis, or clubbing.  NEUROLOGIC: Cranial nerves II through XII are intact. Muscle strength 5/5 in all extremities. Sensation intact. Gait not checked.  PSYCHIATRIC: The patient is alert and oriented x 3.  Normal affect and good eye contact.  SKIN: No obvious rash, lesion, or ulcer.   LABORATORY PANEL:   CBC Recent Labs  Lab 11/05/21 1155  WBC  8.8  HGB 12.5  HCT 38.8  PLT 363   ------------------------------------------------------------------------------------------------------------------  Chemistries  Recent Labs  Lab 11/05/21 1155  NA 140  K 4.1  CL 114*  CO2 17*  GLUCOSE 201*  BUN 20  CREATININE 1.78*  CALCIUM 9.2  AST 22  ALT 18  ALKPHOS 41  BILITOT 0.6   ------------------------------------------------------------------------------------------------------------------  Cardiac Enzymes No results for input(s): TROPONINI in the last 168 hours. ------------------------------------------------------------------------------------------------------------------  RADIOLOGY:  DG Chest Port 1 View  Result Date: 11/05/2021 CLINICAL DATA:  Chest pain. EXAM: PORTABLE CHEST 1 VIEW COMPARISON:  April 22, 2020. FINDINGS: The heart size and mediastinal contours are within normal limits. Both lungs are clear. The visualized skeletal structures are unremarkable. IMPRESSION: No active disease. Electronically Signed   By: Marijo Conception M.D.   On: 11/05/2021 12:36      IMPRESSION AND PLAN:  Assessment and Plan: * Chest pain - The patient was directly admitted to an observation telemetry bed. - We will follow serial troponins especially given increased troponin delta.. - We will monitor for recurrent arrhythmias. - We will continue current antiarrhythmics including some core as well as continue Toprol-XL. - Cardiology consult will be obtained. [facility] cardiology was notified about the patient.  Paroxysmal supraventricular tachycardia (HCC) - The patient heart rate per her report was initially in the 150s. - She did respond to IV adenosine initially and later on to 5 mg of IV Lopressor in addition to p.o. flecainide and Toprol-XL. - She will be monitored for any recurrent paroxysms.  Junctional tachycardia ([place]) - This has already converted to normal sinus rhythm after being given flecainide, Toprol-XL and albumin of  IV Lopressor in addition to hydration with IV normal saline..  Diabetes mellitus type 2 in obese [facility]) She will be placed on supplement coverage with NovoLog and will continue basal coverage.  Dyslipidemia - We will continue statin therapy as well as fenofibrate.  GERD (gastroesophageal reflux disease) - We will continue PPI therapy.  Paroxysmal atrial fibrillation (HCC) - We will continue her flecainide and Toprol-XL. - We will continue her Eliquis.  Bipolar 1 disorder, depressed, moderate (HCC) - We will continue Topamax and Wellbutrin XL as well as Seroquel.  Migraines - This is chronic. - We will continue her Topamax for now.       DVT prophylaxis: Eliquis Advanced Care Planning:  Code Status: full code. Family Communication:  The plan of care was discussed in details with the patient (and family). I answered all questions. The patient agreed to proceed with the above mentioned plan. Further management will depend upon hospital course. Disposition Plan: Back to previous home environment Consults called: Cardiology. All the records are reviewed and case discussed with ED provider.  Status is: Observation  I certify that at the time of admission, it is my clinical judgment that the patient will require  hospital care extending less than 2 midnights.                            Dispo: The patient is from: Home              Anticipated d/c is to: Home              Patient currently is not medically stable to d/c.  Difficult to place patient: No  Christel Mormon M.D on 11/05/2021 at 9:37 PM  [facility]   From 7 PM-7 AM, contact night-coverage [email or web address]  CC: Primary care physician; [name], DO

## 2021-11-05 NOTE — ED Notes (Signed)
Pt called out for chest pain.  Noted pt HR increased to 130's.  EKG performed,  Dr. Laverta Baltimore notifed ?

## 2021-11-05 NOTE — Assessment & Plan Note (Signed)
-   The patient heart rate per her report was initially in the 150s. ?- She did respond to IV adenosine initially and later on to 5 mg of IV Lopressor in addition to p.o. flecainide and Toprol-XL. ?- She will be monitored for any recurrent paroxysms. ?

## 2021-11-05 NOTE — ED Provider Notes (Signed)
Blood pressure 119/72, pulse 73, temperature (!) 97.5 ?F (36.4 ?C), temperature source Oral, resp. rate 15, height 5\' 6"  (1.676 m), weight 97.3 kg, SpO2 100 %. ? ? In short, Paula Stout is a 66 y.o. female with a chief complaint of Chest Pain ?[name]  Refer to the original H&P for additional details. ? ?05:30 PM  ?Patient is awaiting admit/transfer to the hospital after episode of tachycardia earlier today.  She began complaining of chest pain once again and heart rate was noted to be in a junctional tachycardia with a rate in the 130s.  Consider giving some additional adenosine but during my assessment the patient spontaneously converted back to sinus rhythm now with rate in the 70s.  Blood pressure never dropped below the 120s with this episode.  She was given her home dose of metoprolol which is extended release.  We will give IV metoprolol dose here in the emergency department.  ? ? EKG Interpretation ? ?Date/Time:  Thursday November 05 2021 17:29:14 EST ?Ventricular Rate:  77 ?PR Interval:  187 ?QRS Duration: 100 ?QT Interval:  570 ?QTC Calculation: 646 ?R Axis:   -31 ?Text Interpretation: Sinus rhythm Left axis deviation Borderline T abnormalities, diffuse leads Prolonged QT interval Confirmed by Nanda Quinton [id number]) on 11/05/2021 5:31:44 PM ?  ? ?  ? ? ? ?  ?Margette Fast, MD ?11/05/21 1732 ? ?

## 2021-11-05 NOTE — Assessment & Plan Note (Signed)
-   We will continue her flecainide and Toprol-XL. ?- We will continue her Eliquis. ?

## 2021-11-05 NOTE — Assessment & Plan Note (Signed)
-   We will continue PPI therapy 

## 2021-11-05 NOTE — Assessment & Plan Note (Signed)
-   We will continue statin therapy as well as fenofibrate. 

## 2021-11-06 ENCOUNTER — Other Ambulatory Visit: Payer: Self-pay | Admitting: Family Medicine

## 2021-11-06 DIAGNOSIS — K219 Gastro-esophageal reflux disease without esophagitis: Secondary | ICD-10-CM | POA: Diagnosis not present

## 2021-11-06 DIAGNOSIS — G43809 Other migraine, not intractable, without status migrainosus: Secondary | ICD-10-CM

## 2021-11-06 DIAGNOSIS — I480 Paroxysmal atrial fibrillation: Secondary | ICD-10-CM

## 2021-11-06 DIAGNOSIS — I471 Supraventricular tachycardia: Secondary | ICD-10-CM | POA: Diagnosis not present

## 2021-11-06 DIAGNOSIS — R079 Chest pain, unspecified: Secondary | ICD-10-CM | POA: Diagnosis not present

## 2021-11-06 LAB — BASIC METABOLIC PANEL
Anion gap: 7 (ref 5–15)
BUN: 13 mg/dL (ref 8–23)
CO2: 18 mmol/L — ABNORMAL LOW (ref 22–32)
Calcium: 8.3 mg/dL — ABNORMAL LOW (ref 8.9–10.3)
Chloride: 117 mmol/L — ABNORMAL HIGH (ref 98–111)
Creatinine, Ser: 1.41 mg/dL — ABNORMAL HIGH (ref 0.44–1.00)
GFR, Estimated: 42 mL/min — ABNORMAL LOW (ref >60)
Glucose, Bld: 116 mg/dL — ABNORMAL HIGH (ref 70–99)
Potassium: 4.1 mmol/L (ref 3.5–5.1)
Sodium: 142 mmol/L (ref 135–145)

## 2021-11-06 LAB — LIPID PANEL
Cholesterol: 93 mg/dL (ref 0–200)
HDL: 21 mg/dL — ABNORMAL LOW (ref >40)
LDL Cholesterol: 48 mg/dL (ref 0–99)
Total CHOL/HDL Ratio: 4.4 RATIO
Triglycerides: 119 mg/dL (ref <150)
VLDL: 24 mg/dL (ref 0–40)

## 2021-11-06 LAB — TROPONIN I (HIGH SENSITIVITY): Troponin I (High Sensitivity): 73 ng/L — ABNORMAL HIGH (ref <18)

## 2021-11-06 LAB — HIV ANTIBODY (ROUTINE TESTING W REFLEX): HIV Screen 4th Generation wRfx: NONREACTIVE

## 2021-11-06 MED ORDER — MAGNESIUM SULFATE 2 GM/50ML IV SOLN
2.0000 g | Freq: Once | INTRAVENOUS | Status: AC
  Administered 2021-11-06: 2 g via INTRAVENOUS
  Filled 2021-11-06: qty 50

## 2021-11-06 NOTE — Progress Notes (Signed)
Went over discharge instructions with patient at the bedside. No medications changes noted. Patient educated on low sodium heart healthy diet. Patient made aware of follow up cardiology appointments and PCP appointments. Patient verbalize understanding. NT removed PIV. Tele monitor removed and CCMD notified. Transportation called and en route.  ?

## 2021-11-06 NOTE — Progress Notes (Signed)
Brief cardiology follow up note: ?No further SVT. Has some mild intermittent chest tightness but nothing like yesterday.  ? ?Agree with plan for discharge, follows with Dr. Geraldo Pitter at [facility], will request follow up visit. ? ?Buford Dresser, MD, PhD, [facility] ?[place]  ?[place]  [facility] ?[address], [address] ?[place], [place] [address] ?[phone]  ?

## 2021-11-06 NOTE — Consult Note (Signed)
Cardiology Consultation:   Patient ID: Paula Stout MRN: [id number]; DOB: 06/01/1956  Admit date: 11/05/2021 Date of Consult: 11/06/2021  Primary Care Provider: [name], DO Primary Cardiologist: None  Primary Electrophysiologist:  [name], MD    Patient Profile:   Paula Stout is a 66 y.o. female with a hx of paroxysmal atrial fibrillation, hypertension, asthma, bipolar disorder, type 2 diabetes mellitus, hyperlipidemia who is being seen today for the evaluation of chest pain and tachycardia at the request of medicine team.  History of Present Illness:   Paula Stout is a 66 year old female with a history of paroxysmal atrial fibrillation, hypertension, type 2 diabetes, hyperlipidemia, bipolar disorder who presented initially to outside emergency department with chest pain palpitations and notably had a heart rate that was in the 150s.  There Paula Stout was given IV adenosine with reversion to normal sinus rhythm.  At home Paula Stout takes metoprolol, flecainide, and apixaban for anticoagulation.  Of note, Paula Stout has had catheter ablation of her atrial fibrillation in the past.  Since return to normal sinus rhythm, Paula Stout has not had any ongoing symptoms.  Resting comfortably in bed when I saw her at [facility].  Not having any chest pain.  Past Medical History:  Diagnosis Date   A-fib [facility])    Abscess 02/03/2019   Acute cystitis without hematuria 05/14/2017   Last Assessment & Plan:  Formatting of this note might be different from the original. - suprapubic tenderness with symptoms of urinary frequency and urgency and subjective fevers (no fevers recorded on any visit). Recently passed a renal stone, US shows left hydronephrosis which is improving.  - UA negative8/24 but pt is clinically symptomatic - will repeat UA  - start pt on Microbid for 5 days   Adhesive capsulitis of left shoulder 12/16/2016   Anxiety    Asthma    Asthma without status asthmaticus 02/06/2021   At  risk for falls 02/04/2019   Atrial fibrillation ([place]) 09/01/2014   Last Assessment & Plan:  Formatting of this note might be different from the original. A:  Chronic.  Sinus rhythm at this time.  States Paula Stout was told this during admission at [facility] in the past. P:  On baby aspirin at home will continue.  Low dose metoprolol started.   Bipolar 1 disorder ([place]) 10/11/2013   Bipolar 1 disorder, depressed, moderate ([place]) 02/18/2020   Bipolar disorder ([place])    Cellulitis 02/03/2019   Chest pain 02/19/2020   Chronic anticoagulation 02/20/2020   Chronic atrial fibrillation ([place]) 02/18/2020   Chronic headache 10/11/2013   Chronic migraine without aura, intractable, without status migrainosus 06/24/2017   Chronic pain of both shoulders 11/14/2015   Colon cancer screening 04/15/2016   Last Assessment & Plan:  Formatting of this note might be different from the original. Will schedule for colonoscopy   Depressed bipolar I disorder ([place]) 02/06/2021   Depression    Depression, recurrent ([place]) 02/18/2020   Diabetes mellitus ([place])    Diabetes mellitus due to underlying condition with unspecified complications ([place]) 0/10/7739   Diabetes mellitus type 2 in obese ([place]) 10/11/2013   Last Assessment & Plan:  Formatting of this note might be different from the original. A:  Diabetes Mellitus Type 2 without long term use of insulin P:  Hold Home metformin. Reg ISS.   Diabetes mellitus without complication ([place])    Dyslipidemia 02/20/2020   Epilepsy ([place]) 02/06/2021   GAD (generalized anxiety disorder) 02/18/2020   Heartburn 04/15/2016  Last Assessment & Plan:  Formatting of this note might be different from the original. Patient was counseled regarding lifestyle modification  Advised to take Prilosec 30 mins before breakfast  Will schedule for EGD   History of atrial fibrillation 06/24/2017   History of posttraumatic stress disorder (PTSD) 10/07/2016   HLD (hyperlipidemia) 09/01/2014   Last Assessment & Plan:  Formatting of this  note might be different from the original. A: Chronic. P: Continue statin.   Hyperglycemia due to type 2 diabetes mellitus ([place]) 02/06/2021   Hyperosmolarity due to secondary diabetes mellitus ([place]) 02/06/2021   Hypertriglyceridemia 02/18/2020   Impaired mobility and activities of daily living 11/14/2015   Insomnia 02/06/2021   Irritable bowel syndrome (IBS)    Kidney stone 02/06/2021   Left elbow pain 11/14/2015   Migraine without aura, not refractory 02/06/2021   Migraines 10/11/2013   Last Assessment & Plan:  Formatting of this note might be different from the original. A: Chronic. P: Continue Topamax. (also takes it for bipolar)   Morbid obesity ([place]) 12/16/2016   Multiple sclerosis ([place]) 06/24/2017   Obesity (BMI 30-39.9) 09/01/2014   Last Assessment & Plan:  Formatting of this note might be different from the original. A: Morbid obesity, with BMI of 35.0 - 39.9 P: Counseled to loose wt.   Paroxysmal atrial fibrillation ([facility]) 04/14/2020   Polyneuropathy due to type 2 diabetes mellitus ([place]) 02/06/2021   Refractory migraine with aura 02/06/2021   Refractory migraine without aura 03/07/5365   Renal colic on left side 4/40/3474   Seizure disorder ([place]) 06/24/2017   Sleep apnea 02/06/2021   Sleep-disordered breathing 06/05/2018   Stage 3 chronic kidney disease ([place]) 02/20/2020   Unstable angina ([place]) 09/01/2014   Last Assessment & Plan:  Formatting of this note might be different from the original. A: Pt has 2 months exertional angina, recently developed angina at rest intermittently.  Paula Stout had a positive nuclear medicine cardiolyte stress test with anteroapical ischemia on stress portion not seen at rest. P:  - continue aspirin, statin, metoprolol - Discussed stress test findings with cardiologist Dr. Landis Gandy   Urinary tract obstruction 02/06/2021    Past Surgical History:  Procedure Laterality Date   ABDOMINAL HYSTERECTOMY     Fibroids   ATRIAL FIBRILLATION ABLATION     CARDIAC CATHETERIZATION     CESAREAN  SECTION     KNEE SURGERY     LEFT HEART CATH AND CORONARY ANGIOGRAPHY N/A 03/04/2020   Procedure: LEFT HEART CATH AND CORONARY ANGIOGRAPHY;  Surgeon: Belva Crome, MD;  Location: [place] [facility];  Service: Cardiovascular;  Laterality: N/A;   ROTATOR CUFF REPAIR       Home Medications:  Prior to Admission medications   Medication Sig Start Date End Date Taking? Authorizing Provider  albuterol (VENTOLIN HFA) 108 (90 Base) MCG/ACT inhaler Inhale 2 puffs into the lungs every 4 (four) hours as needed for wheezing. 06/23/20   Wendling, Crosby Oyster, DO  apixaban (ELIQUIS) 5 MG TABS tablet Take 1 tablet (5 mg total) by mouth 2 (two) times daily. 09/16/21   Revankar, Reita Cliche, MD  atorvastatin (LIPITOR) 80 MG tablet Take 1 tablet (80 mg total) by mouth daily. 04/23/20   [name], DO  blood glucose meter kit and supplies One Touch Ultra  Check blood sugars once daily Dx: E11.9 10/01/21   [name], DO  buPROPion (WELLBUTRIN XL) 300 MG 24 hr tablet Take 1 tablet (300 mg total) by mouth daily. 10/20/21  [name], DO  EPINEPHrine 0.3 mg/0.3 mL IJ SOAJ injection Inject 0.3 mg into the muscle as needed for anaphylaxis. 11/17/20   [name], DO  fenofibrate 160 MG tablet Take 1 tablet (160 mg total) by mouth daily. 09/22/21   Revankar, Reita Cliche, MD  flecainide (TAMBOCOR) 50 MG tablet Take 1 tablet (50 mg total) by mouth 2 (two) times daily. 09/22/21   Revankar, Reita Cliche, MD  Galcanezumab-gnlm (EMGALITY) 120 MG/ML SOAJ Inject 120 mg into the skin every 30 (thirty) days.    [provider]  glucose blood (ONETOUCH VERIO) test strip Use daily to check blood sugar. 10/14/21   [name], DO  isosorbide mononitrate (IMDUR) 30 MG 24 hr tablet Take 0.5 tablets (15 mg total) by mouth daily. 09/22/21   Camnitz, Ocie Doyne, MD  levocetirizine (XYZAL) 5 MG tablet Take 1 tablet (5 mg total) by mouth every evening. 10/14/21   [name],  DO  Lurasidone HCl 120 MG TABS Take 1 tablet (120 mg total) by mouth at bedtime. 10/20/21   [name], DO  metFORMIN (GLUCOPHAGE) 1000 MG tablet Take 1 tablet (1,000 mg total) by mouth 2 (two) times daily with a meal. 09/22/21   Wendling, Crosby Oyster, DO  metoprolol succinate (TOPROL-XL) 50 MG 24 hr tablet Take 1 tablet (50 mg total) by mouth daily. Take with or immediately following a meal. 10/19/21   Revankar, Reita Cliche, MD  nitroGLYCERIN (NITROSTAT) 0.4 MG SL tablet Place 1 tablet (0.4 mg total) under the tongue every 5 (five) minutes as needed for chest pain. 09/16/21   Revankar, Reita Cliche, MD  ondansetron (ZOFRAN-ODT) 4 MG disintegrating tablet Take 1 tablet (4 mg total) by mouth every 8 (eight) hours as needed for nausea. 10/20/21   [name], DO  pantoprazole (PROTONIX) 40 MG tablet Take 1 tablet (40 mg total) by mouth 2 (two) times daily before a meal. 10/20/21   Wendling, Crosby Oyster, DO  QUEtiapine (SEROQUEL) 300 MG tablet Take 1 tablet (300 mg total) by mouth at bedtime. 10/20/21   [name], DO  tiZANidine (ZANAFLEX) 4 MG tablet Take 1 tablet (4 mg total) by mouth every 6 (six) hours as needed for muscle spasms. 10/02/21   Colon Branch, MD  topiramate (TOPAMAX) 50 MG tablet Take 3 tablets (150 mg total) by mouth 2 (two) times daily. 09/22/21   Wendling, Crosby Oyster, DO  TRULICITY 1.5 UU/8.2CM SOPN Inject 1.5 mg into the skin as directed once a week. 09/22/21   [name], DO  Ubrogepant 100 MG TABS Take 100 mg by mouth as needed for migraine.    [provider]    Inpatient Medications: Scheduled Meds:  apixaban  5 mg Oral BID   atorvastatin  80 mg Oral Daily   buPROPion  300 mg Oral Daily   fenofibrate  160 mg Oral Daily   flecainide  50 mg Oral BID   isosorbide mononitrate  15 mg Oral Daily   lidocaine  15 mL Oral Once   loratadine  10 mg Oral Daily   lurasidone  120 mg Oral QHS   metoprolol succinate  50 mg Oral Daily    pantoprazole  40 mg Oral BID AC   QUEtiapine  300 mg Oral QHS   topiramate  150 mg Oral BID   Continuous Infusions:  sodium chloride 100 mL/hr at 11/05/21 1355   sodium chloride     PRN Meds: acetaminophen, albuterol, ALPRAZolam, magnesium  hydroxide, nitroGLYCERIN, ondansetron (ZOFRAN) IV, ondansetron, tiZANidine, traZODone, Ubrogepant  Allergies:    Allergies  Allergen Reactions   Bee Venom Anaphylaxis   Onion     Other reaction(s): Respiratory Distress   Tetanus-Diphtheria Toxoids Td Anaphylaxis   Sumatriptan     Passed out, nose bleed    Asa [Aspirin] Nausea And Vomiting   Nsaids Nausea And Vomiting   Iodine Rash   Sulfa Antibiotics Rash   Vancomycin Rash    Social History:   Social History   Socioeconomic History   Marital status: Widowed    Spouse name: Not on file   Number of children: 6   Years of education: Not on file   Highest education level: Not on file  Occupational History   Not on file  Tobacco Use   Smoking status: Never   Smokeless tobacco: Never  Vaping Use   Vaping Use: Never used  Substance and Sexual Activity   Alcohol use: Never   Drug use: Never   Sexual activity: Not on file  Other Topics Concern   Not on file  Social History Narrative   Not on file   Social Determinants of Health   Financial Resource Strain: Not on file  Food Insecurity: Not on file  Transportation Needs: Not on file  Physical Activity: Not on file  Stress: Not on file  Social Connections: Not on file  Intimate Partner Violence: Not on file    Family History:    Family History  Adopted: Yes  Problem Relation Age of Onset   Bipolar disorder Son    Bipolar disorder Daughter      Review of Systems: [y] = yes, '[ ]'  = no    General: Weight gain '[ ]' ; Weight loss '[ ]' ; Anorexia '[ ]' ; Fatigue '[ ]' ; Fever '[ ]' ; Chills '[ ]' ; Weakness '[ ]'   Cardiac: Chest pain/pressure [ y]; Resting SOB '[ ]' ; Exertional SOB '[ ]' ; Orthopnea '[ ]' ; Pedal Edema '[ ]' ; Palpitations '[ ]' ; Syncope  '[ ]' ; Presyncope '[ ]' ; Paroxysmal nocturnal dyspnea'[ ]'   Pulmonary: Cough '[ ]' ; Wheezing'[ ]' ; Hemoptysis'[ ]' ; Sputum '[ ]' ; Snoring '[ ]'   GI: Vomiting'[ ]' ; Dysphagia'[ ]' ; Melena'[ ]' ; Hematochezia '[ ]' ; Heartburn'[ ]' ; Abdominal pain '[ ]' ; Constipation '[ ]' ; Diarrhea '[ ]' ; BRBPR '[ ]'   GU: Hematuria'[ ]' ; Dysuria '[ ]' ; Nocturia'[ ]'   Vascular: Pain in legs with walking '[ ]' ; Pain in feet with lying flat '[ ]' ; Non-healing sores '[ ]' ; Stroke '[ ]' ; TIA '[ ]' ; Slurred speech '[ ]' ;  Neuro: Headaches'[ ]' ; Vertigo'[ ]' ; Seizures'[ ]' ; Paresthesias'[ ]' ;Blurred vision '[ ]' ; Diplopia '[ ]' ; Vision changes '[ ]'   Ortho/Skin: Arthritis '[ ]' ; Joint pain '[ ]' ; Muscle pain '[ ]' ; Joint swelling '[ ]' ; Back Pain '[ ]' ; Rash '[ ]'   Psych: Depression'[ ]' ; Anxiety'[ ]'   Heme: Bleeding problems '[ ]' ; Clotting disorders '[ ]' ; Anemia '[ ]'   Endocrine: Diabetes '[ ]' ; Thyroid dysfunction'[ ]'   Physical Exam/Data:   Vitals:   11/05/21 1755 11/05/21 1800 11/05/21 1820 11/05/21 1923  BP: 113/74 118/83 126/79 125/68  Pulse: 78 78 74 71  Resp: 18 17 (!) 24 17  Temp:    97.6 F (36.4 C)  TempSrc:    Oral  SpO2: 100% 100% 100% 100%  Weight:      Height:        Intake/Output Summary (Last 24 hours) at 11/06/2021 0006 Last data filed at 11/05/2021 1339 Gross per 24 hour  Intake 2562.97 ml  Output --  Net 2562.97 ml   Filed Weights   11/05/21 1148  Weight: 97.3 kg   Body mass index is 34.64 kg/m.  General:   no acute distress, sleeping  Cardiac: ; RRR Lungs:  breathing comfortably Abd: soft, nontender, no hepatomegaly  Ext: no edema Musculoskeletal:  No deformities, BUE and BLE strength normal and equal Skin: warm and dry  Neuro:  CNs 2-12 intact, no focal abnormalities noted Psych:  Normal affect   EKG:  The EKG was personally reviewed and demonstrates:  nsr Telemetry:  Telemetry was personally reviewed and demonstrates:  nsr  Relevant CV Studies: none  Laboratory Data:  Chemistry Recent Labs  Lab 11/05/21 1155  NA 140  K 4.1  CL 114*  CO2 17*   GLUCOSE 201*  BUN 20  CREATININE 1.78*  CALCIUM 9.2  GFRNONAA 31*  ANIONGAP 9    Recent Labs  Lab 11/05/21 1155  PROT 6.5  ALBUMIN 3.4*  AST 22  ALT 18  ALKPHOS 41  BILITOT 0.6   Hematology Recent Labs  Lab 11/05/21 1155  WBC 8.8  RBC 3.99  HGB 12.5  HCT 38.8  MCV 97.2  MCH 31.3  MCHC 32.2  RDW 15.5  PLT 363   Cardiac EnzymesNo results for input(s): TROPONINI in the last 168 hours. No results for input(s): TROPIPOC in the last 168 hours.  BNPNo results for input(s): BNP, PROBNP in the last 168 hours.  DDimer No results for input(s): DDIMER in the last 168 hours.  Radiology/Studies:  DG Chest Port 1 View  Result Date: 11/05/2021 CLINICAL DATA:  Chest pain. EXAM: PORTABLE CHEST 1 VIEW COMPARISON:  April 22, 2020. FINDINGS: The heart size and mediastinal contours are within normal limits. Both lungs are clear. The visualized skeletal structures are unremarkable. IMPRESSION: No active disease. Electronically Signed   By: Marijo Conception M.D.   On: 11/05/2021 12:36    Assessment and Plan:   Chest pain.  It is most likely that her chest pain is secondary to her SVT, which is likely rapid A-fib, leading to chest discomfort and a very mild troponin leak of 5-25.  Given that Paula Stout is now chest pain-free and has had heart catheterization in 2021 with nonobstructive disease, normal EF then, and stress test after I do not believe Paula Stout warrants any further work-up at this time if Paula Stout remains chest pain-free. SVT/atrial fibrillation.  Continue metoprolol and flecainide and monitor on telemetry for recurrence.  Continue apixaban for anticoagulation.      For questions or updates, please contact [place] Please consult [email or web address] for contact info under     Signed, Doyne Keel, MD  11/06/2021 12:06 AM

## 2021-11-06 NOTE — Discharge Instructions (Signed)

## 2021-11-06 NOTE — Discharge Summary (Signed)
Paula Stout NID:[id number] DOB: [birth year] DOA: 11/05/2021  PCP: [name], DO  Admit date: 11/05/2021 Discharge date: 11/06/2021  Admitted From: home Disposition:  home  Recommendations for Outpatient Follow-up:  Follow up with PCP in 1 week Please obtain BMP/CBC in one week Please follow up with cardiology in one week      Discharge Condition:Stable CODE STATUS:full  Diet recommendation: Heart Healthy  Brief/Interim Summary: Per Paula Stout is a 66 y.o. female with medical history significant for paroxysmal atrial fibrillation, s/p cardiac ablation many years ago, asthma, hypertension bipolar 1 disorder, migraine, type II diabetes mellitus, seizure disorder, generalized anxiety disorder, dyslipidemia and IBS, presented to emergency room with acute onset of 10/10 substernal chest pain felt as pressure which woke her up this morning around 8:30 AM with palpitations, lightheadedness and associated nausea without vomiting with no radiation.  She stated that her heart rate per her watch was in the 150s and therefore she did not take any of her medications this morning.  She was given 6 mg of IV adenosine at [facility] with conversion to normal sinus rhythm.  She was seen by cardiology with [facility] on 09/16/2018 by Dr. Geraldo Pitter and her flecainide 50 mg p.o. twice daily was continued as well as Eliquis and Toprol-XL in February.  She was slight hypotensive when she presented to [facility] ED with a BP of 89/72 and when she was brought to room for systolic BP was in the 676P.  She was noted to be tachycardic with heart rate of 145 and EKG showed junctional tachycardia with a rate of 148 and left axis deviation repolarization abnormality.  She did not take any oral medications this morning.  She does get occasional tachycardia but will be billed.  This time it was persistent and therefore she presented to the ED.Cardiology consulted.  Her troponin minimally elevated.  Cardiology felt her chest pain  was likely secondary to SVT.  Currently asymptomatic.  She did not require further ischemic work-up.  They recommended continuing her metoprolol and flecainide.she is stable for discharge.   Chest pain.  It is most likely that her chest pain is secondary to her SVT, leading to chest discomfort .Given that she is now chest pain-free and has had heart catheterization in 2021 with nonobstructive disease, normal EF then, and stress test , no further ischemic workup at this time Troponin was mildly elevated from tachycardia and due to demand ischemia.      PSVT/atrial fibrillation.   Continue metoprolol and flecainide Continue anticoagulation with Eliquis Follow-up with cardiology may need to have a monitor placed as outpatient    Diabetes mellitus type 2 in obese ([place]) Continue home meds    Dyslipidemia Continue home meds   GERD (gastroesophageal reflux disease) Continue PPI    Bipolar 1 disorder, depressed, moderate (HCC) Continue Wellbutrin and Seroquel   Migraines Chronic Continue home meds  Discharge Diagnoses:  Principal Problem:   Chest pain Active Problems:   Paroxysmal supraventricular tachycardia (HCC)   Junctional tachycardia (HCC)   Diabetes mellitus type 2 in obese (HCC)   Dyslipidemia   Atrial fibrillation ([place])   Migraines   Bipolar 1 disorder, depressed, moderate (HCC)   Paroxysmal atrial fibrillation (HCC)   GERD (gastroesophageal reflux disease)    Discharge Instructions  Discharge Instructions     Diet - low sodium heart healthy   Complete by: As directed    Discharge instructions   Complete by: As directed    Follow-up with cardiology   Increase  activity slowly   Complete by: As directed       Allergies as of 11/06/2021       Reactions   Bee Venom Anaphylaxis   Onion Other (See Comments)   Respiratory Distress   Tetanus-diphtheria Toxoids Td Anaphylaxis   Sumatriptan    Passed out, nose bleed   Asa [aspirin] Nausea And Vomiting    Nsaids Nausea And Vomiting   Iodine Rash   Sulfa Antibiotics Rash   Vancomycin Rash        Medication List     TAKE these medications    acetaminophen 500 MG tablet Commonly known as: TYLENOL Take 1,000 mg by mouth every 6 (six) hours as needed for mild pain.   albuterol 108 (90 Base) MCG/ACT inhaler Commonly known as: VENTOLIN HFA Inhale 2 puffs into the lungs every 4 (four) hours as needed for wheezing.   apixaban 5 MG Tabs tablet Commonly known as: ELIQUIS Take 1 tablet (5 mg total) by mouth 2 (two) times daily.   atorvastatin 80 MG tablet Commonly known as: LIPITOR Take 1 tablet (80 mg total) by mouth daily. What changed: when to take this   B COMPLEX PO Take 1 tablet by mouth daily.   blood glucose meter kit and supplies One Touch Ultra  Check blood sugars once daily Dx: E11.9   buPROPion 300 MG 24 hr tablet Commonly known as: WELLBUTRIN XL Take 1 tablet (300 mg total) by mouth daily.   Emgality 120 MG/ML Soaj Generic drug: Galcanezumab-gnlm Inject 120 mg into the skin every 30 (thirty) days.   EPINEPHrine 0.3 mg/0.3 mL Soaj injection Commonly known as: EPI-PEN Inject 0.3 mg into the muscle as needed for anaphylaxis.   fenofibrate 160 MG tablet Take 1 tablet (160 mg total) by mouth daily. What changed: when to take this   flecainide 50 MG tablet Commonly known as: TAMBOCOR Take 1 tablet (50 mg total) by mouth 2 (two) times daily.   isosorbide mononitrate 30 MG 24 hr tablet Commonly known as: IMDUR Take 0.5 tablets (15 mg total) by mouth daily.   levocetirizine 5 MG tablet Commonly known as: XYZAL Take 1 tablet (5 mg total) by mouth every evening.   Lurasidone HCl 120 MG Tabs Take 1 tablet (120 mg total) by mouth at bedtime.   metFORMIN 1000 MG tablet Commonly known as: GLUCOPHAGE Take 1 tablet (1,000 mg total) by mouth 2 (two) times daily with a meal.   metoprolol succinate 50 MG 24 hr tablet Commonly known as: TOPROL-XL Take 1 tablet  (50 mg total) by mouth daily. Take with or immediately following a meal.   nitroGLYCERIN 0.4 MG SL tablet Commonly known as: NITROSTAT Place 1 tablet (0.4 mg total) under the tongue every 5 (five) minutes as needed for chest pain.   ondansetron 4 MG disintegrating tablet Commonly known as: ZOFRAN-ODT Take 1 tablet (4 mg total) by mouth every 8 (eight) hours as needed for nausea.   OneTouch Verio test strip Generic drug: glucose blood Use daily to check blood sugar.   pantoprazole 40 MG tablet Commonly known as: PROTONIX Take 1 tablet (40 mg total) by mouth 2 (two) times daily before a meal.   QUEtiapine 300 MG tablet Commonly known as: SEROquel Take 1 tablet (300 mg total) by mouth at bedtime.   tiZANidine 4 MG tablet Commonly known as: Zanaflex Take 1 tablet (4 mg total) by mouth every 6 (six) hours as needed for muscle spasms.   topiramate 50 MG tablet Commonly  known as: TOPAMAX Take 3 tablets (150 mg total) by mouth 2 (two) times daily.   Trulicity 1.5 TS/1.7BL Sopn Generic drug: Dulaglutide Inject 1.5 mg into the skin as directed once a week. What changed: when to take this   Ubrogepant 100 MG Tabs Take 100 mg by mouth daily as needed for migraine.   VITAMIN D PO Take 1 tablet by mouth daily.        Follow-up Information     Constance Haw, MD Follow up in 1 week(s).   Specialty: Cardiology Contact information: [address] [place] [address] [phone]         [name], DO Follow up on 11/10/2021.   Specialty: Family Medicine Why: '@11' :15am Contact information: [address] [place] [address] [phone]                Allergies  Allergen Reactions   Bee Venom Anaphylaxis   Onion Other (See Comments)    Respiratory Distress   Tetanus-Diphtheria Toxoids Td Anaphylaxis   Sumatriptan     Passed out, nose bleed    Asa [Aspirin] Nausea And Vomiting   Nsaids Nausea And Vomiting    Iodine Rash   Sulfa Antibiotics Rash   Vancomycin Rash    Consultations: Cardiology   Procedures/Studies: CT Head Wo Contrast  Result Date: 10/15/2021 CLINICAL DATA:  Migraine for the past 2 days. EXAM: CT HEAD WITHOUT CONTRAST TECHNIQUE: Contiguous axial images were obtained from the base of the skull through the vertex without intravenous contrast. RADIATION DOSE REDUCTION: This exam was performed according to the departmental dose-optimization program which includes automated exposure control, adjustment of the mA and/or kV according to patient size and/or use of iterative reconstruction technique. COMPARISON:  MRI brain dated December 02, 2020. FINDINGS: Brain: No evidence of acute infarction, hemorrhage, hydrocephalus, extra-axial collection or mass lesion/mass effect. Vascular: Atherosclerotic vascular calcification of the carotid siphons. No hyperdense vessel. Skull: Normal. Negative for fracture or focal lesion. Sinuses/Orbits: No acute finding. 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 Other: None. IMPRESSION: 1. No acute intracranial abnormality. Electronically Signed   By: Titus Dubin M.D.   On: 10/15/2021 18:50   [place] LIMITED JOINT SPACE STRUCTURES UP LEFT  Result Date: 10/21/2021 Limited ultrasound: Left shoulder:   Mild effusion of the superior aspect bicipital tendon Effusion changes appreciated overlying the subscapularis. Normal-appearing supraspinatus. Mild effusion of the posterior glenohumeral joint.   Summary: Mild effusion within the shoulder joint.   Ultrasound and interpretation by Clearance Coots, MD     Aspiration/Injection Procedure Note Aliana Kreischer 1955/12/30   Procedure: Injection Indications: Left shoulder pain   Procedure Details Consent: Risks of procedure as well as the alternatives and risks of each were explained to the (patient/caregiver).  Consent for procedure obtained. Time Out: Verified patient identification, verified procedure, site/side was  marked, verified correct patient position, special equipment/implants available, medications/allergies/relevent history reviewed, required imaging and test results available.  Performed.  The area was cleaned with iodine and alcohol swabs.    The left glenohumeral joint was injected using 3 cc of 1% lidocaine on a 22-gauge 3-1/2 inch needle.  The syringe was switched and a mixture containing 1 cc's of 40 mg Kenalog and 4 cc's of 0.25% bupivacaine was injected.  Ultrasound was used. Images were obtained in short views showing the injection.      A sterile dressing was applied.   Patient did tolerate procedure well.  DG  Chest Port 1 View  Result Date: 11/05/2021 CLINICAL DATA:  Chest pain. EXAM: PORTABLE CHEST 1 VIEW COMPARISON:  April 22, 2020. FINDINGS: The heart size and mediastinal contours are within normal limits. Both lungs are clear. The visualized skeletal structures are unremarkable. IMPRESSION: No active disease. Electronically Signed   By: Marijo Conception M.D.   On: 11/05/2021 12:36      Subjective: No chest pain or shortness of breath.  No dizziness.  In normal sinus  Discharge Exam: Vitals:   11/06/21 0824 11/06/21 1100  BP: (!) 100/56 124/74  Pulse: 69 62  Resp: 15 15  Temp: 97.7 F (36.5 C)   SpO2: 97%    Vitals:   11/06/21 0013 11/06/21 0431 11/06/21 0824 11/06/21 1100  BP: 124/88 (!) 104/57 (!) 100/56 124/74  Pulse: 65 60 69 62  Resp: '18 14 15 15  ' Temp: (!) 97.5 F (36.4 C) 97.9 F (36.6 C) 97.7 F (36.5 C)   TempSrc: Oral Oral Oral   SpO2: 99% 97% 97%   Weight:      Height:        General: Pt is alert, awake, not in acute distress Cardiovascular: RRR, S1/S2 +, no rubs, no gallops Respiratory: CTA bilaterally, no wheezing, no rhonchi Abdominal: Soft, NT, ND, bowel sounds + Extremities: no edema, no cyanosis    The results of significant diagnostics from this hospitalization (including imaging, microbiology, ancillary and laboratory) are listed below for  reference.     Microbiology: Recent Results (from the past 240 hour(s))  Resp Panel by RT-PCR (Flu A&B, Covid) Nasopharyngeal Swab     Status: None   Collection Time: 11/05/21 12:14 PM   Specimen: Nasopharyngeal Swab; Nasopharyngeal(NP) swabs in vial transport medium  Result Value Ref Range Status   SARS Coronavirus 2 by RT PCR NEGATIVE NEGATIVE Final    Comment: (NOTE) SARS-CoV-2 target nucleic acids are NOT DETECTED.  The SARS-CoV-2 RNA is generally detectable in upper respiratory specimens during the acute phase of infection. The lowest concentration of SARS-CoV-2 viral copies this assay can detect is 138 copies/mL. A negative result does not preclude SARS-Cov-2 infection and should not be used as the sole basis for treatment or other patient management decisions. A negative result may occur with  improper specimen collection/handling, submission of specimen other than nasopharyngeal swab, presence of viral mutation(s) within the areas targeted by this assay, and inadequate number of viral copies(<138 copies/mL). A negative result must be combined with clinical observations, patient history, and epidemiological information. The expected result is Negative.  Fact Sheet for Patients:  EntrepreneurPulse.com.au  Fact Sheet for Healthcare Providers:  IncredibleEmployment.be  This test is no t yet approved or cleared by the [place] FDA and  has been authorized for detection and/or diagnosis of SARS-CoV-2 by FDA under an Emergency Use Authorization (EUA). This EUA will remain  in effect (meaning this test can be used) for the duration of the COVID-19 declaration under Section 564(b)(1) of the Act, 21 U.S.C.section 360bbb-3(b)(1), unless the authorization is terminated  or revoked sooner.       Influenza A by PCR NEGATIVE NEGATIVE Final   Influenza B by PCR NEGATIVE NEGATIVE Final    Comment: (NOTE) The Xpert Xpress SARS-CoV-2/FLU/RSV  plus assay is intended as an aid in the diagnosis of influenza from Nasopharyngeal swab specimens and should not be used as a sole basis for treatment. Nasal washings and aspirates are unacceptable for Xpert Xpress SARS-CoV-2/FLU/RSV testing.  Fact Sheet for Patients: EntrepreneurPulse.com.au  Fact Sheet for Healthcare Providers: IncredibleEmployment.be  This test is not yet approved or cleared by the [place] FDA and has been authorized for detection and/or diagnosis of SARS-CoV-2 by FDA under an Emergency Use Authorization (EUA). This EUA will remain in effect (meaning this test can be used) for the duration of the COVID-19 declaration under Section 564(b)(1) of the Act, 21 U.S.C. section 360bbb-3(b)(1), unless the authorization is terminated or revoked.  Performed at [facility], [address] [place], [place] [address]      Labs: BNP (last 3 results) No results for input(s): BNP in the last 8760 hours. Basic Metabolic Panel: Recent Labs  Lab 11/05/21 1155 11/05/21 2251 11/06/21 0354  NA 140  --  142  K 4.1  --  4.1  CL 114*  --  117*  CO2 17*  --  18*  GLUCOSE 201*  --  116*  BUN 20  --  13  CREATININE 1.78*  --  1.41*  CALCIUM 9.2  --  8.3*  MG  --  1.4*  --    Liver Function Tests: Recent Labs  Lab 11/05/21 1155  AST 22  ALT 18  ALKPHOS 41  BILITOT 0.6  PROT 6.5  ALBUMIN 3.4*   No results for input(s): LIPASE, AMYLASE in the last 168 hours. No results for input(s): AMMONIA in the last 168 hours. CBC: Recent Labs  Lab 11/05/21 1155  WBC 8.8  NEUTROABS 5.0  HGB 12.5  HCT 38.8  MCV 97.2  PLT 363   Cardiac Enzymes: No results for input(s): CKTOTAL, CKMB, CKMBINDEX, TROPONINI in the last 168 hours. BNP: Invalid input(s): POCBNP CBG: Recent Labs  Lab 11/05/21 2114  GLUCAP 120*   D-Dimer No results for input(s): DDIMER in the last 72 hours. Hgb A1c No results for input(s): HGBA1C in  the last 72 hours. Lipid Profile Recent Labs    11/06/21 0354  CHOL 93  HDL 21*  LDLCALC 48  TRIG 119  CHOLHDL 4.4   Thyroid function studies No results for input(s): TSH, T4TOTAL, T3FREE, THYROIDAB in the last 72 hours.  Invalid input(s): FREET3 Anemia work up No results for input(s): VITAMINB12, FOLATE, FERRITIN, TIBC, IRON, RETICCTPCT in the last 72 hours. Urinalysis No results found for: COLORURINE, APPEARANCEUR, [place], [place], GLUCOSEU, [place], [place], [place], PROTEINUR, UROBILINOGEN, NITRITE, LEUKOCYTESUR Sepsis Labs Invalid input(s): PROCALCITONIN,  WBC,  LACTICIDVEN Microbiology Recent Results (from the past 240 hour(s))  Resp Panel by RT-PCR (Flu A&B, Covid) Nasopharyngeal Swab     Status: None   Collection Time: 11/05/21 12:14 PM   Specimen: Nasopharyngeal Swab; Nasopharyngeal(NP) swabs in vial transport medium  Result Value Ref Range Status   SARS Coronavirus 2 by RT PCR NEGATIVE NEGATIVE Final    Comment: (NOTE) SARS-CoV-2 target nucleic acids are NOT DETECTED.  The SARS-CoV-2 RNA is generally detectable in upper respiratory specimens during the acute phase of infection. The lowest concentration of SARS-CoV-2 viral copies this assay can detect is 138 copies/mL. A negative result does not preclude SARS-Cov-2 infection and should not be used as the sole basis for treatment or other patient management decisions. A negative result may occur with  improper specimen collection/handling, submission of specimen other than nasopharyngeal swab, presence of viral mutation(s) within the areas targeted by this assay, and inadequate number of viral copies(<138 copies/mL). A negative result must be combined with clinical observations, patient history, and epidemiological information. The expected result is Negative.  Fact Sheet for Patients:  EntrepreneurPulse.com.au  Fact  Sheet for Healthcare Providers:   IncredibleEmployment.be  This test is no t yet approved or cleared by the [place] FDA and  has been authorized for detection and/or diagnosis of SARS-CoV-2 by FDA under an Emergency Use Authorization (EUA). This EUA will remain  in effect (meaning this test can be used) for the duration of the COVID-19 declaration under Section 564(b)(1) of the Act, 21 U.S.C.section 360bbb-3(b)(1), unless the authorization is terminated  or revoked sooner.       Influenza A by PCR NEGATIVE NEGATIVE Final   Influenza B by PCR NEGATIVE NEGATIVE Final    Comment: (NOTE) The Xpert Xpress SARS-CoV-2/FLU/RSV plus assay is intended as an aid in the diagnosis of influenza from Nasopharyngeal swab specimens and should not be used as a sole basis for treatment. Nasal washings and aspirates are unacceptable for Xpert Xpress SARS-CoV-2/FLU/RSV testing.  Fact Sheet for Patients: EntrepreneurPulse.com.au  Fact Sheet for Healthcare Providers: IncredibleEmployment.be  This test is not yet approved or cleared by the [place] FDA and has been authorized for detection and/or diagnosis of SARS-CoV-2 by FDA under an Emergency Use Authorization (EUA). This EUA will remain in effect (meaning this test can be used) for the duration of the COVID-19 declaration under Section 564(b)(1) of the Act, 21 U.S.C. section 360bbb-3(b)(1), unless the authorization is terminated or revoked.  Performed at [facility], [address] [place], [place] [address]      Time coordinating discharge: Over 30 minutes  SIGNED:   Nolberto Hanlon, MD  [facility] 11/06/2021, 2:43 PM Pager   If 7PM-7AM, please contact night-coverage [email or web address] Password [email or web address]

## 2021-11-06 NOTE — TOC Progression Note (Signed)
Transition of Care (TOC) - Progression Note  ? ? ?Patient Details  ?Name: Paula Stout ?MRN: [id number] ?Date of Birth: [birth year] ? ?Transition of Care (TOC) CM/SW Contact  ?Zenon Mayo, RN ?Phone Number: ?11/06/2021, 8:59 AM ? ?Clinical Narrative:    ?From home chest pain, afib,  TOC will continue to follow for dc needs.   ? ? ?  ?  ? ?Expected Discharge Plan and Services ?  ?  ?  ?  ?  ?                ?  ?  ?  ?  ?  ?  ?  ?  ?  ?  ? ? ?Social Determinants of Health (SDOH) Interventions ?  ? ?Readmission Risk Interventions ?No flowsheet data found. ? ?

## 2021-11-06 NOTE — TOC Transition Note (Signed)
Transition of Care (TOC) - CM/SW Discharge Note ? ? ?Patient Details  ?Name: Paula Stout ?MRN: [id number] ?Date of Birth: 04/04/1956 ? ?Transition of Care (TOC) CM/SW Contact:  ?Zenon Mayo, RN ?Phone Number: ?11/06/2021, 2:06 PM ? ? ?Clinical Narrative:    ?For dc today, has no needs.  ? ? ?Final next level of care: Home/Self Care ?Barriers to Discharge: No Barriers Identified ? ? ?Patient Goals and CMS Choice ?  ?  ?Choice offered to / list presented to : NA ? ?Discharge Placement ?  ?           ?  ?  ?  ?  ? ?Discharge Plan and Services ?  ?  ?           ?  ?DME Agency: NA ?  ?  ?  ?  ?  ?  ?  ?  ? ?Social Determinants of Health (SDOH) Interventions ?  ? ? ?Readmission Risk Interventions ?No flowsheet data found. ? ? ? ? ?

## 2021-11-09 MED ORDER — EMGALITY 120 MG/ML ~~LOC~~ SOAJ
120.0000 mg | SUBCUTANEOUS | 2 refills | Status: DC
Start: 2021-11-09 — End: 2022-01-04

## 2021-11-09 NOTE — Telephone Encounter (Signed)
Referral was changed to [facility]. ?

## 2021-11-09 NOTE — Telephone Encounter (Signed)
Can you check with [facility] on the neurology referral?  Looks like Dr. Tomi Likens office did not want to schedule her.  Not sure if they know why the patient wants to see them.  She did not care for provider at [facility]. ?

## 2021-11-10 ENCOUNTER — Ambulatory Visit (INDEPENDENT_AMBULATORY_CARE_PROVIDER_SITE_OTHER): Payer: Managed Care, Other (non HMO) | Admitting: Family Medicine

## 2021-11-10 ENCOUNTER — Encounter: Payer: Self-pay | Admitting: Family Medicine

## 2021-11-10 VITALS — BP 95/62 | HR 62 | Temp 97.4°F | Resp 12 | Ht 66.0 in | Wt 210.8 lb

## 2021-11-10 DIAGNOSIS — R42 Dizziness and giddiness: Secondary | ICD-10-CM | POA: Diagnosis not present

## 2021-11-10 DIAGNOSIS — R079 Chest pain, unspecified: Secondary | ICD-10-CM

## 2021-11-10 DIAGNOSIS — I471 Supraventricular tachycardia: Secondary | ICD-10-CM

## 2021-11-10 LAB — CBC
HCT: 35.7 % — ABNORMAL LOW (ref 36.0–46.0)
Hemoglobin: 11.7 g/dL — ABNORMAL LOW (ref 12.0–15.0)
MCHC: 32.8 g/dL (ref 30.0–36.0)
MCV: 96 fl (ref 78.0–100.0)
Platelets: 320 10*3/uL (ref 150.0–400.0)
RBC: 3.72 Mil/uL — ABNORMAL LOW (ref 3.87–5.11)
RDW: 15.9 % — ABNORMAL HIGH (ref 11.5–15.5)
WBC: 6.5 10*3/uL (ref 4.0–10.5)

## 2021-11-10 LAB — BASIC METABOLIC PANEL
BUN: 21 mg/dL (ref 6–23)
CO2: 22 mEq/L (ref 19–32)
Calcium: 9.8 mg/dL (ref 8.4–10.5)
Chloride: 109 mEq/L (ref 96–112)
Creatinine, Ser: 1.76 mg/dL — ABNORMAL HIGH (ref 0.40–1.20)
GFR: 30.29 mL/min — ABNORMAL LOW (ref >60.00)
Glucose, Bld: 108 mg/dL — ABNORMAL HIGH (ref 70–99)
Potassium: 4.2 mEq/L (ref 3.5–5.1)
Sodium: 139 mEq/L (ref 135–145)

## 2021-11-10 MED ORDER — ATENOLOL 50 MG PO TABS: 50.0000 mg | ORAL_TABLET | Freq: Every day | ORAL | 3 refills | Status: DC

## 2021-11-10 MED ORDER — ATORVASTATIN CALCIUM 80 MG PO TABS: 80.0000 mg | ORAL_TABLET | Freq: Every day | ORAL | 3 refills | Status: DC

## 2021-11-10 NOTE — Patient Instructions (Signed)
Give [place] 2-3 business days to get the results of your labs back.  ? ?Stay hydrated. ? ?Consider an air humidifier at night.  ? ?Let us know if you need anything. ?

## 2021-11-10 NOTE — Progress Notes (Signed)
Chief Complaint  Patient presents with   Hospitalization Follow-up    HPI Paula Stout is a 66 y.o. y.o. female who presents for a transition of care visit.  Pt was discharged from [facility] on 11/06/2021.  She is presenting within 48 business hours of discharge.  Patient was admitted on 3/2 and discharged on 3/3 for chest pain rule out.  Troponin was initially slightly elevated.  Her heart rate was in the 170s at home.  She was given 6 mg of adenosine with conversion to normal sinus rhythm after being found in SVT.  She was slightly hypotensive, EKG showed junctional tachycardia without any signs of ischemia.  Cardiology felt her pain was likely due to SVT and no further management recommended while inpatient.  She has an appointment with her cardiologist next week.  Since being discharged, she reports continued palpitations, chest pain, shortness of breath, lightheadedness, and fatigue.  She states she does drink lots of water but wakes up with a very dry mouth.  Diet is fair.  She is compliant with her flecainide 50 mg twice daily and metoprolol XL 50 mg daily.  She does not monitor her blood pressure at home.  She does not have any symptoms at rest typically.  Past Medical History:  Diagnosis Date   A-fib [facility])    Abscess 02/03/2019   Acute cystitis without hematuria 05/14/2017   Last Assessment & Plan:  Formatting of this note might be different from the original. - suprapubic tenderness with symptoms of urinary frequency and urgency and subjective fevers (no fevers recorded on any visit). Recently passed a renal stone, US shows left hydronephrosis which is improving.  - UA negative8/24 but pt is clinically symptomatic - will repeat UA  - start pt on Microbid for 5 days   Adhesive capsulitis of left shoulder 12/16/2016   Anxiety    Asthma    Asthma without status asthmaticus 02/06/2021   At risk for falls 02/04/2019   Atrial fibrillation ([place]) 09/01/2014   Last Assessment & Plan:   Formatting of this note might be different from the original. A:  Chronic.  Sinus rhythm at this time.  States she was told this during admission at [facility] in the past. P:  On baby aspirin at home will continue.  Low dose metoprolol started.   Bipolar 1 disorder ([place]) 10/11/2013   Bipolar 1 disorder, depressed, moderate ([place]) 02/18/2020   Bipolar disorder ([place])    Cellulitis 02/03/2019   Chest pain 02/19/2020   Chronic anticoagulation 02/20/2020   Chronic atrial fibrillation ([place]) 02/18/2020   Chronic headache 10/11/2013   Chronic migraine without aura, intractable, without status migrainosus 06/24/2017   Chronic pain of both shoulders 11/14/2015   Colon cancer screening 04/15/2016   Last Assessment & Plan:  Formatting of this note might be different from the original. Will schedule for colonoscopy   Depressed bipolar I disorder ([place]) 02/06/2021   Depression    Depression, recurrent ([place]) 02/18/2020   Diabetes mellitus ([place])    Diabetes mellitus due to underlying condition with unspecified complications ([place]) 04/12/5642   Diabetes mellitus type 2 in obese ([place]) 10/11/2013   Last Assessment & Plan:  Formatting of this note might be different from the original. A:  Diabetes Mellitus Type 2 without long term use of insulin P:  Hold Home metformin. Reg ISS.   Diabetes mellitus without complication ([place])    Dyslipidemia 02/20/2020   Epilepsy ([place]) 02/06/2021   GAD (generalized anxiety disorder) 02/18/2020  Heartburn 04/15/2016   Last Assessment & Plan:  Formatting of this note might be different from the original. Patient was counseled regarding lifestyle modification  Advised to take Prilosec 30 mins before breakfast  Will schedule for EGD   History of atrial fibrillation 06/24/2017   History of posttraumatic stress disorder (PTSD) 10/07/2016   HLD (hyperlipidemia) 09/01/2014   Last Assessment & Plan:  Formatting of this note might be different from the original. A: Chronic. P: Continue statin.   Hyperglycemia  due to type 2 diabetes mellitus ([place]) 02/06/2021   Hyperosmolarity due to secondary diabetes mellitus ([place]) 02/06/2021   Hypertriglyceridemia 02/18/2020   Impaired mobility and activities of daily living 11/14/2015   Insomnia 02/06/2021   Irritable bowel syndrome (IBS)    Kidney stone 02/06/2021   Left elbow pain 11/14/2015   Migraine without aura, not refractory 02/06/2021   Migraines 10/11/2013   Last Assessment & Plan:  Formatting of this note might be different from the original. A: Chronic. P: Continue Topamax. (also takes it for bipolar)   Morbid obesity ([place]) 12/16/2016   Multiple sclerosis ([place]) 06/24/2017   Obesity (BMI 30-39.9) 09/01/2014   Last Assessment & Plan:  Formatting of this note might be different from the original. A: Morbid obesity, with BMI of 35.0 - 39.9 P: Counseled to loose wt.   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 A: Pt has 2 months exertional angina, recently developed angina at rest intermittently.  She had a positive nuclear medicine cardiolyte stress test with anteroapical ischemia on stress portion not seen at rest. P:  - continue aspirin, statin, metoprolol - Discussed stress test findings with cardiologist Dr. Landis Gandy   Urinary tract obstruction 02/06/2021   Past Surgical History:  Procedure Laterality Date   ABDOMINAL HYSTERECTOMY     Fibroids   ATRIAL FIBRILLATION ABLATION     CARDIAC CATHETERIZATION     CESAREAN SECTION     KNEE SURGERY     LEFT HEART CATH AND CORONARY ANGIOGRAPHY N/A 03/04/2020    Procedure: LEFT HEART CATH AND CORONARY ANGIOGRAPHY;  Surgeon: Belva Crome, MD;  Location: [facility] [facility];  Service: Cardiovascular;  Laterality: N/A;   ROTATOR CUFF REPAIR     Family History  Adopted: Yes  Problem Relation Age of Onset   Bipolar disorder Son    Bipolar disorder Daughter    Allergies as of 11/10/2021       Reactions   Bee Venom Anaphylaxis   Onion Other (See Comments)   Respiratory Distress   Tetanus-diphtheria Toxoids Td Anaphylaxis   Sumatriptan    Passed out, nose bleed   Asa [aspirin] Nausea And Vomiting   Nsaids Nausea And Vomiting   Iodine Rash   Sulfa Antibiotics Rash   Vancomycin Rash        Medication List        Accurate as of November 10, 2021 11:42 AM. If you have any questions, ask your nurse or doctor.          STOP taking these  medications    metoprolol succinate 50 MG 24 hr tablet Commonly known as: TOPROL-XL Stopped by: [name], DO       TAKE these medications    acetaminophen 500 MG tablet Commonly known as: TYLENOL Take 1,000 mg by mouth every 6 (six) hours as needed for mild pain.   albuterol 108 (90 Base) MCG/ACT inhaler Commonly known as: VENTOLIN HFA Inhale 2 puffs into the lungs every 4 (four) hours as needed for wheezing.   apixaban 5 MG Tabs tablet Commonly known as: ELIQUIS Take 1 tablet (5 mg total) by mouth 2 (two) times daily.   atenolol 50 MG tablet Commonly known as: TENORMIN Take 1 tablet (50 mg total) by mouth daily. Started by: [name], DO   atorvastatin 80 MG tablet Commonly known as: LIPITOR Take 1 tablet (80 mg total) by mouth daily. What changed: when to take this   B COMPLEX PO Take 1 tablet by mouth daily.   blood glucose meter kit and supplies One Touch Ultra  Check blood sugars once daily Dx: E11.9   buPROPion 300 MG 24 hr tablet Commonly known as: WELLBUTRIN XL Take 1 tablet (300 mg total) by mouth daily.   Emgality 120 MG/ML Soaj Generic drug:  Galcanezumab-gnlm Inject 120 mg into the skin every 30 (thirty) days.   EPINEPHrine 0.3 mg/0.3 mL Soaj injection Commonly known as: EPI-PEN Inject 0.3 mg into the muscle as needed for anaphylaxis.   fenofibrate 160 MG tablet Take 1 tablet (160 mg total) by mouth daily. What changed: when to take this   flecainide 50 MG tablet Commonly known as: TAMBOCOR Take 1 tablet (50 mg total) by mouth 2 (two) times daily.   isosorbide mononitrate 30 MG 24 hr tablet Commonly known as: IMDUR Take 0.5 tablets (15 mg total) by mouth daily.   levocetirizine 5 MG tablet Commonly known as: XYZAL Take 1 tablet (5 mg total) by mouth every evening.   Lurasidone HCl 120 MG Tabs Take 1 tablet (120 mg total) by mouth at bedtime.   metFORMIN 1000 MG tablet Commonly known as: GLUCOPHAGE Take 1 tablet (1,000 mg total) by mouth 2 (two) times daily with a meal.   nitroGLYCERIN 0.4 MG SL tablet Commonly known as: NITROSTAT Place 1 tablet (0.4 mg total) under the tongue every 5 (five) minutes as needed for chest pain.   ondansetron 4 MG disintegrating tablet Commonly known as: ZOFRAN-ODT Take 1 tablet (4 mg total) by mouth every 8 (eight) hours as needed for nausea.   OneTouch Verio test strip Generic drug: glucose blood Use daily to check blood sugar.   pantoprazole 40 MG tablet Commonly known as: PROTONIX Take 1 tablet (40 mg total) by mouth 2 (two) times daily before a meal.   QUEtiapine 300 MG tablet Commonly known as: SEROquel Take 1 tablet (300 mg total) by mouth at bedtime.   tiZANidine 4 MG tablet Commonly known as: Zanaflex Take 1 tablet (4 mg total) by mouth every 6 (six) hours as needed for muscle spasms.   topiramate 50 MG tablet Commonly known as: TOPAMAX Take 3 tablets (150 mg total) by mouth 2 (two) times daily.   Trulicity 1.5 IW/5.8KD Sopn Generic drug: Dulaglutide Inject 1.5 mg into the skin as directed once a week. What changed: when to take this   Ubrogepant 100 MG  Tabs Take 100 mg by mouth daily as needed for migraine.   VITAMIN D PO Take 1 tablet by mouth daily.  ROS:  Constitutional: No fevers or chills, no weight loss HEENT: No headaches, hearing loss, or runny nose, no sore throat Heart: +chest pain Lungs: + Shortness of breath Abd: No bowel changes, no pain, no N/V GU: No urinary complaints Neuro: No numbness, tingling or weakness Msk: No joint or muscle pain  Objective BP 95/62 (BP Location: Right Arm, Cuff Size: Normal)    Pulse 62    Temp (!) 97.4 F (36.3 C) (Oral)    Resp 12    Ht '5\' 6"'  (1.676 m)    Wt 210 lb 12.8 oz (95.6 kg)    SpO2 98%    BMI 34.02 kg/m  General Appearance:  awake, alert, oriented, in no acute distress and well developed, well nourished Skin:  there are no suspicious lesions or rashes of concern Head/face:  NCAT Eyes:  EOMI, PERRLA Ears:  canals and TMs NI Nose/Sinuses:  negative Mouth/Throat:  Mucosa dry, no lesions; pharynx without erythema, edema or exudate. Neck:  neck- supple, no mass, non-tender and no jvd Lungs: Clear to auscultation.  No rales, rhonchi, or wheezing. Normal effort, no accessory muscle use. Heart:  Heart sounds are normal.  Regular rate and rhythm without murmur, gallop or rub. No bruits. Abdomen:  BS+, soft, NT, ND, no masses or organomegaly Musculoskeletal:  No muscle group atrophy or asymmetry, gait normal Neurologic:  Alert and oriented x 3, gait normal.,  No cerebellar signs Psych exam: Nml mood and affect, age appropriate judgment and insight  SVT (supraventricular tachycardia) ([place]) - Plan: CBC, Basic metabolic panel  Lightheaded  Chest pain, unspecified type  Discharge summary and medication list have been reviewed/reconciled.  Labs pending at the time of discharge have been reviewed or are still pending at the time of this visit.  Follow-up labs and appointments have been ordered and/or coordinated appropriately. Follow-up on inpatient labs. Still having chest  pain, lightheadedness, and palpitations.  I think part of this could be related to low blood pressure.  We will change her metoprolol to atenolol 50 mg daily.  Continue flecainide 50 mg twice daily.  Needs to increase hydration.  She has a follow-up with cardiology next week.  I would like to see her next month for a diabetes visit.  TRANSITIONAL CARE MANAGEMENT CERTIFICATION:  I certify the following are true:   1. Communication with the patient/care giver was made within 2 business days of discharge.  2. Complexity of Medical decision making is moderate.  3. Face to face visit occurred within 14 days of discharge.   The patient voiced understanding and agreement to the plan.  [place], DO 11/10/21 11:42 AM

## 2021-11-17 ENCOUNTER — Encounter: Payer: Self-pay | Admitting: Family Medicine

## 2021-11-17 ENCOUNTER — Ambulatory Visit (INDEPENDENT_AMBULATORY_CARE_PROVIDER_SITE_OTHER): Payer: Managed Care, Other (non HMO) | Admitting: Family Medicine

## 2021-11-17 VITALS — BP 90/62 | Ht 66.0 in | Wt 210.0 lb

## 2021-11-17 DIAGNOSIS — M778 Other enthesopathies, not elsewhere classified: Secondary | ICD-10-CM | POA: Diagnosis not present

## 2021-11-17 NOTE — Patient Instructions (Signed)
Good to see you ?Please try heat  ?Please try the exercises  ?If you aren't making improvement then we can start shockwave therapy   ?Please send me a message in MyChart with any questions or updates.  ?Please see me back in 4-6 weeks.  ? ?--Dr. Raeford Razor ? ?

## 2021-11-17 NOTE — Progress Notes (Signed)
?Paula Stout - 66 y.o. female MRN [id number]  Date of birth: 11/16/1955 ? ?SUBJECTIVE:  Including CC & ROS.  ?No chief complaint on file. ? ? ?Paula Stout is a 66 y.o. female that is following up for her left shoulder pain.  Her pain has improved since the injection but she still notices some tightness and pain when she is in and external rotation movements. ? ? ?Review of Systems ?See HPI  ? ?HISTORY: Past Medical, Surgical, Social, and Family History Reviewed & Updated per EMR.   ?Pertinent Historical Findings include: ? ?Past Medical History:  ?Diagnosis Date  ? A-fib ([place])   ? Abscess 02/03/2019  ? Acute cystitis without hematuria 05/14/2017  ? Last Assessment & Plan:  Formatting of this note might be different from the original. - suprapubic tenderness with symptoms of urinary frequency and urgency and subjective fevers (no fevers recorded on any visit). Recently passed a renal stone, US shows left hydronephrosis which is improving.  - UA negative8/24 but pt is clinically symptomatic - will repeat UA  - start pt on Microbid for 5 days  ? Adhesive capsulitis of left shoulder 12/16/2016  ? Anxiety   ? Asthma   ? Asthma without status asthmaticus 02/06/2021  ? At risk for falls 02/04/2019  ? Atrial fibrillation ([place]) 09/01/2014  ? Last Assessment & Plan:  Formatting of this note might be different from the original. A:  Chronic.  Sinus rhythm at this time.  States she was told this during admission at [facility] in the past. P:  On baby aspirin at home will continue.  Low dose metoprolol started.  ? Bipolar 1 disorder ([place]) 10/11/2013  ? Bipolar 1 disorder, depressed, moderate ([place]) 02/18/2020  ? Bipolar disorder ([place])   ? Cellulitis 02/03/2019  ? Chest pain 02/19/2020  ? Chronic anticoagulation 02/20/2020  ? Chronic atrial fibrillation ([place]) 02/18/2020  ? Chronic headache 10/11/2013  ? Chronic migraine without aura, intractable, without status migrainosus 06/24/2017  ? Chronic pain of both shoulders 11/14/2015  ? Colon  cancer screening 04/15/2016  ? Last Assessment & Plan:  Formatting of this note might be different from the original. Will schedule for colonoscopy  ? Depressed bipolar I disorder ([place]) 02/06/2021  ? Depression   ? Depression, recurrent ([place]) 02/18/2020  ? Diabetes mellitus ([place])   ? Diabetes mellitus due to underlying condition with unspecified complications ([place]) 4/0/9811  ? Diabetes mellitus type 2 in obese ([place]) 10/11/2013  ? Last Assessment & Plan:  Formatting of this note might be different from the original. A:  Diabetes Mellitus Type 2 without long term use of insulin P:  Hold Home metformin. Reg ISS.  ? Diabetes mellitus without complication ([place])   ? Dyslipidemia 02/20/2020  ? Epilepsy ([place]) 02/06/2021  ? GAD (generalized anxiety disorder) 02/18/2020  ? Heartburn 04/15/2016  ? Last Assessment & Plan:  Formatting of this note might be different from the original. Patient was counseled regarding lifestyle modification  Advised to take Prilosec 30 mins before breakfast  Will schedule for EGD  ? History of atrial fibrillation 06/24/2017  ? History of posttraumatic stress disorder (PTSD) 10/07/2016  ? HLD (hyperlipidemia) 09/01/2014  ? Last Assessment & Plan:  Formatting of this note might be different from the original. A: Chronic. P: Continue statin.  ? Hyperglycemia due to type 2 diabetes mellitus ([place]) 02/06/2021  ? Hyperosmolarity due to secondary diabetes mellitus ([place]) 02/06/2021  ? Hypertriglyceridemia 02/18/2020  ? Impaired mobility and activities of daily living 11/14/2015  ?  Insomnia 02/06/2021  ? Irritable bowel syndrome (IBS)   ? Kidney stone 02/06/2021  ? Left elbow pain 11/14/2015  ? Migraine without aura, not refractory 02/06/2021  ? Migraines 10/11/2013  ? Last Assessment & Plan:  Formatting of this note might be different from the original. A: Chronic. P: Continue Topamax. (also takes it for bipolar)  ? Morbid obesity ([place]) 12/16/2016  ? Multiple sclerosis ([place]) 06/24/2017  ? Obesity (BMI 30-39.9) 09/01/2014  ? Last  Assessment & Plan:  Formatting of this note might be different from the original. A: Morbid obesity, with BMI of 35.0 - 39.9 P: Counseled to loose wt.  ? Paroxysmal atrial fibrillation ([place]) 04/14/2020  ? 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 Refractory migraine with aura 02/06/2021  ? Refractory migraine without aura 02/06/2021  ? Renal colic on left side 0/25/8527  ? Seizure disorder ([place]) 06/24/2017  ? Sleep apnea 02/06/2021  ? Sleep-disordered breathing 06/05/2018  ? Stage 3 chronic kidney disease ([place]) 02/20/2020  ? Unstable angina ([place]) 09/01/2014  ? Last Assessment & Plan:  Formatting of this note might be different from the original. A: Pt has 2 months exertional angina, recently developed angina at rest intermittently.  She had a positive nuclear medicine cardiolyte stress test with anteroapical ischemia on stress portion not seen at rest. P:  - continue aspirin, statin, metoprolol - Discussed stress test findings with cardiologist Dr. Landis Gandy  ? Urinary tract obstruction 02/06/2021  ? ? ?Past Surgical History:  ?Procedure Laterality Date  ? ABDOMINAL HYSTERECTOMY    ? Fibroids  ? ATRIAL FIBRILLATION ABLATION    ? CARDIAC CATHETERIZATION    ? CESAREAN SECTION    ? KNEE SURGERY    ? LEFT HEART CATH AND CORONARY ANGIOGRAPHY N/A 03/04/2020  ? Procedure: LEFT HEART CATH AND CORONARY ANGIOGRAPHY;  Surgeon: Belva Crome, MD;  Location: [place] [facility];  Service: Cardiovascular;  Laterality: N/A;  ? ROTATOR CUFF REPAIR    ? ? ? ?PHYSICAL EXAM:  ?VS: BP 90/62 (BP Location: Left Arm, Patient Position: Sitting)   Ht 5\' 6"  (1.676 m)   Wt 210 lb (95.3 kg)   BMI 33.89 kg/m?  ?Physical Exam ?Gen: NAD, alert, cooperative with exam, well-appearing ?MSK:  ?Neurovascularly intact   ? ? ? ? ?ASSESSMENT & PLAN:  ? ?Capsulitis of left shoulder ?Acutely occurring.  Has gotten improvement with the pain but still has more pain in external rotation to represent a capsulitis. ?-Counseled on home exercise therapy  and supportive care. ?-Could consider shockwave therapy or suprascapular nerve block. ? ? ? ? ?

## 2021-11-17 NOTE — Assessment & Plan Note (Signed)
Acutely occurring.  Has gotten improvement with the pain but still has more pain in external rotation to represent a capsulitis. ?-Counseled on home exercise therapy and supportive care. ?-Could consider shockwave therapy or suprascapular nerve block. ?

## 2021-11-18 DIAGNOSIS — N489 Disorder of penis, unspecified: Secondary | ICD-10-CM | POA: Insufficient documentation

## 2021-11-18 HISTORY — DX: Disorder of penis, unspecified: N48.9

## 2021-11-19 ENCOUNTER — Ambulatory Visit: Payer: Managed Care, Other (non HMO) | Admitting: Cardiology

## 2021-11-19 ENCOUNTER — Encounter: Payer: Self-pay | Admitting: Cardiology

## 2021-11-19 ENCOUNTER — Other Ambulatory Visit: Payer: Self-pay

## 2021-11-19 ENCOUNTER — Telehealth (HOSPITAL_COMMUNITY): Payer: Self-pay | Admitting: *Deleted

## 2021-11-19 VITALS — BP 96/64 | HR 54 | Ht 66.0 in | Wt 208.1 lb

## 2021-11-19 DIAGNOSIS — E669 Obesity, unspecified: Secondary | ICD-10-CM | POA: Diagnosis not present

## 2021-11-19 DIAGNOSIS — I471 Supraventricular tachycardia: Secondary | ICD-10-CM | POA: Diagnosis not present

## 2021-11-19 DIAGNOSIS — E782 Mixed hyperlipidemia: Secondary | ICD-10-CM | POA: Diagnosis not present

## 2021-11-19 DIAGNOSIS — I480 Paroxysmal atrial fibrillation: Secondary | ICD-10-CM | POA: Diagnosis not present

## 2021-11-19 NOTE — Telephone Encounter (Signed)
Left message on voicemail per DPR in reference to upcoming appointment scheduled on 11/24/2021 at 11:30 with detailed instructions given per Myocardial Perfusion Study Information Sheet for the test. LM to arrive 15 minutes early, and that it is imperative to arrive on time for appointment to keep from having the test rescheduled. If you need to cancel or reschedule your appointment, please call the office within 24 hours of your appointment. Failure to do so may result in a cancellation of your appointment, and a $50 no show fee. Phone number given for call back for any questions.  ? ?

## 2021-11-19 NOTE — Progress Notes (Signed)
?Cardiology Office Note:   ? ?Date:  11/19/2021  ? ?ID:  Paula Stout, DOB 09/02/1956, MRN [id number] ? ?PCP:  [name], DO  ?Cardiologist:  Jenean Lindau, MD  ? ?Referring MD: [name]*  ? ? ?ASSESSMENT:   ? ?1. Paroxysmal atrial fibrillation (HCC)   ?2. Paroxysmal supraventricular tachycardia ([place])   ?3. Mixed hyperlipidemia   ?4. Obesity (BMI 30-39.9)   ? ?PLAN:   ? ?In order of problems listed above: ? ?Primary prevention stressed with the patient.  Importance of compliance with diet medication stressed and she vocalized understanding. ?Essential hypertension: Blood pressure stable matter-of-fact it is borderline so discontinued Imdur.  I am not sure why Imdur was added to her regimen. ?Chest pain: She had this only when she had a tachyarrhythmia.  With activities of daily living she denies any chest pain but in view of risk factors we will do exercise stress Cardiolite.  She is agreeable. ?Mixed dyslipidemia: On statin therapy.  Lipids were reviewed and found to be fine. ?Diabetes mellitus: Hemoglobin A1c is within acceptable range and she is happy about it. ?Obesity: Weight reduction stressed and once her stress test is done and she is cleared she was advised a graded exercise program and she promises to do so.  I reviewed emergency room records. ?Patient will be seen in follow-up appointment in  months or earlier if the patient has any concerns ? ? ? ?Medication Adjustments/Labs and Tests Ordered: ?Current medicines are reviewed at length with the patient today.  Concerns regarding medicines are outlined above.  ?Orders Placed This Encounter  ?Procedures  ? MYOCARDIAL PERFUSION IMAGING  ? EKG 12-Lead  ? ?No orders of the defined types were placed in this encounter. ? ? ? ?No chief complaint on file. ?  ? ?History of Present Illness:   ? ?Paula Stout is a 66 y.o. female.  Patient has past medical history of paroxysmal atrial fibrillation, paroxysmal SVT, mixed dyslipidemia  and diabetes mellitus.  She denies any problems at this time and takes care of activities of daily living.  No chest pain orthopnea or PND.  At the time of my evaluation, the patient is alert awake oriented and in no distress.  ? ?Past Medical History:  ?Diagnosis Date  ? A-fib ([place])   ? ACS (acute coronary syndrome) ([place]) 07/31/2021  ? Acute cystitis without hematuria 05/14/2017  ? Last Assessment & Plan:  Formatting of this note might be different from the original. - suprapubic tenderness with symptoms of urinary frequency and urgency and subjective fevers (no fevers recorded on any visit). Recently passed a renal stone, US shows left hydronephrosis which is improving.  - UA negative8/24 but pt is clinically symptomatic - will repeat UA  - start pt on Microbid for 5 days  ? Adhesive capsulitis of left shoulder 12/16/2016  ? Anxiety   ? Asthma   ? Asthma without status asthmaticus 02/06/2021  ? At risk for falls 02/04/2019  ? Atrial fibrillation ([place]) 09/01/2014  ? Last Assessment & Plan:  Formatting of this note might be different from the original. A:  Chronic.  Sinus rhythm at this time.  States she was told this during admission at [facility] in the past. P:  On baby aspirin at home will continue.  Low dose metoprolol started.  ? Bipolar 1 disorder ([place]) 10/11/2013  ? Bipolar 1 disorder, depressed, moderate ([place]) 02/18/2020  ? Bipolar disorder ([place])   ? Capsulitis of left shoulder 10/20/2021  ?  Chest pain 02/19/2020  ? Chronic anticoagulation 02/20/2020  ? Chronic atrial fibrillation ([place]) 02/18/2020  ? Chronic headache 10/11/2013  ? Chronic interstitial cystitis 12/28/2005  ? Chronic migraine without aura, intractable, without status migrainosus 06/24/2017  ? Colon cancer screening 04/15/2016  ? Last Assessment & Plan:  Formatting of this note might be different from the original. Will schedule for colonoscopy  ? Depressed bipolar I disorder ([place]) 02/06/2021  ? Depression   ? Depression, recurrent ([place])  02/18/2020  ? Diabetes mellitus ([place])   ? Diabetes mellitus due to underlying condition with unspecified complications ([place]) 93/81/8299  ? Diabetes mellitus type 2 in obese ([place]) 10/11/2013  ? Last Assessment & Plan:  Formatting of this note might be different from the original. A:  Diabetes Mellitus Type 2 without long term use of insulin P:  Hold Home metformin. Reg ISS.  ? Diabetes mellitus without complication ([place])   ? Disorder of penis 11/18/2021  ? Dyslipidemia 02/20/2020  ? Dysmetabolic syndrome X 3/71/6967  ? Epilepsy ([place]) 02/06/2021  ? GAD (generalized anxiety disorder) 02/18/2020  ? GERD (gastroesophageal reflux disease) 11/05/2021  ? Heartburn 04/15/2016  ? Last Assessment & Plan:  Formatting of this note might be different from the original. Patient was counseled regarding lifestyle modification  Advised to take Prilosec 30 mins before breakfast  Will schedule for EGD  ? History of atrial fibrillation 06/24/2017  ? History of posttraumatic stress disorder (PTSD) 10/07/2016  ? HLD (hyperlipidemia) 09/01/2014  ? Last Assessment & Plan:  Formatting of this note might be different from the original. A: Chronic. P: Continue statin.  ? Hyperglycemia due to type 2 diabetes mellitus ([place]) 02/06/2021  ? Hyperosmolarity due to secondary diabetes mellitus ([place]) 02/06/2021  ? Hypertriglyceridemia 02/18/2020  ? Impaired mobility and activities of daily living 11/14/2015  ? Insomnia 02/06/2021  ? Irritable bowel syndrome (IBS)   ? 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 Kidney stone 02/06/2021  ? Migraine without aura, not refractory 02/06/2021  ? Migraines 10/11/2013  ? Last Assessment & Plan:  Formatting of this note might be different from the original. A: Chronic. P: Continue Topamax. (also takes it for bipolar)  ? Mixed hyperlipidemia 04/06/2021  ? Last Assessment & Plan:  Formatting of this note might be different from the original. Due for labs with PCP in November  ? Morbid obesity ([place]) 12/16/2016  ? Multiple  sclerosis ([place]) 06/24/2017  ? Obesity (BMI 30-39.9) 09/01/2014  ? Last Assessment & Plan:  Formatting of this note might be different from the original. A: Morbid obesity, with BMI of 35.0 - 39.9 P: Counseled to loose wt.  ? Paroxysmal atrial fibrillation ([place]) 04/14/2020  ? Paroxysmal supraventricular tachycardia ([place]) 11/05/2021  ? Pneumonia, organism unspecified([place]) 11/21/2002  ? 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 Primary hypertension 04/06/2021  ? Last Assessment & Plan:  Formatting of this note might be different from the original. Controlled  ? Refractory migraine with aura 02/06/2021  ? Refractory migraine without aura 02/06/2021  ? Renal colic on left side 89/38/1017  ? Seizure disorder ([place]) 06/24/2017  ? Sleep apnea 02/06/2021  ? Sleep-disordered breathing 06/05/2018  ? Stage 3 chronic kidney disease ([place]) 02/20/2020  ? Unspecified adverse effect of other drug, medicinal and biological substance(995.29) 02/17/2006  ? Unstable angina ([place]) 09/01/2014  ? Last Assessment & Plan:  Formatting of this note might be different from the original. A: Pt has 2 months exertional angina, recently developed angina at rest intermittently.  She had a positive nuclear medicine cardiolyte stress test with anteroapical ischemia on stress portion not seen at rest. P:  - continue aspirin, statin, metoprolol - Discussed stress test findings with cardiologist Dr. Landis Gandy  ? Urinary tract obstruction 02/06/2021  ? ? ?Past Surgical History:  ?Procedure Laterality Date  ? ABDOMINAL HYSTERECTOMY    ? Fibroids  ? ATRIAL FIBRILLATION ABLATION    ? CARDIAC CATHETERIZATION    ? CESAREAN SECTION    ? KNEE SURGERY    ? LEFT HEART CATH AND CORONARY ANGIOGRAPHY N/A 03/04/2020  ? Procedure: LEFT HEART CATH AND CORONARY ANGIOGRAPHY;  Surgeon: Belva Crome, MD;  Location: [facility];  Service: Cardiovascular;  Laterality: N/A;  ? ROTATOR CUFF REPAIR    ? ? ?Current Medications: ?Current Meds  ?Medication Sig  ?  acetaminophen (TYLENOL) 500 MG tablet Take 1,000 mg by mouth every 6 (six) hours as needed for mild pain.  ? albuterol (VENTOLIN HFA) 108 (90 Base) MCG/ACT inhaler Inhale 2 puffs into the lungs every 4 (four)

## 2021-11-19 NOTE — Patient Instructions (Addendum)
Medication Instructions:  ?Your physician has recommended you make the following change in your medication:  ? ?Stop Isosorbide (Imdur) ? ?*If you need a refill on your cardiac medications before your next appointment, please call your pharmacy* ? ? ?Lab Work: ?None ordered ?If you have labs (blood work) drawn today and your tests are completely normal, you will receive your results only by: ?MyChart Message (if you have MyChart) OR ?A paper copy in the mail ?If you have any lab test that is abnormal or we need to change your treatment, we will call you to review the results. ? ? ?Testing/Procedures: ?Your physician has requested that you have a lexiscan myoview. For further information please visit [email or web address]. Please follow instruction sheet, as given. ? ?The test will take approximately 3 to 4 hours to complete; you may bring reading material.  If someone comes with you to your appointment, they will need to remain in the main lobby due to limited space in the testing area.  ? ?How to prepare for your Myocardial Perfusion Test: ?Do not eat or drink 3 hours prior to your test, except you may have water. ?Do not consume products containing caffeine (regular or decaffeinated) 12 hours prior to your test. (ex: coffee, chocolate, sodas, tea). ?Do bring a list of your current medications with you.  If not listed below, you may take your medications as normal. ?Do wear comfortable clothes (no dresses or overalls) and walking shoes, tennis shoes preferred (No heels or open toe shoes are allowed). ?Do NOT wear cologne, perfume, aftershave, or lotions (deodorant is allowed). ?If these instructions are not followed, your test will have to be rescheduled. ? ? ? ?Follow-Up: ?At [facility], you and your health needs are our priority.  As part of our continuing mission to provide you with exceptional heart care, we have created designated Provider Care Teams.  These Care Teams include your primary Cardiologist  (physician) and Advanced Practice Providers (APPs -  Physician Assistants and Nurse Practitioners) who all work together to provide you with the care you need, when you need it. ? ?We recommend signing up for the patient portal called "MyChart".  Sign up information is provided on this After Visit Summary.  MyChart is used to connect with patients for Virtual Visits (Telemedicine).  Patients are able to view lab/test results, encounter notes, upcoming appointments, etc.  Non-urgent messages can be sent to your provider as well.   ?To learn more about what you can do with MyChart, go to [email or web address].   ? ?Your next appointment:   ?2 month(s) ? ?The format for your next appointment:   ?In Person ? ?Provider:   ?Jyl Heinz, MD ? ? ?Other Instructions ?Cardiac Nuclear Scan ?A cardiac nuclear scan is a test that is done to check the flow of blood to your heart. It is done when you are resting and when you are exercising. The test looks for problems such as: ?Not enough blood reaching a portion of the heart. ?The heart muscle not working as it should. ?You may need this test if: ?You have heart disease. ?You have had lab results that are not normal. ?You have had heart surgery or a balloon procedure to open up blocked arteries (angioplasty). ?You have chest pain. ?You have shortness of breath. ?In this test, a special dye (tracer) is put into your bloodstream. The tracer will travel to your heart. A camera will then take pictures of your heart to see how the tracer moves through your  heart. This test is usually done at a hospital and takes 2-4 hours. ?Tell a doctor about: ?Any allergies you have. ?All medicines you are taking, including vitamins, herbs, eye drops, creams, and over-the-counter medicines. ?Any problems you or family members have had with anesthetic medicines. ?Any blood disorders you have. ?Any surgeries you have had. ?Any medical conditions you have. ?Whether you are pregnant or may be  pregnant. ?What are the risks? ?Generally, this is a safe test. However, problems may occur, such as: ?Serious chest pain and heart attack. This is only a risk if the stress portion of the test is done. ?Rapid heartbeat. ?A feeling of warmth in your chest. This feeling usually does not last long. ?Allergic reaction to the tracer. ?What happens before the test? ?Ask your doctor about changing or stopping your normal medicines. This is important. ?Follow instructions from your doctor about what you cannot eat or drink. ?Remove your jewelry on the day of the test. ?What happens during the test? ?An IV tube will be inserted into one of your veins. ?Your doctor will give you a small amount of tracer through the IV tube. ?You will wait for 20-40 minutes while the tracer moves through your bloodstream. ?Your heart will be monitored with an electrocardiogram (ECG). ?You will lie down on an exam table. ?Pictures of your heart will be taken for about 15-20 minutes. ?You may also have a stress test. For this test, one of these things may be done: ?You will be asked to exercise on a treadmill or a stationary bike. ?You will be given medicines that will make your heart work harder. This is done if you are unable to exercise. ?When blood flow to your heart has peaked, a tracer will again be given through the IV tube. ?After 20-40 minutes, you will get back on the exam table. More pictures will be taken of your heart. ?Depending on the tracer that is used, more pictures may need to be taken 3-4 hours later. ?Your IV tube will be removed when the test is over. ?The test may vary among doctors and hospitals. ?What happens after the test? ?Ask your doctor: ?Whether you can return to your normal schedule, including diet, activities, and medicines. ?Whether you should drink more fluids. This will help to remove the tracer from your body. Drink enough fluid to keep your pee (urine) pale yellow. ?Ask your doctor, or the department that  is doing the test: ?When will my results be ready? ?How will I get my results? ?Summary ?A cardiac nuclear scan is a test that is done to check the flow of blood to your heart. ?Tell your doctor whether you are pregnant or may be pregnant. ?Before the test, ask your doctor about changing or stopping your normal medicines. This is important. ?Ask your doctor whether you can return to your normal activities. You may be asked to drink more fluids. ?This information is not intended to replace advice given to you by your health care provider. Make sure you discuss any questions you have with your health care provider. ?Document Revised: 12/13/2018 Document Reviewed: 02/06/2018 ?Elsevier Patient Education ? [place]. ? ? ? ?

## 2021-11-20 ENCOUNTER — Other Ambulatory Visit: Payer: Self-pay | Admitting: Family Medicine

## 2021-11-20 ENCOUNTER — Telehealth: Payer: Self-pay | Admitting: Cardiology

## 2021-11-20 ENCOUNTER — Encounter: Payer: Self-pay | Admitting: Family Medicine

## 2021-11-20 DIAGNOSIS — F319 Bipolar disorder, unspecified: Secondary | ICD-10-CM

## 2021-11-20 DIAGNOSIS — F339 Major depressive disorder, recurrent, unspecified: Secondary | ICD-10-CM

## 2021-11-20 NOTE — Addendum Note (Signed)
Addended by: Sharon Seller B on: 11/20/2021 04:03 PM ? ? Modules accepted: Orders ? ?

## 2021-11-20 NOTE — Telephone Encounter (Signed)
Patient is requesting to switch providers from Dr. Geraldo Pitter to either Dr. Bettina Gavia or Dr. Agustin Cree. If okay to switch, would either of you be willing to take on this patient?  ? ?Please advise as able, ?Thank you ?

## 2021-11-25 ENCOUNTER — Other Ambulatory Visit: Payer: Self-pay

## 2021-11-25 MED ORDER — TRULICITY 1.5 MG/0.5ML ~~LOC~~ SOAJ: 1.5000 mg | SUBCUTANEOUS | 2 refills | Status: DC

## 2021-11-27 ENCOUNTER — Ambulatory Visit: Payer: Managed Care, Other (non HMO) | Admitting: Cardiology

## 2021-11-27 ENCOUNTER — Other Ambulatory Visit: Payer: Self-pay

## 2021-11-27 ENCOUNTER — Encounter: Payer: Self-pay | Admitting: Cardiology

## 2021-11-27 VITALS — BP 102/70 | HR 75 | Ht 66.0 in | Wt 210.0 lb

## 2021-11-27 DIAGNOSIS — E088 Diabetes mellitus due to underlying condition with unspecified complications: Secondary | ICD-10-CM | POA: Diagnosis not present

## 2021-11-27 DIAGNOSIS — R079 Chest pain, unspecified: Secondary | ICD-10-CM

## 2021-11-27 DIAGNOSIS — I480 Paroxysmal atrial fibrillation: Secondary | ICD-10-CM | POA: Diagnosis not present

## 2021-11-27 DIAGNOSIS — I471 Supraventricular tachycardia, unspecified: Secondary | ICD-10-CM

## 2021-11-27 DIAGNOSIS — G4730 Sleep apnea, unspecified: Secondary | ICD-10-CM | POA: Diagnosis not present

## 2021-11-27 DIAGNOSIS — F319 Bipolar disorder, unspecified: Secondary | ICD-10-CM

## 2021-11-27 MED ORDER — NITROGLYCERIN 0.4 MG SL SUBL: 0.4000 mg | SUBLINGUAL_TABLET | SUBLINGUAL | 11 refills | Status: DC | PRN

## 2021-11-27 NOTE — Patient Instructions (Addendum)
Medication Instructions:  ?Your physician recommends that you continue on your current medications as directed. Please refer to the Current Medication list given to you today. ? ? ?*If you need a refill on your cardiac medications before your next appointment, please call your pharmacy* ? ? ?Lab Work: ?None ?If you have labs (blood work) drawn today and your tests are completely normal, you will receive your results only by: ?MyChart Message (if you have MyChart) OR ?A paper copy in the mail ?If you have any lab test that is abnormal or we need to change your treatment, we will call you to review the results. ? ? ?Testing/Procedures: ?Your physician has requested that you have an echocardiogram. Echocardiography is a painless test that uses sound waves to create images of your heart. It provides your doctor with information about the size and shape of your heart and how well your heart?s chambers and valves are working. This procedure takes approximately one hour. There are no restrictions for this procedure. ? ? ? ?[facility] ?[address] ?[place], [place] [address] ?Phone: [phone] ? ? ? ?Please arrive 15 minutes prior to your appointment time for registration and insurance purposes. ? ?The test will take approximately 3 to 4 hours to complete; you may bring reading material.  If someone comes with you to your appointment, they will need to remain in the main lobby due to limited space in the testing area. **If you are pregnant or breastfeeding, please notify the nuclear lab prior to your appointment** ? ?How to prepare for your Myocardial Perfusion Test: ?Do not eat or drink 3 hours prior to your test, except you may have water. ?Do not consume products containing caffeine (regular or decaffeinated) 12 hours prior to your test. (ex: coffee, chocolate, sodas, tea). ?Do bring a list of your current medications with you.  If not listed below, you may  take your medications as normal. ?HOLD diabetic medication/insulin the morning of the test: Metformin  ?Do wear comfortable clothes (no dresses or overalls) and walking shoes, tennis shoes preferred (No heels or open toe shoes are allowed). ?Do NOT wear cologne, perfume, aftershave, or lotions (deodorant is allowed). ?If these instructions are not followed, your test will have to be rescheduled. ? ?Please report to [address] for your test.  If you have questions or concerns about your appointment, you can call the Nuclear Lab at [phone]. ? ?If you cannot keep your appointment, please provide 24 hours notification to the Nuclear Lab, to avoid a possible $50 charge to your account. ? ? ? ?Follow-Up: ?At [facility], you and your health needs are our priority.  As part of our continuing mission to provide you with exceptional heart care, we have created designated Provider Care Teams.  These Care Teams include your primary Cardiologist (physician) and Advanced Practice Providers (APPs -  Physician Assistants and Nurse Practitioners) who all work together to provide you with the care you need, when you need it. ? ?We recommend signing up for the patient portal called "MyChart".  Sign up information is provided on this After Visit Summary.  MyChart is used to connect with patients for Virtual Visits (Telemedicine).  Patients are able to view lab/test results, encounter notes, upcoming appointments, etc.  Non-urgent messages can be sent to your provider as well.   ?To learn more about what you can do with MyChart, go to [email or web address].   ? ?Your next appointment:   ?  2 month(s) ? ?The format for your next appointment:   ?In Person ? ?Provider:   ?Jenne Campus, MD  ? ? ?Other Instructions ?Cut the atenolol dose in half. Take half in the morning and half in the evening ? ?Refilled Nitro. ? ?

## 2021-11-27 NOTE — Progress Notes (Signed)
?Cardiology Office Note:   ? ?Date:  11/27/2021  ? ?ID:  Paula Stout, DOB 1956-08-25, MRN [id number] ? ?PCP:  Paula Pal, DO  ?Cardiologist:  Paula Campus, MD   ? ?Referring MD: Paula Stout*  ? ?Chief Complaint  ?Patient presents with  ? Chest Pain  ? ? ?History of Present Illness:   ? ?Paula Stout is a 66 y.o. female past medical history significant for paroxysmal atrial fibrillation, status post atrial fibrillation ablation done many years ago, she is anticoagulated, bronchial asthma, essential hypertension, bipolar disorder, migraine, type 2 diabetes, history of seizures, dyslipidemia, supraventricular tachycardia.  Recently she has been complaining of having repeated episodes of palpitations.  Eventually at the beginning of March she end up in the emergency room she was in look like SVT probably AV nodal reentry and she was converted with adenosine.  When she had this episode she complained of having chest pain she also have chest pain when she exercise.  She did have ischemia work-up done already.  In June 2021 she had a cardiac catheterization basically so normal coronaries then in February she end up having stress test which showed no evidence of ischemia.  In spite of that she still experiencing chest pain. ? ?Past Medical History:  ?Diagnosis Date  ? A-fib ([place])   ? ACS (acute coronary syndrome) ([place]) 07/31/2021  ? Acute cystitis without hematuria 05/14/2017  ? Last Assessment & Plan:  Formatting of this note might be different from the original. - suprapubic tenderness with symptoms of urinary frequency and urgency and subjective fevers (no fevers recorded on any visit). Recently passed a renal stone, US shows left hydronephrosis which is improving.  - UA negative8/24 but pt is clinically symptomatic - will repeat UA  - start pt on Microbid for 5 days  ? Adhesive capsulitis of left shoulder 12/16/2016  ? Anxiety   ? Asthma   ? Asthma without status asthmaticus 02/06/2021   ? At risk for falls 02/04/2019  ? Atrial fibrillation ([place]) 09/01/2014  ? Last Assessment & Plan:  Formatting of this note might be different from the original. A:  Chronic.  Sinus rhythm at this time.  States she was told this during admission at [facility] in the past. P:  On baby aspirin at home will continue.  Low dose metoprolol started.  ? Bipolar 1 disorder ([place]) 10/11/2013  ? Bipolar 1 disorder, depressed, moderate ([place]) 02/18/2020  ? Bipolar disorder ([place])   ? Capsulitis of left shoulder 10/20/2021  ? Chest pain 02/19/2020  ? Chronic anticoagulation 02/20/2020  ? Chronic atrial fibrillation ([place]) 02/18/2020  ? Chronic headache 10/11/2013  ? Chronic interstitial cystitis 12/28/2005  ? Chronic migraine without aura, intractable, without status migrainosus 06/24/2017  ? Colon cancer screening 04/15/2016  ? Last Assessment & Plan:  Formatting of this note might be different from the original. Will schedule for colonoscopy  ? Depressed bipolar I disorder ([place]) 02/06/2021  ? Depression   ? Depression, recurrent ([place]) 02/18/2020  ? Diabetes mellitus ([place])   ? Diabetes mellitus due to underlying condition with unspecified complications ([place]) 56/38/7564  ? Diabetes mellitus type 2 in obese ([place]) 10/11/2013  ? Last Assessment & Plan:  Formatting of this note might be different from the original. A:  Diabetes Mellitus Type 2 without long term use of insulin P:  Hold Home metformin. Reg ISS.  ? Diabetes mellitus without complication ([place])   ? Disorder of penis 11/18/2021  ? Dyslipidemia 02/20/2020  ? Dysmetabolic  syndrome X 03/20/2008  ? Epilepsy ([place]) 02/06/2021  ? GAD (generalized anxiety disorder) 02/18/2020  ? GERD (gastroesophageal reflux disease) 11/05/2021  ? Heartburn 04/15/2016  ? Last Assessment & Plan:  Formatting of this note might be different from the original. Patient was counseled regarding lifestyle modification  Advised to take Prilosec 30 mins before breakfast  Will schedule for EGD  ? History of atrial  fibrillation 06/24/2017  ? History of posttraumatic stress disorder (PTSD) 10/07/2016  ? HLD (hyperlipidemia) 09/01/2014  ? Last Assessment & Plan:  Formatting of this note might be different from the original. A: Chronic. P: Continue statin.  ? Hyperglycemia due to type 2 diabetes mellitus ([place]) 02/06/2021  ? Hyperosmolarity due to secondary diabetes mellitus ([place]) 02/06/2021  ? Hypertriglyceridemia 02/18/2020  ? Impaired mobility and activities of daily living 11/14/2015  ? Insomnia 02/06/2021  ? Irritable bowel syndrome (IBS)   ? Junctional tachycardia ([place]) 11/05/2021  ? Kidney stone 02/06/2021  ? Migraine without aura, not refractory 02/06/2021  ? Migraines 10/11/2013  ? Last Assessment & Plan:  Formatting of this note might be different from the original. A: Chronic. P: Continue Topamax. (also takes it for bipolar)  ? Mixed hyperlipidemia 04/06/2021  ? Last Assessment & Plan:  Formatting of this note might be different from the original. Due for labs with PCP in November  ? Morbid obesity ([place]) 12/16/2016  ? Multiple sclerosis ([place]) 06/24/2017  ? Obesity (BMI 30-39.9) 09/01/2014  ? Last Assessment & Plan:  Formatting of this note might be different from the original. A: Morbid obesity, with BMI of 35.0 - 39.9 P: Counseled to loose wt.  ? Paroxysmal atrial fibrillation ([place]) 04/14/2020  ? Paroxysmal supraventricular tachycardia ([place]) 11/05/2021  ? Pneumonia, organism unspecified(486) 11/21/2002  ? 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 Primary hypertension 04/06/2021  ? Last Assessment & Plan:  Formatting of this note might be different from the original. Controlled  ? Refractory migraine with aura 02/06/2021  ? Refractory migraine without aura 02/06/2021  ? Renal colic on left side 09/60/4540  ? Seizure disorder ([place]) 06/24/2017  ? Sleep apnea 02/06/2021  ? Sleep-disordered breathing 06/05/2018  ? Stage 3 chronic kidney disease ([place]) 02/20/2020  ? Unspecified adverse effect of other drug,  medicinal and biological substance(995.29) 02/17/2006  ? Unstable angina ([place]) 09/01/2014  ? Last Assessment & Plan:  Formatting of this note might be different from the original. A: Pt has 2 months exertional angina, recently developed angina at rest intermittently.  She had a positive nuclear medicine cardiolyte stress test with anteroapical ischemia on stress portion not seen at rest. P:  - continue aspirin, statin, metoprolol - Discussed stress test findings with cardiologist Dr. Landis Gandy  ? Urinary tract obstruction 02/06/2021  ? ? ?Past Surgical History:  ?Procedure Laterality Date  ? ABDOMINAL HYSTERECTOMY    ? Fibroids  ? ATRIAL FIBRILLATION ABLATION    ? CARDIAC CATHETERIZATION    ? CARDIOVERSION    ? CESAREAN SECTION    ? KNEE SURGERY    ? LEFT HEART CATH AND CORONARY ANGIOGRAPHY N/A 03/04/2020  ? Procedure: LEFT HEART CATH AND CORONARY ANGIOGRAPHY;  Surgeon: Belva Crome, MD;  Location: [place] [facility];  Service: Cardiovascular;  Laterality: N/A;  ? ROTATOR CUFF REPAIR    ? ? ?Current Medications: ?Current Meds  ?Medication Sig  ? acetaminophen (TYLENOL) 500 MG tablet Take 1,000 mg by mouth every 6 (six) hours as needed for mild pain.  ?  albuterol (VENTOLIN HFA) 108 (90 Base) MCG/ACT inhaler Inhale 2 puffs into the lungs every 4 (four) hours as needed for wheezing.  ? apixaban (ELIQUIS) 5 MG TABS tablet Take 1 tablet (5 mg total) by mouth 2 (two) times daily.  ? atenolol (TENORMIN) 50 MG tablet Take 1 tablet (50 mg total) by mouth daily.  ? atorvastatin (LIPITOR) 80 MG tablet Take 1 tablet (80 mg total) by mouth daily.  ? B Complex Vitamins (B COMPLEX PO) Take 1 tablet by mouth daily.  ? blood glucose meter kit and supplies One Touch Ultra  Check 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 One Touch Ultra  Check blood sugars once daily Dx: E11.9)  ? buPROPion (WELLBUTRIN XL) 300 MG 24 hr tablet Take 1 tablet (300 mg total) by mouth daily.  ?  EPINEPHrine 0.3 mg/0.3 mL IJ SOAJ injection Inject 0.3 mg into the muscle as needed for anaphylaxis.  ? ezetimibe (ZETIA) 10 MG tablet Take 10 mg by mouth daily.  ? fenofibrate 160 MG tablet Take 1 tablet

## 2021-11-28 ENCOUNTER — Encounter: Payer: Self-pay | Admitting: Family Medicine

## 2021-11-30 ENCOUNTER — Encounter: Payer: Self-pay | Admitting: Family Medicine

## 2021-11-30 DIAGNOSIS — F319 Bipolar disorder, unspecified: Secondary | ICD-10-CM

## 2021-11-30 MED ORDER — LURASIDONE HCL 120 MG PO TABS
120.0000 mg | ORAL_TABLET | Freq: Every evening | ORAL | 2 refills | Status: DC
Start: 2021-11-30 — End: 2022-07-21

## 2021-11-30 MED ORDER — QUETIAPINE FUMARATE 300 MG PO TABS: 300.0000 mg | ORAL_TABLET | Freq: Every day | ORAL | 2 refills | Status: DC

## 2021-11-30 MED ORDER — TRULICITY 1.5 MG/0.5ML ~~LOC~~ SOAJ: 1.5000 mg | SUBCUTANEOUS | 1 refills | Status: DC

## 2021-12-01 ENCOUNTER — Telehealth (HOSPITAL_COMMUNITY): Payer: Self-pay

## 2021-12-01 NOTE — Telephone Encounter (Signed)
Spoke with the patient, detailed instructions given. She stated that she would be here for her test. Asked to call back with any questions. S.Jalon Blackwelder EMTP 

## 2021-12-03 ENCOUNTER — Ambulatory Visit (HOSPITAL_BASED_OUTPATIENT_CLINIC_OR_DEPARTMENT_OTHER): Admission: RE | Admit: 2021-12-03 | Payer: Commercial Managed Care - HMO | Source: Ambulatory Visit

## 2021-12-03 ENCOUNTER — Ambulatory Visit (HOSPITAL_BASED_OUTPATIENT_CLINIC_OR_DEPARTMENT_OTHER): Payer: Commercial Managed Care - HMO | Attending: Cardiology

## 2021-12-03 DIAGNOSIS — R079 Chest pain, unspecified: Secondary | ICD-10-CM | POA: Insufficient documentation

## 2021-12-03 LAB — MYOCARDIAL PERFUSION IMAGING
LV dias vol: 79 mL (ref 46–106)
LV sys vol: 30 mL
Nuc Stress EF: 61 %
Peak HR: 80 {beats}/min
Rest HR: 63 {beats}/min
Rest Nuclear Isotope Dose: 10.5 mCi
SDS: 1
SRS: 0
SSS: 1
Stress Nuclear Isotope Dose: 32.5 mCi
TID: 0.87

## 2021-12-03 MED ORDER — TECHNETIUM TC 99M TETROFOSMIN IV KIT
10.5000 | PACK | Freq: Once | INTRAVENOUS | Status: AC | PRN
  Administered 2021-12-03: 10.5 via INTRAVENOUS
  Filled 2021-12-03: qty 11

## 2021-12-03 MED ORDER — REGADENOSON 0.4 MG/5ML IV SOLN
0.4000 mg | Freq: Once | INTRAVENOUS | Status: AC
  Administered 2021-12-03: 0.4 mg via INTRAVENOUS

## 2021-12-03 MED ORDER — TECHNETIUM TC 99M TETROFOSMIN IV KIT
32.5000 | PACK | Freq: Once | INTRAVENOUS | Status: AC | PRN
  Administered 2021-12-03: 32.5 via INTRAVENOUS
  Filled 2021-12-03: qty 33

## 2021-12-07 ENCOUNTER — Other Ambulatory Visit: Payer: Self-pay | Admitting: Family Medicine

## 2021-12-07 DIAGNOSIS — R112 Nausea with vomiting, unspecified: Secondary | ICD-10-CM

## 2021-12-07 DIAGNOSIS — I471 Supraventricular tachycardia: Secondary | ICD-10-CM

## 2021-12-07 MED ORDER — APIXABAN 5 MG PO TABS: 5.0000 mg | ORAL_TABLET | Freq: Two times a day (BID) | ORAL | 1 refills | Status: DC

## 2021-12-07 MED ORDER — ATORVASTATIN CALCIUM 80 MG PO TABS: 80.0000 mg | ORAL_TABLET | Freq: Every day | ORAL | 3 refills | Status: DC

## 2021-12-07 MED ORDER — PANTOPRAZOLE SODIUM 40 MG PO TBEC: 40.0000 mg | DELAYED_RELEASE_TABLET | Freq: Two times a day (BID) | ORAL | 1 refills | Status: DC

## 2021-12-07 MED ORDER — ATENOLOL 50 MG PO TABS: 50.0000 mg | ORAL_TABLET | Freq: Every day | ORAL | 3 refills | Status: DC

## 2021-12-07 NOTE — Progress Notes (Signed)
? ?Cardiology Office Note ?Date:  12/07/2021  ?Patient ID:  Paula Stout, Paula Stout 1956-04-23, MRN [id number] ?PCP:  [name], DO  ?Cardiologist:  Dr. Geraldo Pitter >> Dr. Agustin Cree ?Electrophysiologist: Dr. Curt Bears ? ?  ?Chief Complaint:  post hospital ? ?History of Present Illness: ?Paula Stout is a 66 y.o. female with history of DM, HLD, CKD (III), SVT, AFib (has hx of a CTI and PVI ablation Jan 2020 in [place]), obesity, Bipolar d/o, migraine HAs, HTN ? ? ?June 2021 went to [facility] with palpitations and chest pain.  She was found to be in sinus rhythm on presentation.  Catheterization showed no evidence of coronary artery disease ? ?She comes in today to be seen for Dr. Curt Bears, last seen by him May 2022, she continued to have CP, was taking s/l NTG with some improvement in her symptoms.  Occurred at rest and exertion, every day ?Had a neg stress myoview March 2022 ?Did not think was coronary driven CP, though with some improvement with the NTG started on low dose Imdur ? ?She saw Dr. Agustin Cree 11/27/21, had a recent ER visit (11/06/21)with an SVT that converted with adenosine, associated with CP. ? ?HS Trop were 5, 25, 73 ?Discussed having CP episodes outside of the SVT as well.  ?Recommended that she cut her atenolol in 1/2 and take 1/2 tab in the AM and 1/2 tab PM. ?CP sounded suspicious though as above noted no CAD in 2021, and neg stress test in 2022. ?Given her flecainide, planned to repeat stress test, unable to pursue CTa 2/2 CKD (baseline 1.6-1.9) ? ?Stress test was normal, low risk ? ?TODAY ?She continues to have palpitations/CP ?They are not always connected, she can have CP/SOB without tachycardia, but everytime she has tachycardia she has CP/SOB ?Since her last ER visit and adenosine, seems she was changed from metoprolol to atenolol and so far has not had another SVT, she reports her PMD made that adjustment ?She felt the the isosorbide did help her CP, but Dr. Geraldo Pitter stopped it. ?She has  not had syncope ?She has not had GI eval ? ?Her problem list is lengthy and much of it she reports an inaccurate, we went through this and removed: ?Seizure d/o/epilepsy, she reports never had has either ?Removed multiple duplicates ?Removed sleep apnea, she reports tested twice and not found to have apnea. ?She denies GERD/heartburn, has not had a GI evaluation  ? ?AAD Hx ?Flecainide started Feb 2022 ? ? ? ?Past Medical History:  ?Diagnosis Date  ? A-fib ([place])   ? ACS (acute coronary syndrome) ([place]) 07/31/2021  ? Acute cystitis without hematuria 05/14/2017  ? Last Assessment & Plan:  Formatting of this note might be different from the original. - suprapubic tenderness with symptoms of urinary frequency and urgency and subjective fevers (no fevers recorded on any visit). Recently passed a renal stone, US shows left hydronephrosis which is improving.  - UA negative8/24 but pt is clinically symptomatic - will repeat UA  - start pt on Microbid for 5 days  ? Adhesive capsulitis of left shoulder 12/16/2016  ? Anxiety   ? Asthma   ? Asthma without status asthmaticus 02/06/2021  ? At risk for falls 02/04/2019  ? Atrial fibrillation ([place]) 09/01/2014  ? Last Assessment & Plan:  Formatting of this note might be different from the original. A:  Chronic.  Sinus rhythm at this time.  States she was told this during admission at [facility] in the past. P:  On baby aspirin  at home will continue.  Low dose metoprolol started.  ? Bipolar 1 disorder ([place]) 10/11/2013  ? Bipolar 1 disorder, depressed, moderate ([place]) 02/18/2020  ? Bipolar disorder ([place])   ? Capsulitis of left shoulder 10/20/2021  ? Chest pain 02/19/2020  ? Chronic anticoagulation 02/20/2020  ? Chronic atrial fibrillation ([place]) 02/18/2020  ? Chronic headache 10/11/2013  ? Chronic interstitial cystitis 12/28/2005  ? Chronic migraine without aura, intractable, without status migrainosus 06/24/2017  ? Colon cancer screening 04/15/2016  ? Last Assessment & Plan:  Formatting of  this note might be different from the original. Will schedule for colonoscopy  ? Depressed bipolar I disorder ([place]) 02/06/2021  ? Depression   ? Depression, recurrent ([place]) 02/18/2020  ? Diabetes mellitus ([place])   ? Diabetes mellitus due to underlying condition with unspecified complications ([place]) 16/06/9603  ? Diabetes mellitus type 2 in obese ([place]) 10/11/2013  ? Last Assessment & Plan:  Formatting of this note might be different from the original. A:  Diabetes Mellitus Type 2 without long term use of insulin P:  Hold Home metformin. Reg ISS.  ? Diabetes mellitus without complication ([place])   ? Disorder of penis 11/18/2021  ? Dyslipidemia 02/20/2020  ? Dysmetabolic syndrome X 5/40/9811  ? Epilepsy ([place]) 02/06/2021  ? GAD (generalized anxiety disorder) 02/18/2020  ? GERD (gastroesophageal reflux disease) 11/05/2021  ? Heartburn 04/15/2016  ? Last Assessment & Plan:  Formatting of this note might be different from the original. Patient was counseled regarding lifestyle modification  Advised to take Prilosec 30 mins before breakfast  Will schedule for EGD  ? History of atrial fibrillation 06/24/2017  ? History of posttraumatic stress disorder (PTSD) 10/07/2016  ? HLD (hyperlipidemia) 09/01/2014  ? Last Assessment & Plan:  Formatting of this note might be different from the original. A: Chronic. P: Continue statin.  ? Hyperglycemia due to type 2 diabetes mellitus ([place]) 02/06/2021  ? Hyperosmolarity due to secondary diabetes mellitus ([place]) 02/06/2021  ? Hypertriglyceridemia 02/18/2020  ? Impaired mobility and activities of daily living 11/14/2015  ? Insomnia 02/06/2021  ? Irritable bowel syndrome (IBS)   ? Junctional tachycardia ([place]) 11/05/2021  ? Kidney stone 02/06/2021  ? Migraine without aura, not refractory 02/06/2021  ? Migraines 10/11/2013  ? Last Assessment & Plan:  Formatting of this note might be different from the original. A: Chronic. P: Continue Topamax. (also takes it for bipolar)  ? Mixed hyperlipidemia 04/06/2021   ? Last Assessment & Plan:  Formatting of this note might be different from the original. Due for labs with PCP in November  ? Morbid obesity ([place]) 12/16/2016  ? Multiple sclerosis ([place]) 06/24/2017  ? Obesity (BMI 30-39.9) 09/01/2014  ? Last Assessment & Plan:  Formatting of this note might be different from the original. A: Morbid obesity, with BMI of 35.0 - 39.9 P: Counseled to loose wt.  ? Paroxysmal atrial fibrillation ([place]) 04/14/2020  ? Paroxysmal supraventricular tachycardia ([place]) 11/05/2021  ? Pneumonia, organism unspecified(486) 11/21/2002  ? 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 Primary hypertension 04/06/2021  ? Last Assessment & Plan:  Formatting of this note might be different from the original. Controlled  ? Refractory migraine with aura 02/06/2021  ? Refractory migraine without aura 02/06/2021  ? Renal colic on left side 91/47/8295  ? Seizure disorder ([place]) 06/24/2017  ? Sleep apnea 02/06/2021  ? Sleep-disordered breathing 06/05/2018  ? Stage 3 chronic kidney disease ([place]) 02/20/2020  ? Unspecified adverse effect of other drug, medicinal  and biological substance(995.29) 02/17/2006  ? Unstable angina ([place]) 09/01/2014  ? Last Assessment & Plan:  Formatting of this note might be different from the original. A: Pt has 2 months exertional angina, recently developed angina at rest intermittently.  She had a positive nuclear medicine cardiolyte stress test with anteroapical ischemia on stress portion not seen at rest. P:  - continue aspirin, statin, metoprolol - Discussed stress test findings with cardiologist Dr. Landis Gandy  ? Urinary tract obstruction 02/06/2021  ? ? ?Past Surgical History:  ?Procedure Laterality Date  ? ABDOMINAL HYSTERECTOMY    ? Fibroids  ? ATRIAL FIBRILLATION ABLATION    ? CARDIAC CATHETERIZATION    ? CARDIOVERSION    ? CESAREAN SECTION    ? KNEE SURGERY    ? LEFT HEART CATH AND CORONARY ANGIOGRAPHY N/A 03/04/2020  ? Procedure: LEFT HEART CATH AND CORONARY ANGIOGRAPHY;   Surgeon: Belva Crome, MD;  Location: [place];  Service: Cardiovascular;  Laterality: N/A;  ? ROTATOR CUFF REPAIR    ? ? ?Current Outpatient Medications  ?Medication Sig Dispense Refill

## 2021-12-09 ENCOUNTER — Encounter: Payer: Self-pay | Admitting: Physician Assistant

## 2021-12-09 ENCOUNTER — Ambulatory Visit (INDEPENDENT_AMBULATORY_CARE_PROVIDER_SITE_OTHER): Payer: Managed Care, Other (non HMO) | Admitting: Physician Assistant

## 2021-12-09 VITALS — BP 98/70 | HR 63 | Ht 66.0 in | Wt 210.2 lb

## 2021-12-09 DIAGNOSIS — I471 Supraventricular tachycardia: Secondary | ICD-10-CM

## 2021-12-09 DIAGNOSIS — I480 Paroxysmal atrial fibrillation: Secondary | ICD-10-CM | POA: Diagnosis not present

## 2021-12-09 DIAGNOSIS — R079 Chest pain, unspecified: Secondary | ICD-10-CM

## 2021-12-09 DIAGNOSIS — Z79899 Other long term (current) drug therapy: Secondary | ICD-10-CM

## 2021-12-09 MED ORDER — ISOSORBIDE MONONITRATE ER 30 MG PO TB24: 15.0000 mg | ORAL_TABLET | Freq: Every day | ORAL | 3 refills | Status: DC

## 2021-12-09 NOTE — Patient Instructions (Addendum)
Medication Instructions:  ? ? ?START TAKING : ISOSORBIDE 15 MG ( HALF OF 30 MG ) ONCE A DAY  ? ? ?*If you need a refill on your cardiac medications before your next appointment, please call your pharmacy* ? ? ?Lab Work: [place] ? ? ?If you have labs (blood work) drawn today and your tests are completely normal, you will receive your results only by: ?MyChart Message (if you have MyChart) OR ?A paper copy in the mail ?If you have any lab test that is abnormal or we need to change your treatment, we will call you to review the results. ? ? ?Testing/Procedures: NONE ORDERED  TODAY ? ? ? ?Follow-Up: ?At [facility], you and your health needs are our priority.  As part of our continuing mission to provide you with exceptional heart care, we have created designated Provider Care Teams.  These Care Teams include your primary Cardiologist (physician) and Advanced Practice Providers (APPs -  Physician Assistants and Nurse Practitioners) who all work together to provide you with the care you need, when you need it. ? ?We recommend signing up for the patient portal called "MyChart".  Sign up information is provided on this After Visit Summary.  MyChart is used to connect with patients for Virtual Visits (Telemedicine).  Patients are able to view lab/test results, encounter notes, upcoming appointments, etc.  Non-urgent messages can be sent to your provider as well.   ?To learn more about what you can do with MyChart, go to [email or web address].   ? ?Your next appointment:  NEXT AVAILABLE TO DISCUSS ABLATION ? ?The format for your next appointment:   ?In Person ? ?Provider:   ?Allegra Lai, MD  ? ? ?Other Instructions ? ?

## 2021-12-15 ENCOUNTER — Ambulatory Visit (INDEPENDENT_AMBULATORY_CARE_PROVIDER_SITE_OTHER): Payer: Managed Care, Other (non HMO) | Admitting: Family Medicine

## 2021-12-15 ENCOUNTER — Encounter (HOSPITAL_BASED_OUTPATIENT_CLINIC_OR_DEPARTMENT_OTHER): Payer: Self-pay

## 2021-12-15 ENCOUNTER — Emergency Department (HOSPITAL_BASED_OUTPATIENT_CLINIC_OR_DEPARTMENT_OTHER)
Admission: EM | Admit: 2021-12-15 | Discharge: 2021-12-15 | Disposition: A | Discharge status: Home / Self Care | Payer: Commercial Managed Care - HMO | Attending: Emergency Medicine | Admitting: Emergency Medicine

## 2021-12-15 ENCOUNTER — Encounter: Payer: Self-pay | Admitting: Family Medicine

## 2021-12-15 ENCOUNTER — Other Ambulatory Visit: Payer: Self-pay

## 2021-12-15 ENCOUNTER — Other Ambulatory Visit: Payer: Self-pay | Admitting: Family Medicine

## 2021-12-15 VITALS — BP 108/62 | HR 67 | Temp 97.5°F | Ht 66.0 in | Wt 210.5 lb

## 2021-12-15 DIAGNOSIS — J45909 Unspecified asthma, uncomplicated: Secondary | ICD-10-CM | POA: Insufficient documentation

## 2021-12-15 DIAGNOSIS — K219 Gastro-esophageal reflux disease without esophagitis: Secondary | ICD-10-CM

## 2021-12-15 DIAGNOSIS — N189 Chronic kidney disease, unspecified: Secondary | ICD-10-CM | POA: Diagnosis not present

## 2021-12-15 DIAGNOSIS — Z79899 Other long term (current) drug therapy: Secondary | ICD-10-CM | POA: Diagnosis not present

## 2021-12-15 DIAGNOSIS — E1165 Type 2 diabetes mellitus with hyperglycemia: Secondary | ICD-10-CM

## 2021-12-15 DIAGNOSIS — N183 Chronic kidney disease, stage 3 unspecified: Secondary | ICD-10-CM | POA: Insufficient documentation

## 2021-12-15 DIAGNOSIS — G43009 Migraine without aura, not intractable, without status migrainosus: Secondary | ICD-10-CM | POA: Diagnosis not present

## 2021-12-15 DIAGNOSIS — Z7901 Long term (current) use of anticoagulants: Secondary | ICD-10-CM | POA: Diagnosis not present

## 2021-12-15 DIAGNOSIS — E1122 Type 2 diabetes mellitus with diabetic chronic kidney disease: Secondary | ICD-10-CM | POA: Diagnosis not present

## 2021-12-15 DIAGNOSIS — E1142 Type 2 diabetes mellitus with diabetic polyneuropathy: Secondary | ICD-10-CM | POA: Insufficient documentation

## 2021-12-15 DIAGNOSIS — E860 Dehydration: Secondary | ICD-10-CM | POA: Insufficient documentation

## 2021-12-15 DIAGNOSIS — I129 Hypertensive chronic kidney disease with stage 1 through stage 4 chronic kidney disease, or unspecified chronic kidney disease: Secondary | ICD-10-CM | POA: Diagnosis not present

## 2021-12-15 DIAGNOSIS — G43809 Other migraine, not intractable, without status migrainosus: Secondary | ICD-10-CM | POA: Diagnosis not present

## 2021-12-15 DIAGNOSIS — R519 Headache, unspecified: Secondary | ICD-10-CM | POA: Diagnosis present

## 2021-12-15 LAB — CBC WITH DIFFERENTIAL/PLATELET
Abs Immature Granulocytes: 0.03 10*3/uL (ref 0.00–0.07)
Basophils Absolute: 0.1 10*3/uL (ref 0.0–0.1)
Basophils Relative: 1 %
Eosinophils Absolute: 0.3 10*3/uL (ref 0.0–0.5)
Eosinophils Relative: 3 %
HCT: 35.1 % — ABNORMAL LOW (ref 36.0–46.0)
Hemoglobin: 11.4 g/dL — ABNORMAL LOW (ref 12.0–15.0)
Immature Granulocytes: 0 %
Lymphocytes Relative: 26 %
Lymphs Abs: 2.3 10*3/uL (ref 0.7–4.0)
MCH: 31.6 pg (ref 26.0–34.0)
MCHC: 32.5 g/dL (ref 30.0–36.0)
MCV: 97.2 fL (ref 80.0–100.0)
Monocytes Absolute: 0.6 10*3/uL (ref 0.1–1.0)
Monocytes Relative: 7 %
Neutro Abs: 5.6 10*3/uL (ref 1.7–7.7)
Neutrophils Relative %: 63 %
Platelets: 298 10*3/uL (ref 150–400)
RBC: 3.61 MIL/uL — ABNORMAL LOW (ref 3.87–5.11)
RDW: 16.1 % — ABNORMAL HIGH (ref 11.5–15.5)
WBC: 8.9 10*3/uL (ref 4.0–10.5)
nRBC: 0 % (ref 0.0–0.2)

## 2021-12-15 LAB — BASIC METABOLIC PANEL
Anion gap: 8 (ref 5–15)
BUN: 23 mg/dL (ref 8–23)
CO2: 20 mmol/L — ABNORMAL LOW (ref 22–32)
Calcium: 9.5 mg/dL (ref 8.9–10.3)
Chloride: 111 mmol/L (ref 98–111)
Creatinine, Ser: 1.97 mg/dL — ABNORMAL HIGH (ref 0.44–1.00)
GFR, Estimated: 28 mL/min — ABNORMAL LOW (ref >60)
Glucose, Bld: 110 mg/dL — ABNORMAL HIGH (ref 70–99)
Potassium: 4.1 mmol/L (ref 3.5–5.1)
Sodium: 139 mmol/L (ref 135–145)

## 2021-12-15 LAB — COMPREHENSIVE METABOLIC PANEL
ALT: 15 U/L (ref 0–35)
AST: 20 U/L (ref 0–37)
Albumin: 3.9 g/dL (ref 3.5–5.2)
Alkaline Phosphatase: 44 U/L (ref 39–117)
BUN: 19 mg/dL (ref 6–23)
CO2: 23 mEq/L (ref 19–32)
Calcium: 9.8 mg/dL (ref 8.4–10.5)
Chloride: 108 mEq/L (ref 96–112)
Creatinine, Ser: 1.81 mg/dL — ABNORMAL HIGH (ref 0.40–1.20)
GFR: 29.27 mL/min — ABNORMAL LOW (ref >60.00)
Glucose, Bld: 97 mg/dL (ref 70–99)
Potassium: 4.5 mEq/L (ref 3.5–5.1)
Sodium: 140 mEq/L (ref 135–145)
Total Bilirubin: 0.5 mg/dL (ref 0.2–1.2)
Total Protein: 6.3 g/dL (ref 6.0–8.3)

## 2021-12-15 LAB — LIPID PANEL
Cholesterol: 123 mg/dL (ref 0–200)
HDL: 23.3 mg/dL — ABNORMAL LOW (ref >39.00)
LDL Cholesterol: 66 mg/dL (ref 0–99)
NonHDL: 99.81
Total CHOL/HDL Ratio: 5
Triglycerides: 169 mg/dL — ABNORMAL HIGH (ref 0.0–149.0)
VLDL: 33.8 mg/dL (ref 0.0–40.0)

## 2021-12-15 LAB — MICROALBUMIN / CREATININE URINE RATIO
Creatinine,U: 185.9 mg/dL
Microalb Creat Ratio: 3 mg/g (ref 0.0–30.0)
Microalb, Ur: 5.7 mg/dL — ABNORMAL HIGH (ref 0.0–1.9)

## 2021-12-15 LAB — MAGNESIUM: Magnesium: 1.6 mg/dL — ABNORMAL LOW (ref 1.7–2.4)

## 2021-12-15 LAB — HEMOGLOBIN A1C: Hgb A1c MFr Bld: 5.8 % (ref 4.6–6.5)

## 2021-12-15 MED ORDER — DEXAMETHASONE SODIUM PHOSPHATE 10 MG/ML IJ SOLN
10.0000 mg | Freq: Once | INTRAMUSCULAR | Status: AC
Start: 2021-12-15 — End: 2021-12-15
  Administered 2021-12-15: 10 mg
  Filled 2021-12-15: qty 1

## 2021-12-15 MED ORDER — HYDROMORPHONE HCL 1 MG/ML IJ SOLN
1.0000 mg | Freq: Once | INTRAMUSCULAR | Status: AC
  Administered 2021-12-15: 1 mg via INTRAVENOUS
  Filled 2021-12-15: qty 1

## 2021-12-15 MED ORDER — LISINOPRIL 5 MG PO TABS: 5.0000 mg | ORAL_TABLET | Freq: Every day | ORAL | 3 refills | Status: DC

## 2021-12-15 MED ORDER — METOCLOPRAMIDE HCL 5 MG/ML IJ SOLN
5.0000 mg | Freq: Once | INTRAMUSCULAR | Status: AC
Start: 2021-12-15 — End: 2021-12-15
  Administered 2021-12-15: 5 mg via INTRAVENOUS
  Filled 2021-12-15: qty 2

## 2021-12-15 MED ORDER — LACTATED RINGERS IV BOLUS
1000.0000 mL | Freq: Once | INTRAVENOUS | Status: AC
  Administered 2021-12-15: 1000 mL via INTRAVENOUS

## 2021-12-15 MED ORDER — PANTOPRAZOLE SODIUM 40 MG PO TBEC: 40.0000 mg | DELAYED_RELEASE_TABLET | Freq: Two times a day (BID) | ORAL | 1 refills | Status: DC

## 2021-12-15 MED ORDER — DIPHENHYDRAMINE HCL 50 MG/ML IJ SOLN
25.0000 mg | Freq: Once | INTRAMUSCULAR | Status: AC
Start: 2021-12-15 — End: 2021-12-15
  Administered 2021-12-15: 25 mg via INTRAVENOUS
  Filled 2021-12-15: qty 1

## 2021-12-15 MED ORDER — ONDANSETRON HCL 4 MG/2ML IJ SOLN: 4.0000 mg | Freq: Once | INTRAMUSCULAR | Status: DC

## 2021-12-15 MED ORDER — MAGNESIUM SULFATE 2 GM/50ML IV SOLN
2.0000 g | Freq: Once | INTRAVENOUS | Status: AC
  Administered 2021-12-15: 2 g via INTRAVENOUS
  Filled 2021-12-15: qty 50

## 2021-12-15 NOTE — Patient Instructions (Addendum)
Give [place] 2-3 business days to get the results of your labs back.  ? ?Keep the diet clean and stay active. ? ?We may stop the metformin pending your results.  ? ?If you do not hear anything about your referral in the next 1-2 weeks, call our office and ask for an update. ? ?Take 200-400 mg daily of magnesium until you see the neurologist.  ? ?Let us know if you need anything. ?

## 2021-12-15 NOTE — Progress Notes (Signed)
Subjective:  ? ?Chief Complaint  ?Patient presents with  ? Follow-up  ?  1 month DM  ? ? ?Paula Stout is a 66 y.o. female here for follow-up of diabetes.   ?Paula Stout's self monitored glucose range is 70-90's.  ?Patient denies hypoglycemic reactions. ?She checks her glucose levels 1 time(s) per day. ?Patient does not require insulin.   ?Medications include: Trulicity 1.5 mg/week, Metformin 1000 mg bid.  ?Diet is healthy.  ?Exercise: none ?+Cp and SOB; has stress test and ablation  ? ?Migraines ?Hx of migraines. Taking Topamax 150 mg bid, Ubrelvy prn. Was seeing neuro but got d/c'd after moving back to [facility] temporarily. Still having headaches. Interested in seeing neuro again.  ? ?Past Medical History:  ?Diagnosis Date  ? A-fib ([place])   ? ACS (acute coronary syndrome) ([place]) 07/31/2021  ? Acute cystitis without hematuria 05/14/2017  ? Last Assessment & Plan:  Formatting of this note might be different from the original. - suprapubic tenderness with symptoms of urinary frequency and urgency and subjective fevers (no fevers recorded on any visit). Recently passed a renal stone, US shows left hydronephrosis which is improving.  - UA negative8/24 but pt is clinically symptomatic - will repeat UA  - start pt on Microbid for 5 days  ? Adhesive capsulitis of left shoulder 12/16/2016  ? Anxiety   ? Asthma without status asthmaticus 02/06/2021  ? At risk for falls 02/04/2019  ? Atrial fibrillation ([place]) 09/01/2014  ? Last Assessment & Plan:  Formatting of this note might be different from the original. A:  Chronic.  Sinus rhythm at this time.  States she was told this during admission at [facility] in the past. P:  On baby aspirin at home will continue.  Low dose metoprolol started.  ? Bipolar 1 disorder, depressed, moderate ([place]) 02/18/2020  ? Capsulitis of left shoulder 10/20/2021  ? Chronic anticoagulation 02/20/2020  ? Chronic atrial fibrillation ([place]) 02/18/2020  ? Chronic headache 10/11/2013  ? Chronic interstitial cystitis  12/28/2005  ? Chronic migraine without aura, intractable, without status migrainosus 06/24/2017  ? Colon cancer screening 04/15/2016  ? Last Assessment & Plan:  Formatting of this note might be different from the original. Will schedule for colonoscopy  ? Depression, recurrent ([place]) 02/18/2020  ? Diabetes mellitus ([place])   ? Dyslipidemia 02/20/2020  ? Dysmetabolic syndrome X 85/27/7824  ? GAD (generalized anxiety disorder) 02/18/2020  ? GERD (gastroesophageal reflux disease) 11/05/2021  ? Heartburn 04/15/2016  ? Last Assessment & Plan:  Formatting of this note might be different from the original. Patient was counseled regarding lifestyle modification  Advised to take Prilosec 30 mins before breakfast  Will schedule for EGD  ? History of atrial fibrillation 06/24/2017  ? History of posttraumatic stress disorder (PTSD) 10/07/2016  ? HLD (hyperlipidemia) 09/01/2014  ? Last Assessment & Plan:  Formatting of this note might be different from the original. A: Chronic. P: Continue statin.  ? Hyperosmolarity due to secondary diabetes mellitus ([place]) 02/06/2021  ? Hypertriglyceridemia 02/18/2020  ? Impaired mobility and activities of daily living 11/14/2015  ? Irritable bowel syndrome (IBS)   ? 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 Kidney stone 02/06/2021  ? Migraine without aura, not refractory 02/06/2021  ? Mixed hyperlipidemia 04/06/2021  ? Last Assessment & Plan:  Formatting of this note might be different from the original. Due for labs with PCP in November  ? Morbid obesity ([place]) 12/16/2016  ? Paroxysmal supraventricular tachycardia ([place]) 11/05/2021  ? Pneumonia, organism unspecified(486) 11/21/2002  ?  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 Primary hypertension 04/06/2021  ? Last Assessment & Plan:  Formatting of this note might be different from the original. Controlled  ? Refractory migraine with aura 02/06/2021  ? Refractory migraine without aura 02/06/2021  ? Renal colic on left side  42/39/5320  ? Stage 3 chronic kidney disease ([place]) 02/20/2020  ? Unspecified adverse effect of other drug, medicinal and biological substance(995.29) 02/17/2006  ? Urinary tract obstruction 02/06/2021  ?  ? ?Related testing: ?Retinal exam: Done ?Pneumovax: done ? ?Objective:  ?BP 108/62   Pulse 67   Temp (!) 97.5 ?F (36.4 ?C) (Oral)   Ht 5\' 6"  (1.676 m)   Wt 210 lb 8 oz (95.5 kg)   SpO2 97%   BMI 33.98 kg/m?  ?General:  Well developed, well nourished, in no apparent distress ?Skin:  Warm, no pallor or diaphoresis ?Head:  Normocephalic, atraumatic ?Eyes:  Pupils equal and round, sclera anicteric without injection  ?Lungs:  CTAB, no access msc use ?Cardio:  RRR, no bruits, no LE edema ?Musculoskeletal:  Symmetrical muscle groups noted without atrophy or deformity ?Neuro:  Sensation intact to pinprick on feet ?Psych: Age appropriate judgment and insight ? ?Assessment:  ? ?Type 2 diabetes mellitus with hyperglycemia, without long-term current use of insulin (HCC) - Plan: Microalbumin / creatinine urine ratio, Lipid panel, Hemoglobin A1c, Comprehensive metabolic panel ? ?Migraine without aura, not refractory - Plan: Ambulatory referral to Neurology ? ?Gastroesophageal reflux disease, unspecified whether esophagitis present - Plan: pantoprazole (PROTONIX) 40 MG tablet  ? ?Plan:  ? ?Chronic, stable. Cont Trulicity 1.5 mg/week, Metformin 1000 mg bid. If controlled, will remove metformin. Counseled on diet and exercise. ?Chronic, unstable. Cont Topamax and Ubrelvy, refer to Neuro. Add Mg 200-400 mg/d until seen by Neuro.  ?F/u in 6 mo for CPE or prn. ?The patient voiced understanding and agreement to the plan. ? ?[name], DO ?12/15/21 ?11:08 AM ? ?

## 2021-12-15 NOTE — ED Triage Notes (Signed)
Pt arrives with c/o headache that started yesterday. Per pt, she has a hx of migraines and she has taken her migraine meds without relief.  ?

## 2021-12-15 NOTE — Discharge Instructions (Signed)
It was a pleasure caring for you today in the emergency department. ? ?Please return to the emergency department for any worsening or worrisome symptoms. ? ? ?Please follow-up with your neurologist ?

## 2021-12-15 NOTE — ED Provider Notes (Addendum)
?[place] EMERGENCY DEPARTMENT ?Provider Note ? ? ?CSN: [id number] ?Arrival date & time: 12/15/21  1525 ? ?  ? ?History ? ?Chief Complaint  ?Patient presents with  ? Headache  ? ? ?Paula Stout is a 66 y.o. female. ? ? Patient as above with significant medical history as below, including paroxysmal SVT, migraines, CKD who presents to the ED with complaint of headache. ? ?Onset of headache yesterday, similar to her prior migraines.  Described as sharp, stabbing, right-sided.  Associate with photophobia and phonophobia.  Nausea and vomiting.  Similar to prior headaches.  She took her home migraine medication without significant improvement to her symptoms.  She is scheduled to follow-up with neurology regarding migraine management.  ? ?She denies fevers, chills, chest pain, dyspnea, neck pain, trauma, diplopia, hearing changes, tinnitus. ? ? ?Past Medical History: ?No date: A-fib [facility]) ?07/31/2021: ACS (acute coronary syndrome) ([place]) ?05/14/2017: Acute cystitis without hematuria ?    Comment:  Last Assessment & Plan:  Formatting of this note might  ?             be different from the original. - suprapubic tenderness  ?             with symptoms of urinary frequency and urgency and  ?             subjective fevers (no fevers recorded on any visit).  ?             Recently passed a renal stone, US shows left  ?             hydronephrosis which is improving.  - UA negative8/24 but ?             pt is clinically symptomatic - will repeat UA  - start pt ?             on Microbid for 5 days ?12/16/2016: Adhesive capsulitis of left shoulder ?No date: Anxiety ?02/06/2021: Asthma without status asthmaticus ?02/04/2019: At risk for falls ?09/01/2014: Atrial fibrillation ([place]) ?    Comment:  Last Assessment & Plan:  Formatting of this note might  ?             be different from the original. A:  Chronic.  Sinus  ?             rhythm at this time.  States she was told this during  ?             admission at [facility] in the past. P:  On baby aspirin at ?             home will continue.  Low dose metoprolol started. ?02/18/2020: Bipolar 1 disorder, depressed, moderate ([place]) ?10/20/2021: Capsulitis of left shoulder ?02/20/2020: Chronic anticoagulation ?02/18/2020: Chronic atrial fibrillation ([place]) ?10/11/2013: Chronic headache ?12/28/2005: Chronic interstitial cystitis ?06/24/2017: Chronic migraine without aura, intractable, without  ?status migrainosus ?04/15/2016: Colon cancer screening ?    Comment:  Last Assessment & Plan:  Formatting of this note might  ?             be different from the original. Will schedule for  ?             colonoscopy ?02/18/2020: Depression, recurrent ([place]) ?No date: Diabetes mellitus ([place]) ?02/20/2020: Dyslipidemia ?40/34/7425: Dysmetabolic syndrome X ?95/63/8756: GAD (generalized anxiety disorder) ?11/05/2021: GERD (gastroesophageal reflux disease) ?04/15/2016: Heartburn ?    Comment:  Last Assessment & Plan:  Formatting of  this note might  ?             be different from the original. Patient was counseled  ?             regarding lifestyle modification  Advised to take  ?             Prilosec 30 mins before breakfast  Will schedule for EGD ?06/24/2017: History of atrial fibrillation ?10/07/2016: History of posttraumatic stress disorder (PTSD) ?09/01/2014: HLD (hyperlipidemia) ?    Comment:  Last Assessment & Plan:  Formatting of this note might  ?             be different from the original. A: Chronic. P: Continue  ?             statin. 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    Comment:  Last Assessment & Plan:  Formatting of this note might  ?             be different from  the original. Due for labs with PCP in  ?             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    Comment:  Last Assessment & Plan:  Formatting of this note might  ?             be different from the original. Controlled ?02/06/2021: Refractory migraine with aura ?02/06/2021: Refractory migraine without aura ?68/08/7516: Renal colic on left side ?00/17/4944: Stage 3 chronic kidney disease ([place]) ?02/17/2006: Unspecified adverse effect of other drug, medicinal and  ?biological substance(995.29) ?02/06/2021: Urinary tract obstruction ? ?Past Surgical History: ?No date: ABDOMINAL HYSTERECTOMY ?    Comment:  Fibroids ?No date: ATRIAL FIBRILLATION ABLATION ?No date: CARDIAC CATHETERIZATION ?No date: CARDIOVERSION ?No date: CESAREAN SECTION ?No date: KNEE SURGERY ?03/04/2020: LEFT HEART CATH AND CORONARY ANGIOGRAPHY; N/A ?    Comment:  Procedure: LEFT HEART CATH AND CORONARY ANGIOGRAPHY;   ?             Surgeon: Belva Crome, MD;  Location: [place]  ?             LAB;  Service: Cardiovascular;  Laterality: N/A; ?No date: ROTATOR CUFF REPAIR  ? ? ?The history is provided by the patient. No language interpreter was used.  ?Headache ?Associated symptoms: nausea, photophobia and vomiting   ?Associated symptoms: no abdominal pain, no cough and no fever   ? ?  ? ?Home Medications ?Prior to Admission medications   ?Medication Sig Start Date End Date Taking? Authorizing Provider  ?acetaminophen (TYLENOL) 500 MG tablet Take 1,000 mg by mouth every 6 (six) hours as needed for mild pain.    [provider]  ?albuterol (VENTOLIN HFA) 108 (90 Base) MCG/ACT inhaler Inhale 2 puffs into the lungs every 4 (four) hours as needed for wheezing. 06/23/20   [name], DO  ?apixaban (ELIQUIS) 5 MG TABS  tablet Take 1 tablet (5 mg total) by mouth 2 (two) times daily. 12/07/21   [name], DO  ?atenolol (TENORMIN) 50 MG tablet Take 1 tablet (50 mg total) by mouth daily. 12/07/21  [name], DO  ?atorvastatin (LIPITOR) 80 MG tablet Take 1 tablet (80 mg total) by mouth daily. 12/07/21   [name], DO  ?B Complex Vitamins (B COMPLEX PO) Take 1 tablet by mouth daily.    [provider]  ?blood glucose meter kit and supplies One Touch Ultra  Check blood sugars once daily Dx: E11.9 ?Patient taking differently: 1 each by Other route See admin instructions. One Touch Ultra  Check blood sugars once daily Dx: E11.9 10/01/21   [name], DO  ?buPROPion (WELLBUTRIN XL) 300 MG 24 hr tablet Take 1 tablet (300 mg total) by mouth daily. 10/20/21   [name], DO  ?EPINEPHrine 0.3 mg/0.3 mL IJ SOAJ injection Inject 0.3 mg into the muscle as needed for anaphylaxis. 11/17/20   [name], DO  ?ezetimibe (ZETIA) 10 MG tablet Take 10 mg by mouth daily.    [provider]  ?fenofibrate 160 MG tablet Take 1 tablet (160 mg total) by mouth daily. ?Patient taking differently: Take 160 mg by mouth every evening. 09/22/21   Revankar, Reita Cliche, MD  ?flecainide (TAMBOCOR) 50 MG tablet Take 1 tablet (50 mg total) by mouth 2 (two) times daily. 09/22/21   Revankar, Reita Cliche, MD  ?Galcanezumab-gnlm (EMGALITY) 120 MG/ML SOAJ Inject 120 mg into the skin every 30 (thirty) days. 11/09/21   [name], DO  ?glucose blood (ONETOUCH VERIO) test strip Use daily to check blood sugar. ?Patient taking differently: 1 each by Other route as needed for other (see instruction). Use daily to check blood sugar. 10/14/21   [name], DO  ?isosorbide mononitrate (IMDUR) 30 MG 24 hr tablet Take 0.5 tablets (15 mg total) by mouth daily. 12/09/21 03/09/22  Baldwin Jamaica, PA-C  ?levocetirizine (XYZAL) 5 MG tablet Take 1 tablet (5 mg total) by mouth every evening.  10/14/21   [name], DO  ?lisinopril (ZESTRIL) 5 MG tablet Take 1 tablet (5 mg total) by mouth daily. 12/15/21   [name], DO  ?Lurasidone HCl 120 MG TABS Take 1 tablet (120 mg t

## 2021-12-17 ENCOUNTER — Ambulatory Visit (HOSPITAL_BASED_OUTPATIENT_CLINIC_OR_DEPARTMENT_OTHER)
Admission: RE | Admit: 2021-12-17 | Discharge: 2021-12-17 | Disposition: A | Discharge status: Home / Self Care | Payer: Commercial Managed Care - HMO | Source: Ambulatory Visit | Attending: Cardiology | Admitting: Cardiology

## 2021-12-17 DIAGNOSIS — G4730 Sleep apnea, unspecified: Secondary | ICD-10-CM | POA: Diagnosis present

## 2021-12-17 DIAGNOSIS — F319 Bipolar disorder, unspecified: Secondary | ICD-10-CM | POA: Insufficient documentation

## 2021-12-17 DIAGNOSIS — E088 Diabetes mellitus due to underlying condition with unspecified complications: Secondary | ICD-10-CM | POA: Diagnosis present

## 2021-12-17 DIAGNOSIS — I480 Paroxysmal atrial fibrillation: Secondary | ICD-10-CM | POA: Insufficient documentation

## 2021-12-17 DIAGNOSIS — I471 Supraventricular tachycardia: Secondary | ICD-10-CM | POA: Diagnosis not present

## 2021-12-17 DIAGNOSIS — R0609 Other forms of dyspnea: Secondary | ICD-10-CM | POA: Diagnosis not present

## 2021-12-17 DIAGNOSIS — R079 Chest pain, unspecified: Secondary | ICD-10-CM | POA: Insufficient documentation

## 2021-12-17 LAB — ECHOCARDIOGRAM COMPLETE
AR max vel: 1.88 cm2
AV Area VTI: 2.19 cm2
AV Area mean vel: 1.9 cm2
AV Mean grad: 4 mmHg
AV Peak grad: 6.5 mmHg
Ao pk vel: 1.27 m/s
Area-P 1/2: 3.19 cm2
S' Lateral: 3.4 cm

## 2021-12-17 NOTE — Progress Notes (Signed)
?  Echocardiogram ?2D Echocardiogram has been performed. ? ?Elmer Ramp ?12/17/2021, 10:30 AM ?

## 2021-12-22 ENCOUNTER — Other Ambulatory Visit: Payer: Self-pay | Admitting: Family Medicine

## 2021-12-22 MED ORDER — EZETIMIBE 10 MG PO TABS: 10.0000 mg | ORAL_TABLET | Freq: Every day | ORAL | 0 refills | Status: DC

## 2021-12-28 ENCOUNTER — Encounter: Payer: Self-pay | Admitting: Family Medicine

## 2021-12-29 ENCOUNTER — Other Ambulatory Visit: Payer: Self-pay | Admitting: Family Medicine

## 2021-12-29 ENCOUNTER — Encounter: Payer: Self-pay | Admitting: Neurology

## 2021-12-29 MED ORDER — LISINOPRIL 5 MG PO TABS: 5.0000 mg | ORAL_TABLET | Freq: Every day | ORAL | 3 refills | Status: DC

## 2022-01-02 ENCOUNTER — Encounter: Payer: Self-pay | Admitting: Family Medicine

## 2022-01-04 ENCOUNTER — Encounter: Payer: Self-pay | Admitting: Family Medicine

## 2022-01-04 MED ORDER — EMGALITY 120 MG/ML ~~LOC~~ SOAJ: 120.0000 mg | SUBCUTANEOUS | 2 refills | Status: DC

## 2022-01-12 ENCOUNTER — Encounter: Payer: Self-pay | Admitting: Cardiology

## 2022-01-12 ENCOUNTER — Ambulatory Visit (INDEPENDENT_AMBULATORY_CARE_PROVIDER_SITE_OTHER): Payer: Commercial Managed Care - HMO

## 2022-01-12 ENCOUNTER — Ambulatory Visit (INDEPENDENT_AMBULATORY_CARE_PROVIDER_SITE_OTHER): Payer: Commercial Managed Care - HMO | Admitting: Cardiology

## 2022-01-12 VITALS — BP 92/62 | HR 47 | Ht 66.0 in | Wt 216.4 lb

## 2022-01-12 DIAGNOSIS — E088 Diabetes mellitus due to underlying condition with unspecified complications: Secondary | ICD-10-CM

## 2022-01-12 DIAGNOSIS — I480 Paroxysmal atrial fibrillation: Secondary | ICD-10-CM

## 2022-01-12 DIAGNOSIS — E785 Hyperlipidemia, unspecified: Secondary | ICD-10-CM | POA: Diagnosis not present

## 2022-01-12 DIAGNOSIS — I471 Supraventricular tachycardia: Secondary | ICD-10-CM

## 2022-01-12 DIAGNOSIS — F3132 Bipolar disorder, current episode depressed, moderate: Secondary | ICD-10-CM

## 2022-01-12 DIAGNOSIS — I959 Hypotension, unspecified: Secondary | ICD-10-CM

## 2022-01-12 NOTE — Patient Instructions (Signed)
Medication Instructions:  ?Your physician recommends that you continue on your current medications as directed. Please refer to the Current Medication list given to you today.  ?*If you need a refill on your cardiac medications before your next appointment, please call your pharmacy* ? ? ?Lab Work: ?CBC, TSH, Thyroid profile, Cortisol Level today ?If you have labs (blood work) drawn today and your tests are completely normal, you will receive your results only by: ?MyChart Message (if you have MyChart) OR ?A paper copy in the mail ?If you have any lab test that is abnormal or we need to change your treatment, we will call you to review the results. ? ? ?Testing/Procedures: ? ?WHY IS MY DOCTOR PRESCRIBING ZIO? ?The Zio system is proven and trusted by physicians to detect and diagnose irregular heart rhythms -- and has been prescribed to hundreds of thousands of patients. ? ?The FDA has cleared the Zio system to monitor for many different kinds of irregular heart rhythms. In a study, physicians were able to reach a diagnosis 90% of the time with the Zio system1. ? ?You can wear the Zio monitor -- a small, discreet, comfortable patch -- during your normal day-to-day activity, including while you sleep, shower, and exercise, while it records every single heartbeat for analysis. ? ?1Barrett, P., et al. Comparison of 24 Hour Holter Monitoring Versus 14 Day Novel Adhesive Patch Electrocardiographic Monitoring. [place], 2014. ? ?ZIO VS. HOLTER MONITORING ?The Zio monitor can be comfortably worn for up to 14 days. Holter monitors can be worn for 24 to 48 hours, limiting the time to record any irregular heart rhythms you may have. Zio is able to capture data for the 51% of patients who have their first symptom-triggered arrhythmia after 48 hours.1 ? ?LIVE WITHOUT RESTRICTIONS ?The Zio ambulatory cardiac monitor is a small, unobtrusive, and water-resistant patch--you might even forget you?re wearing it.  The Zio monitor records and stores every beat of your heart, whether you're sleeping, working out, or showering.   ? ? ?Follow-Up: ?At [facility], you and your health needs are our priority.  As part of our continuing mission to provide you with exceptional heart care, we have created designated Provider Care Teams.  These Care Teams include your primary Cardiologist (physician) and Advanced Practice Providers (APPs -  Physician Assistants and Nurse Practitioners) who all work together to provide you with the care you need, when you need it. ? ?We recommend signing up for the patient portal called "MyChart".  Sign up information is provided on this After Visit Summary.  MyChart is used to connect with patients for Virtual Visits (Telemedicine).  Patients are able to view lab/test results, encounter notes, upcoming appointments, etc.  Non-urgent messages can be sent to your provider as well.   ?To learn more about what you can do with MyChart, go to [email or web address].   ? ?Your next appointment:   ?3 month(s) ? ?The format for your next appointment:   ?In Person ? ?Provider:   ?Jenne Campus, MD  ? ? ?Other Instructions ?NA  ?

## 2022-01-12 NOTE — Progress Notes (Signed)
?Cardiology Office Note:   ? ?Date:  01/12/2022  ? ?ID:  Paula Stout, DOB 14-Aug-1956, MRN [id number] ? ?PCP:  [name], DO  ?Cardiologist:  Jenne Campus, MD   ? ?Referring MD: [name]*  ? ?Chief Complaint  ?Patient presents with  ? Results  ? ? ?History of Present Illness:   ? ?Paula Stout is a 66 y.o. 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  She is in my office today for follow-up.  Overall she is doing fairly complain of being weak tired exhausted sometimes dizzy she looks very pale and I walk into the room she is also bradycardic her EKG showed heart rate 45 with blood pressure 92/62.  She denies have any syncope or passing out.  There is no chest pain tightness squeezing pressure in the chest.  Just fatigue tiredness.  Described to have some episode of palpitations does last only for few minutes up to 15 minutes. ? ?Past Medical History:  ?Diagnosis Date  ? A-fib ([place])   ? ACS (acute coronary syndrome) ([place]) 07/31/2021  ? Acute cystitis without hematuria 05/14/2017  ? Last Assessment & Plan:  Formatting of this note might be different from the original. - suprapubic tenderness with symptoms of urinary frequency and urgency and subjective fevers (no fevers recorded on any visit). Recently passed a renal stone, US shows left hydronephrosis which is improving.  - UA negative8/24 but pt is clinically symptomatic - will repeat UA  - start pt on Microbid for 5 days  ? Adhesive capsulitis of left shoulder 12/16/2016  ? Anxiety   ? Asthma without status asthmaticus 02/06/2021  ? At risk for falls 02/04/2019  ? Atrial fibrillation ([place]) 09/01/2014  ? Last Assessment & Plan:  Formatting of this note  might be different from the original. A:  Chronic.  Sinus rhythm at this time.  States she was told this during admission at [facility] in the past. P:  On baby aspirin at home will continue.  Low dose metoprolol started.  ? Bipolar 1 disorder, depressed, moderate ([place]) 02/18/2020  ? Capsulitis of left shoulder 10/20/2021  ? Chronic anticoagulation 02/20/2020  ? Chronic atrial fibrillation ([place]) 02/18/2020  ? Chronic headache 10/11/2013  ? Chronic interstitial cystitis 12/28/2005  ? Chronic migraine without aura, intractable, without status migrainosus 06/24/2017  ? Colon cancer screening 04/15/2016  ? Last Assessment & Plan:  Formatting of this note might be different from the original. Will schedule for colonoscopy  ? Depression, recurrent ([place]) 02/18/2020  ? Diabetes mellitus ([place])   ? Dyslipidemia 02/20/2020  ? Dysmetabolic syndrome X 85/10/7739  ? GAD (generalized anxiety disorder) 02/18/2020  ? GERD (gastroesophageal reflux disease) 11/05/2021  ? Heartburn 04/15/2016  ? Last Assessment & Plan:  Formatting of this note might be different from the original. Patient was counseled regarding lifestyle modification  Advised to take Prilosec 30 mins before breakfast  Will schedule for EGD  ? History of atrial fibrillation 06/24/2017  ? History of posttraumatic stress disorder (PTSD) 10/07/2016  ? HLD (hyperlipidemia) 09/01/2014  ? Last Assessment & Plan:  Formatting of this note might be different from the original. A: Chronic. P: Continue statin.  ? Hyperosmolarity due to secondary diabetes mellitus ([place]) 02/06/2021  ? Hypertriglyceridemia 02/18/2020  ? Impaired mobility and activities  of daily living 11/14/2015  ? Irritable bowel syndrome (IBS)   ? 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 Kidney stone 02/06/2021  ? Migraine without aura, not refractory 02/06/2021  ? Mixed hyperlipidemia 04/06/2021  ? Last Assessment & Plan:  Formatting of this note might be different from the original. Due for labs with PCP in  November  ? Morbid obesity ([place]) 12/16/2016  ? Paroxysmal supraventricular tachycardia ([place]) 11/05/2021  ? Pneumonia, organism unspecified(486) 11/21/2002  ? 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 Primary hypertension 04/06/2021  ? Last Assessment & Plan:  Formatting of this note might be different from the original. Controlled  ? Refractory migraine with aura 02/06/2021  ? Refractory migraine without aura 02/06/2021  ? Renal colic on left side 32/44/0102  ? Stage 3 chronic kidney disease ([place]) 02/20/2020  ? Unspecified adverse effect of other drug, medicinal and biological substance(995.29) 02/17/2006  ? Urinary tract obstruction 02/06/2021  ? ? ?Past Surgical History:  ?Procedure Laterality Date  ? ABDOMINAL HYSTERECTOMY    ? Fibroids  ? ATRIAL FIBRILLATION ABLATION    ? CARDIAC CATHETERIZATION    ? CARDIOVERSION    ? CESAREAN SECTION    ? KNEE SURGERY    ? LEFT HEART CATH AND CORONARY ANGIOGRAPHY N/A 03/04/2020  ? Procedure: LEFT HEART CATH AND CORONARY ANGIOGRAPHY;  Surgeon: Belva Crome, MD;  Location: [facility];  Service: Cardiovascular;  Laterality: N/A;  ? ROTATOR CUFF REPAIR    ? ? ?Current Medications: ?Current Meds  ?Medication Sig  ? acetaminophen (TYLENOL) 500 MG tablet Take 1,000 mg by mouth every 6 (six) hours as needed for mild pain.  ? albuterol (VENTOLIN HFA) 108 (90 Base) MCG/ACT inhaler Inhale 2 puffs into the lungs every 4 (four) hours as needed for wheezing.  ? apixaban (ELIQUIS) 5 MG TABS tablet Take 1 tablet (5 mg total) by mouth 2 (two) times daily.  ? atenolol (TENORMIN) 50 MG tablet Take 1 tablet (50 mg total) by mouth daily.  ? atorvastatin (LIPITOR) 80 MG tablet Take 1 tablet (80 mg total) by mouth daily.  ? B Complex Vitamins (B COMPLEX PO) Take 1 tablet by mouth daily.  ? blood glucose meter kit and supplies One Touch Ultra  Check 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 One Touch  Ultra  Check blood sugars once daily Dx: E11.9)  ? buPROPion (WELLBUTRIN XL) 300 MG 24 hr tablet Take 1 tablet (300 mg total) by mouth daily.  ? EPINEPHrine 0.3 mg/0.3 mL IJ SOAJ injection Inject 0.3 mg into the muscle as needed for anaphylaxis.  ? ezetimibe (ZETIA) 10 MG tablet Take 1 tablet (10 mg total) by mouth daily.  ? fenofibrate 160 MG tablet Take 1 tablet (160 mg total) by mouth daily. (Patient taking differently: Take 160 mg by mouth every evening.)  ? flecainide (TAMBOCOR) 50 MG tablet Take 1 tablet (50 mg total) by mouth 2 (two) times daily.  ? Galcanezumab-gnlm (EMGALITY) 120 MG/ML SOAJ Inject 120 mg into the skin every 30 (thirty) days.  ? glucose blood (ONETOUCH VERIO) test strip Use daily to check blood sugar. (Patient taking differently: 1 each by Other route as needed for other (see instruction). Use daily to check blood sugar.)  ? isosorbide mononitrate (IMDUR) 30 MG 24 hr tablet Take 0.5 tablets (15 mg total) by mouth daily.  ? levocetirizine (XYZAL) 5 MG tablet Take 1 tablet (5 mg total)  by mouth every evening.  ? lisinopril (ZESTRIL) 5 MG tablet Take 1 tablet (5 mg total) by mouth daily.  ? Lurasidone HCl 120 MG TABS Take 1 tablet (120 mg total) by mouth at bedtime.  ? nitroGLYCERIN (NITROSTAT) 0.4 MG SL tablet Place 1 tablet (0.4 mg total) under the tongue every 5 (five) minutes as needed for chest pain.  ? ondansetron (ZOFRAN-ODT) 4 MG disintegrating tablet Take 1 tablet (4 mg total) by mouth every 8 (eight) hours as needed for nausea.  ? pantoprazole (PROTONIX) 40 MG tablet Take 1 tablet (40 mg total) by mouth 2 (two) times daily before a meal.  ? QUEtiapine (SEROQUEL) 300 MG tablet Take 1 tablet (300 mg total) by mouth at bedtime.  ? topiramate (TOPAMAX) 50 MG tablet TAKE 3 TABLETS TWICE A DAY (Patient taking differently: Take 150 mg by mouth 2 (two) times daily.)  ? TRULICITY 1.5 DI/5.4KP SOPN Inject 1.5 mg into the skin as directed once a week.  ? Ubrogepant 100 MG TABS Take 100 mg by  mouth daily as needed for migraine.  ? VITAMIN D PO Take 1 tablet by mouth daily.  ?  ? ?Allergies:   Bee venom, Onion, Tetanus-diphtheria toxoids td, Sumatriptan, Asa [aspirin], Nsaids, Iodine, Sulfa antib

## 2022-01-13 LAB — CBC
Hematocrit: 38 % (ref 34.0–46.6)
Hemoglobin: 12 g/dL (ref 11.1–15.9)
MCH: 31.1 pg (ref 26.6–33.0)
MCHC: 31.6 g/dL (ref 31.5–35.7)
MCV: 98 fL — ABNORMAL HIGH (ref 79–97)
Platelets: 359 10*3/uL (ref 150–450)
RBC: 3.86 x10E6/uL (ref 3.77–5.28)
RDW: 14.5 % (ref 11.7–15.4)
WBC: 7 10*3/uL (ref 3.4–10.8)

## 2022-01-13 LAB — TSH: TSH: 1.49 u[IU]/mL (ref 0.450–4.500)

## 2022-01-13 LAB — THYROID PANEL
Free Thyroxine Index: 1.6 (ref 1.2–4.9)
T3 Uptake Ratio: 25 % (ref 24–39)
T4, Total: 6.5 ug/dL (ref 4.5–12.0)

## 2022-01-13 LAB — CORTISOL: Cortisol: 15.2 ug/dL (ref 6.2–19.4)

## 2022-01-16 ENCOUNTER — Other Ambulatory Visit: Payer: Self-pay | Admitting: Family Medicine

## 2022-01-18 ENCOUNTER — Ambulatory Visit: Payer: Commercial Managed Care - HMO | Admitting: Cardiology

## 2022-01-18 ENCOUNTER — Encounter: Payer: Self-pay | Admitting: Cardiology

## 2022-01-18 VITALS — BP 122/78 | HR 58 | Ht 66.0 in | Wt 217.0 lb

## 2022-01-18 DIAGNOSIS — I471 Supraventricular tachycardia: Secondary | ICD-10-CM

## 2022-01-18 NOTE — Patient Instructions (Signed)
Medication Instructions:  ?Your physician recommends that you continue on your current medications as directed. Please refer to the Current Medication list given to you today. ? ?*If you need a refill on your cardiac medications before your next appointment, please call your pharmacy* ? ? ?Lab Work: ?None ordered ? ? ?Testing/Procedures: ?None ordered ? ? ?Follow-Up: ?At [facility], you and your health needs are our priority.  As part of our continuing mission to provide you with exceptional heart care, we have created designated Provider Care Teams.  These Care Teams include your primary Cardiologist (physician) and Advanced Practice Providers (APPs -  Physician Assistants and Nurse Practitioners) who all work together to provide you with the care you need, when you need it. ? ?Your next appointment:   ?To be  determined after your monitor is completed ? ?The format for your next appointment:   ?In Person ? ?Provider:   ?Allegra Lai, MD  ? ? ?Thank you for choosing [facility]!! ? ? ?Trinidad Curet, RN ?[phone] ? ?Other Instructions ? ? ? ?Important Information About Sugar ? ? ? ? ? ? ? ? ?  ?

## 2022-01-18 NOTE — Progress Notes (Signed)
? ?Electrophysiology Office Note ? ? ?Date:  01/18/2022  ? ?ID:  Paula Stout, DOB 07/19/1956, MRN [id number] ? ?PCP:  [name], DO  ?Cardiologist: Revankar ?Primary Electrophysiologist:  Kamarri Fischetti [name], MD   ? ?Chief Complaint: Atrial fibrillation ?  ?History of Present Illness: ?Paula Stout is a 66 y.o. female who is being seen today for the evaluation of atrial fibrillation at the request of [name]*. Presenting today for electrophysiology evaluation. ? ?She has a history significant for paroxysmal atrial fibrillation, type 2 diabetes, hyperlipidemia, stage III CKD.  She is status post ablation with PVI and CTI in [place] 09/27/2018.  She has SVT. ? ?She presented to the emergency room 11/06/2021 with palpitations.  She was found to have SVT.  She was given adenosine which converted her to sinus rhythm.  Since then, she has continued to have palpitations.  She is currently wearing a cardiac monitor. ? ?Today, denies symptoms of palpitations, chest pain, shortness of breath, orthopnea, PND, lower extremity edema, claudication, dizziness, presyncope, syncope, bleeding, or neurologic sequela. The patient is tolerating medications without difficulties.   ? ?Past Medical History:  ?Diagnosis Date  ? A-fib ([place])   ? ACS (acute coronary syndrome) ([place]) 07/31/2021  ? Acute cystitis without hematuria 05/14/2017  ? Last Assessment & Plan:  Formatting of this note might be different from the original. - suprapubic tenderness with symptoms of urinary frequency and urgency and subjective fevers (no fevers recorded on any visit). Recently passed a renal stone, US shows left hydronephrosis which is improving.  - UA negative8/24 but pt is clinically symptomatic - Ama Mcmaster repeat UA  - start pt on Microbid for 5 days  ? Adhesive capsulitis of left shoulder 12/16/2016  ? Anxiety   ? Asthma without status asthmaticus 02/06/2021  ? At risk for falls 02/04/2019  ? Atrial fibrillation ([place])  09/01/2014  ? Last Assessment & Plan:  Formatting of this note might be different from the original. A:  Chronic.  Sinus rhythm at this time.  States she was told this during admission at [facility] in the past. P:  On baby aspirin at home Brandy Kabat continue.  Low dose metoprolol started.  ? Bipolar 1 disorder, depressed, moderate ([place]) 02/18/2020  ? Capsulitis of left shoulder 10/20/2021  ? Chronic anticoagulation 02/20/2020  ? Chronic atrial fibrillation ([place]) 02/18/2020  ? Chronic headache 10/11/2013  ? Chronic interstitial cystitis 12/28/2005  ? Chronic migraine without aura, intractable, without status migrainosus 06/24/2017  ? Colon cancer screening 04/15/2016  ? Last Assessment & Plan:  Formatting of this note might be different from the original. Lorain Keast schedule for colonoscopy  ? Depression, recurrent ([place]) 02/18/2020  ? Diabetes mellitus ([place])   ? Dyslipidemia 02/20/2020  ? Dysmetabolic syndrome X 32/99/2426  ? GAD (generalized anxiety disorder) 02/18/2020  ? GERD (gastroesophageal reflux disease) 11/05/2021  ? Heartburn 04/15/2016  ? Last Assessment & Plan:  Formatting of this note might be different from the original. Patient was counseled regarding lifestyle modification  Advised to take Prilosec 30 mins before breakfast  Twinkle Sockwell schedule for EGD  ? History of atrial fibrillation 06/24/2017  ? History of posttraumatic stress disorder (PTSD) 10/07/2016  ? HLD (hyperlipidemia) 09/01/2014  ? Last Assessment & Plan:  Formatting of this note might be different from the original. A: Chronic. P: Continue statin.  ? Hyperosmolarity due to secondary diabetes mellitus ([place]) 02/06/2021  ? Hypertriglyceridemia 02/18/2020  ? Impaired mobility and activities of daily living 11/14/2015  ? Irritable bowel  syndrome (IBS)   ? 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 Kidney stone 02/06/2021  ? Migraine without aura, not refractory 02/06/2021  ? Mixed hyperlipidemia 04/06/2021  ? Last Assessment & Plan:  Formatting of this note  might be different from the original. Due for labs with PCP in November  ? Morbid obesity ([place]) 12/16/2016  ? Paroxysmal supraventricular tachycardia ([place]) 11/05/2021  ? Pneumonia, organism unspecified(486) 11/21/2002  ? 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 Primary hypertension 04/06/2021  ? Last Assessment & Plan:  Formatting of this note might be different from the original. Controlled  ? Refractory migraine with aura 02/06/2021  ? Refractory migraine without aura 02/06/2021  ? Renal colic on left side 29/93/7169  ? Stage 3 chronic kidney disease ([place]) 02/20/2020  ? Unspecified adverse effect of other drug, medicinal and biological substance(995.29) 02/17/2006  ? Urinary tract obstruction 02/06/2021  ? ?Past Surgical History:  ?Procedure Laterality Date  ? ABDOMINAL HYSTERECTOMY    ? Fibroids  ? ATRIAL FIBRILLATION ABLATION    ? CARDIAC CATHETERIZATION    ? CARDIOVERSION    ? CESAREAN SECTION    ? KNEE SURGERY    ? LEFT HEART CATH AND CORONARY ANGIOGRAPHY N/A 03/04/2020  ? Procedure: LEFT HEART CATH AND CORONARY ANGIOGRAPHY;  Surgeon: Belva Crome, MD;  Location: [facility];  Service: Cardiovascular;  Laterality: N/A;  ? ROTATOR CUFF REPAIR    ? ? ? ?Current Outpatient Medications  ?Medication Sig Dispense Refill  ? acetaminophen (TYLENOL) 500 MG tablet Take 1,000 mg by mouth every 6 (six) hours as needed for mild pain.    ? albuterol (VENTOLIN HFA) 108 (90 Base) MCG/ACT inhaler Inhale 2 puffs into the lungs every 4 (four) hours as needed for wheezing. 1 each 2  ? apixaban (ELIQUIS) 5 MG TABS tablet Take 1 tablet (5 mg total) by mouth 2 (two) times daily. 180 tablet 1  ? atenolol (TENORMIN) 50 MG tablet Take 1 tablet (50 mg total) by mouth daily. 90 tablet 3  ? atorvastatin (LIPITOR) 80 MG tablet Take 1 tablet (80 mg total) by mouth daily. 90 tablet 3  ? B Complex Vitamins (B COMPLEX PO) Take 1 tablet by mouth daily.    ? blood glucose meter kit and supplies One Touch Ultra   Check 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 One Touch Ultra  Check blood sugars once daily Dx: E11.9) 1 each 0  ? buPROPion (WELLBUTRIN XL) 300 MG 24 hr tablet Take 1 tablet (300 mg total) by mouth daily. 90 tablet 2  ? EPINEPHrine 0.3 mg/0.3 mL IJ SOAJ injection Inject 0.3 mg into the muscle as needed for anaphylaxis. 1 each 1  ? ezetimibe (ZETIA) 10 MG tablet Take 1 tablet (10 mg total) by mouth daily. 90 tablet 0  ? fenofibrate 160 MG tablet Take 1 tablet (160 mg total) by mouth daily. (Patient taking differently: Take 160 mg by mouth every evening.) 90 tablet 3  ? flecainide (TAMBOCOR) 50 MG tablet Take 1 tablet (50 mg total) by mouth 2 (two) times daily. 180 tablet 3  ? Galcanezumab-gnlm (EMGALITY) 120 MG/ML SOAJ Inject 120 mg into the skin every 30 (thirty) days. 1.12 mL 2  ? glucose blood (ONETOUCH VERIO) test strip Use daily to check blood sugar. (Patient taking differently: 1 each by Other route as needed for other (see instruction). Use daily to check blood sugar.) 100 each  3  ? isosorbide mononitrate (IMDUR) 30 MG 24 hr tablet Take 0.5 tablets (15 mg total) by mouth daily. 45 tablet 3  ? levocetirizine (XYZAL) 5 MG tablet TAKE 1 TABLET EVERY EVENING 90 tablet 3  ? lisinopril (ZESTRIL) 5 MG tablet Take 1 tablet (5 mg total) by mouth daily. 90 tablet 3  ? Lurasidone HCl 120 MG TABS Take 1 tablet (120 mg total) by mouth at bedtime. 90 tablet 2  ? nitroGLYCERIN (NITROSTAT) 0.4 MG SL tablet Place 1 tablet (0.4 mg total) under the tongue every 5 (five) minutes as needed for chest pain. 25 tablet 11  ? ondansetron (ZOFRAN-ODT) 4 MG disintegrating tablet Take 1 tablet (4 mg total) by mouth every 8 (eight) hours as needed for nausea. 20 tablet 2  ? pantoprazole (PROTONIX) 40 MG tablet Take 1 tablet (40 mg total) by mouth 2 (two) times daily before a meal. 180 tablet 1  ? QUEtiapine (SEROQUEL) 300 MG tablet Take 1 tablet (300 mg total) by mouth  at bedtime. 90 tablet 2  ? topiramate (TOPAMAX) 50 MG tablet TAKE 3 TABLETS TWICE A DAY (Patient taking differently: Take 150 mg by mouth 2 (two) times daily.) 180 tablet 11  ? TRULICITY 1.5 HD/8.9BO SOPN [place]

## 2022-01-21 ENCOUNTER — Ambulatory Visit: Payer: Managed Care, Other (non HMO) | Admitting: Cardiology

## 2022-01-31 IMAGING — DX DG CHEST 2V
2 series · 2 of 2 positions shown · non-contrast
Comparison: None

CLINICAL DATA: Chest pain and central pressure without radiation

EXAM:
CHEST - 2 VIEW

[chest pa]
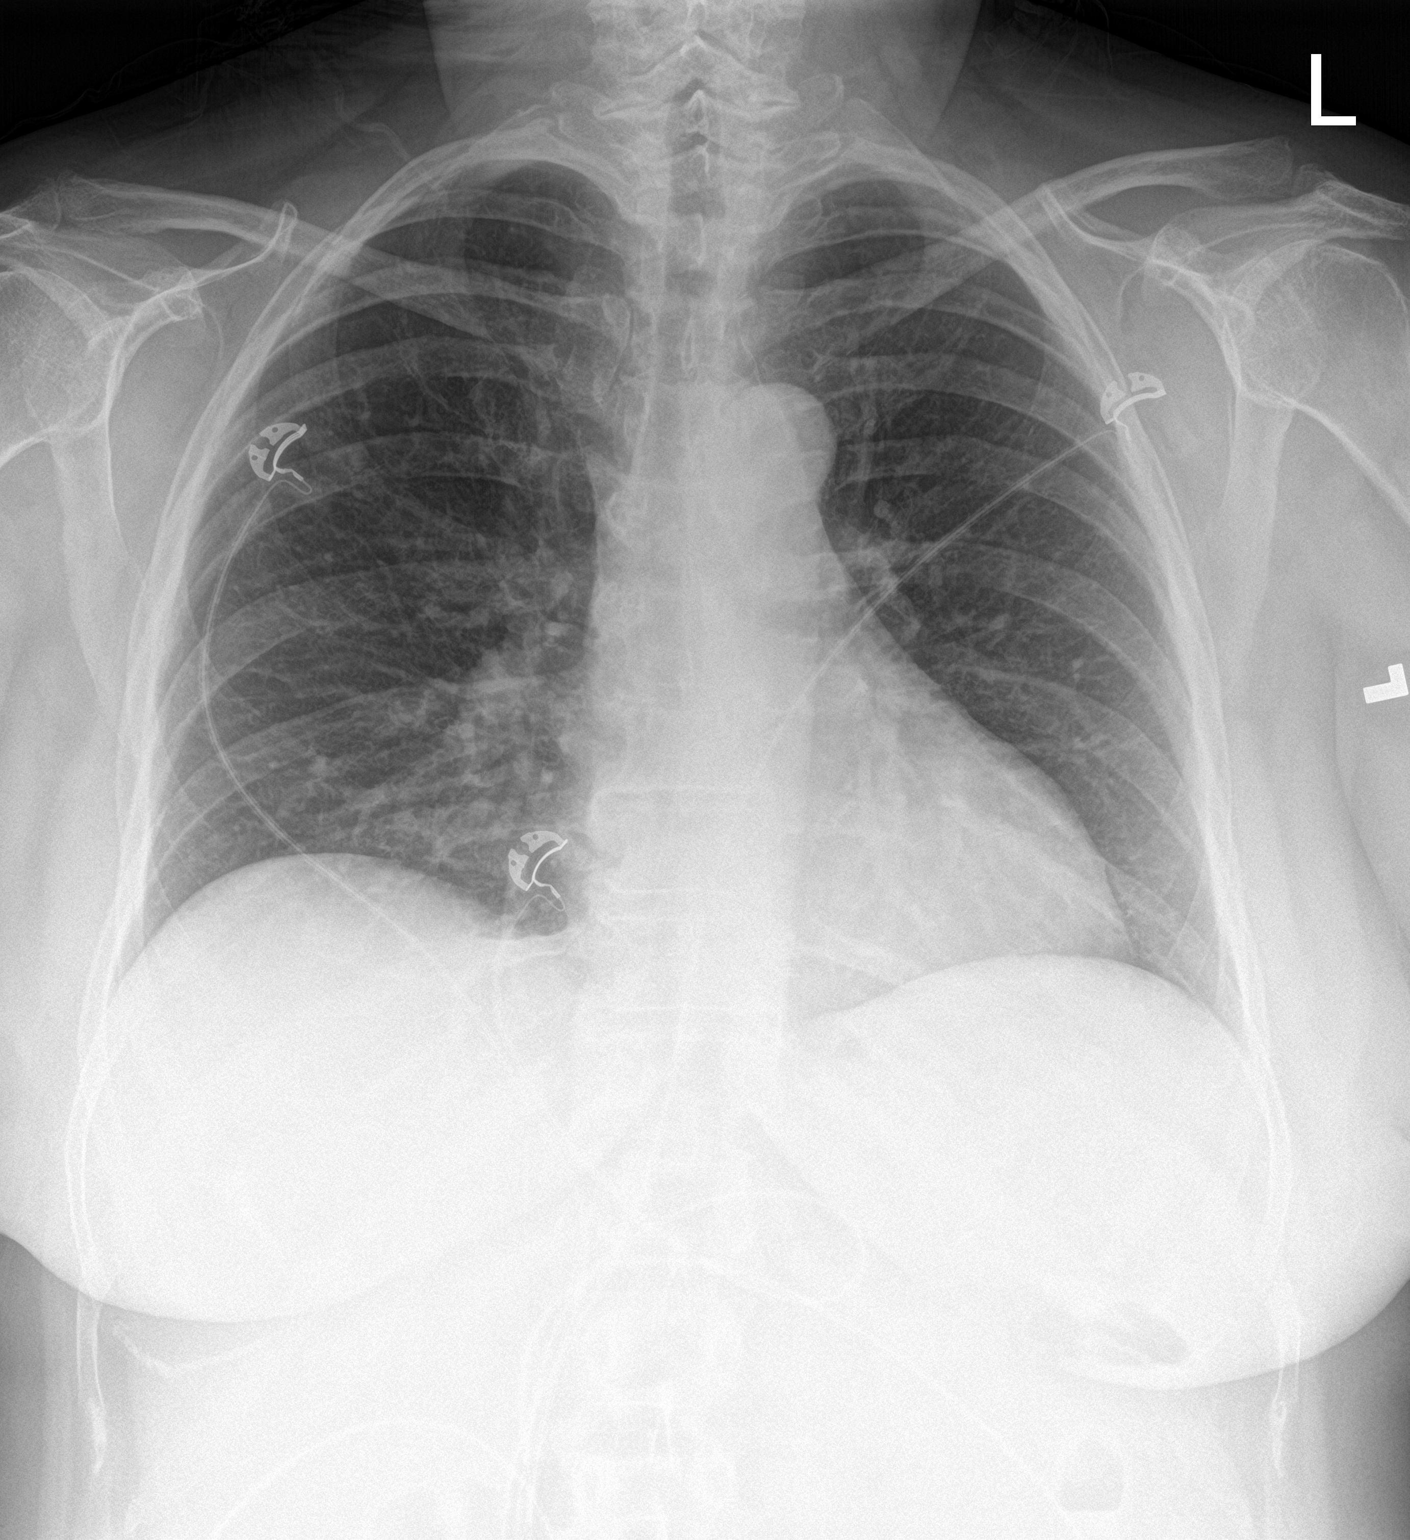

[chest lat]
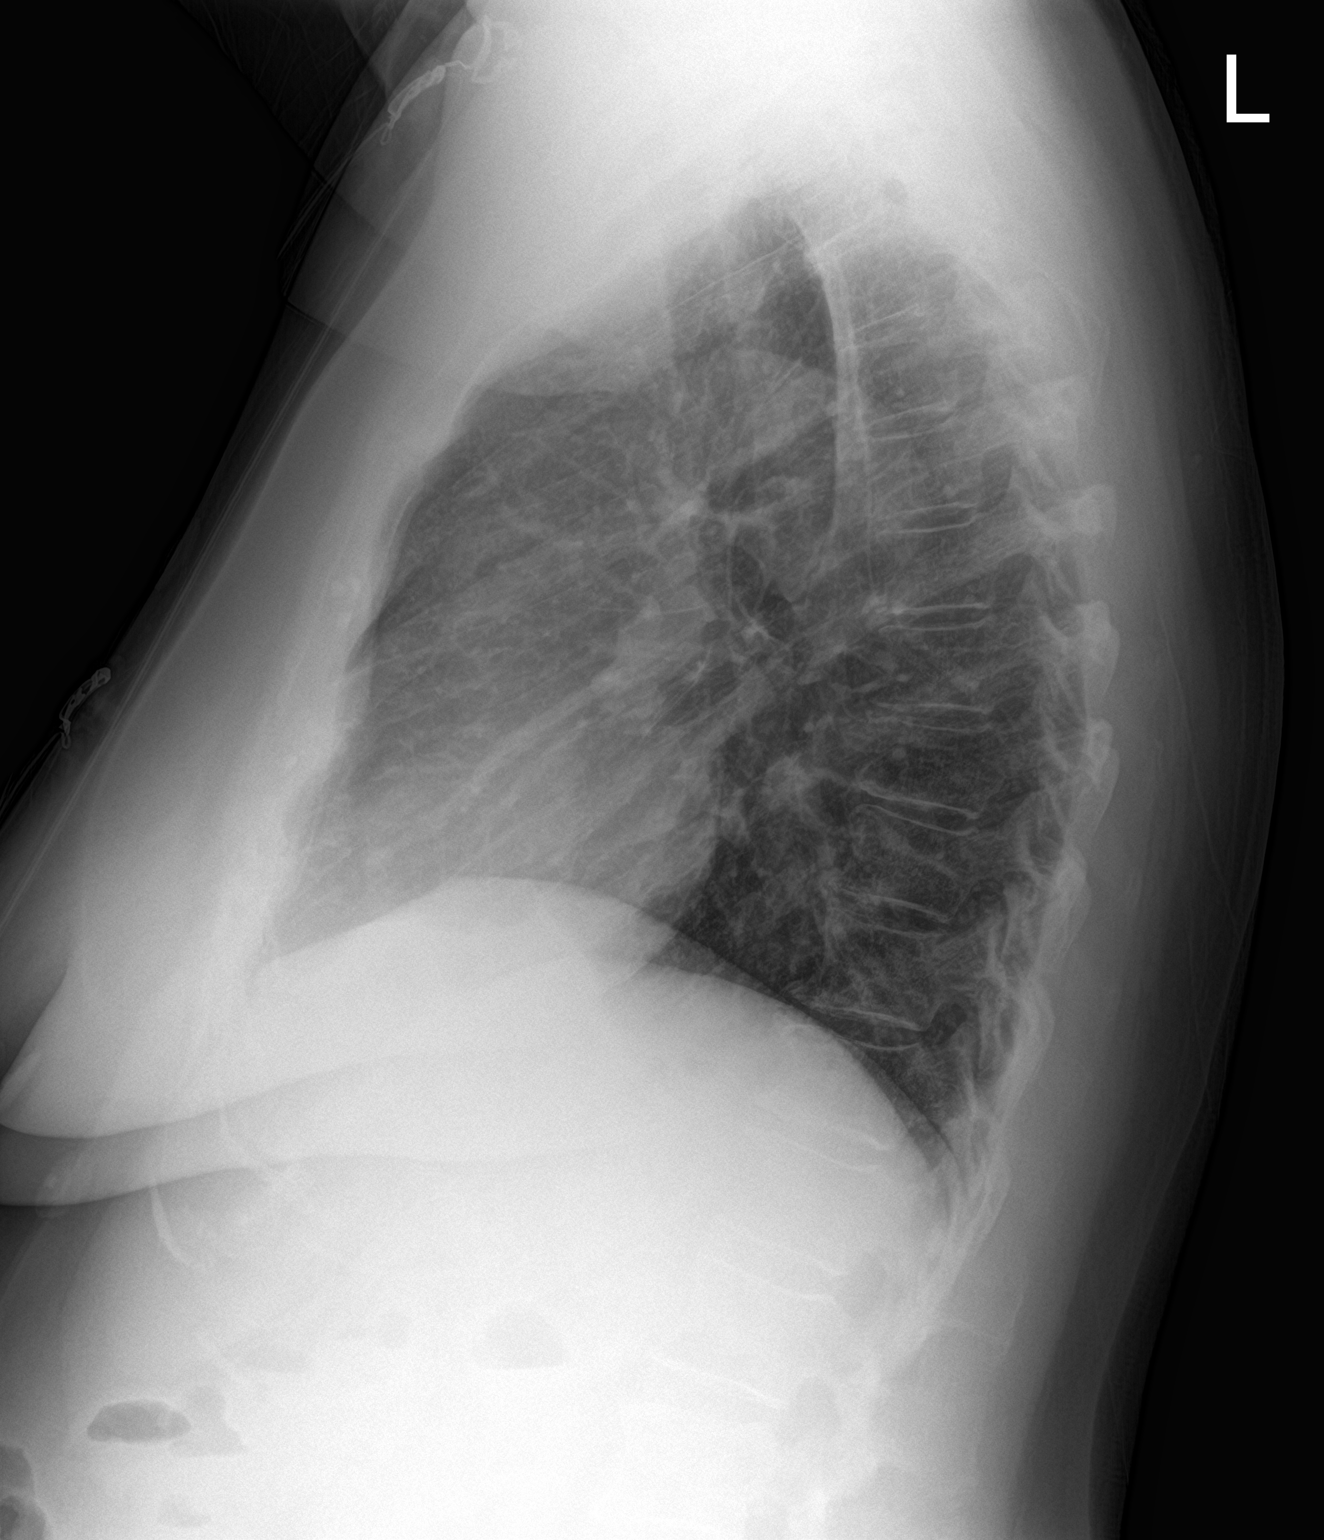

[2 of 2 positions shown; findings below may reference images not displayed]

FINDINGS: Slightly low lung volumes with some hazy opacity in the bases
favoring atelectasis with central vascular crowding. Focal
consolidative opacity. No convincing features of edema. No
pneumothorax or effusion. The cardiomediastinal contours are
unremarkable. No acute osseous or soft tissue abnormality.
Degenerative changes are present in the imaged spine and shoulders.
IMPRESSION: Low volumes and atelectasis without other acute cardiopulmonary
abnormality.

## 2022-02-04 ENCOUNTER — Ambulatory Visit (HOSPITAL_COMMUNITY): Payer: Self-pay | Admitting: Licensed Clinical Social Worker

## 2022-02-09 ENCOUNTER — Encounter: Payer: Self-pay | Admitting: Family Medicine

## 2022-02-09 DIAGNOSIS — F339 Major depressive disorder, recurrent, unspecified: Secondary | ICD-10-CM

## 2022-02-09 MED ORDER — BUPROPION HCL ER (XL) 300 MG PO TB24: 300.0000 mg | ORAL_TABLET | Freq: Every day | ORAL | 2 refills | Status: DC

## 2022-02-10 ENCOUNTER — Encounter: Payer: Self-pay | Admitting: Cardiology

## 2022-02-16 NOTE — Telephone Encounter (Signed)
Discussed monitor findings w/ pt. Aware Dr. Curt Bears recommends 55mo f/u. Patient verbalized understanding and agreeable to plan.

## 2022-02-22 ENCOUNTER — Ambulatory Visit: Payer: Commercial Managed Care - HMO | Admitting: Cardiology

## 2022-03-07 ENCOUNTER — Encounter: Payer: Self-pay | Admitting: Family Medicine

## 2022-03-08 MED ORDER — LISINOPRIL 5 MG PO TABS: 5.0000 mg | ORAL_TABLET | Freq: Every day | ORAL | 3 refills | Status: DC

## 2022-03-21 ENCOUNTER — Encounter: Payer: Self-pay | Admitting: Cardiology

## 2022-03-22 NOTE — Telephone Encounter (Signed)
Pt reports elevated HR episode on Saturday around 8:15/8:30 pm while lying in bed.   Watch said HRs 120s, lasted about 30 minutes. She felt like she had been "punched in the chest & couldn't catch my breath". This is same feeling she had back on March 2nd. She did head to the hospital for this, but on the way there it stopped and she could "feel the pressure let up" so she went back home. SBP at home avg 110s. She is aware MD is out of the office this week, but will review with him upon his return. Aware it may be next week before she gets return call about this, as it is not urgent at this time. Compliant w/ her Flecainide, Atenolol and Eliquis Advised to call if reoccurs before I call her back. Patient verbalized understanding and agreeable to plan.

## 2022-03-29 ENCOUNTER — Ambulatory Visit (HOSPITAL_COMMUNITY)
Admission: RE | Admit: 2022-03-29 | Discharge: 2022-03-29 | Disposition: A | Discharge status: Home / Self Care | Payer: Commercial Managed Care - HMO | Source: Ambulatory Visit | Attending: Nurse Practitioner | Admitting: Nurse Practitioner

## 2022-03-29 ENCOUNTER — Other Ambulatory Visit: Payer: Self-pay | Admitting: Family Medicine

## 2022-03-29 ENCOUNTER — Encounter (HOSPITAL_COMMUNITY): Payer: Self-pay | Admitting: Nurse Practitioner

## 2022-03-29 VITALS — BP 120/70 | HR 48 | Ht 66.0 in | Wt 222.6 lb

## 2022-03-29 DIAGNOSIS — I471 Supraventricular tachycardia: Secondary | ICD-10-CM | POA: Insufficient documentation

## 2022-03-29 DIAGNOSIS — E785 Hyperlipidemia, unspecified: Secondary | ICD-10-CM | POA: Diagnosis not present

## 2022-03-29 DIAGNOSIS — I480 Paroxysmal atrial fibrillation: Secondary | ICD-10-CM | POA: Diagnosis not present

## 2022-03-29 DIAGNOSIS — R9431 Abnormal electrocardiogram [ECG] [EKG]: Secondary | ICD-10-CM | POA: Insufficient documentation

## 2022-03-29 DIAGNOSIS — I440 Atrioventricular block, first degree: Secondary | ICD-10-CM | POA: Diagnosis not present

## 2022-03-29 DIAGNOSIS — R0602 Shortness of breath: Secondary | ICD-10-CM | POA: Diagnosis not present

## 2022-03-29 DIAGNOSIS — Z79899 Other long term (current) drug therapy: Secondary | ICD-10-CM | POA: Insufficient documentation

## 2022-03-29 DIAGNOSIS — I4891 Unspecified atrial fibrillation: Secondary | ICD-10-CM

## 2022-03-29 DIAGNOSIS — F319 Bipolar disorder, unspecified: Secondary | ICD-10-CM | POA: Insufficient documentation

## 2022-03-29 DIAGNOSIS — I129 Hypertensive chronic kidney disease with stage 1 through stage 4 chronic kidney disease, or unspecified chronic kidney disease: Secondary | ICD-10-CM | POA: Diagnosis not present

## 2022-03-29 DIAGNOSIS — R0789 Other chest pain: Secondary | ICD-10-CM | POA: Diagnosis not present

## 2022-03-29 DIAGNOSIS — I495 Sick sinus syndrome: Secondary | ICD-10-CM | POA: Diagnosis not present

## 2022-03-29 DIAGNOSIS — N183 Chronic kidney disease, stage 3 unspecified: Secondary | ICD-10-CM | POA: Insufficient documentation

## 2022-03-29 DIAGNOSIS — Z7901 Long term (current) use of anticoagulants: Secondary | ICD-10-CM | POA: Insufficient documentation

## 2022-03-29 DIAGNOSIS — R001 Bradycardia, unspecified: Secondary | ICD-10-CM | POA: Diagnosis not present

## 2022-03-29 DIAGNOSIS — E1122 Type 2 diabetes mellitus with diabetic chronic kidney disease: Secondary | ICD-10-CM | POA: Insufficient documentation

## 2022-03-29 MED ORDER — ATENOLOL 50 MG PO TABS: 25.0000 mg | ORAL_TABLET | Freq: Two times a day (BID) | ORAL | Status: DC

## 2022-03-29 NOTE — Progress Notes (Addendum)
Primary Care Physician: [name], DO Referring Physician: Dr. Altha Harm Flood is a 66 y.o. female with a h/o of DM, HLD, CKD (III), SVT, AFib (has hx of a CTI and PVI ablation Jan 2020 in [place]), obesity, Bipolar d/o, migraine HAs, HTN. June 2021 went to [facility] with palpitations and chest pain.  She was found to be in sinus rhythm on presentation.  Catheterization showed no evidence of coronary artery disease. Long standing h/o of atypical chest pain and shortness of breath.  She was seen in the ER in [place] in March and had a cardioversion for SVT.   She saw [name] in April and c/o palpitations. A monitor was in place. Renss stated in her note if monitor showed SVT and no afib then Dr. Curt Bears would consider ablation.  It  did not show  any  afib, only very short runs of SVT.   She wanted to  discuses monitor further,  referred by Dr. Curt Bears office after she requested an appointment with him to discuss. She had more rapid heart rate July 15th, started to the ER but it did slow and return to SR so she turned around. In SR she is very slow in the 40's with a first degree block so unable to uptitrate her BB. She is on flecainide and it seems to be keeping afib in check.     Today, she denies symptoms of palpitations, chest pain, shortness of breath, orthopnea, PND, lower extremity edema, dizziness, presyncope, syncope, or neurologic sequela. The patient is tolerating medications without difficulties and is otherwise without complaint today.   Past Medical History:  Diagnosis Date   A-fib [facility])    ACS (acute coronary syndrome) ([place]) 07/31/2021   Acute cystitis without hematuria 05/14/2017   Last Assessment & Plan:  Formatting of this note might be different from the original. - suprapubic tenderness with symptoms of urinary frequency and urgency and subjective fevers (no fevers recorded on any visit). Recently passed a renal stone, US shows left hydronephrosis which  is improving.  - UA negative8/24 but pt is clinically symptomatic - will repeat UA  - start pt on Microbid for 5 days   Adhesive capsulitis of left shoulder 12/16/2016   Anxiety    Asthma without status asthmaticus 02/06/2021   At risk for falls 02/04/2019   Atrial fibrillation ([place]) 09/01/2014   Last Assessment & Plan:  Formatting of this note might be different from the original. A:  Chronic.  Sinus rhythm at this time.  States she was told this during admission at [facility] in the past. P:  On baby aspirin at home will continue.  Low dose metoprolol started.   Bipolar 1 disorder, depressed, moderate ([place]) 02/18/2020   Capsulitis of left shoulder 10/20/2021   Chronic anticoagulation 02/20/2020   Chronic atrial fibrillation ([place]) 02/18/2020   Chronic headache 10/11/2013   Chronic interstitial cystitis 12/28/2005   Chronic migraine without aura, intractable, without status migrainosus 06/24/2017   Colon cancer screening 04/15/2016   Last Assessment & Plan:  Formatting of this note might be different from the original. Will schedule for colonoscopy   Depression, recurrent ([place]) 02/18/2020   Diabetes mellitus ([place])    Dyslipidemia 42/59/5638   Dysmetabolic syndrome X 75/64/3329   GAD (generalized anxiety disorder) 02/18/2020   GERD (gastroesophageal reflux disease) 11/05/2021   Heartburn 04/15/2016   Last Assessment & Plan:  Formatting of this note might be different from the original. Patient was counseled  regarding lifestyle modification  Advised to take Prilosec 30 mins before breakfast  Will schedule for EGD   History of atrial fibrillation 06/24/2017   History of posttraumatic stress disorder (PTSD) 10/07/2016   HLD (hyperlipidemia) 09/01/2014   Last Assessment & Plan:  Formatting of this note might be different from the original. A: Chronic. P: Continue statin.   Hyperosmolarity due to secondary diabetes mellitus ([place]) 02/06/2021   Hypertriglyceridemia 02/18/2020   Impaired mobility  and activities of daily living 11/14/2015   Irritable bowel syndrome (IBS)    Junctional tachycardia ([place]) 11/05/2021   Kidney stone 02/06/2021   Migraine without aura, not refractory 02/06/2021   Mixed hyperlipidemia 04/06/2021   Last Assessment & Plan:  Formatting of this note might be different from the original. Due for labs with PCP in November   Morbid obesity ([place]) 12/16/2016   Paroxysmal supraventricular tachycardia ([place]) 11/05/2021   Pneumonia, organism unspecified(486) 11/21/2002   Polyneuropathy due to type 2 diabetes mellitus ([place]) 02/06/2021   Primary hypertension 04/06/2021   Last Assessment & Plan:  Formatting of this note might be different from the original. Controlled   Refractory migraine with aura 02/06/2021   Refractory migraine without aura 86/57/8469   Renal colic on left side 62/95/2841   Stage 3 chronic kidney disease ([place]) 02/20/2020   Unspecified adverse effect of other drug, medicinal and biological substance(995.29) 02/17/2006   Urinary tract obstruction 02/06/2021   Past Surgical History:  Procedure Laterality Date   ABDOMINAL HYSTERECTOMY     Fibroids   ATRIAL FIBRILLATION ABLATION     CARDIAC CATHETERIZATION     CARDIOVERSION     CESAREAN SECTION     KNEE SURGERY     LEFT HEART CATH AND CORONARY ANGIOGRAPHY N/A 03/04/2020   Procedure: LEFT HEART CATH AND CORONARY ANGIOGRAPHY;  Surgeon: Belva Crome, MD;  Location: [facility];  Service: Cardiovascular;  Laterality: N/A;   ROTATOR CUFF REPAIR      Current Outpatient Medications  Medication Sig Dispense Refill   acetaminophen (TYLENOL) 500 MG tablet Take 1,000 mg by mouth every 6 (six) hours as needed for mild pain.     albuterol (VENTOLIN HFA) 108 (90 Base) MCG/ACT inhaler Inhale 2 puffs into the lungs every 4 (four) hours as needed for wheezing. 1 each 2   apixaban (ELIQUIS) 5 MG TABS tablet Take 1 tablet (5 mg total) by mouth 2 (two) times daily. 180 tablet 1   atorvastatin (LIPITOR) 80  MG tablet Take 1 tablet (80 mg total) by mouth daily. 90 tablet 3   B Complex Vitamins (B COMPLEX PO) Take 1 tablet by mouth daily.     buPROPion (WELLBUTRIN XL) 300 MG 24 hr tablet Take 1 tablet (300 mg total) by mouth daily. 90 tablet 2   diphenhydrAMINE (BENADRYL) 25 MG tablet Take 25 mg by mouth as needed.     EPINEPHrine 0.3 mg/0.3 mL IJ SOAJ injection Inject 0.3 mg into the muscle as needed for anaphylaxis. 1 each 1   ezetimibe (ZETIA) 10 MG tablet TAKE 1 TABLET DAILY 90 tablet 3   fenofibrate 160 MG tablet Take 1 tablet (160 mg total) by mouth daily. (Patient taking differently: Take 160 mg by mouth every evening.) 90 tablet 3   flecainide (TAMBOCOR) 50 MG tablet Take 1 tablet (50 mg total) by mouth 2 (two) times daily. 180 tablet 3   Galcanezumab-gnlm (EMGALITY) 120 MG/ML SOAJ Inject 120 mg into the skin every 30 (thirty) days. 1.12 mL 2  glucose blood (ONETOUCH VERIO) test strip Use daily to check blood sugar. (Patient taking differently: 1 each by Other route as needed for other (see instruction). Use daily to check blood sugar.) 100 each 3   isosorbide mononitrate (IMDUR) 30 MG 24 hr tablet Take 0.5 tablets (15 mg total) by mouth daily. 45 tablet 3   levocetirizine (XYZAL) 5 MG tablet TAKE 1 TABLET EVERY EVENING 90 tablet 3   lisinopril (ZESTRIL) 5 MG tablet Take 1 tablet (5 mg total) by mouth daily. 90 tablet 3   Lurasidone HCl 120 MG TABS Take 1 tablet (120 mg total) by mouth at bedtime. 90 tablet 2   nitroGLYCERIN (NITROSTAT) 0.4 MG SL tablet Place 1 tablet (0.4 mg total) under the tongue every 5 (five) minutes as needed for chest pain. 25 tablet 11   ondansetron (ZOFRAN-ODT) 4 MG disintegrating tablet Take 1 tablet (4 mg total) by mouth every 8 (eight) hours as needed for nausea. 20 tablet 2   pantoprazole (PROTONIX) 40 MG tablet Take 1 tablet (40 mg total) by mouth 2 (two) times daily before a meal. 180 tablet 1   QUEtiapine (SEROQUEL) 300 MG tablet Take 1 tablet (300 mg total) by  mouth at bedtime. 90 tablet 2   topiramate (TOPAMAX) 50 MG tablet TAKE 3 TABLETS TWICE A DAY (Patient taking differently: Take 150 mg by mouth 2 (two) times daily.) 903 tablet 11   TRULICITY 1.5 ES/9.2ZR SOPN Inject 1.5 mg into the skin as directed once a week. 6 mL 1   Ubrogepant 100 MG TABS Take 100 mg by mouth daily as needed for migraine.     VITAMIN D PO Take 1 tablet by mouth daily.     atenolol (TENORMIN) 50 MG tablet Take 0.5 tablets (25 mg total) by mouth 2 (two) times daily.     blood glucose meter kit and supplies One Touch Ultra  Check 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 One Touch Ultra  Check blood sugars once daily Dx: E11.9) 1 each 0   No current facility-administered medications for this encounter.    Allergies  Allergen Reactions   Bee Venom Anaphylaxis   Onion Other (See Comments)    Respiratory Distress   Tetanus-Diphtheria Toxoids Td Anaphylaxis   Sumatriptan     Passed out, nose bleed    Asa [Aspirin] Nausea And Vomiting   Nsaids Nausea Only    Rash, hives and trouble breathing   Iodine Rash   Sulfa Antibiotics Rash   Vancomycin Rash    Social History   Socioeconomic History   Marital status: Widowed    Spouse name: Not on file   Number of children: 6   Years of education: Not on file   Highest education level: Not on file  Occupational History   Not on file  Tobacco Use   Smoking status: Never   Smokeless tobacco: Never  Vaping Use   Vaping Use: Never used  Substance and Sexual Activity   Alcohol use: Never   Drug use: Never   Sexual activity: Not on file  Other Topics Concern   Not on file  Social History Narrative   Not on file   Social Determinants of Health   Financial Resource Strain: Not on file  Food Insecurity: Not on file  Transportation Needs: Not on file  Physical Activity: Not on file  Stress: Not on file  Social Connections: Not on file  Intimate  Partner Violence: Not on file    Family History  Adopted: Yes  Problem Relation Age of Onset   Bipolar disorder Son    Bipolar disorder Daughter     ROS- All systems are reviewed and negative except as per the HPI above  Physical Exam: Vitals:   03/29/22 1423  BP: 120/70  Pulse: (!) 48  Weight: 101 kg  Height: '5\' 6"'  (1.676 m)   Wt Readings from Last 3 Encounters:  03/29/22 101 kg  01/18/22 98.4 kg  01/12/22 98.2 kg    Labs: Lab Results  Component Value Date   NA 139 12/15/2021   K 4.1 12/15/2021   CL 111 12/15/2021   CO2 20 (L) 12/15/2021   GLUCOSE 110 (H) 12/15/2021   BUN 23 12/15/2021   CREATININE 1.97 (H) 12/15/2021   CALCIUM 9.5 12/15/2021   MG 1.6 (L) 12/15/2021   No results found for: "INR" Lab Results  Component Value Date   CHOL 123 12/15/2021   HDL 23.30 (L) 12/15/2021   LDLCALC 66 12/15/2021   TRIG 169.0 (H) 12/15/2021     GEN- The patient is well appearing, alert and oriented x 3 today.   Head- normocephalic, atraumatic Eyes-  Sclera clear, conjunctiva pink Ears- hearing intact Oropharynx- clear Neck- supple, no JVP Lymph- no cervical lymphadenopathy Lungs- Clear to ausculation bilaterally, normal work of breathing Heart- Slow regular rate and rhythm, no murmurs, rubs or gallops, PMI not laterally displaced GI- soft, NT, ND, + BS Extremities- no clubbing, cyanosis, or edema MS- no significant deformity or atrophy Skin- no rash or lesion Psych- euthymic mood, full affect Neuro- strength and sensation are intact  EKG-Vent. rate 48 BPM PR interval 216 ms QRS duration 94 ms QT/QTcB 430/384 ms P-R-T axes 50 -27 9 Sinus bradycardia with 1st degree A-V block Cannot rule out Anterior infarct , age undetermined Abnormal ECG When compared with ECG of 05-Nov-2021 17:29, PREVIOUS ECG IS PRESENT  Monitor- Patch Wear Time:  14 days and 0 hours (2023-05-09T11:43:20-0400 to 2023-05-23T11:43:24-0400)   Patient had a min HR of 39 bpm, max HR of  120 bpm, and avg HR of 62 bpm. Predominant underlying rhythm was Sinus Rhythm. 2 Supraventricular Tachycardia runs occurred, the run with the fastest interval lasting 7 beats with a max rate of 120 bpm, the  longest lasting 8 beats with an avg rate of 97 bpm. Isolated SVEs were rare (<1.0%), SVE Couplets were rare (<1.0%), and SVE Triplets were rare (<1.0%). Isolated VEs were rare (<1.0%), and no VE Couplets or VE Triplets were present. Ventricular Trigeminy  was present.    Summary and conclusions:  Short episodes of SVT, rest normal, no need to treat  Assessment and Plan:  1. Afib/SVT Afib seems to be quiet with flecainide on board but still having episodes of SVT which are symptomatic for the pt Dr. Curt Bears had mentioned possibility of EP study to determine source of SVT and ablation  Continue 50 mg flecainide  bid  Continue eliquis 5 mg bid for a CHA2DS2VASc of least 3 ( DM, female, will be 66 yo  09/20/22)  2. Bradycardia/ first degree av block /tachy/brady syndrome  HR 48 bpm with first degree block  on EKG today She  occassionally will get lightheaded with this HR There is no room to increase rate control for spells of tachy  For now continue atenolol 12.5 mg bid  ? Need for PPM   I will refer back to Dr. Curt Bears to discuss above issues  Geroge Baseman Tanishka Drolet, [facility] [address] [place], [place] [address] [phone]

## 2022-04-01 ENCOUNTER — Other Ambulatory Visit: Payer: Self-pay | Admitting: Family Medicine

## 2022-04-01 MED ORDER — TRULICITY 1.5 MG/0.5ML ~~LOC~~ SOAJ
1.5000 mg | SUBCUTANEOUS | 1 refills | Status: DC
Start: 2022-04-01 — End: 2022-05-25

## 2022-04-02 ENCOUNTER — Encounter: Payer: Self-pay | Admitting: Cardiology

## 2022-04-02 ENCOUNTER — Other Ambulatory Visit: Payer: Self-pay

## 2022-04-02 DIAGNOSIS — I480 Paroxysmal atrial fibrillation: Secondary | ICD-10-CM

## 2022-04-02 MED ORDER — APIXABAN 5 MG PO TABS: 5.0000 mg | ORAL_TABLET | Freq: Two times a day (BID) | ORAL | 1 refills | Status: DC

## 2022-04-02 NOTE — Telephone Encounter (Signed)
Prescription refill request for Eliquis received. Indication:Afib  Last office visit: 03/29/22 Paula Stout)  Scr: 1.97 (12/15/21) Age: 66 Weight: 101kg  Appropriate dose and refill sent to requested pharmacy.

## 2022-04-11 ENCOUNTER — Emergency Department (HOSPITAL_BASED_OUTPATIENT_CLINIC_OR_DEPARTMENT_OTHER)
Admission: EM | Admit: 2022-04-11 | Discharge: 2022-04-12 | Disposition: A | Discharge status: Home / Self Care | Payer: Commercial Managed Care - HMO | Attending: Emergency Medicine | Admitting: Emergency Medicine

## 2022-04-11 ENCOUNTER — Other Ambulatory Visit: Payer: Self-pay

## 2022-04-11 ENCOUNTER — Encounter (HOSPITAL_BASED_OUTPATIENT_CLINIC_OR_DEPARTMENT_OTHER): Payer: Self-pay | Admitting: Emergency Medicine

## 2022-04-11 DIAGNOSIS — E1122 Type 2 diabetes mellitus with diabetic chronic kidney disease: Secondary | ICD-10-CM | POA: Insufficient documentation

## 2022-04-11 DIAGNOSIS — N183 Chronic kidney disease, stage 3 unspecified: Secondary | ICD-10-CM | POA: Diagnosis not present

## 2022-04-11 DIAGNOSIS — J45909 Unspecified asthma, uncomplicated: Secondary | ICD-10-CM | POA: Diagnosis not present

## 2022-04-11 DIAGNOSIS — G43009 Migraine without aura, not intractable, without status migrainosus: Secondary | ICD-10-CM

## 2022-04-11 DIAGNOSIS — Z7901 Long term (current) use of anticoagulants: Secondary | ICD-10-CM | POA: Insufficient documentation

## 2022-04-11 DIAGNOSIS — G43909 Migraine, unspecified, not intractable, without status migrainosus: Secondary | ICD-10-CM | POA: Insufficient documentation

## 2022-04-11 DIAGNOSIS — I4891 Unspecified atrial fibrillation: Secondary | ICD-10-CM | POA: Insufficient documentation

## 2022-04-11 DIAGNOSIS — E1143 Type 2 diabetes mellitus with diabetic autonomic (poly)neuropathy: Secondary | ICD-10-CM | POA: Insufficient documentation

## 2022-04-11 DIAGNOSIS — R519 Headache, unspecified: Secondary | ICD-10-CM | POA: Diagnosis present

## 2022-04-11 MED ORDER — PROCHLORPERAZINE EDISYLATE 10 MG/2ML IJ SOLN
10.0000 mg | Freq: Once | INTRAMUSCULAR | Status: AC
  Administered 2022-04-11: 10 mg via INTRAVENOUS
  Filled 2022-04-11: qty 2

## 2022-04-11 MED ORDER — DIPHENHYDRAMINE HCL 50 MG/ML IJ SOLN
50.0000 mg | Freq: Once | INTRAMUSCULAR | Status: AC
  Administered 2022-04-11: 50 mg via INTRAVENOUS
  Filled 2022-04-11: qty 1

## 2022-04-11 MED ORDER — HYDROMORPHONE HCL 1 MG/ML IJ SOLN
1.0000 mg | Freq: Once | INTRAMUSCULAR | Status: AC
  Administered 2022-04-11: 1 mg via INTRAVENOUS
  Filled 2022-04-11: qty 1

## 2022-04-11 MED ORDER — SODIUM CHLORIDE 0.9 % IV BOLUS
1000.0000 mL | Freq: Once | INTRAVENOUS | Status: AC
  Administered 2022-04-11: 1000 mL via INTRAVENOUS

## 2022-04-11 NOTE — ED Triage Notes (Signed)
Pt arrives pov, steady gait, c/o migraine with n/v x 3 days, hx of same. RX meds not helping

## 2022-04-11 NOTE — ED Provider Notes (Signed)
[facility] EMERGENCY DEPARTMENT Provider Note   CSN: [id number] Arrival date & time: 04/11/22  1833     History  Chief Complaint  Patient presents with   Migraine    Paula Stout is a 66 y.o. female chronic atrial fibrillation (on Eliquis), diabetes mellitus, CKD stage III, asthma, polyneuropathy, morbid obesity, bipolar 1 disorder, depression, migraine.  Presents to the emergency department with a chief complaint of migraine headache.  Patient reports that headache has been present over the last 2 days.  Patient states that she woke up with a headache which is normally how her migraines start.  Pain has been constant since then.  Pain is located to her occipital region and frontal temporal aspect.  Patient rates pain 8/10 on the pain scale.  Patient endorses photophobia.  Patient reports that she took all of her prescribed medications for migraines with no improvement in pain.  Patient states that she is taking her Emgality as prescribed.  Patient reports that headache pattern is consistent with previous migraine she has had in the past.  Patient denies any recent traumatic head injuries.  Denies any numbness, weakness, facial asymmetry, dysarthria, visual disturbance, syncope, seizures.    Migraine Associated symptoms include headaches. Pertinent negatives include no chest pain, no abdominal pain and no shortness of breath.       Home Medications Prior to Admission medications   Medication Sig Start Date End Date Taking? Authorizing Provider  acetaminophen (TYLENOL) 500 MG tablet Take 1,000 mg by mouth every 6 (six) hours as needed for mild pain.    [provider]  albuterol (VENTOLIN HFA) 108 (90 Base) MCG/ACT inhaler Inhale 2 puffs into the lungs every 4 (four) hours as needed for wheezing. 06/23/20   Wendling, Crosby Oyster, DO  apixaban (ELIQUIS) 5 MG TABS tablet Take 1 tablet (5 mg total) by mouth 2 (two) times daily. 04/02/22   Camnitz, Ocie Doyne, MD   atenolol (TENORMIN) 50 MG tablet Take 0.5 tablets (25 mg total) by mouth 2 (two) times daily. 03/29/22   Sherran Needs, NP  atorvastatin (LIPITOR) 80 MG tablet Take 1 tablet (80 mg total) by mouth daily. 12/07/21   [name], DO  B Complex Vitamins (B COMPLEX PO) Take 1 tablet by mouth daily.    [provider]  blood glucose meter kit and supplies One Touch Ultra  Check blood sugars once daily Dx: E11.9 Patient taking differently: 1 each by Other route See admin instructions. One Touch Ultra  Check blood sugars once daily Dx: E11.9 10/01/21   [name], DO  buPROPion (WELLBUTRIN XL) 300 MG 24 hr tablet Take 1 tablet (300 mg total) by mouth daily. 02/09/22   [name], DO  diphenhydrAMINE (BENADRYL) 25 MG tablet Take 25 mg by mouth as needed.    [provider]  EMGALITY 120 MG/ML SOAJ INJECT THE CONTENTS OF ONE PEN UNDER THE SKIN EVERY MONTH 04/01/22   [name], DO  EPINEPHrine 0.3 mg/0.3 mL IJ SOAJ injection Inject 0.3 mg into the muscle as needed for anaphylaxis. 11/17/20   [name], DO  ezetimibe (ZETIA) 10 MG tablet TAKE 1 TABLET DAILY 03/29/22   [name], DO  fenofibrate 160 MG tablet Take 1 tablet (160 mg total) by mouth daily. Patient taking differently: Take 160 mg by mouth every evening. 09/22/21   Revankar, Reita Cliche, MD  flecainide (TAMBOCOR) 50 MG tablet Take 1 tablet (50 mg total) by mouth 2 (two)  times daily. 09/22/21   Revankar, Rajan R, MD  glucose blood (ONETOUCH VERIO) test strip Use daily to check blood sugar. Patient taking differently: 1 each by Other route as needed for other (see instruction). Use daily to check blood sugar. 10/14/21   Wendling, Nicholas Paul, DO  isosorbide mononitrate (IMDUR) 30 MG 24 hr tablet Take 0.5 tablets (15 mg total) by mouth daily. 12/09/21 03/29/22  Ursuy, [name] Lynn, PA-C  levocetirizine (XYZAL) 5 MG tablet TAKE 1 TABLET EVERY EVENING 01/18/22   Wendling,  Nicholas Paul, DO  lisinopril (ZESTRIL) 5 MG tablet Take 1 tablet (5 mg total) by mouth daily. 03/08/22   Wendling, Nicholas Paul, DO  Lurasidone HCl 120 MG TABS Take 1 tablet (120 mg total) by mouth at bedtime. 11/30/21   Wendling, Nicholas Paul, DO  nitroGLYCERIN (NITROSTAT) 0.4 MG SL tablet Place 1 tablet (0.4 mg total) under the tongue every 5 (five) minutes as needed for chest pain. 11/27/21   Krasowski, Robert J, MD  ondansetron (ZOFRAN-ODT) 4 MG disintegrating tablet Take 1 tablet (4 mg total) by mouth every 8 (eight) hours as needed for nausea. 10/20/21   Wendling, Nicholas Paul, DO  pantoprazole (PROTONIX) 40 MG tablet Take 1 tablet (40 mg total) by mouth 2 (two) times daily before a meal. 12/15/21   Wendling, Nicholas Paul, DO  QUEtiapine (SEROQUEL) 300 MG tablet Take 1 tablet (300 mg total) by mouth at bedtime. 11/30/21   Wendling, Nicholas Paul, DO  topiramate (TOPAMAX) 50 MG tablet TAKE 3 TABLETS TWICE A DAY Patient taking differently: Take 150 mg by mouth 2 (two) times daily. 12/29/21   Wendling, Nicholas Paul, DO  TRULICITY 1.5 MG/0.5ML SOPN Inject 1.5 mg into the skin as directed once a week. 04/01/22   Wendling, Nicholas Paul, DO  Ubrogepant 100 MG TABS Take 100 mg by mouth daily as needed for migraine.    [provider]  VITAMIN D PO Take 1 tablet by mouth daily.    [provider]      Allergies    Bee venom, Onion, Tetanus-diphtheria toxoids td, Sumatriptan, Asa [aspirin], Nsaids, Iodine, Sulfa antibiotics, and Vancomycin    Review of Systems   Review of Systems  Constitutional:  Negative for chills and fever.  Eyes:  Positive for photophobia. Negative for visual disturbance.  Respiratory:  Negative for shortness of breath.   Cardiovascular:  Negative for chest pain.  Gastrointestinal:  Negative for abdominal pain, nausea and vomiting.  Musculoskeletal:  Negative for back pain and neck pain.  Skin:  Negative for color change and rash.  Neurological:  Positive  for headaches. Negative for dizziness, tremors, seizures, syncope, facial asymmetry, speech difficulty, weakness, light-headedness and numbness.  Psychiatric/Behavioral:  Negative for confusion.     Physical Exam Updated Vital Signs BP (!) 138/97 (BP Location: Left Arm)   Pulse 63   Temp 98 F (36.7 C) (Oral)   Resp 18   Wt 95.3 kg   SpO2 100%   BMI 33.89 kg/m  Physical Exam Vitals and nursing note reviewed.  Constitutional:      General: She is not in acute distress.    Appearance: She is not ill-appearing, toxic-appearing or diaphoretic.  Eyes:     General: No visual field deficit.       Right eye: No discharge.        Left eye: No discharge.     Extraocular Movements: Extraocular movements intact.     Conjunctiva/sclera: Conjunctivae normal.     Pupils: Pupils   are equal, round, and reactive to light.  Cardiovascular:     Rate and Rhythm: Normal rate.  Pulmonary:     Effort: Pulmonary effort is normal.  Musculoskeletal:     Cervical back: Normal range of motion and neck supple. No rigidity.  Skin:    General: Skin is warm and dry.  Neurological:     General: No focal deficit present.     Mental Status: She is alert and oriented to person, place, and time.     GCS: GCS eye subscore is 4. GCS verbal subscore is 5. GCS motor subscore is 6.     Cranial Nerves: No cranial nerve deficit, dysarthria or facial asymmetry.     Sensory: Sensation is intact.     Motor: No weakness, tremor or seizure activity.     Coordination: Finger-Nose-Finger Test normal.     Gait: Gait is intact. Gait normal.     Comments: CN II-XII intact, equal grip strength, +5 strength to bilateral upper and lower extremities    Psychiatric:        Behavior: Behavior is cooperative.     ED Results / Procedures / Treatments   Labs (all labs ordered are listed, but only abnormal results are displayed) Labs Reviewed - No data to display  EKG None  Radiology No results  found.  Procedures Procedures    Medications Ordered in ED Medications - No data to display  ED Course/ Medical Decision Making/ A&P                           Medical Decision Making Risk Prescription drug management.   Alert 66 year old female in no acute distress, nontoxic-appearing.  Presents to the ED with a complaint of migraine.  Information was obtained from patient.  I reviewed patient's past medical records including previous prior notes, labs, and imaging.  Patient has medical history as outlined in HPI which complicates her care.  Per chart review patient is followed by [facility] patient is on Emgality injection for migraines.  Patient had an MRI which showed no acute abnormality 11/2020.  Neuro exam is reassuring.  Patient reports that headache onset and pattern are consistent with previous migraine she has had in the past.  Patient denies any sudden onset of symptoms.  No recent head trauma.  Low suspicion for subarachnoid hemorrhage, TIA, or CVA at this time.  Will treat patient with Compazine, Benadryl, and 1 L fluid bolus.  Plan to reassess after receiving this medication.  Patient reported continued headache after receiving migraine cocktail.  We will give patient Dilaudid for pain management as this has worked for her migraine headaches in the past.  After receiving Dilaudid patient does have improvement in her headache.  Headache is not completely resolved however she states that it is "manageable."  Patient continues to have no focal neurological deficits we will discharge patient at this time to follow-up closely with her neurologist.    Based on patient's chief complaint, I considered admission might be necessary, however after reassuring ED workup feel patient is reasonable for discharge.  Discussed results, findings, treatment and follow up. Patient advised of return precautions. Patient verbalized understanding and agreed with  plan.  Portions of this note were generated with [employer]. Dictation errors may occur despite best attempts at proofreading.         Final Clinical Impression(s) / ED Diagnoses Final diagnoses:  Migraine without aura and without status  migrainosus, not intractable    Rx / DC Orders ED Discharge Orders     None         ,  R, PA-C 04/11/22 2321    Nanavati, Ankit, MD 04/12/22 1541  

## 2022-04-11 NOTE — Discharge Instructions (Signed)
[facility] to be evaluated for your headache.  Your physical exam was reassuring.  Your symptoms were improved after receiving medication in the emergency department.  Please follow-up closely with your neurologist for further management of your migraines.  Get help right away if: Your migraine headache becomes severe. Your migraine headache lasts longer than 72 hours. You have a fever. You have a stiff neck. You have vision loss. Your muscles feel weak or like you cannot control them. You start to lose your balance often. You have trouble walking. You faint. You have a seizure.

## 2022-04-12 ENCOUNTER — Encounter: Payer: Self-pay | Admitting: Cardiology

## 2022-04-12 ENCOUNTER — Ambulatory Visit: Payer: Commercial Managed Care - HMO | Admitting: Cardiology

## 2022-04-12 VITALS — BP 92/70 | HR 60 | Ht 66.0 in | Wt 221.1 lb

## 2022-04-12 DIAGNOSIS — I471 Supraventricular tachycardia: Secondary | ICD-10-CM

## 2022-04-12 MED ORDER — FLECAINIDE ACETATE 100 MG PO TABS: 100.0000 mg | ORAL_TABLET | Freq: Two times a day (BID) | ORAL | 3 refills | Status: DC

## 2022-04-12 NOTE — Patient Instructions (Signed)
Medication Instructions:  Your physician has recommended you make the following change in your medication:   Increase Flecainide to 100mg  - 1 tablet by mouth twice daily  *If you need a refill on your cardiac medications before your next appointment, please call your pharmacy*   Lab Work: None ordered.  If you have labs (blood work) drawn today and your tests are completely normal, you will receive your results only by: [place] (if you have MyChart) OR A paper copy in the mail If you have any lab test that is abnormal or we need to change your treatment, we will call you to review the results.   Testing/Procedures: None ordered.    Follow-Up: At [facility], you and your health needs are our priority.  As part of our continuing mission to provide you with exceptional heart care, we have created designated Provider Care Teams.  These Care Teams include your primary Cardiologist (physician) and Advanced Practice Providers (APPs -  Physician Assistants and Nurse Practitioners) who all work together to provide you with the care you need, when you need it.  We recommend signing up for the patient portal called "MyChart".  Sign up information is provided on this After Visit Summary.  MyChart is used to connect with patients for Virtual Visits (Telemedicine).  Patients are able to view lab/test results, encounter notes, upcoming appointments, etc.  Non-urgent messages can be sent to your provider as well.   To learn more about what you can do with MyChart, go to [email or web address].    Your next appointment:   EKG in 2 weeks at [facility] office in [place]  3 month follow up with [name]  Important Information About Sugar

## 2022-04-12 NOTE — Progress Notes (Signed)
Electrophysiology Office Note   Date:  04/12/2022   ID:  Paula, Stout [birth year], MRN [id number]  PCP:  [name], DO  Cardiologist: Revankar Primary Electrophysiologist:  Taos Tapp [name], MD    Chief Complaint: Atrial fibrillation   History of Present Illness: Paula Stout is a 66 y.o. female who is being seen today for the evaluation of atrial fibrillation at the request of [name]*. Presenting today for electrophysiology evaluation.  She has a history significant for paroxysmal atrial fibrillation, type 2 diabetes, hyperlipidemia, stage III CKD.  She is post ablation with PVI and CTI in [place] in 09/27/2018.  She presented to the emergency room 11/06/2021 with palpitations and was found to have SVT.  She was given adenosine which converted to sinus rhythm.  She wore a cardiac monitor that showed no evidence of atrial fibrillation but multiple short runs of SVT.  Today, denies symptoms of palpitations, chest pain, shortness of breath, orthopnea, PND, lower extremity edema, claudication, dizziness, presyncope, syncope, bleeding, or neurologic sequela. The patient is tolerating medications without difficulties.  She is continue to have episodic palpitations.  She wore a cardiac monitor that showed no evidence of atrial fibrillation and only short runs of SVT.  She does state that she continues to have episodes of SVT which makes her feel quite poorly.  She brings an Apple watch recordings that show both sinus rhythm and episodes of narrow complex tachycardia with rates between 110 and 120.   Past Medical History:  Diagnosis Date   A-fib [facility])    ACS (acute coronary syndrome) ([place]) 07/31/2021   Acute cystitis without hematuria 05/14/2017   Last Assessment & Plan:  Formatting of this note might be different from the original. - suprapubic tenderness with symptoms of urinary frequency and urgency and subjective fevers (no fevers recorded on any  visit). Recently passed a renal stone, US shows left hydronephrosis which is improving.  - UA negative8/24 but pt is clinically symptomatic - Ethanjames Fontenot repeat UA  - start pt on Microbid for 5 days   Adhesive capsulitis of left shoulder 12/16/2016   Anxiety    Asthma without status asthmaticus 02/06/2021   At risk for falls 02/04/2019   Atrial fibrillation ([place]) 09/01/2014   Last Assessment & Plan:  Formatting of this note might be different from the original. A:  Chronic.  Sinus rhythm at this time.  States she was told this during admission at [facility] in the past. P:  On baby aspirin at home Keidy Thurgood continue.  Low dose metoprolol started.   Bipolar 1 disorder, depressed, moderate ([place]) 02/18/2020   Capsulitis of left shoulder 10/20/2021   Chronic anticoagulation 02/20/2020   Chronic atrial fibrillation ([place]) 02/18/2020   Chronic headache 10/11/2013   Chronic interstitial cystitis 12/28/2005   Chronic migraine without aura, intractable, without status migrainosus 06/24/2017   Colon cancer screening 04/15/2016   Last Assessment & Plan:  Formatting of this note might be different from the original. Marshell Rieger schedule for colonoscopy   Depression, recurrent ([place]) 02/18/2020   Diabetes mellitus ([place])    Dyslipidemia 43/27/6147   Dysmetabolic syndrome X 06/04/5746   GAD (generalized anxiety disorder) 02/18/2020   GERD (gastroesophageal reflux disease) 11/05/2021   Heartburn 04/15/2016   Last Assessment & Plan:  Formatting of this note might be different from the original. Patient was counseled regarding lifestyle modification  Advised to take Prilosec 30 mins before breakfast  Tristin Gladman schedule for EGD   History of atrial fibrillation 06/24/2017  History of posttraumatic stress disorder (PTSD) 10/07/2016   HLD (hyperlipidemia) 09/01/2014   Last Assessment & Plan:  Formatting of this note might be different from the original. A: Chronic. P: Continue statin.   Hyperosmolarity due to secondary diabetes mellitus  ([place]) 02/06/2021   Hypertriglyceridemia 02/18/2020   Impaired mobility and activities of daily living 11/14/2015   Irritable bowel syndrome (IBS)    Junctional tachycardia ([place]) 11/05/2021   Kidney stone 02/06/2021   Migraine without aura, not refractory 02/06/2021   Mixed hyperlipidemia 04/06/2021   Last Assessment & Plan:  Formatting of this note might be different from the original. Due for labs with PCP in November   Morbid obesity ([place]) 12/16/2016   Paroxysmal supraventricular tachycardia ([place]) 11/05/2021   Pneumonia, organism unspecified(486) 11/21/2002   Polyneuropathy due to type 2 diabetes mellitus ([place]) 02/06/2021   Primary hypertension 04/06/2021   Last Assessment & Plan:  Formatting of this note might be different from the original. Controlled   Refractory migraine with aura 02/06/2021   Refractory migraine without aura 51/88/4166   Renal colic on left side 03/05/1600   Stage 3 chronic kidney disease ([place]) 02/20/2020   Unspecified adverse effect of other drug, medicinal and biological substance(995.29) 02/17/2006   Urinary tract obstruction 02/06/2021   Past Surgical History:  Procedure Laterality Date   ABDOMINAL HYSTERECTOMY     Fibroids   ATRIAL FIBRILLATION ABLATION     CARDIAC CATHETERIZATION     CARDIOVERSION     CESAREAN SECTION     KNEE SURGERY     LEFT HEART CATH AND CORONARY ANGIOGRAPHY N/A 03/04/2020   Procedure: LEFT HEART CATH AND CORONARY ANGIOGRAPHY;  Surgeon: Belva Crome, MD;  Location: [facility];  Service: Cardiovascular;  Laterality: N/A;   ROTATOR CUFF REPAIR       Current Outpatient Medications  Medication Sig Dispense Refill   acetaminophen (TYLENOL) 500 MG tablet Take 1,000 mg by mouth every 6 (six) hours as needed for mild pain.     albuterol (VENTOLIN HFA) 108 (90 Base) MCG/ACT inhaler Inhale 2 puffs into the lungs every 4 (four) hours as needed for wheezing. 1 each 2   apixaban (ELIQUIS) 5 MG TABS tablet Take 1 tablet (5 mg  total) by mouth 2 (two) times daily. 180 tablet 1   atenolol (TENORMIN) 50 MG tablet Take 0.5 tablets (25 mg total) by mouth 2 (two) times daily.     atorvastatin (LIPITOR) 80 MG tablet Take 1 tablet (80 mg total) by mouth daily. 90 tablet 3   B Complex Vitamins (B COMPLEX PO) Take 1 tablet by mouth daily.     blood glucose meter kit and supplies One Touch Ultra  Check 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 One Touch Ultra  Check blood sugars once daily Dx: E11.9) 1 each 0   buPROPion (WELLBUTRIN XL) 300 MG 24 hr tablet Take 1 tablet (300 mg total) by mouth daily. 90 tablet 2   diphenhydrAMINE (BENADRYL) 25 MG tablet Take 25 mg by mouth as needed.     EMGALITY 120 MG/ML SOAJ INJECT THE CONTENTS OF ONE PEN UNDER THE SKIN EVERY MONTH 1 mL 2   EPINEPHrine 0.3 mg/0.3 mL IJ SOAJ injection Inject 0.3 mg into the muscle as needed for anaphylaxis. 1 each 1   ezetimibe (ZETIA) 10 MG tablet TAKE 1 TABLET DAILY 90 tablet 3   fenofibrate 160 MG tablet Take 1 tablet (160 mg  total) by mouth daily. (Patient taking differently: Take 160 mg by mouth every evening.) 90 tablet 3   flecainide (TAMBOCOR) 100 MG tablet Take 1 tablet (100 mg total) by mouth 2 (two) times daily. 180 tablet 3   glucose blood (ONETOUCH VERIO) test strip Use daily to check blood sugar. (Patient taking differently: 1 each by Other route as needed for other (see instruction). Use daily to check blood sugar.) 100 each 3   levocetirizine (XYZAL) 5 MG tablet TAKE 1 TABLET EVERY EVENING 90 tablet 3   lisinopril (ZESTRIL) 5 MG tablet Take 1 tablet (5 mg total) by mouth daily. 90 tablet 3   Lurasidone HCl 120 MG TABS Take 1 tablet (120 mg total) by mouth at bedtime. 90 tablet 2   nitroGLYCERIN (NITROSTAT) 0.4 MG SL tablet Place 1 tablet (0.4 mg total) under the tongue every 5 (five) minutes as needed for chest pain. 25 tablet 11   ondansetron (ZOFRAN-ODT) 4 MG disintegrating tablet  Take 1 tablet (4 mg total) by mouth every 8 (eight) hours as needed for nausea. 20 tablet 2   pantoprazole (PROTONIX) 40 MG tablet Take 1 tablet (40 mg total) by mouth 2 (two) times daily before a meal. 180 tablet 1   QUEtiapine (SEROQUEL) 300 MG tablet Take 1 tablet (300 mg total) by mouth at bedtime. 90 tablet 2   topiramate (TOPAMAX) 50 MG tablet TAKE 3 TABLETS TWICE A DAY (Patient taking differently: Take 150 mg by mouth 2 (two) times daily.) 732 tablet 11   TRULICITY 1.5 KG/2.5KY SOPN Inject 1.5 mg into the skin as directed once a week. 6 mL 1   Ubrogepant 100 MG TABS Take 100 mg by mouth daily as needed for migraine.     VITAMIN D PO Take 1 tablet by mouth daily.     isosorbide mononitrate (IMDUR) 30 MG 24 hr tablet Take 0.5 tablets (15 mg total) by mouth daily. 45 tablet 3   No current facility-administered medications for this visit.    Allergies:   Bee venom, Onion, Tetanus-diphtheria toxoids td, Sumatriptan, Asa [aspirin], Nsaids, Iodine, Sulfa antibiotics, and Vancomycin   Social History:  The patient  reports that she has never smoked. She has never used smokeless tobacco. She reports that she does not drink alcohol and does not use drugs.   Family History:  The patient's family history includes Bipolar disorder in her daughter and son. She was adopted.   ROS:  Please see the history of present illness.   Otherwise, review of systems is positive for none.   All other systems are reviewed and negative.   PHYSICAL EXAM: VS:  BP 92/70   Pulse 60   Ht '5\' 6"'  (1.676 m)   Wt 221 lb 1.9 oz (100.3 kg)   SpO2 96%   BMI 35.69 kg/m  , BMI Body mass index is 35.69 kg/m. GEN: Well nourished, well developed, in no acute distress  HEENT: normal  Neck: no JVD, carotid bruits, or masses Cardiac: RRR; no murmurs, rubs, or gallops,no edema  Respiratory:  clear to auscultation bilaterally, normal work of breathing GI: soft, nontender, nondistended, + BS MS: no deformity or atrophy  Skin:  warm and dry Neuro:  Strength and sensation are intact Psych: euthymic mood, full affect  EKG:  EKG is ordered today. Personal review of the ekg ordered shows sinus rhythm, PVC, rate 60  Recent Labs: 12/15/2021: ALT 15; BUN 23; Creatinine, Ser 1.97; Magnesium 1.6; Potassium 4.1; Sodium 139 01/12/2022: Hemoglobin 12.0; Platelets  359; TSH 1.490    Lipid Panel     Component Value Date/Time   CHOL 123 12/15/2021 1023   CHOL 141 02/10/2021 0914   TRIG 169.0 (H) 12/15/2021 1023   HDL 23.30 (L) 12/15/2021 1023   HDL 26 (L) 02/10/2021 0914   CHOLHDL 5 12/15/2021 1023   VLDL 33.8 12/15/2021 1023   LDLCALC 66 12/15/2021 1023   LDLCALC 86 02/10/2021 0914     Wt Readings from Last 3 Encounters:  04/12/22 221 lb 1.9 oz (100.3 kg)  04/11/22 210 lb (95.3 kg)  03/29/22 222 lb 9.6 oz (101 kg)      Other studies Reviewed: Additional studies/ records that were reviewed today include: TTE 02/20/20  Review of the above records today demonstrates:   1. Left ventricular ejection fraction, by estimation, is 60 to 65%. The  left ventricle has normal function. The left ventricle has no regional  wall motion abnormalities. Left ventricular diastolic parameters are  consistent with Grade I diastolic  dysfunction (impaired relaxation). Elevated left ventricular end-diastolic  pressure.   2. Right ventricular systolic function is normal. The right ventricular  size is normal. There is normal pulmonary artery systolic pressure. The  estimated right ventricular systolic pressure is 95.0 mmHg.   3. The mitral valve is normal in structure. No evidence of mitral valve  regurgitation. No evidence of mitral stenosis.   4. The aortic valve is normal in structure. Aortic valve regurgitation is  not visualized. No aortic stenosis is present.   5. Aortic dilatation noted. 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   6. The inferior vena cava is normal in size with greater than 50%   respiratory variability, suggesting right atrial pressure of 3 mmHg.   LHC 03/04/20 Left dominant coronary anatomy Normal left main Normal LAD that wraps around the left ventricular apex and supplies one half of the posterior interventricular groove territory. Large first diagonal/ramus intermedius that covers the distribution typically supplied by diagonal branches. Nondominant right coronary Dominant circumflex coronary artery.  The circumflex coronary is tortuous including the first obtuse marginal branch. Normal left ventricular end-diastolic pressure.  Ventriculography not performed to limit contrast exposure.  Cardiac monitor 05/07/2020 personally reviewed Max 146 bpm 03:36am, 08/16 Min 52 bpm 05:33am, 08/08 Avg 68 bpm 1.7% PACs, <1% PVCs Predominant rhythm was sinus rhythm 3 short SVT episodes, longest 5 beats All triggered events/symptoms associated with sinus rhythm  Myoview 11/25/2020 Nuclear stress EF: 64%. The left ventricular ejection fraction is normal (55-65%). No T wave inversion was noted during stress. There was no ST segment deviation noted during stress. Defect 1: There is a small defect of mild severity present in the apex location. This is a low risk study.   ASSESSMENT AND PLAN:  1.  Paroxysmal atrial fibrillation: Currently on Eliquis 5 mg twice daily.  CHA2DS2-VASc of 2.  Status post ablation with PVI and CTI in [place] 09/27/2018.  She has not had any further obvious atrial fibrillation episodes.  Review of her Apple Watch recordings show intermittent episodes of tachycardia, with rates in the low 100s.  She is symptomatic from this.  Paisyn Guercio increase flecainide to 100 mg twice daily.  These were not obviously atrial fibrillation or atrial flutter.  Vahe Pienta have her return in 2 weeks for an ECG.  2.  Hyperlipidemia: Continue atorvastatin per primary cardiology.  3.  SVT: Had an emergency room visit 11/06/2021 with episodes of SVT which converted with adenosine.   Most  recent cardiac monitor showing short SVT episodes and no atrial fibrillation.  No further long episodes.  4.  Secondary hypercoagulable state: Currently on Eliquis for atrial fibrillation as above  Current medicines are reviewed at length with the patient today.   The patient does not have concerns regarding her medicines.  The following changes were made today: None  Labs/ tests ordered today include:  Orders Placed This Encounter  Procedures   EKG 12-Lead     Disposition:   FU with Nicholi Ghuman 3 months  Signed, Nyelli Samara [name], MD  04/12/2022 3:53 PM     [facility] [place] [address] [place] [address] [phone] (office) [phone] (fax)

## 2022-04-22 ENCOUNTER — Ambulatory Visit (INDEPENDENT_AMBULATORY_CARE_PROVIDER_SITE_OTHER): Payer: Commercial Managed Care - HMO

## 2022-04-22 ENCOUNTER — Encounter: Payer: Self-pay | Admitting: Cardiology

## 2022-04-22 ENCOUNTER — Ambulatory Visit: Payer: Commercial Managed Care - HMO | Admitting: Cardiology

## 2022-04-22 VITALS — BP 120/80 | HR 42 | Ht 66.0 in | Wt 218.0 lb

## 2022-04-22 DIAGNOSIS — I480 Paroxysmal atrial fibrillation: Secondary | ICD-10-CM

## 2022-04-22 DIAGNOSIS — R079 Chest pain, unspecified: Secondary | ICD-10-CM | POA: Diagnosis not present

## 2022-04-22 DIAGNOSIS — I471 Supraventricular tachycardia: Secondary | ICD-10-CM | POA: Diagnosis not present

## 2022-04-22 DIAGNOSIS — E785 Hyperlipidemia, unspecified: Secondary | ICD-10-CM

## 2022-04-22 NOTE — Addendum Note (Signed)
Addended by: Darius Bump B on: 04/22/2022 11:44 AM   Modules accepted: Orders

## 2022-04-22 NOTE — Addendum Note (Signed)
Addended by: Jacobo Forest D on: 04/22/2022 10:56 AM   Modules accepted: Orders

## 2022-04-22 NOTE — Addendum Note (Signed)
Addended by: Darius Bump B on: 04/22/2022 04:04 PM   Modules accepted: Orders

## 2022-04-22 NOTE — Patient Instructions (Addendum)
Medication Instructions:  Your physician recommends that you continue on your current medications as directed. Please refer to the Current Medication list given to you today.  *If you need a refill on your cardiac medications before your next appointment, please call your pharmacy*   Lab Work: None Ordered If you have labs (blood work) drawn today and your tests are completely normal, you will receive your results only by: MyChart Message (if you have MyChart) OR A paper copy in the mail If you have any lab test that is abnormal or we need to change your treatment, we will call you to review the results.   Testing/Procedures:  WHY IS MY DOCTOR PRESCRIBING ZIO? The Zio system is proven and trusted by physicians to detect and diagnose irregular heart rhythms -- and has been prescribed to hundreds of thousands of patients.  The FDA has cleared the Zio system to monitor for many different kinds of irregular heart rhythms. In a study, physicians were able to reach a diagnosis 90% of the time with the Zio system1.  You can wear the Zio monitor -- a small, discreet, comfortable patch -- during your normal day-to-day activity, including while you sleep, shower, and exercise, while it records every single heartbeat for analysis.  1Barrett, P., et al. Comparison of 24 Hour Holter Monitoring Versus 14 Day Novel Adhesive Patch Electrocardiographic Monitoring. American Journal of Medicine, 2014.  ZIO VS. HOLTER MONITORING The Zio monitor can be comfortably worn for up to 14 days. Holter monitors can be worn for 24 to 48 hours, limiting the time to record any irregular heart rhythms you may have. Zio is able to capture data for the 51% of patients who have their first symptom-triggered arrhythmia after 48 hours.1  LIVE WITHOUT RESTRICTIONS The Zio ambulatory cardiac monitor is a small, unobtrusive, and water-resistant patch--you might even forget you're wearing it. The Zio monitor records and stores  every beat of your heart, whether you're sleeping, working out, or showering.     Follow-Up: At [facility], you and your health needs are our priority.  As part of our continuing mission to provide you with exceptional heart care, we have created designated Provider Care Teams.  These Care Teams include your primary Cardiologist (physician) and Advanced Practice Providers (APPs -  Physician Assistants and Nurse Practitioners) who all work together to provide you with the care you need, when you need it.  We recommend signing up for the patient portal called "MyChart".  Sign up information is provided on this After Visit Summary.  MyChart is used to connect with patients for Virtual Visits (Telemedicine).  Patients are able to view lab/test results, encounter notes, upcoming appointments, etc.  Non-urgent messages can be sent to your provider as well.   To learn more about what you can do with MyChart, go to [email or web address].    Your next appointment:   4 month(s)  The format for your next appointment:   In Person  Provider:   Robert Krasowski, MD    Other Instructions NA  

## 2022-04-22 NOTE — Progress Notes (Signed)
Cardiology Office Note:    Date:  04/22/2022   ID:  Paula Stout, DOB 1956/05/10, MRN [id number]  PCP:  [name], DO  Cardiologist:  Jenne Campus, MD    Referring MD: [name]*   Chief Complaint  Patient presents with   Follow-up  Still not feeling well  History of Present Illness:    Paula Stout is a 66 y.o. female with past medical history significant for paroxysmal atrial fibrillation with block successfully suppressed with flecainide, she is anticoagulant Eliquis which I will continue, also supraventricular tachycardia, a look like AV nodal reentry, dyslipidemia, type 2 diabetes, bipolar disorder, bronchial asthma.  She comes today to my office for follow-up.  She did see my colleague Dr. Curt Bears few weeks ago and had the dose of her flecainide has been increased she cannot tell me if there is any difference.  Still complaining of having palpitations.  She is worried about her heart rate speeding up at evening, she showed me some recording from her cardia device with sinus tachycardia heart rate of 111 at evening time.  We were talking about potentially doing ablation but I am not sure exactly what I would feel about ablating I am not sure if her symptomatology is necessary related to recommended to be seen on the monitor.  She tells me today when she wore monitor she did not have any palpitations and literally half an hour after monitor has been taken off she had another episodes.  Therefore I think the best way to assess the situation appropriately will be to do another monitor.  Past Medical History:  Diagnosis Date   A-fib [facility])    ACS (acute coronary syndrome) ([place]) 07/31/2021   Acute cystitis without hematuria 05/14/2017   Last Assessment & Plan:  Formatting of this note might be different from the original. - suprapubic tenderness with symptoms of urinary frequency and urgency and subjective fevers (no fevers recorded on any visit). Recently  passed a renal stone, US shows left hydronephrosis which is improving.  - UA negative8/24 but pt is clinically symptomatic - will repeat UA  - start pt on Microbid for 5 days   Adhesive capsulitis of left shoulder 12/16/2016   Anxiety    Asthma without status asthmaticus 02/06/2021   At risk for falls 02/04/2019   Atrial fibrillation ([place]) 09/01/2014   Last Assessment & Plan:  Formatting of this note might be different from the original. A:  Chronic.  Sinus rhythm at this time.  States she was told this during admission at [facility] in the past. P:  On baby aspirin at home will continue.  Low dose metoprolol started.   Bipolar 1 disorder, depressed, moderate ([place]) 02/18/2020   Capsulitis of left shoulder 10/20/2021   Chronic anticoagulation 02/20/2020   Chronic atrial fibrillation ([place]) 02/18/2020   Chronic headache 10/11/2013   Chronic interstitial cystitis 12/28/2005   Chronic migraine without aura, intractable, without status migrainosus 06/24/2017   Colon cancer screening 04/15/2016   Last Assessment & Plan:  Formatting of this note might be different from the original. Will schedule for colonoscopy   Depression, recurrent ([place]) 02/18/2020   Diabetes mellitus ([place])    Dyslipidemia 09/38/1829   Dysmetabolic syndrome X 93/71/6967   GAD (generalized anxiety disorder) 02/18/2020   GERD (gastroesophageal reflux disease) 11/05/2021   Heartburn 04/15/2016   Last Assessment & Plan:  Formatting of this note might be different from the original. Patient was counseled regarding lifestyle modification  Advised to  take Prilosec 30 mins before breakfast  Will schedule for EGD   History of atrial fibrillation 06/24/2017   History of posttraumatic stress disorder (PTSD) 10/07/2016   HLD (hyperlipidemia) 09/01/2014   Last Assessment & Plan:  Formatting of this note might be different from the original. A: Chronic. P: Continue statin.   Hyperosmolarity due to secondary diabetes mellitus ([place]) 02/06/2021    Hypertriglyceridemia 02/18/2020   Impaired mobility and activities of daily living 11/14/2015   Irritable bowel syndrome (IBS)    Junctional tachycardia ([place]) 11/05/2021   Kidney stone 02/06/2021   Migraine without aura, not refractory 02/06/2021   Mixed hyperlipidemia 04/06/2021   Last Assessment & Plan:  Formatting of this note might be different from the original. Due for labs with PCP in November   Morbid obesity ([place]) 12/16/2016   Paroxysmal supraventricular tachycardia ([place]) 11/05/2021   Pneumonia, organism unspecified(486) 11/21/2002   Polyneuropathy due to type 2 diabetes mellitus ([place]) 02/06/2021   Primary hypertension 04/06/2021   Last Assessment & Plan:  Formatting of this note might be different from the original. Controlled   Refractory migraine with aura 02/06/2021   Refractory migraine without aura 73/53/2992   Renal colic on left side 42/68/3419   Stage 3 chronic kidney disease ([place]) 02/20/2020   Unspecified adverse effect of other drug, medicinal and biological substance(995.29) 02/17/2006   Urinary tract obstruction 02/06/2021    Past Surgical History:  Procedure Laterality Date   ABDOMINAL HYSTERECTOMY     Fibroids   ATRIAL FIBRILLATION ABLATION     CARDIAC CATHETERIZATION     CARDIOVERSION     CESAREAN SECTION     KNEE SURGERY     LEFT HEART CATH AND CORONARY ANGIOGRAPHY N/A 03/04/2020   Procedure: LEFT HEART CATH AND CORONARY ANGIOGRAPHY;  Surgeon: Belva Crome, MD;  Location: [facility];  Service: Cardiovascular;  Laterality: N/A;   ROTATOR CUFF REPAIR      Current Medications: Current Meds  Medication Sig   acetaminophen (TYLENOL) 500 MG tablet Take 1,000 mg by mouth every 6 (six) hours as needed for mild pain.   albuterol (VENTOLIN HFA) 108 (90 Base) MCG/ACT inhaler Inhale 2 puffs into the lungs every 4 (four) hours as needed for wheezing.   apixaban (ELIQUIS) 5 MG TABS tablet Take 1 tablet (5 mg total) by mouth 2 (two) times daily.    atenolol (TENORMIN) 50 MG tablet Take 0.5 tablets (25 mg total) by mouth 2 (two) times daily.   atorvastatin (LIPITOR) 80 MG tablet Take 1 tablet (80 mg total) by mouth daily.   B Complex Vitamins (B COMPLEX PO) Take 1 tablet by mouth daily.   blood glucose meter kit and supplies One Touch Ultra  Check 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 One Touch Ultra  Check blood sugars once daily Dx: E11.9)   buPROPion (WELLBUTRIN XL) 300 MG 24 hr tablet Take 1 tablet (300 mg total) by mouth daily.   diphenhydrAMINE (BENADRYL) 25 MG tablet Take 25 mg by mouth as needed.   EMGALITY 120 MG/ML SOAJ INJECT THE CONTENTS OF ONE PEN UNDER THE SKIN EVERY MONTH   EPINEPHrine 0.3 mg/0.3 mL IJ SOAJ injection Inject 0.3 mg into the muscle as needed for anaphylaxis.   ezetimibe (ZETIA) 10 MG tablet TAKE 1 TABLET DAILY   fenofibrate 160 MG tablet Take 1 tablet (160 mg total) by mouth daily.   flecainide (TAMBOCOR) 100 MG tablet Take 1  tablet (100 mg total) by mouth 2 (two) times daily.   glucose blood (ONETOUCH VERIO) test strip Use daily to check blood sugar. (Patient taking differently: 1 each by Other route as needed for other (see instruction). Use daily to check blood sugar.)   isosorbide mononitrate (IMDUR) 30 MG 24 hr tablet Take 0.5 tablets (15 mg total) by mouth daily.   levocetirizine (XYZAL) 5 MG tablet TAKE 1 TABLET EVERY EVENING   lisinopril (ZESTRIL) 5 MG tablet Take 1 tablet (5 mg total) by mouth daily.   Lurasidone HCl 120 MG TABS Take 1 tablet (120 mg total) by mouth at bedtime.   nitroGLYCERIN (NITROSTAT) 0.4 MG SL tablet Place 1 tablet (0.4 mg total) under the tongue every 5 (five) minutes as needed for chest pain.   ondansetron (ZOFRAN-ODT) 4 MG disintegrating tablet Take 1 tablet (4 mg total) by mouth every 8 (eight) hours as needed for nausea.   pantoprazole (PROTONIX) 40 MG tablet Take 1 tablet (40 mg total) by mouth 2 (two) times  daily before a meal.   QUEtiapine (SEROQUEL) 300 MG tablet Take 1 tablet (300 mg total) by mouth at bedtime.   topiramate (TOPAMAX) 50 MG tablet TAKE 3 TABLETS TWICE A DAY   TRULICITY 1.5 GE/9.5MW SOPN Inject 1.5 mg into the skin as directed once a week.   Ubrogepant 100 MG TABS Take 100 mg by mouth daily as needed for migraine.   VITAMIN D PO Take 1 tablet by mouth daily.     Allergies:   Bee venom, Onion, Tetanus-diphtheria toxoids td, Sumatriptan, Asa [aspirin], Nsaids, Iodine, Sulfa antibiotics, and Vancomycin   Social History   Socioeconomic History   Marital status: Widowed    Spouse name: Not on file   Number of children: 6   Years of education: Not on file   Highest education level: Not on file  Occupational History   Not on file  Tobacco Use   Smoking status: Never   Smokeless tobacco: Never  Vaping Use   Vaping Use: Never used  Substance and Sexual Activity   Alcohol use: Never   Drug use: Never   Sexual activity: Not on file  Other Topics Concern   Not on file  Social History Narrative   Not on file   Social Determinants of Health   Financial Resource Strain: Not on file  Food Insecurity: Not on file  Transportation Needs: Not on file  Physical Activity: Not on file  Stress: Not on file  Social Connections: Not on file     Family History: The patient's family history includes Bipolar disorder in her daughter and son. She was adopted. ROS:   Please see the history of present illness.    All 14 point review of systems negative except as described per history of present illness  EKGs/Labs/Other Studies Reviewed:      Recent Labs: 12/15/2021: ALT 15; BUN 23; Creatinine, Ser 1.97; Magnesium 1.6; Potassium 4.1; Sodium 139 01/12/2022: Hemoglobin 12.0; Platelets 359; TSH 1.490  Recent Lipid Panel    Component Value Date/Time   CHOL 123 12/15/2021 1023   CHOL 141 02/10/2021 0914   TRIG 169.0 (H) 12/15/2021 1023   HDL 23.30 (L) 12/15/2021 1023   HDL 26  (L) 02/10/2021 0914   CHOLHDL 5 12/15/2021 1023   VLDL 33.8 12/15/2021 1023   LDLCALC 66 12/15/2021 1023   LDLCALC 86 02/10/2021 0914    Physical Exam:    VS:  BP 120/80 (BP Location: Right Arm, Patient Position:  Sitting, Cuff Size: Normal)   Pulse (!) 42   Ht 5' 6" (1.676 m)   Wt 218 lb (98.9 kg)   SpO2 98%   BMI 35.19 kg/m     Wt Readings from Last 3 Encounters:  04/22/22 218 lb (98.9 kg)  04/12/22 221 lb 1.9 oz (100.3 kg)  04/11/22 210 lb (95.3 kg)     GEN:  Well nourished, well developed in no acute distress HEENT: Normal NECK: No JVD; No carotid bruits LYMPHATICS: No lymphadenopathy CARDIAC: RRR, no murmurs, no rubs, no gallops RESPIRATORY:  Clear to auscultation without rales, wheezing or rhonchi  ABDOMEN: Soft, non-tender, non-distended MUSCULOSKELETAL:  No edema; No deformity  SKIN: Warm and dry LOWER EXTREMITIES: no swelling NEUROLOGIC:  Alert and oriented x 3 PSYCHIATRIC:  Normal affect   ASSESSMENT:    1. Paroxysmal atrial fibrillation (HCC)   2. SVT (supraventricular tachycardia) (HCC)   3. Chest pain, unspecified type   4. Dyslipidemia    PLAN:    In order of problems listed above:  Paroxysmal atrial fibrillation left axis suppressed with flecainide and Eliquis which I will continue. History of AV nodal reentry SVT.  However none recently.  I will ask her to wear Zio patch for another 2 weeks to see if she had any symptomatic arrhythmia and the reason for doing this is the fact she is bradycardic today her heart rate was 49.  How she will ever she is asymptomatic denies have any chest pain tightness squeezing pressure burning chest no dizziness no passing out Dyslipidemia, I did review K PN which only dated from December 15, 2021 with LDL of 66 HDL 23.  She is on high intense statin which I will continue.   Medication Adjustments/Labs and Tests Ordered: Current medicines are reviewed at length with the patient today.  Concerns regarding medicines are  outlined above.  No orders of the defined types were placed in this encounter.  Medication changes: No orders of the defined types were placed in this encounter.   Signed, Park Liter, MD, [facility] 04/22/2022 10:42 AM    [place]

## 2022-04-26 ENCOUNTER — Encounter: Payer: Self-pay | Admitting: Family Medicine

## 2022-04-26 ENCOUNTER — Ambulatory Visit (INDEPENDENT_AMBULATORY_CARE_PROVIDER_SITE_OTHER): Payer: Commercial Managed Care - HMO | Admitting: Family Medicine

## 2022-04-26 VITALS — BP 120/78 | HR 42 | Temp 97.7°F | Ht 66.0 in | Wt 221.5 lb

## 2022-04-26 DIAGNOSIS — J0100 Acute maxillary sinusitis, unspecified: Secondary | ICD-10-CM | POA: Diagnosis not present

## 2022-04-26 MED ORDER — DOXYCYCLINE HYCLATE 100 MG PO TABS: 100.0000 mg | ORAL_TABLET | Freq: Two times a day (BID) | ORAL | 0 refills | Status: AC

## 2022-04-26 MED ORDER — FLUCONAZOLE 150 MG PO TABS: ORAL_TABLET | ORAL | 0 refills | Status: DC

## 2022-04-26 MED ORDER — PREDNISONE 20 MG PO TABS: 40.0000 mg | ORAL_TABLET | Freq: Every day | ORAL | 0 refills | Status: AC

## 2022-04-26 NOTE — Patient Instructions (Signed)
Continue to push fluids, practice good hand hygiene, and cover your mouth if you cough.  If you start having fevers, shaking or shortness of breath, seek immediate care.  Take the predisone first. If no improvement in 2-3 days, start the doxycycline.   OK to take Tylenol 1000 mg (2 extra strength tabs) or 975 mg (3 regular strength tabs) every 6 hours as needed.  Let us know if you need anything.

## 2022-04-26 NOTE — Progress Notes (Signed)
Chief Complaint  Patient presents with   Sinusitis    Sinus pressure    Paula Stout here for URI complaints.  Duration: 4 days  Associated symptoms: sinus congestion, sinus pain, ear pain, and dental pain Denies: rhinorrhea, itchy watery eyes, ear drainage, sore throat, wheezing, shortness of breath, myalgia, and fevers Treatment to date: Sudafed, Tyelnol Sick contacts: No Getting worse over past 3 d.   Past Medical History:  Diagnosis Date   A-fib [facility])    ACS (acute coronary syndrome) ([place]) 07/31/2021   Acute cystitis without hematuria 05/14/2017   Last Assessment & Plan:  Formatting of this note might be different from the original. - suprapubic tenderness with symptoms of urinary frequency and urgency and subjective fevers (no fevers recorded on any visit). Recently passed a renal stone, US shows left hydronephrosis which is improving.  - UA negative8/24 but pt is clinically symptomatic - will repeat UA  - start pt on Microbid for 5 days   Adhesive capsulitis of left shoulder 12/16/2016   Anxiety    Asthma without status asthmaticus 02/06/2021   At risk for falls 02/04/2019   Atrial fibrillation ([place]) 09/01/2014   Last Assessment & Plan:  Formatting of this note might be different from the original. A:  Chronic.  Sinus rhythm at this time.  States she was told this during admission at [facility] in the past. P:  On baby aspirin at home will continue.  Low dose metoprolol started.   Bipolar 1 disorder, depressed, moderate ([place]) 02/18/2020   Capsulitis of left shoulder 10/20/2021   Chronic anticoagulation 02/20/2020   Chronic atrial fibrillation ([place]) 02/18/2020   Chronic headache 10/11/2013   Chronic interstitial cystitis 12/28/2005   Chronic migraine without aura, intractable, without status migrainosus 06/24/2017   Colon cancer screening 04/15/2016   Last Assessment & Plan:  Formatting of this note might be different from the original. Will schedule for colonoscopy    Depression, recurrent ([place]) 02/18/2020   Diabetes mellitus ([place])    Dyslipidemia 42/35/3614   Dysmetabolic syndrome X 43/15/4008   GAD (generalized anxiety disorder) 02/18/2020   GERD (gastroesophageal reflux disease) 11/05/2021   Heartburn 04/15/2016   Last Assessment & Plan:  Formatting of this note might be different from the original. Patient was counseled regarding lifestyle modification  Advised to take Prilosec 30 mins before breakfast  Will schedule for EGD   History of atrial fibrillation 06/24/2017   History of posttraumatic stress disorder (PTSD) 10/07/2016   HLD (hyperlipidemia) 09/01/2014   Last Assessment & Plan:  Formatting of this note might be different from the original. A: Chronic. P: Continue statin.   Hyperosmolarity due to secondary diabetes mellitus ([place]) 02/06/2021   Hypertriglyceridemia 02/18/2020   Impaired mobility and activities of daily living 11/14/2015   Irritable bowel syndrome (IBS)    Junctional tachycardia ([place]) 11/05/2021   Kidney stone 02/06/2021   Migraine without aura, not refractory 02/06/2021   Mixed hyperlipidemia 04/06/2021   Last Assessment & Plan:  Formatting of this note might be different from the original. 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 Controlled   Refractory migraine with aura 02/06/2021   Refractory migraine without aura 02/06/2021   Renal  colic on left side 40/98/1191   Stage 3 chronic kidney disease ([place]) 02/20/2020   Unspecified adverse effect of other drug, medicinal and biological substance(995.29) 02/17/2006   Urinary tract obstruction 02/06/2021    Objective BP 120/78   Pulse (!) 42   Temp  97.7 F (36.5 C) (Oral)   Ht 5\' 6"  (1.676 m)   Wt 221 lb 8 oz (100.5 kg)   SpO2 99%   BMI 35.75 kg/m  General: Awake, alert, appears stated age HEENT: AT, [place], ears patent b/l and TM's neg, nares patent w/o discharge, pharynx pink and without exudates, MMM Neck: No masses or asymmetry Heart: RRR Lungs: CTAB, no accessory muscle use Psych: Age appropriate judgment and insight, normal mood and affect  Acute non-recurrent maxillary sinusitis - Plan: predniSONE (DELTASONE) 20 MG tablet, doxycycline (VIBRA-TABS) 100 MG tablet, fluconazole (DIFLUCAN) 150 MG tablet  5 d pred burst 40 mg/d. If no improvement in next 2-3 d, she has gotten worse over 3 d so will send in 7 d of doxy. Diflucan if she develops a yeast infection.  Continue to push fluids, practice good hand hygiene, cover mouth when coughing. F/u prn. If starting to experience fevers, shaking, or shortness of breath, seek immediate care. Pt voiced understanding and agreement to the plan.  [place], DO 04/26/22 11:41 AM

## 2022-05-04 ENCOUNTER — Ambulatory Visit: Payer: Commercial Managed Care - HMO

## 2022-05-12 ENCOUNTER — Telehealth: Payer: Self-pay | Admitting: Cardiology

## 2022-05-12 NOTE — Telephone Encounter (Signed)
Forward to [employer].

## 2022-05-12 NOTE — Telephone Encounter (Signed)
Caller would like to reschedule her Nurse's visit for an EKG.

## 2022-05-12 NOTE — Progress Notes (Signed)
NEUROLOGY CONSULTATION NOTE  Paula Stout MRN: [id number] DOB: Aug 12, 1956  Referring provider: Riki Sheer, DO Primary care provider: Riki Sheer, DO  Reason for consult:  headache  Assessment/Plan:   Chronic migraine Migraine with aura Patient has at least 15 headache days a month for over 3 consecutive days.  Failed Emgality, topiramate, propranolol, amitriptyline, Depakote.  Meets criteria for Botox.  Previously only had one round as treating provider left practice.    Migraine prevention:  Start Botox Migraine rescue:  She will try [place].  Stop Ubrelvy and Excedrin Limit use of pain relievers to no more than 2 days out of week to prevent risk of rebound or  medication-overuse headache. Caffeine cessation/increase water intake/exercise Keep headache diary Follow up 4 months.    Subjective:  Paula Stout is a 66 year old left-handed female with paroxysmal atrial fibrillation, PSVT s/p ablation (now with bradycardia and hypotension due to medications), DM II, depression, anxiety who presents for headaches.  History supplemented by referring provider's note.  Onset:  about 1978 Location:  primarily unilateral (either side) posterior and into neck Quality:  stabbing Intensity:  Severe Aura:  phantosmia (usually smells burnt toast or something else cooking) Prodrome:  absent Postdrome:  Associated symptoms:  nausea, vomiting, photophobia, phonophobia, osmophobia, blurred.  She denies associated unilateral numbness or weakness. Duration:  several hours to 2 days Frequency:  1 to 2 a week. (Has 15 headache days a month) Frequency of abortive medication: Takes Excedrin about 4-5 days a week Triggers:  weather changes, certain smells (perfumes) Relieving factors:  ice pack Activity:  aggravates (cannot function about 2-3 days a month) Normally wakes up with them  Prior imaging (personally reviewed): 10/15/2021 CT HEAD WO:  No acute intracranial  abnormality. 12/02/2020 MRI BRAIN W WO:  This is a normal age-appropriate MRI of the brain with and without contrast. 04/22/2020 MRI BRAIN WO:  Normal MRI brain (without). No acute findings.  Past NSAIDS/analgesics:  Fioricet, tramadol, ASA (allergy) Past abortive triptans:  rizatriptan, sumatriptan (adverse reaction), frovatriptan Past abortive ergotamine:  none Past muscle relaxants:  tizanidine Past anti-emetic:  none Past antihypertensive medications:  propranolol, metoprolol, diltiazem Past antidepressant medications:  amitriptyline Past anticonvulsant medications:  Depakote Past anti-CGRP:  none Past vitamins/Herbal/Supplements:  none Past antihistamines/decongestants:  none Other past therapies:  Botox (only had one round because neurologist left)  Rescue protocol:  Ubrelvy, Excedrin Migraine (both ineffective) Current NSAIDS/analgesics:  Excedrin Migraine Current triptans:  none Current ergotamine:  none Current anti-emetic:  Zofran ODT 21m Current muscle relaxants:  none Current Antihypertensive medications:  atenolol, lisinopril, Imdur Current Antidepressant medications:  bupropion XL 3057mCurrent Anticonvulsant medications:  topiramate 15067maily Current anti-CGRP:  Emgality, Ubrelvy 100m37mrrent Vitamins/Herbal/Supplements:  D Current Antihistamines/Decongestants:  Benadryl, Xyzal Other therapy:  none Hormone/birth control:  none Other medications:  lurasidone, quetiapine 300mg44m, Eliquis   Caffeine:  1 Venti size cup of coffee daily.  No soda Diet:  four 8 oz glasses of water daily.  No soda.  Skips meals (lunch) Exercise:  no Depression:  stable; Anxiety:  stable Other pain:  no Sleep hygiene:  Sleeps well.  Takes Seroquel.  Daytime somnolence.  Tested negative for sleep apnea. Family history of headache:  daughter (migraines)      PAST MEDICAL HISTORY: Past Medical History:  Diagnosis Date   [facility] [place]   Acute cystitis without hematuria 05/14/2017   Last Assessment & Plan:  Formatting of this  note might be different from the original. - suprapubic tenderness with symptoms of urinary frequency and urgency and subjective fevers (no fevers recorded on any visit). Recently passed a renal stone, US shows left hydronephrosis which is improving.  - UA negative8/24 but pt is clinically symptomatic - will repeat UA  - start pt on Microbid for 5 days   Adhesive capsulitis of left shoulder 12/16/2016   Anxiety    Asthma without status asthmaticus 02/06/2021   At risk for falls 02/04/2019   Atrial fibrillation ([place]) 09/01/2014   Last Assessment & Plan:  Formatting of this note might be different from the original. A:  Chronic.  Sinus rhythm at this time.  States she was told this during admission at [facility] in the past. P:  On baby aspirin at home will continue.  Low dose metoprolol started.   Bipolar 1 disorder, depressed, moderate ([place]) 02/18/2020   Capsulitis of left shoulder 10/20/2021   Chronic anticoagulation 02/20/2020   Chronic atrial fibrillation ([place]) 02/18/2020   Chronic headache 10/11/2013   Chronic interstitial cystitis 12/28/2005   Chronic migraine without aura, intractable, without status migrainosus 06/24/2017   Colon cancer screening 04/15/2016   Last Assessment & Plan:  Formatting of this note might be different from the original. Will schedule for colonoscopy   Depression, recurrent ([place]) 02/18/2020   Diabetes mellitus ([place])    Dyslipidemia 44/81/8563   Dysmetabolic syndrome X 14/97/0263   GAD (generalized anxiety disorder) 02/18/2020   GERD (gastroesophageal reflux disease) 11/05/2021   Heartburn 04/15/2016   Last Assessment & Plan:  Formatting of this note might be different from the original. Patient was counseled regarding lifestyle modification  Advised to take Prilosec 30 mins before breakfast  Will schedule for EGD   History of atrial fibrillation 06/24/2017    History of posttraumatic stress disorder (PTSD) 10/07/2016   HLD (hyperlipidemia) 09/01/2014   Last Assessment & Plan:  Formatting of this note might be different from the original. A: Chronic. P: Continue statin.   Hyperosmolarity due to secondary diabetes mellitus ([place]) 02/06/2021   Hypertriglyceridemia 02/18/2020   Impaired mobility and activities of daily living 11/14/2015   Irritable bowel syndrome (IBS)    Junctional tachycardia ([place]) 11/05/2021   Kidney stone 02/06/2021   Migraine without aura, not refractory 02/06/2021   Mixed hyperlipidemia 04/06/2021   Last Assessment & Plan:  Formatting of this note might be different from the original. Due for labs with PCP in November   Morbid obesity ([place]) 12/16/2016   Paroxysmal supraventricular tachycardia ([place]) 11/05/2021   Pneumonia, organism unspecified(486) 11/21/2002   Polyneuropathy due to type 2 diabetes mellitus ([place]) 02/06/2021   Primary hypertension 04/06/2021   Last Assessment & Plan:  Formatting of this note might be different from the original. Controlled   Refractory migraine with aura 02/06/2021   Refractory migraine without aura 78/58/8502   Renal colic on left side 77/41/2878   Stage 3 chronic kidney disease ([place]) 02/20/2020   Unspecified adverse effect of other drug, medicinal and biological substance(995.29) 02/17/2006   Urinary tract obstruction 02/06/2021    PAST SURGICAL HISTORY: Past Surgical History:  Procedure Laterality Date   ABDOMINAL HYSTERECTOMY     Fibroids   ATRIAL FIBRILLATION ABLATION     CARDIAC CATHETERIZATION     CARDIOVERSION     CESAREAN SECTION     KNEE SURGERY     LEFT HEART CATH AND CORONARY ANGIOGRAPHY N/A 03/04/2020   Procedure: LEFT HEART CATH AND CORONARY ANGIOGRAPHY;  Surgeon: Tamala Julian,  Lynnell Dike, MD;  Location: [facility];  Service: Cardiovascular;  Laterality: N/A;   ROTATOR CUFF REPAIR      MEDICATIONS: Current Outpatient Medications on File Prior to Visit  Medication Sig  Dispense Refill   acetaminophen (TYLENOL) 500 MG tablet Take 1,000 mg by mouth every 6 (six) hours as needed for mild pain.     albuterol (VENTOLIN HFA) 108 (90 Base) MCG/ACT inhaler Inhale 2 puffs into the lungs every 4 (four) hours as needed for wheezing. 1 each 2   apixaban (ELIQUIS) 5 MG TABS tablet Take 1 tablet (5 mg total) by mouth 2 (two) times daily. 180 tablet 1   atenolol (TENORMIN) 50 MG tablet Take 0.5 tablets (25 mg total) by mouth 2 (two) times daily.     atorvastatin (LIPITOR) 80 MG tablet Take 1 tablet (80 mg total) by mouth daily. 90 tablet 3   B Complex Vitamins (B COMPLEX PO) Take 1 tablet by mouth daily.     blood glucose meter kit and supplies One Touch Ultra  Check 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 One Touch Ultra  Check blood sugars once daily Dx: E11.9) 1 each 0   buPROPion (WELLBUTRIN XL) 300 MG 24 hr tablet Take 1 tablet (300 mg total) by mouth daily. 90 tablet 2   diphenhydrAMINE (BENADRYL) 25 MG tablet Take 25 mg by mouth as needed.     EMGALITY 120 MG/ML SOAJ INJECT THE CONTENTS OF ONE PEN UNDER THE SKIN EVERY MONTH 1 mL 2   EPINEPHrine 0.3 mg/0.3 mL IJ SOAJ injection Inject 0.3 mg into the muscle as needed for anaphylaxis. 1 each 1   ezetimibe (ZETIA) 10 MG tablet TAKE 1 TABLET DAILY 90 tablet 3   fenofibrate 160 MG tablet Take 1 tablet (160 mg total) by mouth daily. 90 tablet 3   flecainide (TAMBOCOR) 100 MG tablet Take 1 tablet (100 mg total) by mouth 2 (two) times daily. 180 tablet 3   glucose blood (ONETOUCH VERIO) test strip Use daily to check blood sugar. (Patient taking differently: 1 each by Other route as needed for other (see instruction). Use daily to check blood sugar.) 100 each 3   levocetirizine (XYZAL) 5 MG tablet TAKE 1 TABLET EVERY EVENING 90 tablet 3   lisinopril (ZESTRIL) 5 MG tablet Take 1 tablet (5 mg total) by mouth daily. 90 tablet 3   Lurasidone HCl 120 MG TABS Take 1  tablet (120 mg total) by mouth at bedtime. 90 tablet 2   nitroGLYCERIN (NITROSTAT) 0.4 MG SL tablet Place 1 tablet (0.4 mg total) under the tongue every 5 (five) minutes as needed for chest pain. 25 tablet 11   pantoprazole (PROTONIX) 40 MG tablet Take 1 tablet (40 mg total) by mouth 2 (two) times daily before a meal. 180 tablet 1   QUEtiapine (SEROQUEL) 300 MG tablet Take 1 tablet (300 mg total) by mouth at bedtime. 90 tablet 2   topiramate (TOPAMAX) 50 MG tablet TAKE 3 TABLETS TWICE A DAY 435 tablet 11   TRULICITY 1.5 WY/6.1UO SOPN Inject 1.5 mg into the skin as directed once a week. 6 mL 1   VITAMIN D PO Take 1 tablet by mouth daily.     isosorbide mononitrate (IMDUR) 30 MG 24 hr tablet Take 0.5 tablets (15 mg total) by mouth daily. 45 tablet 3   No current facility-administered medications on file prior to visit.     ALLERGIES:  Allergies  Allergen Reactions   Bee Venom Anaphylaxis   Onion Other (See Comments)    Respiratory Distress   Tetanus-Diphtheria Toxoids Td Anaphylaxis   Sumatriptan     Passed out, nose bleed    Asa [Aspirin] Nausea And Vomiting   Nsaids Nausea Only    Rash, hives and trouble breathing   Iodine Rash   Sulfa Antibiotics Rash   Vancomycin Rash    FAMILY HISTORY: Family History  Adopted: Yes  Problem Relation Age of Onset   Bipolar disorder Son    Bipolar disorder Daughter     Objective:  Blood pressure 109/70, pulse 84, height _0  (1.702 m), weight 202 lb (91.6 kg), SpO2 95 %. General: No acute distress.  Patient appears well-groomed.   Head:  Normocephalic/atraumatic Eyes:  fundi examined but not visualized Neck: supple, no paraspinal tenderness, full range of motion Back: No paraspinal tenderness Heart: regular rate and rhythm Lungs: Clear to auscultation bilaterally. Vascular: No carotid bruits. Neurological Exam: Mental status: alert and oriented to person, place, and time, speech fluent and not dysarthric, language intact. Cranial  nerves: CN I: not tested CN II: pupils equal, round and reactive to light, visual fields intact CN III, IV, VI:  full range of motion, no nystagmus, no ptosis CN V: facial sensation intact. CN VII: upper and lower face symmetric CN VIII: hearing intact CN IX, X: gag intact, uvula midline CN XI: sternocleidomastoid and trapezius muscles intact CN XII: tongue midline Bulk & Tone: normal, no fasciculations. Motor:  muscle strength 5/5 throughout Sensation:  Pinprick, temperature and vibratory sensation intact. Deep Tendon Reflexes:  2+ throughout,  toes downgoing.   Finger to nose testing:  Without dysmetria.   Heel to shin:  Without dysmetria.   Gait:  Normal station and stride.  Romberg negative.    Thank you for allowing me to take part in the care of this patient.  Metta Clines, DO  CC: Riki Sheer, DO

## 2022-05-13 NOTE — Telephone Encounter (Addendum)
Pt rescheduled for nurse visit Monday.  She reports feeling like she is going to pass out when she stands up too fast. Just walking from the living room to the kitchen she gets SOB and light headed, ears & head get hot along w/ some double vision. Her apple watch is not telling her she is in AFib, only that she has a low HR.  As low as the 30s. Aware I will forward to MD for advisement. Informed that even though she is on a low dose of Atenolol, we may hold it to see if symptoms improve. Aware I will call her by next week with his recommendation. Patient verbalized understanding and agreeable to plan.   Pt recent monitor report available in EPIC

## 2022-05-14 ENCOUNTER — Encounter: Payer: Self-pay | Admitting: Family Medicine

## 2022-05-14 ENCOUNTER — Telehealth: Payer: Self-pay

## 2022-05-14 ENCOUNTER — Ambulatory Visit: Payer: Commercial Managed Care - HMO | Admitting: Neurology

## 2022-05-14 ENCOUNTER — Encounter: Payer: Self-pay | Admitting: Neurology

## 2022-05-14 VITALS — BP 109/70 | HR 84 | Ht 67.0 in | Wt 202.0 lb

## 2022-05-14 DIAGNOSIS — G43709 Chronic migraine without aura, not intractable, without status migrainosus: Secondary | ICD-10-CM

## 2022-05-14 DIAGNOSIS — R112 Nausea with vomiting, unspecified: Secondary | ICD-10-CM

## 2022-05-14 MED ORDER — ONDANSETRON 4 MG PO TBDP: 4.0000 mg | ORAL_TABLET | Freq: Three times a day (TID) | ORAL | 2 refills | Status: DC | PRN

## 2022-05-14 NOTE — Telephone Encounter (Signed)
Patient seen in office today. Per Dr.Jaffe patient to start Botox.  PA team please start a PA for Botox 200 units every 90 days.

## 2022-05-14 NOTE — Telephone Encounter (Signed)
Received information on Zio monitor that pt had "pauses". Reviewed by Dr. Delfin Edis. He recommended that pt stop Atenolol. Called pt to advise her of Dr. Wendy Poet advice. She stated that she was feeling fatigued, and sort of breath walking across the room. Encouraged to drink plenty of fluids and eat a balanced diet. Pt agreed to stop Atenolol and call with any problems or issues.

## 2022-05-14 NOTE — Telephone Encounter (Signed)
Pt advised to hold Atenolol. Advised to let us know by end of next week how she is doing off the medication. Patient verbalized understanding and agreeable to plan.

## 2022-05-14 NOTE — Telephone Encounter (Signed)
Submitted Benefits to [employer]- [id number].  Patient enrolled into copay card:

## 2022-05-14 NOTE — Patient Instructions (Signed)
  Plan to start Botox.  If Emgality wasn't effective at all, would discontinue it now.  Otherwise, may continue until you start the Botox.  Continue topiramate for now. Take Zavzpret nasal spray at earliest onset of headache - 1 spray in one nostril (maximum 1 spray in 24 hours).  Let me know if effective. Stop Excedrin.  Rule of thumb:  Limit use of pain relievers to no more than 2 days out of the week.  These medications include acetaminophen, NSAIDs (ibuprofen/Advil/Motrin, naproxen/Aleve, triptans (Imitrex/sumatriptan), Excedrin, and narcotics.  This will help reduce risk of rebound headaches. Be aware of common food triggers:  - Caffeine:  coffee, black tea, cola, Mt. Dew  - Chocolate  - Dairy:  aged cheeses (brie, blue, cheddar, gouda, [place], provolone, [place], Swiss, etc), chocolate milk, buttermilk, sour cream, limit eggs and yogurt  - Nuts, peanut butter  - Alcohol  - Cereals/grains:  FRESH breads (fresh bagels, sourdough, doughnuts), yeast productions  - Processed/canned/aged/cured meats (pre-packaged deli meats, hotdogs)  - MSG/glutamate:  soy sauce, flavor enhancer, pickled/preserved/marinated foods  - Sweeteners:  aspartame (Equal, Nutrasweet).  Sugar and Splenda are okay  - Vegetables:  legumes (lima beans, lentils, snow peas, fava beans, pinto peans, peas, garbanzo beans), sauerkraut, onions, olives, pickles  - Fruit:  avocados, bananas, citrus fruit (orange, lemon, grapefruit), mango  - Other:  Frozen meals, macaroni and cheese Routine exercise Stay adequately hydrated (aim for 64 oz water daily) Keep headache diary Maintain proper stress management Maintain proper sleep hygiene Do not skip meals Consider supplements:  magnesium citrate 400mg  daily, riboflavin 400mg  daily, coenzyme Q10 100mg  three times daily.

## 2022-05-17 ENCOUNTER — Ambulatory Visit: Payer: Commercial Managed Care - HMO | Attending: Cardiology

## 2022-05-17 ENCOUNTER — Telehealth: Payer: Self-pay

## 2022-05-17 VITALS — BP 102/59 | Ht 66.0 in | Wt 225.4 lb

## 2022-05-17 DIAGNOSIS — I480 Paroxysmal atrial fibrillation: Secondary | ICD-10-CM

## 2022-05-17 DIAGNOSIS — I471 Supraventricular tachycardia: Secondary | ICD-10-CM | POA: Diagnosis not present

## 2022-05-17 NOTE — Telephone Encounter (Signed)
NV today for post flecanide increase to 100 mg.  During visit pt reports yesterday 05/16/22 pt passed out.  Eating dinner got up to put dishes away and passed out.  Was out 1-2 min per pt; daughter at home at time of event.  When pt came to was very dizzy HR 38 Did not check BP.  Per pt atenolol recently discontinued. VS at [place] 188/41-66-06.  Pt with SOB.    DOD Dr. Quentin Ore reviewed EKG no change from previous EKG on 04/22/22.  DOD gave the option to decrease Flecanide to 75 mg.  Pt does not want to do this at this time.  Would like to see how recovers since atenolol recently Dc'd.  DOD also request 2 week fu with PA.  Scheduled with [name] for 06/08/22.  Advised pt to continue to monitor BP/HR call into the office if has another syncopal episode.

## 2022-05-17 NOTE — Patient Instructions (Signed)
Medication Instructions:  Your physician recommends that you continue on your current medications as directed. Please refer to the Current Medication list given to you today.  *If you need a refill on your cardiac medications before your next appointment, please call your pharmacy*   Lab Work: NONE If you have labs (blood work) drawn today and your tests are completely normal, you will receive your results only by: [place] (if you have MyChart) OR A paper copy in the mail If you have any lab test that is abnormal or we need to change your treatment, we will call you to review the results.   Testing/Procedures: NONE    Follow-Up: At [facility], you and your health needs are our priority.  As part of our continuing mission to provide you with exceptional heart care, we have created designated Provider Care Teams.  These Care Teams include your primary Cardiologist (physician) and Advanced Practice Providers (APPs -  Physician Assistants and Nurse Practitioners) who all work together to provide you with the care you need, when you need it.  We recommend signing up for the patient portal called "MyChart".  Sign up information is provided on this After Visit Summary.  MyChart is used to connect with patients for Virtual Visits (Telemedicine).  Patients are able to view lab/test results, encounter notes, upcoming appointments, etc.  Non-urgent messages can be sent to your provider as well.   To learn more about what you can do with MyChart, go to [email or web address].    Your next appointment:   2 week(s)  The format for your next appointment:   In Person  Provider:  [name]    Other Instructions Please continue to monitor your BP and HR please call into the office if you have any further episodes of passing out.   Important Information About Sugar

## 2022-05-17 NOTE — Progress Notes (Signed)
   Nurse Visit   Date of Encounter: 05/17/2022 ID: Paula Stout, DOB 07/17/1956, MRN [id number]  PCP:  [name], [place] Providers Cardiologist:  None Electrophysiologist:  [name], MD      Visit Details   VS:  BP (!) 102/59   Ht 5\' 6"  (1.676 m)   Wt 225 lb 6.4 oz (102.2 kg) Comment: weighed X2 on different scales 1st 226.8  BMI 36.38 kg/m  , BMI Body mass index is 36.38 kg/m.  Wt Readings from Last 3 Encounters:  05/17/22 225 lb 6.4 oz (102.2 kg)  05/14/22 202 lb (91.6 kg)  04/26/22 221 lb 8 oz (100.5 kg)     Reason for visit: EKG post Flecanide increase to 100 mg  Performed today: Vitals, EKG, Provider consulted:DOD Dr. Quentin Ore, and Education Changes (medications, testing, etc.) : No changes, f/u with PA in 2 weeks.   Pt reports on 05/16/22 was eating dinner got up to put dishes away and passed out for 1-2 minutes.  When came to felt dizzy HR 38. Length of Visit: 20 minutes    Medications Adjustments/Labs and Tests Ordered: Orders Placed This Encounter  Procedures   EKG 12-Lead   No orders of the defined types were placed in this encounter.    Signed, Precious Gilding, RN  05/17/2022 3:54 PM

## 2022-05-18 ENCOUNTER — Other Ambulatory Visit (HOSPITAL_COMMUNITY): Payer: Self-pay

## 2022-05-18 NOTE — Telephone Encounter (Signed)
Attempted to submit PA for BOTOX 200u to [facility], however request has been cancelled due to drug being excluded from plan [facility] pharmacy benefits. Will complete and submit request form for [facility].

## 2022-05-19 ENCOUNTER — Encounter: Payer: Self-pay | Admitting: Family Medicine

## 2022-05-21 NOTE — Telephone Encounter (Signed)
Followed up w/ pt who reports she is about the same, low HRs but no near syncope/syncope/dizziness. She stopped/held Atenolol on 9/9. She uses Apple Watch, has shown no Afib but still showing low HRs, avg 40-50s. She also continues to feel "like someone punched me in the chest", HR was in the 90s and it "took my breath". Non radiating pain, no edema/wt gain. She is currently out of the state for 2 weeks. Advised if pain returns/worsens/changes in nature to go to ER for evaluation. Aware I will further discuss w/ Dr. Curt Bears next week. Patient verbalized understanding and agreeable to plan.

## 2022-05-24 NOTE — Telephone Encounter (Signed)
Pre-cert form has been completed and faxed in to [facility], will await response.  Phone: [phone] Fax: [phone]

## 2022-05-25 ENCOUNTER — Other Ambulatory Visit: Payer: Self-pay | Admitting: Cardiology

## 2022-05-25 ENCOUNTER — Other Ambulatory Visit: Payer: Self-pay | Admitting: Family Medicine

## 2022-05-25 NOTE — Telephone Encounter (Signed)
Rx refill sent to pharmacy. 

## 2022-05-31 ENCOUNTER — Encounter: Payer: Self-pay | Admitting: Family Medicine

## 2022-05-31 DIAGNOSIS — J0100 Acute maxillary sinusitis, unspecified: Secondary | ICD-10-CM

## 2022-05-31 MED ORDER — PREDNISONE 20 MG PO TABS: 40.0000 mg | ORAL_TABLET | Freq: Every day | ORAL | 0 refills | Status: AC

## 2022-05-31 NOTE — Telephone Encounter (Signed)

## 2022-06-02 ENCOUNTER — Encounter: Payer: Self-pay | Admitting: Cardiology

## 2022-06-02 ENCOUNTER — Other Ambulatory Visit (HOSPITAL_COMMUNITY): Payer: Self-pay

## 2022-06-02 NOTE — Telephone Encounter (Signed)
Patient advised of Approval of Botox 200 units.   Front desk if you could call and schedule patient for Dr.jaffe Botox day.

## 2022-06-02 NOTE — Telephone Encounter (Signed)
Reached out to [place] for update on PA status. Authorization of BOTOX 200 has been APPROVED for [facility] under [id number] [phone]) for 200 units every 3 months for 1 year. Approval dates are 05/26/2022 through 05/25/2023. Request form and approval letter have been sent to scan center for retention.  Auth#: [id number]

## 2022-06-02 NOTE — Telephone Encounter (Signed)
Pt still out of town. States her HR currently is racing.  Aware she may have to go to ED there for evaluation. Pt scheduled to see PA in our office on Tuesday for further evaluation. She appreciates my call.

## 2022-06-02 NOTE — Telephone Encounter (Signed)
Patient is sch for 06-25-22

## 2022-06-03 ENCOUNTER — Telehealth: Payer: Self-pay | Admitting: Cardiology

## 2022-06-03 NOTE — Telephone Encounter (Signed)
Spoke with pt who states that while in [place] she went into [facility]T and went to [facility] where she ws ultimately cardioverted and discharged. Pt is not returning home until 06/07/22. I have requested her records as they are not in [place]. Please advise.  Pt did state that earlier today she had a heart rate of 130 but it resolved.

## 2022-06-03 NOTE — Telephone Encounter (Signed)
Pt states that she was just released from hospital due to having to have her heart shocked. Pt states that she wore a heart monitor but results showed that nothing was wrong. Pt would like a callback as to whether or not Dr. Raliegh Ip would like to see her. Pt has upcoming appt on 10/3 at [place]. Please advise

## 2022-06-04 NOTE — Telephone Encounter (Signed)
Per [name]' request, I have called and faxed a stat record request to [facility] (fax: [phone]) for copies of EKG from hospital stay to be faxed to our office for review.

## 2022-06-06 NOTE — Progress Notes (Unsigned)
Cardiology Office Note Date:  06/06/2022  Patient ID:  Paula Stout, Paula Stout 07/06/1956, MRN [id number] PCP:  [name], DO  Cardiologist:  Dr. Geraldo Pitter >> Dr. Agustin Cree Electrophysiologist: Dr. Curt Bears    Chief Complaint:  *** post ER  History of Present Illness: Paula Stout is a 66 y.o. female with history of DM, HLD, CKD (III), SVT, AFib (has hx of a CTI and PVI ablation Jan 2020 in [place]), obesity, Bipolar d/o, migraine HAs, HTN   June 2021 went to [facility] with palpitations and chest pain.  She was found to be in sinus rhythm on presentation.  Catheterization showed no evidence of coronary artery disease   She saw Dr. Curt Bears,  May 2022, she continued to have CP, was taking s/l NTG with some improvement in her symptoms.  Occurred at rest and exertion, every day Had a neg stress myoview March 2022 Did not think was coronary driven CP, though with some improvement with the NTG started on low dose Imdur  She saw Dr. Agustin Cree 11/27/21, had a recent ER visit (11/06/21) with an SVT that converted with adenosine, associated with CP.  HS Trop were 5, 25, 73 Discussed having CP episodes outside of the SVT as well.  Recommended that she cut her atenolol in 1/2 and take 1/2 tab in the AM and 1/2 tab PM. CP sounded suspicious though as above noted no CAD in 2021, and neg stress test in 2022. Given her flecainide, planned to repeat stress test, unable to pursue CTa 2/2 CKD (baseline 1.6-1.9)  Stress test was normal, low risk  I saw her April 2023 She continues to have palpitations/CP They are not always connected, she can have CP/SOB without tachycardia, but everytime she has tachycardia she has CP/SOB Since her last ER visit and adenosine, seems she was changed from metoprolol to atenolol and so far has not had another SVT, she reports her PMD made that adjustment She felt the the isosorbide did help her CP, but Dr. Geraldo Pitter stopped it. She has not had syncope She has  not had GI eval EKG noted stable intervals. She wanted to discuss with Dr. Curt Bears, perhaps ablation, in effort as well to get off some meds.  She has seen the [facility] clinic and both Drs Curt Bears and Agustin Cree a couple times since then, most recently  Dr. Curt Bears 04/12/22, monitoring noted several short SVT runs, ongoing symptomatic palpitations, Apple watch rates 110s, not AFib and her flecainide increased to 185m BID, planned for f/u EKG  She saw Dr. KAgustin Cree8/17/23, pt c/w reports of palpitations, noting HRs 100's in the evening that were worrisome to her, she reported no symptoms with prior monitor and planned for another  Called 9/6 with dizziness, reported HRs by her watch to the 30's, seems advised to hold her atenolol  Monitor with reports of pauses and Dr. KAgustin Creeagreed, advised to hold atenolol and keep adequately hydrated  Monitor noted mostly nocturnal pauses, of sinus arrest, in f/u with the pt, she reported several negative sleep studies for apnea She reported a syncopal event and several episodes of pounding HRs  RN visit 05/17/22 for f/u EKG after increase of flecainide reviewed with DOD, suggested reduction in flecainide dose 2/2 the pt reported a syncopal event 05/16/22 after getting up from eating to do the dishes.  Reported HR 38, syncope lasted a couple minutes apparently Pt did not want to reduce her flecainide and given and appt to be seen by EP APP.  She sent a  mychart message 9/27 , she had been in ER in [place], adenosine attempts failed and cardioverted her. Our office requested EKGs from [place] for Dr. Curt Bears to review Dr. Agustin Cree ordered another monitor   *** no EKGs yet from [place] *** stop flecainide of off nodal agent *** EP ? *** QT?   AAD Hx Flecainide started Feb 2022    Past Medical History:  Diagnosis Date   A-fib [facility])    ACS (acute coronary syndrome) ([place]) 07/31/2021   Acute cystitis without hematuria 05/14/2017   Last Assessment & Plan:   Formatting of this note might be different from the original. - suprapubic tenderness with symptoms of urinary frequency and urgency and subjective fevers (no fevers recorded on any visit). Recently passed a renal stone, US shows left hydronephrosis which is improving.  - UA negative8/24 but pt is clinically symptomatic - will repeat UA  - start pt on Microbid for 5 days   Adhesive capsulitis of left shoulder 12/16/2016   Anxiety    Asthma without status asthmaticus 02/06/2021   At risk for falls 02/04/2019   Atrial fibrillation ([place]) 09/01/2014   Last Assessment & Plan:  Formatting of this note might be different from the original. A:  Chronic.  Sinus rhythm at this time.  States she was told this during admission at [facility] in the past. P:  On baby aspirin at home will continue.  Low dose metoprolol started.   Bipolar 1 disorder, depressed, moderate ([place]) 02/18/2020   Capsulitis of left shoulder 10/20/2021   Chronic anticoagulation 02/20/2020   Chronic atrial fibrillation ([place]) 02/18/2020   Chronic headache 10/11/2013   Chronic interstitial cystitis 12/28/2005   Chronic migraine without aura, intractable, without status migrainosus 06/24/2017   Colon cancer screening 04/15/2016   Last Assessment & Plan:  Formatting of this note might be different from the original. Will schedule for colonoscopy   Depression, recurrent ([place]) 02/18/2020   Diabetes mellitus ([place])    Dyslipidemia 19/75/8832   Dysmetabolic syndrome X 54/98/2641   GAD (generalized anxiety disorder) 02/18/2020   GERD (gastroesophageal reflux disease) 11/05/2021   Heartburn 04/15/2016   Last Assessment & Plan:  Formatting of this note might be different from the original. Patient was counseled regarding lifestyle modification  Advised to take Prilosec 30 mins before breakfast  Will schedule for EGD   History of atrial fibrillation 06/24/2017   History of posttraumatic stress disorder (PTSD) 10/07/2016   HLD (hyperlipidemia)  09/01/2014   Last Assessment & Plan:  Formatting of this note might be different from the original. A: Chronic. P: Continue statin.   Hyperosmolarity due to secondary diabetes mellitus ([place]) 02/06/2021   Hypertriglyceridemia 02/18/2020   Impaired mobility and activities of daily living 11/14/2015   Irritable bowel syndrome (IBS)    Junctional tachycardia ([place]) 11/05/2021   Kidney stone 02/06/2021   Migraine without aura, not refractory 02/06/2021   Mixed hyperlipidemia 04/06/2021   Last Assessment & Plan:  Formatting of this note might be different from the original. Due for labs with PCP in November   Morbid obesity ([place]) 12/16/2016   Paroxysmal supraventricular tachycardia ([place]) 11/05/2021   Pneumonia, organism unspecified(486) 11/21/2002   Polyneuropathy due to type 2 diabetes mellitus ([place]) 02/06/2021   Primary hypertension 04/06/2021   Last Assessment & Plan:  Formatting of this note might be different from the original. Controlled   Refractory migraine with aura 02/06/2021   Refractory migraine without aura 58/30/9407   Renal colic on left side 68/04/8109  Stage 3 chronic kidney disease ([place]) 02/20/2020   Unspecified adverse effect of other drug, medicinal and biological substance(995.29) 02/17/2006   Urinary tract obstruction 02/06/2021    Past Surgical History:  Procedure Laterality Date   ABDOMINAL HYSTERECTOMY     Fibroids   ATRIAL FIBRILLATION ABLATION     CARDIAC CATHETERIZATION     CARDIOVERSION     CESAREAN SECTION     KNEE SURGERY     LEFT HEART CATH AND CORONARY ANGIOGRAPHY N/A 03/04/2020   Procedure: LEFT HEART CATH AND CORONARY ANGIOGRAPHY;  Surgeon: Belva Crome, MD;  Location: [place] [facility];  Service: Cardiovascular;  Laterality: N/A;   ROTATOR CUFF REPAIR      Current Outpatient Medications  Medication Sig Dispense Refill   acetaminophen (TYLENOL) 500 MG tablet Take 1,000 mg by mouth every 6 (six) hours as needed for mild pain.     albuterol  (VENTOLIN HFA) 108 (90 Base) MCG/ACT inhaler Inhale 2 puffs into the lungs every 4 (four) hours as needed for wheezing. 1 each 2   apixaban (ELIQUIS) 5 MG TABS tablet Take 1 tablet (5 mg total) by mouth 2 (two) times daily. 180 tablet 1   atorvastatin (LIPITOR) 80 MG tablet Take 1 tablet (80 mg total) by mouth daily. 90 tablet 3   B Complex Vitamins (B COMPLEX PO) Take 1 tablet by mouth daily.     blood glucose meter kit and supplies One Touch Ultra  Check 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 One Touch Ultra  Check blood sugars once daily Dx: E11.9) 1 each 0   buPROPion (WELLBUTRIN XL) 300 MG 24 hr tablet Take 1 tablet (300 mg total) by mouth daily. 90 tablet 2   diphenhydrAMINE (BENADRYL) 25 MG tablet Take 25 mg by mouth as needed.     EMGALITY 120 MG/ML SOAJ INJECT THE CONTENTS OF ONE PEN UNDER THE SKIN EVERY MONTH 1 mL 2   EPINEPHrine 0.3 mg/0.3 mL IJ SOAJ injection Inject 0.3 mg into the muscle as needed for anaphylaxis. 1 each 1   ezetimibe (ZETIA) 10 MG tablet TAKE 1 TABLET DAILY 90 tablet 3   fenofibrate 160 MG tablet Take 1 tablet (160 mg total) by mouth daily. 90 tablet 3   flecainide (TAMBOCOR) 100 MG tablet Take 1 tablet (100 mg total) by mouth 2 (two) times daily. 180 tablet 3   glucose blood (ONETOUCH VERIO) test strip Use daily to check blood sugar. (Patient taking differently: 1 each by Other route as needed for other (see instruction). Use daily to check blood sugar.) 100 each 3   isosorbide mononitrate (IMDUR) 30 MG 24 hr tablet TAKE ONE-HALF (1/2) TABLET DAILY 45 tablet 3   levocetirizine (XYZAL) 5 MG tablet TAKE 1 TABLET EVERY EVENING 90 tablet 3   lisinopril (ZESTRIL) 5 MG tablet Take 1 tablet (5 mg total) by mouth daily. 90 tablet 3   Lurasidone HCl 120 MG TABS Take 1 tablet (120 mg total) by mouth at bedtime. 90 tablet 2   nitroGLYCERIN (NITROSTAT) 0.4 MG SL tablet Place 1 tablet (0.4 mg total) under the  tongue every 5 (five) minutes as needed for chest pain. 25 tablet 11   ondansetron (ZOFRAN-ODT) 4 MG disintegrating tablet Take 1 tablet (4 mg total) by mouth every 8 (eight) hours as needed for nausea. 20 tablet 2   pantoprazole (PROTONIX) 40 MG tablet Take 1 tablet (40 mg total) by mouth 2 (two)  times daily before a meal. 180 tablet 1   QUEtiapine (SEROQUEL) 300 MG tablet Take 1 tablet (300 mg total) by mouth at bedtime. 90 tablet 2   topiramate (TOPAMAX) 50 MG tablet TAKE 3 TABLETS TWICE A DAY 397 tablet 11   TRULICITY 1.5 QB/3.4LP SOPN INJECT 1.5 MG INTO THE SKIN AS DIRECTED ONCE A WEEK 6 mL 3   VITAMIN D PO Take 1 tablet by mouth daily.     No current facility-administered medications for this visit.    Allergies:   Bee venom, Onion, Tetanus-diphtheria toxoids td, Sumatriptan, Asa [aspirin], Nsaids, Iodine, Sulfa antibiotics, and Vancomycin   Social History:  The patient  reports that she has never smoked. She has never used smokeless tobacco. She reports that she does not drink alcohol and does not use drugs.   Family History:  The patient's family history includes Bipolar disorder in her daughter and son; Migraines in her daughter, daughter, and son. She was adopted.  ROS:  Please see the history of present illness.    All other systems are reviewed and otherwise negative.   PHYSICAL EXAM:  VS:  There were no vitals taken for this visit. BMI: There is no height or weight on file to calculate BMI. Well nourished, well developed, in no acute distress HEENT: normocephalic, atraumatic Neck: no JVD, carotid bruits or masses Cardiac:  *** RRR; no significant murmurs, no rubs, or gallops Lungs:  *** CTA b/l, no wheezing, rhonchi or rales Abd: soft, nontender MS: no deformity or  atrophy Ext: *** no edema Skin: warm and dry, no rash Neuro:  No gross deficits appreciated Psych: euthymic mood, full affect    EKG:  Done today and reviewed by myself shows  ***  Sept 2023  monitor Summary and conclusions: Pauses noted longest 3.9 seconds total number of pauses 7, majority of those happened during the night.  Mechanism was a sinus arrest  May 2023, monitor Summary and conclusions:  Short episodes of SVT, rest normal, no need to treat   4/13/20232: TTE  1. Left ventricular ejection fraction, by estimation, is 50 to 55%. The  left ventricle has low normal function. The left ventricle has no regional  wall motion abnormalities. There is mild left ventricular hypertrophy.  Left ventricular diastolic  parameters are consistent with Grade II diastolic dysfunction  (pseudonormalization).   2. Right ventricular systolic function is normal. The right ventricular  size is normal. There is normal pulmonary artery systolic pressure.   3. Left atrial size was mildly dilated.   4. Right atrial size was mildly dilated.   5. The mitral valve is normal in structure. No evidence of mitral valve  regurgitation. No evidence of mitral stenosis.   6. Tricuspid valve regurgitation is mild to moderate.   7. The aortic valve is normal in structure. Aortic valve regurgitation is  not visualized. No aortic stenosis is present.   8. The inferior vena cava is normal in size with greater than 50%  respiratory variability, suggesting right atrial pressure of 3 mmHg.    12/03/21: stress myoview The study is normal. The study is low risk.   Left ventricular function is normal. Nuclear stress EF: 61 %. The left ventricular ejection fraction is normal (55-65%). End diastolic cavity size is normal.   Normal blood pressure and normal heart rate response noted during stress.  Myoview 11/25/2020 Nuclear stress EF: 64%. The left ventricular ejection fraction is normal (55-65%). No T wave inversion was noted during stress. There  was no ST segment deviation noted during stress. Defect 1: There is a small defect of mild severity present in the apex location. This is a low risk study.  TTE  02/20/20   1. Left ventricular ejection fraction, by estimation, is 60 to 65%. The  left ventricle has normal function. The left ventricle has no regional  wall motion abnormalities. Left ventricular diastolic parameters are  consistent with Grade I diastolic  dysfunction (impaired relaxation). Elevated left ventricular end-diastolic  pressure.   2. Right ventricular systolic function is normal. The right ventricular  size is normal. There is normal pulmonary artery systolic pressure. The  estimated right ventricular systolic pressure is 37.5 mmHg.   3. The mitral valve is normal in structure. No evidence of mitral valve  regurgitation. No evidence of mitral stenosis.   4. The aortic valve is normal in structure. Aortic valve regurgitation is  not visualized. No aortic stenosis is present.   5. Aortic dilatation noted. 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   6. The inferior vena cava is normal in size with greater than 50%  respiratory variability, suggesting right atrial pressure of 3 mmHg.    LHC 03/04/20 Left dominant coronary anatomy Normal left main Normal LAD that wraps around the left ventricular apex and supplies one half of the posterior interventricular groove territory. Large first diagonal/ramus intermedius that covers the distribution typically supplied by diagonal branches. Nondominant right coronary Dominant circumflex coronary artery.  The circumflex coronary is tortuous including the first obtuse marginal branch. Normal left ventricular end-diastolic pressure.  Ventriculography not performed to limit contrast exposure.   Cardiac monitor 05/07/2020  Max 146 bpm 03:36am, 08/16 Min 52 bpm 05:33am, 08/08 Avg 68 bpm 1.7% PACs, <1% PVCs Predominant rhythm was sinus rhythm 3 short SVT episodes, longest 5 beats All triggered events/symptoms associated with sinus rhythm       Recent Labs: 12/15/2021: ALT 15; BUN 23; Creatinine, Ser 1.97;  Magnesium 1.6; Potassium 4.1; Sodium 139 01/12/2022: Hemoglobin 12.0; Platelets 359; TSH 1.490  12/15/2021: Cholesterol 123; HDL 23.30; LDL Cholesterol 66; Total CHOL/HDL Ratio 5; Triglycerides 169.0; VLDL 33.8   CrCl cannot be calculated (Patient's most recent lab result is older than the maximum 21 days allowed.).   Wt Readings from Last 3 Encounters:  05/17/22 225 lb 6.4 oz (102.2 kg)  05/14/22 202 lb (91.6 kg)  04/26/22 221 lb 8 oz (100.5 kg)     Other studies reviewed: Additional studies/records reviewed today include: summarized above  ASSESSMENT AND PLAN:  AFlutter Paroxysmal Afib PVI/CTI ablation in [place] There is discussion of possibly a second ablation as well, though postponed/deferred 2/2 COVID pandemic CHA2DS2Vasc is 3, on Eliquis, *** appropriately dosed ***   SVT Converted with adenosine EKGs reviewed, short RP ***   CP no CAD by cath 2021, neg stress myoview 2022, and 2023 I***   Disposition: ***   Current medicines are reviewed at length with the patient today.  The patient did not have any concerns regarding medicines.  Venetia Night, PA-C 06/06/2022 6:24 PM     [address] [place] [address] [phone] (office)  [phone] (fax)

## 2022-06-07 ENCOUNTER — Ambulatory Visit: Payer: Commercial Managed Care - HMO | Admitting: Family Medicine

## 2022-06-07 NOTE — Telephone Encounter (Signed)
Has appt to follow up with Cardiology at [facility] -06-08-22

## 2022-06-08 ENCOUNTER — Encounter: Payer: Self-pay | Admitting: Physician Assistant

## 2022-06-08 ENCOUNTER — Ambulatory Visit: Payer: Commercial Managed Care - HMO | Attending: Physician Assistant | Admitting: Physician Assistant

## 2022-06-08 VITALS — BP 114/72 | HR 81 | Ht 66.0 in | Wt 224.0 lb

## 2022-06-08 DIAGNOSIS — Z79899 Other long term (current) drug therapy: Secondary | ICD-10-CM

## 2022-06-08 DIAGNOSIS — I471 Supraventricular tachycardia, unspecified: Secondary | ICD-10-CM | POA: Diagnosis not present

## 2022-06-08 DIAGNOSIS — I480 Paroxysmal atrial fibrillation: Secondary | ICD-10-CM

## 2022-06-08 DIAGNOSIS — I4892 Unspecified atrial flutter: Secondary | ICD-10-CM

## 2022-06-08 DIAGNOSIS — Z5181 Encounter for therapeutic drug level monitoring: Secondary | ICD-10-CM

## 2022-06-08 MED ORDER — METOPROLOL TARTRATE 25 MG PO TABS: 25.0000 mg | ORAL_TABLET | ORAL | 1 refills | Status: DC | PRN

## 2022-06-08 NOTE — Patient Instructions (Signed)
Medication Instructions:   START TAKING: METOPROLOL 25 MG AS NEEDED EVERY 4 HOURS FOR PALPITATIONS   STOP TAKING AND REMOVE THIS MEDICATION FROM YOUR MEDICATION LIST: FLECAINIDE    *If you need a refill on your cardiac medications before your next appointment, please call your pharmacy*   Lab Work: NONE ORDERED  TODAY    If you have labs (blood work) drawn today and your tests are completely normal, you will receive your results only by: [place] (if you have MyChart) OR A paper copy in the mail If you have any lab test that is abnormal or we need to change your treatment, we will call you to review the results.   Testing/Procedures: NONE ORDERED  TODAY    Follow-Up: At [facility], you and your health needs are our priority.  As part of our continuing mission to provide you with exceptional heart care, we have created designated Provider Care Teams.  These Care Teams include your primary Cardiologist (physician) and Advanced Practice Providers (APPs -  Physician Assistants and Nurse Practitioners) who all work together to provide you with the care you need, when you need it.  We recommend signing up for the patient portal called "MyChart".  Sign up information is provided on this After Visit Summary.  MyChart is used to connect with patients for Virtual Visits (Telemedicine).  Patients are able to view lab/test results, encounter notes, upcoming appointments, etc.  Non-urgent messages can be sent to your provider as well.   To learn more about what you can do with MyChart, go to [email or web address].    Your next appointment:  DR CAMNITZ AS SCHEDULED   The format for your next appointment:   In Person  Other Instructions   Important Information About Sugar

## 2022-06-11 ENCOUNTER — Other Ambulatory Visit: Payer: Self-pay | Admitting: Family Medicine

## 2022-06-11 MED ORDER — AMOXICILLIN-POT CLAVULANATE 875-125 MG PO TABS: 1.0000 | ORAL_TABLET | Freq: Two times a day (BID) | ORAL | 0 refills | Status: AC

## 2022-06-14 NOTE — Telephone Encounter (Signed)
Pt scheduled to see Dr. Curt Bears this Friday to further discuss as we await requested EKGs. Pt agreeable to plan

## 2022-06-15 ENCOUNTER — Encounter: Payer: Self-pay | Admitting: Family Medicine

## 2022-06-15 ENCOUNTER — Other Ambulatory Visit (HOSPITAL_COMMUNITY): Payer: Self-pay

## 2022-06-15 ENCOUNTER — Other Ambulatory Visit: Payer: Self-pay

## 2022-06-15 ENCOUNTER — Other Ambulatory Visit: Payer: Self-pay | Admitting: Family Medicine

## 2022-06-15 DIAGNOSIS — G43709 Chronic migraine without aura, not intractable, without status migrainosus: Secondary | ICD-10-CM

## 2022-06-15 MED ORDER — FLUCONAZOLE 150 MG PO TABS: ORAL_TABLET | ORAL | 0 refills | Status: DC

## 2022-06-15 MED ORDER — ONABOTULINUMTOXINA 200 UNITS IJ SOLR: INTRAMUSCULAR | 4 refills | Status: DC

## 2022-06-15 NOTE — Progress Notes (Unsigned)
Botox letter and script sent to [facility] for Botox 200 unit, Inject 155 units IM into multiple site in the face,neck and head once every 90 days.  Note added patient has an appt on 10/20.

## 2022-06-18 ENCOUNTER — Ambulatory Visit (INDEPENDENT_AMBULATORY_CARE_PROVIDER_SITE_OTHER): Payer: Commercial Managed Care - HMO | Admitting: Family Medicine

## 2022-06-18 ENCOUNTER — Ambulatory Visit: Payer: Commercial Managed Care - HMO | Attending: Cardiology | Admitting: Cardiology

## 2022-06-18 ENCOUNTER — Other Ambulatory Visit: Payer: Self-pay

## 2022-06-18 ENCOUNTER — Encounter: Payer: Self-pay | Admitting: Family Medicine

## 2022-06-18 ENCOUNTER — Encounter: Payer: Self-pay | Admitting: Cardiology

## 2022-06-18 VITALS — BP 122/78 | HR 85 | Ht 66.0 in | Wt 218.0 lb

## 2022-06-18 VITALS — BP 120/70 | HR 83 | Temp 97.8°F | Ht 66.0 in | Wt 217.0 lb

## 2022-06-18 DIAGNOSIS — D6869 Other thrombophilia: Secondary | ICD-10-CM | POA: Diagnosis not present

## 2022-06-18 DIAGNOSIS — R051 Acute cough: Secondary | ICD-10-CM

## 2022-06-18 DIAGNOSIS — I480 Paroxysmal atrial fibrillation: Secondary | ICD-10-CM | POA: Diagnosis not present

## 2022-06-18 DIAGNOSIS — Z01818 Encounter for other preprocedural examination: Secondary | ICD-10-CM | POA: Diagnosis not present

## 2022-06-18 DIAGNOSIS — J0100 Acute maxillary sinusitis, unspecified: Secondary | ICD-10-CM | POA: Diagnosis not present

## 2022-06-18 DIAGNOSIS — I471 Supraventricular tachycardia, unspecified: Secondary | ICD-10-CM | POA: Diagnosis not present

## 2022-06-18 MED ORDER — METOPROLOL SUCCINATE ER 50 MG PO TB24: 50.0000 mg | ORAL_TABLET | Freq: Every day | ORAL | 3 refills | Status: DC

## 2022-06-18 MED ORDER — BENZONATATE 200 MG PO CAPS: 200.0000 mg | ORAL_CAPSULE | Freq: Two times a day (BID) | ORAL | 0 refills | Status: DC | PRN

## 2022-06-18 MED ORDER — DOXYCYCLINE HYCLATE 100 MG PO TABS: 100.0000 mg | ORAL_TABLET | Freq: Two times a day (BID) | ORAL | 0 refills | Status: AC

## 2022-06-18 MED ORDER — METHYLPREDNISOLONE ACETATE 80 MG/ML IJ SUSP
80.0000 mg | Freq: Once | INTRAMUSCULAR | Status: AC
  Administered 2022-06-18: 80 mg via INTRAMUSCULAR

## 2022-06-18 NOTE — Addendum Note (Signed)
Addended by: Sharon Seller B on: 06/18/2022 03:59 PM   Modules accepted: Orders

## 2022-06-18 NOTE — Patient Instructions (Signed)
Medication Instructions:  Start Metoprolol Succinate 50 mg daily   *If you need a refill on your cardiac medications before your next appointment, please call your pharmacy*   Lab Work: None  If you have labs (blood work) drawn today and your tests are completely normal, you will receive your results only by: [place] (if you have MyChart) OR A paper copy in the mail If you have any lab test that is abnormal or we need to change your treatment, we will call you to review the results.   Testing/Procedures: Your physician has requested that you have cardiac CT. Cardiac computed tomography (CT) is a painless test that uses an x-ray machine to take clear, detailed pictures of your heart. For further information please visit [email or web address]. Please follow instruction sheet as given.   Your physician has recommended that you have an ablation. Catheter ablation is a medical procedure used to treat some cardiac arrhythmias (irregular heartbeats). During catheter ablation, a long, thin, flexible tube is put into a blood vessel in your groin (upper thigh), or neck. This tube is called an ablation catheter. It is then guided to your heart through the blood vessel. Radio frequency waves destroy small areas of heart tissue where abnormal heartbeats may cause an arrhythmia to start. Please see the instruction sheet given to you today.    Follow-Up: At [facility], you and your health needs are our priority.  As part of our continuing mission to provide you with exceptional heart care, we have created designated Provider Care Teams.  These Care Teams include your primary Cardiologist (physician) and Advanced Practice Providers (APPs -  Physician Assistants and Nurse Practitioners) who all work together to provide you with the care you need, when you need it.  We recommend signing up for the patient portal called "MyChart".  Sign up information is provided on this After Visit Summary.   MyChart is used to connect with patients for Virtual Visits (Telemedicine).  Patients are able to view lab/test results, encounter notes, upcoming appointments, etc.  Non-urgent messages can be sent to your provider as well.   To learn more about what you can do with MyChart, go to [email or web address].    Your next appointment:   Ablation Feb 21. Sherri, Dr. Curt Bears nurse will be contacting you.   Important Information About Sugar

## 2022-06-18 NOTE — Patient Instructions (Signed)
Continue to push fluids, practice good hand hygiene, and cover your mouth if you cough.  If you start having fevers, shaking or shortness of breath, seek immediate care.  OK to take Tylenol 1000 mg (2 extra strength tabs) or 975 mg (3 regular strength tabs) every 6 hours as needed.  If no better in 2 days, take the antibiotic (doxycycline). Don't take it if you are continuing to improve.  Let us know if you need anything.

## 2022-06-18 NOTE — Progress Notes (Signed)
Electrophysiology Office Note   Date:  06/18/2022   ID:  Paula Stout, DOB [birth year], MRN [id number]  PCP:  [name], DO  Cardiologist: Revankar Primary Electrophysiologist:  Latania Bascomb [name], MD    Chief Complaint: Atrial fibrillation   History of Present Illness: Paula Stout is a 66 y.o. female who is being seen today for the evaluation of atrial fibrillation at the request of [name]*. Presenting today for electrophysiology evaluation.  She has a history significant for paroxysmal atrial fibrillation, type 2 diabetes, hyperlipidemia, stage III CKD.  She is post ablation with PVI and CTI in [place] 09/27/2018.  She presented the emergency room of 11/06/2021 with palpitations and was found to have SVT.  She was given adenosine which converted to sinus rhythm.  She had another episode of SVT on a trip to [place].  At this point, she would prefer to avoid further medication management and would like ablation.  Review of her EKG, it appears that she was actually in atrial fibrillation and not SVT.  She did receive adenosine which failed to convert her arrhythmia.  She would like to avoid further medication management.  Today, denies symptoms of palpitations, chest pain, shortness of breath, orthopnea, PND, lower extremity edema, claudication, dizziness, presyncope, syncope, bleeding, or neurologic sequela. The patient is tolerating medications without difficulties.  She continues to have episodic palpitations.  She states her heart rate gets into the 170s to 190s at times.   Past Medical History:  Diagnosis Date   A-fib [facility])    ACS (acute coronary syndrome) ([place]) 07/31/2021   Acute cystitis without hematuria 05/14/2017   Last Assessment & Plan:  Formatting of this note might be different from the original. - suprapubic tenderness with symptoms of urinary frequency and urgency and subjective fevers (no fevers recorded on any visit). Recently passed a  renal stone, US shows left hydronephrosis which is improving.  - UA negative8/24 but pt is clinically symptomatic - Kresha Abelson repeat UA  - start pt on Microbid for 5 days   Adhesive capsulitis of left shoulder 12/16/2016   Anxiety    Asthma without status asthmaticus 02/06/2021   At risk for falls 02/04/2019   Atrial fibrillation ([place]) 09/01/2014   Last Assessment & Plan:  Formatting of this note might be different from the original. A:  Chronic.  Sinus rhythm at this time.  States she was told this during admission at [facility] in the past. P:  On baby aspirin at home Persephanie Laatsch continue.  Low dose metoprolol started.   Bipolar 1 disorder, depressed, moderate ([place]) 02/18/2020   Capsulitis of left shoulder 10/20/2021   Chronic anticoagulation 02/20/2020   Chronic atrial fibrillation ([place]) 02/18/2020   Chronic headache 10/11/2013   Chronic interstitial cystitis 12/28/2005   Chronic migraine without aura, intractable, without status migrainosus 06/24/2017   Colon cancer screening 04/15/2016   Last Assessment & Plan:  Formatting of this note might be different from the original. Ovie Eastep schedule for colonoscopy   Depression, recurrent ([place]) 02/18/2020   Diabetes mellitus ([place])    Dyslipidemia 76/19/5093   Dysmetabolic syndrome X 26/71/2458   GAD (generalized anxiety disorder) 02/18/2020   GERD (gastroesophageal reflux disease) 11/05/2021   Heartburn 04/15/2016   Last Assessment & Plan:  Formatting of this note might be different from the original. Patient was counseled regarding lifestyle modification  Advised to take Prilosec 30 mins before breakfast  Deysi Soldo schedule for EGD   History of atrial fibrillation 06/24/2017   History  of posttraumatic stress disorder (PTSD) 10/07/2016   HLD (hyperlipidemia) 09/01/2014   Last Assessment & Plan:  Formatting of this note might be different from the original. A: Chronic. P: Continue statin.   Hyperosmolarity due to secondary diabetes mellitus ([place]) 02/06/2021    Hypertriglyceridemia 02/18/2020   Impaired mobility and activities of daily living 11/14/2015   Irritable bowel syndrome (IBS)    Junctional tachycardia 11/05/2021   Kidney stone 02/06/2021   Migraine without aura, not refractory 02/06/2021   Mixed hyperlipidemia 04/06/2021   Last Assessment & Plan:  Formatting of this note might be different from the original. Due for labs with PCP in November   Morbid obesity ([place]) 12/16/2016   Paroxysmal supraventricular tachycardia 11/05/2021   Pneumonia, organism unspecified(486) 11/21/2002   Polyneuropathy due to type 2 diabetes mellitus ([place]) 02/06/2021   Primary hypertension 04/06/2021   Last Assessment & Plan:  Formatting of this note might be different from the original. Controlled   Refractory migraine with aura 02/06/2021   Refractory migraine without aura 02/40/9735   Renal colic on left side 32/99/2426   Stage 3 chronic kidney disease ([place]) 02/20/2020   Unspecified adverse effect of other drug, medicinal and biological substance(995.29) 02/17/2006   Urinary tract obstruction 02/06/2021   Past Surgical History:  Procedure Laterality Date   ABDOMINAL HYSTERECTOMY     Fibroids   ATRIAL FIBRILLATION ABLATION     CARDIAC CATHETERIZATION     CARDIOVERSION     CESAREAN SECTION     KNEE SURGERY     LEFT HEART CATH AND CORONARY ANGIOGRAPHY N/A 03/04/2020   Procedure: LEFT HEART CATH AND CORONARY ANGIOGRAPHY;  Surgeon: Belva Crome, MD;  Location: [facility];  Service: Cardiovascular;  Laterality: N/A;   ROTATOR CUFF REPAIR       Current Outpatient Medications  Medication Sig Dispense Refill   acetaminophen (TYLENOL) 500 MG tablet Take 1,000 mg by mouth every 6 (six) hours as needed for mild pain.     albuterol (VENTOLIN HFA) 108 (90 Base) MCG/ACT inhaler Inhale 2 puffs into the lungs every 4 (four) hours as needed for wheezing. 1 each 2   amoxicillin-clavulanate (AUGMENTIN) 875-125 MG tablet Take 1 tablet by mouth 2 (two) times  daily for 7 days. 14 tablet 0   apixaban (ELIQUIS) 5 MG TABS tablet Take 1 tablet (5 mg total) by mouth 2 (two) times daily. 180 tablet 1   atorvastatin (LIPITOR) 80 MG tablet Take 1 tablet (80 mg total) by mouth daily. 90 tablet 3   B Complex Vitamins (B COMPLEX PO) Take 1 tablet by mouth daily.     blood glucose meter kit and supplies One Touch Ultra  Check 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 One Touch Ultra  Check blood sugars once daily Dx: E11.9) 1 each 0   botulinum toxin Type A (BOTOX) 200 units injection Inject 155 units IM into multiple site in the face,neck and head once every 90 days 1 each 4   buPROPion (WELLBUTRIN XL) 300 MG 24 hr tablet Take 1 tablet (300 mg total) by mouth daily. 90 tablet 2   diphenhydrAMINE (BENADRYL) 25 MG tablet Take 25 mg by mouth as needed.     EMGALITY 120 MG/ML SOAJ INJECT THE CONTENTS OF ONE PEN UNDER THE SKIN EVERY MONTH 1 mL 2   EPINEPHrine 0.3 mg/0.3 mL IJ SOAJ injection Inject 0.3 mg into the muscle as needed for anaphylaxis. 1  each 1   ezetimibe (ZETIA) 10 MG tablet TAKE 1 TABLET DAILY 90 tablet 3   fenofibrate 160 MG tablet Take 1 tablet (160 mg total) by mouth daily. 90 tablet 3   fluconazole (DIFLUCAN) 150 MG tablet Take 1 tab, repeat in 72 hours if no improvement. 2 tablet 0   glucose blood (ONETOUCH VERIO) test strip Use daily to check blood sugar. (Patient taking differently: 1 each by Other route as needed for other (see instruction). Use daily to check blood sugar.) 100 each 3   isosorbide mononitrate (IMDUR) 30 MG 24 hr tablet TAKE ONE-HALF (1/2) TABLET DAILY 45 tablet 3   levocetirizine (XYZAL) 5 MG tablet TAKE 1 TABLET EVERY EVENING 90 tablet 3   lisinopril (ZESTRIL) 5 MG tablet Take 1 tablet (5 mg total) by mouth daily. 90 tablet 3   Lurasidone HCl 120 MG TABS Take 1 tablet (120 mg total) by mouth at bedtime. 90 tablet 2   metoprolol succinate (TOPROL-XL) 50 MG 24 hr  tablet Take 1 tablet (50 mg total) by mouth daily. Take with or immediately following a meal. 90 tablet 3   metoprolol tartrate (LOPRESSOR) 25 MG tablet Take 1 tablet (25 mg total) by mouth every 4 (four) hours as needed (PALPITATIONS). 90 tablet 1   nitroGLYCERIN (NITROSTAT) 0.4 MG SL tablet Place 1 tablet (0.4 mg total) under the tongue every 5 (five) minutes as needed for chest pain. 25 tablet 11   ondansetron (ZOFRAN-ODT) 4 MG disintegrating tablet Take 1 tablet (4 mg total) by mouth every 8 (eight) hours as needed for nausea. 20 tablet 2   pantoprazole (PROTONIX) 40 MG tablet Take 1 tablet (40 mg total) by mouth 2 (two) times daily before a meal. 180 tablet 1   QUEtiapine (SEROQUEL) 300 MG tablet Take 1 tablet (300 mg total) by mouth at bedtime. 90 tablet 2   topiramate (TOPAMAX) 50 MG tablet TAKE 3 TABLETS TWICE A DAY 623 tablet 11   TRULICITY 1.5 JS/2.8BT SOPN INJECT 1.5 MG INTO THE SKIN AS DIRECTED ONCE A WEEK 6 mL 3   VITAMIN D PO Take 1 tablet by mouth daily.     No current facility-administered medications for this visit.    Allergies:   Bee venom, Onion, Tetanus-diphtheria toxoids td, Sumatriptan, Asa [aspirin], Iodine, Nsaids, Sulfa antibiotics, and Vancomycin   Social History:  The patient  reports that she has never smoked. She has never used smokeless tobacco. She reports that she does not drink alcohol and does not use drugs.   Family History:  The patient's family history includes Bipolar disorder in her daughter and son; Migraines in her daughter, daughter, and son. She was adopted.   ROS:  Please see the history of present illness.   Otherwise, review of systems is positive for none.   All other systems are reviewed and negative.   PHYSICAL EXAM: VS:  BP 122/78   Pulse 85   Ht '5\' 6"'  (1.676 m)   Wt 218 lb (98.9 kg)   SpO2 97%   BMI 35.19 kg/m  , BMI Body mass index is 35.19 kg/m. GEN: Well nourished, well developed, in no acute distress  HEENT: normal  Neck: no JVD,  carotid bruits, or masses Cardiac: RRR; no murmurs, rubs, or gallops,no edema  Respiratory:  clear to auscultation bilaterally, normal work of breathing GI: soft, nontender, nondistended, + BS MS: no deformity or atrophy  Skin: warm and dry Neuro:  Strength and sensation are intact Psych: euthymic mood, full  affect  EKG:  EKG is not ordered today. Personal review of the ekg ordered 06/08/22 shows sinus rhythm  Recent Labs: 12/15/2021: ALT 15; BUN 23; Creatinine, Ser 1.97; Magnesium 1.6; Potassium 4.1; Sodium 139 01/12/2022: Hemoglobin 12.0; Platelets 359; TSH 1.490    Lipid Panel     Component Value Date/Time   CHOL 123 12/15/2021 1023   CHOL 141 02/10/2021 0914   TRIG 169.0 (H) 12/15/2021 1023   HDL 23.30 (L) 12/15/2021 1023   HDL 26 (L) 02/10/2021 0914   CHOLHDL 5 12/15/2021 1023   VLDL 33.8 12/15/2021 1023   LDLCALC 66 12/15/2021 1023   LDLCALC 86 02/10/2021 0914     Wt Readings from Last 3 Encounters:  06/18/22 218 lb (98.9 kg)  06/08/22 224 lb (101.6 kg)  05/17/22 225 lb 6.4 oz (102.2 kg)      Other studies Reviewed: Additional studies/ records that were reviewed today include: TTE 02/20/20  Review of the above records today demonstrates:   1. Left ventricular ejection fraction, by estimation, is 60 to 65%. The  left ventricle has normal function. The left ventricle has no regional  wall motion abnormalities. Left ventricular diastolic parameters are  consistent with Grade I diastolic  dysfunction (impaired relaxation). Elevated left ventricular end-diastolic  pressure.   2. Right ventricular systolic function is normal. The right ventricular  size is normal. There is normal pulmonary artery systolic pressure. The  estimated right ventricular systolic pressure is 16.1 mmHg.   3. The mitral valve is normal in structure. No evidence of mitral valve  regurgitation. No evidence of mitral stenosis.   4. The aortic valve is normal in structure. Aortic valve  regurgitation is  not visualized. No aortic stenosis is present.   5. Aortic dilatation noted. 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   6. The inferior vena cava is normal in size with greater than 50%  respiratory variability, suggesting right atrial pressure of 3 mmHg.   LHC 03/04/20 Left dominant coronary anatomy Normal left main Normal LAD that wraps around the left ventricular apex and supplies one half of the posterior interventricular groove territory. Large first diagonal/ramus intermedius that covers the distribution typically supplied by diagonal branches. Nondominant right coronary Dominant circumflex coronary artery.  The circumflex coronary is tortuous including the first obtuse marginal branch. Normal left ventricular end-diastolic pressure.  Ventriculography not performed to limit contrast exposure.  Cardiac monitor 05/07/2020 personally reviewed Max 146 bpm 03:36am, 08/16 Min 52 bpm 05:33am, 08/08 Avg 68 bpm 1.7% PACs, <1% PVCs Predominant rhythm was sinus rhythm 3 short SVT episodes, longest 5 beats All triggered events/symptoms associated with sinus rhythm  Myoview 11/25/2020 Nuclear stress EF: 64%. The left ventricular ejection fraction is normal (55-65%). No T wave inversion was noted during stress. There was no ST segment deviation noted during stress. Defect 1: There is a small defect of mild severity present in the apex location. This is a low risk study.   ASSESSMENT AND PLAN:  1.  Paroxysmal atrial fibrillation: Currently on Eliquis 5 mg twice daily.  CHA2DS2-VASc of 2.  Status post ablation with PVI and CTI in [place] 09/27/2018.  She taken off of her flecainide in [place].  At this point, she would prefer a rhythm control strategy.  We Dimarco Minkin plan for ablation.  Risk, benefits, and alternatives to EP study and radiofrequency ablation for afib were also discussed in detail today. These risks include but are not limited to stroke,  bleeding, vascular  damage, tamponade, perforation, damage to the esophagus, lungs, and other structures, pulmonary vein stenosis, worsening renal function, and death. The patient understands these risk and wishes to proceed.  We Shonteria Abeln therefore proceed with catheter ablation at the next available time.  Carto, ICE, anesthesia are requested for the procedure.  Samanthan Dugo also obtain CT PV protocol prior to the procedure to exclude LAA thrombus and further evaluate atrial anatomy.   2.  Hyperlipidemia: Continue atorvastatin per primary cardiology  3.  SVT: An episode converted with adenosine.  At this point, she would prefer to avoid further medication management.  Due to that, plan for ablation.  Risk and benefits of been discussed.  Risk of bleeding, tamponade, heart block, stroke, damage to surrounding organs.  She understands these risks and is agreed to the procedure.  We Jadalynn Burr plan for full EP study around the time of her ablation for atrial fibrillation.  4.  Secondary hypercoagulable state: Currently on Eliquis for atrial fibrillation as above   Current medicines are reviewed at length with the patient today.   The patient does not have concerns regarding her medicines.  The following changes were made today: Start Toprol-XL  Labs/ tests ordered today include:  Orders Placed This Encounter  Procedures   CT CARDIAC MORPH/PULM VEIN W/CM&W/O CA SCORE   CBC w/Diff   Basic Metabolic Panel (BMET)     Disposition:   FU with Karisa Nesser 3 months  Signed, Octavion Mollenkopf [name], MD  06/18/2022 12:04 PM     [facility] [address] [place] [address] [phone] (office) [phone] (fax)

## 2022-06-18 NOTE — Progress Notes (Signed)
Chief Complaint  Patient presents with   Sinus Problem   Cough    No better     Paula Stout here for URI complaints.  Duration:  2.5  weeks  Associated symptoms: sinus congestion, sinus pain, rhinorrhea, ear pain, and cough Denies: itchy watery eyes, ear drainage, sore throat, wheezing, shortness of breath, myalgia, and fevers Treatment to date: Dayquil, Nyquil, prednisone, Augmentin Sick contacts: No Tested neg for covid. Reports she is 25% improved.   Past Medical History:  Diagnosis Date   A-fib [facility])    ACS (acute coronary syndrome) ([place]) 07/31/2021   Acute cystitis without hematuria 05/14/2017   Last Assessment & Plan:  Formatting of this note might be different from the original. - suprapubic tenderness with symptoms of urinary frequency and urgency and subjective fevers (no fevers recorded on any visit). Recently passed a renal stone, US shows left hydronephrosis which is improving.  - UA negative8/24 but pt is clinically symptomatic - will repeat UA  - start pt on Microbid for 5 days   Adhesive capsulitis of left shoulder 12/16/2016   Anxiety    Asthma without status asthmaticus 02/06/2021   At risk for falls 02/04/2019   Atrial fibrillation ([place]) 09/01/2014   Last Assessment & Plan:  Formatting of this note might be different from the original. A:  Chronic.  Sinus rhythm at this time.  States she was told this during admission at [facility] in the past. P:  On baby aspirin at home will continue.  Low dose metoprolol started.   Bipolar 1 disorder, depressed, moderate ([place]) 02/18/2020   Capsulitis of left shoulder 10/20/2021   Chronic anticoagulation 02/20/2020   Chronic atrial fibrillation ([place]) 02/18/2020   Chronic headache 10/11/2013   Chronic interstitial cystitis 12/28/2005   Chronic migraine without aura, intractable, without status migrainosus 06/24/2017   Colon cancer screening 04/15/2016   Last Assessment & Plan:  Formatting of this note might be different from  the original. Will schedule for colonoscopy   Depression, recurrent ([place]) 02/18/2020   Diabetes mellitus ([place])    Dyslipidemia 44/96/7591   Dysmetabolic syndrome X 63/84/6659   GAD (generalized anxiety disorder) 02/18/2020   GERD (gastroesophageal reflux disease) 11/05/2021   Heartburn 04/15/2016   Last Assessment & Plan:  Formatting of this note might be different from the original. Patient was counseled regarding lifestyle modification  Advised to take Prilosec 30 mins before breakfast  Will schedule for EGD   History of atrial fibrillation 06/24/2017   History of posttraumatic stress disorder (PTSD) 10/07/2016   HLD (hyperlipidemia) 09/01/2014   Last Assessment & Plan:  Formatting of this note might be different from the original. A: Chronic. P: Continue statin.   Hyperosmolarity due to secondary diabetes mellitus ([place]) 02/06/2021   Hypertriglyceridemia 02/18/2020   Impaired mobility and activities of daily living 11/14/2015   Irritable bowel syndrome (IBS)    Junctional tachycardia 11/05/2021   Kidney stone 02/06/2021   Migraine without aura, not refractory 02/06/2021   Mixed hyperlipidemia 04/06/2021   Last Assessment & Plan:  Formatting of this note might be different from the original. 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 Controlled   Refractory migraine with aura 02/06/2021  Refractory migraine without aura 97/10/6376   Renal colic on left side 58/85/0277   Stage 3 chronic kidney disease ([place]) 02/20/2020   Unspecified adverse effect of other drug, medicinal and biological substance(995.29) 02/17/2006   Urinary tract obstruction 02/06/2021    Objective BP  120/70 (BP Location: Left Arm, Patient Position: Sitting, Cuff Size: Normal)   Pulse 83   Temp 97.8 F (36.6 C) (Oral)   Ht 5\' 6"  (1.676 m)   Wt 217 lb (98.4 kg)   SpO2 97%   BMI 35.02 kg/m  General: Awake, alert, appears stated age HEENT: AT, [place], ears patent b/l and TM's neg, nares patent w/o discharge, pharynx pink and without exudates, MMM, TTP over the right maxillary sinus Neck: No masses or asymmetry Heart: RRR Lungs: CTAB, no accessory muscle use Psych: Age appropriate judgment and insight, normal mood and affect  Acute maxillary sinusitis, recurrence not specified - Plan: doxycycline (VIBRA-TABS) 100 MG tablet  Acute cough - Plan: benzonatate (TESSALON) 200 MG capsule, doxycycline (VIBRA-TABS) 100 MG tablet  Tessalon Perles for the cough, Depo-Medrol 80 mg IM today.  If no improvement in the next 2 days, she will take 7 days of doxycycline.  Continue to push fluids, practice good hand hygiene, cover mouth when coughing. F/u prn. If starting to experience fevers, shaking, or shortness of breath, seek immediate care. Pt voiced understanding and agreement to the plan.  [place], DO 06/18/22 3:48 PM

## 2022-06-23 ENCOUNTER — Telehealth: Payer: Self-pay | Admitting: Cardiology

## 2022-06-23 MED ORDER — METOPROLOL SUCCINATE ER 50 MG PO TB24: 50.0000 mg | ORAL_TABLET | Freq: Every day | ORAL | 3 refills | Status: DC

## 2022-06-23 NOTE — Telephone Encounter (Signed)
Pt access agent is calling to get clarification on medication on behalf of the pt.   Pt c/o medication issue:  1. Name of Medication: metoprolol 50 mg  2. How are you currently taking this medication (dosage and times per day)? Need clarification   3. Are you having a reaction (difficulty breathing--STAT)? No   4. What is your medication issue? Need clarification on medication

## 2022-06-23 NOTE — Telephone Encounter (Signed)
Pt c/o medication issue:  1. Name of Medication:  metoprolol tartrate (LOPRESSOR) 25 MG tablet   2. How are you currently taking this medication (dosage and times per day)?   3. Are you having a reaction (difficulty breathing--STAT)?   4. What is your medication issue?   Patient states [facility] informed her that they have made several attempts to contact our office for clarification on Metoprolol instructions.  Please assist and contact patient to confirm instructions have been corrected.

## 2022-06-23 NOTE — Telephone Encounter (Signed)
Spoke with the patient and advised that I do not see any notes in regards to [facility] trying to contact us, I tried to contact [facility] but was unable to get a hold of anyone. Patient was not given a direct number to call them at. She states that medication can be sent to [facility] instead.

## 2022-06-23 NOTE — Telephone Encounter (Signed)
Spoke with [facility] and advised they can cancel order as patient is going to pick up medication from [facility].

## 2022-06-25 ENCOUNTER — Ambulatory Visit: Payer: Commercial Managed Care - HMO | Admitting: Neurology

## 2022-06-25 DIAGNOSIS — G43709 Chronic migraine without aura, not intractable, without status migrainosus: Secondary | ICD-10-CM | POA: Diagnosis not present

## 2022-06-25 MED ORDER — ONABOTULINUMTOXINA 100 UNITS IJ SOLR
200.0000 [IU] | Freq: Once | INTRAMUSCULAR | Status: AC
  Administered 2022-06-25: 155 [IU] via INTRAMUSCULAR

## 2022-06-25 NOTE — Progress Notes (Signed)
[facility]  ° °Procedure Note Botox ° °Attending: Dr. Khaiden Segreto ° °Preoperative Diagnosis(es): Chronic migraine ° °Consent obtained from: The patient °Benefits discussed included, but were not limited to decreased muscle tightness, increased joint range of motion, and decreased pain.  Risk discussed included, but were not limited pain and discomfort, bleeding, bruising, excessive weakness, venous thrombosis, muscle atrophy and dysphagia.  Anticipated outcomes of the procedure as well as he risks and benefits of the alternatives to the procedure, and the roles and tasks of the personnel to be involved, were discussed with the patient, and the patient consents to the procedure and agrees to proceed. A copy of the patient medication guide was given to the patient which explains the blackbox warning. ° °Patients identity and treatment sites confirmed Yes.  . ° °Details of Procedure: °Skin was cleaned with alcohol. Prior to injection, the needle plunger was aspirated to make sure the needle was not within a blood vessel.  There was no blood retrieved on aspiration.   ° °Following is a summary of the muscles injected  And the amount of Botulinum toxin used: ° °Dilution °200 units of Botox was reconstituted with 4 ml of preservative free normal saline. °Time of reconstitution: At the time of the office visit (<30 minutes prior to injection)  ° °Injections  °155 total units of Botox was injected with a 30 gauge needle. ° °Injection Sites: °L occipitalis: 15 units- 3 sites  °R occiptalis: 15 units- 3 sites ° °L upper trapezius: 15 units- 3 sites °R upper trapezius: 15 units- 3 sits          °L paraspinal: 10 units- 2 sites °R paraspinal: 10 units- 2 sites ° °Face °L frontalis(2 injection sites):10 units   °R frontalis(2 injection sites):10 units         °L corrugator: 5 units   °R corrugator: 5 units           °Procerus: 5 units   °L temporalis: 20 units °R temporalis: 20 units  ° °Agent:  °200 units of botulinum Type  A (Onobotulinum Toxin type A) was reconstituted with 4 ml of preservative free normal saline.  °Time of reconstitution: At the time of the office visit (<30 minutes prior to injection)  ° ° ° Total injected (Units):  155 ° Total wasted (Units):  45 ° °Patient tolerated procedure well without complications.   °Reinjection is anticipated in 3 months. ° ° °

## 2022-07-08 ENCOUNTER — Encounter (HOSPITAL_BASED_OUTPATIENT_CLINIC_OR_DEPARTMENT_OTHER): Payer: Self-pay

## 2022-07-08 ENCOUNTER — Emergency Department (HOSPITAL_BASED_OUTPATIENT_CLINIC_OR_DEPARTMENT_OTHER): Payer: Commercial Managed Care - HMO

## 2022-07-08 ENCOUNTER — Emergency Department (HOSPITAL_BASED_OUTPATIENT_CLINIC_OR_DEPARTMENT_OTHER)
Admission: EM | Admit: 2022-07-08 | Discharge: 2022-07-08 | Disposition: A | Discharge status: Home / Self Care | Payer: Commercial Managed Care - HMO | Attending: Emergency Medicine | Admitting: Emergency Medicine

## 2022-07-08 ENCOUNTER — Other Ambulatory Visit: Payer: Self-pay

## 2022-07-08 DIAGNOSIS — R110 Nausea: Secondary | ICD-10-CM | POA: Diagnosis not present

## 2022-07-08 DIAGNOSIS — R0602 Shortness of breath: Secondary | ICD-10-CM | POA: Diagnosis not present

## 2022-07-08 DIAGNOSIS — I4719 Other supraventricular tachycardia: Secondary | ICD-10-CM | POA: Insufficient documentation

## 2022-07-08 DIAGNOSIS — Z7901 Long term (current) use of anticoagulants: Secondary | ICD-10-CM | POA: Diagnosis not present

## 2022-07-08 DIAGNOSIS — J45909 Unspecified asthma, uncomplicated: Secondary | ICD-10-CM | POA: Diagnosis not present

## 2022-07-08 DIAGNOSIS — R002 Palpitations: Secondary | ICD-10-CM

## 2022-07-08 DIAGNOSIS — R072 Precordial pain: Secondary | ICD-10-CM

## 2022-07-08 DIAGNOSIS — E1122 Type 2 diabetes mellitus with diabetic chronic kidney disease: Secondary | ICD-10-CM | POA: Diagnosis not present

## 2022-07-08 DIAGNOSIS — N189 Chronic kidney disease, unspecified: Secondary | ICD-10-CM | POA: Diagnosis not present

## 2022-07-08 LAB — BASIC METABOLIC PANEL
Anion gap: 7 (ref 5–15)
BUN: 23 mg/dL (ref 8–23)
CO2: 24 mmol/L (ref 22–32)
Calcium: 9.4 mg/dL (ref 8.9–10.3)
Chloride: 111 mmol/L (ref 98–111)
Creatinine, Ser: 2.08 mg/dL — ABNORMAL HIGH (ref 0.44–1.00)
GFR, Estimated: 26 mL/min — ABNORMAL LOW (ref >60)
Glucose, Bld: 164 mg/dL — ABNORMAL HIGH (ref 70–99)
Potassium: 4.2 mmol/L (ref 3.5–5.1)
Sodium: 142 mmol/L (ref 135–145)

## 2022-07-08 LAB — MAGNESIUM: Magnesium: 1.7 mg/dL (ref 1.7–2.4)

## 2022-07-08 LAB — CBC
HCT: 40.2 % (ref 36.0–46.0)
Hemoglobin: 12.9 g/dL (ref 12.0–15.0)
MCH: 30.6 pg (ref 26.0–34.0)
MCHC: 32.1 g/dL (ref 30.0–36.0)
MCV: 95.5 fL (ref 80.0–100.0)
Platelets: 366 10*3/uL (ref 150–400)
RBC: 4.21 MIL/uL (ref 3.87–5.11)
RDW: 14.1 % (ref 11.5–15.5)
WBC: 7.6 10*3/uL (ref 4.0–10.5)
nRBC: 0 % (ref 0.0–0.2)

## 2022-07-08 LAB — TROPONIN I (HIGH SENSITIVITY)
Troponin I (High Sensitivity): 5 ng/L (ref <18)
Troponin I (High Sensitivity): 5 ng/L (ref <18)

## 2022-07-08 LAB — TSH: TSH: 1.272 u[IU]/mL (ref 0.350–4.500)

## 2022-07-08 LAB — BRAIN NATRIURETIC PEPTIDE: B Natriuretic Peptide: 156.8 pg/mL — ABNORMAL HIGH (ref 0.0–100.0)

## 2022-07-08 MED ORDER — FENTANYL CITRATE PF 50 MCG/ML IJ SOSY
50.0000 ug | PREFILLED_SYRINGE | Freq: Once | INTRAMUSCULAR | Status: AC
  Administered 2022-07-08: 50 ug via INTRAVENOUS
  Filled 2022-07-08: qty 1

## 2022-07-08 MED ORDER — SODIUM CHLORIDE 0.9 % IV BOLUS
500.0000 mL | Freq: Once | INTRAVENOUS | Status: AC
  Administered 2022-07-08: 500 mL via INTRAVENOUS

## 2022-07-08 MED ORDER — ADENOSINE 6 MG/2ML IV SOLN
6.0000 mg | Freq: Once | INTRAVENOUS | Status: AC
  Administered 2022-07-08: 6 mg via INTRAVENOUS
  Filled 2022-07-08: qty 2

## 2022-07-08 NOTE — ED Notes (Signed)
Patient HR converted back to NSR. No difficulties during adenosine administration. C/o continued chest pain however palpitations have resolved. HR in 70's. BP stable.

## 2022-07-08 NOTE — ED Notes (Signed)
Pt provided with graham crackers and water. Ambulatory to restroom

## 2022-07-08 NOTE — ED Triage Notes (Signed)
States was woken up at 0600 with severe midsternal chest pain, shortness of breath and nausea. Hx of SVT & A.fib. On eliquis.

## 2022-07-08 NOTE — ED Notes (Signed)
Pt provided with water per request with Dr. Sherry Ruffing approval.

## 2022-07-08 NOTE — ED Notes (Signed)
Called lab to add on Magnesium & BNP to previously collected labs

## 2022-07-08 NOTE — ED Notes (Signed)
Pt attached to Zoll pads for adenosine administration

## 2022-07-08 NOTE — ED Notes (Signed)
Pt ambulatory to restroom, HR increased to 150 upon return to room.

## 2022-07-08 NOTE — Discharge Instructions (Addendum)
Your history, exam, work-up today are most consistent with recurrent tachycardia arrhythmia leading to your chest pain and symptoms.  Your labs are overall reassuring as we discussed and after I spoke to the electrophysiology team, they requested attempt at cardioversion with adenosine.  It was successful.  You follow-up in clinic within the next week and also change your medications to take the Toprol 50 mg twice a day instead of once a day.  They also will discuss with Dr. Curt Bears about trying to move it.  Procedure if possible.  If any symptoms change or worsen acutely, please return to the nearest emergency department

## 2022-07-08 NOTE — ED Notes (Signed)
Pt stable at time of discharge. No distress noted. AVS reviewed. Pt voiced understanding and has no further questions at this time.

## 2022-07-08 NOTE — ED Provider Notes (Signed)
[facility] Provider Note   CSN: [id number] Arrival date & time: 07/08/22  0710     History  Chief Complaint  Patient presents with   Chest Pain    Paula Stout is a 66 y.o. female.  The history is provided by the patient and medical records. No language interpreter was used.  Chest Pain Pain location:  Substernal area Pain quality: aching, crushing and pressure   Pain radiates to:  L shoulder Pain severity:  Severe Onset quality:  Sudden Duration:  1 hour Timing:  Constant Progression:  Unchanged Chronicity:  Recurrent Relieved by:  Nothing Worsened by:  Exertion Ineffective treatments:  None tried Associated symptoms: nausea, palpitations and shortness of breath   Associated symptoms: no abdominal pain, no back pain, no cough, no dizziness, no fatigue, no fever, no headache, no lower extremity edema, no near-syncope, no numbness, no vomiting and no weakness        Home Medications Prior to Admission medications   Medication Sig Start Date End Date Taking? Authorizing Provider  acetaminophen (TYLENOL) 500 MG tablet Take 1,000 mg by mouth every 6 (six) hours as needed for mild pain.    [provider]  albuterol (VENTOLIN HFA) 108 (90 Base) MCG/ACT inhaler Inhale 2 puffs into the lungs every 4 (four) hours as needed for wheezing. 06/23/20   Wendling, Crosby Oyster, DO  apixaban (ELIQUIS) 5 MG TABS tablet Take 1 tablet (5 mg total) by mouth 2 (two) times daily. 04/02/22   Camnitz, Ocie Doyne, MD  atorvastatin (LIPITOR) 80 MG tablet Take 1 tablet (80 mg total) by mouth daily. 12/07/21   [name], DO  B Complex Vitamins (B COMPLEX PO) Take 1 tablet by mouth daily.    [provider]  benzonatate (TESSALON) 200 MG capsule Take 1 capsule (200 mg total) by mouth 2 (two) times daily as needed for cough. 06/18/22   [name], DO  blood glucose meter kit and supplies One Touch Ultra  Check blood sugars  once daily Dx: E11.9 Patient taking differently: 1 each by Other route See admin instructions. One Touch Ultra  Check blood sugars once daily Dx: E11.9 10/01/21   [name], DO  botulinum toxin Type A (BOTOX) 200 units injection Inject 155 units IM into multiple site in the face,neck and head once every 90 days 06/15/22   Pieter Partridge, DO  buPROPion (WELLBUTRIN XL) 300 MG 24 hr tablet Take 1 tablet (300 mg total) by mouth daily. 02/09/22   [name], DO  diphenhydrAMINE (BENADRYL) 25 MG tablet Take 25 mg by mouth as needed.    [provider]  EMGALITY 120 MG/ML SOAJ INJECT THE CONTENTS OF ONE PEN UNDER THE SKIN EVERY MONTH 04/01/22   [name], DO  EPINEPHrine 0.3 mg/0.3 mL IJ SOAJ injection Inject 0.3 mg into the muscle as needed for anaphylaxis. 11/17/20   [name], DO  ezetimibe (ZETIA) 10 MG tablet TAKE 1 TABLET DAILY 03/29/22   [name], DO  fenofibrate 160 MG tablet Take 1 tablet (160 mg total) by mouth daily. 09/22/21   Revankar, Reita Cliche, MD  fluconazole (DIFLUCAN) 150 MG tablet Take 1 tab, repeat in 72 hours if no improvement. 06/15/22   [name], DO  glucose blood (ONETOUCH VERIO) test strip Use daily to check blood sugar. Patient taking differently: 1 each by Other route as needed for other (see instruction). Use daily to check blood sugar. 10/14/21  [name], DO  isosorbide mononitrate (IMDUR) 30 MG 24 hr tablet TAKE ONE-HALF (1/2) TABLET DAILY 05/25/22   Park Liter, MD  levocetirizine (XYZAL) 5 MG tablet TAKE 1 TABLET EVERY EVENING 01/18/22   Wendling, Crosby Oyster, DO  lisinopril (ZESTRIL) 5 MG tablet Take 1 tablet (5 mg total) by mouth daily. 03/08/22   [name], DO  Lurasidone HCl 120 MG TABS Take 1 tablet (120 mg total) by mouth at bedtime. 11/30/21   [name], DO  metoprolol succinate (TOPROL-XL) 50 MG 24 hr tablet Take 1 tablet (50 mg total) by  mouth daily. Take with or immediately following a meal. 06/23/22   Camnitz, Ocie Doyne, MD  metoprolol tartrate (LOPRESSOR) 25 MG tablet Take 1 tablet (25 mg total) by mouth every 4 (four) hours as needed (PALPITATIONS). 06/08/22   Baldwin Jamaica, PA-C  nitroGLYCERIN (NITROSTAT) 0.4 MG SL tablet Place 1 tablet (0.4 mg total) under the tongue every 5 (five) minutes as needed for chest pain. 11/27/21   Park Liter, MD  ondansetron (ZOFRAN-ODT) 4 MG disintegrating tablet Take 1 tablet (4 mg total) by mouth every 8 (eight) hours as needed for nausea. 05/14/22   [name], DO  pantoprazole (PROTONIX) 40 MG tablet Take 1 tablet (40 mg total) by mouth 2 (two) times daily before a meal. 12/15/21   Wendling, Crosby Oyster, DO  QUEtiapine (SEROQUEL) 300 MG tablet Take 1 tablet (300 mg total) by mouth at bedtime. 11/30/21   [name], DO  topiramate (TOPAMAX) 50 MG tablet TAKE 3 TABLETS TWICE A DAY 12/29/21   Wendling, Crosby Oyster, DO  TRULICITY 1.5 ZL/9.3TT SOPN INJECT 1.5 MG INTO THE SKIN AS DIRECTED ONCE A WEEK 05/25/22   Wendling, Crosby Oyster, DO  VITAMIN D PO Take 1 tablet by mouth daily.    [provider]      Allergies    Bee venom, Onion, Tetanus-diphtheria toxoids td, Sumatriptan, Asa [aspirin], Iodine, Nsaids, Sulfa antibiotics, and Vancomycin    Review of Systems   Review of Systems  Constitutional:  Negative for chills, fatigue and fever.  HENT:  Negative for congestion.   Respiratory:  Positive for chest tightness and shortness of breath. Negative for cough and wheezing.   Cardiovascular:  Positive for chest pain and palpitations. Negative for leg swelling and near-syncope.  Gastrointestinal:  Positive for nausea. Negative for abdominal pain, constipation, diarrhea and vomiting.  Genitourinary:  Negative for dysuria.  Musculoskeletal:  Negative for back pain, neck pain and neck stiffness.  Skin:  Negative for rash.  Neurological:  Positive for  light-headedness. Negative for dizziness, seizures, weakness, numbness and headaches.  Psychiatric/Behavioral:  Negative for agitation and confusion.   All other systems reviewed and are negative.   Physical Exam Updated Vital Signs BP (!) 125/101 (BP Location: Right Arm)   Pulse (!) 139   Temp (!) 97.5 F (36.4 C) (Oral)   Resp (!) 22   Ht _0  (1.676 m)   Wt 97.5 kg   SpO2 100%   BMI 34.70 kg/m  Physical Exam Vitals and nursing note reviewed.  Constitutional:      General: She is not in acute distress.    Appearance: She is well-developed. She is not ill-appearing, toxic-appearing or diaphoretic.  HENT:     Head: Normocephalic and atraumatic.  Eyes:     Conjunctiva/sclera: Conjunctivae normal.  Cardiovascular:     Rate and Rhythm: Regular rhythm. Tachycardia present.  Heart sounds: Normal heart sounds. No murmur heard. Pulmonary:     Effort: Pulmonary effort is normal. No respiratory distress.     Breath sounds: Normal breath sounds. No stridor. No wheezing, rhonchi or rales.  Chest:     Chest wall: No tenderness.  Abdominal:     Palpations: Abdomen is soft.     Tenderness: There is no abdominal tenderness.  Musculoskeletal:        General: No swelling.     Cervical back: Neck supple.     Right lower leg: No tenderness. No edema.     Left lower leg: No tenderness. No edema.  Skin:    General: Skin is warm and dry.     Capillary Refill: Capillary refill takes less than 2 seconds.     Findings: No erythema.  Neurological:     Mental Status: She is alert.  Psychiatric:        Mood and Affect: Mood normal.     ED Results / Procedures / Treatments   Labs (all labs ordered are listed, but only abnormal results are displayed) Labs Reviewed  BASIC METABOLIC PANEL - Abnormal; Notable for the following components:      Result Value   Glucose, Bld 164 (*)    Creatinine, Ser 2.08 (*)    GFR, Estimated 26 (*)    All other components within normal limits  BRAIN  NATRIURETIC PEPTIDE - Abnormal; Notable for the following components:   B Natriuretic Peptide 156.8 (*)    All other components within normal limits  CBC  MAGNESIUM  TSH  TROPONIN I (HIGH SENSITIVITY)  TROPONIN I (HIGH SENSITIVITY)    EKG EKG Interpretation  Date/Time:  Thursday July 08 2022 07:20:42 EDT Ventricular Rate:  132 PR Interval:    QRS Duration: 107 QT Interval:  326 QTC Calculation: 484 R Axis:   -33 Text Interpretation: Junctional tachycardia Incomplete left bundle branch block when comporaed to prior, similar junctional tachycardia to earlier this year. No STEMI Confirmed by Antony Blackbird [phone]) on 07/08/2022 7:45:08 AM  Radiology DG Chest 2 View  Result Date: 07/08/2022 CLINICAL DATA:  Chest pain EXAM: CHEST - 2 VIEW COMPARISON:  11/05/2021 FINDINGS: Normal heart size and mediastinal contours. No acute infiltrate or edema. No effusion or pneumothorax. No acute osseous findings. IMPRESSION: No active cardiopulmonary disease. Electronically Signed   By: Jorje Guild M.D.   On: 07/08/2022 07:51    Procedures .Cardioversion  Date/Time: 07/08/2022 10:56 AM  Performed by: Courtney Paris, MD Authorized by: Courtney Paris, MD   Consent:    Consent obtained:  Verbal   Consent given by:  Patient   Risks discussed:  Induced arrhythmia   Alternatives discussed:  No treatment Universal protocol:    Immediately prior to procedure a time out was called: yes     Patient identity confirmed:  Verbally with patient Pre-procedure details:    Cardioversion basis:  Emergent   Pre-procedure rhythm: junctional tachycardia. Patient sedated: No Attempt one:    Cardioversion mode attempt one: adenosine. Post-procedure details:    Patient status:  Awake   Patient tolerance of procedure:  Tolerated well, no immediate complications Comments:     Patient successfully cardioverted with 6 mg of adenosine on first attempt.       CRITICAL CARE Performed  by: Gwenyth Allegra Starr Engel Total critical care time: 35 minutes Critical care time was exclusive of separately billable procedures and treating other patients. Critical care was necessary to treat or prevent  imminent or life-threatening deterioration. Critical care was time spent personally by me on the following activities: development of treatment plan with patient and/or surrogate as well as nursing, discussions with consultants, evaluation of patient's response to treatment, examination of patient, obtaining history from patient or surrogate, ordering and performing treatments and interventions, ordering and review of laboratory studies, ordering and review of radiographic studies, pulse oximetry and re-evaluation of patient's condition.   Medications Ordered in ED Medications  sodium chloride 0.9 % bolus 500 mL (has no administration in time range)    ED Course/ Medical Decision Making/ A&P                           Medical Decision Making Amount and/or Complexity of Data Reviewed Labs: ordered. Radiology: ordered.  Risk Prescription drug management.    Robertta Halfhill is a 66 y.o. female with a past medical history significant for diabetes, CKD, asthma, hyperlipidemia, atrial fibrillation on Eliquis therapy and status post previous ablation, previous SVT, and recurrent episodes of junctional tachycardia who presents with palpitations and chest pain.  Patient reports that every day for the last few weeks she has been having episodes of palpitations and chest pain.  Normally they only several minutes and then go away but she reports today's episode is lasted for over an hour and a half.  This feels like the time she had to be admitted last time with it.  She says that she took her metoprolol last night as always and has not missed any doses.  She also reports she has been compliant with all of her anticoagulation medication.  She reports that she is having 8 out of 10 crushing central  chest pressure that goes towards her left collarbone with associated nausea, lightheadedness, and shortness of breath.  She denies diaphoresis.  Fevers, chills, congestion, or cough.  Denies any constipation, diarrhea, urinary changes or lower extremity edema.  She reports that Dr. Curt Bears is her electrophysiologist and she is supposed to have an ablation for the arrhythmias in February.  As it has not resolved and she still feeling worse she presents for evaluation.  On initial EKG, patient appears to have a junctional tachycardia that is very regular.  Does not appear to be A-fib or SVT at this time.  Heart rate is between 130s and 140s.  No STEMI seen.  Patient reports that last time she was here she was given adenosine 6 and 12 without any relief and also had to be electrically cardioverted while in the hospital to the care of cardiology.  We will obtain labs and give some fluids however I anticipate once labs are back we will discuss with cardiology appointment.  Given her report that her symptoms have been lasting longer and are happening every day, anticipate discussion with cardiology evaluation significant for further management.  10:04 AM Spoke to the electrophysiology team.  They request attempted adenosine 6 then 12 that does not work, sedate and attempt ED cardioversion.  They also recommended increasing her metoprolol to 50 twice daily and close follow-up if she converts.  If not we will contact them again.  Patient was successfully cardioverted with the into sinus rhythm.  I spoke to cardiology who agrees with plan for discharge and follow-up next week.  We will monitor.   Patient proved stability without returning to the arrhythmia.  She is feeling well.  She passed a p.o. challenge.  We will discharge her with plans to  follow-up with the cardiology team next week and she agrees.  She understands return precautions and follow-up instructions and will double her metoprolol to 50 mg  twice a day instead of nightly.         Final Clinical Impression(s) / ED Diagnoses Final diagnoses:  Precordial pain  Palpitations  Junctional tachycardia    Rx / DC Orders ED Discharge Orders     None      Clinical Impression: 1. Precordial pain   2. Palpitations   3. Junctional tachycardia     Disposition: Discharge  Condition: Good  I have discussed the results, Dx and Tx plan with the pt(& family if present). He/she/they expressed understanding and agree(s) with the plan. Discharge instructions discussed at great length. Strict return precautions discussed and pt &/or family have verbalized understanding of the instructions. No further questions at time of discharge.    New Prescriptions   No medications on file    Follow Up: [facility] [address] [id number] [place]  next week      Shriya Aker, Gwenyth Allegra, MD 07/08/22 [phone]

## 2022-07-09 ENCOUNTER — Encounter: Payer: Self-pay | Admitting: Cardiology

## 2022-07-12 ENCOUNTER — Encounter: Payer: Self-pay | Admitting: Cardiology

## 2022-07-15 ENCOUNTER — Encounter (HOSPITAL_COMMUNITY): Payer: Self-pay | Admitting: Nurse Practitioner

## 2022-07-15 ENCOUNTER — Ambulatory Visit (HOSPITAL_COMMUNITY)
Admit: 2022-07-15 | Discharge: 2022-07-15 | Disposition: A | Discharge status: Home / Self Care | Payer: Commercial Managed Care - HMO | Attending: Nurse Practitioner | Admitting: Nurse Practitioner

## 2022-07-15 VITALS — BP 114/78 | HR 61 | Ht 66.0 in | Wt 219.8 lb

## 2022-07-15 DIAGNOSIS — Z7901 Long term (current) use of anticoagulants: Secondary | ICD-10-CM | POA: Insufficient documentation

## 2022-07-15 DIAGNOSIS — E119 Type 2 diabetes mellitus without complications: Secondary | ICD-10-CM | POA: Diagnosis not present

## 2022-07-15 DIAGNOSIS — E785 Hyperlipidemia, unspecified: Secondary | ICD-10-CM | POA: Insufficient documentation

## 2022-07-15 DIAGNOSIS — E1122 Type 2 diabetes mellitus with diabetic chronic kidney disease: Secondary | ICD-10-CM | POA: Diagnosis not present

## 2022-07-15 DIAGNOSIS — I471 Supraventricular tachycardia, unspecified: Secondary | ICD-10-CM | POA: Insufficient documentation

## 2022-07-15 DIAGNOSIS — I480 Paroxysmal atrial fibrillation: Secondary | ICD-10-CM | POA: Diagnosis present

## 2022-07-15 DIAGNOSIS — I129 Hypertensive chronic kidney disease with stage 1 through stage 4 chronic kidney disease, or unspecified chronic kidney disease: Secondary | ICD-10-CM | POA: Diagnosis not present

## 2022-07-15 DIAGNOSIS — R001 Bradycardia, unspecified: Secondary | ICD-10-CM | POA: Insufficient documentation

## 2022-07-15 DIAGNOSIS — I440 Atrioventricular block, first degree: Secondary | ICD-10-CM | POA: Diagnosis not present

## 2022-07-15 DIAGNOSIS — N183 Chronic kidney disease, stage 3 unspecified: Secondary | ICD-10-CM | POA: Diagnosis not present

## 2022-07-15 MED ORDER — DILTIAZEM HCL 30 MG PO TABS: ORAL_TABLET | ORAL | 1 refills | Status: DC

## 2022-07-15 NOTE — Addendum Note (Signed)
Encounter addended by: Juluis Mire, RN on: 07/15/2022 2:29 PM  Actions taken: Alternative orders not taken and original order placed, Pharmacy for encounter modified, Order list changed

## 2022-07-15 NOTE — Addendum Note (Signed)
Encounter addended by: Sherran Needs, NP on: 07/15/2022 2:50 PM  Actions taken: Clinical Note Signed

## 2022-07-15 NOTE — Progress Notes (Addendum)
Primary Care Physician: [name], DO Referring Physician: Dr. Altha Harm [place] is a 66 y.o. female with a h/o of DM, HLD, CKD (III), SVT, AFib (has hx of a CTI and PVI ablation Jan 2020 in [place]), obesity, Bipolar d/o, migraine HAs, HTN. June 2021 went to [facility] with palpitations and chest pain.  She was found to be in sinus rhythm on presentation.  Catheterization showed no evidence of coronary artery disease. Long standing h/o of atypical chest pain and shortness of breath.  She was seen in the ER in [place] in March and had a cardioversion for SVT.   She saw [name] in April and c/o palpitations. A monitor was in place. [name] stated in her note if monitor showed SVT and no afib then Dr. Curt Bears would consider ablation.  It  did not show  any  afib, only very short runs of SVT.   She wanted to  discuses monitor further,  referred by Dr. Curt Bears office after she requested an appointment with him to discuss. She had more rapid heart rate July 15th, started to the ER but it did slow and return to SR so she turned around. In SR she is very slow in the 40's with a first degree block so unable to uptitrate her BB. She is on flecainide and it seems to be keeping afib in check.   F/u Afib clinic, 07/15/22. She is now in the afib clinic for f/u of ED visit for tachycardia with associated chest pain. EKG suggested junctional tachycardia at 135 bpm. She converted with adenosine. She is in SR today. She has short runs of this frequently maybe 2-3 times at home. She saw Dr. Curt Bears 11/3 and she is pending an ablation 10/27/22. She is leaving in a week to go to [place] for the holidays and will return 09/09/22. She reports that she was in [place] in the recent past and went to ED for tachycardia, she did not convert with 2 doses of adenosine and had to be cardioverted. Her flecainide  was topped at that time.  Her lopressor was doubled in ED now on Metoprolol Succinate 50 mg BID from  daily.     Today, she denies symptoms of palpitations, chest pain, shortness of breath, orthopnea, PND, lower extremity edema, dizziness, presyncope, syncope, or neurologic sequela. The patient is tolerating medications without difficulties and is otherwise without complaint today.   Past Medical History:  Diagnosis Date   A-fib [facility])    ACS (acute coronary syndrome) ([place]) 07/31/2021   Acute cystitis without hematuria 05/14/2017   Last Assessment & Plan:  Formatting of this note might be different from the original. - suprapubic tenderness with symptoms of urinary frequency and urgency and subjective fevers (no fevers recorded on any visit). Recently passed a renal stone, US shows left hydronephrosis which is improving.  - UA negative8/24 but pt is clinically symptomatic - will repeat UA  - start pt on Microbid for 5 days   Adhesive capsulitis of left shoulder 12/16/2016   Anxiety    Asthma without status asthmaticus 02/06/2021   At risk for falls 02/04/2019   Atrial fibrillation ([place]) 09/01/2014   Last Assessment & Plan:  Formatting of this note might be different from the original. A:  Chronic.  Sinus rhythm at this time.  States she was told this during admission at [facility] in the past. P:  On baby aspirin at home will continue.  Low dose metoprolol started.  Bipolar 1 disorder, depressed, moderate ([place]) 02/18/2020   Capsulitis of left shoulder 10/20/2021   Chronic anticoagulation 02/20/2020   Chronic atrial fibrillation ([place]) 02/18/2020   Chronic headache 10/11/2013   Chronic interstitial cystitis 12/28/2005   Chronic migraine without aura, intractable, without status migrainosus 06/24/2017   Colon cancer screening 04/15/2016   Last Assessment & Plan:  Formatting of this note might be different from the original. Will schedule for colonoscopy   Depression, recurrent ([place]) 02/18/2020   Diabetes mellitus ([place])    Dyslipidemia 79/10/4095   Dysmetabolic syndrome X 35/32/9924   GAD  (generalized anxiety disorder) 02/18/2020   GERD (gastroesophageal reflux disease) 11/05/2021   Heartburn 04/15/2016   Last Assessment & Plan:  Formatting of this note might be different from the original. Patient was counseled regarding lifestyle modification  Advised to take Prilosec 30 mins before breakfast  Will schedule for EGD   History of atrial fibrillation 06/24/2017   History of posttraumatic stress disorder (PTSD) 10/07/2016   HLD (hyperlipidemia) 09/01/2014   Last Assessment & Plan:  Formatting of this note might be different from the original. A: Chronic. P: Continue statin.   Hyperosmolarity due to secondary diabetes mellitus ([place]) 02/06/2021   Hypertriglyceridemia 02/18/2020   Impaired mobility and activities of daily living 11/14/2015   Irritable bowel syndrome (IBS)    Junctional tachycardia 11/05/2021   Kidney stone 02/06/2021   Migraine without aura, not refractory 02/06/2021   Mixed hyperlipidemia 04/06/2021   Last Assessment & Plan:  Formatting of this note might be different from the original. Due for labs with PCP in November   Morbid obesity ([place]) 12/16/2016   Paroxysmal supraventricular tachycardia 11/05/2021   Pneumonia, organism unspecified(486) 11/21/2002   Polyneuropathy due to type 2 diabetes mellitus ([place]) 02/06/2021   Primary hypertension 04/06/2021   Last Assessment & Plan:  Formatting of this note might be different from the original. Controlled   Refractory migraine with aura 02/06/2021   Refractory migraine without aura 26/83/4196   Renal colic on left side 22/29/7989   Stage 3 chronic kidney disease ([place]) 02/20/2020   Unspecified adverse effect of other drug, medicinal and biological substance(995.29) 02/17/2006   Urinary tract obstruction 02/06/2021   Past Surgical History:  Procedure Laterality Date   ABDOMINAL HYSTERECTOMY     Fibroids   ATRIAL FIBRILLATION ABLATION     CARDIAC CATHETERIZATION     CARDIOVERSION     CESAREAN SECTION      KNEE SURGERY     LEFT HEART CATH AND CORONARY ANGIOGRAPHY N/A 03/04/2020   Procedure: LEFT HEART CATH AND CORONARY ANGIOGRAPHY;  Surgeon: Belva Crome, MD;  Location: [facility];  Service: Cardiovascular;  Laterality: N/A;   ROTATOR CUFF REPAIR      Current Outpatient Medications  Medication Sig Dispense Refill   acetaminophen (TYLENOL) 500 MG tablet Take 1,000 mg by mouth every 6 (six) hours as needed for mild pain.     albuterol (VENTOLIN HFA) 108 (90 Base) MCG/ACT inhaler Inhale 2 puffs into the lungs every 4 (four) hours as needed for wheezing. 1 each 2   apixaban (ELIQUIS) 5 MG TABS tablet Take 1 tablet (5 mg total) by mouth 2 (two) times daily. 180 tablet 1   atorvastatin (LIPITOR) 80 MG tablet Take 1 tablet (80 mg total) by mouth daily. 90 tablet 3   B Complex Vitamins (B COMPLEX PO) Take 1 tablet by mouth daily.     blood glucose meter kit and supplies One Touch Ultra  Check 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 One Touch Ultra  Check blood sugars once daily Dx: E11.9) 1 each 0   botulinum toxin Type A (BOTOX) 200 units injection Inject 155 units IM into multiple site in the face,neck and head once every 90 days 1 each 4   buPROPion (WELLBUTRIN XL) 300 MG 24 hr tablet Take 1 tablet (300 mg total) by mouth daily. 90 tablet 2   diphenhydrAMINE (BENADRYL) 25 MG tablet Take 25 mg by mouth as needed.     EMGALITY 120 MG/ML SOAJ INJECT THE CONTENTS OF ONE PEN UNDER THE SKIN EVERY MONTH 1 mL 2   EPINEPHrine 0.3 mg/0.3 mL IJ SOAJ injection Inject 0.3 mg into the muscle as needed for anaphylaxis. 1 each 1   ezetimibe (ZETIA) 10 MG tablet TAKE 1 TABLET DAILY 90 tablet 3   fenofibrate 160 MG tablet Take 1 tablet (160 mg total) by mouth daily. 90 tablet 3   glucose blood (ONETOUCH VERIO) test strip Use daily to check blood sugar. (Patient taking differently: 1 each by Other route as needed for other (see instruction).  Use daily to check blood sugar.) 100 each 3   isosorbide mononitrate (IMDUR) 30 MG 24 hr tablet TAKE ONE-HALF (1/2) TABLET DAILY 45 tablet 3   levocetirizine (XYZAL) 5 MG tablet TAKE 1 TABLET EVERY EVENING 90 tablet 3   lisinopril (ZESTRIL) 5 MG tablet Take 1 tablet (5 mg total) by mouth daily. 90 tablet 3   Lurasidone HCl 120 MG TABS Take 1 tablet (120 mg total) by mouth at bedtime. 90 tablet 2   metoprolol succinate (TOPROL-XL) 50 MG 24 hr tablet Take 1 tablet (50 mg total) by mouth daily. Take with or immediately following a meal. (Patient taking differently: Take 50 mg by mouth 2 (two) times daily. Take with or immediately following a meal.) 90 tablet 3   metoprolol tartrate (LOPRESSOR) 25 MG tablet Take 1 tablet (25 mg total) by mouth every 4 (four) hours as needed (PALPITATIONS). 90 tablet 1   nitroGLYCERIN (NITROSTAT) 0.4 MG SL tablet Place 1 tablet (0.4 mg total) under the tongue every 5 (five) minutes as needed for chest pain. 25 tablet 11   ondansetron (ZOFRAN-ODT) 4 MG disintegrating tablet Take 1 tablet (4 mg total) by mouth every 8 (eight) hours as needed for nausea. 20 tablet 2   pantoprazole (PROTONIX) 40 MG tablet Take 1 tablet (40 mg total) by mouth 2 (two) times daily before a meal. 180 tablet 1   QUEtiapine (SEROQUEL) 300 MG tablet Take 1 tablet (300 mg total) by mouth at bedtime. 90 tablet 2   topiramate (TOPAMAX) 50 MG tablet TAKE 3 TABLETS TWICE A DAY 025 tablet 11   TRULICITY 1.5 EN/2.7PO SOPN INJECT 1.5 MG INTO THE SKIN AS DIRECTED ONCE A WEEK 6 mL 3   VITAMIN D PO Take 1 tablet by mouth daily.     No current facility-administered medications for this encounter.    Allergies  Allergen Reactions   Bee Venom Anaphylaxis   Onion Other (See Comments)    Respiratory Distress   Tetanus-Diphtheria Toxoids Td Anaphylaxis   Sumatriptan     Passed out, nose bleed    Asa [Aspirin] Nausea And Vomiting   Iodine Rash   Nsaids Nausea Only    Rash, hives and trouble breathing    Sulfa Antibiotics Rash   Vancomycin Rash    Social History   Socioeconomic History  Marital status: Widowed    Spouse name: Not on file   Number of children: 6   Years of education: Not on file   Highest education level: Not on file  Occupational History   Not on file  Tobacco Use   Smoking status: Never   Smokeless tobacco: Never   Tobacco comments:    Never smoke 07/15/22  Vaping Use   Vaping Use: Never used  Substance and Sexual Activity   Alcohol use: Never   Drug use: Never   Sexual activity: Not on file  Other Topics Concern   Not on file  Social History Narrative   Not on file   Social Determinants of Health   Financial Resource Strain: Not on file  Food Insecurity: Not on file  Transportation Needs: Not on file  Physical Activity: Not on file  Stress: Not on file  Social Connections: Not on file  Intimate Partner Violence: Not on file    Family History  Adopted: Yes  Problem Relation Age of Onset   Migraines Daughter    Bipolar disorder Daughter    Migraines Daughter    Migraines Son    Bipolar disorder Son     ROS- All systems are reviewed and negative except as per the HPI above  Physical Exam: Vitals:   07/15/22 0844  BP: 114/78  Pulse: 61  Weight: 99.7 kg  Height: _0  (1.676 m)   Wt Readings from Last 3 Encounters:  07/15/22 99.7 kg  07/08/22 97.5 kg  06/18/22 98.4 kg    Labs: Lab Results  Component Value Date   NA 142 07/08/2022   K 4.2 07/08/2022   CL 111 07/08/2022   CO2 24 07/08/2022   GLUCOSE 164 (H) 07/08/2022   BUN 23 07/08/2022   CREATININE 2.08 (H) 07/08/2022   CALCIUM 9.4 07/08/2022   MG 1.7 07/08/2022   No results found for: "INR" Lab Results  Component Value Date   CHOL 123 12/15/2021   HDL 23.30 (L) 12/15/2021   LDLCALC 66 12/15/2021   TRIG 169.0 (H) 12/15/2021     GEN- The patient is well appearing, alert and oriented x 3 today.   Head- normocephalic, atraumatic Eyes-  Sclera clear, conjunctiva  pink Ears- hearing intact Oropharynx- clear Neck- supple, no JVP Lymph- no cervical lymphadenopathy Lungs- Clear to ausculation bilaterally, normal work of breathing Heart- Slow regular rate and rhythm, no murmurs, rubs or gallops, PMI not laterally displaced GI- soft, NT, ND, + BS Extremities- no clubbing, cyanosis, or edema MS- no significant deformity or atrophy Skin- no rash or lesion Psych- euthymic mood, full affect Neuro- strength and sensation are intact  EKG- Vent. rate 61 BPM PR interval 160 ms QRS duration 90 ms QT/QTcB 386/388 ms P-R-T axes 69 -11 23 Normal sinus rhythm Nonspecific T wave abnormality Abnormal ECG When compared with ECG of 08-Jul-2022 10:44, PREVIOUS ECG IS PRESENT  EKG from ED visit- Vent. rate 135 BPM PR interval * ms QRS duration 105 ms QT/QTcB 316/474 ms P-R-T axes * -34 175 Junctional tachycardia Left axis deviation Nonspecific repol abnormality, lateral leads Confirmed by Lacretia Leigh ([id number]) on 07/08/2022 11:13:25 AM  Monitor- Patch Wear Time:  14 days and 0 hours (2023-05-09T11:43:20-0400 to 2023-05-23T11:43:24-0400)   Patient had a min HR of 39 bpm, max HR of 120 bpm, and avg HR of 62 bpm. Predominant underlying rhythm was Sinus Rhythm. 2 Supraventricular Tachycardia runs occurred, the run with the fastest interval lasting 7 beats with a max rate  of 120 bpm, the  longest lasting 8 beats with an avg rate of 97 bpm. Isolated SVEs were rare (<1.0%), SVE Couplets were rare (<1.0%), and SVE Triplets were rare (<1.0%). Isolated VEs were rare (<1.0%), and no VE Couplets or VE Triplets were present. Ventricular Trigeminy  was present.    Summary and conclusions:  Short episodes of SVT, rest normal, no need to treat  Assessment and Plan:  1. Afib/SVT Afib seems to be quiet board but still having episodes of SVT which are symptomatic for the pt She is scheduled for ablation in late February but having issues with tachycardia at least weekly  with occasional faster episodes associated with chest pain for which she goes ot the ED She is off flecainide  Metoprolol succinate was increased to 50 mg bid.form daily  Continue eliquis 5 mg bid for a CHA2DS2VASc of least 3 ( DM, female, will be 66 yo  09/20/22)  2. H/o  Bradycardia/ first degree av block previous on flecainide/tachy/brady syndrome/SVT  Ep study is pending in February   I will discuss with Dr. Curt Bears any AAD option to  start in the next week with pt leaving for time with family in [place]  and not returning unitl 09/09/22, to keep arrhythmia quiet until ablation   07/15/22- I discussed with Dr. Curt Bears and will start Multaq 400 mg bid with food and reducing metoprolol back to 50 mg daily. She will come in next week with EKG on drug for  prior to going out of [place]. I will also call in 30 mg cardize  to use as needed.  07/15/22- Addendum #2 Pt is on Seroquel which is a hard stop to add Multaq, which means she would not be able to take amiodarone or tikosyn as well.. She then will stay on metoprolol succinate 50 mg bid and  30 mg Cardizem to use as needed.   Geroge Baseman Yuya Vanwingerden, [facility] [address] [place], [place] [address] [phone]

## 2022-07-15 NOTE — Addendum Note (Signed)
Encounter addended by: Sherran Needs, NP on: 07/15/2022 1:35 PM  Actions taken: Clinical Note Signed

## 2022-07-21 ENCOUNTER — Other Ambulatory Visit: Payer: Self-pay | Admitting: Family Medicine

## 2022-07-21 ENCOUNTER — Ambulatory Visit (INDEPENDENT_AMBULATORY_CARE_PROVIDER_SITE_OTHER): Payer: Commercial Managed Care - HMO | Admitting: Family Medicine

## 2022-07-21 ENCOUNTER — Ambulatory Visit: Payer: Commercial Managed Care - HMO | Admitting: Cardiology

## 2022-07-21 ENCOUNTER — Encounter: Payer: Self-pay | Admitting: Family Medicine

## 2022-07-21 VITALS — BP 120/76 | HR 65 | Temp 98.0°F | Ht 66.0 in | Wt 219.5 lb

## 2022-07-21 DIAGNOSIS — R799 Abnormal finding of blood chemistry, unspecified: Secondary | ICD-10-CM

## 2022-07-21 DIAGNOSIS — Z23 Encounter for immunization: Secondary | ICD-10-CM

## 2022-07-21 DIAGNOSIS — F319 Bipolar disorder, unspecified: Secondary | ICD-10-CM | POA: Diagnosis not present

## 2022-07-21 DIAGNOSIS — E1165 Type 2 diabetes mellitus with hyperglycemia: Secondary | ICD-10-CM | POA: Diagnosis not present

## 2022-07-21 DIAGNOSIS — Z0000 Encounter for general adult medical examination without abnormal findings: Secondary | ICD-10-CM

## 2022-07-21 DIAGNOSIS — N184 Chronic kidney disease, stage 4 (severe): Secondary | ICD-10-CM

## 2022-07-21 LAB — COMPREHENSIVE METABOLIC PANEL
ALT: 13 U/L (ref 0–35)
AST: 17 U/L (ref 0–37)
Albumin: 3.7 g/dL (ref 3.5–5.2)
Alkaline Phosphatase: 64 U/L (ref 39–117)
BUN: 20 mg/dL (ref 6–23)
CO2: 26 mEq/L (ref 19–32)
Calcium: 9.4 mg/dL (ref 8.4–10.5)
Chloride: 110 mEq/L (ref 96–112)
Creatinine, Ser: 1.92 mg/dL — ABNORMAL HIGH (ref 0.40–1.20)
GFR: 27.15 mL/min — ABNORMAL LOW (ref >60.00)
Glucose, Bld: 114 mg/dL — ABNORMAL HIGH (ref 70–99)
Potassium: 4.5 mEq/L (ref 3.5–5.1)
Sodium: 141 mEq/L (ref 135–145)
Total Bilirubin: 0.3 mg/dL (ref 0.2–1.2)
Total Protein: 6.6 g/dL (ref 6.0–8.3)

## 2022-07-21 LAB — LIPID PANEL
Cholesterol: 116 mg/dL (ref 0–200)
HDL: 27.9 mg/dL — ABNORMAL LOW (ref >39.00)
LDL Cholesterol: 63 mg/dL (ref 0–99)
NonHDL: 88.3
Total CHOL/HDL Ratio: 4
Triglycerides: 126 mg/dL (ref 0.0–149.0)
VLDL: 25.2 mg/dL (ref 0.0–40.0)

## 2022-07-21 LAB — CBC
HCT: 35.5 % — ABNORMAL LOW (ref 36.0–46.0)
Hemoglobin: 11.8 g/dL — ABNORMAL LOW (ref 12.0–15.0)
MCHC: 33.2 g/dL (ref 30.0–36.0)
MCV: 94.6 fl (ref 78.0–100.0)
Platelets: 341 10*3/uL (ref 150.0–400.0)
RBC: 3.76 Mil/uL — ABNORMAL LOW (ref 3.87–5.11)
RDW: 14.7 % (ref 11.5–15.5)
WBC: 6.6 10*3/uL (ref 4.0–10.5)

## 2022-07-21 LAB — HEMOGLOBIN A1C: Hgb A1c MFr Bld: 6.6 % — ABNORMAL HIGH (ref 4.6–6.5)

## 2022-07-21 MED ORDER — TRULICITY 1.5 MG/0.5ML ~~LOC~~ SOAJ: 1.5000 mg | SUBCUTANEOUS | 3 refills | Status: DC

## 2022-07-21 MED ORDER — LURASIDONE HCL 120 MG PO TABS: 120.0000 mg | ORAL_TABLET | Freq: Every evening | ORAL | 2 refills | Status: DC

## 2022-07-21 MED ORDER — QUETIAPINE FUMARATE 300 MG PO TABS: 300.0000 mg | ORAL_TABLET | Freq: Every day | ORAL | 2 refills | Status: DC

## 2022-07-21 NOTE — Patient Instructions (Addendum)
Give [place] 2-3 business days to get the results of your labs back.   Keep the diet clean and stay active.  Please get me a copy of your advanced directive form at your convenience.   If you do not hear anything about your referral in the next 1-2 weeks, call our office and ask for an update.  The Shingrix vaccine (for shingles) is a 2 shot series spaced 2-6 months apart. It can make people feel low energy, achy and almost like they have the flu for 48 hours after injection. 1/5 people can have nausea and/or vomiting. Please plan accordingly when deciding on when to get this shot. Call our office for a nurse visit appointment to get this. The second shot of the series is less severe regarding the side effects, but it still lasts 48 hours.   If interested in having the skin tag on your back removed, check with your insurance company to ensure they cover the procedure to remove irritated/painful skin tags.   Let us know if you need anything.

## 2022-07-21 NOTE — Progress Notes (Signed)
Chief Complaint  Patient presents with   Annual Exam     Well Woman Paula Stout is here for a complete physical.   Her last physical was >1 year ago.  Current diet: in general, a "pretty good" diet. Current exercise: none. Weight is stable and she denies fatigue out of ordinary. Seatbelt? Yes Advanced directive? No  Health Maintenance Pap/HPV- N/A Mammogram- Yes Colon cancer screening-Yes Shingrix- No Tetanus- Yes Hep C screening- Yes HIV screening- Yes  Past Medical History:  Diagnosis Date   A-fib ([place])    ACS (acute coronary syndrome) ([place]) 07/31/2021   Acute cystitis without hematuria 05/14/2017   Last Assessment & Plan:  Formatting of this note might be different from the original. - suprapubic tenderness with symptoms of urinary frequency and urgency and subjective fevers (no fevers recorded on any visit). Recently passed a renal stone, US shows left hydronephrosis which is improving.  - UA negative8/24 but pt is clinically symptomatic - will repeat UA  - start pt on Microbid for 5 days   Adhesive capsulitis of left shoulder 12/16/2016   Anxiety    Asthma without status asthmaticus 02/06/2021   At risk for falls 02/04/2019   Atrial fibrillation ([place]) 09/01/2014   Last Assessment & Plan:  Formatting of this note might be different from the original. A:  Chronic.  Sinus rhythm at this time.  States she was told this during admission at [facility] in the past. P:  On baby aspirin at home will continue.  Low dose metoprolol started.   Bipolar 1 disorder, depressed, moderate ([place]) 02/18/2020   Capsulitis of left shoulder 10/20/2021   Chronic anticoagulation 02/20/2020   Chronic atrial fibrillation ([place]) 02/18/2020   Chronic headache 10/11/2013   Chronic interstitial cystitis 12/28/2005   Chronic migraine without aura, intractable, without status migrainosus 06/24/2017   Colon cancer screening 04/15/2016   Last Assessment & Plan:  Formatting of this note might be  different from the original. Will schedule for colonoscopy   Depression, recurrent ([place]) 02/18/2020   Diabetes mellitus ([place])    Dyslipidemia 32/35/5732   Dysmetabolic syndrome X 20/25/4270   GAD (generalized anxiety disorder) 02/18/2020   GERD (gastroesophageal reflux disease) 11/05/2021   Heartburn 04/15/2016   Last Assessment & Plan:  Formatting of this note might be different from the original. Patient was counseled regarding lifestyle modification  Advised to take Prilosec 30 mins before breakfast  Will schedule for EGD   History of atrial fibrillation 06/24/2017   History of posttraumatic stress disorder (PTSD) 10/07/2016   HLD (hyperlipidemia) 09/01/2014   Last Assessment & Plan:  Formatting of this note might be different from the original. A: Chronic. P: Continue statin.   Hyperosmolarity due to secondary diabetes mellitus ([place]) 02/06/2021   Hypertriglyceridemia 02/18/2020   Impaired mobility and activities of daily living 11/14/2015   Irritable bowel syndrome (IBS)    Junctional tachycardia 11/05/2021   Kidney stone 02/06/2021   Migraine without aura, not refractory 02/06/2021   Mixed hyperlipidemia 04/06/2021   Last Assessment & Plan:  Formatting of this note might be different from the original. Due for labs with PCP in November   Morbid obesity ([place]) 12/16/2016   Paroxysmal supraventricular tachycardia 11/05/2021   Pneumonia, organism unspecified(486) 11/21/2002   Polyneuropathy due to type 2 diabetes mellitus ([place]) 02/06/2021   Primary hypertension 04/06/2021   Last Assessment & Plan:  Formatting of this note might be different from the original. Controlled   Refractory migraine with aura 02/06/2021  Refractory migraine without aura 97/35/3299   Renal colic on left side 24/26/8341   Stage 3 chronic kidney disease ([place]) 02/20/2020   Unspecified adverse effect of other drug, medicinal and biological substance(995.29) 02/17/2006   Urinary tract obstruction 02/06/2021      Past Surgical History:  Procedure Laterality Date   ABDOMINAL HYSTERECTOMY     Fibroids   ATRIAL FIBRILLATION ABLATION     CARDIAC CATHETERIZATION     CARDIOVERSION     CESAREAN SECTION     KNEE SURGERY     LEFT HEART CATH AND CORONARY ANGIOGRAPHY N/A 03/04/2020   Procedure: LEFT HEART CATH AND CORONARY ANGIOGRAPHY;  Surgeon: Belva Crome, MD;  Location: [facility];  Service: Cardiovascular;  Laterality: N/A;   ROTATOR CUFF REPAIR      Medications  Current Outpatient Medications on File Prior to Visit  Medication Sig Dispense Refill   acetaminophen (TYLENOL) 500 MG tablet Take 1,000 mg by mouth every 6 (six) hours as needed for mild pain.     albuterol (VENTOLIN HFA) 108 (90 Base) MCG/ACT inhaler Inhale 2 puffs into the lungs every 4 (four) hours as needed for wheezing. 1 each 2   apixaban (ELIQUIS) 5 MG TABS tablet Take 1 tablet (5 mg total) by mouth 2 (two) times daily. 180 tablet 1   atorvastatin (LIPITOR) 80 MG tablet Take 1 tablet (80 mg total) by mouth daily. 90 tablet 3   B Complex Vitamins (B COMPLEX PO) Take 1 tablet by mouth daily.     blood glucose meter kit and supplies One Touch Ultra  Check 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 One Touch Ultra  Check blood sugars once daily Dx: E11.9) 1 each 0   botulinum toxin Type A (BOTOX) 200 units injection Inject 155 units IM into multiple site in the face,neck and head once every 90 days 1 each 4   buPROPion (WELLBUTRIN XL) 300 MG 24 hr tablet Take 1 tablet (300 mg total) by mouth daily. 90 tablet 2   diltiazem (CARDIZEM) 30 MG tablet Take 1 tablet every 4 hours AS NEEDED for AFIB heart rate >100 as long as top BP >100. 30 tablet 1   diphenhydrAMINE (BENADRYL) 25 MG tablet Take 25 mg by mouth as needed.     EMGALITY 120 MG/ML SOAJ INJECT THE CONTENTS OF ONE PEN UNDER THE SKIN EVERY MONTH 1 mL 2   EPINEPHrine 0.3 mg/0.3 mL IJ SOAJ injection Inject 0.3  mg into the muscle as needed for anaphylaxis. 1 each 1   ezetimibe (ZETIA) 10 MG tablet TAKE 1 TABLET DAILY 90 tablet 3   fenofibrate 160 MG tablet Take 1 tablet (160 mg total) by mouth daily. 90 tablet 3   glucose blood (ONETOUCH VERIO) test strip Use daily to check blood sugar. (Patient taking differently: 1 each by Other route as needed for other (see instruction). Use daily to check blood sugar.) 100 each 3   isosorbide mononitrate (IMDUR) 30 MG 24 hr tablet TAKE ONE-HALF (1/2) TABLET DAILY 45 tablet 3   levocetirizine (XYZAL) 5 MG tablet TAKE 1 TABLET EVERY EVENING 90 tablet 3   lisinopril (ZESTRIL) 5 MG tablet Take 1 tablet (5 mg total) by mouth daily. 90 tablet 3   Lurasidone HCl 120 MG TABS Take 1 tablet (120 mg total) by mouth at bedtime. 90 tablet 2   metoprolol succinate (TOPROL-XL) 50 MG 24 hr tablet Take 1 tablet (50  mg total) by mouth daily. Take with or immediately following a meal. (Patient taking differently: Take 50 mg by mouth 2 (two) times daily. Take with or immediately following a meal.) 90 tablet 3   metoprolol tartrate (LOPRESSOR) 25 MG tablet Take 1 tablet (25 mg total) by mouth every 4 (four) hours as needed (PALPITATIONS). 90 tablet 1   nitroGLYCERIN (NITROSTAT) 0.4 MG SL tablet Place 1 tablet (0.4 mg total) under the tongue every 5 (five) minutes as needed for chest pain. 25 tablet 11   ondansetron (ZOFRAN-ODT) 4 MG disintegrating tablet Take 1 tablet (4 mg total) by mouth every 8 (eight) hours as needed for nausea. 20 tablet 2   pantoprazole (PROTONIX) 40 MG tablet Take 1 tablet (40 mg total) by mouth 2 (two) times daily before a meal. 180 tablet 1   QUEtiapine (SEROQUEL) 300 MG tablet Take 1 tablet (300 mg total) by mouth at bedtime. 90 tablet 2   topiramate (TOPAMAX) 50 MG tablet TAKE 3 TABLETS TWICE A DAY 588 tablet 11   TRULICITY 1.5 TG/5.4DI SOPN INJECT 1.5 MG INTO THE SKIN AS DIRECTED ONCE A WEEK 6 mL 3   VITAMIN D PO Take 1 tablet by mouth daily.      Allergies Allergies  Allergen Reactions   Bee Venom Anaphylaxis   Onion Other (See Comments)    Respiratory Distress   Tetanus-Diphtheria Toxoids Td Anaphylaxis   Sumatriptan     Passed out, nose bleed    Asa [Aspirin] Nausea And Vomiting   Iodine Rash   Nsaids Nausea Only    Rash, hives and trouble breathing   Sulfa Antibiotics Rash   Vancomycin Rash    Review of Systems: Constitutional:  no unexpected weight changes Eye:  no recent significant change in vision Ear/Nose/Mouth/Throat:  Ears:  no recent change in hearing Nose/Mouth/Throat:  no complaints of nasal congestion, no sore throat Cardiovascular: no chest pain Respiratory:  no new shortness of breath Gastrointestinal:  no abdominal pain, no change in bowel habits GU:  Female: negative for dysuria or pelvic pain Musculoskeletal/Extremities:  no pain of the joints Integumentary (Skin/Breast):  no abnormal skin lesions reported Neurologic:  no headaches Endocrine:  denies fatigue  Exam BP 120/76 (BP Location: Left Arm, Patient Position: Sitting, Cuff Size: Normal)   Pulse 65   Temp 98 F (36.7 C) (Oral)   Ht _0  (1.676 m)   Wt 219 lb 8 oz (99.6 kg)   SpO2 98%   BMI 35.43 kg/m  General:  well developed, well nourished, in no apparent distress Skin:  no significant moles, warts, or growths Head:  no masses, lesions, or tenderness Eyes:  pupils equal and round, sclera anicteric without injection Ears:  canals without lesions, TMs shiny without retraction, no obvious effusion, no erythema Nose:  nares patent, mucosa normal, and no drainage  Throat/Pharynx:  lips and gingiva without lesion; tongue and uvula midline; non-inflamed pharynx; no exudates or postnasal drainage Neck: neck supple without adenopathy, thyromegaly, or masses Lungs:  clear to auscultation, breath sounds equal bilaterally, no respiratory distress Cardio:  regular rate and rhythm, no LE edema Abdomen:  abdomen soft, nontender; bowel sounds  normal; no masses or organomegaly Genital: Defer to GYN Musculoskeletal:  symmetrical muscle groups noted without atrophy or deformity Extremities:  no clubbing, cyanosis, or edema, no deformities, no skin discoloration Neuro:  gait normal; deep tendon reflexes normal and symmetric Psych: well oriented with normal range of affect and appropriate judgment/insight  Assessment and Plan  Well adult exam - Plan: CBC, Comprehensive metabolic panel, Lipid panel  Type 2 diabetes mellitus with hyperglycemia, without long-term current use of insulin (HCC) - Plan: Hemoglobin A1c, Ambulatory referral to Ophthalmology  Bipolar 1 disorder ([place]) - Plan: QUEtiapine (SEROQUEL) 300 MG tablet   Well 66 y.o. female. Counseled on diet and exercise. Advanced directive form provided today.  Flu shot today.  Other orders as above. Follow up in 6 mo or prn. The patient voiced understanding and agreement to the plan.  [place], DO 07/21/22 10:51 AM

## 2022-07-22 ENCOUNTER — Other Ambulatory Visit: Payer: Commercial Managed Care - HMO

## 2022-07-22 ENCOUNTER — Encounter (HOSPITAL_COMMUNITY): Payer: Commercial Managed Care - HMO | Admitting: Nurse Practitioner

## 2022-07-22 DIAGNOSIS — R799 Abnormal finding of blood chemistry, unspecified: Secondary | ICD-10-CM

## 2022-07-22 LAB — IRON,TIBC AND FERRITIN PANEL
%SAT: 13 % (calc) — ABNORMAL LOW (ref 16–45)
Ferritin: 36 ng/mL (ref 16–288)
Iron: 57 ug/dL (ref 45–160)
TIBC: 439 mcg/dL (calc) (ref 250–450)

## 2022-08-02 ENCOUNTER — Other Ambulatory Visit: Payer: Self-pay | Admitting: Family Medicine

## 2022-08-02 DIAGNOSIS — F319 Bipolar disorder, unspecified: Secondary | ICD-10-CM

## 2022-08-03 ENCOUNTER — Encounter: Payer: Self-pay | Admitting: Family Medicine

## 2022-08-03 DIAGNOSIS — F319 Bipolar disorder, unspecified: Secondary | ICD-10-CM

## 2022-08-03 MED ORDER — QUETIAPINE FUMARATE 300 MG PO TABS: 300.0000 mg | ORAL_TABLET | Freq: Every day | ORAL | 3 refills | Status: DC

## 2022-08-03 MED ORDER — LURASIDONE HCL 120 MG PO TABS: 1.0000 | ORAL_TABLET | Freq: Every day | ORAL | 3 refills | Status: DC

## 2022-08-24 ENCOUNTER — Other Ambulatory Visit: Payer: Self-pay | Admitting: Family Medicine

## 2022-08-24 ENCOUNTER — Other Ambulatory Visit: Payer: Self-pay

## 2022-08-24 DIAGNOSIS — K219 Gastro-esophageal reflux disease without esophagitis: Secondary | ICD-10-CM

## 2022-08-24 MED ORDER — METOPROLOL TARTRATE 25 MG PO TABS: 25.0000 mg | ORAL_TABLET | ORAL | 3 refills | Status: DC | PRN

## 2022-09-13 ENCOUNTER — Ambulatory Visit (INDEPENDENT_AMBULATORY_CARE_PROVIDER_SITE_OTHER): Payer: Commercial Managed Care - HMO | Admitting: Family Medicine

## 2022-09-13 ENCOUNTER — Encounter: Payer: Self-pay | Admitting: Family Medicine

## 2022-09-13 VITALS — BP 126/74 | HR 68 | Temp 97.5°F | Resp 16 | Ht 66.0 in | Wt 220.6 lb

## 2022-09-13 DIAGNOSIS — K529 Noninfective gastroenteritis and colitis, unspecified: Secondary | ICD-10-CM | POA: Diagnosis not present

## 2022-09-13 MED ORDER — ONDANSETRON 4 MG PO TBDP: 4.0000 mg | ORAL_TABLET | Freq: Three times a day (TID) | ORAL | 0 refills | Status: DC | PRN

## 2022-09-13 MED ORDER — DICYCLOMINE HCL 10 MG PO CAPS: ORAL_CAPSULE | ORAL | 0 refills | Status: DC

## 2022-09-13 NOTE — Patient Instructions (Addendum)
As long as you are vomiting or having diarrhea, please drink fluids with electrolytes like Gatorade, Powerade, or Pedialyte (if you can stomach the taste). Drink these along with water mainly.   Prioritize consuming fluids over solids.   Try to avoid anti-diarrheals unless necessary.   Let us know if you need anything.

## 2022-09-13 NOTE — Progress Notes (Signed)
Chief Complaint  Patient presents with   Emesis    Here for diarrhea and vomiting      Subjective Paula Stout is a 67 y.o. female who presents with vomiting and diarrhea Symptoms began yesterday.  Patient has cramping, vomiting, diarrhea, myalgias, and nausea Patient denies fever, URI symptoms, cough, and bleeding Treatment to date: none Sick contacts:  has been going around family, maybe her daughter  Past Medical History:  Diagnosis Date   A-fib [facility])    ACS (acute coronary syndrome) ([place]) 07/31/2021   Acute cystitis without hematuria 05/14/2017   Last Assessment & Plan:  Formatting of this note might be different from the original. - suprapubic tenderness with symptoms of urinary frequency and urgency and subjective fevers (no fevers recorded on any visit). Recently passed a renal stone, US shows left hydronephrosis which is improving.  - UA negative8/24 but pt is clinically symptomatic - will repeat UA  - start pt on Microbid for 5 days   Adhesive capsulitis of left shoulder 12/16/2016   Anxiety    Asthma without status asthmaticus 02/06/2021   At risk for falls 02/04/2019   Atrial fibrillation ([place]) 09/01/2014   Last Assessment & Plan:  Formatting of this note might be different from the original. A:  Chronic.  Sinus rhythm at this time.  States she was told this during admission at [facility] in the past. P:  On baby aspirin at home will continue.  Low dose metoprolol started.   Bipolar 1 disorder, depressed, moderate ([place]) 02/18/2020   Capsulitis of left shoulder 10/20/2021   Chronic anticoagulation 02/20/2020   Chronic atrial fibrillation ([place]) 02/18/2020   Chronic headache 10/11/2013   Chronic interstitial cystitis 12/28/2005   Chronic migraine without aura, intractable, without status migrainosus 06/24/2017   Colon cancer screening 04/15/2016   Last Assessment & Plan:  Formatting of this note might be different from the original. Will schedule for colonoscopy    Depression, recurrent ([place]) 02/18/2020   Diabetes mellitus ([place])    Dyslipidemia 16/06/9603   Dysmetabolic syndrome X 54/05/8118   GAD (generalized anxiety disorder) 02/18/2020   GERD (gastroesophageal reflux disease) 11/05/2021   Heartburn 04/15/2016   Last Assessment & Plan:  Formatting of this note might be different from the original. Patient was counseled regarding lifestyle modification  Advised to take Prilosec 30 mins before breakfast  Will schedule for EGD   History of atrial fibrillation 06/24/2017   History of posttraumatic stress disorder (PTSD) 10/07/2016   HLD (hyperlipidemia) 09/01/2014   Last Assessment & Plan:  Formatting of this note might be different from the original. A: Chronic. P: Continue statin.   Hyperosmolarity due to secondary diabetes mellitus ([place]) 02/06/2021   Hypertriglyceridemia 02/18/2020   Impaired mobility and activities of daily living 11/14/2015   Irritable bowel syndrome (IBS)    Junctional tachycardia 11/05/2021   Kidney stone 02/06/2021   Migraine without aura, not refractory 02/06/2021   Mixed hyperlipidemia 04/06/2021   Last Assessment & Plan:  Formatting of this note might be different from the original. 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 Controlled   Refractory migraine with aura 02/06/2021   Refractory migraine without aura 14/78/2956   Renal colic  on left side 04/22/2017   Stage 3 chronic kidney disease ([place]) 02/20/2020   Unspecified adverse effect of other drug, medicinal and biological substance(995.29) 02/17/2006   Urinary tract obstruction 02/06/2021    Exam BP 126/74 (BP Location: Right Arm, Patient Position:  Sitting, Cuff Size: Normal)   Pulse 68   Temp (!) 97.5 F (36.4 C) (Oral)   Resp 16   Ht 5\' 6"  (1.676 m)   Wt 220 lb 9.6 oz (100.1 kg)   SpO2 96%   BMI 35.61 kg/m  General:  well developed, well hydrated, in no apparent distress Skin:  warm, no pallor or diaphoresis, no rashes Throat/Pharynx:  lips and gingiva without lesion; tongue and uvula midline; non-inflamed pharynx; no exudates or postnasal drainage Lungs:  clear to auscultation, breath sounds equal bilaterally, no respiratory distress, no wheezes Cardio:  RRR Abdomen:  abdomen soft, diffuse ttp; bowel sounds normal; no masses or organomegaly Psych: Appropriate judgement/insight  Assessment and Plan  Gastroenteritis - Plan: ondansetron (ZOFRAN-ODT) 4 MG disintegrating tablet, dicyclomine (BENTYL) 10 MG capsule  Zofran and Bentyl prn.  Avoid aggravating foods, discussed advancing diet. Prioritize consuming fluids over solids.  Try to avoid anti-diarrheals unless necessary.  F/u if symptoms fail to improve, sooner if worsening. The patient voiced understanding and agreement to the plan.  [place], DO 09/13/22  3:08 PM

## 2022-09-15 ENCOUNTER — Ambulatory Visit: Payer: Commercial Managed Care - HMO | Attending: Cardiology | Admitting: Cardiology

## 2022-09-15 ENCOUNTER — Encounter: Payer: Self-pay | Admitting: Cardiology

## 2022-09-15 VITALS — BP 98/68 | HR 62 | Ht 66.0 in | Wt 220.0 lb

## 2022-09-15 DIAGNOSIS — E119 Type 2 diabetes mellitus without complications: Secondary | ICD-10-CM

## 2022-09-15 DIAGNOSIS — I10 Essential (primary) hypertension: Secondary | ICD-10-CM | POA: Diagnosis not present

## 2022-09-15 DIAGNOSIS — I4719 Other supraventricular tachycardia: Secondary | ICD-10-CM | POA: Diagnosis not present

## 2022-09-15 DIAGNOSIS — I480 Paroxysmal atrial fibrillation: Secondary | ICD-10-CM

## 2022-09-15 NOTE — Patient Instructions (Signed)

## 2022-09-15 NOTE — Progress Notes (Signed)
Cardiology Office Note:    Date:  09/15/2022   ID:  Paula Stout, DOB 06/24/1956, MRN [id number]  PCP:  Paula Pal, DO  Cardiologist:  Paula Campus, MD    Referring MD: Paula Stout*   Chief Complaint  Patient presents with   Follow-up  Still have a lot of palpitations  History of Present Illness:    Paula Stout is a 67 y.o. female past medical history significant for paroxysmal atrial fibrillation, AV blockade is achieved with beta-blocker, she was taking flecainide however that medication was taken off her medication list last emergency room visit, also history of narrow complex tachycardia which is regular which suspicion for junctional tachycardia however adenosine seems to disrupt this arrhythmia, dyslipidemia, type 2 diabetes, bipolar disorder, bronchial asthma.  She comes today to months for follow-up.  Since I seen her last time there were multiple developments.  She developed recurrent tachyarrhythmia.  Eventually l her flecainide has been discontinued and she was put on metoprolol succinate 50 mg twice daily.  In spite of this maneuver she is still complaining of having multiple episode of palpitations few times a day she is scheduled to have ablation done at the end of February.  Denies have any chest pain tightness squeezing pressure burning chest when she does a palpitation she feels dizzy  Past Medical History:  Diagnosis Date   A-fib [facility])    ACS (acute coronary syndrome) ([place]) 07/31/2021   Acute cystitis without hematuria 05/14/2017   Last Assessment & Plan:  Formatting of this note might be different from the original. - suprapubic tenderness with symptoms of urinary frequency and urgency and subjective fevers (no fevers recorded on any visit). Recently passed a renal stone, US shows left hydronephrosis which is improving.  - UA negative8/24 but pt is clinically symptomatic - will repeat UA  - start pt on Microbid for 5 days   Adhesive  capsulitis of left shoulder 12/16/2016   Anxiety    Asthma without status asthmaticus 02/06/2021   At risk for falls 02/04/2019   Atrial fibrillation ([place]) 09/01/2014   Last Assessment & Plan:  Formatting of this note might be different from the original. A:  Chronic.  Sinus rhythm at this time.  States she was told this during admission at [facility] in the past. P:  On baby aspirin at home will continue.  Low dose metoprolol started.   Bipolar 1 disorder, depressed, moderate ([place]) 02/18/2020   Capsulitis of left shoulder 10/20/2021   Chronic anticoagulation 02/20/2020   Chronic atrial fibrillation ([place]) 02/18/2020   Chronic headache 10/11/2013   Chronic interstitial cystitis 12/28/2005   Chronic migraine without aura, intractable, without status migrainosus 06/24/2017   Colon cancer screening 04/15/2016   Last Assessment & Plan:  Formatting of this note might be different from the original. Will schedule for colonoscopy   Depression, recurrent ([place]) 02/18/2020   Diabetes mellitus ([place])    Dyslipidemia 48/54/6270   Dysmetabolic syndrome X 35/00/9381   GAD (generalized anxiety disorder) 02/18/2020   GERD (gastroesophageal reflux disease) 11/05/2021   Heartburn 04/15/2016   Last Assessment & Plan:  Formatting of this note might be different from the original. Patient was counseled regarding lifestyle modification  Advised to take Prilosec 30 mins before breakfast  Will schedule for EGD   History of atrial fibrillation 06/24/2017   History of posttraumatic stress disorder (PTSD) 10/07/2016   HLD (hyperlipidemia) 09/01/2014   Last Assessment & Plan:  Formatting of this note might be different  from the original. A: Chronic. P: Continue statin.   Hyperosmolarity due to secondary diabetes mellitus ([place]) 02/06/2021   Hypertriglyceridemia 02/18/2020   Impaired mobility and activities of daily living 11/14/2015   Irritable bowel syndrome (IBS)    Junctional tachycardia 11/05/2021   Kidney stone  02/06/2021   Migraine without aura, not refractory 02/06/2021   Mixed hyperlipidemia 04/06/2021   Last Assessment & Plan:  Formatting of this note might be different from the original. Due for labs with PCP in November   Morbid obesity ([place]) 12/16/2016   Paroxysmal supraventricular tachycardia 11/05/2021   Pneumonia, organism unspecified(486) 11/21/2002   Polyneuropathy due to type 2 diabetes mellitus ([place]) 02/06/2021   Primary hypertension 04/06/2021   Last Assessment & Plan:  Formatting of this note might be different from the original. Controlled   Refractory migraine with aura 02/06/2021   Refractory migraine without aura 26/94/8546   Renal colic on left side 27/11/5007   Stage 3 chronic kidney disease ([place]) 02/20/2020   Unspecified adverse effect of other drug, medicinal and biological substance(995.29) 02/17/2006   Urinary tract obstruction 02/06/2021    Past Surgical History:  Procedure Laterality Date   ABDOMINAL HYSTERECTOMY     Fibroids   ATRIAL FIBRILLATION ABLATION     CARDIAC CATHETERIZATION     CARDIOVERSION     CESAREAN SECTION     KNEE SURGERY     LEFT HEART CATH AND CORONARY ANGIOGRAPHY N/A 03/04/2020   Procedure: LEFT HEART CATH AND CORONARY ANGIOGRAPHY;  Surgeon: Paula Crome, MD;  Location: [facility];  Service: Cardiovascular;  Laterality: N/A;   ROTATOR CUFF REPAIR      Current Medications: Current Meds  Medication Sig   acetaminophen (TYLENOL) 500 MG tablet Take 1,000 mg by mouth every 6 (six) hours as needed for mild pain.   albuterol (VENTOLIN HFA) 108 (90 Base) MCG/ACT inhaler Inhale 2 puffs into the lungs every 4 (four) hours as needed for wheezing.   apixaban (ELIQUIS) 5 MG TABS tablet Take 1 tablet (5 mg total) by mouth 2 (two) times daily.   atorvastatin (LIPITOR) 80 MG tablet Take 1 tablet (80 mg total) by mouth daily.   B Complex Vitamins (B COMPLEX PO) Take 1 tablet by mouth daily.   blood glucose meter kit and supplies One Touch  Ultra  Check 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 One Touch Ultra  Check blood sugars once daily Dx: E11.9)   botulinum toxin Type A (BOTOX) 200 units injection Inject 155 units IM into multiple site in the face,neck and head once every 90 days (Patient taking differently: Inject 200 Units into the muscle every 3 (three) months. Inject 155 units IM into multiple site in the face,neck and head once every 90 days)   buPROPion (WELLBUTRIN XL) 300 MG 24 hr tablet Take 1 tablet (300 mg total) by mouth daily.   dicyclomine (BENTYL) 10 MG capsule Take 1 tab every 6 hours as needed for abdominal cramping. (Patient taking differently: Take 10 mg by mouth every 6 (six) hours as needed for spasms. Take 1 tab every 6 hours as needed for abdominal cramping.)   diltiazem (CARDIZEM) 30 MG tablet Take 1 tablet every 4 hours AS NEEDED for AFIB heart rate >100 as long as top BP >100. (Patient taking differently: Take 30 mg by mouth every 4 (four) hours as needed (AFIB HR>100 as long as systolic BP > 381). Take 1 tablet  every 4 hours AS NEEDED for AFIB heart rate >100 as long as top BP >100.)   diphenhydrAMINE (BENADRYL) 25 MG tablet Take 25 mg by mouth as needed for allergies.   EMGALITY 120 MG/ML SOAJ INJECT THE CONTENTS OF ONE PEN UNDER THE SKIN EVERY MONTH (Patient taking differently: Inject 120 mg into the skin every 30 (thirty) days.)   EPINEPHrine 0.3 mg/0.3 mL IJ SOAJ injection Inject 0.3 mg into the muscle as needed for anaphylaxis.   ezetimibe (ZETIA) 10 MG tablet TAKE 1 TABLET DAILY   fenofibrate 160 MG tablet Take 1 tablet (160 mg total) by mouth daily.   glucose blood (ONETOUCH VERIO) test strip Use daily to check blood sugar. (Patient taking differently: 1 each by Other route as needed for other (see instruction). Use daily to check blood sugar.)   isosorbide mononitrate (IMDUR) 30 MG 24 hr tablet TAKE ONE-HALF (1/2) TABLET DAILY    levocetirizine (XYZAL) 5 MG tablet TAKE 1 TABLET EVERY EVENING (Patient taking differently: Take 5 mg by mouth every evening.)   lisinopril (ZESTRIL) 5 MG tablet Take 1 tablet (5 mg total) by mouth daily.   Lurasidone HCl 120 MG TABS Take 1 tablet (120 mg total) by mouth at bedtime.   metoprolol succinate (TOPROL-XL) 50 MG 24 hr tablet Take 1 tablet (50 mg total) by mouth daily. Take with or immediately following a meal. (Patient taking differently: Take 50 mg by mouth 2 (two) times daily. Take with or immediately following a meal.)   metoprolol tartrate (LOPRESSOR) 25 MG tablet Take 1 tablet (25 mg total) by mouth every 4 (four) hours as needed (PALPITATIONS).   nitroGLYCERIN (NITROSTAT) 0.4 MG SL tablet Place 1 tablet (0.4 mg total) under the tongue every 5 (five) minutes as needed for chest pain.   ondansetron (ZOFRAN-ODT) 4 MG disintegrating tablet Take 1 tablet (4 mg total) by mouth every 8 (eight) hours as needed for nausea or vomiting.   pantoprazole (PROTONIX) 40 MG tablet TAKE 1 TABLET DAILY   QUEtiapine (SEROQUEL) 300 MG tablet Take 1 tablet (300 mg total) by mouth at bedtime.   topiramate (TOPAMAX) 50 MG tablet TAKE 3 TABLETS TWICE A DAY (Patient taking differently: Take 50 mg by mouth 2 (two) times daily.)   TRULICITY 1.5 DD/2.2GU SOPN Inject 1.5 mg as directed once a week.   VITAMIN D PO Take 1 tablet by mouth daily.     Allergies:   Bee venom, Onion, Tetanus-diphtheria toxoids td, Sumatriptan, Asa [aspirin], Iodine, Nsaids, Sulfa antibiotics, and Vancomycin   Social History   Socioeconomic History   Marital status: Widowed    Spouse name: Not on file   Number of children: 6   Years of education: Not on file   Highest education level: Not on file  Occupational History   Not on file  Tobacco Use   Smoking status: Never   Smokeless tobacco: Never   Tobacco comments:    Never smoke 07/15/22  Vaping Use   Vaping Use: Never used  Substance and Sexual Activity   Alcohol use:  Never   Drug use: Never   Sexual activity: Not on file  Other Topics Concern   Not on file  Social History Narrative   Not on file   Social Determinants of Health   Financial Resource Strain: Not on file  Food Insecurity: Not on file  Transportation Needs: Not on file  Physical Activity: Not on file  Stress: Not on file  Social Connections: Not on file  Family History: The patient's family history includes Bipolar disorder in her daughter and son; Migraines in her daughter, daughter, and son. She was adopted. ROS:   Please see the history of present illness.    All 14 point review of systems negative except as described per history of present illness  EKGs/Labs/Other Studies Reviewed:      Recent Labs: 07/08/2022: B Natriuretic Peptide 156.8; Magnesium 1.7; TSH 1.272 07/21/2022: ALT 13; BUN 20; Creatinine, Ser 1.92; Hemoglobin 11.8; Platelets 341.0; Potassium 4.5; Sodium 141  Recent Lipid Panel    Component Value Date/Time   CHOL 116 07/21/2022 1052   CHOL 141 02/10/2021 0914   TRIG 126.0 07/21/2022 1052   HDL 27.90 (L) 07/21/2022 1052   HDL 26 (L) 02/10/2021 0914   CHOLHDL 4 07/21/2022 1052   VLDL 25.2 07/21/2022 1052   LDLCALC 63 07/21/2022 1052   LDLCALC 86 02/10/2021 0914    Physical Exam:    VS:  BP 98/68 (BP Location: Left Arm, Patient Position: Sitting)   Pulse 62   Ht 5\' 6"  (1.676 m)   Wt 220 lb (99.8 kg)   BMI 35.51 kg/m     Wt Readings from Last 3 Encounters:  09/15/22 220 lb (99.8 kg)  09/13/22 220 lb 9.6 oz (100.1 kg)  07/21/22 219 lb 8 oz (99.6 kg)     GEN:  Well nourished, well developed in no acute distress HEENT: Normal NECK: No JVD; No carotid bruits LYMPHATICS: No lymphadenopathy CARDIAC: RRR, no murmurs, no rubs, no gallops RESPIRATORY:  Clear to auscultation without rales, wheezing or rhonchi  ABDOMEN: Soft, non-tender, non-distended MUSCULOSKELETAL:  No edema; No deformity  SKIN: Warm and dry LOWER EXTREMITIES: no  swelling NEUROLOGIC:  Alert and oriented x 3 PSYCHIATRIC:  Normal affect   ASSESSMENT:    1. Junctional tachycardia   2. Paroxysmal atrial fibrillation (HCC)   3. Primary hypertension   4. Diabetes mellitus without complication ([place])    PLAN:    In order of problems listed above:  Junctional tachycardia EKG from emergency room visit showed what appears to be junctional tachycardia there is a P wave in front of QRS complex best seen in lead aVF.  She is on beta-blocker I was hoping to be able to increase dose of beta-blocker however I cannot do it since he does have already first-degree block and resting bradycardia, her flecainide has been discontinued.  I think the best hope for management of this problem will be ablation that she is already scheduled. Paroxysmal atrial fibrillation denies having any irregularity on her tachyarrhythmia when she has it.  Plan is to again consider ablation.  Continue anticoagulation. Essential hypertension blood pressure well-controlled continue present management.  Diabetes may be followed by antimedicine team currently stable   Medication Adjustments/Labs and Tests Ordered: Current medicines are reviewed at length with the patient today.  Concerns regarding medicines are outlined above.  No orders of the defined types were placed in this encounter.  Medication changes: No orders of the defined types were placed in this encounter.   Signed, Park Liter, MD, [facility] 09/15/2022 12:07 PM    [place]

## 2022-09-15 NOTE — Addendum Note (Signed)
Addended by: Darrel Reach on: 09/15/2022 04:44 PM   Modules accepted: Orders

## 2022-09-16 DIAGNOSIS — I129 Hypertensive chronic kidney disease with stage 1 through stage 4 chronic kidney disease, or unspecified chronic kidney disease: Secondary | ICD-10-CM | POA: Diagnosis not present

## 2022-09-16 DIAGNOSIS — N183 Chronic kidney disease, stage 3 unspecified: Secondary | ICD-10-CM | POA: Diagnosis not present

## 2022-09-16 DIAGNOSIS — R809 Proteinuria, unspecified: Secondary | ICD-10-CM | POA: Diagnosis not present

## 2022-09-16 DIAGNOSIS — N390 Urinary tract infection, site not specified: Secondary | ICD-10-CM | POA: Diagnosis not present

## 2022-09-16 DIAGNOSIS — N189 Chronic kidney disease, unspecified: Secondary | ICD-10-CM | POA: Diagnosis not present

## 2022-09-17 ENCOUNTER — Other Ambulatory Visit: Payer: Self-pay | Admitting: Nephrology

## 2022-09-17 DIAGNOSIS — N183 Chronic kidney disease, stage 3 unspecified: Secondary | ICD-10-CM

## 2022-09-17 LAB — LAB REPORT - SCANNED
Creatinine, POC: 224.3 mg/dL
EGFR: 30
Protein/Creatinine Ratio: 109

## 2022-09-21 ENCOUNTER — Ambulatory Visit
Admission: RE | Admit: 2022-09-21 | Discharge: 2022-09-21 | Disposition: A | Discharge status: Home / Self Care | Payer: Medicare (Managed Care) | Source: Ambulatory Visit | Attending: Nephrology | Admitting: Nephrology

## 2022-09-21 ENCOUNTER — Telehealth: Payer: Self-pay | Admitting: Neurology

## 2022-09-21 DIAGNOSIS — N189 Chronic kidney disease, unspecified: Secondary | ICD-10-CM | POA: Diagnosis not present

## 2022-09-21 DIAGNOSIS — N183 Chronic kidney disease, stage 3 unspecified: Secondary | ICD-10-CM

## 2022-09-21 NOTE — Telephone Encounter (Signed)
Pt called in and left a message stating her insurance called her and said they didn't know if her new [place] insurance was going to cover her botox and that they had to call our office. She hasn't heard back and wasn't sure what she should do?

## 2022-09-23 ENCOUNTER — Encounter: Payer: Self-pay | Admitting: Family Medicine

## 2022-09-24 ENCOUNTER — Ambulatory Visit: Payer: Medicare (Managed Care) | Admitting: Neurology

## 2022-09-24 DIAGNOSIS — G43709 Chronic migraine without aura, not intractable, without status migrainosus: Secondary | ICD-10-CM | POA: Diagnosis not present

## 2022-09-24 MED ORDER — ONABOTULINUMTOXINA 100 UNITS IJ SOLR
200.0000 [IU] | Freq: Once | INTRAMUSCULAR | Status: AC
  Administered 2022-09-24: 155 [IU] via INTRAMUSCULAR

## 2022-09-24 NOTE — Progress Notes (Signed)
[facility]  ° °Procedure Note Botox ° °Attending: Dr. Zabella Wease ° °Preoperative Diagnosis(es): Chronic migraine ° °Consent obtained from: The patient °Benefits discussed included, but were not limited to decreased muscle tightness, increased joint range of motion, and decreased pain.  Risk discussed included, but were not limited pain and discomfort, bleeding, bruising, excessive weakness, venous thrombosis, muscle atrophy and dysphagia.  Anticipated outcomes of the procedure as well as he risks and benefits of the alternatives to the procedure, and the roles and tasks of the personnel to be involved, were discussed with the patient, and the patient consents to the procedure and agrees to proceed. A copy of the patient medication guide was given to the patient which explains the blackbox warning. ° °Patients identity and treatment sites confirmed Yes.  . ° °Details of Procedure: °Skin was cleaned with alcohol. Prior to injection, the needle plunger was aspirated to make sure the needle was not within a blood vessel.  There was no blood retrieved on aspiration.   ° °Following is a summary of the muscles injected  And the amount of Botulinum toxin used: ° °Dilution °200 units of Botox was reconstituted with 4 ml of preservative free normal saline. °Time of reconstitution: At the time of the office visit (<30 minutes prior to injection)  ° °Injections  °155 total units of Botox was injected with a 30 gauge needle. ° °Injection Sites: °L occipitalis: 15 units- 3 sites  °R occiptalis: 15 units- 3 sites ° °L upper trapezius: 15 units- 3 sites °R upper trapezius: 15 units- 3 sits          °L paraspinal: 10 units- 2 sites °R paraspinal: 10 units- 2 sites ° °Face °L frontalis(2 injection sites):10 units   °R frontalis(2 injection sites):10 units         °L corrugator: 5 units   °R corrugator: 5 units           °Procerus: 5 units   °L temporalis: 20 units °R temporalis: 20 units  ° °Agent:  °200 units of botulinum Type  A (Onobotulinum Toxin type A) was reconstituted with 4 ml of preservative free normal saline.  °Time of reconstitution: At the time of the office visit (<30 minutes prior to injection)  ° ° ° Total injected (Units):  155 ° Total wasted (Units):  45 ° °Patient tolerated procedure well without complications.   °Reinjection is anticipated in 3 months. ° ° °

## 2022-10-12 ENCOUNTER — Telehealth: Payer: Self-pay

## 2022-10-12 NOTE — Telephone Encounter (Signed)
Per [facility] PA needed for botox

## 2022-10-13 ENCOUNTER — Other Ambulatory Visit: Payer: Self-pay | Admitting: *Deleted

## 2022-10-13 DIAGNOSIS — I480 Paroxysmal atrial fibrillation: Secondary | ICD-10-CM | POA: Diagnosis not present

## 2022-10-13 DIAGNOSIS — Z01818 Encounter for other preprocedural examination: Secondary | ICD-10-CM | POA: Diagnosis not present

## 2022-10-14 ENCOUNTER — Encounter (HOSPITAL_COMMUNITY): Payer: Self-pay | Admitting: *Deleted

## 2022-10-14 LAB — CBC WITH DIFFERENTIAL/PLATELET
Basophils Absolute: 0.1 10*3/uL (ref 0.0–0.2)
Basos: 1 %
EOS (ABSOLUTE): 0.3 10*3/uL (ref 0.0–0.4)
Eos: 3 %
Hematocrit: 37.6 % (ref 34.0–46.6)
Hemoglobin: 12.1 g/dL (ref 11.1–15.9)
Immature Grans (Abs): 0 10*3/uL (ref 0.0–0.1)
Immature Granulocytes: 0 %
Lymphocytes Absolute: 1.9 10*3/uL (ref 0.7–3.1)
Lymphs: 24 %
MCH: 30.9 pg (ref 26.6–33.0)
MCHC: 32.2 g/dL (ref 31.5–35.7)
MCV: 96 fL (ref 79–97)
Monocytes Absolute: 0.6 10*3/uL (ref 0.1–0.9)
Monocytes: 7 %
Neutrophils Absolute: 5.4 10*3/uL (ref 1.4–7.0)
Neutrophils: 65 %
Platelets: 293 10*3/uL (ref 150–450)
RBC: 3.91 x10E6/uL (ref 3.77–5.28)
RDW: 13.8 % (ref 11.7–15.4)
WBC: 8.3 10*3/uL (ref 3.4–10.8)

## 2022-10-14 LAB — BASIC METABOLIC PANEL
BUN/Creatinine Ratio: 12 (ref 12–28)
BUN: 23 mg/dL (ref 8–27)
CO2: 18 mmol/L — ABNORMAL LOW (ref 20–29)
Calcium: 9.8 mg/dL (ref 8.7–10.3)
Chloride: 107 mmol/L — ABNORMAL HIGH (ref 96–106)
Creatinine, Ser: 1.94 mg/dL — ABNORMAL HIGH (ref 0.57–1.00)
Glucose: 142 mg/dL — ABNORMAL HIGH (ref 70–99)
Potassium: 4.3 mmol/L (ref 3.5–5.2)
Sodium: 142 mmol/L (ref 134–144)
eGFR: 28 mL/min/{1.73_m2} — ABNORMAL LOW (ref >59)

## 2022-10-18 ENCOUNTER — Telehealth: Payer: Self-pay | Admitting: Cardiology

## 2022-10-18 NOTE — Telephone Encounter (Signed)
Patient stated her CT CARDIAC MORPH test was cancelled due to her blood test results.  Patient would like to know what does this mean for her upcoming procedure.

## 2022-10-18 NOTE — Telephone Encounter (Signed)
Patient would like a call back to discuss a procedure which she states just showed up on her MyChart.

## 2022-10-18 NOTE — Telephone Encounter (Signed)
I spoke with the patient and explained the details of her procedure. She is aware of having a TEE prior to her Ablation.   Per Dr. Curt Bears, they will do an EKG prior to the TEE and if she is in NSR the TEE will be cancelled.

## 2022-10-20 ENCOUNTER — Ambulatory Visit (HOSPITAL_BASED_OUTPATIENT_CLINIC_OR_DEPARTMENT_OTHER): Payer: Medicare (Managed Care)

## 2022-10-26 NOTE — Pre-Procedure Instructions (Signed)
Attempted to call patient regarding procedure instructions.  Instructed patient on the following items: Arrival time 1200 Nothing to eat or drink after midnight No meds AM of procedure Responsible person to drive you home and stay with you for 24 hrs  Have you missed any doses of anti-coagulant Eliquis- if you have missed any doses please let office know right away.

## 2022-10-27 ENCOUNTER — Encounter (HOSPITAL_COMMUNITY): Payer: Self-pay

## 2022-10-27 ENCOUNTER — Ambulatory Visit (HOSPITAL_COMMUNITY): Payer: Medicare (Managed Care)

## 2022-10-27 ENCOUNTER — Encounter (HOSPITAL_COMMUNITY)
Admission: RE | Disposition: A | Discharge status: Home / Self Care | Payer: Medicare (Managed Care) | Source: Ambulatory Visit | Attending: Internal Medicine

## 2022-10-27 ENCOUNTER — Encounter (HOSPITAL_COMMUNITY): Payer: Self-pay | Admitting: Internal Medicine

## 2022-10-27 ENCOUNTER — Observation Stay (HOSPITAL_COMMUNITY)
Admission: RE | Admit: 2022-10-27 | Discharge: 2022-10-28 | Disposition: A | Discharge status: Home / Self Care | Payer: Medicare (Managed Care) | Source: Ambulatory Visit | Attending: Internal Medicine | Admitting: Internal Medicine

## 2022-10-27 ENCOUNTER — Ambulatory Visit (HOSPITAL_BASED_OUTPATIENT_CLINIC_OR_DEPARTMENT_OTHER): Payer: Medicare (Managed Care) | Admitting: Anesthesiology

## 2022-10-27 ENCOUNTER — Other Ambulatory Visit: Payer: Self-pay

## 2022-10-27 ENCOUNTER — Ambulatory Visit (HOSPITAL_COMMUNITY): Payer: Medicare (Managed Care) | Admitting: Anesthesiology

## 2022-10-27 DIAGNOSIS — J45909 Unspecified asthma, uncomplicated: Secondary | ICD-10-CM | POA: Diagnosis not present

## 2022-10-27 DIAGNOSIS — Z7984 Long term (current) use of oral hypoglycemic drugs: Secondary | ICD-10-CM | POA: Diagnosis not present

## 2022-10-27 DIAGNOSIS — E119 Type 2 diabetes mellitus without complications: Secondary | ICD-10-CM | POA: Diagnosis not present

## 2022-10-27 DIAGNOSIS — M1990 Unspecified osteoarthritis, unspecified site: Secondary | ICD-10-CM

## 2022-10-27 DIAGNOSIS — R002 Palpitations: Secondary | ICD-10-CM | POA: Diagnosis present

## 2022-10-27 DIAGNOSIS — Z7901 Long term (current) use of anticoagulants: Secondary | ICD-10-CM | POA: Insufficient documentation

## 2022-10-27 DIAGNOSIS — N183 Chronic kidney disease, stage 3 unspecified: Secondary | ICD-10-CM | POA: Diagnosis not present

## 2022-10-27 DIAGNOSIS — I480 Paroxysmal atrial fibrillation: Principal | ICD-10-CM | POA: Insufficient documentation

## 2022-10-27 DIAGNOSIS — F418 Other specified anxiety disorders: Secondary | ICD-10-CM | POA: Diagnosis not present

## 2022-10-27 DIAGNOSIS — I4891 Unspecified atrial fibrillation: Secondary | ICD-10-CM

## 2022-10-27 DIAGNOSIS — I10 Essential (primary) hypertension: Secondary | ICD-10-CM

## 2022-10-27 DIAGNOSIS — I319 Disease of pericardium, unspecified: Secondary | ICD-10-CM | POA: Diagnosis present

## 2022-10-27 DIAGNOSIS — E1122 Type 2 diabetes mellitus with diabetic chronic kidney disease: Secondary | ICD-10-CM | POA: Insufficient documentation

## 2022-10-27 DIAGNOSIS — I482 Chronic atrial fibrillation, unspecified: Secondary | ICD-10-CM

## 2022-10-27 DIAGNOSIS — I4892 Unspecified atrial flutter: Secondary | ICD-10-CM | POA: Diagnosis not present

## 2022-10-27 HISTORY — PX: ATRIAL FIBRILLATION ABLATION: EP1191

## 2022-10-27 LAB — POCT ACTIVATED CLOTTING TIME: Activated Clotting Time: 233 seconds

## 2022-10-27 LAB — GLUCOSE, CAPILLARY
Glucose-Capillary: 94 mg/dL (ref 70–99)
Glucose-Capillary: 134 mg/dL — ABNORMAL HIGH (ref 70–99)

## 2022-10-27 SURGERY — ATRIAL FIBRILLATION ABLATION
Anesthesia: General

## 2022-10-27 MED ORDER — PHENYLEPHRINE HCL-NACL 20-0.9 MG/250ML-% IV SOLN
INTRAVENOUS | Status: DC | PRN
  Administered 2022-10-27: 50 ug/min via INTRAVENOUS
  Administered 2022-10-27: 240 ug via INTRAVENOUS
  Administered 2022-10-27: 160 ug via INTRAVENOUS

## 2022-10-27 MED ORDER — ACETAMINOPHEN 325 MG PO TABS
650.0000 mg | ORAL_TABLET | ORAL | Status: DC | PRN
  Administered 2022-10-27 – 2022-10-28 (×2): 650 mg via ORAL

## 2022-10-27 MED ORDER — ONDANSETRON HCL 4 MG/2ML IJ SOLN
INTRAMUSCULAR | Status: DC | PRN
  Administered 2022-10-27: 4 mg via INTRAVENOUS

## 2022-10-27 MED ORDER — ACETAMINOPHEN 325 MG PO TABS
650.0000 mg | ORAL_TABLET | ORAL | Status: DC | PRN
  Administered 2022-10-27: 650 mg via ORAL
  Filled 2022-10-27 (×2): qty 2

## 2022-10-27 MED ORDER — SODIUM CHLORIDE 0.9% FLUSH: 3.0000 mL | INTRAVENOUS | Status: DC | PRN

## 2022-10-27 MED ORDER — PROTAMINE SULFATE 10 MG/ML IV SOLN
INTRAVENOUS | Status: DC | PRN
  Administered 2022-10-27: 40 mg via INTRAVENOUS

## 2022-10-27 MED ORDER — FENTANYL CITRATE (PF) 250 MCG/5ML IJ SOLN
INTRAMUSCULAR | Status: DC | PRN
  Administered 2022-10-27: 100 ug via INTRAVENOUS

## 2022-10-27 MED ORDER — HEPARIN SODIUM (PORCINE) 1000 UNIT/ML IJ SOLN
INTRAMUSCULAR | Status: DC | PRN
  Administered 2022-10-27: 1000 [IU] via INTRAVENOUS

## 2022-10-27 MED ORDER — ACETAMINOPHEN 325 MG PO TABS
ORAL_TABLET | ORAL | Status: AC
  Filled 2022-10-27: qty 2

## 2022-10-27 MED ORDER — HEPARIN (PORCINE) IN NACL 1000-0.9 UT/500ML-% IV SOLN
INTRAVENOUS | Status: DC | PRN
  Administered 2022-10-27 (×4): 500 mL

## 2022-10-27 MED ORDER — NITROGLYCERIN 0.4 MG SL SUBL: 0.4000 mg | SUBLINGUAL_TABLET | SUBLINGUAL | Status: DC | PRN

## 2022-10-27 MED ORDER — HEPARIN SODIUM (PORCINE) 1000 UNIT/ML IJ SOLN
INTRAMUSCULAR | Status: DC | PRN
  Administered 2022-10-27: 14000 [IU] via INTRAVENOUS

## 2022-10-27 MED ORDER — METOPROLOL TARTRATE 5 MG/5ML IV SOLN
INTRAVENOUS | Status: AC
  Administered 2022-10-27: 2.5 mg
  Filled 2022-10-27: qty 5

## 2022-10-27 MED ORDER — FENTANYL CITRATE (PF) 100 MCG/2ML IJ SOLN
25.0000 ug | Freq: Once | INTRAMUSCULAR | Status: AC
  Administered 2022-10-27: 25 ug via INTRAVENOUS

## 2022-10-27 MED ORDER — MIDAZOLAM HCL 2 MG/2ML IJ SOLN
INTRAMUSCULAR | Status: DC | PRN
  Administered 2022-10-27 (×2): 2 mg via INTRAVENOUS

## 2022-10-27 MED ORDER — SUGAMMADEX SODIUM 200 MG/2ML IV SOLN
INTRAVENOUS | Status: DC | PRN
  Administered 2022-10-27: 200 mg via INTRAVENOUS

## 2022-10-27 MED ORDER — DOBUTAMINE INFUSION FOR EP/ECHO/NUC (1000 MCG/ML)
INTRAVENOUS | Status: DC | PRN
  Administered 2022-10-27: 20 ug/kg/min via INTRAVENOUS

## 2022-10-27 MED ORDER — DEXAMETHASONE SODIUM PHOSPHATE 10 MG/ML IJ SOLN
INTRAMUSCULAR | Status: DC | PRN
  Administered 2022-10-27: 5 mg via INTRAVENOUS

## 2022-10-27 MED ORDER — ROCURONIUM BROMIDE 10 MG/ML (PF) SYRINGE
PREFILLED_SYRINGE | INTRAVENOUS | Status: DC | PRN
  Administered 2022-10-27: 70 mg via INTRAVENOUS

## 2022-10-27 MED ORDER — DOBUTAMINE INFUSION FOR EP/ECHO/NUC (1000 MCG/ML)
INTRAVENOUS | Status: AC
  Filled 2022-10-27: qty 250

## 2022-10-27 MED ORDER — NITROGLYCERIN 0.4 MG SL SUBL
0.4000 mg | SUBLINGUAL_TABLET | SUBLINGUAL | Status: AC | PRN
  Administered 2022-10-27 (×2): 0.4 mg via SUBLINGUAL

## 2022-10-27 MED ORDER — NITROGLYCERIN 0.4 MG SL SUBL
SUBLINGUAL_TABLET | SUBLINGUAL | Status: AC
  Administered 2022-10-27: 0.4 mg
  Filled 2022-10-27: qty 3

## 2022-10-27 MED ORDER — FENTANYL CITRATE (PF) 100 MCG/2ML IJ SOLN
INTRAMUSCULAR | Status: AC
  Filled 2022-10-27: qty 2

## 2022-10-27 MED ORDER — ONDANSETRON HCL 4 MG/2ML IJ SOLN
4.0000 mg | Freq: Four times a day (QID) | INTRAMUSCULAR | Status: DC | PRN
  Filled 2022-10-27: qty 2

## 2022-10-27 MED ORDER — SODIUM CHLORIDE 0.9 % IV SOLN: 250.0000 mL | INTRAVENOUS | Status: DC | PRN

## 2022-10-27 MED ORDER — ONDANSETRON HCL 4 MG/2ML IJ SOLN
4.0000 mg | Freq: Four times a day (QID) | INTRAMUSCULAR | Status: DC | PRN
  Administered 2022-10-28: 4 mg via INTRAVENOUS

## 2022-10-27 MED ORDER — COLCHICINE 0.6 MG PO TABS
0.6000 mg | ORAL_TABLET | Freq: Two times a day (BID) | ORAL | Status: DC
  Administered 2022-10-27 – 2022-10-28 (×2): 0.6 mg via ORAL
  Filled 2022-10-27 (×3): qty 1

## 2022-10-27 MED ORDER — SODIUM CHLORIDE 0.9% FLUSH
3.0000 mL | Freq: Two times a day (BID) | INTRAVENOUS | Status: DC
  Administered 2022-10-27 – 2022-10-28 (×2): 3 mL via INTRAVENOUS

## 2022-10-27 MED ORDER — PROPOFOL 10 MG/ML IV BOLUS
INTRAVENOUS | Status: DC | PRN
  Administered 2022-10-27: 200 mg via INTRAVENOUS

## 2022-10-27 MED ORDER — SODIUM CHLORIDE 0.9 % IV SOLN: INTRAVENOUS | Status: DC

## 2022-10-27 SURGICAL SUPPLY — 19 items
BAG SNAP BAND KOVER 36X36 (MISCELLANEOUS) IMPLANT
CATH ABLAT QDOT MICRO BI TC DF (CATHETERS) IMPLANT
CATH OCTARAY 2.0 F 3-3-3-3-3 (CATHETERS) IMPLANT
CATH PIGTAIL STEERABLE D1 8.7 (WIRE) IMPLANT
CATH S-M CIRCA TEMP PROBE (CATHETERS) IMPLANT
CATH SOUNDSTAR ECO 8FR (CATHETERS) IMPLANT
CATH WEB BI DIR CSDF CRV REPRO (CATHETERS) IMPLANT
CLOSURE PERCLOSE PROSTYLE (VASCULAR PRODUCTS) IMPLANT
COVER SWIFTLINK CONNECTOR (BAG) ×1 IMPLANT
PACK EP LATEX FREE (CUSTOM PROCEDURE TRAY) ×1
PACK EP LF (CUSTOM PROCEDURE TRAY) ×1 IMPLANT
PAD DEFIB RADIO PHYSIO CONN (PAD) ×1 IMPLANT
PATCH CARTO3 (PAD) IMPLANT
SHEATH CARTO VIZIGO SM CVD (SHEATH) IMPLANT
SHEATH PINNACLE 7F 10CM (SHEATH) IMPLANT
SHEATH PINNACLE 8F 10CM (SHEATH) IMPLANT
SHEATH PINNACLE 9F 10CM (SHEATH) IMPLANT
SHEATH PROBE COVER 6X72 (BAG) IMPLANT
TUBING SMART ABLATE COOLFLOW (TUBING) IMPLANT

## 2022-10-27 NOTE — H&P (Signed)
Electrophysiology Office Note   Date:  10/27/2022   ID:  Paula, Stout 11-14-1955, MRN [id number]  PCP:  [name], DO  Cardiologist: Revankar Primary Electrophysiologist:  Jakya Dovidio [name], MD    Chief Complaint: Atrial fibrillation   History of Present Illness: Paula Stout is a 67 y.o. female who is being seen today for the evaluation of atrial fibrillation at the request of No ref. provider found. Presenting today for electrophysiology evaluation.  She has a history significant for paroxysmal atrial fibrillation, type 2 diabetes, hyperlipidemia, stage III CKD.  She is post ablation with PVI and CTI in [place] 09/27/2018.  She presented the emergency room of 11/06/2021 with palpitations and was found to have SVT.  She was given adenosine which converted to sinus rhythm.  She had another episode of SVT on a trip to [place].  At this point, she would prefer to avoid further medication management and would like ablation.  Review of her EKG, it appears that she was actually in atrial fibrillation and not SVT.  She did receive adenosine which failed to convert her arrhythmia.  She would like to avoid further medication management.  Today, denies symptoms of palpitations, chest pain, shortness of breath, orthopnea, PND, lower extremity edema, claudication, dizziness, presyncope, syncope, bleeding, or neurologic sequela. The patient is tolerating medications without difficulties. Plan ablation today.    Past Medical History:  Diagnosis Date   A-fib [facility])    ACS (acute coronary syndrome) ([place]) 07/31/2021   Acute cystitis without hematuria 05/14/2017   Last Assessment & Plan:  Formatting of this note might be different from the original. - suprapubic tenderness with symptoms of urinary frequency and urgency and subjective fevers (no fevers recorded on any visit). Recently passed a renal stone, US shows left hydronephrosis which is improving.  - UA negative8/24 but pt is  clinically symptomatic - Eivin Mascio repeat UA  - start pt on Microbid for 5 days   Adhesive capsulitis of left shoulder 12/16/2016   Anxiety    Asthma without status asthmaticus 02/06/2021   At risk for falls 02/04/2019   Atrial fibrillation ([place]) 09/01/2014   Last Assessment & Plan:  Formatting of this note might be different from the original. A:  Chronic.  Sinus rhythm at this time.  States she was told this during admission at [facility] in the past. P:  On baby aspirin at home Tavarion Babington continue.  Low dose metoprolol started.   Bipolar 1 disorder, depressed, moderate ([place]) 02/18/2020   Capsulitis of left shoulder 10/20/2021   Chronic anticoagulation 02/20/2020   Chronic atrial fibrillation ([place]) 02/18/2020   Chronic headache 10/11/2013   Chronic interstitial cystitis 12/28/2005   Chronic migraine without aura, intractable, without status migrainosus 06/24/2017   Colon cancer screening 04/15/2016   Last Assessment & Plan:  Formatting of this note might be different from the original. [name] schedule for colonoscopy   Depression, recurrent ([place]) 02/18/2020   Diabetes mellitus ([place])    Dyslipidemia [id number]   Dysmetabolic syndrome X 0000000   GAD (generalized anxiety disorder) 02/18/2020   GERD (gastroesophageal reflux disease) 11/05/2021   Heartburn 04/15/2016   Last Assessment & Plan:  Formatting of this note might be different from the original. Patient was counseled regarding lifestyle modification  Advised to take Prilosec 30 mins before breakfast  [name] schedule for EGD   History of atrial fibrillation 06/24/2017   History of posttraumatic stress disorder (PTSD) 10/07/2016   HLD (hyperlipidemia) 09/01/2014   Last Assessment &  Plan:  Formatting of this note might be different from the original. A: Chronic. P: Continue statin.   Hyperosmolarity due to secondary diabetes mellitus ([place]) 02/06/2021   Hypertriglyceridemia 02/18/2020   Impaired mobility and activities of daily living 11/14/2015    Irritable bowel syndrome (IBS)    Junctional tachycardia 11/05/2021   Kidney stone 02/06/2021   Migraine without aura, not refractory 02/06/2021   Mixed hyperlipidemia 04/06/2021   Last Assessment & Plan:  Formatting of this note might be different from the original. Due for labs with PCP in November   Morbid obesity ([place]) 12/16/2016   Paroxysmal supraventricular tachycardia 11/05/2021   Pneumonia, organism unspecified(486) 11/21/2002   Polyneuropathy due to type 2 diabetes mellitus ([place]) 02/06/2021   Primary hypertension 04/06/2021   Last Assessment & Plan:  Formatting of this note might be different from the original. Controlled   Refractory migraine with aura 02/06/2021   Refractory migraine without aura [id number]   Renal colic on left side [id number]   Stage 3 chronic kidney disease ([place]) 02/20/2020   Unspecified adverse effect of other drug, medicinal and biological substance(995.29) 02/17/2006   Urinary tract obstruction 02/06/2021   Past Surgical History:  Procedure Laterality Date   ABDOMINAL HYSTERECTOMY     Fibroids   ATRIAL FIBRILLATION ABLATION     CARDIAC CATHETERIZATION     CARDIOVERSION     CESAREAN SECTION     KNEE SURGERY     LEFT HEART CATH AND CORONARY ANGIOGRAPHY N/A 03/04/2020   Procedure: LEFT HEART CATH AND CORONARY ANGIOGRAPHY;  Surgeon: Belva Crome, MD;  Location: [facility];  Service: Cardiovascular;  Laterality: N/A;   ROTATOR CUFF REPAIR       Current Facility-Administered Medications  Medication Dose Route Frequency Provider Last Rate Last Admin   0.9 %  sodium chloride infusion   Intravenous Continuous Kimbley Sprague Hassell Done, MD        Allergies:   Bee venom, Onion, Tetanus-diphtheria toxoids td, Sumatriptan, Asa [aspirin], Iodine, Nsaids, Sulfa antibiotics, and Vancomycin   Social History:  The patient  reports that she has never smoked. She has never used smokeless tobacco. She reports that she does not drink alcohol and does not  use drugs.   Family History:  The patient's family history includes Bipolar disorder in her daughter and son; Migraines in her daughter, daughter, and son. She was adopted.   ROS:  Please see the history of present illness.   Otherwise, review of systems is positive for none.   All other systems are reviewed and negative.   PHYSICAL EXAM: VS:  BP (!) 153/70   Pulse 61   Temp (!) 97 F (36.1 C) (Temporal)   Resp 17   Ht 5' 6"$  (1.676 m)   Wt 99.3 kg   SpO2 98%   BMI 35.35 kg/m  , BMI Body mass index is 35.35 kg/m. GEN: Well nourished, well developed, in no acute distress  HEENT: normal  Neck: no JVD, carotid bruits, or masses Cardiac: RRR; no murmurs, rubs, or gallops,no edema  Respiratory:  clear to auscultation bilaterally, normal work of breathing GI: soft, nontender, nondistended, + BS MS: no deformity or atrophy  Skin: warm and dry Neuro:  Strength and sensation are intact Psych: euthymic mood, full affect    Recent Labs: 07/08/2022: B Natriuretic Peptide 156.8; Magnesium 1.7; TSH 1.272 07/21/2022: ALT 13 10/13/2022: BUN 23; Creatinine, Ser 1.94; Hemoglobin 12.1; Platelets 293; Potassium 4.3; Sodium 142    Lipid Panel  Component Value Date/Time   CHOL 116 07/21/2022 1052   CHOL 141 02/10/2021 0914   TRIG 126.0 07/21/2022 1052   HDL 27.90 (L) 07/21/2022 1052   HDL 26 (L) 02/10/2021 0914   CHOLHDL 4 07/21/2022 1052   VLDL 25.2 07/21/2022 1052   LDLCALC 63 07/21/2022 1052   LDLCALC 86 02/10/2021 0914     Wt Readings from Last 3 Encounters:  10/27/22 99.3 kg  09/15/22 99.8 kg  09/13/22 100.1 kg      Other studies Reviewed: Additional studies/ records that were reviewed today include: TTE 02/20/20  Review of the above records today demonstrates:   1. Left ventricular ejection fraction, by estimation, is 60 to 65%. The  left ventricle has normal function. The left ventricle has no regional  wall motion abnormalities. Left ventricular diastolic parameters  are  consistent with Grade I diastolic  dysfunction (impaired relaxation). Elevated left ventricular end-diastolic  pressure.   2. Right ventricular systolic function is normal. The right ventricular  size is normal. There is normal pulmonary artery systolic pressure. The  estimated right ventricular systolic pressure is 0000000 mmHg.   3. The mitral valve is normal in structure. No evidence of mitral valve  regurgitation. No evidence of mitral stenosis.   4. The aortic valve is normal in structure. Aortic valve regurgitation is  not visualized. No aortic stenosis is present.   5. Aortic dilatation noted. 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   6. The inferior vena cava is normal in size with greater than 50%  respiratory variability, suggesting right atrial pressure of 3 mmHg.   LHC 03/04/20 Left dominant coronary anatomy Normal left main Normal LAD that wraps around the left ventricular apex and supplies one half of the posterior interventricular groove territory. Large first diagonal/ramus intermedius that covers the distribution typically supplied by diagonal branches. Nondominant right coronary Dominant circumflex coronary artery.  The circumflex coronary is tortuous including the first obtuse marginal branch. Normal left ventricular end-diastolic pressure.  Ventriculography not performed to limit contrast exposure.  Cardiac monitor 05/07/2020 personally reviewed Max 146 bpm 03:36am, 08/16 Min 52 bpm 05:33am, 08/08 Avg 68 bpm 1.7% PACs, <1% PVCs Predominant rhythm was sinus rhythm 3 short SVT episodes, longest 5 beats All triggered events/symptoms associated with sinus rhythm  Myoview 11/25/2020 Nuclear stress EF: 64%. The left ventricular ejection fraction is normal (55-65%). No T wave inversion was noted during stress. There was no ST segment deviation noted during stress. Defect 1: There is a small defect of mild severity present in the apex  location. This is a low risk study.   ASSESSMENT AND PLAN:  1.  Paroxysmal atrial fibrillation: Marvell Lantrip has presented today for surgery, with the diagnosis of AF.  The various methods of treatment have been discussed with the patient and family. After consideration of risks, benefits and other options for treatment, the patient has consented to  Procedure(s): Catheter ablation as a surgical intervention .  Risks include but not limited to complete heart block, stroke, esophageal damage, nerve damage, bleeding, vascular damage, tamponade, perforation, MI, and death. The patient's history has been reviewed, patient examined, no change in status, stable for surgery.  I have reviewed the patient's chart and labs.  Questions were answered to the patient's satisfaction.    Modestine Scherzinger Curt Bears, MD 10/27/2022 1:22 PM

## 2022-10-27 NOTE — Progress Notes (Signed)
  Cardiology update: 67 y/o woman with PAF who is POD 0, s/p afib ablation. For details, please refer to HPI earlier today. Post procedure, patient complaining of chest pain. Admitted for mnonitorin. While being transferred to the floor, patient had an episode of narrow complex tachycardia at 150 bpm which self terminated after 30 seconds. She had a second episode while in her room, this one lasting approx 2 min.  Bedside echocardiogram performed. No effusion. Colchicine ordered.

## 2022-10-27 NOTE — Anesthesia Preprocedure Evaluation (Addendum)
Anesthesia Evaluation  Patient identified by MRN, date of birth, ID band Patient awake    Reviewed: Allergy & Precautions, NPO status , Patient's Chart, lab work & pertinent test results, reviewed documented beta blocker date and time   History of Anesthesia Complications (+) AWARENESS UNDER ANESTHESIA and history of anesthetic complications  Airway Mallampati: II  TM Distance: >3 FB Neck ROM: Full    Dental no notable dental hx. (+) Teeth Intact, Caps, Dental Advisory Given   Pulmonary asthma    Pulmonary exam normal breath sounds clear to auscultation       Cardiovascular hypertension, Pt. on medications and Pt. on home beta blockers + angina with exertion + dysrhythmias Atrial Fibrillation and Supra Ventricular Tachycardia  Rhythm:Regular Rate:Normal  EKG 10/27/22 NSR, 1st deg AVB, Non specific ST-T wave abnormalities  Echo 12/17/21 1. Left ventricular ejection fraction, by estimation, is 50 to 55%. The  left ventricle has low normal function. The left ventricle has no regional  wall motion abnormalities. There is mild left ventricular hypertrophy.  Left ventricular diastolic  parameters are consistent with Grade II diastolic dysfunction  (pseudonormalization).   2. Right ventricular systolic function is normal. The right ventricular  size is normal. There is normal pulmonary artery systolic pressure.   3. Left atrial size was mildly dilated.   4. Right atrial size was mildly dilated.   5. The mitral valve is normal in structure. No evidence of mitral valve  regurgitation. No evidence of mitral stenosis.   6. Tricuspid valve regurgitation is mild to moderate.   7. The aortic valve is normal in structure. Aortic valve regurgitation is  not visualized. No aortic stenosis is present.   8. The inferior vena cava is normal in size with greater than 50%  respiratory variability, suggesting right atrial pressure of 3 mmHg.    Cardiac Cath 03/04/20  Left dominant coronary anatomy  Normal left main  Normal LAD that wraps around the left ventricular apex and supplies one half of the posterior interventricular groove territory.  Large first diagonal/ramus intermedius that covers the distribution typically supplied by diagonal branches.  Nondominant right coronary  Dominant circumflex coronary artery.  The circumflex coronary is tortuous including the first obtuse marginal branch.  Normal left ventricular end-diastolic pressure.  Ventriculography not performed to limit contrast exposure.       Neuro/Psych  Headaches PSYCHIATRIC DISORDERS Anxiety Depression Bipolar Disorder   Diabetic polyneuropathy  Neuromuscular disease    GI/Hepatic Neg liver ROS,GERD  Medicated,,IBS   Endo/Other  diabetes, Well Controlled, Type 2, Oral Hypoglycemic Agents  GLP-1 RA therapy- last dose last month Obesity Hyperlipidemia  Renal/GU Renal InsufficiencyRenal disease  negative genitourinary   Musculoskeletal  (+) Arthritis , Osteoarthritis,    Abdominal  (+) + obese  Peds  Hematology Eliquis therapy- last dose yesterday   Anesthesia Other Findings   Reproductive/Obstetrics                             Anesthesia Physical Anesthesia Plan  ASA: 3  Anesthesia Plan: General   Post-op Pain Management: Minimal or no pain anticipated, Precedex and Ketamine IV*   Induction: Intravenous  PONV Risk Score and Plan: 4 or greater and Treatment may vary due to age or medical condition, Midazolam and Ondansetron  Airway Management Planned: Oral ETT  Additional Equipment: None  Intra-op Plan:   Post-operative Plan: Extubation in OR  Informed Consent: I have reviewed the patients History and  Physical, chart, labs and discussed the procedure including the risks, benefits and alternatives for the proposed anesthesia with the patient or authorized representative who has indicated his/her  understanding and acceptance.     Dental advisory given  Plan Discussed with: Anesthesiologist and CRNA  Anesthesia Plan Comments: (BIS monitoring)       Anesthesia Quick Evaluation

## 2022-10-27 NOTE — Plan of Care (Signed)
  Problem: Education: Goal: Understanding of disease, treatment, and recovery process will improve Outcome: Progressing   Problem: Activity: Goal: Ability to return to baseline activity level will improve Outcome: Progressing   Problem: Cardiac: Goal: Ability to maintain adequate cardiovascular perfusion will improve Outcome: Progressing Goal: Vascular access site(s) Level 0-1 will be maintained Outcome: Progressing   Problem: Health Behavior/ Discharge Planning: Goal: Ability to safely manage health related needs after discharge Outcome: Progressing   Problem: Education: Goal: Understanding of cardiac disease, CV risk reduction, and recovery process will improve Outcome: Progressing Goal: Individualized Educational Video(s) Outcome: Progressing   Problem: Activity: Goal: Ability to tolerate increased activity will improve Outcome: Progressing   Problem: Cardiac: Goal: Ability to achieve and maintain adequate cardiovascular perfusion will improve Outcome: Progressing   Problem: Health Behavior/Discharge Planning: Goal: Ability to safely manage health-related needs after discharge will improve Outcome: Progressing   Problem: Education: Goal: Knowledge of General Education information will improve Description: Including pain rating scale, medication(s)/side effects and non-pharmacologic comfort measures Outcome: Progressing   Problem: Health Behavior/Discharge Planning: Goal: Ability to manage health-related needs will improve Outcome: Progressing   Problem: Clinical Measurements: Goal: Ability to maintain clinical measurements within normal limits will improve Outcome: Progressing Goal: Will remain free from infection Outcome: Progressing Goal: Diagnostic test results will improve Outcome: Progressing Goal: Respiratory complications will improve Outcome: Progressing Goal: Cardiovascular complication will be avoided Outcome: Progressing   Problem:  Activity: Goal: Risk for activity intolerance will decrease Outcome: Progressing   Problem: Nutrition: Goal: Adequate nutrition will be maintained Outcome: Progressing   Problem: Coping: Goal: Level of anxiety will decrease Outcome: Progressing   Problem: Elimination: Goal: Will not experience complications related to bowel motility Outcome: Progressing Goal: Will not experience complications related to urinary retention Outcome: Progressing   Problem: Pain Managment: Goal: General experience of comfort will improve Outcome: Progressing   Problem: Safety: Goal: Ability to remain free from injury will improve Outcome: Progressing   Problem: Skin Integrity: Goal: Risk for impaired skin integrity will decrease Outcome: Progressing

## 2022-10-27 NOTE — Anesthesia Procedure Notes (Signed)
Procedure Name: Intubation Date/Time: 10/27/2022 2:35 PM  Performed by: Elvin So, CRNAPre-anesthesia Checklist: Patient identified, Emergency Drugs available, Suction available and Patient being monitored Patient Re-evaluated:Patient Re-evaluated prior to induction Oxygen Delivery Method: Circle System Utilized Preoxygenation: Pre-oxygenation with 100% oxygen Induction Type: IV induction Ventilation: Mask ventilation without difficulty Laryngoscope Size: Mac and 4 Grade View: Grade II Tube type: Oral Tube size: 7.0 mm Number of attempts: 1 Airway Equipment and Method: Stylet and Oral airway Placement Confirmation: ETT inserted through vocal cords under direct vision, positive ETCO2 and breath sounds checked- equal and bilateral Secured at: 22 cm Tube secured with: Tape Dental Injury: Teeth and Oropharynx as per pre-operative assessment

## 2022-10-27 NOTE — Transfer of Care (Signed)
Immediate Anesthesia Transfer of Care Note  Patient: Paula Stout  Procedure(s) Performed: ATRIAL FIBRILLATION ABLATION  Patient Location: PACU  Anesthesia Type:General  Level of Consciousness: awake and patient cooperative  Airway & Oxygen Therapy: Patient Spontanous Breathing and Patient connected to nasal cannula oxygen  Post-op Assessment: Report given to RN, Post -op Vital signs reviewed and stable, and Patient moving all extremities  Post vital signs: Reviewed and stable  Last Vitals:  Vitals Value Taken Time  BP 147/110 10/27/22 1628  Temp    Pulse 75 10/27/22 1633  Resp 15 10/27/22 1633  SpO2 96 % 10/27/22 1633  Vitals shown include unvalidated device data.  Last Pain:  Vitals:   10/27/22 1607  TempSrc: Temporal  PainSc:          Complications: There were no known notable events for this encounter.

## 2022-10-27 NOTE — Discharge Instructions (Addendum)
Post procedure care instructions No driving for 4 days. No lifting over 5 lbs for 1 week. No vigorous or sexual activity for 1 week. You may return to work/your usual activities on 11/04/22. Keep procedure site clean & dry. If you notice increased pain, swelling, bleeding or pus, call/return!  You may shower after 24 hours, but no soaking in baths/hot tubs/pools for 1 week.   You have an appointment set up with the [facility].  Multiple studies have shown that being followed by a dedicated atrial fibrillation clinic in addition to the standard care you receive from your other physicians improves health. We believe that enrollment in the atrial fibrillation clinic will allow [place] to better care for you.   The phone number to the [facility] is [phone]. The clinic is staffed Monday through Friday from 8:30am to 5pm.  Directions: The clinic is located in the [facility], [place] the hospital at the MAIN ENTRANCE "A", use [address] to the 6th floor.  Registration desk to the right of elevators on 6th floor  If you have any trouble locating the clinic, please don't hesitate to call [phone].   Femoral Site Care This sheet gives you information about how to care for yourself after your procedure. Your health care provider may also give you more specific instructions. If you have problems or questions, contact your health care provider. What can I expect after the procedure?  After the procedure, it is common to have: Bruising that usually fades within 1-2 weeks. Tenderness at the site. Follow these instructions at home: Wound care Follow instructions from your health care provider about how to take care of your insertion site. Make sure you: Wash your hands with soap and water before you change your bandage (dressing). If soap and water are not available, use hand sanitizer. Remove your dressing as told by your health care provider.  In 24 hours Do not take baths, swim, or use a hot tub until your health care provider approves. You may shower 24-48 hours after the procedure or as told by your health care provider. Gently wash the site with plain soap and water. Pat the area dry with a clean towel. Do not rub the site. This may cause bleeding. Do not apply powder or lotion to the site. Keep the site clean and dry. Check your femoral site every day for signs of infection. Check for: Redness, swelling, or pain. Fluid or blood. Warmth. Pus or a bad smell. Activity For the first 2-3 days after your procedure, or as long as directed: Avoid climbing stairs as much as possible. Do not squat. Do not lift anything that is heavier than 10 lb (4.5 kg), or the limit that you are told, until your health care provider says that it is safe. For 5 days Rest as directed. Avoid sitting for a long time without moving. Get up to take short walks every 1-2 hours. Do not drive for 24 hours if you were given a medicine to help you relax (sedative). General instructions Take over-the-counter and prescription medicines only as told by your health care provider. Keep all follow-up visits as told by your health care provider. This is important. Contact a health care provider if you have: A fever or chills. You have redness, swelling, or pain around your insertion site. Get help right away if: The catheter insertion area swells very fast. You pass out. You suddenly start to sweat or your skin gets clammy.  The catheter insertion area is bleeding, and the bleeding does not stop when you hold steady pressure on the area. The area near or just beyond the catheter insertion site becomes pale, cool, tingly, or numb. These symptoms may represent a serious problem that is an emergency. Do not wait to see if the symptoms will go away. Get medical help right away. Call your local emergency services (911 in the [place] Do not drive yourself to the  hospital. Summary After the procedure, it is common to have bruising that usually fades within 1-2 weeks. Check your femoral site every day for signs of infection. Do not lift anything that is heavier than 10 lb (4.5 kg), or the limit that you are told, until your health care provider says that it is safe. This information is not intended to replace advice given to you by your health care provider. Make sure you discuss any questions you have with your health care provider. Document Revised: 09/05/2017 Document Reviewed: 09/05/2017 Elsevier Patient Education  2020 [employer].

## 2022-10-27 NOTE — Progress Notes (Signed)
Pt complaining of 7/10  crushing chest pain.  VSS, 2nd EKG preformed and Dr. Curt Bears made aware.Camnitz at bedside assessing pt  25 mg of fentanyl ordered and administered. Pt states that pain had decreased to 5/10. Transported to SS. VSS

## 2022-10-27 NOTE — Significant Event (Addendum)
Rapid Response Event Note   Reason for Call :  6/10 CP/SOB s/p ablation  Initial Focused Assessment:  Pt lying in bed with eyes open. She is alert and oriented, c/o 6/10 midsternal CP and SOB. She says her CP is worse with inspiration. She is tachypneic. Lungs are clear, decreased in the bases. Heart sounds WNL. Skin warm and dry.   HR-73, BP-119/90, RR-26, SpO2-95% on RA  Interventions:  EKG-Acc. JR, ST and T wave abn NTG SL 0.31m x 3>pain 6>5>4 [place]-[facility] to admit overnight Plan of Care:  Tx pt to [facility]. Dr. GoPatsey Bertholdo see pt. Continue to monitor pt closely. Call RRT if further assistance needed.   Event Summary:   MD Notified: Dr. CaShonna ChockDr. GoPatsey Bertholdcards MD to admit pt.  Call Time:2020 Arrival Time:2025 End Time:   Update: 2146 Called to bedside on 6E d/t pt becoming diaphoretic, HR-140s, 8/10 CP while in elevator en route to 6E.  On my arrival, pt had been placed on 6E15's monitor. Pt's CP was 4/10 and HR down to 70s. Pt was no longer diaphoretic. Lungs CTA, heart sounds WNL. EKG-SR with ST and T wave abn. Dr. GoPatsey Bertholdotified and he is en route to see pt.     HoDillard EssexRN

## 2022-10-28 ENCOUNTER — Other Ambulatory Visit: Payer: Self-pay | Admitting: Family Medicine

## 2022-10-28 ENCOUNTER — Encounter (HOSPITAL_COMMUNITY): Payer: Self-pay | Admitting: Cardiology

## 2022-10-28 ENCOUNTER — Other Ambulatory Visit (HOSPITAL_COMMUNITY): Payer: Self-pay

## 2022-10-28 DIAGNOSIS — F339 Major depressive disorder, recurrent, unspecified: Secondary | ICD-10-CM

## 2022-10-28 DIAGNOSIS — I480 Paroxysmal atrial fibrillation: Secondary | ICD-10-CM

## 2022-10-28 DIAGNOSIS — E1122 Type 2 diabetes mellitus with diabetic chronic kidney disease: Secondary | ICD-10-CM | POA: Diagnosis not present

## 2022-10-28 DIAGNOSIS — J45909 Unspecified asthma, uncomplicated: Secondary | ICD-10-CM | POA: Diagnosis not present

## 2022-10-28 DIAGNOSIS — N183 Chronic kidney disease, stage 3 unspecified: Secondary | ICD-10-CM | POA: Diagnosis not present

## 2022-10-28 DIAGNOSIS — I4892 Unspecified atrial flutter: Secondary | ICD-10-CM | POA: Diagnosis not present

## 2022-10-28 DIAGNOSIS — Z7901 Long term (current) use of anticoagulants: Secondary | ICD-10-CM | POA: Diagnosis not present

## 2022-10-28 MED ORDER — APIXABAN 5 MG PO TABS
5.0000 mg | ORAL_TABLET | Freq: Two times a day (BID) | ORAL | Status: DC
  Administered 2022-10-28: 5 mg via ORAL
  Filled 2022-10-28: qty 1

## 2022-10-28 MED ORDER — COLCHICINE 0.6 MG PO TABS
0.6000 mg | ORAL_TABLET | Freq: Every day | ORAL | 0 refills | Status: DC
  Filled 2022-10-28: qty 14, 14d supply, fill #0

## 2022-10-28 MED ORDER — METOPROLOL SUCCINATE ER 50 MG PO TB24
50.0000 mg | ORAL_TABLET | Freq: Every day | ORAL | Status: DC
  Administered 2022-10-28: 50 mg via ORAL
  Filled 2022-10-28: qty 1

## 2022-10-28 MED ORDER — FENTANYL CITRATE PF 50 MCG/ML IJ SOSY
25.0000 ug | PREFILLED_SYRINGE | Freq: Once | INTRAMUSCULAR | Status: AC
  Administered 2022-10-28: 25 ug via INTRAVENOUS
  Filled 2022-10-28: qty 1

## 2022-10-28 MED ORDER — APIXABAN 5 MG PO TABS: 5.0000 mg | ORAL_TABLET | Freq: Two times a day (BID) | ORAL | Status: DC

## 2022-10-28 NOTE — Discharge Summary (Addendum)
ELECTROPHYSIOLOGY PROCEDURE DISCHARGE SUMMARY    Patient ID: Paula Stout,  MRN: [id number], DOB/AGE: December 18, 1955 67 y.o.  Admit date: 10/27/2022 Discharge date: 10/28/2022  Primary Care Physician: [name], DO Primary Cardiologist: Dr. Agustin Cree Electrophysiologist: Dr. Curt Bears  Primary Discharge Diagnosis:  paroxysmal Atrial fibrillation AFlutter SVT      CHA2DS2Vasc is 3, on Eliquis  Secondary Discharge Diagnosis:  DM CKD (III) Obesity HLD  Procedures This Admission:  1.  Electrophysiology study and radiofrequency catheter ablation on by [name] CONCLUSIONS: 1. Sinus rhythm upon presentation.   2. Successful electrical isolation and anatomical encircling of all four pulmonary veins with radiofrequency current. 3. No inducible arrhythmias following ablation both on and off of dobutamine 4. No early apparent complications.  Brief HPI: Paula Stout is a 67 y.o. female with a history including above. Followed outpatient by Dr. Earlyne Iba team.  With increased AFib burden planned for re-do ablation.  Risks, benefits, and alternatives to catheter ablation of atrial fibrillation were reviewed with the patient who wished to proceed.     Hospital Course:  The patient was admitted and underwent EPS/RFCA of atrial fibrillation with details as outlined above.  Planned for same day discharge though developed RVR and CP, admitted, started on colchicine.  She was monitored on telemetry overnight which demonstrated SR, no recurrent SVT/tachycardias since last evening.  Groin sites are without complication on the day of discharge.  The patient feels well today, denies ongoing CP, no SOB, site discomfort.  She was examined by Dr. Curt Bears and considered to be stable for discharge.  Wound care and restrictions were reviewed with the patient.  The patient Paula Stout be seen back by the Afib clinic in 4 weeks and [name] in 12 weeks for post ablation follow up.    Colchicine 0.66m daily for 2 weeks   Physical Exam: Vitals:   10/27/22 2316 10/28/22 0351 10/28/22 0700 10/28/22 0938  BP: 138/68 134/62 137/72 130/73  Pulse: 82 70 67   Resp: 17 12 18   $ Temp:  97.9 F (36.6 C) 97.8 F (36.6 C)   TempSrc:  Oral Oral   SpO2: 99% 96% 99%   Weight:      Height:        GEN- The patient is well appearing, alert and oriented x 3 today.   HEENT: normocephalic, atraumatic; sclera clear, conjunctiva pink; hearing intact; oropharynx clear; neck supple  Lungs-  CTA b/l, normal work of breathing.  No wheezes, rales, rhonchi Heart- RRR, no murmurs, rubs or gallops  GI- soft, non-tender, non-distended, bowel sounds present  Extremities- no clubbing, cyanosis, or edema; b/l groins are without hematoma/bruit MS- no significant deformity or atrophy Skin- warm and dry, no rash or lesion Psych- euthymic mood, full affect Neuro- strength and sensation are intact   Labs:   Lab Results  Component Value Date   WBC 8.3 10/13/2022   HGB 12.1 10/13/2022   HCT 37.6 10/13/2022   MCV 96 10/13/2022   PLT 293 10/13/2022   No results for input(s): "NA", "K", "CL", "CO2", "BUN", "CREATININE", "CALCIUM", "PROT", "BILITOT", "ALKPHOS", "ALT", "AST", "GLUCOSE" in the last 168 hours.  Invalid input(s): "LABALBU"   Discharge Medications:  Allergies as of 10/28/2022       Reactions   Bee Venom Anaphylaxis   Onion Other (See Comments)   Respiratory Distress   Tetanus-diphtheria Toxoids Td Anaphylaxis   Sumatriptan    Passed out, nose bleed   Asa [aspirin] Nausea And Vomiting  Iodine Rash   Nsaids Nausea Only   Rash, hives and trouble breathing   Sulfa Antibiotics Rash   Vancomycin Rash        Medication List     TAKE these medications    acetaminophen 500 MG tablet Commonly known as: TYLENOL Take 1,000 mg by mouth every 6 (six) hours as needed for mild pain.   albuterol 108 (90 Base) MCG/ACT inhaler Commonly known as: VENTOLIN HFA Inhale 2 puffs  into the lungs every 4 (four) hours as needed for wheezing.   apixaban 5 MG Tabs tablet Commonly known as: ELIQUIS Take 1 tablet (5 mg total) by mouth 2 (two) times daily.   atorvastatin 80 MG tablet Commonly known as: LIPITOR Take 1 tablet (80 mg total) by mouth daily.   blood glucose meter kit and supplies One Touch Ultra  Check blood sugars once daily Dx: E11.9 What changed:  how much to take how to take this when to take this   botulinum toxin Type A 200 units injection Commonly known as: BOTOX Inject 155 units IM into multiple site in the face,neck and head once every 90 days What changed:  how much to take how to take this when to take this   buPROPion 300 MG 24 hr tablet Commonly known as: WELLBUTRIN XL Take 1 tablet (300 mg total) by mouth daily.   colchicine 0.6 MG tablet Take 1 tablet (0.6 mg total) by mouth daily for 14 days.   diltiazem 30 MG tablet Commonly known as: Cardizem Take 1 tablet every 4 hours AS NEEDED for AFIB heart rate >100 as long as top BP >100. What changed:  how much to take how to take this when to take this reasons to take this   diphenhydrAMINE 25 MG tablet Commonly known as: BENADRYL Take 25 mg by mouth daily as needed for allergies (seasonal).   EPINEPHrine 0.3 mg/0.3 mL Soaj injection Commonly known as: EPI-PEN Inject 0.3 mg into the muscle as needed for anaphylaxis.   ezetimibe 10 MG tablet Commonly known as: ZETIA TAKE 1 TABLET DAILY   fenofibrate 160 MG tablet Take 1 tablet (160 mg total) by mouth daily.   isosorbide mononitrate 30 MG 24 hr tablet Commonly known as: IMDUR TAKE ONE-HALF (1/2) TABLET DAILY   levocetirizine 5 MG tablet Commonly known as: XYZAL TAKE 1 TABLET EVERY EVENING   Lurasidone HCl 120 MG Tabs Take 1 tablet (120 mg total) by mouth at bedtime.   magnesium oxide 400 (240 Mg) MG tablet Commonly known as: MAG-OX Take 400 mg by mouth daily.   metoprolol succinate 50 MG 24 hr tablet Commonly  known as: TOPROL-XL Take 1 tablet (50 mg total) by mouth daily. Take with or immediately following a meal. What changed: when to take this   metoprolol tartrate 25 MG tablet Commonly known as: LOPRESSOR Take 1 tablet (25 mg total) by mouth every 4 (four) hours as needed (PALPITATIONS).   nitroGLYCERIN 0.4 MG SL tablet Commonly known as: NITROSTAT Place 1 tablet (0.4 mg total) under the tongue every 5 (five) minutes as needed for chest pain.   ondansetron 4 MG disintegrating tablet Commonly known as: ZOFRAN-ODT Take 1 tablet (4 mg total) by mouth every 8 (eight) hours as needed for nausea or vomiting.   OneTouch Verio test strip Generic drug: glucose blood Use daily to check blood sugar. What changed:  how much to take how to take this when to take this reasons to take this   pantoprazole 40  MG tablet Commonly known as: PROTONIX TAKE 1 TABLET DAILY   QUEtiapine 300 MG tablet Commonly known as: SEROQUEL Take 1 tablet (300 mg total) by mouth at bedtime.   topiramate 50 MG tablet Commonly known as: TOPAMAX TAKE 3 TABLETS TWICE A DAY   Trulicity 1.5 0000000 Sopn Generic drug: Dulaglutide Inject 1.5 mg as directed once a week.   Vitamin B-12 5000 MCG Tbdp Take 5,000 mcg by mouth daily.   Vitamin D3 125 MCG (5000 UT) capsule Generic drug: Cholecalciferol Take 5,000 Units by mouth daily.        Disposition: Home Discharge Instructions     Diet - low sodium heart healthy   Complete by: As directed    Increase activity slowly   Complete by: As directed         Duration of Discharge Encounter: Greater than 30 minutes including physician time.  Paula Night, PA-C 10/28/2022 9:48 AM  I have seen and examined this patient with Paula Stout.  Agree with above, note added to reflect my findings.  Is post atrial fibrillation ablation.  Post ablation, she developed chest pain.  She received nitroglycerin without improvement.  Pain was worse when she took a  deep breath and worse when she lies flat.  Patient received colchicine with improvement in her pain.  Paula Stout plan for discharge today with follow-up in clinic.  Paula Stout discharge with colchicine.  She did have an episode of SVT while in the hospital.  Paula Stout resume home metoprolol.  GEN: Well nourished, well developed, in no acute distress  HEENT: normal  Neck: no JVD, carotid bruits, or masses Cardiac: RRR; no murmurs, rubs, or gallops,no edema  Respiratory:  clear to auscultation bilaterally, normal work of breathing GI: soft, nontender, nondistended, + BS MS: no deformity or atrophy  Skin: warm and dry Neuro:  Strength and sensation are intact Psych: euthymic mood, full affect     Paula Stout M. Fredy Gladu MD 10/28/2022 1:54 PM

## 2022-10-28 NOTE — Anesthesia Postprocedure Evaluation (Signed)
Anesthesia Post Note  Patient: Paula Stout  Procedure(s) Performed: ATRIAL FIBRILLATION ABLATION     Patient location during evaluation: PACU Anesthesia Type: General Level of consciousness: awake and alert Pain management: pain level controlled Vital Signs Assessment: post-procedure vital signs reviewed and stable Respiratory status: spontaneous breathing, nonlabored ventilation, respiratory function stable and patient connected to nasal cannula oxygen Cardiovascular status: blood pressure returned to baseline and stable Postop Assessment: no apparent nausea or vomiting Anesthetic complications: no   There were no known notable events for this encounter.  Last Vitals:  Vitals:   10/28/22 0700 10/28/22 0938  BP: 137/72 130/73  Pulse: 67   Resp: 18   Temp: 36.6 C   SpO2: 99%     Last Pain:  Vitals:   10/28/22 0938  TempSrc:   PainSc: 0-No pain                 Duwayne Matters S

## 2022-10-29 LAB — LIPOPROTEIN A (LPA): Lipoprotein (a): 69.7 nmol/L — ABNORMAL HIGH (ref <75.0)

## 2022-11-01 NOTE — Telephone Encounter (Signed)
Reports resolution of diarrhea once she stopped it as advised last Friday. She has slight discomfort when up doing things, but still healing and in her post ablation week of recovery. It worsens/changes in nature/does not improve pt will reach back out to the office to discuss. She appreciates my follow up on this.

## 2022-11-19 ENCOUNTER — Other Ambulatory Visit (HOSPITAL_COMMUNITY): Payer: Self-pay

## 2022-11-19 NOTE — Telephone Encounter (Signed)
Patient now has Medicare Advantage plan- no longer eligible for Botox copay card. Submitted new Botox One Report: Paula Stout: [id number] PA Approved 10/20/22 through 11/19/23- patient's copay is $300.  Also submitted Medical Benefit PA to [facility] Medicare-awaiting response

## 2022-11-22 ENCOUNTER — Encounter: Payer: Self-pay | Admitting: Family Medicine

## 2022-11-22 ENCOUNTER — Ambulatory Visit (INDEPENDENT_AMBULATORY_CARE_PROVIDER_SITE_OTHER): Payer: Medicare (Managed Care) | Admitting: Family Medicine

## 2022-11-22 VITALS — BP 112/80 | HR 68 | Temp 97.8°F | Ht 66.0 in | Wt 221.0 lb

## 2022-11-22 DIAGNOSIS — Z23 Encounter for immunization: Secondary | ICD-10-CM | POA: Diagnosis not present

## 2022-11-22 DIAGNOSIS — E2839 Other primary ovarian failure: Secondary | ICD-10-CM

## 2022-11-22 DIAGNOSIS — E782 Mixed hyperlipidemia: Secondary | ICD-10-CM | POA: Diagnosis not present

## 2022-11-22 DIAGNOSIS — E1169 Type 2 diabetes mellitus with other specified complication: Secondary | ICD-10-CM | POA: Diagnosis not present

## 2022-11-22 DIAGNOSIS — E669 Obesity, unspecified: Secondary | ICD-10-CM | POA: Diagnosis not present

## 2022-11-22 DIAGNOSIS — K219 Gastro-esophageal reflux disease without esophagitis: Secondary | ICD-10-CM

## 2022-11-22 DIAGNOSIS — Z1231 Encounter for screening mammogram for malignant neoplasm of breast: Secondary | ICD-10-CM

## 2022-11-22 MED ORDER — PANTOPRAZOLE SODIUM 40 MG PO TBEC: 40.0000 mg | DELAYED_RELEASE_TABLET | Freq: Every day | ORAL | 3 refills | Status: DC

## 2022-11-22 NOTE — Progress Notes (Signed)
Subjective:   Chief Complaint  Patient presents with   Follow-up    Paula Stout is a 67 y.o. female here for follow-up of diabetes.   Paula Stout's self monitored glucose range is 90-100's.  Patient denies hypoglycemic reactions. She checks her glucose levels several time(s) per week Patient does not require insulin.   Medications include: Trulicity 1.5 mg/week Diet is not great.  Exercise: none  Hyperlipidemia Patient presents for mixed hyperlipidemia follow up. Currently being treated with Lipitor 80 mg/d, fenofibrate 160 mg/d and compliance with treatment thus far has been good. She denies myalgias. Diet/exercise as above.  The patient is not known to have coexisting coronary artery disease.  Past Medical History:  Diagnosis Date   A-fib [facility])    ACS (acute coronary syndrome) ([place]) 07/31/2021   Acute cystitis without hematuria 05/14/2017   Last Assessment & Plan:  Formatting of this note might be different from the original. - suprapubic tenderness with symptoms of urinary frequency and urgency and subjective fevers (no fevers recorded on any visit). Recently passed a renal stone, Paula Stout shows left hydronephrosis which is improving.  - UA negative8/24 but pt is clinically symptomatic - will repeat UA  - start pt on Microbid for 5 days   Adhesive capsulitis of left shoulder 12/16/2016   Anxiety    Asthma without status asthmaticus 02/06/2021   At risk for falls 02/04/2019   Atrial fibrillation (Paula Stout) 09/01/2014   Last Assessment & Plan:  Formatting of this note might be different from the original. A:  Chronic.  Sinus rhythm at this time.  States she was told this during admission at [facility] in the past. P:  On baby aspirin at home will continue.  Low dose metoprolol started.   Bipolar 1 disorder, depressed, moderate ([place]) 02/18/2020   Capsulitis of left shoulder 10/20/2021   Chronic anticoagulation 02/20/2020   Chronic atrial fibrillation (Paula Stout) 02/18/2020   Chronic headache  10/11/2013   Chronic interstitial cystitis 12/28/2005   Chronic migraine without aura, intractable, without status migrainosus 06/24/2017   Colon cancer screening 04/15/2016   Last Assessment & Plan:  Formatting of this note might be different from the original. Will schedule for colonoscopy   Depression, recurrent ([place]) 02/18/2020   Diabetes mellitus (Paula Stout)    Dyslipidemia 16/06/9603   Dysmetabolic syndrome X 54/05/8118   GAD (generalized anxiety disorder) 02/18/2020   GERD (gastroesophageal reflux disease) 11/05/2021   Heartburn 04/15/2016   Last Assessment & Plan:  Formatting of this note might be different from the original. Patient was counseled regarding lifestyle modification  Advised to take Prilosec 30 mins before breakfast  Will schedule for EGD   History of atrial fibrillation 06/24/2017   History of posttraumatic stress disorder (PTSD) 10/07/2016   HLD (hyperlipidemia) 09/01/2014   Last Assessment & Plan:  Formatting of this note might be different from the original. A: Chronic. P: Continue statin.   Hyperosmolarity due to secondary diabetes mellitus (Paula Stout) 02/06/2021   Hypertriglyceridemia 02/18/2020   Impaired mobility and activities of daily living 11/14/2015   Irritable bowel syndrome (IBS)    Junctional tachycardia 11/05/2021   Kidney stone 02/06/2021   Migraine without aura, not refractory 02/06/2021   Mixed hyperlipidemia 04/06/2021   Last Assessment & Plan:  Formatting of this note might be different from the original. Due for labs with PCP in November   Morbid obesity (Paula Stout) 12/16/2016   Paroxysmal supraventricular tachycardia 11/05/2021   Pneumonia, organism unspecified(486) 11/21/2002   Polyneuropathy due to type 2 diabetes  mellitus (Paula Stout) 02/06/2021   Primary hypertension 04/06/2021   Last Assessment & Plan:  Formatting of this note might be different from the original. Controlled   Refractory migraine with aura 02/06/2021   Refractory migraine without aura  [id number]   Renal colic on left side [id number]   Stage 3 chronic kidney disease (Paula Stout) 02/20/2020   Unspecified adverse effect of other drug, medicinal and biological substance(995.29) 02/17/2006   Urinary tract obstruction 02/06/2021     Related testing: Retinal exam: Done Pneumovax: Due for PCV20  Objective:  BP 112/80 (BP Location: Left Arm, Patient Position: Sitting, Cuff Size: Large)   Pulse 68   Temp 97.8 F (36.6 C) (Oral)   Ht 5\' 6"  (1.676 m)   Wt 221 lb (100.2 kg)   SpO2 99%   BMI 35.67 kg/m  General:  Well developed, well nourished, in no apparent distress Skin:  Warm, no pallor or diaphoresis on exposed surfaces.  Lungs:  CTAB, no access msc use Cardio:  RRR, no bruits, no LE edema Musculoskeletal:  Symmetrical muscle groups noted without atrophy or deformity Psych: Age appropriate judgment and insight  Assessment:   Diabetes mellitus type 2 in obese (Paula Stout) - Plan: Lipid panel, Hemoglobin A1c, Comprehensive metabolic panel  Mixed hyperlipidemia  Gastroesophageal reflux disease, unspecified whether esophagitis present - Plan: pantoprazole (PROTONIX) 40 MG tablet  Estrogen deficiency - Plan: DG Bone Density  Encounter for screening mammogram for malignant neoplasm of breast - Plan: MM DIGITAL SCREENING BILATERAL   Plan:   Chronic, stable. Cont Trulicity 1.5 mg/week until she finds suitable and covered alt. Will call ins today. Counseled on diet and exercise. Chronic, stable. Cont Lipitor 80 mg/d, fenofibrate 160 mg/d.  F/u in 6 mo. The patient voiced understanding and agreement to the plan.  [place], DO 11/22/22 2:46 PM

## 2022-11-22 NOTE — Patient Instructions (Addendum)
Call your insurance company to see what weekly injectable diabetes medications they will cover. Let me know their answer.   Keep the diet clean and stay active.  Aim to do some physical exertion for 150 minutes per week. This is typically divided into 5 days per week, 30 minutes per day. The activity should be enough to get your heart rate up. Anything is better than nothing if you have time constraints.  Give [place] 2-3 business days to get the results of your labs back.   Please call Dr. Moshe Cipro: [phone]   Let us know if you need anything.

## 2022-11-22 NOTE — Addendum Note (Signed)
Addended by: Sharon Seller B on: 11/22/2022 02:50 PM   Modules accepted: Orders

## 2022-11-23 ENCOUNTER — Telehealth: Payer: Self-pay | Admitting: Licensed Clinical Social Worker

## 2022-11-23 LAB — LIPID PANEL
Cholesterol: 136 mg/dL (ref 0–200)
HDL: 34.3 mg/dL — ABNORMAL LOW (ref >39.00)
LDL Cholesterol: 67 mg/dL (ref 0–99)
NonHDL: 101.76
Total CHOL/HDL Ratio: 4
Triglycerides: 172 mg/dL — ABNORMAL HIGH (ref 0.0–149.0)
VLDL: 34.4 mg/dL (ref 0.0–40.0)

## 2022-11-23 LAB — COMPREHENSIVE METABOLIC PANEL
ALT: 24 U/L (ref 0–35)
AST: 32 U/L (ref 0–37)
Albumin: 3.8 g/dL (ref 3.5–5.2)
Alkaline Phosphatase: 63 U/L (ref 39–117)
BUN: 17 mg/dL (ref 6–23)
CO2: 21 mEq/L (ref 19–32)
Calcium: 9.5 mg/dL (ref 8.4–10.5)
Chloride: 109 mEq/L (ref 96–112)
Creatinine, Ser: 1.92 mg/dL — ABNORMAL HIGH (ref 0.40–1.20)
GFR: 27.09 mL/min — ABNORMAL LOW (ref >60.00)
Glucose, Bld: 126 mg/dL — ABNORMAL HIGH (ref 70–99)
Potassium: 4.4 mEq/L (ref 3.5–5.1)
Sodium: 140 mEq/L (ref 135–145)
Total Bilirubin: 0.5 mg/dL (ref 0.2–1.2)
Total Protein: 6.7 g/dL (ref 6.0–8.3)

## 2022-11-23 LAB — HEMOGLOBIN A1C: Hgb A1c MFr Bld: 6.2 % (ref 4.6–6.5)

## 2022-11-23 MED ORDER — FENOFIBRATE 160 MG PO TABS: 160.0000 mg | ORAL_TABLET | Freq: Every day | ORAL | 3 refills | Status: DC

## 2022-11-23 NOTE — Patient Outreach (Signed)
  Care Coordination   11/23/2022 Name: Tora Hemker MRN: [id number] DOB: August 05, 1956   Care Coordination Outreach Attempts:  An unsuccessful telephone outreach was attempted today to offer the patient information about available care coordination services as a benefit of their health plan.   Follow Up Plan:  Additional outreach attempts will be made to offer the patient care coordination information and services.   Encounter Outcome:  No Answer   Care Coordination Interventions:  No, not indicated    Casimer Lanius, [place] [phone]

## 2022-11-23 NOTE — Progress Notes (Unsigned)
Virtual Visit via Video Note  Consent was obtained for video visit:  Yes.   Answered questions that patient had about telehealth interaction:  Yes.   I discussed the limitations, risks, security and privacy concerns of performing an evaluation and management service by telemedicine. I also discussed with the patient that there may be a patient responsible charge related to this service. The patient expressed understanding and agreed to proceed.  Pt location: Home Physician Location: office Name of referring provider:  [name]* I connected with Paula Stout at patients initiation/request on 11/24/2022 at  1:50 PM EDT by video enabled telemedicine application and verified that I am speaking with the correct person using two identifiers. Pt MRN:  [id number] Pt DOB:  1956/08/03 Video Participants:  Paula Stout    Assessment/Plan:   Migraine with and without aura, without status migrainosus, not intractable, significantly improved (over 50% reduction) on Botox   Migraine prevention: Botox Migraine rescue:  Zavzpret NS Limit use of pain relievers to no more than 2 days out of week to prevent risk of rebound or  medication-overuse headache. Caffeine cessation/increase water intake/exercise Keep headache diary        Subjective:  Paula Stout is a 67 year old left-handed female with paroxysmal atrial fibrillation, PSVT s/p ablation (now with bradycardia and hypotension due to medications), DM II, depression, anxiety who follows up for migraines.  UPDATE: Status post 2 rounds of Botox.  Significant improvement Intensity:  moderate Duration:  several hours with Excedrin Migraine (didn't think about taking the Zavzpret because she thought it was a tension type headache.  She now recognizes sign for migraine) Frequency:  2-3 in last 2 months.   Rescue protocol: Zavzpret NS (has not taken) Current NSAIDS/analgesics:  none Current triptans:  none Current  ergotamine:  none Current anti-emetic:  Zofran ODT 4mg  Current muscle relaxants:  none Current Antihypertensive medications:  atenolol, lisinopril, Imdur Current Antidepressant medications:  bupropion XL 300mg  Current Anticonvulsant medications:  topiramate 150mg  daily Current anti-CGRP:  none Current Vitamins/Herbal/Supplements:  D Current Antihistamines/Decongestants:  Benadryl, Xyzal Other therapy:  none Hormone/birth control:  none Other medications:  lurasidone, quetiapine 300mg  QHS, Eliquis     Caffeine:  1 Venti size cup of coffee daily.  No soda Diet:  four 8 oz glasses of water daily.  No soda.  Skips meals (lunch) Exercise:  no Depression:  stable; Anxiety:  stable Other pain:  no Sleep hygiene:  Sleeps well.  Takes Seroquel.  Daytime somnolence.  Tested negative for sleep apnea.  HISTORY: Onset:  about 1978 Location:  primarily unilateral (either side) posterior and into neck Quality:  stabbing Intensity:  Severe Aura:  phantosmia (usually smells burnt toast or something else cooking) Prodrome:  absent Postdrome:  Associated symptoms:  nausea, vomiting, photophobia, phonophobia, osmophobia, blurred.  She denies associated unilateral numbness or weakness. Duration:  several hours to 2 days Frequency:  1 to 2 a week. (Has 15 headache days a month) Frequency of abortive medication: Takes Excedrin about 4-5 days a week Triggers:  weather changes, certain smells (perfumes) Relieving factors:  ice pack Activity:  aggravates (cannot function about 2-3 days a month) Normally wakes up with them   Prior imaging: 10/15/2021 CT HEAD WO:  No acute intracranial abnormality. 12/02/2020 MRI BRAIN W WO:  This is a normal age-appropriate MRI of the brain with and without contrast. 04/22/2020 MRI BRAIN WO:  Normal MRI brain (without). No acute findings.   Past NSAIDS/analgesics:  Fioricet, tramadol, ASA (allergy)  Past abortive triptans:  rizatriptan, sumatriptan (adverse reaction),  frovatriptan Past abortive ergotamine:  none Past muscle relaxants:  tizanidine Past anti-emetic:  none Past antihypertensive medications:  propranolol, metoprolol, diltiazem Past antidepressant medications:  amitriptyline Past anticonvulsant medications:  Depakote Past anti-CGRP:  Emgality, Ubrelvy 100mg  Past vitamins/Herbal/Supplements:  none Past antihistamines/decongestants:  none Other past therapies:  Botox (only had one round because neurologist left)  Family history of headache:  daughter (migraines)  Past Medical History: Past Medical History:  Diagnosis Date   A-fib [facility])    ACS (acute coronary syndrome) ([place]) 07/31/2021   Acute cystitis without hematuria 05/14/2017   Last Assessment & Plan:  Formatting of this note might be different from the original. - suprapubic tenderness with symptoms of urinary frequency and urgency and subjective fevers (no fevers recorded on any visit). Recently passed a renal stone, US shows left hydronephrosis which is improving.  - UA negative8/24 but pt is clinically symptomatic - will repeat UA  - start pt on Microbid for 5 days   Adhesive capsulitis of left shoulder 12/16/2016   Anxiety    Asthma without status asthmaticus 02/06/2021   At risk for falls 02/04/2019   Atrial fibrillation ([place]) 09/01/2014   Last Assessment & Plan:  Formatting of this note might be different from the original. A:  Chronic.  Sinus rhythm at this time.  States she was told this during admission at [facility] in the past. P:  On baby aspirin at home will continue.  Low dose metoprolol started.   Bipolar 1 disorder, depressed, moderate ([place]) 02/18/2020   Capsulitis of left shoulder 10/20/2021   Chronic anticoagulation 02/20/2020   Chronic atrial fibrillation ([place]) 02/18/2020   Chronic headache 10/11/2013   Chronic interstitial cystitis 12/28/2005   Chronic migraine without aura, intractable, without status migrainosus 06/24/2017   Colon cancer screening 04/15/2016    Last Assessment & Plan:  Formatting of this note might be different from the original. Will schedule for colonoscopy   Depression, recurrent ([place]) 02/18/2020   Diabetes mellitus ([place])    Dyslipidemia [id number]   Dysmetabolic syndrome X 0000000   GAD (generalized anxiety disorder) 02/18/2020   GERD (gastroesophageal reflux disease) 11/05/2021   Heartburn 04/15/2016   Last Assessment & Plan:  Formatting of this note might be different from the original. Patient was counseled regarding lifestyle modification  Advised to take Prilosec 30 mins before breakfast  Will schedule for EGD   History of atrial fibrillation 06/24/2017   History of posttraumatic stress disorder (PTSD) 10/07/2016   HLD (hyperlipidemia) 09/01/2014   Last Assessment & Plan:  Formatting of this note might be different from the original. A: Chronic. P: Continue statin.   Hyperosmolarity due to secondary diabetes mellitus ([place]) 02/06/2021   Hypertriglyceridemia 02/18/2020   Impaired mobility and activities of daily living 11/14/2015   Irritable bowel syndrome (IBS)    Junctional tachycardia 11/05/2021   Kidney stone 02/06/2021   Migraine without aura, not refractory 02/06/2021   Mixed hyperlipidemia 04/06/2021   Last Assessment & Plan:  Formatting of this note might be different from the original. Due for labs with PCP in November   Morbid obesity ([place]) 12/16/2016   Paroxysmal supraventricular tachycardia 11/05/2021   Pneumonia, organism unspecified(486) 11/21/2002   Polyneuropathy due to type 2 diabetes mellitus ([place]) 02/06/2021   Primary hypertension 04/06/2021   Last Assessment & Plan:  Formatting of this note might be different from the original. Controlled   Refractory migraine with aura 02/06/2021   Refractory migraine  without aura [id number]   Renal colic on left side [id number]   Stage 3 chronic kidney disease ([place]) 02/20/2020   Unspecified adverse effect of other drug, medicinal and biological  substance(995.29) 02/17/2006   Urinary tract obstruction 02/06/2021    Medications: Outpatient Encounter Medications as of 11/24/2022  Medication Sig Note   acetaminophen (TYLENOL) 500 MG tablet Take 1,000 mg by mouth every 6 (six) hours as needed for mild pain.    albuterol (VENTOLIN HFA) 108 (90 Base) MCG/ACT inhaler Inhale 2 puffs into the lungs every 4 (four) hours as needed for wheezing.    apixaban (ELIQUIS) 5 MG TABS tablet Take 1 tablet (5 mg total) by mouth 2 (two) times daily.    atorvastatin (LIPITOR) 80 MG tablet Take 1 tablet (80 mg total) by mouth daily.    blood glucose meter kit and supplies One Touch Ultra  Check 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 One Touch Ultra  Check blood sugars once daily Dx: E11.9)    botulinum toxin Type A (BOTOX) 200 units injection Inject 155 units IM into multiple site in the face,neck and head once every 90 days (Patient taking differently: Inject 200 Units into the muscle every 3 (three) months. Inject 155 units IM into multiple site in the face,neck and head once every 90 days)    buPROPion (WELLBUTRIN XL) 300 MG 24 hr tablet TAKE 1 TABLET DAILY    Cholecalciferol (VITAMIN D3) 125 MCG (5000 UT) capsule Take 5,000 Units by mouth daily.    Cyanocobalamin (VITAMIN B-12) 5000 MCG TBDP Take 5,000 mcg by mouth daily.    diltiazem (CARDIZEM) 30 MG tablet Take 1 tablet every 4 hours AS NEEDED for AFIB heart rate >100 as long as top BP >100.    diphenhydrAMINE (BENADRYL) 25 MG tablet Take 25 mg by mouth daily as needed for allergies (seasonal). 05/14/2022: As needed   EPINEPHrine 0.3 mg/0.3 mL IJ SOAJ injection Inject 0.3 mg into the muscle as needed for anaphylaxis.    ezetimibe (ZETIA) 10 MG tablet TAKE 1 TABLET DAILY    fenofibrate 160 MG tablet Take 1 tablet (160 mg total) by mouth daily.    levocetirizine (XYZAL) 5 MG tablet TAKE 1 TABLET EVERY EVENING (Patient taking differently:  Take 5 mg by mouth every evening.)    Lurasidone HCl 120 MG TABS Take 1 tablet (120 mg total) by mouth at bedtime.    magnesium oxide (MAG-OX) 400 (240 Mg) MG tablet Take 400 mg by mouth daily.    metoprolol succinate (TOPROL-XL) 50 MG 24 hr tablet Take 1 tablet (50 mg total) by mouth daily. Take with or immediately following a meal. (Patient taking differently: Take 50 mg by mouth 2 (two) times daily. Take with or immediately following a meal.)    metoprolol tartrate (LOPRESSOR) 25 MG tablet Take 1 tablet (25 mg total) by mouth every 4 (four) hours as needed (PALPITATIONS).    nitroGLYCERIN (NITROSTAT) 0.4 MG SL tablet Place 1 tablet (0.4 mg total) under the tongue every 5 (five) minutes as needed for chest pain.    ONETOUCH VERIO test strip USE DAILY TO CHECK BLOOD SUGAR    pantoprazole (PROTONIX) 40 MG tablet Take 1 tablet (40 mg total) by mouth daily.    QUEtiapine (SEROQUEL) 300 MG tablet Take 1 tablet (300 mg total) by mouth at bedtime.    topiramate (TOPAMAX) 50 MG tablet TAKE 3 TABLETS TWICE A DAY (Patient taking differently:  Take 150 mg by mouth 2 (two) times daily.)    TRULICITY 1.5 0000000 SOPN Inject 1.5 mg as directed once a week. 10/25/2022: ON HOLD for procedure 10/27/22   [DISCONTINUED] fenofibrate 160 MG tablet Take 1 tablet (160 mg total) by mouth daily.    No facility-administered encounter medications on file as of 11/24/2022.    Allergies: Allergies  Allergen Reactions   Bee Venom Anaphylaxis   Onion Other (See Comments)    Respiratory Distress   Tetanus-Diphtheria Toxoids Td Anaphylaxis   Sumatriptan     Passed out, nose bleed    Asa [Aspirin] Nausea And Vomiting   Iodine Rash   Nsaids Nausea Only    Rash, hives and trouble breathing   Sulfa Antibiotics Rash   Vancomycin Rash    Family History: Family History  Adopted: Yes  Problem Relation Age of Onset   Migraines Daughter    Bipolar disorder Daughter    Migraines Daughter    Migraines Son    Bipolar  disorder Son     Observations/Objective:   No acute distress.  Alert and oriented.  Speech fluent and not dysarthric.  Language intact.     Follow Up Instructions:    -I discussed the assessment and treatment plan with the patient. The patient was provided an opportunity to ask questions and all were answered. The patient agreed with the plan and demonstrated an understanding of the instructions.   The patient was advised to call back or seek an in-person evaluation if the symptoms worsen or if the condition fails to improve as anticipated.   Dudley Major, DO    CC: Riki Sheer, DO

## 2022-11-24 ENCOUNTER — Ambulatory Visit (HOSPITAL_COMMUNITY)
Admission: RE | Admit: 2022-11-24 | Discharge: 2022-11-24 | Disposition: A | Discharge status: Home / Self Care | Payer: Medicare (Managed Care) | Source: Ambulatory Visit | Attending: Physician Assistant | Admitting: Physician Assistant

## 2022-11-24 ENCOUNTER — Encounter: Payer: Self-pay | Admitting: Neurology

## 2022-11-24 ENCOUNTER — Encounter (HOSPITAL_COMMUNITY): Payer: Self-pay | Admitting: Physician Assistant

## 2022-11-24 ENCOUNTER — Telehealth: Payer: Medicare (Managed Care) | Admitting: Neurology

## 2022-11-24 VITALS — BP 110/78 | HR 61 | Ht 66.0 in | Wt 220.6 lb

## 2022-11-24 DIAGNOSIS — I471 Supraventricular tachycardia, unspecified: Secondary | ICD-10-CM | POA: Insufficient documentation

## 2022-11-24 DIAGNOSIS — I480 Paroxysmal atrial fibrillation: Secondary | ICD-10-CM

## 2022-11-24 DIAGNOSIS — G43109 Migraine with aura, not intractable, without status migrainosus: Secondary | ICD-10-CM

## 2022-11-24 DIAGNOSIS — Z79899 Other long term (current) drug therapy: Secondary | ICD-10-CM | POA: Diagnosis not present

## 2022-11-24 DIAGNOSIS — I10 Essential (primary) hypertension: Secondary | ICD-10-CM | POA: Insufficient documentation

## 2022-11-24 DIAGNOSIS — Z7951 Long term (current) use of inhaled steroids: Secondary | ICD-10-CM | POA: Diagnosis not present

## 2022-11-24 DIAGNOSIS — D6869 Other thrombophilia: Secondary | ICD-10-CM | POA: Insufficient documentation

## 2022-11-24 DIAGNOSIS — Z7901 Long term (current) use of anticoagulants: Secondary | ICD-10-CM | POA: Diagnosis not present

## 2022-11-24 NOTE — Progress Notes (Signed)
Primary Care Physician: [name], DO Referring Physician: Dr. Altha Harm [name] is a 67 y.o. female with a h/o of DM, HLD, CKD (III), SVT, AFib (has hx of a CTI and PVI ablation Jan 2020 in [place]), obesity, Bipolar d/o, migraine HAs, HTN. June 2021 went to [facility] with palpitations and chest pain.  She was found to be in sinus rhythm on presentation.  Catheterization showed no evidence of coronary artery disease. Long standing h/o of atypical chest pain and shortness of breath.  She was seen in the ER in [place] in March and had a cardioversion for SVT.   She saw [name] in April and c/o palpitations. A monitor was in place. [name] stated in her note if monitor showed SVT and no afib then Dr. Curt Bears would consider ablation.  It  did not show  any  afib, only very short runs of SVT.   She wanted to  discuses monitor further,  referred by Dr. Curt Bears office after she requested an appointment with him to discuss. She had more rapid heart rate July 15th, started to the ER but it did slow and return to SR so she turned around. In SR she is very slow in the 40's with a first degree block so unable to uptitrate her BB. She is on flecainide and it seems to be keeping afib in check.   F/u Afib clinic, 07/15/22. She is now in the afib clinic for f/u of ED visit for tachycardia with associated chest pain. EKG suggested junctional tachycardia at 135 bpm. She converted with adenosine. She is in SR today. She has short runs of this frequently maybe 2-3 times at home. She saw Dr. Curt Bears 11/3 and she is pending an ablation 10/27/22. She is leaving in a week to go to [place] for the holidays and will return 09/09/22. She reports that she was in [place] in the recent past and went to ED for tachycardia, she did not convert with 2 doses of adenosine and had to be cardioverted. Her flecainide  was topped at that time.  Her lopressor was doubled in ED now on Metoprolol Succinate 50 mg BID from  daily.   Follow up in the AF clinic 11/24/22. Patient is s/p afib ablation with [name] on 10/27/22. Patient reports that she has done well from an afib standpoint since the ablation. However, she has continued to have highly symptomatic SVT. She had two episodes of SVT prior to discharge from the hospital. She also had a witnessed syncopal episode with her SVT. She did not hit her head, family member caught her and lowered her to the ground. Lasted about 1 minute. Her SVT episodes can last up to 15 minutes at a time. She denies chest pain, swallowing pain, or groin issues.     Today, she denies symptoms of chest pain, shortness of breath, orthopnea, PND, lower extremity edema, or neurologic sequela. The patient is tolerating medications without difficulties and is otherwise without complaint today.   Past Medical History:  Diagnosis Date   A-fib [facility])    ACS (acute coronary syndrome) ([place]) 07/31/2021   Acute cystitis without hematuria 05/14/2017   Last Assessment & Plan:  Formatting of this note might be different from the original. - suprapubic tenderness with symptoms of urinary frequency and urgency and subjective fevers (no fevers recorded on any visit). Recently passed a renal stone, US shows left hydronephrosis which is improving.  - UA negative8/24 but pt is  clinically symptomatic - will repeat UA  - start pt on Microbid for 5 days   Adhesive capsulitis of left shoulder 12/16/2016   Anxiety    Asthma without status asthmaticus 02/06/2021   At risk for falls 02/04/2019   Atrial fibrillation ([place]) 09/01/2014   Last Assessment & Plan:  Formatting of this note might be different from the original. A:  Chronic.  Sinus rhythm at this time.  States she was told this during admission at [facility] in the past. P:  On baby aspirin at home will continue.  Low dose metoprolol started.   Bipolar 1 disorder, depressed, moderate ([place]) 02/18/2020   Capsulitis of left shoulder 10/20/2021   Chronic  anticoagulation 02/20/2020   Chronic atrial fibrillation ([place]) 02/18/2020   Chronic headache 10/11/2013   Chronic interstitial cystitis 12/28/2005   Chronic migraine without aura, intractable, without status migrainosus 06/24/2017   Colon cancer screening 04/15/2016   Last Assessment & Plan:  Formatting of this note might be different from the original. Will schedule for colonoscopy   Depression, recurrent ([place]) 02/18/2020   Diabetes mellitus ([place])    Dyslipidemia [id number]   Dysmetabolic syndrome X 0000000   GAD (generalized anxiety disorder) 02/18/2020   GERD (gastroesophageal reflux disease) 11/05/2021   Heartburn 04/15/2016   Last Assessment & Plan:  Formatting of this note might be different from the original. Patient was counseled regarding lifestyle modification  Advised to take Prilosec 30 mins before breakfast  Will schedule for EGD   History of atrial fibrillation 06/24/2017   History of posttraumatic stress disorder (PTSD) 10/07/2016   HLD (hyperlipidemia) 09/01/2014   Last Assessment & Plan:  Formatting of this note might be different from the original. A: Chronic. P: Continue statin.   Hyperosmolarity due to secondary diabetes mellitus ([place]) 02/06/2021   Hypertriglyceridemia 02/18/2020   Impaired mobility and activities of daily living 11/14/2015   Irritable bowel syndrome (IBS)    Junctional tachycardia 11/05/2021   Kidney stone 02/06/2021   Migraine without aura, not refractory 02/06/2021   Mixed hyperlipidemia 04/06/2021   Last Assessment & Plan:  Formatting of this note might be different from the original. Due for labs with PCP in November   Morbid obesity ([place]) 12/16/2016   Paroxysmal supraventricular tachycardia 11/05/2021   Pneumonia, organism unspecified(486) 11/21/2002   Polyneuropathy due to type 2 diabetes mellitus ([place]) 02/06/2021   Primary hypertension 04/06/2021   Last Assessment & Plan:  Formatting of this note might be different from the original.  Controlled   Refractory migraine with aura 02/06/2021   Refractory migraine without aura [id number]   Renal colic on left side [id number]   Stage 3 chronic kidney disease ([place]) 02/20/2020   Unspecified adverse effect of other drug, medicinal and biological substance(995.29) 02/17/2006   Urinary tract obstruction 02/06/2021   Past Surgical History:  Procedure Laterality Date   ABDOMINAL HYSTERECTOMY     Fibroids   ATRIAL FIBRILLATION ABLATION     ATRIAL FIBRILLATION ABLATION N/A 10/27/2022   Procedure: ATRIAL FIBRILLATION ABLATION;  Surgeon: Constance Haw, MD;  Location: [place] [facility];  Service: Cardiovascular;  Laterality: N/A;   CARDIAC CATHETERIZATION     CARDIOVERSION     CESAREAN SECTION     KNEE SURGERY     LEFT HEART CATH AND CORONARY ANGIOGRAPHY N/A 03/04/2020   Procedure: LEFT HEART CATH AND CORONARY ANGIOGRAPHY;  Surgeon: Belva Crome, MD;  Location: [place] [facility];  Service: Cardiovascular;  Laterality: N/A;   ROTATOR CUFF  REPAIR      Current Outpatient Medications  Medication Sig Dispense Refill   acetaminophen (TYLENOL) 500 MG tablet Take 1,000 mg by mouth every 6 (six) hours as needed for mild pain.     albuterol (VENTOLIN HFA) 108 (90 Base) MCG/ACT inhaler Inhale 2 puffs into the lungs every 4 (four) hours as needed for wheezing. 1 each 2   apixaban (ELIQUIS) 5 MG TABS tablet Take 1 tablet (5 mg total) by mouth 2 (two) times daily. 180 tablet 1   atorvastatin (LIPITOR) 80 MG tablet Take 1 tablet (80 mg total) by mouth daily. 90 tablet 3   blood glucose meter kit and supplies One Touch Ultra  Check 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 One Touch Ultra  Check blood sugars once daily Dx: E11.9) 1 each 0   botulinum toxin Type A (BOTOX) 200 units injection Inject 155 units IM into multiple site in the face,neck and head once every 90 days (Patient taking differently: Inject 200 Units into  the muscle every 3 (three) months. Inject 155 units IM into multiple site in the face,neck and head once every 90 days) 1 each 4   buPROPion (WELLBUTRIN XL) 300 MG 24 hr tablet TAKE 1 TABLET DAILY 90 tablet 3   Cholecalciferol (VITAMIN D3) 125 MCG (5000 UT) capsule Take 5,000 Units by mouth daily.     Cyanocobalamin (VITAMIN B-12) 5000 MCG TBDP Take 5,000 mcg by mouth daily.     diltiazem (CARDIZEM) 30 MG tablet Take 1 tablet every 4 hours AS NEEDED for AFIB heart rate >100 as long as top BP >100. 30 tablet 1   diphenhydrAMINE (BENADRYL) 25 MG tablet Take 25 mg by mouth daily as needed for allergies (seasonal).     EPINEPHrine 0.3 mg/0.3 mL IJ SOAJ injection Inject 0.3 mg into the muscle as needed for anaphylaxis. 1 each 1   ezetimibe (ZETIA) 10 MG tablet TAKE 1 TABLET DAILY 90 tablet 3   fenofibrate 160 MG tablet Take 1 tablet (160 mg total) by mouth daily. 90 tablet 3   levocetirizine (XYZAL) 5 MG tablet TAKE 1 TABLET EVERY EVENING (Patient taking differently: Take 5 mg by mouth every evening.) 90 tablet 3   Lurasidone HCl 120 MG TABS Take 1 tablet (120 mg total) by mouth at bedtime. 90 tablet 3   magnesium oxide (MAG-OX) 400 (240 Mg) MG tablet Take 400 mg by mouth daily.     metoprolol succinate (TOPROL-XL) 50 MG 24 hr tablet Take 1 tablet (50 mg total) by mouth daily. Take with or immediately following a meal. (Patient taking differently: Take 50 mg by mouth 2 (two) times daily. Take with or immediately following a meal.) 90 tablet 3   metoprolol tartrate (LOPRESSOR) 25 MG tablet Take 1 tablet (25 mg total) by mouth every 4 (four) hours as needed (PALPITATIONS). 90 tablet 3   nitroGLYCERIN (NITROSTAT) 0.4 MG SL tablet Place 1 tablet (0.4 mg total) under the tongue every 5 (five) minutes as needed for chest pain. 25 tablet 11   ONETOUCH VERIO test strip USE DAILY TO CHECK BLOOD SUGAR 100 strip 3   pantoprazole (PROTONIX) 40 MG tablet Take 1 tablet (40 mg total) by mouth daily. 90 tablet 3    QUEtiapine (SEROQUEL) 300 MG tablet Take 1 tablet (300 mg total) by mouth at bedtime. 90 tablet 3   topiramate (TOPAMAX) 50 MG tablet TAKE 3 TABLETS TWICE A DAY (Patient taking differently:  Take 150 mg by mouth 2 (two) times daily.) [id number] tablet 11   TRULICITY 1.5 0000000 SOPN Inject 1.5 mg as directed once a week. 6 mL 3   No current facility-administered medications for this encounter.    Allergies  Allergen Reactions   Bee Venom Anaphylaxis   Onion Other (See Comments)    Respiratory Distress   Tetanus-Diphtheria Toxoids Td Anaphylaxis   Sumatriptan     Passed out, nose bleed    Asa [Aspirin] Nausea And Vomiting   Iodine Rash   Nsaids Nausea Only    Rash, hives and trouble breathing   Sulfa Antibiotics Rash   Vancomycin Rash    Social History   Socioeconomic History   Marital status: Widowed    Spouse name: Not on file   Number of children: 6   Years of education: Not on file   Highest education level: Not on file  Occupational History   Not on file  Tobacco Use   Smoking status: Never   Smokeless tobacco: Never   Tobacco comments:    Never smoke 07/15/22  Vaping Use   Vaping Use: Never used  Substance and Sexual Activity   Alcohol use: Never   Drug use: Never   Sexual activity: Not on file  Other Topics Concern   Not on file  Social History Narrative   Not on file   Social Determinants of Health   Financial Resource Strain: Not on file  Food Insecurity: Patient Declined (10/27/2022)   Hunger Vital Sign    Worried About Running Out of Food in the Last Year: Patient declined    [place] in the Last Year: Patient declined  Transportation Needs: No Transportation Needs (10/27/2022)   PRAPARE - Hydrologist (Medical): No    Lack of Transportation (Non-Medical): No  Physical Activity: Not on file  Stress: Not on file  Social Connections: Not on file  Intimate Partner Violence: Not At Risk (10/27/2022)   Humiliation, Afraid,  Rape, and Kick questionnaire    Fear of Current or Ex-Partner: No    Emotionally Abused: No    Physically Abused: No    Sexually Abused: No    Family History  Adopted: Yes  Problem Relation Age of Onset   Migraines Daughter    Bipolar disorder Daughter    Migraines Daughter    Migraines Son    Bipolar disorder Son     ROS- All systems are reviewed and negative except as per the HPI above  Physical Exam: Vitals:   11/24/22 1004  BP: 110/78  Pulse: 61  Weight: 100.1 kg  Height: 5\' 6"  (1.676 m)    Wt Readings from Last 3 Encounters:  11/24/22 100.1 kg  11/22/22 100.2 kg  10/27/22 99.3 kg    Labs: Lab Results  Component Value Date   NA 140 11/22/2022   K 4.4 11/22/2022   CL 109 11/22/2022   CO2 21 11/22/2022   GLUCOSE 126 (H) 11/22/2022   BUN 17 11/22/2022   CREATININE 1.92 (H) 11/22/2022   CALCIUM 9.5 11/22/2022   MG 1.7 07/08/2022   No results found for: "INR" Lab Results  Component Value Date   CHOL 136 11/22/2022   HDL 34.30 (L) 11/22/2022   LDLCALC 67 11/22/2022   TRIG 172.0 (H) 11/22/2022    GEN- The patient is a well appearing female, alert and oriented x 3 today.   HEENT-head normocephalic, atraumatic, sclera clear, conjunctiva pink, hearing intact,  trachea midline. Lungs- Clear to ausculation bilaterally, normal work of breathing Heart- Regular rate and rhythm, no murmurs, rubs or gallops  GI- soft, NT, ND, + BS Extremities- no clubbing, cyanosis, or edema MS- no significant deformity or atrophy Skin- no rash or lesion Psych- euthymic mood, full affect Neuro- strength and sensation are intact   EKG today demonstrates Vent. rate 61 BPM PR interval 148 ms QRS duration 100 ms QT/QTcB 428/430 ms    CHA2DS2-VASc Score = 3  The patient's score is based upon: CHF History: 0 HTN History: 0 Diabetes History: 1 Stroke History: 0 Vascular Disease History: 0 Age Score: 1 Gender Score: 1       ASSESSMENT AND PLAN: 1. Paroxysmal Atrial  Fibrillation (ICD10:  I48.0) The patient's CHA2DS2-VASc score is 3, indicating a 3.2% annual risk of stroke.   S/p afib and flutter ablation 2020 in [place]. S/p repeat ablation 10/27/22. Previously on flecainide Patient in [place] today.  Continue Toprol 50 mg BID  Continue diltiazem 30 mg PRN q 4 hours for heart racing. Continue Eliquis 5 mg BID with no missed doses for 3 months post ablation.   2. Secondary Hypercoagulable State (ICD10:  D68.69) The patient is at significant risk for stroke/thromboembolism based upon her CHA2DS2-VASc Score of 3.  Continue Apixaban (Eliquis).   3. SVT Not induced during EP study.  Patient having highly symptomatic episodes. They resolve before she can take any PRN medication.  She plans to discuss with her PCP if she can change quetiapine to a different medication to avoid interactions with AADs.  Will have her follow up closely with EP office to discuss options.     Follow up with [name] next week and then with [name] as scheduled.    [facility] [address] [place], [place] [address] [phone]

## 2022-11-30 ENCOUNTER — Telehealth: Payer: Self-pay | Admitting: Licensed Clinical Social Worker

## 2022-11-30 NOTE — Patient Outreach (Signed)
  Care Coordination   11/30/2022 Name: Angelyca Otting MRN: [id number] DOB: 1956-04-29   Care Coordination Outreach Attempts:  A second unsuccessful outreach was attempted today to offer the patient with information about available care coordination services as a benefit of their health plan.     Follow Up Plan:  Additional outreach attempts will be made to offer the patient care coordination information and services.   Encounter Outcome:  No Answer   Care Coordination Interventions:  No, not indicated    Casimer Lanius, [place] [phone]

## 2022-12-01 ENCOUNTER — Encounter: Payer: Self-pay | Admitting: Family Medicine

## 2022-12-01 ENCOUNTER — Encounter: Payer: Self-pay | Admitting: Neurology

## 2022-12-01 ENCOUNTER — Other Ambulatory Visit: Payer: Self-pay | Admitting: Family Medicine

## 2022-12-01 DIAGNOSIS — I129 Hypertensive chronic kidney disease with stage 1 through stage 4 chronic kidney disease, or unspecified chronic kidney disease: Secondary | ICD-10-CM | POA: Diagnosis not present

## 2022-12-01 DIAGNOSIS — N183 Chronic kidney disease, stage 3 unspecified: Secondary | ICD-10-CM | POA: Diagnosis not present

## 2022-12-01 DIAGNOSIS — R809 Proteinuria, unspecified: Secondary | ICD-10-CM | POA: Diagnosis not present

## 2022-12-02 ENCOUNTER — Other Ambulatory Visit: Payer: Self-pay | Admitting: Neurology

## 2022-12-02 MED ORDER — ZAVZPRET 10 MG/ACT NA SOLN: 10.0000 mg | Freq: Every day | NASAL | 11 refills | Status: DC | PRN

## 2022-12-02 NOTE — Progress Notes (Signed)
Cardiology Office Note Date:  12/03/2022  Patient ID:  Paula Stout, Paula Stout 02/04/1956, MRN [id number] PCP:  [name], DO  Cardiologist:  Jenne Campus, MD Electrophysiologist: Constance Haw, MD    Chief Complaint: SVT  History of Present Illness: Paula Stout is a 67 y.o. female with PMH notable for afib s/p CTI and PVI ablation 09/2018 in [place], repeat 10/2022, SVT, T2DM, CKD stage 3, HTN; seen today for Paula [name], MD for acute visit due to symptomatic SVT.    She has long history of SVT and afib. S/p repeat AF ablation 10/2022 with Dr. Curt Bears. During the procedure, SVT was unable to be induced. She was seen in AF clinic for routine post-ablation appointment, and she c/o having increased SVT episodes including one with syncopal event. She was previously on flecainide but this was stopped d/t ineffectiveness at controlling SVT and not able to tolerate BB at the time.   Her symptomatic bradycardia has historically limited up-titration of medications.  During her SVT episodes, she first feels chest pain similar to a "punch in the chest". During the syncopal episode she noted at the AF clinic, she also felt palpitations and lightheaded. She was emptying the dishwasher at the time. Her daughter assisted her to the floor. Was out 2-3 minutes. When she came to, noticed her HR was 140s. She wears an apple watch, but must hit the button to record EKG. She did not record EKG with this episode. Her HR lowered to 60s a couple minutes later, so she did not go to ER for further eval.  She also had an episode last night in the middle of the night. Woke up from sleep to "punch in chest" feeling, lasted for about 20 minutes with HR ranging from 90s-100s. Did not feel dizzy. Her BP was 110/80 so did not take a PRN medication. Did not record rhythm on watch.  She is diligently taking eliquis BID, no bleeding concerns.   She has morning headaches, previous sleep study's  inconclusive for OSA. She also has migraines.   She is traveling in 2 days to [place] state to assist with childcare for granddaughter. She Paula be in [place] until June 2024.   AAD History: Afib ablation, 09/2018 done in [place] by Dr. Arvil Chaco ablation, 10/2022 by Dr. Curt Bears -- unable to induce SVT  Past Medical History:  Diagnosis Date   A-fib [facility])    ACS (acute coronary syndrome) ([place]) 07/31/2021   Acute cystitis without hematuria 05/14/2017   Last Assessment & Plan:  Formatting of this note might be different from the original. - suprapubic tenderness with symptoms of urinary frequency and urgency and subjective fevers (no fevers recorded on any visit). Recently passed a renal stone, US shows left hydronephrosis which is improving.  - UA negative8/24 but pt is clinically symptomatic - Paula repeat UA  - start pt on Microbid for 5 days   Adhesive capsulitis of left shoulder 12/16/2016   Anxiety    Asthma without status asthmaticus 02/06/2021   At risk for falls 02/04/2019   Atrial fibrillation ([place]) 09/01/2014   Last Assessment & Plan:  Formatting of this note might be different from the original. A:  Chronic.  Sinus rhythm at this time.  States she was told this during admission at [facility] in the past. P:  On baby aspirin at home Paula continue.  Low dose metoprolol started.   Bipolar 1 disorder, depressed, moderate ([place]) 02/18/2020   Capsulitis of left shoulder  10/20/2021   Chronic anticoagulation 02/20/2020   Chronic atrial fibrillation (HCC) 02/18/2020   Chronic headache 10/11/2013   Chronic interstitial cystitis 12/28/2005   Chronic migraine without aura, intractable, without status migrainosus 06/24/2017   Colon cancer screening 04/15/2016   Last Assessment & Plan:  Formatting of this note might be different from the original. Paula schedule for colonoscopy   Depression, recurrent ([place]) 02/18/2020   Diabetes mellitus ([place])    Dyslipidemia [id number]   Dysmetabolic syndrome X  0000000   GAD (generalized anxiety disorder) 02/18/2020   GERD (gastroesophageal reflux disease) 11/05/2021   Heartburn 04/15/2016   Last Assessment & Plan:  Formatting of this note might be different from the original. Patient was counseled regarding lifestyle modification  Advised to take Prilosec 30 mins before breakfast  Paula schedule for EGD   History of atrial fibrillation 06/24/2017   History of posttraumatic stress disorder (PTSD) 10/07/2016   HLD (hyperlipidemia) 09/01/2014   Last Assessment & Plan:  Formatting of this note might be different from the original. A: Chronic. P: Continue statin.   Hyperosmolarity due to secondary diabetes mellitus ([place]) 02/06/2021   Hypertriglyceridemia 02/18/2020   Impaired mobility and activities of daily living 11/14/2015   Irritable bowel syndrome (IBS)    Junctional tachycardia 11/05/2021   Kidney stone 02/06/2021   Migraine without aura, not refractory 02/06/2021   Mixed hyperlipidemia 04/06/2021   Last Assessment & Plan:  Formatting of this note might be different from the original. Due for labs with PCP in November   Morbid obesity ([place]) 12/16/2016   Paroxysmal supraventricular tachycardia 11/05/2021   Pneumonia, organism unspecified(486) 11/21/2002   Polyneuropathy due to type 2 diabetes mellitus ([place]) 02/06/2021   Primary hypertension 04/06/2021   Last Assessment & Plan:  Formatting of this note might be different from the original. Controlled   Refractory migraine with aura 02/06/2021   Refractory migraine without aura [id number]   Renal colic on left side Q000111Q   Stage 3 chronic kidney disease ([place]) 02/20/2020   Unspecified adverse effect of other drug, medicinal and biological substance(995.29) 02/17/2006   Urinary tract obstruction 02/06/2021    Past Surgical History:  Procedure Laterality Date   ABDOMINAL HYSTERECTOMY     Fibroids   ATRIAL FIBRILLATION ABLATION     ATRIAL FIBRILLATION ABLATION N/A 10/27/2022    Procedure: ATRIAL FIBRILLATION ABLATION;  Surgeon: Constance Haw, MD;  Location: [place] [facility];  Service: Cardiovascular;  Laterality: N/A;   CARDIAC CATHETERIZATION     CARDIOVERSION     CESAREAN SECTION     KNEE SURGERY     LEFT HEART CATH AND CORONARY ANGIOGRAPHY N/A 03/04/2020   Procedure: LEFT HEART CATH AND CORONARY ANGIOGRAPHY;  Surgeon: Belva Crome, MD;  Location: [place] [facility];  Service: Cardiovascular;  Laterality: N/A;   ROTATOR CUFF REPAIR      Current Outpatient Medications  Medication Instructions   acetaminophen (TYLENOL) 1,000 mg, Oral, Every 6 hours PRN   albuterol (VENTOLIN HFA) 108 (90 Base) MCG/ACT inhaler 2 puffs, Inhalation, Every 4 hours PRN   apixaban (ELIQUIS) 5 mg, Oral, 2 times daily   atorvastatin (LIPITOR) 80 mg, Oral, Daily   blood glucose meter kit and supplies One Touch Ultra  Check blood sugars once daily Dx: E11.9   botulinum toxin Type A (BOTOX) 200 units injection Inject 155 units IM into multiple site in the face,neck and head once every 90 days   buPROPion (WELLBUTRIN XL) 300 mg, Oral, Daily   diltiazem (  CARDIZEM) 30 MG tablet Take 1 tablet every 4 hours AS NEEDED for AFIB heart rate >100 as long as top BP >100.   diphenhydrAMINE (BENADRYL) 25 mg, Oral, Daily PRN   EPINEPHrine (EPI-PEN) 0.3 mg, Intramuscular, As needed   ezetimibe (ZETIA) 10 mg, Oral, Daily   fenofibrate 160 mg, Oral, Daily   levocetirizine (XYZAL) 5 MG tablet TAKE 1 TABLET EVERY EVENING   Lurasidone HCl 120 mg, Oral, Daily at bedtime   magnesium oxide (MAG-OX) 400 mg, Oral, Daily   metoprolol succinate (TOPROL-XL) 50 mg, Oral, 2 times daily, Take with or immediately following a meal.   metoprolol tartrate (LOPRESSOR) 25 mg, Oral, Every 4 hours PRN   nitroGLYCERIN (NITROSTAT) 0.4 mg, Sublingual, Every 5 min PRN   ONETOUCH VERIO test strip USE DAILY TO CHECK BLOOD SUGAR   pantoprazole (PROTONIX) 40 mg, Oral, Daily   QUEtiapine (SEROQUEL) 300 mg, Oral, Daily at  bedtime   topiramate (TOPAMAX) 50 MG tablet TAKE 3 TABLETS TWICE A DAY   Trulicity 1.5 mg, Injection, Weekly   Vitamin B-12 5,000 mcg, Oral, Daily   Vitamin D3 5,000 Units, Oral, Daily   Zavzpret 10 mg, Nasal, Daily PRN    Social History:  The patient  reports that she has never smoked. She has never used smokeless tobacco. She reports that she does not drink alcohol and does not use drugs.   Family History:  The patient's family history includes Bipolar disorder in her daughter and son; Migraines in her daughter, daughter, and son. She was adopted.  ROS:  Please see the history of present illness. All other systems are reviewed and otherwise negative.   PHYSICAL EXAM:  VS:  BP 100/60   Pulse (!) 59   Ht 5\' 6"  (1.676 m)   Wt 224 lb 6.4 oz (101.8 kg)   SpO2 95%   BMI 36.22 kg/m  BMI: Body mass index is 36.22 kg/m.  GEN- The patient is well appearing, alert and oriented x 3 today.   HEENT: normocephalic, atraumatic; sclera clear, conjunctiva pink; hearing intact; oropharynx clear; neck supple, no JVP Lungs- Clear to ausculation bilaterally, normal work of breathing.  No wheezes, rales, rhonchi Heart- Regular rate and rhythm, no murmurs, rubs or gallops, PMI not laterally displaced GI- soft, non-tender, non-distended, bowel sounds present, no hepatosplenomegaly Extremities- No peripheral edema. no clubbing or cyanosis; DP/PT/radial pulses 2+ bilaterally MS- no significant deformity or atrophy Skin- warm and dry, no rash or lesion Psych- euthymic mood, full affect Neuro- strength and sensation are intact  EKG is not ordered.   Recent Labs: 07/08/2022: B Natriuretic Peptide 156.8; Magnesium 1.7; TSH 1.272 10/13/2022: Hemoglobin 12.1; Platelets 293 11/22/2022: ALT 24; BUN 17; Creatinine, Ser 1.92; Potassium 4.4; Sodium 140  11/22/2022: Cholesterol 136; HDL 34.30; LDL Cholesterol 67; Total CHOL/HDL Ratio 4; Triglycerides 172.0; VLDL 34.4   Estimated Creatinine Clearance: 35.2 mL/min  (A) (by C-G formula based on SCr of 1.92 mg/dL (H)).   Wt Readings from Last 3 Encounters:  12/03/22 224 lb 6.4 oz (101.8 kg)  11/24/22 220 lb 9.6 oz (100.1 kg)  11/22/22 221 lb (100.2 kg)     Additional studies reviewed include: Previous EP, cardiology notes.   AF ablation, 10/27/2022 1. Sinus rhythm upon presentation.   2. Successful electrical isolation and anatomical encircling of all four pulmonary veins with radiofrequency current. 3. No inducible arrhythmias following ablation both on and off of dobutamine 4. No early apparent complications.  Long Term Monitor, 04/22/22 Wear time 14 days Patient  had a min HR of 28 bpm, max HR of 74 bpm, and avg HR of 55 bpm. Predominant underlying rhythm was Sinus Rhythm. First Degree AV Block was present. Bundle Branch Block/IVCD was present. 7 Pauses occurred, the longest lasting 3.9 secs (15 bpm).  Junctional Rhythm was present. Junctional Rhythm was detected within +/- 45 seconds of symptomatic patient event(s). Isolated SVEs were rare (<1.0%), SVE Couplets were rare (<1.0%), and SVE Triplets were rare (<1.0%). Isolated VEs were rare (<1.0%), and  no VE Couplets or VE Triplets were present. Ventricular Trigeminy was present. Summary and conclusions: Pauses noted longest 3.9 seconds total number of pauses 7, majority of those happened during the night. Mechanism was a sinus arrest   Long Term Monitor, 01/12/22 Wear time 14 days Patient had a min HR of 39 bpm, max HR of 120 bpm, and avg HR of 62 bpm. Predominant underlying rhythm was Sinus Rhythm. 2 Supraventricular Tachycardia runs occurred, the run with the fastest interval lasting 7 beats with a max rate of 120 bpm, the  longest lasting 8 beats with an avg rate of 97 bpm. Isolated SVEs were rare (<1.0%), SVE Couplets were rare (<1.0%), and SVE Triplets were rare (<1.0%). Isolated VEs were rare (<1.0%), and no VE Couplets or VE Triplets were present. Ventricular Trigeminy  was present.   Summary  and conclusions:  Short episodes of SVT, rest normal, no need to treat   TTE, 12/17/21  1. Left ventricular ejection fraction, by estimation, is 50 to 55%. The left ventricle has low normal function. The left ventricle has no regional wall motion abnormalities. There is mild left ventricular hypertrophy. Left ventricular diastolic  parameters are consistent with Grade II diastolic dysfunction (pseudonormalization).   2. Right ventricular systolic function is normal. The right ventricular size is normal. There is normal pulmonary artery systolic pressure.   3. Left atrial size was mildly dilated.   4. Right atrial size was mildly dilated.   5. The mitral valve is normal in structure. No evidence of mitral valve regurgitation. No evidence of mitral stenosis.   6. Tricuspid valve regurgitation is mild to moderate.   7. The aortic valve is normal in structure. Aortic valve regurgitation is not visualized. No aortic stenosis is present.   8. The inferior vena cava is normal in size with greater than 50% respiratory variability, suggesting right atrial pressure of 3 mmHg.    ASSESSMENT AND PLAN:  #) SVT #) palpitations #) chest pressure Not able to be induced during recent EP study Symptomatic episodes during 01/2022 long term monitor associated with SR Continue toprol 50mg  BID Continue lopressor or dilt PRN for symptomatic episodes - encouraged to take PRNs  Lifestyle modifications - keep hydrated, eat meals regularly, increase exercise Recommended to keep a symptom diary and use apple watch to record rhythms and send to office for review  #) parox AF #) Hypercoag d/t AF S/p afib and flutter ablation 2020 in [place]. S/p repeat ablation 10/27/22. Previously on flecainide Toprol as above CHA2DS2-VASc Score = 3 [CHF History: 0, HTN History: 0, Diabetes History: 1, Stroke History: 0, Vascular Disease History: 0, Age Score: 1, Gender Score: 1].  Therefore, the patient's annual risk of stroke is 3.2  % OAC - eliquis 5mg  BID, appropriately dosed.    Consider repeat sleep study in future  Current medicines are reviewed at length with the patient today.   The patient does not have concerns regarding her medicines.  The following changes were made today:  none  Labs/ tests ordered today include:  No orders of the defined types were placed in this encounter.    Disposition: Follow up with Dr. Curt Bears in as usual post procedure when back in [place]   Signed, Mamie Levers, NP  12/03/22  11:41 AM  Electrophysiology [facility]

## 2022-12-03 ENCOUNTER — Encounter: Payer: Self-pay | Admitting: Cardiology

## 2022-12-03 ENCOUNTER — Ambulatory Visit: Payer: Medicare (Managed Care) | Attending: Physician Assistant | Admitting: Cardiology

## 2022-12-03 VITALS — BP 100/60 | HR 59 | Ht 66.0 in | Wt 224.4 lb

## 2022-12-03 DIAGNOSIS — D6869 Other thrombophilia: Secondary | ICD-10-CM

## 2022-12-03 DIAGNOSIS — I480 Paroxysmal atrial fibrillation: Secondary | ICD-10-CM

## 2022-12-03 DIAGNOSIS — R079 Chest pain, unspecified: Secondary | ICD-10-CM

## 2022-12-03 DIAGNOSIS — I10 Essential (primary) hypertension: Secondary | ICD-10-CM

## 2022-12-03 DIAGNOSIS — I471 Supraventricular tachycardia, unspecified: Secondary | ICD-10-CM | POA: Diagnosis not present

## 2022-12-03 MED ORDER — METOPROLOL SUCCINATE ER 50 MG PO TB24: 50.0000 mg | ORAL_TABLET | Freq: Two times a day (BID) | ORAL | 3 refills | Status: DC

## 2022-12-03 MED ORDER — DILTIAZEM HCL 30 MG PO TABS: ORAL_TABLET | ORAL | 1 refills | Status: DC

## 2022-12-03 NOTE — Patient Instructions (Addendum)
Please start keeping a symptom diary Record episodes on watch and send EKGs to Dr. Curt Bears on [place]. Be sure to include symptoms, duration, and any other associated information Keep well-hydrated, eat meals regularly Increase physical activity by walking ~30 minutes / day most days of the week  IF HR elevated, but BP low - OK to take lopressor as needed for SVT episodes  IF HR elevated and top BP > 100 - OK to take diltiazem 30mg  as needed for SVT and Afib episodes    Keep [place] updated on how you're doing via mychart.  See you in June (or sooner virtually, if needed)  Medication Instructions:   START TAKING : TOPROL XL 50 MG TWICE  A DAY   *If you need a refill on your cardiac medications before your next appointment, please call your pharmacy*   Lab Work:  [place]    If you have labs (blood work) drawn today and your tests are completely normal, you will receive your results only by: [place] (if you have MyChart) OR A paper copy in the mail If you have any lab test that is abnormal or we need to change your treatment, we will call you to review the results.   Testing/Procedures: NONE ORDERED  TODAY    Follow-Up: At [facility], you and your health needs are our priority.  As part of our continuing mission to provide you with exceptional heart care, we have created designated Provider Care Teams.  These Care Teams include your primary Cardiologist (physician) and Advanced Practice Providers (APPs -  Physician Assistants and Nurse Practitioners) who all work together to provide you with the care you need, when you need it.  We recommend signing up for the patient portal called "MyChart".  Sign up information is provided on this After Visit Summary.  MyChart is used to connect with patients for Virtual Visits (Telemedicine).  Patients are able to view lab/test results, encounter notes, upcoming appointments, etc.  Non-urgent messages can be sent to  your provider as well.   To learn more about what you can do with MyChart, go to [email or web address].    Your next appointment:  AS SCHEDULED

## 2022-12-15 ENCOUNTER — Other Ambulatory Visit: Payer: Self-pay | Admitting: *Deleted

## 2022-12-15 ENCOUNTER — Other Ambulatory Visit (HOSPITAL_COMMUNITY): Payer: Self-pay

## 2022-12-15 MED ORDER — METOPROLOL SUCCINATE ER 100 MG PO TB24: 100.0000 mg | ORAL_TABLET | Freq: Every day | ORAL | 3 refills | Status: DC

## 2022-12-17 DIAGNOSIS — J019 Acute sinusitis, unspecified: Secondary | ICD-10-CM | POA: Diagnosis not present

## 2022-12-17 DIAGNOSIS — J209 Acute bronchitis, unspecified: Secondary | ICD-10-CM | POA: Diagnosis not present

## 2022-12-20 ENCOUNTER — Other Ambulatory Visit: Payer: Self-pay | Admitting: Family Medicine

## 2022-12-20 ENCOUNTER — Telehealth: Payer: Self-pay | Admitting: Licensed Clinical Social Worker

## 2022-12-20 ENCOUNTER — Ambulatory Visit: Payer: Self-pay | Admitting: Licensed Clinical Social Worker

## 2022-12-20 ENCOUNTER — Encounter: Payer: Self-pay | Admitting: Family Medicine

## 2022-12-20 ENCOUNTER — Encounter: Payer: Self-pay | Admitting: *Deleted

## 2022-12-20 DIAGNOSIS — F339 Major depressive disorder, recurrent, unspecified: Secondary | ICD-10-CM

## 2022-12-20 DIAGNOSIS — F419 Anxiety disorder, unspecified: Secondary | ICD-10-CM

## 2022-12-20 DIAGNOSIS — F319 Bipolar disorder, unspecified: Secondary | ICD-10-CM

## 2022-12-20 MED ORDER — TRULICITY 3 MG/0.5ML ~~LOC~~ SOAJ: 3.0000 mg | SUBCUTANEOUS | 0 refills | Status: DC

## 2022-12-20 NOTE — Patient Outreach (Signed)
  Care Coordination   12/20/2022 Name: Paula Stout MRN: [id number] DOB: 01-21-1956   Care Coordination Outreach Attempts:  A third unsuccessful outreach was attempted today to offer the patient with information about available care coordination services as a benefit of their health plan.   Follow Up Plan:  No further outreach attempts will be made at this time. We have been unable to contact the patient to offer or enroll patient in care coordination services  Encounter Outcome:  No Answer   Care Coordination Interventions:  No, not indicated    Sammuel Hines, LCSW Social Work Care Coordination  [facility] [employer] [employer] [phone]

## 2022-12-20 NOTE — Patient Instructions (Signed)
   Care Coordination provides support specific to your health needs that extend beyond exceptional routine office care you already receive from your primary care doctor.    If you are eligible for standard Care Coordination, there is no cost to you.  The Care Coordination team is made up of the following team members: Registered Nurse Care Guide: disease management, health education, care coordination and complex case management Clinical Social Work: Complex Care Coordination including coordination of level of care needs, mental and behavioral health assessment and recommendations, and connection to long-term mental health support Clinical Pharmacist: medication management, assistance and disease management Community Resource Care Guides: community resource connections Scheduling Team: dedicated team of scheduling professionals to support patient and clinical team scheduling needs  Please call [phone] if you would like to schedule a phone appointment with one of the team members.   Leticia Coletta, LCSW Social Work Care Coordination  [facility] [phone]  

## 2022-12-20 NOTE — Patient Outreach (Signed)
  Care Coordination  Initial Visit Note   12/20/2022 Name: Paula Stout MRN: [id number] DOB: Jul 14, 1956  Paula Stout is a 67 y.o. year old female who sees Carmelia Roller, Jilda Roche, DO for primary care. I spoke with  Waynetta Pean by phone today.  What matters to the patients health and wellness today?  Managing her mental health, declines a need for RN care management at this time.   Patient has has not seen a therapist or psychiatry in several years. Reports PCP has been filling medication however she would like to reconnect with psychiatry.  Last therapist was about 2 years go for about 9 month.  Reports symptoms of depression are betting since her vacation.   She is currently visiting family in [place] and will return home June 12th     Goals Addressed             This Visit's Progress    Connect for therapy and Medication Management       Activities and task to complete in order to accomplish goals.   I have placed a referral with [facility] located on [address] su[place] [phone] based on your request they will contact you.  Please call them if you have questions. I have place a referral with [facility] to assist with managing your mental health needs. They will contact you. Please contact them  to follow up if needed [phone].        SDOH assessments and interventions completed:  Yes  SDOH Interventions Today    Flowsheet Row Most Recent Value  SDOH Interventions   Stress Interventions Provide Counseling       Care Coordination Interventions:  Yes, provided  Interventions Today    Flowsheet Row Most Recent Value  Chronic Disease   Chronic disease during today's visit Diabetes, Chronic Kidney Disease/End Stage Renal Disease (ESRD)  General Interventions   General Interventions Discussed/Reviewed General Interventions Discussed  [reviewed care coordination program]  Mental Health Interventions    Mental Health Discussed/Reviewed Mental Health Discussed, Depression  [would like to connect to mental health providers]       Follow up plan:  Referral made to [facility] and [facility]  Follow up call scheduled for 3 weeks  01/06/23    Encounter Outcome:  Pt. Visit Completed   Sammuel Hines, LCSW Social Work Care Coordination  [facility] [employer] [employer] [phone]

## 2022-12-20 NOTE — Patient Instructions (Signed)
Visit Information  Thank you for taking time to visit with me today. Please don't hesitate to contact me if I can be of assistance to you.   Following are the goals we discussed today:   Goals Addressed             This Visit's Progress    Connect for therapy and Medication Management       Activities and task to complete in order to accomplish goals.   I have placed a referral with [facility] located on [address] [phone] based on your request they will contact you.  Please call them if you have questions. I have place a referral with [facility] to assist with managing your mental health needs. They will contact you. Please contact them  to follow up if needed [phone].         Our next appointment is by telephone on 01/10/23 at 1:15  Please call the care guide team at [phone] if you need to cancel or reschedule your appointment.   If you are experiencing a Mental Health or Behavioral Health Crisis or need someone to talk to, please call the Suicide and Crisis Lifeline: 988 call the [place] National Suicide Prevention Lifeline: (774) 345-0828 or TTY: (470)197-1688 TTY 445-473-6287) to talk to a trained counselor call 1-800-273-TALK (toll free, 24 hour hotline)   Patient verbalizes understanding of instructions and care plan provided today and agrees to view in MyChart. Active MyChart status and patient understanding of how to access instructions and care plan via MyChart confirmed with patient.     Sammuel Hines, LCSW Social Work Care Coordination  [facility] [facility] [employer] [phone]

## 2022-12-21 ENCOUNTER — Telehealth: Payer: Self-pay

## 2022-12-21 NOTE — Progress Notes (Signed)
   Care Guide Note  12/21/2022 Name: Paula Stout MRN: [id number] DOB: 11/08/1955  Referred by: Sharlene Dory, DO Reason for referral : Care Coordination (Outreach to schedule with pharm d )   Paula Stout is a 67 y.o. year old female who is a primary care patient of Sharlene Dory, DO. Paula Stout was referred to the pharmacist for assistance related to HTN and DM.    An unsuccessful telephone outreach was attempted today to contact the patient who was referred to the pharmacy team for assistance with medication assistance. Additional attempts will be made to contact the patient.   Paula Stout, RMA Care Guide [facility]  [place], [place] [address] Direct Dial: [phone] [email or web address]

## 2022-12-22 ENCOUNTER — Encounter: Payer: Self-pay | Admitting: Family Medicine

## 2022-12-24 ENCOUNTER — Telehealth (INDEPENDENT_AMBULATORY_CARE_PROVIDER_SITE_OTHER): Payer: Medicare (Managed Care) | Admitting: Family Medicine

## 2022-12-24 ENCOUNTER — Encounter: Payer: Self-pay | Admitting: Family Medicine

## 2022-12-24 ENCOUNTER — Ambulatory Visit: Payer: Medicare (Managed Care) | Admitting: Neurology

## 2022-12-24 DIAGNOSIS — J4521 Mild intermittent asthma with (acute) exacerbation: Secondary | ICD-10-CM

## 2022-12-24 DIAGNOSIS — J0100 Acute maxillary sinusitis, unspecified: Secondary | ICD-10-CM

## 2022-12-24 MED ORDER — FLUCONAZOLE 150 MG PO TABS: ORAL_TABLET | ORAL | 0 refills | Status: DC

## 2022-12-24 MED ORDER — DOXYCYCLINE HYCLATE 100 MG PO TABS: 100.0000 mg | ORAL_TABLET | Freq: Two times a day (BID) | ORAL | 0 refills | Status: AC

## 2022-12-24 MED ORDER — PREDNISONE 20 MG PO TABS
40.0000 mg | ORAL_TABLET | Freq: Every day | ORAL | 0 refills | Status: AC
Start: 2022-12-24 — End: 2022-12-29

## 2022-12-24 NOTE — Progress Notes (Signed)
Chief Complaint  Patient presents with   Cough    Nasal congestion    Paula Stout here for URI complaints. We are interacting via web portal for an electronic face-to-face visit. I verified patient's ID using 2 identifiers. Patient agreed to proceed with visit via this method. Patient is at her daughter's home, I am at office. Patient and I are present for visit.   Duration: 9 days- worsened for a few days Associated symptoms: sinus congestion, sinus pain, rhinorrhea, wheezing, shortness of breath, and coughing, dental pain Denies: itchy watery eyes, ear pain, ear drainage, sore throat, myalgia, and fevers Treatment to date: Using inhaler  Hx of asthma Sick contacts: Yes; family members  Past Medical History:  Diagnosis Date   A-fib [facility])    ACS (acute coronary syndrome) (HCC) 07/31/2021   Acute cystitis without hematuria 05/14/2017   Last Assessment & Plan:  Formatting of this note might be different from the original. - suprapubic tenderness with symptoms of urinary frequency and urgency and subjective fevers (no fevers recorded on any visit). Recently passed a renal stone, US shows left hydronephrosis which is improving.  - UA negative8/24 but pt is clinically symptomatic - will repeat UA  - start pt on Microbid for 5 days   Adhesive capsulitis of left shoulder 12/16/2016   Anxiety    Asthma without status asthmaticus 02/06/2021   At risk for falls 02/04/2019   Atrial fibrillation (HCC) 09/01/2014   Last Assessment & Plan:  Formatting of this note might be different from the original. A:  Chronic.  Sinus rhythm at this time.  States she was told this during admission at [facility] in the past. P:  On baby aspirin at home will continue.  Low dose metoprolol started.   Bipolar 1 disorder, depressed, moderate (HCC) 02/18/2020   Capsulitis of left shoulder 10/20/2021   Chronic anticoagulation 02/20/2020   Chronic atrial fibrillation (HCC) 02/18/2020   Chronic headache 10/11/2013    Chronic interstitial cystitis 12/28/2005   Chronic migraine without aura, intractable, without status migrainosus 06/24/2017   Colon cancer screening 04/15/2016   Last Assessment & Plan:  Formatting of this note might be different from the original. Will schedule for colonoscopy   Depression, recurrent (HCC) 02/18/2020   Diabetes mellitus (HCC)    Dyslipidemia 02/20/2020   Dysmetabolic syndrome X 03/20/2008   GAD (generalized anxiety disorder) 02/18/2020   GERD (gastroesophageal reflux disease) 11/05/2021   Heartburn 04/15/2016   Last Assessment & Plan:  Formatting of this note might be different from the original. Patient was counseled regarding lifestyle modification  Advised to take Prilosec 30 mins before breakfast  Will schedule for EGD   History of atrial fibrillation 06/24/2017   History of posttraumatic stress disorder (PTSD) 10/07/2016   HLD (hyperlipidemia) 09/01/2014   Last Assessment & Plan:  Formatting of this note might be different from the original. A: Chronic. P: Continue statin.   Hyperosmolarity due to secondary diabetes mellitus (HCC) 02/06/2021   Hypertriglyceridemia 02/18/2020   Impaired mobility and activities of daily living 11/14/2015   Irritable bowel syndrome (IBS)    Junctional tachycardia 11/05/2021   Kidney stone 02/06/2021   Migraine without aura, not refractory 02/06/2021   Mixed hyperlipidemia 04/06/2021   Last Assessment & Plan:  Formatting of this note might be different from the original. Due for labs with PCP in November   Morbid obesity (HCC) 12/16/2016   Paroxysmal supraventricular tachycardia 11/05/2021   Pneumonia, organism unspecified(486) 11/21/2002   Polyneuropathy due  to type 2 diabetes mellitus (HCC) 02/06/2021   Primary hypertension 04/06/2021   Last Assessment & Plan:  Formatting of this note might be different from the original. Controlled   Refractory migraine with aura 02/06/2021   Refractory migraine without aura 02/06/2021   Renal  colic on left side 04/22/2017   Stage 3 chronic kidney disease (HCC) 02/20/2020   Unspecified adverse effect of other drug, medicinal and biological substance(995.29) 02/17/2006   Urinary tract obstruction 02/06/2021    Objective No conversational dyspnea Age appropriate judgment and insight Nml affect and mood  Acute maxillary sinusitis, recurrence not specified - Plan: doxycycline (VIBRA-TABS) 100 MG tablet, fluconazole (DIFLUCAN) 150 MG tablet  Mild intermittent asthma with exacerbation - Plan: predniSONE (DELTASONE) 20 MG tablet  7 d of doxy given duration and worsening. Diflucan prn. Continue to push fluids, practice good hand hygiene, cover mouth when coughing. #1 likely caused this exacerbation of a chronic issue. 5 d pred burst 40 mg/d. Cont SABA prn.  F/u prn. If starting to experience fevers, shaking, or worsening shortness of breath, seek immediate care. Pt voiced understanding and agreement to the plan.  Jilda Roche [place], DO 12/24/22 3:14 PM

## 2022-12-27 ENCOUNTER — Other Ambulatory Visit: Payer: Self-pay | Admitting: Family Medicine

## 2022-12-27 MED ORDER — SEMAGLUTIDE (1 MG/DOSE) 4 MG/3ML ~~LOC~~ SOPN
1.0000 mg | PEN_INJECTOR | SUBCUTANEOUS | 5 refills | Status: DC
Start: 2022-12-27 — End: 2023-01-26

## 2023-01-10 ENCOUNTER — Ambulatory Visit: Payer: Medicare (Managed Care) | Admitting: Behavioral Health

## 2023-01-10 ENCOUNTER — Ambulatory Visit: Payer: Self-pay | Admitting: Licensed Clinical Social Worker

## 2023-01-10 NOTE — Patient Instructions (Signed)
Visit Information  Thank you for taking time to visit with me today. Please don't hesitate to contact me if I can be of assistance to you.   Following are the goals we discussed today:   Goals Addressed             This Visit's Progress    COMPLETED: Connect for therapy and Medication Management       Activities and task to complete in order to accomplish goals.   I have placed a referral for pharmacy support they will contact you.   I am glad you were able to connect with [facility] to get your initial appointment scheduled.  Please contact them  to follow up if you have questions [phone].         Please call the care guide team at [phone] if you need to cancel or reschedule your appointment.   If you are experiencing a Mental Health or Behavioral Health Crisis or need someone to talk to, please call the [place] National Suicide Prevention Lifeline: 301-356-2209 or TTY: (818)097-9248 TTY 939-375-4999) to talk to a trained counselor call 1-800-273-TALK (toll free, 24 hour hotline)   Patient verbalizes understanding of instructions and care plan provided today and agrees to view in MyChart. Active MyChart status and patient understanding of how to access instructions and care plan via MyChart confirmed with patient.     No further follow up required: By Social Work at this time  Sammuel Hines, [employer] Social Work Care Coordination  [employer] Emmie Niemann [employer] [phone]

## 2023-01-10 NOTE — Patient Outreach (Signed)
  Care Coordination  Follow Up Visit Note   01/10/2023 Name: Cammi Kluver MRN: [id number] DOB: 1956-03-03  Yachet Rigaud is a 67 y.o. year old female who sees Carmelia Roller, Jilda Roche, DO for primary care. I spoke with  Waynetta Pean by phone today.  What matters to the patients health and wellness today?  Getting answers about her diabetic medication   Patient has connect for to get initial therapy appointment scheduled.  Still waiting to schedule with pharmacy. LCSW assisted with removing barriers to care. No other needs identified during this encounter   Goals Addressed             This Visit's Progress    COMPLETED: Connect for therapy and Medication Management       Activities and task to complete in order to accomplish goals.   I have placed a referral for pharmacy support they will contact you.   I am glad you were able to connect with [facility] to get your initial appointment scheduled.  Please contact them  to follow up if you have questions [phone].        SDOH assessments and interventions completed:  No   Care Coordination Interventions:  Yes, provided  Interventions Today    Flowsheet Row Most Recent Value  Chronic Disease   Chronic disease during today's visit Diabetes, Chronic Kidney Disease/End Stage Renal Disease (ESRD)  General Interventions   General Interventions Discussed/Reviewed Communication with  Communication with --  Psychologist, clinical for pharmacy and Scheduler for [employer] [facility]]  Mental Health Interventions   Mental Health Discussed/Reviewed Mental Health Reviewed  Pharmacy Interventions   Pharmacy Dicussed/Reviewed Pharmacy Topics Reviewed, Referral to Pharmacist  [facility] for barriers]       Follow up plan: No further intervention required. You do not desire ongoing support  Encounter Outcome:  Pt. Visit Completed   Sammuel Hines, LCSW Social Work Care Coordination  [facility] Emmie Niemann [employer] [phone]

## 2023-01-10 NOTE — Progress Notes (Signed)
   Care Guide Note  01/10/2023 Name: Paula Stout MRN: [id number] DOB: 12-19-1955  Referred by: Sharlene Dory, DO Reason for referral : Care Coordination (Outreach to schedule with pharm d )   Paula Stout is a 67 y.o. year old female who is a primary care patient of Sharlene Dory, DO. Paula Stout was referred to the pharmacist for assistance related to DM.    An unsuccessful telephone outreach was attempted today to contact the patient who was referred to the pharmacy team for assistance with medication management. Additional attempts will be made to contact the patient.   Paula Stout, Paula Stout Care Guide [facility]  [place], [place] [address] Direct Dial: [phone] [email or web address]

## 2023-01-11 ENCOUNTER — Other Ambulatory Visit: Payer: Medicare (Managed Care) | Admitting: Pharmacist

## 2023-01-11 ENCOUNTER — Encounter: Payer: Self-pay | Admitting: Pharmacist

## 2023-01-11 NOTE — Progress Notes (Signed)
01/11/2023 Name: Paula Stout MRN: [id number] DOB: 09-13-1955  Chief Complaint  Patient presents with   Medication Management    Paula Stout is a 67 y.o. year old female who presented for a telephone visit.   They were referred to the pharmacist by their PCP for assistance in managing medication access and complex medication management.   Subjective: Patient reports difficulty affording medications. She is relatively new to Medicare. She is widowed .  She currently is in [place] with her daughter (this phone visit will not be billed to patient's insurance).   Patient reports affordability concerns with their medications: Yes  Patient reports access/transportation concerns to their pharmacy: No  Patient reports adherence concerns with their medications:  Yes  states that she has taken 2 doses of Ozempic 1mg  and has had extremem nausea with both. She has taken Trulicity 3mg  weekly in the past without side effects but has not been able to get Trulicity due to shortage.   Patient also mentions that Eliquis, Lurasidone and GLP1 agents are costly. This is her first year having Medicare so she is not sure about coverage gap but I am sure she will likely hit gap around July or August.   She is to see Dr Everlena Cooper when she returns home to discuss migraine treatment - She received $3000 bill for Botox injections and has not been able to afford Zavzpret.    Objective:  Lab Results  Component Value Date   HGBA1C 6.2 11/22/2022    Lab Results  Component Value Date   CREATININE 1.92 (H) 11/22/2022   BUN 17 11/22/2022   NA 140 11/22/2022   K 4.4 11/22/2022   CL 109 11/22/2022   CO2 21 11/22/2022    Lab Results  Component Value Date   CHOL 136 11/22/2022   HDL 34.30 (L) 11/22/2022   LDLCALC 67 11/22/2022   TRIG 172.0 (H) 11/22/2022   CHOLHDL 4 11/22/2022    Medications Reviewed Today     Reviewed by Henrene Pastor, RPH-CPP (Pharmacist) on 01/11/23 at 1633  Med List  Status: <None>   Medication Order Taking? Sig Documenting Provider Last Dose Status Informant  acetaminophen (TYLENOL) 500 MG tablet [id number] Yes Take 1,000 mg by mouth every 6 (six) hours as needed for mild pain. [provider] Taking Active Self  albuterol (VENTOLIN HFA) 108 (90 Base) MCG/ACT inhaler [id number] Yes Inhale 2 puffs into the lungs every 4 (four) hours as needed for wheezing. Sharlene Dory, DO Taking Active Self  apixaban (ELIQUIS) 5 MG TABS tablet [id number] Yes Take 1 tablet (5 mg total) by mouth 2 (two) times daily. Regan Lemming, MD Taking Active Self  atorvastatin (LIPITOR) 80 MG tablet [id number] Yes Take 1 tablet (80 mg total) by mouth daily. Sharlene Dory, DO Taking Active Self  blood glucose meter kit and supplies [id number] Yes One Touch Ultra  Check blood sugars once daily Dx: E11.9  Patient taking differently: 1 each by Other route See admin instructions. One Touch Ultra  Check blood sugars once daily Dx: E11.9   Sharlene Dory, DO Taking Active Self  botulinum toxin Type A (BOTOX) 200 units injection [id number] Yes Inject 155 units IM into multiple site in the face,neck and head once every 90 days  Patient taking differently: Inject 200 Units into the muscle every 3 (three) months. Inject 155 units IM into multiple site in the face,neck and head once every 90 days   Drema Dallas, DO Taking Active Self  buPROPion (WELLBUTRIN XL) 300 MG 24 hr tablet [id number] Yes TAKE 1 TABLET DAILY Sharlene Dory, DO Taking Active   Cholecalciferol (VITAMIN D3) 125 MCG (5000 UT) capsule [id number] Yes Take 5,000 Units by mouth daily. [provider] Taking Active Self  Cyanocobalamin (VITAMIN B-12) 5000 MCG TBDP [id number] Yes Take 5,000 mcg by mouth daily. [provider] Taking Active Self  diltiazem (CARDIZEM) 30 MG tablet [id number]  Take 1 tablet every 4 hours AS NEEDED for AFIB or SVT heart rate >95 as long as top BP  >100. Sherie Don, NP  Active   diphenhydrAMINE (BENADRYL) 25 MG tablet [id number]  Take 25 mg by mouth daily as needed for allergies (seasonal). [provider]  Active Self           Med Note Roxan Hockey, [place] R   Fri Dec 03, 2022 10:47 AM)    EPINEPHrine 0.3 mg/0.3 mL IJ SOAJ injection [id number]  Inject 0.3 mg into the muscle as needed for anaphylaxis. Sharlene Dory, DO  Active Self  ezetimibe (ZETIA) 10 MG tablet [id number] Yes TAKE 1 TABLET DAILY Sharlene Dory, DO Taking Active Self  fenofibrate 160 MG tablet [id number] Yes Take 1 tablet (160 mg total) by mouth daily. Sharlene Dory, DO Taking Active   levocetirizine (XYZAL) 5 MG tablet [id number] Yes TAKE 1 TABLET EVERY EVENING  Patient taking differently: Take 5 mg by mouth every evening.   Sharlene Dory, DO Taking Active Self  Lurasidone HCl 120 MG TABS [id number] Yes Take 1 tablet (120 mg total) by mouth at bedtime. Sharlene Dory, DO Taking Active Self  magnesium oxide (MAG-OX) 400 (240 Mg) MG tablet [id number] Yes Take 400 mg by mouth daily. [provider] Taking Active Self  metoprolol succinate (TOPROL-XL) 100 MG 24 hr tablet [id number] Yes Take 1 tablet (100 mg total) by mouth daily. Take with or immediately following a meal. Camnitz, Will Daphine Deutscher, MD Taking Active   metoprolol tartrate (LOPRESSOR) 25 MG tablet [id number] Yes Take 1 tablet (25 mg total) by mouth every 4 (four) hours as needed (PALPITATIONS). Regan Lemming, MD Taking Active Self  nitroGLYCERIN (NITROSTAT) 0.4 MG SL tablet [id number] Yes Place 1 tablet (0.4 mg total) under the tongue every 5 (five) minutes as needed for chest pain. Georgeanna Lea, MD Taking Active Self  [facility] VERIO test strip [id number]  USE DAILY TO CHECK BLOOD SUGAR Sharlene Dory, [place]  Active   pantoprazole (PROTONIX) 40 MG tablet [id number] Yes Take 1 tablet (40 mg total) by mouth daily. Sharlene Dory, DO Taking  Active   QUEtiapine (SEROQUEL) 300 MG tablet [id number] Yes Take 1 tablet (300 mg total) by mouth at bedtime. Sharlene Dory, DO Taking Active Self  Semaglutide, 1 MG/DOSE, 4 MG/3ML SOPN [id number] No Inject 1 mg into the skin once a week.  Patient not taking: Reported on 01/11/2023   Sharlene Dory, DO Not Taking Active   topiramate (TOPAMAX) 50 MG tablet [id number] Yes TAKE 3 TABLETS TWICE A DAY  Patient taking differently: Take 150 mg by mouth 2 (two) times daily.   Sharlene Dory, DO Taking Active Self  Zavegepant HCl (ZAVZPRET) 10 MG/ACT Criss Rosales [id number] No Place 10 mg into the nose daily as needed.  Patient not taking: Reported on 01/11/2023   Drema Dallas, DO Not Taking Active            Med Note Haze Justin   Tue Jan 11, 2023  3:24 PM)  Not taking due to cost $100 / dose              Assessment/Plan:  Medication Management - patient having difficulty tolerating full dose of Ozempic 1mg  and also having issues with medication costs.  - assisted patient in applying for LIS. I feel that she will qualify, This will lower her brand copay to around $11.20 and generics to $0 to $4.  - If patient is not approved for LIS, then will apply for medication assistance programs for her branded meds.  - Recommended she lower dose of Ozempic - she will use 15 clicks (~ 0.25mg ) of Ozempic for the next 2 weeks, If tolerated will increase to 30 clicks. (~0.5mg ) - Reviewed Rosann Auerbach ([id number]) formulary  Tier 3 meds ($45 / month) Eliquis, Ozempic  Estimated cost per Medicare.gov for lurasidone is $20  Bupropion and fenofibrate $12 / 90 days  - Check possible migraine treatments:   Nurtec is on formulary - tier 3 $45 / month - can only get #16 / 30 days (taken every other day)    Ajovy is tier 3 too - $45 / month     Follow Up Plan: June 2024  Henrene Pastor, PharmD Clinical Pharmacist [facility]

## 2023-01-14 ENCOUNTER — Other Ambulatory Visit: Payer: Self-pay | Admitting: Family Medicine

## 2023-01-19 ENCOUNTER — Ambulatory Visit: Payer: Commercial Managed Care - HMO | Admitting: Family Medicine

## 2023-01-20 ENCOUNTER — Ambulatory Visit: Payer: Medicare (Managed Care) | Admitting: Behavioral Health

## 2023-01-21 ENCOUNTER — Other Ambulatory Visit (HOSPITAL_COMMUNITY): Payer: Self-pay

## 2023-01-26 ENCOUNTER — Other Ambulatory Visit: Payer: Self-pay | Admitting: Family Medicine

## 2023-01-26 ENCOUNTER — Encounter: Payer: Self-pay | Admitting: Family Medicine

## 2023-01-27 ENCOUNTER — Other Ambulatory Visit: Payer: Self-pay | Admitting: Family Medicine

## 2023-01-27 MED ORDER — METFORMIN HCL ER 500 MG PO TB24: 1000.0000 mg | ORAL_TABLET | Freq: Every day | ORAL | 1 refills | Status: DC

## 2023-01-27 MED ORDER — METFORMIN HCL ER 500 MG PO TB24: 1000.0000 mg | ORAL_TABLET | Freq: Every day | ORAL | 0 refills | Status: DC

## 2023-01-27 MED ORDER — ONDANSETRON 4 MG PO TBDP: 4.0000 mg | ORAL_TABLET | Freq: Three times a day (TID) | ORAL | 0 refills | Status: DC | PRN

## 2023-01-29 ENCOUNTER — Telehealth: Payer: Medicare (Managed Care) | Admitting: Family

## 2023-01-29 DIAGNOSIS — E669 Obesity, unspecified: Secondary | ICD-10-CM | POA: Diagnosis not present

## 2023-01-29 DIAGNOSIS — E1169 Type 2 diabetes mellitus with other specified complication: Secondary | ICD-10-CM

## 2023-01-29 DIAGNOSIS — J4521 Mild intermittent asthma with (acute) exacerbation: Secondary | ICD-10-CM

## 2023-01-29 MED ORDER — FLUTICASONE PROPIONATE 50 MCG/ACT NA SUSP
2.0000 | Freq: Every day | NASAL | 6 refills | Status: DC
Start: 2023-01-29 — End: 2024-05-01

## 2023-01-29 MED ORDER — PREDNISONE 10 MG (21) PO TBPK
ORAL_TABLET | ORAL | 0 refills | Status: DC
Start: 2023-01-29 — End: 2023-02-21

## 2023-01-29 MED ORDER — BENZONATATE 100 MG PO CAPS
100.0000 mg | ORAL_CAPSULE | Freq: Three times a day (TID) | ORAL | 0 refills | Status: DC | PRN
Start: 2023-01-29 — End: 2023-02-21

## 2023-01-29 NOTE — Progress Notes (Signed)
Virtual Visit Consent   Paula Stout, you are scheduled for a virtual visit with a Center provider today. Just as with appointments in the office, your consent must be obtained to participate. Your consent will be active for this visit and any virtual visit you may have with one of our providers in the next 365 days. If you have a MyChart account, a copy of this consent can be sent to you electronically.  As this is a virtual visit, video technology does not allow for your provider to perform a traditional examination. This may limit your provider's ability to fully assess your condition. If your provider identifies any concerns that need to be evaluated in person or the need to arrange testing (such as labs, EKG, etc.), we will make arrangements to do so. Although advances in technology are sophisticated, we cannot ensure that it will always work on either your end or our end. If the connection with a video visit is poor, the visit may have to be switched to a telephone visit. With either a video or telephone visit, we are not always able to ensure that we have a secure connection.  By engaging in this virtual visit, you consent to the provision of healthcare and authorize for your insurance to be billed (if applicable) for the services provided during this visit. Depending on your insurance coverage, you may receive a charge related to this service.  I need to obtain your verbal consent now. Are you willing to proceed with your visit today? Paula Stout has provided verbal consent on 01/29/2023 for a virtual visit (video or telephone). Jannifer Rodney, FNP  Date: 01/29/2023 6:02 PM  Virtual Visit via Video Note   I, Jannifer Rodney, connected with  Paula Stout  ([id number], 1956/07/11) on 01/29/23 at  6:15 PM EDT by a video-enabled telemedicine application and verified that I am speaking with the correct person using two identifiers.  Location: Patient: Virtual Visit Location Patient:  Home Provider: Virtual Visit Location Provider: Home Office   I discussed the limitations of evaluation and management by telemedicine and the availability of in person appointments. The patient expressed understanding and agreed to proceed.    History of Present Illness: Paula Stout is a 67 y.o. who identifies as a female who was assigned female at birth, and is being seen today for cough that started over a month ago. In April she was given doxycyline and prednisone that helped, but has returned. She is a diabetic and her last A1C 6.2.  HPI: Cough This is a new problem. The current episode started more than 1 month ago. The problem has been gradually worsening. The problem occurs every few minutes. The cough is Non-productive. Associated symptoms include headaches, myalgias, shortness of breath and wheezing. Pertinent negatives include no chills, ear congestion, ear pain, fever, nasal congestion or postnasal drip. She has tried rest for the symptoms. The treatment provided mild relief. Her past medical history is significant for asthma.    Problems:  Patient Active Problem List   Diagnosis Date Noted   Hypercoagulable state due to paroxysmal atrial fibrillation (HCC) 11/24/2022   Pericarditis 10/27/2022   Junctional tachycardia 11/05/2021   GERD (gastroesophageal reflux disease) 11/05/2021   Paroxysmal supraventricular tachycardia 11/05/2021   Capsulitis of left shoulder 10/20/2021   ACS (acute coronary syndrome) (HCC) 07/31/2021   Primary hypertension 04/06/2021   Mixed hyperlipidemia 04/06/2021   Hyperglycemia due to type 2 diabetes mellitus (HCC) 02/06/2021   Hyperosmolarity due to secondary diabetes mellitus (  HCC) 02/06/2021   Insomnia 02/06/2021   Kidney stone 02/06/2021   Polyneuropathy due to type 2 diabetes mellitus (HCC) 02/06/2021   Urinary tract obstruction 02/06/2021   Asthma without status asthmaticus 02/06/2021   Migraine without aura, not refractory 02/06/2021    Refractory migraine with aura 02/06/2021   Refractory migraine without aura 02/06/2021   Depressed bipolar I disorder (HCC) 02/06/2021   SVT (supraventricular tachycardia)    Paroxysmal atrial fibrillation (HCC)    Bipolar disorder (HCC)    Diabetes mellitus without complication (HCC)    Diabetes mellitus due to underlying condition with unspecified complications (HCC) 04/14/2020   Anxiety    Depression    Diabetes mellitus (HCC)    Irritable bowel syndrome (IBS)    Dyslipidemia 02/20/2020   Chronic anticoagulation 02/20/2020   Stage 3 chronic kidney disease (HCC) 02/20/2020   Chest pain 02/19/2020   Hypertriglyceridemia 02/18/2020   Depression, recurrent (HCC) 02/18/2020   Bipolar 1 disorder, depressed, moderate (HCC) 02/18/2020   GAD (generalized anxiety disorder) 02/18/2020   Chronic atrial fibrillation (HCC) 02/18/2020   At risk for falls 02/04/2019   Chronic migraine without aura, intractable, without status migrainosus 06/24/2017   History of atrial fibrillation 06/24/2017   Renal colic on left side 04/22/2017   Adhesive capsulitis of left shoulder 12/16/2016   Morbid obesity (HCC) 12/16/2016   History of posttraumatic stress disorder (PTSD) 10/07/2016   Colon cancer screening 04/15/2016   Heartburn 04/15/2016   Atrial fibrillation (HCC) 09/01/2014   Obesity (BMI 30-39.9) 09/01/2014   Asthma 09/01/2014   Unstable angina (HCC) 09/01/2014   HLD (hyperlipidemia) 09/01/2014   Type 2 diabetes mellitus with obesity (HCC) 10/11/2013   Migraines 10/11/2013   Bipolar 1 disorder (HCC) 10/11/2013   Chronic headache 10/11/2013   Dysmetabolic syndrome X 03/20/2008   Unspecified adverse effect of other drug, medicinal and biological substance(995.29) 02/17/2006   Chronic interstitial cystitis 12/28/2005   Pneumonia, organism unspecified(486) 11/21/2002    Allergies:  Allergies  Allergen Reactions   Bee Venom Anaphylaxis   Onion Other (See Comments)    Respiratory Distress    Tetanus-Diphtheria Toxoids Td Anaphylaxis   Sumatriptan     Passed out, nose bleed    Asa [Aspirin] Nausea And Vomiting   Iodine Rash   Nsaids Nausea Only    Rash, hives and trouble breathing   Sulfa Antibiotics Rash   Vancomycin Rash   Medications:  Current Outpatient Medications:    benzonatate (TESSALON PERLES) 100 MG capsule, Take 1 capsule (100 mg total) by mouth 3 (three) times daily as needed., Disp: 20 capsule, Rfl: 0   fluticasone (FLONASE) 50 MCG/ACT nasal spray, Place 2 sprays into both nostrils daily., Disp: 16 g, Rfl: 6   predniSONE (STERAPRED UNI-PAK 21 TAB) 10 MG (21) TBPK tablet, Use as directed, Disp: 21 tablet, Rfl: 0   acetaminophen (TYLENOL) 500 MG tablet, Take 1,000 mg by mouth every 6 (six) hours as needed for mild pain., Disp: , Rfl:    albuterol (VENTOLIN HFA) 108 (90 Base) MCG/ACT inhaler, Inhale 2 puffs into the lungs every 4 (four) hours as needed for wheezing., Disp: 1 each, Rfl: 2   apixaban (ELIQUIS) 5 MG TABS tablet, Take 1 tablet (5 mg total) by mouth 2 (two) times daily., Disp: 180 tablet, Rfl: 1   atorvastatin (LIPITOR) 80 MG tablet, Take 1 tablet (80 mg total) by mouth daily., Disp: 90 tablet, Rfl: 3   blood glucose meter kit and supplies, One Touch Ultra  Check blood sugars  once daily Dx: E11.9 (Patient taking differently: 1 each by Other route See admin instructions. One Touch Ultra  Check blood sugars once daily Dx: E11.9), Disp: 1 each, Rfl: 0   botulinum toxin Type A (BOTOX) 200 units injection, Inject 155 units IM into multiple site in the face,neck and head once every 90 days (Patient taking differently: Inject 200 Units into the muscle every 3 (three) months. Inject 155 units IM into multiple site in the face,neck and head once every 90 days), Disp: 1 each, Rfl: 4   buPROPion (WELLBUTRIN XL) 300 MG 24 hr tablet, TAKE 1 TABLET DAILY, Disp: 90 tablet, Rfl: 3   Cholecalciferol (VITAMIN D3) 125 MCG (5000 UT) capsule, Take 5,000 Units by mouth daily.,  Disp: , Rfl:    Cyanocobalamin (VITAMIN B-12) 5000 MCG TBDP, Take 5,000 mcg by mouth daily., Disp: , Rfl:    diltiazem (CARDIZEM) 30 MG tablet, Take 1 tablet every 4 hours AS NEEDED for AFIB or SVT heart rate >95 as long as top BP >100., Disp: 30 tablet, Rfl: 1   diphenhydrAMINE (BENADRYL) 25 MG tablet, Take 25 mg by mouth daily as needed for allergies (seasonal)., Disp: , Rfl:    EPINEPHrine 0.3 mg/0.3 mL IJ SOAJ injection, Inject 0.3 mg into the muscle as needed for anaphylaxis., Disp: 1 each, Rfl: 1   ezetimibe (ZETIA) 10 MG tablet, TAKE 1 TABLET DAILY, Disp: 90 tablet, Rfl: 3   fenofibrate 160 MG tablet, Take 1 tablet (160 mg total) by mouth daily., Disp: 90 tablet, Rfl: 3   levocetirizine (XYZAL) 5 MG tablet, TAKE 1 TABLET EVERY EVENING, Disp: 90 tablet, Rfl: 3   Lurasidone HCl 120 MG TABS, Take 1 tablet (120 mg total) by mouth at bedtime., Disp: 90 tablet, Rfl: 3   magnesium oxide (MAG-OX) 400 (240 Mg) MG tablet, Take 400 mg by mouth daily., Disp: , Rfl:    metFORMIN (GLUCOPHAGE-XR) 500 MG 24 hr tablet, Take 2 tablets (1,000 mg total) by mouth daily with breakfast., Disp: 180 tablet, Rfl: 0   metoprolol succinate (TOPROL-XL) 100 MG 24 hr tablet, Take 1 tablet (100 mg total) by mouth daily. Take with or immediately following a meal., Disp: 90 tablet, Rfl: 3   metoprolol tartrate (LOPRESSOR) 25 MG tablet, Take 1 tablet (25 mg total) by mouth every 4 (four) hours as needed (PALPITATIONS)., Disp: 90 tablet, Rfl: 3   nitroGLYCERIN (NITROSTAT) 0.4 MG SL tablet, Place 1 tablet (0.4 mg total) under the tongue every 5 (five) minutes as needed for chest pain., Disp: 25 tablet, Rfl: 11   ondansetron (ZOFRAN-ODT) 4 MG disintegrating tablet, Take 1 tablet (4 mg total) by mouth every 8 (eight) hours as needed for nausea or vomiting., Disp: 20 tablet, Rfl: 0   ONETOUCH VERIO test strip, USE DAILY TO CHECK BLOOD SUGAR, Disp: 100 strip, Rfl: 3   pantoprazole (PROTONIX) 40 MG tablet, Take 1 tablet (40 mg total)  by mouth daily., Disp: 90 tablet, Rfl: 3   QUEtiapine (SEROQUEL) 300 MG tablet, Take 1 tablet (300 mg total) by mouth at bedtime., Disp: 90 tablet, Rfl: 3   topiramate (TOPAMAX) 50 MG tablet, TAKE 3 TABLETS TWICE A DAY, Disp: 180 tablet, Rfl: 11  Observations/Objective: Patient is well-developed, well-nourished in no acute distress.  Resting comfortably  at home.  Head is normocephalic, atraumatic.  No labored breathing.  Speech is clear and coherent with logical content.  Patient is alert and oriented at baseline.  Tight nonproductive cough  Assessment and Plan: 1. Mild  intermittent asthma with exacerbation - predniSONE (STERAPRED UNI-PAK 21 TAB) 10 MG (21) TBPK tablet; Use as directed  Dispense: 21 tablet; Refill: 0 - benzonatate (TESSALON PERLES) 100 MG capsule; Take 1 capsule (100 mg total) by mouth 3 (three) times daily as needed.  Dispense: 20 capsule; Refill: 0 - fluticasone (FLONASE) 50 MCG/ACT nasal spray; Place 2 sprays into both nostrils daily.  Dispense: 16 g; Refill: 6  2. Type 2 diabetes mellitus with obesity (HCC)  Start prednisone and tessalon Continue albuterol as needed  Discuss with PCP about maintenance inhaler Continue xyzal Strict low carb diet  Follow up if symptoms worsen or do not improve   Follow Up Instructions: I discussed the assessment and treatment plan with the patient. The patient was provided an opportunity to ask questions and all were answered. The patient agreed with the plan and demonstrated an understanding of the instructions.  A copy of instructions were sent to the patient via MyChart unless otherwise noted below.    The patient was advised to call back or seek an in-person evaluation if the symptoms worsen or if the condition fails to improve as anticipated.  Time:  I spent 9 minutes with the patient via telehealth technology discussing the above problems/concerns.    Jannifer Rodney, FNP

## 2023-02-02 ENCOUNTER — Other Ambulatory Visit: Payer: Self-pay | Admitting: Family Medicine

## 2023-02-02 DIAGNOSIS — I480 Paroxysmal atrial fibrillation: Secondary | ICD-10-CM

## 2023-02-03 ENCOUNTER — Other Ambulatory Visit: Payer: Self-pay | Admitting: Family Medicine

## 2023-02-09 ENCOUNTER — Other Ambulatory Visit: Payer: Self-pay | Admitting: Family Medicine

## 2023-02-16 ENCOUNTER — Ambulatory Visit: Payer: Commercial Managed Care - HMO | Admitting: Cardiology

## 2023-02-18 ENCOUNTER — Ambulatory Visit (INDEPENDENT_AMBULATORY_CARE_PROVIDER_SITE_OTHER): Payer: Medicare (Managed Care) | Admitting: Neurology

## 2023-02-18 ENCOUNTER — Encounter: Payer: Self-pay | Admitting: Family Medicine

## 2023-02-18 ENCOUNTER — Other Ambulatory Visit: Payer: Self-pay | Admitting: Family Medicine

## 2023-02-18 DIAGNOSIS — G43709 Chronic migraine without aura, not intractable, without status migrainosus: Secondary | ICD-10-CM

## 2023-02-18 DIAGNOSIS — G43109 Migraine with aura, not intractable, without status migrainosus: Secondary | ICD-10-CM

## 2023-02-18 MED ORDER — ONABOTULINUMTOXINA 100 UNITS IJ SOLR
200.0000 [IU] | Freq: Once | INTRAMUSCULAR | Status: AC
Start: 2023-02-18 — End: 2023-02-18
  Administered 2023-02-18: 155 [IU] via INTRAMUSCULAR

## 2023-02-18 MED ORDER — TRULICITY 1.5 MG/0.5ML ~~LOC~~ SOAJ: 1.5000 mg | SUBCUTANEOUS | 2 refills | Status: DC

## 2023-02-18 MED ORDER — TRULICITY 0.75 MG/0.5ML ~~LOC~~ SOAJ: 0.7500 mg | SUBCUTANEOUS | 0 refills | Status: AC

## 2023-02-18 NOTE — Progress Notes (Signed)
[facility]  ° °Procedure Note Botox ° °Attending: Dr. Torrie Namba ° °Preoperative Diagnosis(es): Chronic migraine ° °Consent obtained from: The patient °Benefits discussed included, but were not limited to decreased muscle tightness, increased joint range of motion, and decreased pain.  Risk discussed included, but were not limited pain and discomfort, bleeding, bruising, excessive weakness, venous thrombosis, muscle atrophy and dysphagia.  Anticipated outcomes of the procedure as well as he risks and benefits of the alternatives to the procedure, and the roles and tasks of the personnel to be involved, were discussed with the patient, and the patient consents to the procedure and agrees to proceed. A copy of the patient medication guide was given to the patient which explains the blackbox warning. ° °Patients identity and treatment sites confirmed Yes.  . ° °Details of Procedure: °Skin was cleaned with alcohol. Prior to injection, the needle plunger was aspirated to make sure the needle was not within a blood vessel.  There was no blood retrieved on aspiration.   ° °Following is a summary of the muscles injected  And the amount of Botulinum toxin used: ° °Dilution °200 units of Botox was reconstituted with 4 ml of preservative free normal saline. °Time of reconstitution: At the time of the office visit (<30 minutes prior to injection)  ° °Injections  °155 total units of Botox was injected with a 30 gauge needle. ° °Injection Sites: °L occipitalis: 15 units- 3 sites  °R occiptalis: 15 units- 3 sites ° °L upper trapezius: 15 units- 3 sites °R upper trapezius: 15 units- 3 sits          °L paraspinal: 10 units- 2 sites °R paraspinal: 10 units- 2 sites ° °Face °L frontalis(2 injection sites):10 units   °R frontalis(2 injection sites):10 units         °L corrugator: 5 units   °R corrugator: 5 units           °Procerus: 5 units   °L temporalis: 20 units °R temporalis: 20 units  ° °Agent:  °200 units of botulinum Type  A (Onobotulinum Toxin type A) was reconstituted with 4 ml of preservative free normal saline.  °Time of reconstitution: At the time of the office visit (<30 minutes prior to injection)  ° ° ° Total injected (Units):  155 ° Total wasted (Units):  45 ° °Patient tolerated procedure well without complications.   °Reinjection is anticipated in 3 months. ° ° °

## 2023-02-21 ENCOUNTER — Ambulatory Visit (HOSPITAL_BASED_OUTPATIENT_CLINIC_OR_DEPARTMENT_OTHER)
Admission: RE | Admit: 2023-02-21 | Discharge: 2023-02-21 | Disposition: A | Discharge status: Home / Self Care | Payer: Medicare (Managed Care) | Source: Ambulatory Visit | Attending: Family Medicine | Admitting: Family Medicine

## 2023-02-21 ENCOUNTER — Encounter: Payer: Self-pay | Admitting: Cardiology

## 2023-02-21 ENCOUNTER — Encounter (HOSPITAL_BASED_OUTPATIENT_CLINIC_OR_DEPARTMENT_OTHER): Payer: Self-pay

## 2023-02-21 ENCOUNTER — Ambulatory Visit (INDEPENDENT_AMBULATORY_CARE_PROVIDER_SITE_OTHER): Payer: Medicare (Managed Care) | Admitting: Cardiology

## 2023-02-21 ENCOUNTER — Ambulatory Visit
Admission: RE | Admit: 2023-02-21 | Discharge: 2023-02-21 | Disposition: A | Discharge status: Home / Self Care | Payer: Medicare (Managed Care) | Source: Ambulatory Visit | Attending: Family Medicine | Admitting: Family Medicine

## 2023-02-21 VITALS — BP 104/66 | HR 68 | Ht 66.0 in | Wt 224.0 lb

## 2023-02-21 DIAGNOSIS — I471 Supraventricular tachycardia, unspecified: Secondary | ICD-10-CM

## 2023-02-21 DIAGNOSIS — E2839 Other primary ovarian failure: Secondary | ICD-10-CM | POA: Insufficient documentation

## 2023-02-21 DIAGNOSIS — I480 Paroxysmal atrial fibrillation: Secondary | ICD-10-CM | POA: Diagnosis not present

## 2023-02-21 DIAGNOSIS — D6869 Other thrombophilia: Secondary | ICD-10-CM

## 2023-02-21 DIAGNOSIS — Z1231 Encounter for screening mammogram for malignant neoplasm of breast: Secondary | ICD-10-CM | POA: Diagnosis not present

## 2023-02-21 DIAGNOSIS — Z780 Asymptomatic menopausal state: Secondary | ICD-10-CM | POA: Diagnosis not present

## 2023-02-21 MED ORDER — METOPROLOL SUCCINATE ER 100 MG PO TB24: 100.0000 mg | ORAL_TABLET | Freq: Two times a day (BID) | ORAL | 1 refills | Status: DC

## 2023-02-21 NOTE — Progress Notes (Signed)
  Electrophysiology Office Note:   Date:  02/21/2023  ID:  Paula Stout, DOB 09-Feb-1956, MRN [id number]  Primary Cardiologist: Gypsy Balsam, MD Electrophysiologist: Regan Lemming, MD      History of Present Illness:   Paula Stout is a 67 y.o. female with h/o atrial fibrillation and SVT seen today for routine electrophysiology followup.  Since last being seen in our clinic the patient reports doing okay.  She has not had any further episodes of atrial fibrillation since her ablation.  Ablation was 10/27/2022.  She has had further episodes of SVT.  During these episodes, she feels near syncopal.  During the EP study, SVT was not induced.  She has had multiple episodes since her ablation.  she denies chest pain, palpitations, dyspnea, PND, orthopnea, nausea, vomiting, dizziness, syncope, edema, weight gain, or early satiety.   Review of systems complete and found to be negative unless listed in HPI.   Studies Reviewed:    EKG is ordered today. Personal review shows sinus rhythm, rate 68   Risk Assessment/Calculations:    CHA2DS2-VASc Score = 3   This indicates a 3.2% annual risk of stroke. The patient's score is based upon: CHF History: 0 HTN History: 0 Diabetes History: 1 Stroke History: 0 Vascular Disease History: 0 Age Score: 1 Gender Score: 1             Physical Exam:   VS:  BP 104/66   Pulse 68   Ht 5\' 6"  (1.676 m)   Wt 224 lb (101.6 kg)   SpO2 97%   BMI 36.15 kg/m    Wt Readings from Last 3 Encounters:  02/21/23 224 lb (101.6 kg)  12/03/22 224 lb 6.4 oz (101.8 kg)  11/24/22 220 lb 9.6 oz (100.1 kg)     GEN: Well nourished, well developed in no acute distress NECK: No JVD; No carotid bruits CARDIAC: Regular rate and rhythm, no murmurs, rubs, gallops RESPIRATORY:  Clear to auscultation without rales, wheezing or rhonchi  ABDOMEN: Soft, non-tender, non-distended EXTREMITIES:  No edema; No deformity   ASSESSMENT AND PLAN:    1.  Paroxysmal  atrial fibrillation: Currently on metoprolol and Eliquis.  Status post ablation 10/27/2022.  She has had no further episodes of atrial fibrillation.  Naziyah Tieszen continue with current management.  2.  Secondary hypercoagulable state: Currently on Eliquis for atrial fibrillation  3.  SVT: Continues to have palpitations.  She had a negative EP study at the time of A-fib placement but this was done under general anesthesia.  She would prefer to avoid further medication management.  Due to that, we Karlye Ihrig plan for ablation.  Risk and benefits have been discussed.  This include bleeding, tamponade, heart block, MI, renal failure, death.  She understands these risks and is agreed to the procedure.  Follow up with Dr. Elberta Fortis in 6 months  Signed, Stephane Niemann Jorja Loa, MD

## 2023-02-21 NOTE — Patient Instructions (Addendum)
Medication Instructions:  Your physician has recommended you make the following change in your medication:  INCREASE Toprol to 100 mg twice daily  *If you need a refill on your cardiac medications before your next appointment, please call your pharmacy*   Lab Work: None ordered   Testing/Procedures: Your physician has recommended that you have an ablation. Catheter ablation is a medical procedure used to treat some cardiac arrhythmias (irregular heartbeats). During catheter ablation, a long, thin, flexible tube is put into a blood vessel in your groin (upper thigh), or neck. This tube is called an ablation catheter. It is then guided to your heart through the blood vessel. Radio frequency waves destroy small areas of heart tissue where abnormal heartbeats may cause an arrhythmia to start.   April, EP procedure scheduler, will be in touch to arrange this procedure.   Follow-Up: At [facility], you and your health needs are our priority.  As part of our continuing mission to provide you with exceptional heart care, we have created designated Provider Care Teams.  These Care Teams include your primary Cardiologist (physician) and Advanced Practice Providers (APPs -  Physician Assistants and Nurse Practitioners) who all work together to provide you with the care you need, when you need it.  Your next appointment:   To be   determined once ablation is scheduled  The format for your next appointment:   In Person  Provider:   Loman Brooklyn, MD    Thank you for choosing [facility]!!   Dory Horn, RN [phone]  Other Instructions

## 2023-02-22 ENCOUNTER — Ambulatory Visit: Payer: Medicare (Managed Care) | Attending: Cardiology | Admitting: Cardiology

## 2023-02-22 ENCOUNTER — Telehealth: Payer: Self-pay

## 2023-02-22 ENCOUNTER — Encounter: Payer: Self-pay | Admitting: Cardiology

## 2023-02-22 VITALS — BP 110/70 | HR 63 | Ht 66.0 in | Wt 225.0 lb

## 2023-02-22 DIAGNOSIS — Z8679 Personal history of other diseases of the circulatory system: Secondary | ICD-10-CM | POA: Diagnosis not present

## 2023-02-22 DIAGNOSIS — I480 Paroxysmal atrial fibrillation: Secondary | ICD-10-CM | POA: Diagnosis not present

## 2023-02-22 DIAGNOSIS — I471 Supraventricular tachycardia, unspecified: Secondary | ICD-10-CM | POA: Diagnosis not present

## 2023-02-22 DIAGNOSIS — E782 Mixed hyperlipidemia: Secondary | ICD-10-CM

## 2023-02-22 DIAGNOSIS — I10 Essential (primary) hypertension: Secondary | ICD-10-CM | POA: Diagnosis not present

## 2023-02-22 NOTE — Progress Notes (Unsigned)
Cardiology Office Note:    Date:  02/22/2023   ID:  Paula Stout, DOB 1956/03/04, MRN [id number]  PCP:  Sharlene Dory, DO  Cardiologist:  Gypsy Balsam, MD    Referring MD: Sharlene Dory*   Chief Complaint  Patient presents with   Fatigue    History of Present Illness:    Paula Stout is a 67 y.o. female past medical history significant for paroxysmal atrial fibrillation, status post atrial fibrillation ablation, recently discovered supraventricular tachycardia, dyslipidemia, type 2 diabetes.  Comes today to months for follow-up overall feeling poorly she has about 3 episode of SVT a week.  Anxiously waiting for her SVT ablation which was scheduled in about 2 weeks.  Denies have any chest pain tightness squeezing pressure mentions just weakness fatigue and tiredness  Past Medical History:  Diagnosis Date   A-fib [facility])    ACS (acute coronary syndrome) (HCC) 07/31/2021   Acute cystitis without hematuria 05/14/2017   Last Assessment & Plan:  Formatting of this note might be different from the original. - suprapubic tenderness with symptoms of urinary frequency and urgency and subjective fevers (no fevers recorded on any visit). Recently passed a renal stone, US shows left hydronephrosis which is improving.  - UA negative8/24 but pt is clinically symptomatic - will repeat UA  - start pt on Microbid for 5 days   Adhesive capsulitis of left shoulder 12/16/2016   Anxiety    Asthma without status asthmaticus 02/06/2021   At risk for falls 02/04/2019   Atrial fibrillation (HCC) 09/01/2014   Last Assessment & Plan:  Formatting of this note might be different from the original. A:  Chronic.  Sinus rhythm at this time.  States she was told this during admission at [facility] in the past. P:  On baby aspirin at home will continue.  Low dose metoprolol started.   Bipolar 1 disorder, depressed, moderate (HCC) 02/18/2020   Capsulitis of left shoulder 10/20/2021   Chronic  anticoagulation 02/20/2020   Chronic atrial fibrillation (HCC) 02/18/2020   Chronic headache 10/11/2013   Chronic interstitial cystitis 12/28/2005   Chronic migraine without aura, intractable, without status migrainosus 06/24/2017   Colon cancer screening 04/15/2016   Last Assessment & Plan:  Formatting of this note might be different from the original. Will schedule for colonoscopy   Depression, recurrent (HCC) 02/18/2020   Diabetes mellitus (HCC)    Dyslipidemia 02/20/2020   Dysmetabolic syndrome X 03/20/2008   GAD (generalized anxiety disorder) 02/18/2020   GERD (gastroesophageal reflux disease) 11/05/2021   Heartburn 04/15/2016   Last Assessment & Plan:  Formatting of this note might be different from the original. Patient was counseled regarding lifestyle modification  Advised to take Prilosec 30 mins before breakfast  Will schedule for EGD   History of atrial fibrillation 06/24/2017   History of posttraumatic stress disorder (PTSD) 10/07/2016   HLD (hyperlipidemia) 09/01/2014   Last Assessment & Plan:  Formatting of this note might be different from the original. A: Chronic. P: Continue statin.   Hyperosmolarity due to secondary diabetes mellitus (HCC) 02/06/2021   Hypertriglyceridemia 02/18/2020   Impaired mobility and activities of daily living 11/14/2015   Irritable bowel syndrome (IBS)    Junctional tachycardia 11/05/2021   Kidney stone 02/06/2021   Migraine without aura, not refractory 02/06/2021   Mixed hyperlipidemia 04/06/2021   Last Assessment & Plan:  Formatting of this note might be different from the original. Due for labs with PCP in November   Morbid obesity (  HCC) 12/16/2016   Paroxysmal supraventricular tachycardia 11/05/2021   Pneumonia, organism unspecified(486) 11/21/2002   Polyneuropathy due to type 2 diabetes mellitus (HCC) 02/06/2021   Primary hypertension 04/06/2021   Last Assessment & Plan:  Formatting of this note might be different from the original.  Controlled   Refractory migraine with aura 02/06/2021   Refractory migraine without aura 02/06/2021   Renal colic on left side 04/22/2017   Stage 3 chronic kidney disease (HCC) 02/20/2020   Unspecified adverse effect of other drug, medicinal and biological substance(995.29) 02/17/2006   Urinary tract obstruction 02/06/2021    Past Surgical History:  Procedure Laterality Date   ABDOMINAL HYSTERECTOMY     Fibroids   ATRIAL FIBRILLATION ABLATION     ATRIAL FIBRILLATION ABLATION N/A 10/27/2022   Procedure: ATRIAL FIBRILLATION ABLATION;  Surgeon: Regan Lemming, MD;  Location: [facility];  Service: Cardiovascular;  Laterality: N/A;   CARDIAC CATHETERIZATION     CARDIOVERSION     CESAREAN SECTION     KNEE SURGERY     LEFT HEART CATH AND CORONARY ANGIOGRAPHY N/A 03/04/2020   Procedure: LEFT HEART CATH AND CORONARY ANGIOGRAPHY;  Surgeon: Lyn Records, MD;  Location: [facility] INVAS[facility] CV LAB;  Service: Cardiovascular;  Laterality: N/A;   ROTATOR CUFF REPAIR      Current Medications: Current Meds  Medication Sig   acetaminophen (TYLENOL) 500 MG tablet Take 1,000 mg by mouth every 6 (six) hours as needed for mild pain.   albuterol (VENTOLIN HFA) 108 (90 Base) MCG/ACT inhaler Inhale 2 puffs into the lungs every 4 (four) hours as needed for wheezing.   atorvastatin (LIPITOR) 80 MG tablet TAKE 1 TABLET DAILY   blood glucose meter kit and supplies One Touch Ultra  Check 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 One Touch Ultra  Check blood sugars once daily Dx: E11.9)   botulinum toxin Type A (BOTOX) 200 units injection Inject 155 units IM into multiple site in the face,neck and head once every 90 days (Patient taking differently: Inject 200 Units into the muscle every 3 (three) months. Inject 155 units IM into multiple site in the face,neck and head once every 90 days)   buPROPion (WELLBUTRIN XL) 300 MG 24 hr tablet TAKE 1  TABLET DAILY   Cholecalciferol (VITAMIN D3) 125 MCG (5000 UT) capsule Take 5,000 Units by mouth daily.   Cyanocobalamin (VITAMIN B-12) 5000 MCG TBDP Take 5,000 mcg by mouth daily.   diltiazem (CARDIZEM) 30 MG tablet Take 1 tablet every 4 hours AS NEEDED for AFIB or SVT heart rate >95 as long as top BP >100. (Patient taking differently: Take 30 mg by mouth every 4 (four) hours as needed (see below). Take 1 tablet every 4 hours AS NEEDED for AFIB or SVT heart rate >95 as long as top BP >100.)   diphenhydrAMINE (BENADRYL) 25 MG tablet Take 25 mg by mouth daily as needed for allergies (seasonal).   Dulaglutide (TRULICITY) 0.75 MG/0.5ML SOPN Inject 0.75 mg into the skin once a week for 28 days.   [START ON 03/18/2023] Dulaglutide (TRULICITY) 1.5 MG/0.5ML SOPN Inject 1.5 mg into the skin once a week.   ELIQUIS 5 MG TABS tablet TAKE 1 TABLET TWICE A DAY   EPINEPHrine 0.3 mg/0.3 mL IJ SOAJ injection Inject 0.3 mg into the muscle as needed for anaphylaxis.   ezetimibe (ZETIA) 10 MG tablet TAKE 1 TABLET DAILY   fenofibrate 160 MG tablet  Take 1 tablet (160 mg total) by mouth daily.   fluticasone (FLONASE) 50 MCG/ACT nasal spray Place 2 sprays into both nostrils daily.   levocetirizine (XYZAL) 5 MG tablet TAKE 1 TABLET EVERY EVENING (Patient taking differently: Take 5 mg by mouth every evening.)   Lurasidone HCl 120 MG TABS Take 1 tablet (120 mg total) by mouth at bedtime.   magnesium oxide (MAG-OX) 400 (240 Mg) MG tablet Take 400 mg by mouth daily.   metoprolol succinate (TOPROL-XL) 100 MG 24 hr tablet Take 1 tablet (100 mg total) by mouth 2 (two) times daily. Take with or immediately following a meal.   metoprolol tartrate (LOPRESSOR) 25 MG tablet Take 1 tablet (25 mg total) by mouth every 4 (four) hours as needed (PALPITATIONS).   nitroGLYCERIN (NITROSTAT) 0.4 MG SL tablet Place 1 tablet (0.4 mg total) under the tongue every 5 (five) minutes as needed for chest pain.   ondansetron (ZOFRAN-ODT) 4 MG  disintegrating tablet Take 1 tablet (4 mg total) by mouth every 8 (eight) hours as needed for nausea or vomiting.   ONETOUCH VERIO test strip USE DAILY TO CHECK BLOOD SUGAR (Patient taking differently: 1 each by Other route as needed for other. Use daily to check blood sugar.)   pantoprazole (PROTONIX) 40 MG tablet Take 1 tablet (40 mg total) by mouth daily.   QUEtiapine (SEROQUEL) 300 MG tablet Take 1 tablet (300 mg total) by mouth at bedtime.   topiramate (TOPAMAX) 50 MG tablet TAKE 3 TABLETS TWICE A DAY (Patient taking differently: Take 75 mg by mouth 2 (two) times daily.)     Allergies:   Bee venom, Onion, Tetanus-diphtheria toxoids td, Sumatriptan, Asa [aspirin], Iodine, Nsaids, Sulfa antibiotics, and Vancomycin   Social History   Socioeconomic History   Marital status: Widowed    Spouse name: Not on file   Number of children: 6   Years of education: Not on file   Highest education level: Associate degree: academic program  Occupational History   Not on file  Tobacco Use   Smoking status: Never   Smokeless tobacco: Never   Tobacco comments:    Never smoke 07/15/22  Vaping Use   Vaping Use: Never used  Substance and Sexual Activity   Alcohol use: Never   Drug use: Never   Sexual activity: Not on file  Other Topics Concern   Not on file  Social History Narrative   Not on file   Social Determinants of Health   Financial Resource Strain: Medium Risk (02/16/2023)   Overall Financial Resource Strain (CARDIA)    Difficulty of Paying Living Expenses: Somewhat hard  Food Insecurity: No Food Insecurity (02/16/2023)   Hunger Vital Sign    Worried About Running Out of Food in the Last Year: Never true    Ran Out of Food in the Last Year: Never true  Transportation Needs: No Transportation Needs (02/16/2023)   PRAPARE - Administrator, Civil Service (Medical): No    Lack of Transportation (Non-Medical): No  Physical Activity: Unknown (02/16/2023)   Exercise Vital Sign     Days of Exercise per Week: 2 days    Minutes of Exercise per Session: Patient declined  Stress: Stress Concern Present (02/16/2023)   [employer] of Occupational Health - Occupational Stress Questionnaire    Feeling of Stress : To some extent  Social Connections: Socially Isolated (02/16/2023)   Social Connection and Isolation Panel [NHANES]    Frequency of Communication with Friends and Family: More  than three times a week    Frequency of Social Gatherings with Friends and Family: Never    Attends Religious Services: Never    Database administrator or Organizations: No    Attends Engineer, structural: Not on file    Marital Status: Widowed     Family History: The patient's family history includes Bipolar disorder in her daughter and son; Migraines in her daughter, daughter, and son. She was adopted. ROS:   Please see the history of present illness.    All 14 point review of systems negative except as described per history of present illness  EKGs/Labs/Other Studies Reviewed:      Recent Labs: 07/08/2022: B Natriuretic Peptide 156.8; Magnesium 1.7; TSH 1.272 10/13/2022: Hemoglobin 12.1; Platelets 293 11/22/2022: ALT 24; BUN 17; Creatinine, Ser 1.92; Potassium 4.4; Sodium 140  Recent Lipid Panel    Component Value Date/Time   CHOL 136 11/22/2022 1450   CHOL 141 02/10/2021 0914   TRIG 172.0 (H) 11/22/2022 1450   HDL 34.30 (L) 11/22/2022 1450   HDL 26 (L) 02/10/2021 0914   CHOLHDL 4 11/22/2022 1450   VLDL 34.4 11/22/2022 1450   LDLCALC 67 11/22/2022 1450   LDLCALC 86 02/10/2021 0914    Physical Exam:    VS:  BP 110/70 (BP Location: Left Arm, Patient Position: Sitting)   Pulse 63   Ht 5\' 6"  (1.676 m)   Wt 225 lb (102.1 kg)   SpO2 96%   BMI 36.32 kg/m     Wt Readings from Last 3 Encounters:  02/22/23 225 lb (102.1 kg)  02/21/23 224 lb (101.6 kg)  12/03/22 224 lb 6.4 oz (101.8 kg)     GEN:  Well nourished, well developed in no acute distress HEENT:  Normal NECK: No JVD; No carotid bruits LYMPHATICS: No lymphadenopathy CARDIAC: RRR, no murmurs, no rubs, no gallops RESPIRATORY:  Clear to auscultation without rales, wheezing or rhonchi  ABDOMEN: Soft, non-tender, non-distended MUSCULOSKELETAL:  No edema; No deformity  SKIN: Warm and dry LOWER EXTREMITIES: no swelling NEUROLOGIC:  Alert and oriented x 3 PSYCHIATRIC:  Normal affect   ASSESSMENT:    1. SVT (supraventricular tachycardia)   2. Mixed hyperlipidemia   3. History of atrial fibrillation   4. Primary hypertension    PLAN:    In order of problems listed above:  Supraventricular tachycardia: Scheduled to have ablation done at the beginning of next month.  Continue present management. Mixed dyslipidemia on appropriate guideline directed medical therapy with LDL of 67 HDL 34 we will continue present management. History of atrial fibrillation ablation.  Anticoagulant which I will continue for now. Essential hypertension blood pressure slightly low continue present management   Medication Adjustments/Labs and Tests Ordered: Current medicines are reviewed at length with the patient today.  Concerns regarding medicines are outlined above.  No orders of the defined types were placed in this encounter.  Medication changes: No orders of the defined types were placed in this encounter.   Signed, Georgeanna Lea, MD, [facility] 02/22/2023 2:16 PM    [facility]

## 2023-02-22 NOTE — Patient Instructions (Addendum)

## 2023-02-22 NOTE — Telephone Encounter (Signed)
Pt is scheduled for an SVT Ablation with Dr. Elberta Fortis on 03/08/23 at 3:30 PM. She will go to [facility] in [facility] this week for labs.  Instruction letter has been sent via MyChart per pt's request.

## 2023-02-23 ENCOUNTER — Ambulatory Visit: Payer: Medicare (Managed Care) | Admitting: Family Medicine

## 2023-02-23 ENCOUNTER — Ambulatory Visit (INDEPENDENT_AMBULATORY_CARE_PROVIDER_SITE_OTHER): Payer: Medicare (Managed Care) | Admitting: Behavioral Health

## 2023-02-23 ENCOUNTER — Encounter: Payer: Self-pay | Admitting: Behavioral Health

## 2023-02-23 DIAGNOSIS — F3181 Bipolar II disorder: Secondary | ICD-10-CM | POA: Diagnosis not present

## 2023-02-23 DIAGNOSIS — F339 Major depressive disorder, recurrent, unspecified: Secondary | ICD-10-CM

## 2023-02-23 LAB — BASIC METABOLIC PANEL
BUN/Creatinine Ratio: 10 — ABNORMAL LOW (ref 12–28)
BUN: 17 mg/dL (ref 8–27)
CO2: 19 mmol/L — ABNORMAL LOW (ref 20–29)
Calcium: 9.5 mg/dL (ref 8.7–10.3)
Chloride: 104 mmol/L (ref 96–106)
Creatinine, Ser: 1.7 mg/dL — ABNORMAL HIGH (ref 0.57–1.00)
Glucose: 455 mg/dL — ABNORMAL HIGH (ref 70–99)
Potassium: 4.3 mmol/L (ref 3.5–5.2)
Sodium: 137 mmol/L (ref 134–144)
eGFR: 33 mL/min/{1.73_m2} — ABNORMAL LOW (ref >59)

## 2023-02-23 LAB — CBC
Hematocrit: 36.4 % (ref 34.0–46.6)
Hemoglobin: 11.7 g/dL (ref 11.1–15.9)
MCH: 30.6 pg (ref 26.6–33.0)
MCHC: 32.1 g/dL (ref 31.5–35.7)
MCV: 95 fL (ref 79–97)
Platelets: 252 10*3/uL (ref 150–450)
RBC: 3.82 x10E6/uL (ref 3.77–5.28)
RDW: 13.1 % (ref 11.7–15.4)
WBC: 7.5 10*3/uL (ref 3.4–10.8)
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
## 2023-02-23 NOTE — Progress Notes (Signed)
                Aris Moman M Coben Godshall, LCMHC 

## 2023-02-24 ENCOUNTER — Other Ambulatory Visit: Payer: Medicare (Managed Care) | Admitting: Pharmacist

## 2023-02-24 DIAGNOSIS — E1165 Type 2 diabetes mellitus with hyperglycemia: Secondary | ICD-10-CM

## 2023-02-24 NOTE — Progress Notes (Signed)
02/24/2023 Name: Paula Stout MRN: [id number] DOB: [birth year]  Chief Complaint  Patient presents with   Medication Management   Diabetes    Paula Stout is a 67 y.o. year old female who presented for a telephone visit.   They were referred to the pharmacist by their PCP for assistance in managing medication access and complex medication management.   Subjective: Had phone visit with patient 01/11/2023. We applied for Extra Help from Medicare. Patient reports she has not received letter from [employer] indicating if she was approved or denied. She does acknowledge that copays for her medications has decreased recently. Vereified with [facility] and some meds have been $0, $4 or $11.20. Based on these copays I am fairly certain that she has been approved for LIS / Extra Help.   Patient reports affordability concerns with their medications: Yes  - improved over the last month Patient reports access/transportation concerns to their pharmacy: No  Patient reports adherence concerns with their medications:  Yes  - has had difficulty getting Trulicity consistently due to supply issues.     Type 2 DM:  Current therapy - metformin ER 500mg  - take 2 tablets daily. Will start Trulicity 0.75mg  weekly once arrives from [facility]. Filled 02/18/2023 but per representative they are mailing out today. Patient will increase to 1.5mg  weekly after 4 weeks.   Previous medications tried: Ozempic - stopped due to nausea.   Patient had been in [place] visiting her daughter for several weeks in May. During that time she could not get Trulicity so she tried Ozempic. She had a lot of nausea with even 0.25mg  of Ozempic. She was without any diabetic medication for a few weeks. She restarted metformin around 01/27/2023.   Blood glucose checked by cardio with BMET 02/22/2023 was 455.  Home blood glucose today was better at 220.   Patient does report blurry vision when she is on her  computer. Also reports polydipsia and polyuria.    Objective:  Lab Results  Component Value Date   HGBA1C 6.2 11/22/2022    Lab Results  Component Value Date   CREATININE 1.70 (H) 02/22/2023   BUN 17 02/22/2023   NA 137 02/22/2023   K 4.3 02/22/2023   CL 104 02/22/2023   CO2 19 (L) 02/22/2023    Lab Results  Component Value Date   CHOL 136 11/22/2022   HDL 34.30 (L) 11/22/2022   LDLCALC 67 11/22/2022   TRIG 172.0 (H) 11/22/2022   CHOLHDL 4 11/22/2022    Medications Reviewed Today     Reviewed by Henrene Pastor, RPH-CPP (Pharmacist) on 02/24/23 at 1507  Med List Status: <None>   Medication Order Taking? Sig Documenting Provider Last Dose Status Informant  acetaminophen (TYLENOL) 500 MG tablet [id number] Yes Take 1,000 mg by mouth every 6 (six) hours as needed for mild pain. [provider] Taking Active Self  albuterol (VENTOLIN HFA) 108 (90 Base) MCG/ACT inhaler [id number] Yes Inhale 2 puffs into the lungs every 4 (four) hours as needed for wheezing. Sharlene Dory, DO Taking Active Self  atorvastatin (LIPITOR) 80 MG tablet [id number] Yes TAKE 1 TABLET DAILY Sharlene Dory, DO Taking Active   blood glucose meter kit and supplies [id number] Yes One Touch Ultra  Check blood sugars once daily Dx: E11.9  Patient taking differently: 1 each by Other route See admin instructions. One Touch Ultra  Check blood sugars once daily Dx: E11.9   Sharlene Dory, DO Taking Active Self  botulinum  toxin Type A (BOTOX) 200 units injection [id number] Yes Inject 155 units IM into multiple site in the face,neck and head once every 90 days  Patient taking differently: Inject 200 Units into the muscle every 3 (three) months. Inject 155 units IM into multiple site in the face,neck and head once every 90 days   Drema Dallas, DO Taking Active Self  buPROPion (WELLBUTRIN XL) 300 MG 24 hr tablet [id number] Yes TAKE 1 TABLET DAILY Sharlene Dory, DO Taking Active    Cholecalciferol (VITAMIN D3) 125 MCG (5000 UT) capsule [id number] Yes Take 5,000 Units by mouth daily. [provider] Taking Active Self  Cyanocobalamin (VITAMIN B-12) 5000 MCG TBDP [id number] Yes Take 5,000 mcg by mouth daily. [provider] Taking Active Self  diltiazem (CARDIZEM) 30 MG tablet [id number] Yes Take 1 tablet every 4 hours AS NEEDED for AFIB or SVT heart rate >95 as long as top BP >100.  Patient taking differently: Take 30 mg by mouth every 4 (four) hours as needed (see below). Take 1 tablet every 4 hours AS NEEDED for AFIB or SVT heart rate >95 as long as top BP >100.   Sherie Don, NP Taking Active   diphenhydrAMINE (BENADRYL) 25 MG tablet [id number] Yes Take 25 mg by mouth daily as needed for allergies (seasonal). [provider] Taking Active Self           Med Note Nedra Hai   Fri Dec 03, 2022 10:47 AM)    Dulaglutide (TRULICITY) 0.75 MG/0.5ML SOPN [id number] Yes Inject 0.75 mg into the skin once a week for 28 days. Sharlene Dory, DO Taking Active   Dulaglutide (TRULICITY) 1.5 MG/0.5ML Namon Cirri [id number] Yes Inject 1.5 mg into the skin once a week. Sharlene Dory, DO Taking Active   ELIQUIS 5 MG TABS tablet [id number] Yes TAKE 1 TABLET TWICE A DAY Sharlene Dory, DO Taking Active   EPINEPHrine 0.3 mg/0.3 mL IJ SOAJ injection [id number] Yes Inject 0.3 mg into the muscle as needed for anaphylaxis. Sharlene Dory, DO Taking Active Self  ezetimibe (ZETIA) 10 MG tablet [id number] Yes TAKE 1 TABLET DAILY Sharlene Dory, DO Taking Active   fenofibrate 160 MG tablet [id number] Yes Take 1 tablet (160 mg total) by mouth daily. Sharlene Dory, DO Taking Active   fluticasone (FLONASE) 50 MCG/ACT nasal spray [id number] Yes Place 2 sprays into both nostrils daily. Junie Spencer, FNP Taking Active   levocetirizine (XYZAL) 5 MG tablet [id number] Yes TAKE 1 TABLET EVERY EVENING  Patient taking differently: Take 5  mg by mouth every evening.   Sharlene Dory, DO Taking Active   Lurasidone HCl 120 MG TABS [id number] Yes Take 1 tablet (120 mg total) by mouth at bedtime. Sharlene Dory, DO Taking Active Self  magnesium oxide (MAG-OX) 400 (240 Mg) MG tablet [id number] Yes Take 400 mg by mouth daily. [provider] Taking Active Self  metFORMIN (GLUCOPHAGE-XR) 500 MG 24 hr tablet [id number] Yes Take 1,000 mg by mouth daily with breakfast. [provider]  Active   metoprolol succinate (TOPROL-XL) 100 MG 24 hr tablet [id number] Yes Take 1 tablet (100 mg total) by mouth 2 (two) times daily. Take with or immediately following a meal. Camnitz, Will Daphine Deutscher, MD Taking Active   metoprolol tartrate (LOPRESSOR) 25 MG tablet [id number] Yes Take 1 tablet (25 mg total) by mouth every 4 (four) hours as needed (PALPITATIONS). Regan Lemming, MD Taking Active Self  nitroGLYCERIN (NITROSTAT) 0.4 MG SL  tablet [id number] Yes Place 1 tablet (0.4 mg total) under the tongue every 5 (five) minutes as needed for chest pain. Georgeanna Lea, MD Taking Active Self  ondansetron (ZOFRAN-ODT) 4 MG disintegrating tablet [id number] Yes Take 1 tablet (4 mg total) by mouth every 8 (eight) hours as needed for nausea or vomiting. Sharlene Dory, DO Taking Active   [facility] VERIO test strip [id number] Yes USE DAILY TO CHECK BLOOD SUGAR  Patient taking differently: 1 each by Other route as needed for other. Use daily to check blood sugar.   Sharlene Dory, DO Taking Active   pantoprazole (PROTONIX) 40 MG tablet [id number] Yes Take 1 tablet (40 mg total) by mouth daily. Sharlene Dory, DO Taking Active   QUEtiapine (SEROQUEL) 300 MG tablet [id number] Yes Take 1 tablet (300 mg total) by mouth at bedtime. Sharlene Dory, DO Taking Active Self  topiramate (TOPAMAX) 50 MG tablet [id number] Yes TAKE 3 TABLETS TWICE A DAY  Patient taking differently: Take 75 mg by mouth 2 (two) times  daily.   Sharlene Dory, DO Taking Active               Assessment/Plan:  Medication Management -  - Approved for LIS/ Extra Help - Most recently copay for Trulicity was $0; lurasidone was $4.50 and Eliquis was $11.20 -  Will check with PCP about diabetes medication adjustment. Last eGFR was 33 so would be tricky to increase metformin to 2000mg  / day. Could add SGLT2 - Jardiance or Farxiga, insulin or sulfonylurea like glimepiride until Trulicity restarted / blood glucose improves - patient will follow up with PCP Monday June 24th.  - Checked for possible migraine treatments at last visit.   Nurtec is on formulary - tier 3 $11.20 / month - can only get #16 / 30 days (taken every other day)  Ajovy is tier 3 - $11.20 / month   Follow Up Plan: June 2024  Henrene Pastor, PharmD Clinical Pharmacist [facility]

## 2023-02-25 ENCOUNTER — Encounter: Payer: Self-pay | Admitting: Cardiology

## 2023-02-25 MED ORDER — DAPAGLIFLOZIN PROPANEDIOL 10 MG PO TABS: 10.0000 mg | ORAL_TABLET | Freq: Every day | ORAL | 1 refills | Status: DC

## 2023-02-25 NOTE — Progress Notes (Addendum)
02/25/2023 Addendum:  Dr. Danne Harbor trial of either [place] or Marcelline Deist if cost was not too much. Patient does have LIS / Extra help now so cost of either would be $11.20. Patient is OK with that cost. Sent Farxiga 10mg  daily to slow CKD progression. Might also get a little decreased in blood glucose but not a large effect when eGFR is < 45. Also provided 1 time 30 days free coupon to use for first [place] fill.

## 2023-02-25 NOTE — Addendum Note (Signed)
Addended by: Henrene Pastor B on: 02/25/2023 12:53 PM   Modules accepted: Orders

## 2023-02-27 NOTE — Pre-Procedure Instructions (Signed)
Attempted to call patient regarding procedure on Tuesday 7/2.  Your procedure is scheduled with anesthesia.  Anesthesia requires some medications to be held.  Left voicemail on the following.  Trulicity- Don't take after 6/24 until after procedure.  Farixga- last dose 6/28.

## 2023-02-28 ENCOUNTER — Ambulatory Visit (INDEPENDENT_AMBULATORY_CARE_PROVIDER_SITE_OTHER): Payer: Medicare (Managed Care) | Admitting: Family Medicine

## 2023-02-28 ENCOUNTER — Encounter: Payer: Self-pay | Admitting: Family Medicine

## 2023-02-28 ENCOUNTER — Other Ambulatory Visit (HOSPITAL_BASED_OUTPATIENT_CLINIC_OR_DEPARTMENT_OTHER): Payer: Self-pay

## 2023-02-28 ENCOUNTER — Other Ambulatory Visit: Payer: Self-pay

## 2023-02-28 VITALS — BP 128/80 | HR 51 | Temp 97.9°F | Ht 66.0 in | Wt 221.5 lb

## 2023-02-28 DIAGNOSIS — I480 Paroxysmal atrial fibrillation: Secondary | ICD-10-CM

## 2023-02-28 DIAGNOSIS — E1165 Type 2 diabetes mellitus with hyperglycemia: Secondary | ICD-10-CM | POA: Diagnosis not present

## 2023-02-28 DIAGNOSIS — Z7984 Long term (current) use of oral hypoglycemic drugs: Secondary | ICD-10-CM | POA: Diagnosis not present

## 2023-02-28 DIAGNOSIS — Z7985 Long-term (current) use of injectable non-insulin antidiabetic drugs: Secondary | ICD-10-CM | POA: Diagnosis not present

## 2023-02-28 MED ORDER — TRULICITY 0.75 MG/0.5ML ~~LOC~~ SOAJ
0.7500 mg | SUBCUTANEOUS | 0 refills | Status: DC
  Filled 2023-02-28 (×2): qty 2, 28d supply, fill #0

## 2023-02-28 MED ORDER — TRULICITY 1.5 MG/0.5ML ~~LOC~~ SOAJ
1.5000 mg | SUBCUTANEOUS | 1 refills | Status: DC
  Filled 2023-02-28: qty 2, 28d supply, fill #0

## 2023-02-28 NOTE — Progress Notes (Signed)
Subjective:   Chief Complaint  Patient presents with   Follow-up    Lab results    Paula Stout is a 67 y.o. female here for follow-up of diabetes.   Paula Stout's self monitored glucose range is high 100's.  Patient denies hypoglycemic reactions. She checks her glucose levels 1 time(s) per day. Patient does not require insulin.   Medications include: Farxiga 10 mg/d, metformin 1000 mg bid Diet is improving.  Exercise: none No Cp or SOB.   Past Medical History:  Diagnosis Date   A-fib [facility])    ACS (acute coronary syndrome) (HCC) 07/31/2021   Acute cystitis without hematuria 05/14/2017   Last Assessment & Plan:  Formatting of this note might be different from the original. - suprapubic tenderness with symptoms of urinary frequency and urgency and subjective fevers (no fevers recorded on any visit). Recently passed a renal stone, US shows left hydronephrosis which is improving.  - UA negative8/24 but pt is clinically symptomatic - will repeat UA  - start pt on Microbid for 5 days   Adhesive capsulitis of left shoulder 12/16/2016   Anxiety    Asthma without status asthmaticus 02/06/2021   At risk for falls 02/04/2019   Atrial fibrillation (HCC) 09/01/2014   Last Assessment & Plan:  Formatting of this note might be different from the original. A:  Chronic.  Sinus rhythm at this time.  States she was told this during admission at [facility] in the past. P:  On baby aspirin at home will continue.  Low dose metoprolol started.   Bipolar 1 disorder, depressed, moderate (HCC) 02/18/2020   Capsulitis of left shoulder 10/20/2021   Chronic anticoagulation 02/20/2020   Chronic atrial fibrillation (HCC) 02/18/2020   Chronic headache 10/11/2013   Chronic interstitial cystitis 12/28/2005   Chronic migraine without aura, intractable, without status migrainosus 06/24/2017   Colon cancer screening 04/15/2016   Last Assessment & Plan:  Formatting of this note might be different from the original. Will  schedule for colonoscopy   Depression, recurrent (HCC) 02/18/2020   Diabetes mellitus (HCC)    Dyslipidemia 02/20/2020   Dysmetabolic syndrome X 03/20/2008   GAD (generalized anxiety disorder) 02/18/2020   GERD (gastroesophageal reflux disease) 11/05/2021   Heartburn 04/15/2016   Last Assessment & Plan:  Formatting of this note might be different from the original. Patient was counseled regarding lifestyle modification  Advised to take Prilosec 30 mins before breakfast  Will schedule for EGD   History of atrial fibrillation 06/24/2017   History of posttraumatic stress disorder (PTSD) 10/07/2016   HLD (hyperlipidemia) 09/01/2014   Last Assessment & Plan:  Formatting of this note might be different from the original. A: Chronic. P: Continue statin.   Hyperosmolarity due to secondary diabetes mellitus (HCC) 02/06/2021   Hypertriglyceridemia 02/18/2020   Impaired mobility and activities of daily living 11/14/2015   Irritable bowel syndrome (IBS)    Junctional tachycardia 11/05/2021   Kidney stone 02/06/2021   Migraine without aura, not refractory 02/06/2021   Mixed hyperlipidemia 04/06/2021   Last Assessment & Plan:  Formatting of this note might be different from the original. 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 Controlled   Refractory migraine with aura  02/06/2021   Refractory migraine without aura 02/06/2021   Renal colic on left side 04/22/2017   Stage 3 chronic kidney disease (HCC) 02/20/2020   Unspecified adverse effect of other drug, medicinal and biological substance(995.29) 02/17/2006   Urinary tract obstruction 02/06/2021     Related testing: Retinal exam:  Due Pneumovax: done  Objective:  BP 128/80 (BP Location: Left Arm, Patient Position: Sitting, Cuff Size: Large)   Pulse (!) 51   Temp 97.9 F (36.6 C) (Oral)   Ht 5\' 6"  (1.676 m)   Wt 221 lb 8 oz (100.5 kg)   SpO2 94%   BMI 35.75 kg/m  General:  Well developed, well nourished, in no apparent distress Lungs:  CTAB, no access msc use Cardio:  RRR, no bruits, no LE edema Psych: Age appropriate judgment and insight  Assessment:   Type 2 diabetes mellitus with hyperglycemia, without long-term current use of insulin (HCC) - Plan: Microalbumin / creatinine urine ratio, Basic metabolic panel   Plan:   Chronic, unstable.  Check above today.  Sugars are not where he wants them to be just yet.  Continue metformin 1000 mg twice daily, Farxiga 10 mg daily.  I am not sure that insurance will continue to cover this.  I will reach out to our pharmacy team to see if she can help.  Will order Trulicity 0.75 mg weekly for the next 4 weeks and then plan to have her resume 1.5 mg weekly.  She will send me some sugar readings in 6 weeks and we will decide if we need to increase the Trulicity dosage to 3 mg weekly.  I would like to keep her on some sort of SGLT2 inhibitor but cost may be prohibitive.  Counseled on diet and exercise. F/u in 3 mo. The patient voiced understanding and agreement to the plan.  Jilda Roche [place], DO 02/28/23 2:57 PM

## 2023-02-28 NOTE — Patient Instructions (Addendum)
Give [place] 2-3 business days to get the results of your labs back.   We are restarting your Trulicity.  Keep the diet clean and stay active.  Send me a message in 6 weeks with some sugar readings.   Let us know if you need anything.

## 2023-03-01 DIAGNOSIS — I129 Hypertensive chronic kidney disease with stage 1 through stage 4 chronic kidney disease, or unspecified chronic kidney disease: Secondary | ICD-10-CM | POA: Diagnosis not present

## 2023-03-01 DIAGNOSIS — N183 Chronic kidney disease, stage 3 unspecified: Secondary | ICD-10-CM | POA: Diagnosis not present

## 2023-03-01 DIAGNOSIS — R809 Proteinuria, unspecified: Secondary | ICD-10-CM | POA: Diagnosis not present

## 2023-03-01 LAB — BASIC METABOLIC PANEL
BUN: 29 mg/dL — ABNORMAL HIGH (ref 6–23)
CO2: 21 mEq/L (ref 19–32)
Calcium: 10.3 mg/dL (ref 8.4–10.5)
Chloride: 105 mEq/L (ref 96–112)
Creatinine, Ser: 2.28 mg/dL — ABNORMAL HIGH (ref 0.40–1.20)
GFR: 22 mL/min — ABNORMAL LOW (ref >60.00)
Glucose, Bld: 178 mg/dL — ABNORMAL HIGH (ref 70–99)
Potassium: 4.4 mEq/L (ref 3.5–5.1)
Sodium: 138 mEq/L (ref 135–145)

## 2023-03-01 LAB — MICROALBUMIN / CREATININE URINE RATIO
Creatinine,U: 183.9 mg/dL
Microalb Creat Ratio: 0.5 mg/g (ref 0.0–30.0)
Microalb, Ur: 0.9 mg/dL (ref 0.0–1.9)

## 2023-03-03 ENCOUNTER — Other Ambulatory Visit: Payer: Self-pay | Admitting: Pharmacist

## 2023-03-03 NOTE — Progress Notes (Signed)
03/03/2023 Name: Paula Stout MRN: [id number] DOB: 05-25-1956  Chief Complaint  Patient presents with   Medication Management    Cost of Marcelline Deist    Lashala Laser is a 67 y.o. year old female. Received message from her PCP, Dr Carmelia Roller that he was getting fax that her Marcelline Deist needed prior authorization.    They were referred to the pharmacist by their PCP for assistance in managing medication access and complex medication management.   Subjective: Patient has  been approved for Extra Help from Medicare. Medications are now $0, $4 or $11.20.  We started [place] last week to help with DM and CKD.   Patient reports affordability concerns with their medications: Yes  - improved over the last month Patient reports access/transportation concerns to their pharmacy: No  Patient reports adherence concerns with their medications:  Yes  - has had difficulty getting Trulicity consistently due to supply issues.     Type 2 DM:  Current therapy - metformin ER 500mg  - take 2 tablets daily. Trulicity 0.75mg  weekly, patient will increase to 1.5mg  weekly after 4 weeks. Farxiga 10mg  daily started 02/25/2023.   Previous medications tried: Ozempic - stopped due to nausea.   Patient had been in [place] visiting her daughter for several weeks in May. During that time she could not get Trulicity so she tried Ozempic. She had a lot of nausea with even 0.25mg  of Ozempic. She was without any diabetic medication for a few weeks. She restarted metformin around 01/27/2023.   Blood glucose checked by cardio with BMET 02/22/2023 was 455. Dr Carmelia Roller checked 03/01/2023 and blood glucose was 178  CKD - Serum creatinine and eGFR was worsened from 06/18 to 06/25.  Patient started Farxiga 10mg  daily 02/25/2023.   Objective:  Lab Results  Component Value Date   HGBA1C 6.2 11/22/2022    Lab Results  Component Value Date   CREATININE 2.28 (H) 02/28/2023   BUN 29 (H) 02/28/2023   NA 138 02/28/2023    K 4.4 02/28/2023   CL 105 02/28/2023   CO2 21 02/28/2023    Lab Results  Component Value Date   CHOL 136 11/22/2022   HDL 34.30 (L) 11/22/2022   LDLCALC 67 11/22/2022   TRIG 172.0 (H) 11/22/2022   CHOLHDL 4 11/22/2022    Medications Reviewed Today     Reviewed by Sharlene Dory, DO (Physician) on 02/28/23 at 1455  Med List Status: <None>   Medication Order Taking? Sig Documenting Provider Last Dose Status Informant  acetaminophen (TYLENOL) 500 MG tablet [id number] Yes Take 1,000 mg by mouth every 6 (six) hours as needed for mild pain. [provider] Taking Active Self  albuterol (VENTOLIN HFA) 108 (90 Base) MCG/ACT inhaler [id number] Yes Inhale 2 puffs into the lungs every 4 (four) hours as needed for wheezing. Sharlene Dory, DO Taking Active Self  atorvastatin (LIPITOR) 80 MG tablet [id number] Yes TAKE 1 TABLET DAILY Sharlene Dory, DO Taking Active   blood glucose meter kit and supplies [id number] Yes One Touch Ultra  Check blood sugars once daily Dx: E11.9  Patient taking differently: 1 each by Other route See admin instructions. One Touch Ultra  Check blood sugars once daily Dx: E11.9   Sharlene Dory, DO Taking Active Self  botulinum toxin Type A (BOTOX) 200 units injection [id number] Yes Inject 155 units IM into multiple site in the face,neck and head once every 90 days  Patient taking differently: Inject 200 Units into the muscle every 3 (  three) months. Inject 155 units IM into multiple site in the face,neck and head once every 90 days   Drema Dallas, DO Taking Active Self  buPROPion (WELLBUTRIN XL) 300 MG 24 hr tablet [id number] Yes TAKE 1 TABLET DAILY Sharlene Dory, DO Taking Active   Cholecalciferol (VITAMIN D3) 125 MCG (5000 UT) capsule [id number] Yes Take 5,000 Units by mouth daily. [provider] Taking Active Self  Cyanocobalamin (VITAMIN B-12) 5000 MCG TBDP [id number] Yes Take 5,000 mcg by mouth daily. [provider] Taking Active Self  dapagliflozin propanediol (FARXIGA) 10 MG TABS tablet [id number] Yes Take 1 tablet (10 mg total) by mouth daily. Sharlene Dory, DO Taking Active   diltiazem (CARDIZEM) 30 MG tablet [id number] Yes Take 1 tablet every 4 hours AS NEEDED for AFIB or SVT heart rate >95 as long as top BP >100.  Patient taking differently: Take 30 mg by mouth every 4 (four) hours as needed (see below). Take 1 tablet every 4 hours AS NEEDED for AFIB or SVT heart rate >95 as long as top BP >100.   Sherie Don, NP Taking Active   diphenhydrAMINE (BENADRYL) 25 MG tablet [id number] Yes Take 25 mg by mouth daily as needed for allergies (seasonal). [provider] Taking Active Self           Med Note Nedra Hai   Fri Dec 03, 2022 10:47 AM)    Dulaglutide (TRULICITY) 0.75 MG/0.5ML SOPN [id number] Yes Inject 0.75 mg into the skin once a week for 28 days. Sharlene Dory, DO Taking Active   Dulaglutide (TRULICITY) 1.5 MG/0.5ML Namon Cirri [id number] Yes Inject 1.5 mg into the skin once a week. Sharlene Dory, DO Taking Active   ELIQUIS 5 MG TABS tablet [id number] Yes TAKE 1 TABLET TWICE A DAY Sharlene Dory, DO Taking Active   EPINEPHrine 0.3 mg/0.3 mL IJ SOAJ injection [id number] Yes Inject 0.3 mg into the muscle as needed for anaphylaxis. Sharlene Dory, DO Taking Active Self  ezetimibe (ZETIA) 10 MG tablet [id number] Yes TAKE 1 TABLET DAILY Sharlene Dory, DO Taking Active   fenofibrate 160 MG tablet [id number] Yes Take 1 tablet (160 mg total) by mouth daily. Sharlene Dory, DO Taking Active   fluticasone (FLONASE) 50 MCG/ACT nasal spray [id number] Yes Place 2 sprays into both nostrils daily. Junie Spencer, FNP Taking Active   levocetirizine (XYZAL) 5 MG tablet [id number] Yes TAKE 1 TABLET EVERY EVENING  Patient taking differently: Take 5 mg by mouth every evening.   Sharlene Dory, DO Taking Active   Lurasidone HCl  120 MG TABS [id number] Yes Take 1 tablet (120 mg total) by mouth at bedtime. Sharlene Dory, DO Taking Active Self  magnesium oxide (MAG-OX) 400 (240 Mg) MG tablet [id number] Yes Take 400 mg by mouth daily. [provider] Taking Active Self  metFORMIN (GLUCOPHAGE-XR) 500 MG 24 hr tablet [id number] Yes Take 1,000 mg by mouth daily with breakfast. [provider] Taking Active   metoprolol succinate (TOPROL-XL) 100 MG 24 hr tablet [id number] Yes Take 1 tablet (100 mg total) by mouth 2 (two) times daily. Take with or immediately following a meal. Camnitz, Will Daphine Deutscher, MD Taking Active   metoprolol tartrate (LOPRESSOR) 25 MG tablet [id number] Yes Take 1 tablet (25 mg total) by mouth every 4 (four) hours as needed (PALPITATIONS). Regan Lemming, MD Taking Active Self  nitroGLYCERIN (NITROSTAT) 0.4 MG SL tablet [id number] Yes Place 1 tablet (0.4 mg total) under the  tongue every 5 (five) minutes as needed for chest pain. Georgeanna Lea, MD Taking Active Self  ondansetron (ZOFRAN-ODT) 4 MG disintegrating tablet [id number] Yes Take 1 tablet (4 mg total) by mouth every 8 (eight) hours as needed for nausea or vomiting. Sharlene Dory, DO Taking Active   [facility] VERIO test strip [id number] Yes USE DAILY TO CHECK BLOOD SUGAR  Patient taking differently: 1 each by Other route as needed for other. Use daily to check blood sugar.   Sharlene Dory, DO Taking Active   pantoprazole (PROTONIX) 40 MG tablet [id number] Yes Take 1 tablet (40 mg total) by mouth daily. Sharlene Dory, DO Taking Active   QUEtiapine (SEROQUEL) 300 MG tablet [id number] Yes Take 1 tablet (300 mg total) by mouth at bedtime. Sharlene Dory, DO Taking Active Self  topiramate (TOPAMAX) 50 MG tablet [id number] Yes TAKE 3 TABLETS TWICE A DAY  Patient taking differently: Take 75 mg by mouth 2 (two) times daily.   Sharlene Dory, DO Taking Active                Assessment/Plan:  Medication Management -  - Approved for LIS/ Extra Help - Most recently copay for Trulicity was $0; lurasidone was $4.50 and Eliquis was $11.20 -  Called [facility] - they filled first Rx for Farxiga with coupon and cost was $0. They verified that cost going forward will be $11.20   Follow Up Plan: June 2024  Henrene Pastor, PharmD Clinical Pharmacist [facility]

## 2023-03-04 ENCOUNTER — Ambulatory Visit (INDEPENDENT_AMBULATORY_CARE_PROVIDER_SITE_OTHER): Payer: Medicare (Managed Care) | Admitting: Behavioral Health

## 2023-03-04 ENCOUNTER — Encounter: Payer: Self-pay | Admitting: Behavioral Health

## 2023-03-04 VITALS — BP 114/75 | HR 60 | Ht 66.0 in | Wt 221.0 lb

## 2023-03-04 DIAGNOSIS — F411 Generalized anxiety disorder: Secondary | ICD-10-CM | POA: Diagnosis not present

## 2023-03-04 DIAGNOSIS — F3130 Bipolar disorder, current episode depressed, mild or moderate severity, unspecified: Secondary | ICD-10-CM | POA: Diagnosis not present

## 2023-03-04 MED ORDER — VILAZODONE HCL 20 MG PO TABS
20.0000 mg | ORAL_TABLET | Freq: Every day | ORAL | 1 refills | Status: DC
Start: 2023-03-04 — End: 2023-03-25

## 2023-03-04 NOTE — Progress Notes (Signed)
[facility] MD/PA/NP Initial Note  03/04/2023 11:44 AM Paula Stout  MRN:  [id number]  Chief Complaint:  Chief Complaint   Depression; Anxiety; Medication Refill; Patient Education; Establish Care     HPI:  "Paula Stout", 67 year old female presents to this office for initial visit and to establish care.  Collateral information should be considered reliable.  Patient says that her husband passed in 2020.  Says that she originally is from [place] but has been living in [place] for approximately 3 years.  Says that she was diagnosed with bipolar disorder in her late 104s.  Currently on, some of her manic episodes were severe which caused her to participate in a legal financial activity.  Says that she is served time in prison on 5 different occasions.  She has not been incarcerated in over 20 years.  Says that she has 6 children and 8 grandchildren.  She is currently living with one of her daughters and son-in-law.  Says that she has been receiving psychiatric coverage by her PCP with Dr. Carmelia Roller in [facility].  Says that she had a previous psychiatrist in [place] but could not remember his name.  Says that her medication has kept her stable over the last 3 to 4 years with no episodes of mania.  Says that she is experiencing more depression recently and is not happy in her current living situation.  She reports her depression today at 8/10, and anxiety at 5/10.  She is sleeping 7 to 8 hours per night with the aid of Seroquel.  Says that she is interested in adding a new medication to help with her depression and anxiety.  Her PHQ-9 today was score of 17.  Her MDQ was positive with 9 of 14 criterion marked yes.  She endorses history of hyperactivity, frequent irritability, feeling more self-confident, trouble concentrating, increased energy, periods of talkativeness, increased interest in sex, risky or foolish behaviors, and spending money irresponsibly and criminally.  She denies any current mania,  no history of psychosis or auditory or visual hallucinations.  No SI or HI.  Patient states that she feels safe and verbally contracts for safety today.  Past psychiatric medication trials: Zoloft Prozac Cymbalta Lithium Depakote Valium Ativan Gabapentin Trazodone Ambien          Visit Diagnosis:    ICD-10-CM   1. Bipolar I disorder, most recent episode depressed (HCC)  F31.30 Vilazodone HCl 20 MG TABS    2. Generalized anxiety disorder  F41.1 Vilazodone HCl 20 MG TABS      Past Psychiatric History: Bipolar 2, anxiety, MDD  Past Medical History:  Past Medical History:  Diagnosis Date   A-fib [facility])    ACS (acute coronary syndrome) (HCC) 07/31/2021   Acute cystitis without hematuria 05/14/2017   Last Assessment & Plan:  Formatting of this note might be different from the original. - suprapubic tenderness with symptoms of urinary frequency and urgency and subjective fevers (no fevers recorded on any visit). Recently passed a renal stone, US shows left hydronephrosis which is improving.  - UA negative8/24 but pt is clinically symptomatic - will repeat UA  - start pt on Microbid for 5 days   Adhesive capsulitis of left shoulder 12/16/2016   Anxiety    Asthma without status asthmaticus 02/06/2021   At risk for falls 02/04/2019   Atrial fibrillation (HCC) 09/01/2014   Last Assessment & Plan:  Formatting of this note might be different from the original. A:  Chronic.  Sinus rhythm at this time.  States she was told this during admission at [facility] in the past. P:  On baby aspirin at home will continue.  Low dose metoprolol started.   Bipolar 1 disorder, depressed, moderate (HCC) 02/18/2020   Capsulitis of left shoulder 10/20/2021   Chronic anticoagulation 02/20/2020   Chronic atrial fibrillation (HCC) 02/18/2020   Chronic headache 10/11/2013   Chronic interstitial cystitis 12/28/2005   Chronic migraine without aura, intractable, without status migrainosus 06/24/2017    Colon cancer screening 04/15/2016   Last Assessment & Plan:  Formatting of this note might be different from the original. Will schedule for colonoscopy   Depression, recurrent (HCC) 02/18/2020   Diabetes mellitus (HCC)    Dyslipidemia 02/20/2020   Dysmetabolic syndrome X 03/20/2008   GAD (generalized anxiety disorder) 02/18/2020   GERD (gastroesophageal reflux disease) 11/05/2021   Heartburn 04/15/2016   Last Assessment & Plan:  Formatting of this note might be different from the original. Patient was counseled regarding lifestyle modification  Advised to take Prilosec 30 mins before breakfast  Will schedule for EGD   History of atrial fibrillation 06/24/2017   History of posttraumatic stress disorder (PTSD) 10/07/2016   HLD (hyperlipidemia) 09/01/2014   Last Assessment & Plan:  Formatting of this note might be different from the original. A: Chronic. P: Continue statin.   Hyperosmolarity due to secondary diabetes mellitus (HCC) 02/06/2021   Hypertriglyceridemia 02/18/2020   Impaired mobility and activities of daily living 11/14/2015   Irritable bowel syndrome (IBS)    Junctional tachycardia 11/05/2021   Kidney stone 02/06/2021   Migraine without aura, not refractory 02/06/2021   Mixed hyperlipidemia 04/06/2021   Last Assessment & Plan:  Formatting of this note might be different from the original. 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 Controlled   Refractory migraine with aura 02/06/2021   Refractory migraine without aura 02/06/2021   Renal colic on left side 04/22/2017   Stage 3 chronic kidney disease (HCC) 02/20/2020   Unspecified adverse effect of other drug,  medicinal and biological substance(995.29) 02/17/2006   Urinary tract obstruction 02/06/2021    Past Surgical History:  Procedure Laterality Date   ABDOMINAL HYSTERECTOMY     Fibroids   ATRIAL FIBRILLATION ABLATION     ATRIAL FIBRILLATION ABLATION N/A 10/27/2022   Procedure: ATRIAL FIBRILLATION ABLATION;  Surgeon: Regan Lemming, MD;  Location: [facility]VASIVE [facility];  Service: Cardiovascular;  Laterality: N/A;   CARDIAC CATHETERIZATION     CARDIOVERSION     CESAREAN SECTION     KNEE SURGERY     LEFT HEART CATH AND CORONARY ANGIOGRAPHY N/A 03/04/2020   Procedure: LEFT HEART CATH AND CORONARY ANGIOGRAPHY;  Surgeon: Lyn Records, MD;  Location: [facility]VAS[facility];  Service: Cardiovascular;  Laterality: N/A;   ROTATOR CUFF REPAIR      Family Psychiatric History: see chart  Family History:  Family History  Adopted: Yes  Problem Relation Age of Onset   Migraines Daughter    Bipolar disorder Daughter    Migraines Daughter    Migraines Son    Bipolar disorder Son     Social History:  Social History   Socioeconomic History   Marital status: Widowed  Spouse name: Not on file   Number of children: 6   Years of education: Not on file   Highest education level: Associate degree: academic program  Occupational History   Occupation: not employed  Tobacco Use   Smoking status: Never   Smokeless tobacco: Never   Tobacco comments:    Never smoke 07/15/22  Vaping Use   Vaping Use: Never used  Substance and Sexual Activity   Alcohol use: Never    Comment: light social   Drug use: Never   Sexual activity: Not Currently  Other Topics Concern   Not on file  Social History Narrative   Lives in [place] with daughter and son-in-law.  Sews clothing in free time as hobby.    Social Determinants of Health   Financial Resource Strain: Medium Risk (02/16/2023)   Overall Financial Resource Strain (CARDIA)    Difficulty of Paying Living Expenses: Somewhat hard  Food  Insecurity: No Food Insecurity (02/16/2023)   Hunger Vital Sign    Worried About Running Out of Food in the Last Year: Never true    Ran Out of Food in the Last Year: Never true  Transportation Needs: No Transportation Needs (02/16/2023)   PRAPARE - Administrator, Civil Service (Medical): No    Lack of Transportation (Non-Medical): No  Physical Activity: Unknown (02/16/2023)   Exercise Vital Sign    Days of Exercise per Week: 2 days    Minutes of Exercise per Session: Patient declined  Stress: Stress Concern Present (02/16/2023)   [employer] of Occupational Health - Occupational Stress Questionnaire    Feeling of Stress : To some extent  Social Connections: Socially Isolated (02/16/2023)   Social Connection and Isolation Panel [NHANES]    Frequency of Communication with Friends and Family: More than three times a week    Frequency of Social Gatherings with Friends and Family: Never    Attends Religious Services: Never    Database administrator or Organizations: No    Attends Engineer, structural: Not on file    Marital Status: Widowed    Allergies:  Allergies  Allergen Reactions   Bee Venom Anaphylaxis   Onion Other (See Comments)    Respiratory Distress   Tetanus-Diphtheria Toxoids Td Anaphylaxis   Sumatriptan     Passed out, nose bleed    Asa [Aspirin] Nausea And Vomiting   Iodine Rash   Nsaids Nausea Only    Rash, hives and trouble breathing   Sulfa Antibiotics Rash   Vancomycin Rash    Other Reaction(s): STEVENS-JOHNSON    Metabolic Disorder Labs: Lab Results  Component Value Date   HGBA1C 6.2 11/22/2022   MPG 186 05/20/2020   MPG 163 02/20/2020   No results found for: "PROLACTIN" Lab Results  Component Value Date   CHOL 136 11/22/2022   TRIG 172.0 (H) 11/22/2022   HDL 34.30 (L) 11/22/2022   CHOLHDL 4 11/22/2022   VLDL 34.4 11/22/2022   LDLCALC 67 11/22/2022   LDLCALC 63 07/21/2022   Lab Results  Component Value Date   TSH  1.272 07/08/2022   TSH 1.490 01/12/2022    Therapeutic Level Labs: No results found for: "LITHIUM" No results found for: "VALPROATE" No results found for: "CBMZ"  Current Medications: Current Outpatient Medications  Medication Sig Dispense Refill   acetaminophen (TYLENOL) 500 MG tablet Take 1,000 mg by mouth every 6 (six) hours as needed for mild pain.     albuterol (VENTOLIN HFA) 108 (90  Base) MCG/ACT inhaler Inhale 2 puffs into the lungs every 4 (four) hours as needed for wheezing. 1 each 2   atorvastatin (LIPITOR) 80 MG tablet TAKE 1 TABLET DAILY 90 tablet 3   blood glucose meter kit and supplies One Touch Ultra  Check 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 One Touch Ultra  Check blood sugars once daily Dx: E11.9) 1 each 0   botulinum toxin Type A (BOTOX) 200 units injection Inject 155 units IM into multiple site in the face,neck and head once every 90 days (Patient taking differently: Inject 200 Units into the muscle every 3 (three) months. Inject 155 units IM into multiple site in the face,neck and head once every 90 days) 1 each 4   buPROPion (WELLBUTRIN XL) 300 MG 24 hr tablet TAKE 1 TABLET DAILY 90 tablet 3   Cholecalciferol (VITAMIN D3) 125 MCG (5000 UT) capsule Take 5,000 Units by mouth daily.     Cyanocobalamin (VITAMIN B-12) 5000 MCG TBDP Take 5,000 mcg by mouth daily.     dapagliflozin propanediol (FARXIGA) 10 MG TABS tablet Take 1 tablet (10 mg total) by mouth daily. 30 tablet 1   diltiazem (CARDIZEM) 30 MG tablet Take 1 tablet every 4 hours AS NEEDED for AFIB or SVT heart rate >95 as long as top BP >100. (Patient taking differently: Take 30 mg by mouth every 4 (four) hours as needed (see below). Take 1 tablet every 4 hours AS NEEDED for AFIB or SVT heart rate >95 as long as top BP >100.) 30 tablet 1   diphenhydrAMINE (BENADRYL) 25 MG tablet Take 25 mg by mouth daily as needed for allergies (seasonal).      Dulaglutide (TRULICITY) 0.75 MG/0.5ML SOPN Inject 0.75 mg into the skin once a week for 28 days. 2 mL 0   Dulaglutide (TRULICITY) 0.75 MG/0.5ML SOPN Inject 0.75 mg into the skin once a week for 28 days. 2 mL 0   [START ON 03/18/2023] Dulaglutide (TRULICITY) 1.5 MG/0.5ML SOPN Inject 1.5 mg into the skin once a week. 6 mL 2   [START ON 03/28/2023] Dulaglutide (TRULICITY) 1.5 MG/0.5ML SOPN Inject 1.5 mg into the skin once a week. 2 mL 1   ELIQUIS 5 MG TABS tablet TAKE 1 TABLET TWICE A DAY 180 tablet 3   EPINEPHrine 0.3 mg/0.3 mL IJ SOAJ injection Inject 0.3 mg into the muscle as needed for anaphylaxis. 1 each 1   ezetimibe (ZETIA) 10 MG tablet TAKE 1 TABLET DAILY 90 tablet 3   fenofibrate 160 MG tablet Take 1 tablet (160 mg total) by mouth daily. 90 tablet 3   fluticasone (FLONASE) 50 MCG/ACT nasal spray Place 2 sprays into both nostrils daily. 16 g 6   levocetirizine (XYZAL) 5 MG tablet TAKE 1 TABLET EVERY EVENING (Patient taking differently: Take 5 mg by mouth every evening.) 90 tablet 3   Lurasidone HCl 120 MG TABS Take 1 tablet (120 mg total) by mouth at bedtime. 90 tablet 3   magnesium oxide (MAG-OX) 400 (240 Mg) MG tablet Take 400 mg by mouth daily.     metFORMIN (GLUCOPHAGE-XR) 500 MG 24 hr tablet Take 1,000 mg by mouth daily with breakfast.     metoprolol succinate (TOPROL-XL) 100 MG 24 hr tablet Take 1 tablet (100 mg total) by mouth 2 (two) times daily. Take with or immediately following a meal. 180 tablet 1   metoprolol tartrate (LOPRESSOR) 25 MG tablet Take 1 tablet (25 mg  total) by mouth every 4 (four) hours as needed (PALPITATIONS). 90 tablet 3   nitroGLYCERIN (NITROSTAT) 0.4 MG SL tablet Place 1 tablet (0.4 mg total) under the tongue every 5 (five) minutes as needed for chest pain. 25 tablet 11   ondansetron (ZOFRAN-ODT) 4 MG disintegrating tablet Take 1 tablet (4 mg total) by mouth every 8 (eight) hours as needed for nausea or vomiting. 20 tablet 0   ONETOUCH VERIO test strip USE DAILY TO  CHECK BLOOD SUGAR (Patient taking differently: 1 each by Other route as needed for other. Use daily to check blood sugar.) 100 strip 3   pantoprazole (PROTONIX) 40 MG tablet Take 1 tablet (40 mg total) by mouth daily. 90 tablet 3   QUEtiapine (SEROQUEL) 300 MG tablet Take 1 tablet (300 mg total) by mouth at bedtime. 90 tablet 3   topiramate (TOPAMAX) 50 MG tablet TAKE 3 TABLETS TWICE A DAY (Patient taking differently: Take 75 mg by mouth 2 (two) times daily.) 180 tablet 11   Vilazodone HCl 20 MG TABS Take 1 tablet (20 mg total) by mouth daily after breakfast. 30 tablet 1   No current facility-administered medications for this visit.    Medication Side Effects: none  Orders placed this visit:  No orders of the defined types were placed in this encounter.   Psychiatric Specialty Exam:  Review of Systems  Constitutional:  Positive for fatigue.  HENT:  Positive for tinnitus.   Respiratory:  Positive for cough.   Cardiovascular:  Positive for chest pain and palpitations.  Gastrointestinal:  Positive for nausea.  Neurological:  Positive for dizziness and headaches.    Blood pressure 114/75, pulse 60, height 5\' 6"  (1.676 m), weight 221 lb (100.2 kg).Body mass index is 35.67 kg/m.  General Appearance: Casual and Neat  Eye Contact:  Good  Speech:  Clear and Coherent  Volume:  Normal  Mood:  Depressed and Dysphoric  Affect:  Congruent, Depressed, and Anxious  Thought Process:  Coherent  Orientation:  Full (Time, Place, and Person)  Thought Content: Logical   Suicidal Thoughts:  No  Homicidal Thoughts:  No  Memory:  WNL  Judgement:  Good  Insight:  Good  Psychomotor Activity:  Normal  Concentration:  Concentration: Good  Recall:  Good  Fund of Knowledge: Good  Language: Good  Assets:  Desire for Improvement  ADL's:  Intact  Cognition: WNL  Prognosis:  Good   Screenings:  PHQ2-9    Flowsheet Row Office Visit from 03/04/2023 in [facility]  Visit from 02/28/2023 in [facility] at [facility] Office Visit from 09[facility]13/2022 in [facility] Office Visit from 07[facility]21/2022 in [facility] Primary Care at [facility] Office Visit from 12/15/2021 in [facility] Care at [facility]  PHQ-2 Total Score 5 3 0 2 1  PHQ-9 Total Score 17 3 0 3 4      Flowsheet Row Admission (Discharged) from 10/27/2022 in [facility] ED from 07/08/2022 in [facility] Department at [facility] ED from 04/11/2022 in [facility] Emergency Department at [facility]  C-SSRS RISK CATEGORY No Risk No Risk No Risk       Receiving Psychotherapy: No   Treatment Plan/Recommendations:   Greater than 50% of 60 min face to face time with patient was spent on counseling and coordination of care. We discussed her long history  of bipolar disorder stemming back to her late 53s.  We discussed her history of extreme manic episodes that led to a legal financial crime.  She previously reports 5 different occasions of being incarcerated over 20 years ago.  We talked about her prior medication trials as well as plan of care.  She more recently has been receiving psychiatric coverage from her PCP since living in [place] the last 3 years.  Patient has history of SVTs.  She is scheduled for additional cardiac ablation on August 28.  She is currently taking higher doses of metoprolol to mediate cardiac condition.  Also has history of A-Fib.  Advised patient to f/u and  clear any new medication with cardiologist.  We agreed today to: Will continue Seroquel 300 mg at bedtime daily Will continue Latuda 120 mg daily To continue Wellbutrin 300 mg daily Will start Viibryd 10 mg for 7 days, then 20 mg daily.  Must take with food. Will report worsening symptoms promptly Provided emergency contact information Reviewed  PDMP       Joan Flores, NP

## 2023-03-08 ENCOUNTER — Encounter: Payer: Self-pay | Admitting: Family Medicine

## 2023-03-09 ENCOUNTER — Other Ambulatory Visit: Payer: Self-pay | Admitting: Family Medicine

## 2023-03-09 MED ORDER — FLUCONAZOLE 150 MG PO TABS
ORAL_TABLET | ORAL | 0 refills | Status: DC
Start: 2023-03-09 — End: 2023-03-14

## 2023-03-09 NOTE — Addendum Note (Signed)
Addended by: Jamare Vanatta P on: 03/09/2023 05:11 PM   Modules accepted: Level of Service  

## 2023-03-14 ENCOUNTER — Ambulatory Visit: Payer: Medicare (Managed Care) | Admitting: Pharmacist

## 2023-03-14 ENCOUNTER — Encounter: Payer: Self-pay | Admitting: Cardiology

## 2023-03-14 DIAGNOSIS — N184 Chronic kidney disease, stage 4 (severe): Secondary | ICD-10-CM

## 2023-03-14 DIAGNOSIS — E1165 Type 2 diabetes mellitus with hyperglycemia: Secondary | ICD-10-CM

## 2023-03-14 DIAGNOSIS — I480 Paroxysmal atrial fibrillation: Secondary | ICD-10-CM

## 2023-03-14 NOTE — Telephone Encounter (Signed)
Followed up with pt. Reports CP last several week, progressively worsening, not relieved by nitroglycerin. Discussed w/ pharmD - not related in Toprol increase. Advised pt she should go to ED for evaluation. Aware forwarding to Dr. Vanetta Shawl office to follow up with pt on this matter. Pt verbalized understanding,

## 2023-03-14 NOTE — Progress Notes (Signed)
03/14/2023 Name: Paula Stout MRN: [id number] DOB: 1955/12/18  Chief Complaint  Patient presents with   Diabetes   Medication Management    Follow up    Paula Stout is a 67 y.o. year old female.   They were referred to the pharmacist by their PCP for assistance in managing medication access and complex medication management.   Subjective: Patient has  been approved for Extra Help from Medicare. Medications are now $0, $4 or $11.20.  We started [place] last month to help with DM and CKD.   Patient reports affordability concerns with their medications: Yes  - improved since she was approved for LIS / Medicare Extra Help Patient reports access/transportation concerns to their pharmacy: No  Patient reports adherence concerns with their medications:  Yes  - has had difficulty getting Trulicity consistently due to supply issues. She endorses today that she has 1 more dose of 0.75mg  Trulicity and then will start 1.5mg  Trulicity - has 84 day supply of 1.5mg  dose.    Type 2 DM:  Current therapy - metformin ER 500mg  - take 2 tablets daily (but patient reports she is taking 2 tablets twice a day). Trulicity 0.75mg  weekly, patient will increase to 1.5mg  weekly after this weeks dose. Farxiga 10mg  daily started 02/25/2023.   Previous medications tried: Ozempic - stopped due to nausea.   Blood glucose checked by cardio with BMET 02/22/2023 was 455. Dr Carmelia Roller checked 03/01/2023 and blood glucose was 178 Home blood glucose recently has been 120's to 170's.  This morning blood glucose was 129.   CKD - Serum creatinine and eGFR was worsened from 06/18 to 06/24.  - eGFR was 22 (02/28/2023). Patient to have BMET rechecked in 1 week and will see nephrologist - Dr Kathrene Bongo.   Hypertension / Afib:  Current therapy: metoprolol succinate 100mg  twice a day  Anticoagulation therapy: Eliquis 5mg  twice a day (Age = 67 yo; Scr = 2.28; weight = 100.5 kg)  BP Readings from Last 3 Encounters:   03/04/23 114/75  02/28/23 128/80  02/22/23 110/70   CHA2DS2-VASc Score = 3  The patient's score is based upon: CHF History: 0 HTN History: 0 Diabetes History: 1 Stroke History: 0 Vascular Disease History: 0 Age Score: 1 Gender Score: 1  Medication management:  Patient asks if either [place] or Viibryd / vilazodone could be cause of recent dry mouth.   Objective:  Lab Results  Component Value Date   HGBA1C 6.2 11/22/2022    Lab Results  Component Value Date   CREATININE 2.28 (H) 02/28/2023   BUN 29 (H) 02/28/2023   NA 138 02/28/2023   K 4.4 02/28/2023   CL 105 02/28/2023   CO2 21 02/28/2023    Lab Results  Component Value Date   CHOL 136 11/22/2022   HDL 34.30 (L) 11/22/2022   LDLCALC 67 11/22/2022   TRIG 172.0 (H) 11/22/2022   CHOLHDL 4 11/22/2022    Medications Reviewed Today     Reviewed by Henrene Pastor, RPH-CPP (Pharmacist) on 03/14/23 at 1013  Med List Status: <None>   Medication Order Taking? Sig Documenting Provider Last Dose Status Informant  acetaminophen (TYLENOL) 500 MG tablet [id number] Yes Take 1,000 mg by mouth every 6 (six) hours as needed for mild pain. [provider] Taking Active Self  albuterol (VENTOLIN HFA) 108 (90 Base) MCG/ACT inhaler [id number] Yes Inhale 2 puffs into the lungs every 4 (four) hours as needed for wheezing. Sharlene Dory, DO Taking Active Self  atorvastatin (LIPITOR) 80  MG tablet [id number] Yes TAKE 1 TABLET DAILY Sharlene Dory, DO Taking Active   blood glucose meter kit and supplies [id number] Yes One Touch Ultra  Check blood sugars once daily Dx: E11.9  Patient taking differently: 1 each by Other route See admin instructions. One Touch Ultra  Check blood sugars once daily Dx: E11.9   Sharlene Dory, DO Taking Active Self  botulinum toxin Type A (BOTOX) 200 units injection [id number] Yes Inject 155 units IM into multiple site in the face,neck and head once every 90 days  Patient taking  differently: Inject 200 Units into the muscle every 3 (three) months. Inject 155 units IM into multiple site in the face,neck and head once every 90 days   Drema Dallas, DO Taking Active Self  buPROPion (WELLBUTRIN XL) 300 MG 24 hr tablet [id number] Yes TAKE 1 TABLET DAILY Sharlene Dory, DO Taking Active   Cholecalciferol (VITAMIN D3) 125 MCG (5000 UT) capsule [id number] Yes Take 5,000 Units by mouth daily. [provider] Taking Active Self  Cyanocobalamin (VITAMIN B-12) 5000 MCG TBDP [id number] Yes Take 5,000 mcg by mouth daily. [provider] Taking Active Self  dapagliflozin propanediol (FARXIGA) 10 MG TABS tablet [id number] Yes Take 1 tablet (10 mg total) by mouth daily. Sharlene Dory, DO Taking Active   diltiazem (CARDIZEM) 30 MG tablet [id number] Yes Take 1 tablet every 4 hours AS NEEDED for AFIB or SVT heart rate >95 as long as top BP >100.  Patient taking differently: Take 30 mg by mouth every 4 (four) hours as needed (see below). Take 1 tablet every 4 hours AS NEEDED for AFIB or SVT heart rate >95 as long as top BP >100.   Sherie Don, NP Taking Active   diphenhydrAMINE (BENADRYL) 25 MG tablet [id number] Yes Take 25 mg by mouth daily as needed for allergies (seasonal). [provider] Taking Active Self           Med Note Nedra Hai   Fri Dec 03, 2022 10:47 AM)    Dulaglutide (TRULICITY) 0.75 MG/0.5ML SOPN [id number] Yes Inject 0.75 mg into the skin once a week for 28 days. Sharlene Dory, DO Taking Active   Dulaglutide (TRULICITY) 1.5 MG/0.5ML SOPN [id number] No Inject 1.5 mg into the skin once a week.  Patient not taking: Reported on 03/14/2023   Sharlene Dory, DO Not Taking Active   ELIQUIS 5 MG TABS tablet [id number] Yes TAKE 1 TABLET TWICE A DAY Sharlene Dory, DO Taking Active   EPINEPHrine 0.3 mg/0.3 mL IJ SOAJ injection [id number]  Inject 0.3 mg into the muscle as needed for anaphylaxis. Sharlene Dory, DO  Active Self  ezetimibe (ZETIA) 10 MG tablet [id number] Yes TAKE 1 TABLET DAILY Sharlene Dory, DO Taking Active   fenofibrate 160 MG tablet [id number] Yes Take 1 tablet (160 mg total) by mouth daily. Sharlene Dory, DO Taking Active   fluticasone (FLONASE) 50 MCG/ACT nasal spray [id number] Yes Place 2 sprays into both nostrils daily. Junie Spencer, FNP Taking Active   levocetirizine (XYZAL) 5 MG tablet [id number] Yes TAKE 1 TABLET EVERY EVENING Sharlene Dory, DO Taking Active   Lurasidone HCl 120 MG TABS [id number] Yes Take 1 tablet (120 mg total) by mouth at bedtime. Sharlene Dory, DO Taking Active Self  magnesium oxide (MAG-OX) 400 (240 Mg) MG tablet [id number] Yes Take 400 mg by mouth daily. [provider] Taking Active Self  metFORMIN (GLUCOPHAGE-XR) 500 MG 24  hr tablet [id number] Yes Take 1,000 mg by mouth 2 (two) times daily with a meal. [provider] Taking Active   metoprolol succinate (TOPROL-XL) 100 MG 24 hr tablet [id number] Yes Take 1 tablet (100 mg total) by mouth 2 (two) times daily. Take with or immediately following a meal. Camnitz, Will Daphine Deutscher, MD Taking Active   metoprolol tartrate (LOPRESSOR) 25 MG tablet [id number] Yes Take 1 tablet (25 mg total) by mouth every 4 (four) hours as needed (PALPITATIONS). Regan Lemming, MD Taking Active Self  nitroGLYCERIN (NITROSTAT) 0.4 MG SL tablet [id number] Yes Place 1 tablet (0.4 mg total) under the tongue every 5 (five) minutes as needed for chest pain. Georgeanna Lea, MD Taking Active Self  ondansetron (ZOFRAN-ODT) 4 MG disintegrating tablet [id number] No Take 1 tablet (4 mg total) by mouth every 8 (eight) hours as needed for nausea or vomiting.  Patient not taking: Reported on 03/14/2023   Sharlene Dory, DO Not Taking Active   [facility] VERIO test strip [id number] Yes USE DAILY TO CHECK BLOOD SUGAR Sharlene Dory, DO Taking Active   pantoprazole (PROTONIX)  40 MG tablet [id number] Yes Take 1 tablet (40 mg total) by mouth daily. Sharlene Dory, DO Taking Active   QUEtiapine (SEROQUEL) 300 MG tablet [id number] Yes Take 1 tablet (300 mg total) by mouth at bedtime. Sharlene Dory, DO Taking Active Self  topiramate (TOPAMAX) 50 MG tablet [id number] Yes TAKE 3 TABLETS TWICE A DAY  Patient taking differently: Take 75 mg by mouth 2 (two) times daily.   Sharlene Dory, DO Taking Active   Vilazodone HCl 20 MG TABS [id number] Yes Take 1 tablet (20 mg total) by mouth daily after breakfast. Joan Flores, NP Taking Active               Assessment/Plan:  Type 2 DM with CKD: home blood glucose readings improved with recent med changes; increase in Scr could be bump related to [place] start.  - Recommended lowering dose of metformin er 500mg  to dose prescribed  - take 2 tablets ONCE a day.  - Continue Farxiga 10mg  daily - due to recheck renal function next week and see nephrologist. Continue to monitor for Mycotic infections and increase in frequency of genital infections.   - Continue as planned to increase Trulicity to 1.5mg  weekly next week.    Metformin dosing from [employer]:  Suggested dosage regimen in patients with stable chronic renal failure to maintain a mean plasma metformin concentration of less than 10 mg/L. The suggested doses below could be reduced by 50% for a more conservative approach to maintain a mean plasma metformin concentration of less than 5 mg/L [59]:  Estimated GFR MAX daily dose  40 mL/min or greater No dose adjustment necessary  30 to 39 mL/min 2000 mg  20 to 29 mL/min 1500 mg  10 to 19 mL/min 500 mg  Less than 10 mL/min Do not use  **Reduce dose or discontinue use if plasma lactate is greater than 3.5 mmol/L    Hypertension / Afib:  Continue metoprolol 100mg  twice a day Continue Eliquis 5mg  twice a day  Medication Management -  Approved for LIS/ Extra Help - Most recently copay for Trulicity  was $0; lurasidone was $4.50 and Eliquis was $11.20 Discussed increase in dry mouth - given that this has occurred in the last 2 weeks, suspect that is more likely related to vilazodone (incidence of dry mouth if 7 to 8 %) and there is no  report of dry mouth with Marcelline Deist though I suspect it would occur.  Recommended patient sip water during daytime. If dry mouth continues and is bothersome after 1 month of vilazodone suggested she contact psychiatrist to discuss other options.   Follow Up Plan: August 2024.  Henrene Pastor, PharmD Clinical Pharmacist [facility]

## 2023-03-14 NOTE — Telephone Encounter (Signed)
Called pt in regards to my chart message.  Reports has had symptoms sent through my chart for the last 3-4 days but are getting worse.  Has taken NTG twice but reports bad HA so doesn't like to take them. Reports chest pain is to the left of center of chest.  Denies arm, jaw and shoulder pain.  Does have nausea doing episodes.  Does not have any recent BP readings.  Reports nothing makes pain better pain goes away on it's own.  Reports had this pain prior to previous ablation.  Advised pt to go to ED.  Pt is hesitant advised will send message to primary nurse to see if has any other suggestions.  In the mean time if symptoms persist or get worse to call 911 and or go to ED for evaluation.

## 2023-03-14 NOTE — Patient Instructions (Signed)
Only take metformin ER 500mg  - 2 tablets once a day with food.   home blood glucose goals  Fasting blood glucose goal (before meals) = 80 to 130 Blood glucose goal after a meal = less than 180  .

## 2023-03-16 ENCOUNTER — Other Ambulatory Visit: Payer: Self-pay | Admitting: Family Medicine

## 2023-03-16 ENCOUNTER — Telehealth: Payer: Self-pay

## 2023-03-16 MED ORDER — NITROGLYCERIN 0.4 MG SL SUBL: 0.4000 mg | SUBLINGUAL_TABLET | SUBLINGUAL | 11 refills | Status: AC | PRN

## 2023-03-16 MED ORDER — METFORMIN HCL ER 500 MG PO TB24: 1000.0000 mg | ORAL_TABLET | Freq: Two times a day (BID) | ORAL | 0 refills | Status: DC

## 2023-03-16 NOTE — Telephone Encounter (Signed)
Spoke with pt regarding note from [employer]. She stated that she continues to have chest pain off and on. She has not gone to the ER. Advised pt to go to the ED if her symptoms worsen or persists. Refilled Nitroglycerin per pt request.

## 2023-03-23 ENCOUNTER — Other Ambulatory Visit: Payer: Self-pay | Admitting: Family Medicine

## 2023-03-23 DIAGNOSIS — N183 Chronic kidney disease, stage 3 unspecified: Secondary | ICD-10-CM | POA: Diagnosis not present

## 2023-03-23 MED ORDER — DAPAGLIFLOZIN PROPANEDIOL 10 MG PO TABS: 10.0000 mg | ORAL_TABLET | Freq: Every day | ORAL | 1 refills | Status: DC

## 2023-03-24 ENCOUNTER — Ambulatory Visit (INDEPENDENT_AMBULATORY_CARE_PROVIDER_SITE_OTHER): Payer: Medicare (Managed Care) | Admitting: Behavioral Health

## 2023-03-24 ENCOUNTER — Encounter: Payer: Self-pay | Admitting: Behavioral Health

## 2023-03-24 DIAGNOSIS — F331 Major depressive disorder, recurrent, moderate: Secondary | ICD-10-CM | POA: Diagnosis not present

## 2023-03-24 DIAGNOSIS — F411 Generalized anxiety disorder: Secondary | ICD-10-CM

## 2023-03-24 NOTE — Progress Notes (Signed)
[facility] Counselor/Therapist Progress Note  Patient ID: Paula Stout, MRN: [id number],    Date: 03/24/2023  Time Spent: 4 PM until 4:58 PM.  58 minutes spent with the patient.This session was held via video teletherapy. The patient consented to the video teletherapy and was located in her home during this session. She is aware it is the responsibility of the patient to secure confidentiality on her end of the session. The provider was in a private home office for the duration of this session.      Treatment Type: Individual Therapy  Reported Symptoms: Anxiety, depression  Mental Status Exam: Appearance:  Casual     Behavior: Appropriate  Motor: Normal  Speech/Language:  Normal Rate  Affect: Appropriate  Mood: normal  Thought process: normal  Thought content:   WNL  Sensory/Perceptual disturbances:   WNL  Orientation: oriented to person, place, time/date, situation, day of week, month of year, and year  Attention: Good  Concentration: Good  Memory: WNL  Fund of knowledge:  Good  Insight:   Good  Judgment:  Good  Impulse Control: Good   Risk Assessment: Danger to Self:  No Self-injurious Behavior: No Danger to Others: No Duty to Warn:no Physical Aggression / Violence:No  Access to Firearms a concern: No  Gang Involvement:No   Subjective: I reviewed the plan of care with the patient and she agreed.  A formal treatment plan follows in this note.  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  It has been rescheduled until the third week of August.  The patient is struggling more physically.  She is having at least a couple of episodes a day which last anywhere from 5 to 10 minutes on average where her chest feels extremely heavy, she gets dizzy and she has difficulty breathing.  Toward the end of our session today she had a very brief 1 in which she had to lay down and place her hands on  her chest having difficulty breathing.  It does help her she drinks some water at the onset of the episode.  She has made her doctor aware that if he has availability other than the third week of August to let her know but she has to be off of certain medications for a week before having the ablation.  Anxiety has been escalated by frustrations with her environment.  Typically she gets up lets her dog out feeds her daughter's cats and start some breakfast.  Neither her daughter nor her son-in-law are doing much around the house and she describes it as almost hoarding his situation.  She made an agreement with her daughter that if she would cook that her daughter would clean up because her daughter is doing food delivery at least half of the meals every week which is not healthy and is very expensive.  The patient knows that she needs to eat healthier and is contemplating some of the prepared fresh meals type of services to look at cost and see if she can eat healthier.  Her daughter cleaned up after dinner 1 night and has not done that since as her condition is sitting in the sink for days.  The patient knows that she cannot take care of her daughter and son-in-law so we talked about what self-care looks like for her.  She spends most of her day in her room and says right now with having increased episodes she  sleeps a lot or reads a lot throughout the day.  Fortunately she is sleeping fairly well at night.  We looked at being mindful of frustration with her daughter knowing that any approach that she has taken in terms of speaking to her calmly rationally or even in a frustrating way has not evoked any change so how the patient can let go of that.  She is already practicing relaxation as a open medical concerns feelings reduced vagus nerve stimulation and we began to talk about some mindfulness and distress tolerance skills which we will look at closure in the next session.  Interventions: Cognitive Behavioral  Therapy  Diagnosis: Generalized anxiety disorder, major depressive disorder, recurrent, moderate.  Plan: I will meet with the patient weekly via care agility.  Treatment plan: We will use cognitive behavioral therapy as well as person centered and supportive therapy in addition to elements of dialectical behavior therapy to help reduce the patient's anxiety and depression by at least 50% with a target date of October 06, 2023.  Goals for improving depression may include having less sadness as indicated by patient report and scores on the PHQ-9, have improved mood and return to a healthier level of functioning, identify causes including environment for depressed mood and learn ways to cope with depression especially those connected to her medical issues.  Interventions include using cognitive behavioral therapy to explore and replace thoughts and behaviors.  We will look at how depression is experienced in day-to-day living and encouraged sharing of feelings.  We will encourage the use of coping skills for management of depressive symptoms.  Goals for reducing anxiety are to improve her ability to manage anxiety symptoms, better handle stress, identify causes for anxiety and explore ways to lower it, resolve the core conflicts contributing to anxiety as well as manage thoughts and worrisome thinking contributing to feelings of anxiety.  Interventions will include providing education about anxiety, facilitate problem solution skills to help her identify options for resolving stress, teach coping skills for managing anxiety as well as mindfulness and communication in terms of family to reduce her stress level, use cognitive behavior therapy to identify and change anxiety provoking thought and behavior patterns as well as teach distress tolerance and mindfulness skills for alleviating anxiety.  [name], [facility]

## 2023-03-24 NOTE — Progress Notes (Signed)
                Craig M Peters, LCMHC 

## 2023-03-25 ENCOUNTER — Other Ambulatory Visit: Payer: Self-pay

## 2023-03-25 DIAGNOSIS — F411 Generalized anxiety disorder: Secondary | ICD-10-CM

## 2023-03-25 DIAGNOSIS — F3130 Bipolar disorder, current episode depressed, mild or moderate severity, unspecified: Secondary | ICD-10-CM

## 2023-03-25 MED ORDER — VILAZODONE HCL 20 MG PO TABS
20.0000 mg | ORAL_TABLET | Freq: Every day | ORAL | 0 refills | Status: DC
Start: 2023-03-25 — End: 2023-04-20

## 2023-03-28 ENCOUNTER — Encounter: Payer: Self-pay | Admitting: Cardiology

## 2023-03-30 ENCOUNTER — Ambulatory Visit (INDEPENDENT_AMBULATORY_CARE_PROVIDER_SITE_OTHER): Payer: Medicare (Managed Care) | Admitting: Behavioral Health

## 2023-03-30 ENCOUNTER — Encounter: Payer: Self-pay | Admitting: Behavioral Health

## 2023-03-30 ENCOUNTER — Encounter: Payer: Self-pay | Admitting: Neurology

## 2023-03-30 DIAGNOSIS — H04123 Dry eye syndrome of bilateral lacrimal glands: Secondary | ICD-10-CM | POA: Diagnosis not present

## 2023-03-30 DIAGNOSIS — F411 Generalized anxiety disorder: Secondary | ICD-10-CM

## 2023-03-30 DIAGNOSIS — F331 Major depressive disorder, recurrent, moderate: Secondary | ICD-10-CM

## 2023-03-30 DIAGNOSIS — E119 Type 2 diabetes mellitus without complications: Secondary | ICD-10-CM | POA: Diagnosis not present

## 2023-03-30 DIAGNOSIS — H43393 Other vitreous opacities, bilateral: Secondary | ICD-10-CM | POA: Diagnosis not present

## 2023-03-30 DIAGNOSIS — H524 Presbyopia: Secondary | ICD-10-CM | POA: Diagnosis not present

## 2023-03-30 DIAGNOSIS — H5213 Myopia, bilateral: Secondary | ICD-10-CM | POA: Diagnosis not present

## 2023-03-30 DIAGNOSIS — H2513 Age-related nuclear cataract, bilateral: Secondary | ICD-10-CM | POA: Diagnosis not present

## 2023-03-30 LAB — HM DIABETES EYE EXAM

## 2023-03-30 NOTE — Progress Notes (Signed)
[facility] Counselor/Therapist Progress Note  Patient ID: Paula Stout, MRN: [id number],    Date: 03/30/2023  Time Spent: 50 minutes, 1105 to 11:55 AM spent with the patient.This session was held via video teletherapy. The patient consented to the video teletherapy and was located in her home during this session. She is aware it is the responsibility of the patient to secure confidentiality on her end of the session. The provider was in a private home  for the duration of this session.      Treatment Type: Individual Therapy  Reported Symptoms: Anxiety, depression  Mental Status Exam: Appearance:  Casual     Behavior: Appropriate  Motor: Normal  Speech/Language:  Normal Rate  Affect: Appropriate  Mood: normal  Thought process: normal  Thought content:   WNL  Sensory/Perceptual disturbances:   WNL  Orientation: oriented to person, place, time/date, situation, day of week, month of year, and year  Attention: Good  Concentration: Good  Memory: WNL  Fund of knowledge:  Good  Insight:   Good  Judgment:  Good  Impulse Control: Good   Risk Assessment: Danger to Self:  No Self-injurious Behavior: No Danger to Others: No Duty to Warn:no Physical Aggression / Violence:No  Access to Firearms a concern: No  Gang Involvement:No   Subjective: The patient did send a my chart message to her provider to see if there was any way she could be put on a wait list for the ablation and was told that if they had a cancellation they would let her know.  That would still need to be a week because she would need to be off of certain medications before the procedure.  She continues to have a couple of episodes per day and cannot be up very long.  This morning for example she was out for about 30 minutes loading and unloading the dishwasher and washing a few dishes and was exhausted.  She says her daughter does not understand.  Her daughter asked her why she is not feeding the cats  anymore and makes not comments about the patient not getting the dishes done when she is the patient has explain her condition to her daughter already.  Her daughter will take her to medical appointments which she appreciates.  The patient is learning to set clear boundaries saying she is explained to the daughter what is going on so she can fears that she is still doing tomorrow.  We talked about the importance of mental emotional verbal and physical boundaries for the sake of the patient's overall health.  Also introduced the concept of intentional mindfulness and we looked at a couple of ways within illustration that she can practice that as a way to better be aware of her thoughts and her feelings but as a way of reducing stress.  She says that her health and relationship with daughter's lateral wall raises her anxiety level and that in turn has created more migraines.  The patient does contract for safety having no thoughts of hurting herself or anyone else.  Interventions: Cognitive Behavioral Therapy  Diagnosis: Generalized anxiety disorder, major depressive disorder, recurrent, moderate.  Plan: I will meet with the patient weekly via care agility.  Treatment plan: We will use cognitive behavioral therapy as well as person centered and supportive therapy in addition to elements of dialectical behavior therapy to help reduce the patient's anxiety and depression by at least 50% with a target date of October 06, 2023.  Goals for improving depression  may include having less sadness as indicated by patient report and scores on the PHQ-9, have improved mood and return to a healthier level of functioning, identify causes including environment for depressed mood and learn ways to cope with depression especially those connected to her medical issues.  Interventions include using cognitive behavioral therapy to explore and replace thoughts and behaviors.  We will look at how depression is experienced in  day-to-day living and encouraged sharing of feelings.  We will encourage the use of coping skills for management of depressive symptoms.  Goals for reducing anxiety are to improve her ability to manage anxiety symptoms, better handle stress, identify causes for anxiety and explore ways to lower it, resolve the core conflicts contributing to anxiety as well as manage thoughts and worrisome thinking contributing to feelings of anxiety.  Interventions will include providing education about anxiety, facilitate problem solution skills to help her identify options for resolving stress, teach coping skills for managing anxiety as well as mindfulness and communication in terms of family to reduce her stress level, use cognitive behavior therapy to identify and change anxiety provoking thought and behavior patterns as well as teach distress tolerance and mindfulness skills for alleviating anxiety.  [name], [facility]                  [name], [facility]

## 2023-03-31 ENCOUNTER — Other Ambulatory Visit: Payer: Self-pay | Admitting: Neurology

## 2023-03-31 ENCOUNTER — Encounter (INDEPENDENT_AMBULATORY_CARE_PROVIDER_SITE_OTHER): Payer: Medicare (Managed Care) | Admitting: Family Medicine

## 2023-03-31 DIAGNOSIS — B3731 Acute candidiasis of vulva and vagina: Secondary | ICD-10-CM | POA: Diagnosis not present

## 2023-03-31 MED ORDER — FLUCONAZOLE 150 MG PO TABS: ORAL_TABLET | ORAL | 0 refills | Status: DC

## 2023-03-31 NOTE — Telephone Encounter (Signed)

## 2023-04-01 ENCOUNTER — Ambulatory Visit: Payer: Medicare (Managed Care) | Admitting: Behavioral Health

## 2023-04-01 ENCOUNTER — Other Ambulatory Visit: Payer: Self-pay | Admitting: Neurology

## 2023-04-01 ENCOUNTER — Other Ambulatory Visit (HOSPITAL_COMMUNITY): Payer: Self-pay

## 2023-04-01 ENCOUNTER — Other Ambulatory Visit: Payer: Self-pay

## 2023-04-01 ENCOUNTER — Telehealth: Payer: Self-pay | Admitting: Pharmacy Technician

## 2023-04-01 DIAGNOSIS — G43709 Chronic migraine without aura, not intractable, without status migrainosus: Secondary | ICD-10-CM

## 2023-04-01 MED ORDER — NURTEC 75 MG PO TBDP
75.0000 mg | ORAL_TABLET | ORAL | 5 refills | Status: DC | PRN
Start: 2023-04-01 — End: 2023-04-04

## 2023-04-01 NOTE — Telephone Encounter (Signed)
Pharmacy Patient Advocate Encounter  Received notification from [employer] that Prior Authorization for NURTEC 75MG  has been APPROVED from 6.26.24 to 7.26.25. Ran test claim, Copay is $ .  PA #/Case ID/Reference #: [id number]

## 2023-04-01 NOTE — Telephone Encounter (Signed)
Pharmacy Patient Advocate Encounter   Received notification from CoverMyMeds that prior authorization for NURTEC 75MG  is required/requested.   Insurance verification completed.   The patient is insured through [employer] .   Per test claim: PA required; PA submitted to [employer] via CoverMyMeds Key/confirmation #/EOC [id number] Status is pending

## 2023-04-05 DIAGNOSIS — R809 Proteinuria, unspecified: Secondary | ICD-10-CM | POA: Diagnosis not present

## 2023-04-05 DIAGNOSIS — N183 Chronic kidney disease, stage 3 unspecified: Secondary | ICD-10-CM | POA: Diagnosis not present

## 2023-04-05 DIAGNOSIS — I129 Hypertensive chronic kidney disease with stage 1 through stage 4 chronic kidney disease, or unspecified chronic kidney disease: Secondary | ICD-10-CM | POA: Diagnosis not present

## 2023-04-06 ENCOUNTER — Ambulatory Visit: Payer: Medicare (Managed Care) | Admitting: Behavioral Health

## 2023-04-06 ENCOUNTER — Encounter: Payer: Self-pay | Admitting: Behavioral Health

## 2023-04-06 DIAGNOSIS — F411 Generalized anxiety disorder: Secondary | ICD-10-CM | POA: Diagnosis not present

## 2023-04-06 DIAGNOSIS — F3130 Bipolar disorder, current episode depressed, mild or moderate severity, unspecified: Secondary | ICD-10-CM

## 2023-04-06 MED ORDER — VILAZODONE HCL 40 MG PO TABS
40.0000 mg | ORAL_TABLET | Freq: Every day | ORAL | 3 refills | Status: DC
Start: 2023-04-06 — End: 2023-06-29

## 2023-04-06 NOTE — Progress Notes (Signed)
[facility]  Patient ID: Paula Stout,  MRN: [id number]  PCP: Sharlene Dory, DO  Date of Evaluation: 04/06/2023 Time spent:30 minutes  Chief Complaint:  Chief Complaint   Anxiety; Depression; Follow-up; Patient Education; Medication Refill     HISTORY/CURRENT STATUS: HPI "Paula Stout", 67 year old female presents to this office for follow up and medication management.  Collateral information should be considered reliable.  Says that she needs a cardiac ablation for SVT's but procedure was postponed due to lack of anesthetist personnel. She is schedule for Aug 28th. Right now says she is uncomfortable with the fluttering which increases her anxiety.    Says that she is experiencing more depression recently and is not happy in her current living situation.  She reports her depression today at 6/10, and anxiety at 5/10.  She is sleeping 7 to 8 hours per night with the aid of Seroquel.  She would like to consider a medication adjustment this visit.    She denies any current mania, no history of psychosis or auditory or visual hallucinations.  No SI or HI.  Patient states that she feels safe and verbally contracts for safety today.   Past psychiatric medication trials: Zoloft Prozac Cymbalta Lithium Depakote Valium Ativan Gabapentin Trazodone Ambien           Individual Medical History/ Review of Systems: Changes? :No   Allergies: Bee venom, Onion, Tetanus-diphtheria toxoids td, Sumatriptan, Asa [aspirin], Iodine, Nsaids, Sulfa antibiotics, and Vancomycin  Current Medications:  Current Outpatient Medications:    acetaminophen (TYLENOL) 500 MG tablet, Take 1,000 mg by mouth every 6 (six) hours as needed for mild pain., Disp: , Rfl:    albuterol (VENTOLIN HFA) 108 (90 Base) MCG/ACT inhaler, Inhale 2 puffs into the lungs every 4 (four) hours as needed for wheezing., Disp: 1 each, Rfl: 2   atorvastatin (LIPITOR) 80 MG tablet, TAKE 1 TABLET DAILY, Disp: 90  tablet, Rfl: 3   blood glucose meter kit and supplies, One Touch Ultra  Check 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 One Touch Ultra  Check blood sugars once daily Dx: E11.9), Disp: 1 each, Rfl: 0   botulinum toxin Type A (BOTOX) 200 units injection, Inject 155 units IM into multiple site in the face,neck and head once every 90 days (Patient taking differently: Inject 200 Units into the muscle every 3 (three) months. Inject 155 units IM into multiple site in the face,neck and head once every 90 days), Disp: 1 each, Rfl: 4   buPROPion (WELLBUTRIN XL) 300 MG 24 hr tablet, TAKE 1 TABLET DAILY, Disp: 90 tablet, Rfl: 3   Cholecalciferol (VITAMIN D3) 125 MCG (5000 UT) capsule, Take 5,000 Units by mouth daily., Disp: , Rfl:    Cyanocobalamin (VITAMIN B-12) 5000 MCG TBDP, Take 5,000 mcg by mouth daily., Disp: , Rfl:    dapagliflozin propanediol (FARXIGA) 10 MG TABS tablet, Take 1 tablet (10 mg total) by mouth daily., Disp: 90 tablet, Rfl: 1   diltiazem (CARDIZEM) 30 MG tablet, Take 1 tablet every 4 hours AS NEEDED for AFIB or SVT heart rate >95 as long as top BP >100. (Patient taking differently: Take 30 mg by mouth every 4 (four) hours as needed (see below). Take 1 tablet every 4 hours AS NEEDED for AFIB or SVT heart rate >95 as long as top BP >100.), Disp: 30 tablet, Rfl: 1   diphenhydrAMINE (BENADRYL) 25 MG tablet, Take 25 mg by mouth daily as  needed for allergies (seasonal)., Disp: , Rfl:    Dulaglutide (TRULICITY) 1.5 MG/0.5ML SOPN, Inject 1.5 mg into the skin once a week. (Patient not taking: Reported on 03/14/2023), Disp: 6 mL, Rfl: 2   ELIQUIS 5 MG TABS tablet, TAKE 1 TABLET TWICE A DAY, Disp: 180 tablet, Rfl: 3   EPINEPHrine 0.3 mg/0.3 mL IJ SOAJ injection, Inject 0.3 mg into the muscle as needed for anaphylaxis., Disp: 1 each, Rfl: 1   ezetimibe (ZETIA) 10 MG tablet, TAKE 1 TABLET DAILY, Disp: 90 tablet, Rfl: 3   fenofibrate 160 MG  tablet, Take 1 tablet (160 mg total) by mouth daily., Disp: 90 tablet, Rfl: 3   fluconazole (DIFLUCAN) 150 MG tablet, Take 1 tab, repeat in 72 hours if no improvement., Disp: 2 tablet, Rfl: 0   fluticasone (FLONASE) 50 MCG/ACT nasal spray, Place 2 sprays into both nostrils daily., Disp: 16 g, Rfl: 6   levocetirizine (XYZAL) 5 MG tablet, TAKE 1 TABLET EVERY EVENING, Disp: 90 tablet, Rfl: 3   Lurasidone HCl 120 MG TABS, Take 1 tablet (120 mg total) by mouth at bedtime., Disp: 90 tablet, Rfl: 3   magnesium oxide (MAG-OX) 400 (240 Mg) MG tablet, Take 400 mg by mouth daily., Disp: , Rfl:    metFORMIN (GLUCOPHAGE-XR) 500 MG 24 hr tablet, Take 2 tablets (1,000 mg total) by mouth 2 (two) times daily with a meal., Disp: 120 tablet, Rfl: 0   metoprolol succinate (TOPROL-XL) 100 MG 24 hr tablet, Take 1 tablet (100 mg total) by mouth 2 (two) times daily. Take with or immediately following a meal., Disp: 180 tablet, Rfl: 1   metoprolol tartrate (LOPRESSOR) 25 MG tablet, Take 1 tablet (25 mg total) by mouth every 4 (four) hours as needed (PALPITATIONS)., Disp: 90 tablet, Rfl: 3   nitroGLYCERIN (NITROSTAT) 0.4 MG SL tablet, Place 1 tablet (0.4 mg total) under the tongue every 5 (five) minutes as needed for chest pain., Disp: 25 tablet, Rfl: 11   ondansetron (ZOFRAN-ODT) 4 MG disintegrating tablet, Take 1 tablet (4 mg total) by mouth every 8 (eight) hours as needed for nausea or vomiting. (Patient not taking: Reported on 03/14/2023), Disp: 20 tablet, Rfl: 0   ONETOUCH VERIO test strip, USE DAILY TO CHECK BLOOD SUGAR, Disp: 100 strip, Rfl: 3   pantoprazole (PROTONIX) 40 MG tablet, Take 1 tablet (40 mg total) by mouth daily., Disp: 90 tablet, Rfl: 3   QUEtiapine (SEROQUEL) 300 MG tablet, Take 1 tablet (300 mg total) by mouth at bedtime., Disp: 90 tablet, Rfl: 3   Rimegepant Sulfate (NURTEC) 75 MG TBDP, Take 1 tablet (75 mg total) by mouth as needed (take 1 tab at the earlist onset of a migraine. Max 1 tab in 24 hours).,  Disp: 10 tablet, Rfl: 5   topiramate (TOPAMAX) 50 MG tablet, TAKE 3 TABLETS TWICE A DAY (Patient taking differently: Take 75 mg by mouth 2 (two) times daily.), Disp: 180 tablet, Rfl: 11   Vilazodone HCl 20 MG TABS, Take 1 tablet (20 mg total) by mouth daily after breakfast., Disp: 90 tablet, Rfl: 0 Medication Side Effects: none  Family Medical/ Social History: Changes? No  MENTAL HEALTH EXAM:  There were no vitals taken for this visit.There is no height or weight on file to calculate BMI.  General Appearance: Casual and Neat  Eye Contact:  Good  Speech:  Clear and Coherent  Volume:  Normal  Mood:  Anxious and Depressed  Affect:  Depressed and Anxious  Thought Process:  Coherent  Orientation:  Full (Time, Place, and Person)  Thought Content: Logical   Suicidal Thoughts:  No  Homicidal Thoughts:  No  Memory:  WNL  Judgement:  Good  Insight:  Good  Psychomotor Activity:  Normal  Concentration:  Concentration: Good  Recall:  Good  Fund of Knowledge: Good  Language: Good  Assets:  Desire for Improvement  ADL's:  Intact  Cognition: WNL  Prognosis:  Good    DIAGNOSES:    ICD-10-CM   1. Generalized anxiety disorder  F41.1     2. Bipolar I disorder, most recent episode depressed (HCC)  F31.30       Receiving Psychotherapy: No    RECOMMENDATIONS:   Greater than 50% of 30 min face to face time with patient was spent on counseling and coordination of care. We discussed her recent health concerns as well as her medications. Says that she does not notice a difference right now with Viibryd.  Patient has history of SVTs.  She is scheduled for additional cardiac ablation on August 28.  She is currently taking higher doses of metoprolol to mediate cardiac condition.  Also has history of A-Fib.  Advised patient to f/u and  clear any new medication with cardiologist.   We agreed today to: Will continue Seroquel 300 mg at bedtime daily Will continue Latuda 120 mg daily To continue  Wellbutrin 300 mg daily To increase Viibryd to 40 mg daily. Must take with food. Will report worsening symptoms promptly Provided emergency contact information Reviewed PDMP              Joan Flores, NP

## 2023-04-12 ENCOUNTER — Other Ambulatory Visit: Payer: Self-pay

## 2023-04-12 MED ORDER — METOPROLOL SUCCINATE ER 100 MG PO TB24: 100.0000 mg | ORAL_TABLET | Freq: Two times a day (BID) | ORAL | 3 refills | Status: DC

## 2023-04-13 ENCOUNTER — Encounter: Payer: Self-pay | Admitting: Behavioral Health

## 2023-04-13 ENCOUNTER — Ambulatory Visit (INDEPENDENT_AMBULATORY_CARE_PROVIDER_SITE_OTHER): Payer: Medicare (Managed Care) | Admitting: Behavioral Health

## 2023-04-13 DIAGNOSIS — F331 Major depressive disorder, recurrent, moderate: Secondary | ICD-10-CM | POA: Diagnosis not present

## 2023-04-13 DIAGNOSIS — F411 Generalized anxiety disorder: Secondary | ICD-10-CM | POA: Diagnosis not present

## 2023-04-13 NOTE — Progress Notes (Signed)
[facility] Counselor/Therapist Progress Note  Patient ID: Raychelle Whitehill, MRN: [id number],    Date: 04/13/2023  Time Spent: 55 minutes, 1103 to 11:58 AM spent with the patient.This session was held via video teletherapy. The patient consented to the video teletherapy and was located in her home during this session. She is aware it is the responsibility of the patient to secure confidentiality on her end of the session. The provider was in a private home  for the duration of this session.      Treatment Type: Individual Therapy  Reported Symptoms: Anxiety, depression  Mental Status Exam: Appearance:  Casual     Behavior: Appropriate  Motor: Normal  Speech/Language:  Normal Rate  Affect: Appropriate  Mood: normal  Thought process: normal  Thought content:   WNL  Sensory/Perceptual disturbances:   WNL  Orientation: oriented to person, place, time/date, situation, day of week, month of year, and year  Attention: Good  Concentration: Good  Memory: WNL  Fund of knowledge:  Good  Insight:   Good  Judgment:  Good  Impulse Control: Good   Risk Assessment: Danger to Self:  No Self-injurious Behavior: No Danger to Others: No Duty to Warn:no Physical Aggression / Violence:No  Access to Firearms a concern: No  Gang Involvement:No   Subjective: The patient has not slept well the last 2 nights because of heart rate fluctuation.  It can go down to 103 /104 and then up again t0 close to 150.  It can be difficult to sleep because she does not know how she is going to feel from 1 minute to the next.  She had an episode sitting at the breakfast table with sustained heart rate for a period of 10 minutes.  She still is scheduled for the heart ablation on August 28 and has not heard if there are any cancellations.  She was told that her initial procedure had to be rescheduled because a significant number of anesthesiologist walked off the job.  She is not sure if that was temporary  or not but it delayed her ablation for almost 2 months.  She is limited in what she can do because she gets out of breath easily.  Bending over to do anything such as unloading the dishwasher can make her lightheaded and not feel well.  Her daughter will take her to appointments but is not doing much else around the house and there is discord between her daughter and son-in-law.  The patient is trying to find sanctuary in her room as much as she can going downstairs to let the dog out and do a few small things but even that takes a lot of energy.  She is having a sleep some during the day as she is not sleeping well at night.  She is practicing some of the anxiety reduction techniques and I introduced the tips skill as well as progressive muscle relaxation encouraging her to practice those.  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  That something she can do in her room easily.  The patient does contract for safety having no thoughts of hurting herself or anyone else.  Interventions: Cognitive Behavioral Therapy  Diagnosis: Generalized anxiety disorder, major depressive disorder, recurrent, moderate.  Plan: I will meet with the patient weekly via care agility.  Treatment plan: We will use cognitive behavioral therapy as well as person centered  and supportive therapy in addition to elements of dialectical behavior therapy to help reduce the patient's anxiety and depression by at least 50% with a target date of October 06, 2023.  Goals for improving depression may include having less sadness as indicated by patient report and scores on the PHQ-9, have improved mood and return to a healthier level of functioning, identify causes including environment for depressed mood and learn ways to cope with depression especially those connected to her medical issues.  Interventions include using cognitive behavioral  therapy to explore and replace thoughts and behaviors.  We will look at how depression is experienced in day-to-day living and encouraged sharing of feelings.  We will encourage the use of coping skills for management of depressive symptoms.  Goals for reducing anxiety are to improve her ability to manage anxiety symptoms, better handle stress, identify causes for anxiety and explore ways to lower it, resolve the core conflicts contributing to anxiety as well as manage thoughts and worrisome thinking contributing to feelings of anxiety.  Interventions will include providing education about anxiety, facilitate problem solution skills to help her identify options for resolving stress, teach coping skills for managing anxiety as well as mindfulness and communication in terms of family to reduce her stress level, use cognitive behavior therapy to identify and change anxiety provoking thought and behavior patterns as well as teach distress tolerance and mindfulness skills for alleviating anxiety.  [name], [facility]                  [name], [facility]               [name], [facility]

## 2023-04-20 ENCOUNTER — Other Ambulatory Visit: Payer: Self-pay

## 2023-04-20 ENCOUNTER — Encounter: Payer: Self-pay | Admitting: Family Medicine

## 2023-04-20 ENCOUNTER — Telehealth: Payer: Self-pay | Admitting: Pharmacist

## 2023-04-20 ENCOUNTER — Ambulatory Visit: Payer: Medicare (Managed Care) | Admitting: Pharmacist

## 2023-04-20 DIAGNOSIS — I480 Paroxysmal atrial fibrillation: Secondary | ICD-10-CM

## 2023-04-20 DIAGNOSIS — N184 Chronic kidney disease, stage 4 (severe): Secondary | ICD-10-CM

## 2023-04-20 DIAGNOSIS — E1165 Type 2 diabetes mellitus with hyperglycemia: Secondary | ICD-10-CM

## 2023-04-20 MED ORDER — METFORMIN HCL ER 500 MG PO TB24: 500.0000 mg | ORAL_TABLET | Freq: Two times a day (BID) | ORAL | 0 refills | Status: DC

## 2023-04-20 NOTE — Telephone Encounter (Signed)
Spoke with patient, she is currently at [facility] to have pre-procedure labs drawn. Orders for CBC, BMP released to [facility]. Confirmed [facility] received orders.

## 2023-04-20 NOTE — Telephone Encounter (Signed)
Unsuccessful outreach attempt by Clinical Pharmacist Practitioner to follow up with patient regarding medication management and diabetes.  Line was busy x 2.  Patient called office but unable to transfer call to my extension.  Called patient back and still received busy signal.  Called patient using my cell phone and was able to reach her. See Phone visit notes.

## 2023-04-20 NOTE — Progress Notes (Signed)
04/20/2023 Name: Eulala Ishaq MRN: [id number] DOB: 1956-03-29  Chief Complaint  Patient presents with   Diabetes   Medication Management    Paula Stout is a 67 y.o. year old female.   They were referred to the pharmacist by their PCP for assistance in managing medication access and complex medication management.   Subjective: Patient has  been approved for Extra Help from Medicare. Medications are now $0, $4 or $11.20.   Patient reports affordability concerns with their medications: No  - improved since she was approved for LIS / Medicare Extra Help Patient reports access/transportation concerns to their pharmacy: No  Patient reports adherence concerns with their medications:  No  -     Type 2 DM:  Current therapy - metformin ER 500mg  - take 2 tablets daily (but patient reports she is taking 2 tablets twice a day). Trulicity 1.5mg  weekly (dose increased last month) and Farxiga 10mg  daily started 02/25/2023.   Previous medications tried: Ozempic - stopped due to nausea.   Home blood glucose: Morning 120, 94, 104, 96 Afternoon / evening: 153, 139, 142, 128   CKD -  Serum creatinine and eGFR had worsened from 06/18 to 06/24.  - eGFR was 22 (02/28/2023). Per nephrology thought changes in Scr were related to afib / low blood pressure and low perfusion.  Lisinopril was stopped 03/23/2023 by Dr Kathrene Bongo.  Follow up Scr was checked 04/05/2023 and was found to be 1.2  Hypertension / Afib:  Current therapy: metoprolol succinate 100mg  twice a day with metoprolol 25mg  if needed for elevated HR or diltiazem 30mg  as needed for HR ? 95 as long as SBP > 100.   Ablation is scheduled for 05/04/2023  Anticoagulation therapy: Eliquis 5mg  twice a day (Age = 67 yo; Scr = 2.28; weight = 100.5 kg)  BP Readings from Last 3 Encounters:  03/04/23 114/75  02/28/23 128/80  02/22/23 110/70   CHA2DS2-VASc Score = 3  The patient's score is based upon: CHF History: 0 HTN History:  0 Diabetes History: 1 Stroke History: 0 Vascular Disease History: 0 Age Score: 1 Gender Score: 1  Medication management:  Patient states since Viibryd dose was increased to 40mg  daily she has had more nausea and diarrhea.   Patient also states she will not be able to continue to get Botox injections for migraine headaches due to cost. It looks like Dr Everlena Cooper has prescribed Nurtec to take as needed for headache and she is still taking topiramate 75mg  twice a day.   Objective:  Lab Results  Component Value Date   HGBA1C 6.2 11/22/2022    Lab Results  Component Value Date   CREATININE 2.28 (H) 02/28/2023   BUN 29 (H) 02/28/2023   NA 138 02/28/2023   K 4.4 02/28/2023   CL 105 02/28/2023   CO2 21 02/28/2023    Lab Results  Component Value Date   CHOL 136 11/22/2022   HDL 34.30 (L) 11/22/2022   LDLCALC 67 11/22/2022   TRIG 172.0 (H) 11/22/2022   CHOLHDL 4 11/22/2022    Medications Reviewed Today     Reviewed by Henrene Pastor, RPH-CPP (Pharmacist) on 04/20/23 at 1046  Med List Status: <None>   Medication Order Taking? Sig Documenting Provider Last Dose Status Informant  acetaminophen (TYLENOL) 500 MG tablet [id number]  Take 1,000 mg by mouth every 6 (six) hours as needed for mild pain. [provider]  Active Self  albuterol (VENTOLIN HFA) 108 (90 Base) MCG/ACT inhaler [id number] Yes Inhale 2  puffs into the lungs every 4 (four) hours as needed for wheezing. Sharlene Dory, DO Taking Active Self  atorvastatin (LIPITOR) 80 MG tablet [id number] Yes TAKE 1 TABLET DAILY Sharlene Dory, DO Taking Active   blood glucose meter kit and supplies [id number] Yes One Touch Ultra  Check blood sugars once daily Dx: E11.9  Patient taking differently: 1 each by Other route See admin instructions. One Touch Ultra  Check blood sugars once daily Dx: E11.9   Sharlene Dory, DO Taking Active Self  botulinum toxin Type A (BOTOX) 200 units injection [id number] No  Inject 155 units IM into multiple site in the face,neck and head once every 90 days  Patient not taking: Reported on 04/20/2023   Drema Dallas, DO Not Taking Active Self  buPROPion (WELLBUTRIN XL) 300 MG 24 hr tablet [id number] Yes TAKE 1 TABLET DAILY Sharlene Dory, DO Taking Active   Cholecalciferol (VITAMIN D3) 125 MCG (5000 UT) capsule [id number] Yes Take 5,000 Units by mouth daily. [provider] Taking Active Self  Cyanocobalamin (VITAMIN B-12) 5000 MCG TBDP [id number] Yes Take 5,000 mcg by mouth daily. [provider] Taking Active Self  dapagliflozin propanediol (FARXIGA) 10 MG TABS tablet [id number] Yes Take 1 tablet (10 mg total) by mouth daily. Sharlene Dory, DO Taking Active   diltiazem (CARDIZEM) 30 MG tablet [id number] Yes Take 1 tablet every 4 hours AS NEEDED for AFIB or SVT heart rate >95 as long as top BP >100.  Patient taking differently: Take 30 mg by mouth every 4 (four) hours as needed (see below). Take 1 tablet every 4 hours AS NEEDED for AFIB or SVT heart rate >95 as long as top BP >100.   Sherie Don, NP Taking Active   diphenhydrAMINE (BENADRYL) 25 MG tablet [id number]  Take 25 mg by mouth daily as needed for allergies (seasonal). [provider]  Active Self           Med Note Nedra Hai   Fri Dec 03, 2022 10:47 AM)    Dulaglutide (TRULICITY) 1.5 MG/0.5ML SOPN [id number] Yes Inject 1.5 mg into the skin once a week. Sharlene Dory, DO Taking Active   ELIQUIS 5 MG TABS tablet [id number] Yes TAKE 1 TABLET TWICE A DAY Sharlene Dory, DO Taking Active   EPINEPHrine 0.3 mg/0.3 mL IJ SOAJ injection [id number]  Inject 0.3 mg into the muscle as needed for anaphylaxis. Sharlene Dory, DO  Active Self  ezetimibe (ZETIA) 10 MG tablet [id number] Yes TAKE 1 TABLET DAILY Sharlene Dory, DO Taking Active   fenofibrate 160 MG tablet [id number] Yes Take 1 tablet (160 mg total) by mouth daily. Sharlene Dory, DO Taking Active   fluticasone (FLONASE) 50 MCG/ACT nasal spray [id number] No Place 2 sprays into both nostrils daily.  Patient not taking: Reported on 04/20/2023   Junie Spencer, FNP Not Taking Active   levocetirizine (XYZAL) 5 MG tablet [id number] Yes TAKE 1 TABLET EVERY EVENING Sharlene Dory, DO Taking Active   Lurasidone HCl 120 MG TABS [id number] Yes Take 1 tablet (120 mg total) by mouth at bedtime. Sharlene Dory, DO Taking Active Self  magnesium oxide (MAG-OX) 400 (240 Mg) MG tablet [id number] Yes Take 400 mg by mouth daily. [provider] Taking Active Self  metFORMIN (GLUCOPHAGE-XR) 500 MG 24 hr tablet [id number] Yes Take 2 tablets (1,000 mg total) by mouth 2 (two) times daily with a meal. Carmelia Roller, Jilda Roche, DO Taking Active  metoprolol succinate (TOPROL-XL) 100 MG 24 hr tablet [id number] Yes Take 1 tablet (100 mg total) by mouth 2 (two) times daily. Take with or immediately following a meal. Camnitz, Will Daphine Deutscher, MD Taking Active   metoprolol tartrate (LOPRESSOR) 25 MG tablet [id number] Yes Take 1 tablet (25 mg total) by mouth every 4 (four) hours as needed (PALPITATIONS). Regan Lemming, MD Taking Active Self  nitroGLYCERIN (NITROSTAT) 0.4 MG SL tablet [id number]  Place 1 tablet (0.4 mg total) under the tongue every 5 (five) minutes as needed for chest pain. Georgeanna Lea, MD  Active   ondansetron (ZOFRAN-ODT) 4 MG disintegrating tablet [id number] Yes Take 1 tablet (4 mg total) by mouth every 8 (eight) hours as needed for nausea or vomiting. Sharlene Dory, DO Taking Active   [facility] VERIO test strip [id number] Yes USE DAILY TO CHECK BLOOD SUGAR Sharlene Dory, DO Taking Active   pantoprazole (PROTONIX) 40 MG tablet [id number] Yes Take 1 tablet (40 mg total) by mouth daily. Sharlene Dory, DO Taking Active   QUEtiapine (SEROQUEL) 300 MG tablet [id number] Yes Take 1 tablet (300 mg total) by mouth at bedtime.  Sharlene Dory, DO Taking Active Self  Rimegepant Sulfate (NURTEC) 75 MG TBDP [id number] Yes Take 1 tablet (75 mg total) by mouth as needed (take 1 tab at the earlist onset of a migraine. Max 1 tab in 24 hours). Drema Dallas, DO Taking Active   topiramate (TOPAMAX) 50 MG tablet [id number] Yes TAKE 3 TABLETS TWICE A DAY  Patient taking differently: Take 75 mg by mouth 2 (two) times daily.   Sharlene Dory, DO Taking Active   Vilazodone HCl (VIIBRYD) 40 MG TABS [id number] Yes Take 1 tablet (40 mg total) by mouth daily. Joan Flores, NP Taking Active               Assessment/Plan:  Type 2 DM with CKD: home blood glucose readings improved with recent med changes; increase in Scr could be bump related to [place] start or low blood pressure / Afib - Updated Rx for metformin ER 500mg  take 2 tablets ONCE a day - Continue Farxiga 10mg  daily.. Continue to monitor for Mycotic infections and increase in frequency of genital infections.   - Continue Trulicity to 1.5mg  weekly next week.  - Patient has daibetic eye exam completed 03/30/2023 but noted that was not recorded in Health Maintenance. Messaged Weldon Picking so she could abstract visit notes.  Hypertension / Afib:  Continue metoprolol 100mg  twice a day Continue Eliquis 5mg  twice a day Reviewed recommendation for holding Trulicity, Farxiga and metoprolol prior to ablation provided by cardiology. Patient understands directions.   Medication Management -  Approved for LIS/ Extra Help thru 2024.  Recommended she try taking Viibryd / vilazodone with largest meal of the day (either lunch or supper) to see if nausea and loose stools improve. If not change she should contact psych provider Avelina Laine, NP)  Patient to discuss with Dr Everlena Cooper possibly taking Nurtec as preventative - 75mg  every OTHER day since she will no longer be able to get Botox injections.    Meds ordered this encounter  Medications   metFORMIN (GLUCOPHAGE-XR)  500 MG 24 hr tablet    Sig: Take 1 tablet (500 mg total) by mouth 2 (two) times daily with a meal.    Dispense:  180 tablet    Refill:  0    Note decrease in dose from 4 to 2 tabs per day  Follow Up Plan: December 2024; follow up planned for September to see PCP.   Henrene Pastor, PharmD Clinical Pharmacist [facility]

## 2023-04-21 ENCOUNTER — Ambulatory Visit (INDEPENDENT_AMBULATORY_CARE_PROVIDER_SITE_OTHER): Payer: Medicare (Managed Care) | Admitting: Behavioral Health

## 2023-04-21 ENCOUNTER — Encounter (INDEPENDENT_AMBULATORY_CARE_PROVIDER_SITE_OTHER): Payer: Self-pay

## 2023-04-21 DIAGNOSIS — F331 Major depressive disorder, recurrent, moderate: Secondary | ICD-10-CM | POA: Diagnosis not present

## 2023-04-21 DIAGNOSIS — F411 Generalized anxiety disorder: Secondary | ICD-10-CM | POA: Diagnosis not present

## 2023-04-21 LAB — BASIC METABOLIC PANEL
BUN/Creatinine Ratio: 7 — ABNORMAL LOW (ref 12–28)
BUN: 14 mg/dL (ref 8–27)
CO2: 21 mmol/L (ref 20–29)
Calcium: 10.3 mg/dL (ref 8.7–10.3)
Chloride: 106 mmol/L (ref 96–106)
Creatinine, Ser: 1.89 mg/dL — ABNORMAL HIGH (ref 0.57–1.00)
Glucose: 147 mg/dL — ABNORMAL HIGH (ref 70–99)
Potassium: 4.8 mmol/L (ref 3.5–5.2)
Sodium: 141 mmol/L (ref 134–144)
eGFR: 29 mL/min/{1.73_m2} — ABNORMAL LOW (ref >59)

## 2023-04-21 LAB — CBC
Hematocrit: 38.3 % (ref 34.0–46.6)
Hemoglobin: 12.4 g/dL (ref 11.1–15.9)
MCH: 29.9 pg (ref 26.6–33.0)
MCHC: 32.4 g/dL (ref 31.5–35.7)
MCV: 92 fL (ref 79–97)
Platelets: 324 10*3/uL (ref 150–450)
RBC: 4.15 x10E6/uL (ref 3.77–5.28)
RDW: 13.7 % (ref 11.7–15.4)
WBC: 7.3 10*3/uL (ref 3.4–10.8)

## 2023-04-21 NOTE — Progress Notes (Signed)
[facility] Counselor/Therapist Progress Note  Patient ID: Paula Stout, MRN: [id number],    Date: 04/21/2023  Time Spent: 56 minutes, 301 to 3:57 PM.  Spent with the patient.This session was held via video teletherapy. The patient consented to the video teletherapy and was located in her home during this session. She is aware it is the responsibility of the patient to secure confidentiality on her end of the session. The provider was in a private home  for the duration of this session.      Treatment Type: Individual Therapy  Reported Symptoms: Anxiety, depression  Mental Status Exam: Appearance:  Casual     Behavior: Appropriate  Motor: Normal  Speech/Language:  Normal Rate  Affect: Appropriate  Mood: normal  Thought process: normal  Thought content:   WNL  Sensory/Perceptual disturbances:   WNL  Orientation: oriented to person, place, time/date, situation, day of week, month of year, and year  Attention: Good  Concentration: Good  Memory: WNL  Fund of knowledge:  Good  Insight:   Good  Judgment:  Good  Impulse Control: Good   Risk Assessment: Danger to Self:  No Self-injurious Behavior: No Danger to Others: No Duty to Warn:no Physical Aggression / Violence:No  Access to Firearms a concern: No  Gang Involvement:No   Subjective: The patient said the past 4 days she has been extremely depressed.  She does contract for safety saying she has no thoughts of hurting herself but has been overwhelmed.  She has basically stayed in her room with the exception of taking her dog out.  She has eaten a cup of noodles only for 3 to 4 days until today when she did eat something else.  She is staying hydrated.  She did not take her medication until last night for 3 to 4 days.  Her home environment is what is leading to her being overwhelmed.  Every boundaries she has tried to set with her daughter and son-in-law has been ignored.  Primarily the only help her daughter gives  her is taking her to doctor's appointments as needed.  The patient had drive herself to do blood work today.  She had to drive her daughter to the emergency room a couple nights ago because her daughter having migraines.  The patient does not feel well enough to cook because of her heart issues.  The daughter and son-in-law are not taking care of the house or taking care of 2 very large breed dogs.  She says the house smells like cat and dog urine and feces a lot of the time.  The patient can only stand to be downstairs for a few minutes of time is trying to get outside for 30 minutes or so especially early in the morning but later in the evening if weather and heat permit.  Her heart ablation is scheduled for August 28 so starting on August 20 she cannot take a lot of her medications including her metoprolol because they want to see the episodes that she is having to benefit them for the ablation on the 28th.  She cannot take her mental health meds for a certain amount of time before the ablation.  I expressed my serious concern about her mental physical and emotional health being in the environment that she is in.  She will need about a month to recover from the ablation so she knows she has a few options then but I encouraged her to strongly speak to her daughter in [place]  about finding a way for the patient to move out there with one of her children.  She has a good relationship with most of her children 3 of which are in [place] and 2 of which are in [place].  She knows that she needs to be in a better environment.  In the interim I encouraged her to reach out to her children and grandchildren on a consistent if not daily basis as a distraction that is a healthy coping skill.  She is reading as much as she can listening to music as much as she can.  She is staying in her room more to avoid the environment as well as interaction with her daughter and son-in-law and the 2 bigger dogs as she has a cold  chill while while he does not like those 2 dogs.  We talked about the importance of her exercises for reducing anxiety as well as challenging depressive thoughts.  She is aware of her depression and says she will contract for safety and do as much as she can to cope.  She does contract for safety and I will see her weekly. Interventions: Cognitive Behavioral Therapy  Diagnosis: Generalized anxiety disorder, major depressive disorder, recurrent, moderate.  Plan: I will meet with the patient weekly via care agility.  Treatment plan: We will use cognitive behavioral therapy as well as person centered and supportive therapy in addition to elements of dialectical behavior therapy to help reduce the patient's anxiety and depression by at least 50% with a target date of October 06, 2023.  Goals for improving depression may include having less sadness as indicated by patient report and scores on the PHQ-9, have improved mood and return to a healthier level of functioning, identify causes including environment for depressed mood and learn ways to cope with depression especially those connected to her medical issues.  Interventions include using cognitive behavioral therapy to explore and replace thoughts and behaviors.  We will look at how depression is experienced in day-to-day living and encouraged sharing of feelings.  We will encourage the use of coping skills for management of depressive symptoms.  Goals for reducing anxiety are to improve her ability to manage anxiety symptoms, better handle stress, identify causes for anxiety and explore ways to lower it, resolve the core conflicts contributing to anxiety as well as manage thoughts and worrisome thinking contributing to feelings of anxiety.  Interventions will include providing education about anxiety, facilitate problem solution skills to help her identify options for resolving stress, teach coping skills for managing anxiety as well as mindfulness and  communication in terms of family to reduce her stress level, use cognitive behavior therapy to identify and change anxiety provoking thought and behavior patterns as well as teach distress tolerance and mindfulness skills for alleviating anxiety.  [name], [facility]                  [name], [facility] [place]               [name], [facility]               [name], [facility]

## 2023-04-25 NOTE — Pre-Procedure Instructions (Signed)
Patient scheduled for procedure on 8/28 with anesthesia.  Anesthesia requires certain medications for be held for 7 days before procedure.  Trulicity,  don't take after Tuesday 8/20.

## 2023-04-27 ENCOUNTER — Encounter: Payer: Self-pay | Admitting: Behavioral Health

## 2023-04-27 ENCOUNTER — Ambulatory Visit (INDEPENDENT_AMBULATORY_CARE_PROVIDER_SITE_OTHER): Payer: Medicare (Managed Care) | Admitting: Behavioral Health

## 2023-04-27 DIAGNOSIS — F411 Generalized anxiety disorder: Secondary | ICD-10-CM

## 2023-04-27 NOTE — Progress Notes (Signed)
[facility] Counselor/Therapist Progress Note  Patient ID: Paula Stout, MRN: [id number],    Date: 04/27/2023  Time Spent: 56 minutes, 301 to 3:57 PM.  Spent with the patient.This session was held via video teletherapy. The patient consented to the video teletherapy and was located in her home during this session. She is aware it is the responsibility of the patient to secure confidentiality on her end of the session. The provider was in a private home  for the duration of this session.      Treatment Type: Individual Therapy  Reported Symptoms: Anxiety, depression  Mental Status Exam: Appearance:  Casual     Behavior: Appropriate  Motor: Normal  Speech/Language:  Normal Rate  Affect: Appropriate  Mood: normal  Thought process: normal  Thought content:   WNL  Sensory/Perceptual disturbances:   WNL  Orientation: oriented to person, place, time/date, situation, day of week, month of year, and year  Attention: Good  Concentration: Good  Memory: WNL  Fund of knowledge:  Good  Insight:   Good  Judgment:  Good  Impulse Control: Good   Risk Assessment: Danger to Self:  No Self-injurious Behavior: No Danger to Others: No Duty to Warn:no Physical Aggression / Violence:No  Access to Firearms a concern: No  Gang Involvement:No   Subjective: The patient reports that her depression has been less intense over the past week.  She did speak to her daughter who lives in [place] giving her a little more detail about her living environment.  Her daughter agrees there will be best for the patient to move back out that way and they started talking about how they can make that work in terms of accommodations as well as finances.  The plan is for her son to fly out to [place] and drive back with the patient and her dog.  She is optimistic that they can start to make that work but understands her has to be at least a month of recovery after her surgery but that will  give him time to look into cost for flights etc.  Her surgery is a week from today at 1130 arrival time at about 230 surgery time.  She has some anxiety because she will be partially awake because they are going to see what the episodes actually look like.  She understands the importance of that but also some anxiety of having to go through that knowing that it will give him a better idea of how where etc. to do the ablation.  Her daughter is going with her.  As of this Friday she has to come off of her metoprolol so she knows there is an increased risk of some of these episodes and that she may find it difficult and have a hard time sleeping so we talked about the importance of using coping skills as much as can to keep her anxiety level down.  She has been sleeping a lot and says that is okay because she knows that time will pass faster and she does not typically have the episodes while sleeping.  She would not be able to list more than 10 pounds her dog weighs about 10 pounds so she is working with him on him walking up and down the stairs with the possibility of picking them up to go through the rest of the house if the other big dogs are out.  There was a situation in which one of the bigger dogs wanted to attack her dog  and she and her daughter had to hold a big dog while her daughter finished eating.  We talked about the importance of self-care especially after the surgery.  She is aware that she can call if she needs anything before the surgery during the week but we are scheduled for an appointment the day after her surgery. We talked about the importance of her exercises for reducing anxiety as well as challenging depressive thoughts.  She is aware of her depression and says she will contract for safety and do as much as she can to cope.  She does contract for safety and I will see her weekly. Interventions: Cognitive Behavioral Therapy  Diagnosis: Generalized anxiety disorder, major depressive  disorder, recurrent, moderate.  Plan: I will meet with the patient weekly via care agility.  Treatment plan: We will use cognitive behavioral therapy as well as person centered and supportive therapy in addition to elements of dialectical behavior therapy to help reduce the patient's anxiety and depression by at least 50% with a target date of October 06, 2023.  Goals for improving depression may include having less sadness as indicated by patient report and scores on the PHQ-9, have improved mood and return to a healthier level of functioning, identify causes including environment for depressed mood and learn ways to cope with depression especially those connected to her medical issues.  Interventions include using cognitive behavioral therapy to explore and replace thoughts and behaviors.  We will look at how depression is experienced in day-to-day living and encouraged sharing of feelings.  We will encourage the use of coping skills for management of depressive symptoms.  Goals for reducing anxiety are to improve her ability to manage anxiety symptoms, better handle stress, identify causes for anxiety and explore ways to lower it, resolve the core conflicts contributing to anxiety as well as manage thoughts and worrisome thinking contributing to feelings of anxiety.  Interventions will include providing education about anxiety, facilitate problem solution skills to help her identify options for resolving stress, teach coping skills for managing anxiety as well as mindfulness and communication in terms of family to reduce her stress level, use cognitive behavior therapy to identify and change anxiety provoking thought and behavior patterns as well as teach distress tolerance and mindfulness skills for alleviating anxiety.  [name], [facility]                  [name], [facility]               [name], [facility]               [name],  [facility]               [name], [facility]

## 2023-05-03 ENCOUNTER — Other Ambulatory Visit: Payer: Self-pay | Admitting: Medical Genetics

## 2023-05-03 DIAGNOSIS — Z006 Encounter for examination for normal comparison and control in clinical research program: Secondary | ICD-10-CM

## 2023-05-03 NOTE — Pre-Procedure Instructions (Signed)
Attempted to call patient regarding procedure instructions.  Left voicemail on the following items.   Arrival time 1100 Nothing to eat or drink after midnight No meds AM of procedure Responsible person to drive you home and stay with you for 24 hrs  Have you missed any doses of anti-coagulant Eliquis

## 2023-05-04 ENCOUNTER — Encounter (HOSPITAL_COMMUNITY)
Admission: RE | Disposition: A | Discharge status: Home / Self Care | Payer: Medicare (Managed Care) | Source: Home / Self Care | Attending: Cardiology

## 2023-05-04 ENCOUNTER — Other Ambulatory Visit: Payer: Self-pay

## 2023-05-04 ENCOUNTER — Ambulatory Visit (HOSPITAL_COMMUNITY): Payer: Medicare (Managed Care) | Admitting: Anesthesiology

## 2023-05-04 ENCOUNTER — Ambulatory Visit (HOSPITAL_COMMUNITY)
Admission: RE | Admit: 2023-05-04 | Discharge: 2023-05-04 | Disposition: A | Discharge status: Home / Self Care | Payer: Medicare (Managed Care) | Attending: Cardiology | Admitting: Cardiology

## 2023-05-04 DIAGNOSIS — E1122 Type 2 diabetes mellitus with diabetic chronic kidney disease: Secondary | ICD-10-CM | POA: Diagnosis not present

## 2023-05-04 DIAGNOSIS — Z79899 Other long term (current) drug therapy: Secondary | ICD-10-CM | POA: Diagnosis not present

## 2023-05-04 DIAGNOSIS — Z7901 Long term (current) use of anticoagulants: Secondary | ICD-10-CM | POA: Diagnosis not present

## 2023-05-04 DIAGNOSIS — I480 Paroxysmal atrial fibrillation: Secondary | ICD-10-CM | POA: Insufficient documentation

## 2023-05-04 DIAGNOSIS — D6869 Other thrombophilia: Secondary | ICD-10-CM | POA: Diagnosis not present

## 2023-05-04 DIAGNOSIS — I129 Hypertensive chronic kidney disease with stage 1 through stage 4 chronic kidney disease, or unspecified chronic kidney disease: Secondary | ICD-10-CM | POA: Diagnosis not present

## 2023-05-04 DIAGNOSIS — I471 Supraventricular tachycardia, unspecified: Secondary | ICD-10-CM | POA: Insufficient documentation

## 2023-05-04 DIAGNOSIS — N183 Chronic kidney disease, stage 3 unspecified: Secondary | ICD-10-CM | POA: Diagnosis not present

## 2023-05-04 HISTORY — PX: SVT ABLATION: EP1225

## 2023-05-04 LAB — GLUCOSE, CAPILLARY
Glucose-Capillary: 153 mg/dL — ABNORMAL HIGH (ref 70–99)
Glucose-Capillary: 99 mg/dL (ref 70–99)

## 2023-05-04 SURGERY — SVT ABLATION
Anesthesia: Monitor Anesthesia Care

## 2023-05-04 MED ORDER — BUPIVACAINE HCL (PF) 0.25 % IJ SOLN
INTRAMUSCULAR | Status: DC | PRN
  Administered 2023-05-04: 30 mL

## 2023-05-04 MED ORDER — ACETAMINOPHEN 500 MG PO TABS
1000.0000 mg | ORAL_TABLET | Freq: Once | ORAL | Status: AC
  Administered 2023-05-04: 1000 mg via ORAL
  Filled 2023-05-04: qty 2

## 2023-05-04 MED ORDER — FENTANYL CITRATE (PF) 100 MCG/2ML IJ SOLN: 25.0000 ug | INTRAMUSCULAR | Status: DC | PRN

## 2023-05-04 MED ORDER — PROMETHAZINE HCL 25 MG/ML IJ SOLN: 6.2500 mg | INTRAMUSCULAR | Status: DC | PRN

## 2023-05-04 MED ORDER — FENTANYL CITRATE (PF) 100 MCG/2ML IJ SOLN
INTRAMUSCULAR | Status: DC | PRN
  Administered 2023-05-04: 100 ug via INTRAVENOUS

## 2023-05-04 MED ORDER — MIDAZOLAM HCL 2 MG/2ML IJ SOLN: 0.5000 mg | Freq: Once | INTRAMUSCULAR | Status: DC | PRN

## 2023-05-04 MED ORDER — HEPARIN (PORCINE) IN NACL 1000-0.9 UT/500ML-% IV SOLN
INTRAVENOUS | Status: DC | PRN
  Administered 2023-05-04 (×2): 500 mL

## 2023-05-04 MED ORDER — OXYCODONE HCL 5 MG PO TABS
ORAL_TABLET | ORAL | Status: AC
  Administered 2023-05-04: 5 mg via ORAL
  Filled 2023-05-04: qty 1

## 2023-05-04 MED ORDER — OXYCODONE HCL 5 MG PO TABS: 5.0000 mg | ORAL_TABLET | Freq: Once | ORAL | Status: AC | PRN

## 2023-05-04 MED ORDER — PROPOFOL 10 MG/ML IV BOLUS
INTRAVENOUS | Status: DC | PRN
Start: 2023-05-04 — End: 2023-05-04
  Administered 2023-05-04: 30 mg via INTRAVENOUS

## 2023-05-04 MED ORDER — ACETAMINOPHEN 325 MG PO TABS: 650.0000 mg | ORAL_TABLET | ORAL | Status: DC | PRN

## 2023-05-04 MED ORDER — PROPOFOL 500 MG/50ML IV EMUL
INTRAVENOUS | Status: DC | PRN
  Administered 2023-05-04: 70 ug/kg/min via INTRAVENOUS

## 2023-05-04 MED ORDER — SODIUM CHLORIDE 0.9 % IV SOLN: INTRAVENOUS | Status: DC

## 2023-05-04 MED ORDER — ONDANSETRON HCL 4 MG/2ML IJ SOLN
INTRAMUSCULAR | Status: DC | PRN
  Administered 2023-05-04: 4 mg via INTRAVENOUS

## 2023-05-04 MED ORDER — ONDANSETRON HCL 4 MG/2ML IJ SOLN: 4.0000 mg | Freq: Four times a day (QID) | INTRAMUSCULAR | Status: DC | PRN

## 2023-05-04 MED ORDER — SODIUM CHLORIDE 0.9 % IV SOLN: INTRAVENOUS | Status: DC | PRN

## 2023-05-04 MED ORDER — PHENYLEPHRINE 80 MCG/ML (10ML) SYRINGE FOR IV PUSH (FOR BLOOD PRESSURE SUPPORT)
PREFILLED_SYRINGE | INTRAVENOUS | Status: DC | PRN
  Administered 2023-05-04 (×2): 80 ug via INTRAVENOUS

## 2023-05-04 MED ORDER — BUPIVACAINE HCL (PF) 0.25 % IJ SOLN
INTRAMUSCULAR | Status: AC
  Filled 2023-05-04: qty 30

## 2023-05-04 MED ORDER — MIDAZOLAM HCL 2 MG/2ML IJ SOLN
INTRAMUSCULAR | Status: DC | PRN
  Administered 2023-05-04: 2 mg via INTRAVENOUS

## 2023-05-04 MED ORDER — OXYCODONE HCL 5 MG/5ML PO SOLN: 5.0000 mg | Freq: Once | ORAL | Status: AC | PRN

## 2023-05-04 SURGICAL SUPPLY — 13 items
BAG SNAP BAND KOVER 36X36 (MISCELLANEOUS) IMPLANT
CATH EZ STEER NAV 4MM D-F CUR (ABLATOR) IMPLANT
CATH JOSEPH QUAD ALLRED 6F REP (CATHETERS) IMPLANT
CATH WEBSTER BI DIR CS D-F CRV (CATHETERS) IMPLANT
CLOSURE MYNX CONTROL 6F/7F (Vascular Products) IMPLANT
PACK EP LATEX FREE (CUSTOM PROCEDURE TRAY) ×1
PACK EP LF (CUSTOM PROCEDURE TRAY) ×1 IMPLANT
PAD DEFIB RADIO PHYSIO CONN (PAD) ×1 IMPLANT
PATCH CARTO3 (PAD) IMPLANT
SHEATH PINNACLE 6F 10CM (SHEATH) IMPLANT
SHEATH PINNACLE 7F 10CM (SHEATH) IMPLANT
SHEATH PINNACLE 8F 10CM (SHEATH) IMPLANT
SHEATH PROBE COVER 6X72 (BAG) IMPLANT

## 2023-05-04 NOTE — H&P (Signed)
  Electrophysiology Office Note:   Date:  05/04/2023  ID:  Paula Stout, DOB May 16, 1956, MRN [id number]  Primary Cardiologist: Gypsy Balsam, MD Electrophysiologist: Regan Lemming, MD      History of Present Illness:   Paula Stout is a 67 y.o. female with h/o atrial fibrillation and SVT seen today for routine electrophysiology followup.  Since last being seen in our clinic the patient reports doing okay.  She has not had any further episodes of atrial fibrillation since her ablation.  Ablation was 10/27/2022.  She has had further episodes of SVT.  During these episodes, she feels near syncopal.  During the EP study, SVT was not induced.  She has had multiple episodes since her ablation.  Today, denies symptoms of palpitations, chest pain, shortness of breath, orthopnea, PND, lower extremity edema, claudication, dizziness, presyncope, syncope, bleeding, or neurologic sequela. The patient is tolerating medications without difficulties. Plan SVT ablation today.   Studies Reviewed:    EKG is ordered today. Personal review shows sinus rhythm, rate 68   Risk Assessment/Calculations:    CHA2DS2-VASc Score = 3   This indicates a 3.2% annual risk of stroke. The patient's score is based upon: CHF History: 0 HTN History: 0 Diabetes History: 1 Stroke History: 0 Vascular Disease History: 0 Age Score: 1 Gender Score: 1   Physical Exam:   VS:  BP (!) 144/90   Pulse 85   Temp 97.7 F (36.5 C) (Temporal)   Resp 16   Ht 5\' 6"  (1.676 m)   Wt 96.6 kg   SpO2 96%   BMI 34.38 kg/m    Wt Readings from Last 3 Encounters:  05/04/23 96.6 kg  03/04/23 100.2 kg  02/28/23 100.5 kg     GEN: Well nourished, well developed in no acute distress NECK: No JVD; No carotid bruits CARDIAC: Regular rate and rhythm, no murmurs, rubs, gallops RESPIRATORY:  Clear to auscultation without rales, wheezing or rhonchi  ABDOMEN: Soft, non-tender, non-distended EXTREMITIES:  No edema; No deformity    ASSESSMENT AND PLAN:    1.  Paroxysmal atrial fibrillation: Currently on metoprolol and Eliquis.  Status post ablation 10/27/2022.  She has had no further episodes of atrial fibrillation.  Paula Stout continue with current management.  2.  Secondary hypercoagulable state: Currently on Eliquis for atrial fibrillation  3.  SVT: Paula Stout has presented today for surgery, with the diagnosis of SVT.  The various methods of treatment have been discussed with the patient and family. After consideration of risks, benefits and other options for treatment, the patient has consented to  Procedure(s): Catheter ablation as a surgical intervention .  Risks include but not limited to complete heart block, stroke, esophageal damage, nerve damage, bleeding, vascular damage, tamponade, perforation, MI, and death. The patient's history has been reviewed, patient examined, no change in status, stable for surgery.  I have reviewed the patient's chart and labs.  Questions were answered to the patient's satisfaction.    Paula Stout Paula Fortis, MD 05/04/2023 12:24 PM

## 2023-05-04 NOTE — Anesthesia Postprocedure Evaluation (Signed)
Anesthesia Post Note  Patient: Paula Stout  Procedure(s) Performed: SVT ABLATION     Patient location during evaluation: Phase II Anesthesia Type: MAC Level of consciousness: oriented, patient cooperative and awake and alert Pain management: pain level controlled Vital Signs Assessment: post-procedure vital signs reviewed and stable Respiratory status: nonlabored ventilation, spontaneous breathing and respiratory function stable Cardiovascular status: blood pressure returned to baseline and stable Postop Assessment: no apparent nausea or vomiting and adequate PO intake Anesthetic complications: no   There were no known notable events for this encounter.  Last Vitals:  Vitals:   05/04/23 1600 05/04/23 1630  BP: 122/83 136/84  Pulse: 90 85  Resp: (!) 24 14  Temp:    SpO2: 96% 96%    Last Pain:  Vitals:   05/04/23 1530  TempSrc: Temporal  PainSc: 8                  Ula Couvillon,E. Mckay Brandt

## 2023-05-04 NOTE — Anesthesia Preprocedure Evaluation (Addendum)
Anesthesia Evaluation  Patient identified by MRN, date of birth, ID band Patient awake    Reviewed: Allergy & Precautions, NPO status , Patient's Chart, lab work & pertinent test results  History of Anesthesia Complications (+) AWARENESS UNDER ANESTHESIA  Airway Mallampati: III  TM Distance: >3 FB Neck ROM: Full    Dental  (+) Dental Advisory Given, Chipped   Pulmonary asthma , COPD,  COPD inhaler   breath sounds clear to auscultation       Cardiovascular hypertension, Pt. on medications and Pt. on home beta blockers (-) angina (-) CAD + dysrhythmias Atrial Fibrillation and Supra Ventricular Tachycardia  Rhythm:Regular Rate:Normal  '23 ECHO: EF 50 to 55%.  1.The LV has low normal function, no regional wall motion abnormalities. There is mild LVH, Grade II diastolic dysfunction (pseudonormalization).   2. RVF is normal. The right ventricular size is normal. There is normal pulmonary artery systolic pressure.   3. Left atrial size was mildly dilated.   4. Right atrial size was mildly dilated.   5. The mitral valve is normal in structure. No evidence of MR. No evidence of MS.   6. TR is mild to moderate.   7. The aortic valve is normal in structure. AI is not visualized. No aortic stenosis    '23 Myoview:  The study is normal. The study is low risk.Left ventricular function is normal. Nuclear stress EF: 61 %. The LV EF is normal (55-65%)     Neuro/Psych  Headaches PSYCHIATRIC DISORDERS (PTSD) Anxiety Depression Bipolar Disorder      GI/Hepatic Neg liver ROS,GERD  Medicated and Controlled,,  Endo/Other  diabetes (glu 153), Oral Hypoglycemic Agents  BMI 34.4  Renal/GU Renal InsufficiencyRenal disease     Musculoskeletal   Abdominal   Peds  Hematology eliquis   Anesthesia Other Findings   Reproductive/Obstetrics                             Anesthesia Physical Anesthesia Plan  ASA:  3  Anesthesia Plan: MAC   Post-op Pain Management: Tylenol PO (pre-op)* and Minimal or no pain anticipated   Induction:   PONV Risk Score and Plan: 2 and Ondansetron and Treatment may vary due to age or medical condition  Airway Management Planned: Natural Airway and Simple Face Mask  Additional Equipment: None  Intra-op Plan:   Post-operative Plan:   Informed Consent: I have reviewed the patients History and Physical, chart, labs and discussed the procedure including the risks, benefits and alternatives for the proposed anesthesia with the patient or authorized representative who has indicated his/her understanding and acceptance.     Dental advisory given  Plan Discussed with: CRNA and Surgeon  Anesthesia Plan Comments: (Pt voices understanding that this ablation is being done under MAC with light sedation,  it is expected that she may have recall)       Anesthesia Quick Evaluation

## 2023-05-04 NOTE — Transfer of Care (Signed)
Immediate Anesthesia Transfer of Care Note  Patient: Jocell Gerhold  Procedure(s) Performed: SVT ABLATION  Patient Location: PACU and Cath Lab  Anesthesia Type:MAC  Level of Consciousness: awake, alert , oriented, and patient cooperative  Airway & Oxygen Therapy: Patient Spontanous Breathing and Patient connected to face mask oxygen  Post-op Assessment: Report given to RN, Post -op Vital signs reviewed and stable, and Patient moving all extremities X 4  Post vital signs: Reviewed and stable  Last Vitals:  Vitals Value Taken Time  BP    Temp    Pulse 83 05/04/23 1508  Resp 14 05/04/23 1508  SpO2 99 % 05/04/23 1508  Vitals shown include unfiled device data.  Last Pain:  Vitals:   05/04/23 1138  TempSrc:   PainSc: 7          Complications: There were no known notable events for this encounter.

## 2023-05-04 NOTE — Discharge Instructions (Signed)
Cardiac Ablation, Care After  This sheet gives you information about how to care for yourself after your procedure. Your health care provider may also give you more specific instructions. If you have problems or questions, contact your health care provider. What can I expect after the procedure? After the procedure, it is common to have: Bruising around your puncture site. Tenderness around your puncture site. Skipped heartbeats. If you had an atrial fibrillation ablation, you may have atrial fibrillation during the first several months after your procedure.  Tiredness (fatigue).  Follow these instructions at home: Puncture site care  Follow instructions from your health care provider about how to take care of your puncture site. Make sure you:   If a large square bandage is present, this may be removed 24 hours after surgery.  Check your puncture site every day for signs of infection. Check for: Redness, swelling, or pain. Fluid or blood. If your puncture site starts to bleed, lie down on your back, apply firm pressure to the area, and contact your health care provider. Warmth. Pus or a bad smell. A pea or small marble sized lump at the site is normal and can take up to three months to resolve.  Driving Do not drive for at least 4 days after your procedure or however long your health care provider recommends. (Do not resume driving if you have previously been instructed not to drive for other health reasons.) Do not drive or use heavy machinery while taking prescription pain medicine. Activity Avoid activities that take a lot of effort for at least 7 days after your procedure. Do not lift anything that is heavier than 5 lb (4.5 kg) for one week.  No sexual activity for 1 week.  Return to your normal activities as told by your health care provider. Ask your health care provider what activities are safe for you. General instructions Take over-the-counter and prescription medicines only  as told by your health care provider. Do not use any products that contain nicotine or tobacco, such as cigarettes and e-cigarettes. If you need help quitting, ask your health care provider. You may shower after 24 hours, but Do not take baths, swim, or use a hot tub for 1 week.  Do not drink alcohol for 24 hours after your procedure. Keep all follow-up visits as told by your health care provider. This is important. Contact a health care provider if: You have redness, mild swelling, or pain around your puncture site. You have fluid or blood coming from your puncture site that stops after applying firm pressure to the area. Your puncture site feels warm to the touch. You have pus or a bad smell coming from your puncture site. You have a fever. You have chest pain or discomfort that spreads to your neck, jaw, or arm. You have chest pain that is worse with lying on your back or taking a deep breath. You are sweating a lot. You feel nauseous. You have a fast or irregular heartbeat. You have shortness of breath. You are dizzy or light-headed and feel the need to lie down. You have pain or numbness in the arm or leg closest to your puncture site. Get help right away if: Your puncture site suddenly swells. Your puncture site is bleeding and the bleeding does not stop after applying firm pressure to the area. These symptoms may represent a serious problem that is an emergency. Do not wait to see if the symptoms will go away. Get medical help right away.  Call your local emergency services (911 in the [place] Do not drive yourself to the hospital. Summary After the procedure, it is normal to have bruising and tenderness at the puncture site in your groin, neck, or forearm. Check your puncture site every day for signs of infection. Get help right away if your puncture site is bleeding and the bleeding does not stop after applying firm pressure to the area. This is a medical emergency. This information  is not intended to replace advice given to you by your health care provider. Make sure you discuss any questions you have with your health care provider.

## 2023-05-04 NOTE — Progress Notes (Signed)
 Pt ambulated without difficulty or bleeding.  Discharge instructions given to pt and daughter  who verbalize understanding and deny further questions.  Discharged home with  daughter who will drive and stay with pt x 24 hrs.

## 2023-05-05 ENCOUNTER — Ambulatory Visit (INDEPENDENT_AMBULATORY_CARE_PROVIDER_SITE_OTHER): Payer: Medicare (Managed Care) | Admitting: Behavioral Health

## 2023-05-05 ENCOUNTER — Encounter (HOSPITAL_COMMUNITY): Payer: Self-pay | Admitting: Cardiology

## 2023-05-05 DIAGNOSIS — F331 Major depressive disorder, recurrent, moderate: Secondary | ICD-10-CM | POA: Diagnosis not present

## 2023-05-05 DIAGNOSIS — F411 Generalized anxiety disorder: Secondary | ICD-10-CM

## 2023-05-05 NOTE — Progress Notes (Signed)
Paula Stout, [facility] [place]  [facility] Counselor/Therapist Progress Note  Patient ID: Paula Stout, MRN: [id number],    Date: 05/05/2023  Time Spent: 38 minutes, 2:00 PM until 2:38 PM spent with the patient.This session was held via video teletherapy. The patient consented to the video teletherapy and was located in her home during this session. She is aware it is the responsibility of the patient to secure confidentiality on her end of the session. The provider was in a private home  for the duration of this session.      Treatment Type: Individual Therapy  Reported Symptoms: Anxiety, depression  Mental Status Exam: Appearance:  Casual     Behavior: Appropriate  Motor: Normal  Speech/Language:  Normal Rate  Affect: Appropriate  Mood: normal  Thought process: normal  Thought content:   WNL  Sensory/Perceptual disturbances:   WNL  Orientation: oriented to person, place, time/date, situation, day of week, month of year, and year  Attention: Good  Concentration: Good  Memory: WNL  Fund of knowledge:  Good  Insight:   Good  Judgment:  Good  Impulse Control: Good   Risk Assessment: Danger to Self:  No Self-injurious Behavior: No Danger to Others: No Duty to Warn:no Physical Aggression / Violence:No  Access to Firearms a concern: No  Gang Involvement:No   Subjective: The patient is tired and sore .  Her ablation went well yesterday but she reported that the procedure took over 4 hours.  It was more involved and the surgeon anticipated being but he felt that it went well.  After completing the procedure they waited 20 minutes trying to see if she would have another episode and she did not.  She has not had any since before the surgery.  She is optimistic that this will make a big difference.  She has an apple watch where she checks her heart rate and if it goes above a certain write checks in with the doctor's office.  She is back on her  psych meds as well as heart meds.  The patient does report some financial stress but looked at her budget and recognize that take out or eating out have become more privacy so she is trying to buy more groceries to have at home so that she can cook more.  Right now she does not have the energy to do so very long but is encouraging her daughter to be a part of this.  We booked a financial equity between she and her daughter to relieve some of that stress.  She also acknowledges that in the past she has had a history of impulsive buying.  There was a point years ago where she would overdraft but has not done that in a long time.  She puts things in her cart on line shopping and most of the time does a good job of not buying things from those cards.  We talked about different ways to put checks and balances into that including making a list of things in her cart that are either essential or extra.  The patient reports that her anxiety was up some when she was off of her meds but that seems to be stabilizing some.  She has fairly strict restrictions including lifting over 5 pounds, going down stairs or coming back up she must stay up or down stairs for 2 hours and she is adhering to those.  For the most part her sleep has been fairly well since being home.  We talked about the importance of her exercises for reducing anxiety as well as challenging depressive thoughts.  She is aware of her depression and says she will contract for safety and do as much as she can to cope.  She does contract for safety and I will see her weekly. Interventions: Cognitive Behavioral Therapy  Diagnosis: Generalized anxiety disorder, major depressive disorder, recurrent, moderate.  Plan: I will meet with the patient weekly via care agility.  Treatment plan: We will use cognitive behavioral therapy as well as person centered and supportive therapy in addition to elements of dialectical behavior therapy to help reduce the patient's  anxiety and depression by at least 50% with a target date of October 06, 2023.  Goals for improving depression may include having less sadness as indicated by patient report and scores on the PHQ-9, have improved mood and return to a healthier level of functioning, identify causes including environment for depressed mood and learn ways to cope with depression especially those connected to her medical issues.  Interventions include using cognitive behavioral therapy to explore and replace thoughts and behaviors.  We will look at how depression is experienced in day-to-day living and encouraged sharing of feelings.  We will encourage the use of coping skills for management of depressive symptoms.  Goals for reducing anxiety are to improve her ability to manage anxiety symptoms, better handle stress, identify causes for anxiety and explore ways to lower it, resolve the core conflicts contributing to anxiety as well as manage thoughts and worrisome thinking contributing to feelings of anxiety.  Interventions will include providing education about anxiety, facilitate problem solution skills to help her identify options for resolving stress, teach coping skills for managing anxiety as well as mindfulness and communication in terms of family to reduce her stress level, use cognitive behavior therapy to identify and change anxiety provoking thought and behavior patterns as well as teach distress tolerance and mindfulness skills for alleviating anxiety.  Paula Stout, [facility]                  Paula Stout, [facility]               Paula Stout, [facility]               Paula Stout, [facility]               Paula Stout, [facility]

## 2023-05-13 ENCOUNTER — Ambulatory Visit: Payer: Medicare (Managed Care) | Admitting: Behavioral Health

## 2023-05-15 ENCOUNTER — Emergency Department (HOSPITAL_BASED_OUTPATIENT_CLINIC_OR_DEPARTMENT_OTHER)
Admission: EM | Admit: 2023-05-15 | Discharge: 2023-05-15 | Disposition: A | Discharge status: Home / Self Care | Payer: Medicare (Managed Care) | Attending: Emergency Medicine | Admitting: Emergency Medicine

## 2023-05-15 ENCOUNTER — Other Ambulatory Visit: Payer: Self-pay

## 2023-05-15 ENCOUNTER — Emergency Department (HOSPITAL_BASED_OUTPATIENT_CLINIC_OR_DEPARTMENT_OTHER): Payer: Medicare (Managed Care)

## 2023-05-15 ENCOUNTER — Encounter (HOSPITAL_BASED_OUTPATIENT_CLINIC_OR_DEPARTMENT_OTHER): Payer: Self-pay | Admitting: *Deleted

## 2023-05-15 DIAGNOSIS — N189 Chronic kidney disease, unspecified: Secondary | ICD-10-CM | POA: Insufficient documentation

## 2023-05-15 DIAGNOSIS — G43901 Migraine, unspecified, not intractable, with status migrainosus: Secondary | ICD-10-CM | POA: Insufficient documentation

## 2023-05-15 DIAGNOSIS — R0789 Other chest pain: Secondary | ICD-10-CM | POA: Diagnosis not present

## 2023-05-15 DIAGNOSIS — Z7901 Long term (current) use of anticoagulants: Secondary | ICD-10-CM | POA: Diagnosis not present

## 2023-05-15 DIAGNOSIS — I129 Hypertensive chronic kidney disease with stage 1 through stage 4 chronic kidney disease, or unspecified chronic kidney disease: Secondary | ICD-10-CM | POA: Insufficient documentation

## 2023-05-15 DIAGNOSIS — R079 Chest pain, unspecified: Secondary | ICD-10-CM | POA: Insufficient documentation

## 2023-05-15 DIAGNOSIS — I4891 Unspecified atrial fibrillation: Secondary | ICD-10-CM | POA: Diagnosis not present

## 2023-05-15 DIAGNOSIS — R519 Headache, unspecified: Secondary | ICD-10-CM | POA: Diagnosis present

## 2023-05-15 DIAGNOSIS — R0602 Shortness of breath: Secondary | ICD-10-CM | POA: Diagnosis not present

## 2023-05-15 DIAGNOSIS — G43909 Migraine, unspecified, not intractable, without status migrainosus: Secondary | ICD-10-CM | POA: Diagnosis not present

## 2023-05-15 LAB — BASIC METABOLIC PANEL
Anion gap: 10 (ref 5–15)
BUN: 25 mg/dL — ABNORMAL HIGH (ref 8–23)
CO2: 19 mmol/L — ABNORMAL LOW (ref 22–32)
Calcium: 9.5 mg/dL (ref 8.9–10.3)
Chloride: 110 mmol/L (ref 98–111)
Creatinine, Ser: 2.08 mg/dL — ABNORMAL HIGH (ref 0.44–1.00)
GFR, Estimated: 26 mL/min — ABNORMAL LOW (ref >60)
Glucose, Bld: 132 mg/dL — ABNORMAL HIGH (ref 70–99)
Potassium: 4.4 mmol/L (ref 3.5–5.1)
Sodium: 139 mmol/L (ref 135–145)

## 2023-05-15 LAB — CBC
HCT: 37.2 % (ref 36.0–46.0)
Hemoglobin: 11.9 g/dL — ABNORMAL LOW (ref 12.0–15.0)
MCH: 30.1 pg (ref 26.0–34.0)
MCHC: 32 g/dL (ref 30.0–36.0)
MCV: 94.2 fL (ref 80.0–100.0)
Platelets: 340 10*3/uL (ref 150–400)
RBC: 3.95 MIL/uL (ref 3.87–5.11)
RDW: 16.2 % — ABNORMAL HIGH (ref 11.5–15.5)
WBC: 7.7 10*3/uL (ref 4.0–10.5)
nRBC: 0 % (ref 0.0–0.2)

## 2023-05-15 LAB — TROPONIN I (HIGH SENSITIVITY)
Troponin I (High Sensitivity): 6 ng/L (ref <18)
Troponin I (High Sensitivity): 6 ng/L (ref <18)

## 2023-05-15 LAB — BRAIN NATRIURETIC PEPTIDE: B Natriuretic Peptide: 62.1 pg/mL (ref 0.0–100.0)

## 2023-05-15 MED ORDER — DIPHENHYDRAMINE HCL 50 MG/ML IJ SOLN
25.0000 mg | Freq: Once | INTRAMUSCULAR | Status: AC
  Administered 2023-05-15: 25 mg via INTRAVENOUS
  Filled 2023-05-15: qty 1

## 2023-05-15 MED ORDER — HYDROMORPHONE HCL 1 MG/ML IJ SOLN
0.5000 mg | Freq: Once | INTRAMUSCULAR | Status: AC
  Administered 2023-05-15: 0.5 mg via INTRAVENOUS
  Filled 2023-05-15: qty 1

## 2023-05-15 MED ORDER — DEXAMETHASONE SODIUM PHOSPHATE 4 MG/ML IJ SOLN
4.0000 mg | Freq: Once | INTRAMUSCULAR | Status: AC
  Administered 2023-05-15: 4 mg via INTRAVENOUS
  Filled 2023-05-15: qty 1

## 2023-05-15 MED ORDER — ACETAMINOPHEN 500 MG PO TABS
1000.0000 mg | ORAL_TABLET | Freq: Once | ORAL | Status: AC
  Administered 2023-05-15: 1000 mg via ORAL
  Filled 2023-05-15: qty 2

## 2023-05-15 MED ORDER — PROCHLORPERAZINE EDISYLATE 10 MG/2ML IJ SOLN
10.0000 mg | Freq: Once | INTRAMUSCULAR | Status: AC
  Administered 2023-05-15: 10 mg via INTRAVENOUS
  Filled 2023-05-15: qty 2

## 2023-05-15 MED ORDER — SODIUM CHLORIDE 0.9 % IV BOLUS
1000.0000 mL | Freq: Once | INTRAVENOUS | Status: AC
  Administered 2023-05-15: 1000 mL via INTRAVENOUS

## 2023-05-15 NOTE — ED Notes (Signed)
Called lab for BNP add-on!

## 2023-05-15 NOTE — ED Provider Notes (Signed)
Paula Stout Provider Note   CSN: [id number] Arrival date & time: 05/15/23  1923     History  Chief Complaint  Patient presents with   Chest Pain   Headache    Paula Stout is a 67 y.o. female.  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  Patient states that she has had a migraine headache for the last few days.  She states that this feels typical of her migraines and it is on the left side of her head with a sharp pain across her back.  She states that she has associated photophobia.  She denies any nausea, vomiting, vision changes, numbness or weakness.  She states that she has been taking Nurtec at home without much relief.  Patient also reports that she has been having intermittent chest pain since her ablation.  She states that the pain, randomly and is not associated with exertion or any specific positions.  She states that she has had intermittent episodes of her heart racing and today her heart rate went up to 115.  She states that she tried vagal maneuvers on lying down and normalized on its own.  She states that she did have increased chest pain during this episode.  She states that she has had associated shortness of breath, orthopnea and PND.  Denies any recent fever or cough.  The history is provided by the patient.  Chest Pain Associated symptoms: headache   Headache      Home Medications Prior to Admission medications   Medication Sig Start Date End Date Taking? Authorizing Provider  acetaminophen (TYLENOL) 500 MG tablet Take 1,000 mg by mouth every 6 (six) hours as needed for mild pain.    [provider]  albuterol (VENTOLIN HFA) 108 (90 Base) MCG/ACT inhaler Inhale 2 puffs into the lungs every 4 (four) hours as needed for  wheezing. 06/23/20   Sharlene Dory, DO  Ascorbic Acid (VITAMIN C PO) Take 1 tablet by mouth daily.    [provider]  atorvastatin (LIPITOR) 80 MG tablet TAKE 1 TABLET DAILY 02/09/23   Sharlene Dory, DO  blood glucose meter kit and supplies One Touch Ultra  Check blood sugars once daily Dx: E11.9 Patient taking differently: 1 each by Other route See admin instructions. One Touch Ultra  Check blood sugars once daily Dx: E11.9 10/01/21   Sharlene Dory, DO  botulinum toxin Type A (BOTOX) 200 units injection Inject 155 units IM into multiple site in the face,neck and head once every 90 days 06/15/22   Drema Dallas, DO  buPROPion (WELLBUTRIN XL) 300 MG 24 hr tablet TAKE 1 TABLET DAILY 10/29/22   Sharlene Dory, DO  Cholecalciferol (VITAMIN D3) 125 MCG (5000 UT) capsule Take 5,000 Units by mouth daily.    [provider]  Cyanocobalamin (VITAMIN B-12) 5000 MCG TBDP Take 5,000 mcg by mouth daily.    [provider]  dapagliflozin propanediol (FARXIGA) 10 MG TABS tablet Take 1 tablet (10 mg total) by mouth daily. 03/23/23   Sharlene Dory, DO  diltiazem (CARDIZEM) 30 MG tablet Take 1 tablet every 4 hours AS NEEDED for AFIB or SVT heart rate >95 as long as top BP >100. Patient not taking: Reported on 04/25/2023 12/03/22   Sherie Don, NP  diphenhydrAMINE (  BENADRYL) 25 MG tablet Take 25 mg by mouth daily as needed for allergies (seasonal).    [provider]  Dulaglutide (TRULICITY) 1.5 MG/0.5ML SOPN Inject 1.5 mg into the skin once a week. 03/18/23   Sharlene Dory, DO  ELIQUIS 5 MG TABS tablet TAKE 1 TABLET TWICE A DAY 02/02/23   Sharlene Dory, DO  EPINEPHrine 0.3 mg/0.3 mL IJ SOAJ injection Inject 0.3 mg into the muscle as needed for anaphylaxis. 11/17/20   Sharlene Dory, DO  ezetimibe (ZETIA) 10 MG tablet TAKE 1 TABLET DAILY 02/03/23   Sharlene Dory, DO  fenofibrate 160 MG tablet Take 1  tablet (160 mg total) by mouth daily. 11/23/22   Sharlene Dory, DO  fluticasone (FLONASE) 50 MCG/ACT nasal spray Place 2 sprays into both nostrils daily. Patient taking differently: Place 2 sprays into both nostrils 3 (three) times a week. 01/29/23   Junie Spencer, FNP  levocetirizine (XYZAL) 5 MG tablet TAKE 1 TABLET EVERY EVENING 01/14/23   Wendling, Jilda Roche, DO  Lurasidone HCl 120 MG TABS Take 1 tablet (120 mg total) by mouth at bedtime. 08/03/22   Sharlene Dory, DO  magnesium oxide (MAG-OX) 400 (240 Mg) MG tablet Take 400 mg by mouth daily.    [provider]  metFORMIN (GLUCOPHAGE-XR) 500 MG 24 hr tablet Take 1 tablet (500 mg total) by mouth 2 (two) times daily with a meal. 04/20/23   Wendling, Jilda Roche, DO  metoprolol succinate (TOPROL-XL) 100 MG 24 hr tablet Take 1 tablet (100 mg total) by mouth 2 (two) times daily. Take with or immediately following a meal. 04/12/23   Camnitz, Andree Coss, MD  metoprolol tartrate (LOPRESSOR) 25 MG tablet Take 1 tablet (25 mg total) by mouth every 4 (four) hours as needed (PALPITATIONS). 08/24/22   Camnitz, Andree Coss, MD  Multiple Vitamins-Minerals (MULTIVITAMIN WITH MINERALS) tablet Take 1 tablet by mouth daily.    [provider]  nitroGLYCERIN (NITROSTAT) 0.4 MG SL tablet Place 1 tablet (0.4 mg total) under the tongue every 5 (five) minutes as needed for chest pain. 03/16/23   Georgeanna Lea, MD  ondansetron (ZOFRAN-ODT) 4 MG disintegrating tablet Take 1 tablet (4 mg total) by mouth every 8 (eight) hours as needed for nausea or vomiting. 01/27/23   Wendling, Jilda Roche, DO  ONETOUCH VERIO test strip USE DAILY TO CHECK BLOOD SUGAR 10/29/22   Carmelia Roller, Jilda Roche, DO  pantoprazole (PROTONIX) 40 MG tablet Take 1 tablet (40 mg total) by mouth daily. 11/22/22   Sharlene Dory, DO  QUEtiapine (SEROQUEL) 300 MG tablet Take 1 tablet (300 mg total) by mouth at bedtime. 08/03/22   Sharlene Dory,  DO  Rimegepant Sulfate (NURTEC) 75 MG TBDP Take 1 tablet (75 mg total) by mouth as needed (take 1 tab at the earlist onset of a migraine. Max 1 tab in 24 hours). 04/04/23   Everlena Cooper, Adam R, DO  topiramate (TOPAMAX) 50 MG tablet TAKE 3 TABLETS TWICE A DAY Patient taking differently: Take 150 mg by mouth 2 (two) times daily. 01/14/23   Sharlene Dory, DO  Vilazodone HCl (VIIBRYD) 40 MG TABS Take 1 tablet (40 mg total) by mouth daily. 04/06/23   Joan Flores, NP      Allergies    Bee venom, Onion, Tetanus-diphtheria toxoids td, Sumatriptan, Zolpidem, Asa [aspirin], Iodine, Nsaids, Sulfa antibiotics, and Vancomycin    Review of Systems   Review of Systems  Cardiovascular:  Positive for chest  pain.  Neurological:  Positive for headaches.    Physical Exam Updated Vital Signs BP 122/76 (BP Location: Right Arm)   Pulse 61   Temp 98.1 F (36.7 C) (Oral)   Resp 18   Wt 96.6 kg   SpO2 100%   BMI 34.38 kg/m  Physical Exam Vitals and nursing note reviewed.  Constitutional:      General: She is not in acute distress.    Appearance: She is well-developed.  HENT:     Head: Normocephalic and atraumatic.  Eyes:     Extraocular Movements: Extraocular movements intact.  Cardiovascular:     Rate and Rhythm: Normal rate and regular rhythm.     Heart sounds: Normal heart sounds.  Pulmonary:     Effort: Pulmonary effort is normal.     Breath sounds: Normal breath sounds.  Abdominal:     Palpations: Abdomen is soft.     Tenderness: There is no abdominal tenderness.  Musculoskeletal:        General: Normal range of motion.     Cervical back: Normal range of motion and neck supple.     Right lower leg: No edema.     Left lower leg: No edema.  Skin:    General: Skin is warm and dry.  Neurological:     Mental Status: She is alert and oriented to person, place, and time.     GCS: GCS eye subscore is 4. GCS verbal subscore is 5. GCS motor subscore is 6.     Cranial Nerves: No cranial  nerve deficit, dysarthria or facial asymmetry.     Sensory: No sensory deficit.     Motor: No weakness.  Psychiatric:        Mood and Affect: Mood normal.        Speech: Speech normal.        Behavior: Behavior normal.     ED Results / Procedures / Treatments   Labs (all labs ordered are listed, but only abnormal results are displayed) Labs Reviewed  BASIC METABOLIC PANEL - Abnormal; Notable for the following components:      Result Value   CO2 19 (*)    Glucose, Bld 132 (*)    BUN 25 (*)    Creatinine, Ser 2.08 (*)    GFR, Estimated 26 (*)    All other components within normal limits  CBC - Abnormal; Notable for the following components:   Hemoglobin 11.9 (*)    RDW 16.2 (*)    All other components within normal limits  BRAIN NATRIURETIC PEPTIDE  TROPONIN I (HIGH SENSITIVITY)  TROPONIN I (HIGH SENSITIVITY)    EKG None  Radiology DG Chest 2 View  Result Date: 05/15/2023 CLINICAL DATA:  CP EXAM: CHEST - 2 VIEW COMPARISON:  Chest x-ray 07/08/2022 FINDINGS: The heart and mediastinal contours are within normal limits. No focal consolidation. No pulmonary edema. No pleural effusion. No pneumothorax. No acute osseous abnormality. IMPRESSION: No active cardiopulmonary disease. Electronically Signed   By: Tish Frederickson M.D.   On: 05/15/2023 20:42    Procedures Ultrasound ED Echo  Date/Time: 05/15/2023 11:20 PM  Performed by: Rexford Maus, DO Authorized by: Rexford Maus, DO   Procedure details:    Indications: chest pain     Views: subxiphoid, parasternal long axis view, parasternal short axis view and apical 4 chamber view     Images: archived   Findings:    Pericardium: small pericardial effusion     LV Function: normal (>  50% EF)     RV Diameter: normal   Impression:    Impression: pericardial effusion present       Medications Ordered in ED Medications  dexamethasone (DECADRON) injection 4 mg (4 mg Intravenous Given 05/15/23 2043)  acetaminophen  (TYLENOL) tablet 1,000 mg (1,000 mg Oral Given 05/15/23 2041)  prochlorperazine (COMPAZINE) injection 10 mg (10 mg Intravenous Given 05/15/23 2044)  diphenhydrAMINE (BENADRYL) injection 25 mg (25 mg Intravenous Given 05/15/23 2042)  sodium chloride 0.9 % bolus 1,000 mL ( Intravenous Stopped 05/15/23 2153)  HYDROmorphone (DILAUDID) injection 0.5 mg (0.5 mg Intravenous Given 05/15/23 2042)  HYDROmorphone (DILAUDID) injection 0.5 mg (0.5 mg Intravenous Given 05/15/23 2247)    ED Course/ Medical Decision Making/ A&P Clinical Course as of 05/15/23 2324  Sun May 15, 2023  2121 Troponin negative, CXR without acute disease. Patient will be reassessed.  [VK]  2320 Trace to small pericardial effusion on echo, unlikely to be causing symptoms with pain going on for the past week. Headache now significantly improved. Patient is stable for discharge home with neurology and cardiology follow up. 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 The complaint involves an extensive differential diagnosis and also carries with it a high risk of complications and morbidity.    The differential diagnosis includes ACS, arrhythmia, anemia, pericarditis, myocarditis, pleural effusion, pericardial effusion, pneumonia, pneumothorax, migraine headache, patient's headache was not sudden onset is similar to headaches in the past and without focal neurologic deficits making ICH or mass effect unlikely  Additional history obtained: Additional history obtained from N/A Records reviewed previous admission documents  ED Course and Reassessment: On patient's arrival she is hemodynamically stable in no acute  distress.  She was initially evaluated by triage and had EKG, labs and chest x-ray ordered.  Patient's EKG does show normal sinus rhythm without acute ischemic changes, has not yet crossed over into MUSE.  Patient's labs show creatinine at her baseline initial troponin negative.  She will additionally have BNP.  Chest x-ray read is pending at this time.  Hemoglobin is at her baseline.  The patient gave me a list of medications that normally works for her migraines which will be ordered for her, lights were turned off and she will be closely reassessed.  Independent labs interpretation:  The following labs were independently interpreted: at baseline/within normal range  Independent visualization of imaging: - I independently visualized the following imaging with scope of interpretation limited to determining acute life threatening conditions related to emergency care: CXR, which revealed no acute disease  Consultation: - Consulted or discussed management/test interpretation w/ external professional: N/A  Consideration for admission or further workup: Patient has no emergent conditions requiring admission or further work-up at this time and is stable for discharge home with primary care follow-up  Social Determinants of health: N/A    Amount and/or Complexity of Data Reviewed Labs: ordered. Radiology: ordered.  Risk OTC drugs. Prescription drug management.          Final Clinical Impression(s) / ED Diagnoses Final diagnoses:  Nonspecific  chest pain  Migraine with status migrainosus, not intractable, unspecified migraine type    Rx / DC Orders ED Discharge Orders     None         Rexford Maus, DO 05/15/23 2324

## 2023-05-15 NOTE — ED Notes (Signed)
When asked further about tachycardia pt states that she is on metoprolol and usually her HR is 50's and 60's and it has been 70's and 80's

## 2023-05-15 NOTE — ED Triage Notes (Signed)
Pt states that she is here for migraine HA which began yesterday and has been unrelieved by her prescription medications at home.  Pt has also been having central CP which feels like pressure.  Pt had an ablation for SVT done 04/30/23.  Pt states that she has been having intermittent tachycardia.

## 2023-05-15 NOTE — ED Notes (Signed)
Patient still in pain with headache after migraine cocktail.

## 2023-05-15 NOTE — Discharge Instructions (Signed)
Seen in the emergency department for your chest pain and your headache.  You remained in a normal heart rhythm here and had no signs of heart attack or stress on your heart.  You did have a very small amount of fluid around your heart on your ultrasound.  You were treated here for your migraine headache as well and we were able to improve your headache here.  You can follow-up with your cardiologist regarding your chest pain and your neurologist regarding your headaches.  You should return to the emergency department if you have significantly worsening chest pain, severe shortness of breath, you pass out, significantly worsening headaches, numbness or weakness on one side of the body compared to the other or any other new or concerning symptoms.

## 2023-05-15 NOTE — ED Notes (Signed)
Patient ambulatory to [facility]

## 2023-05-18 ENCOUNTER — Ambulatory Visit: Payer: Medicare (Managed Care) | Admitting: Sports Medicine

## 2023-05-18 ENCOUNTER — Telehealth (INDEPENDENT_AMBULATORY_CARE_PROVIDER_SITE_OTHER): Payer: Medicare (Managed Care) | Admitting: Behavioral Health

## 2023-05-18 ENCOUNTER — Encounter: Payer: Self-pay | Admitting: Behavioral Health

## 2023-05-18 ENCOUNTER — Encounter: Payer: Self-pay | Admitting: Sports Medicine

## 2023-05-18 VITALS — BP 114/70 | Ht 66.0 in | Wt 213.0 lb

## 2023-05-18 DIAGNOSIS — M25561 Pain in right knee: Secondary | ICD-10-CM | POA: Diagnosis not present

## 2023-05-18 DIAGNOSIS — F411 Generalized anxiety disorder: Secondary | ICD-10-CM

## 2023-05-18 DIAGNOSIS — F3130 Bipolar disorder, current episode depressed, mild or moderate severity, unspecified: Secondary | ICD-10-CM

## 2023-05-18 MED ORDER — METHYLPREDNISOLONE ACETATE 40 MG/ML IJ SUSP
40.0000 mg | Freq: Once | INTRAMUSCULAR | Status: AC
Start: 2023-05-18 — End: 2023-05-18
  Administered 2023-05-18: 40 mg via INTRA_ARTICULAR

## 2023-05-18 NOTE — Progress Notes (Signed)
Paula Stout [id number] 12/17/1955 67 y.o.  Virtual Visit via Video Note  I connected with pt @ on 05/18/23 at  1:30 PM EDT by a video enabled telemedicine application and verified that I am speaking with the correct person using two identifiers.   I discussed the limitations of evaluation and management by telemedicine and the availability of in person appointments. The patient expressed understanding and agreed to proceed.  I discussed the assessment and treatment plan with the patient. The patient was provided an opportunity to ask questions and all were answered. The patient agreed with the plan and demonstrated an understanding of the instructions.   The patient was advised to call back or seek an in-person evaluation if the symptoms worsen or if the condition fails to improve as anticipated.  I provided 30 minutes of non-face-to-face time during this encounter.  The patient was located at home.  The provider was located at [facility].   Joan Flores, NP   Subjective:   Patient ID:  Paula Stout is a 67 y.o. (DOB 06-18-1956) female.  Chief Complaint:  Chief Complaint  Patient presents with   Depression   Anxiety   Follow-up   Medication Refill   Patient Education    HPI "[name]", 67 year old female presents to this office via video visit for follow up and medication management.  Collateral information should be considered reliable.  Says that she needs a cardiac ablation for SVT's but procedure was postponed due to lack of anesthetist personnel. She is schedule for Aug 28th. Right now says she is uncomfortable with the fluttering which increases her anxiety.    Says that she is experiencing more depression recently and is not happy in her current living situation.  She reports her depression today at 6/10, and anxiety at 5/10.  She is sleeping 7 to 8 hours per night with the aid of Seroquel.  She would like to consider a medication adjustment this visit.    She  denies any current mania, no history of psychosis or auditory or visual hallucinations.  No SI or HI.  Patient states that she feels safe and verbally contracts for safety today.   Past psychiatric medication trials: Zoloft Prozac Cymbalta Lithium Depakote Valium Ativan Gabapentin Trazodone Ambien   Review of Systems:  Review of Systems  Constitutional: Negative.   Allergic/Immunologic: Negative.   Neurological: Negative.   Psychiatric/Behavioral:  Positive for dysphoric mood.     Medications: I have reviewed the patient's current medications.  Current Outpatient Medications  Medication Sig Dispense Refill   acetaminophen (TYLENOL) 500 MG tablet Take 1,000 mg by mouth every 6 (six) hours as needed for mild pain.     albuterol (VENTOLIN HFA) 108 (90 Base) MCG/ACT inhaler Inhale 2 puffs into the lungs every 4 (four) hours as needed for wheezing. 1 each 2   Ascorbic Acid (VITAMIN C PO) Take 1 tablet by mouth daily.     atorvastatin (LIPITOR) 80 MG tablet TAKE 1 TABLET DAILY 90 tablet 3   blood glucose meter kit and supplies One Touch Ultra  Check 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 One Touch Ultra  Check blood sugars once daily Dx: E11.9) 1 each 0   botulinum toxin Type A (BOTOX) 200 units injection Inject 155 units IM into multiple site in the face,neck and head once every 90 days 1 each 4   buPROPion (WELLBUTRIN XL) 300 MG 24 hr tablet TAKE 1  TABLET DAILY 90 tablet 3   Cholecalciferol (VITAMIN D3) 125 MCG (5000 UT) capsule Take 5,000 Units by mouth daily.     Cyanocobalamin (VITAMIN B-12) 5000 MCG TBDP Take 5,000 mcg by mouth daily.     dapagliflozin propanediol (FARXIGA) 10 MG TABS tablet Take 1 tablet (10 mg total) by mouth daily. 90 tablet 1   diltiazem (CARDIZEM) 30 MG tablet Take 1 tablet every 4 hours AS NEEDED for AFIB or SVT heart rate >95 as long as top BP >100. (Patient not taking: Reported on  04/25/2023) 30 tablet 1   diphenhydrAMINE (BENADRYL) 25 MG tablet Take 25 mg by mouth daily as needed for allergies (seasonal).     Dulaglutide (TRULICITY) 1.5 MG/0.5ML SOPN Inject 1.5 mg into the skin once a week. 6 mL 2   ELIQUIS 5 MG TABS tablet TAKE 1 TABLET TWICE A DAY 180 tablet 3   EPINEPHrine 0.3 mg/0.3 mL IJ SOAJ injection Inject 0.3 mg into the muscle as needed for anaphylaxis. 1 each 1   ezetimibe (ZETIA) 10 MG tablet TAKE 1 TABLET DAILY 90 tablet 3   fenofibrate 160 MG tablet Take 1 tablet (160 mg total) by mouth daily. 90 tablet 3   fluticasone (FLONASE) 50 MCG/ACT nasal spray Place 2 sprays into both nostrils daily. (Patient taking differently: Place 2 sprays into both nostrils 3 (three) times a week.) 16 g 6   levocetirizine (XYZAL) 5 MG tablet TAKE 1 TABLET EVERY EVENING 90 tablet 3   Lurasidone HCl 120 MG TABS Take 1 tablet (120 mg total) by mouth at bedtime. 90 tablet 3   magnesium oxide (MAG-OX) 400 (240 Mg) MG tablet Take 400 mg by mouth daily.     metFORMIN (GLUCOPHAGE-XR) 500 MG 24 hr tablet Take 1 tablet (500 mg total) by mouth 2 (two) times daily with a meal. 180 tablet 0   metoprolol succinate (TOPROL-XL) 100 MG 24 hr tablet Take 1 tablet (100 mg total) by mouth 2 (two) times daily. Take with or immediately following a meal. 180 tablet 3   metoprolol tartrate (LOPRESSOR) 25 MG tablet Take 1 tablet (25 mg total) by mouth every 4 (four) hours as needed (PALPITATIONS). 90 tablet 3   Multiple Vitamins-Minerals (MULTIVITAMIN WITH MINERALS) tablet Take 1 tablet by mouth daily.     nitroGLYCERIN (NITROSTAT) 0.4 MG SL tablet Place 1 tablet (0.4 mg total) under the tongue every 5 (five) minutes as needed for chest pain. 25 tablet 11   ondansetron (ZOFRAN-ODT) 4 MG disintegrating tablet Take 1 tablet (4 mg total) by mouth every 8 (eight) hours as needed for nausea or vomiting. 20 tablet 0   ONETOUCH VERIO test strip USE DAILY TO CHECK BLOOD SUGAR 100 strip 3   pantoprazole (PROTONIX)  40 MG tablet Take 1 tablet (40 mg total) by mouth daily. 90 tablet 3   QUEtiapine (SEROQUEL) 300 MG tablet Take 1 tablet (300 mg total) by mouth at bedtime. 90 tablet 3   Rimegepant Sulfate (NURTEC) 75 MG TBDP Take 1 tablet (75 mg total) by mouth as needed (take 1 tab at the earlist onset of a migraine. Max 1 tab in 24 hours). 10 tablet 5   topiramate (TOPAMAX) 50 MG tablet TAKE 3 TABLETS TWICE A DAY (Patient taking differently: Take 150 mg by mouth 2 (two) times daily.) 180 tablet 11   Vilazodone HCl (VIIBRYD) 40 MG TABS Take 1 tablet (40 mg total) by mouth daily. 30 tablet 3   No current facility-administered medications for this visit.  Medication Side Effects: None  Allergies:  Allergies  Allergen Reactions   Bee Venom Anaphylaxis   Onion Other (See Comments)    Respiratory Distress   Tetanus-Diphtheria Toxoids Td Anaphylaxis   Sumatriptan     Passed out, nose bleed    Zolpidem Other (See Comments)    Causes sleep walking   Asa [Aspirin] Nausea And Vomiting   Iodine Rash   Nsaids Nausea Only    Rash, hives and trouble breathing   Sulfa Antibiotics Rash   Vancomycin Rash    Other Reaction(s): STEVENS-JOHNSON    Past Medical History:  Diagnosis Date   A-fib (HCC)    ACS (acute coronary syndrome) (HCC) 07/31/2021   Acute cystitis without hematuria 05/14/2017   Last Assessment & Plan:  Formatting of this note might be different from the original. - suprapubic tenderness with symptoms of urinary frequency and urgency and subjective fevers (no fevers recorded on any visit). Recently passed a renal stone, US shows left hydronephrosis which is improving.  - UA negative8/24 but pt is clinically symptomatic - will repeat UA  - start pt on Microbid for 5 days   Adhesive capsulitis of left shoulder 12/16/2016   Anxiety    Asthma without status asthmaticus 02/06/2021   At risk for falls 02/04/2019   Atrial fibrillation (HCC) 09/01/2014   Last Assessment & Plan:  Formatting of this  note might be different from the original. A:  Chronic.  Sinus rhythm at this time.  States she was told this during admission at [facility] in the past. P:  On baby aspirin at home will continue.  Low dose metoprolol started.   Bipolar 1 disorder, depressed, moderate (HCC) 02/18/2020   Capsulitis of left shoulder 10/20/2021   Chronic anticoagulation 02/20/2020   Chronic atrial fibrillation (HCC) 02/18/2020   Chronic headache 10/11/2013   Chronic interstitial cystitis 12/28/2005   Chronic migraine without aura, intractable, without status migrainosus 06/24/2017   Colon cancer screening 04/15/2016   Last Assessment & Plan:  Formatting of this note might be different from the original. Will schedule for colonoscopy   Depression, recurrent (HCC) 02/18/2020   Diabetes mellitus (HCC)    Dyslipidemia 02/20/2020   Dysmetabolic syndrome X 03/20/2008   GAD (generalized anxiety disorder) 02/18/2020   GERD (gastroesophageal reflux disease) 11/05/2021   Heartburn 04/15/2016   Last Assessment & Plan:  Formatting of this note might be different from the original. Patient was counseled regarding lifestyle modification  Advised to take Prilosec 30 mins before breakfast  Will schedule for EGD   History of atrial fibrillation 06/24/2017   History of posttraumatic stress disorder (PTSD) 10/07/2016   HLD (hyperlipidemia) 09/01/2014   Last Assessment & Plan:  Formatting of this note might be different from the original. A: Chronic. P: Continue statin.   Hyperosmolarity due to secondary diabetes mellitus (HCC) 02/06/2021   Hypertriglyceridemia 02/18/2020   Impaired mobility and activities of daily living 11/14/2015   Irritable bowel syndrome (IBS)    Junctional tachycardia 11/05/2021   Kidney stone 02/06/2021   Migraine without aura, not refractory 02/06/2021   Mixed hyperlipidemia 04/06/2021   Last Assessment & Plan:  Formatting of this note might be different from the original. Due for labs with PCP in  November   Morbid obesity (HCC) 12/16/2016   Paroxysmal supraventricular tachycardia 11/05/2021   Pneumonia, organism unspecified(486) 11/21/2002   Polyneuropathy due to type 2 diabetes mellitus (HCC) 02/06/2021   Primary hypertension 04/06/2021   Last Assessment & Plan:  Formatting  of this note might be different from the original. Controlled   Refractory migraine with aura 02/06/2021   Refractory migraine without aura 02/06/2021   Renal colic on left side 04/22/2017   Stage 3 chronic kidney disease (HCC) 02/20/2020   Unspecified adverse effect of other drug, medicinal and biological substance(995.29) 02/17/2006   Urinary tract obstruction 02/06/2021    Family History  Adopted: Yes  Problem Relation Age of Onset   Migraines Daughter    Bipolar disorder Daughter    Migraines Daughter    Migraines Son    Bipolar disorder Son     Social History   Socioeconomic History   Marital status: Widowed    Spouse name: Not on file   Number of children: 6   Years of education: Not on file   Highest education level: Associate degree: academic program  Occupational History   Occupation: not employed  Tobacco Use   Smoking status: Never   Smokeless tobacco: Never   Tobacco comments:    Never smoke 07/15/22  Vaping Use   Vaping status: Never Used  Substance and Sexual Activity   Alcohol use: Never    Comment: light social   Drug use: Never   Sexual activity: Not Currently  Other Topics Concern   Not on file  Social History Narrative   Lives in [place] with daughter and son-in-law.  Sews clothing in free time as hobby.    Social Determinants of Health   Financial Resource Strain: Medium Risk (02/16/2023)   Overall Financial Resource Strain (CARDIA)    Difficulty of Paying Living Expenses: Somewhat hard  Food Insecurity: No Food Insecurity (02/16/2023)   Hunger Vital Sign    Worried About Running Out of Food in the Last Year: Never true    Ran Out of Food in the Last Year:  Never true  Transportation Needs: No Transportation Needs (02/16/2023)   PRAPARE - Administrator, Civil Service (Medical): No    Lack of Transportation (Non-Medical): No  Physical Activity: Unknown (02/16/2023)   Exercise Vital Sign    Days of Exercise per Week: 2 days    Minutes of Exercise per Session: Patient declined  Stress: Stress Concern Present (02/16/2023)   [employer] of Occupational Health - Occupational Stress Questionnaire    Feeling of Stress : To some extent  Social Connections: Socially Isolated (02/16/2023)   Social Connection and Isolation Panel [NHANES]    Frequency of Communication with Friends and Family: More than three times a week    Frequency of Social Gatherings with Friends and Family: Never    Attends Religious Services: Never    Database administrator or Organizations: No    Attends Engineer, structural: Not on file    Marital Status: Widowed  Intimate Partner Violence: Not At Risk (10/27/2022)   Humiliation, Afraid, Rape, and Kick questionnaire    Fear of Current or Ex-Partner: No    Emotionally Abused: No    Physically Abused: No    Sexually Abused: No    Past Medical History, Surgical history, Social history, and Family history were reviewed and updated as appropriate.   Please see review of systems for further details on the patient's review from today.   Objective:   Physical Exam:  There were no vitals taken for this visit.  Physical Exam Neurological:     Mental Status: She is alert and oriented to person, place, and time.  Psychiatric:  Attention and Perception: Attention and perception normal.        Mood and Affect: Mood normal.        Speech: Speech normal.        Behavior: Behavior normal. Behavior is cooperative.        Cognition and Memory: Cognition and memory normal.        Judgment: Judgment normal.     Comments: Insight intact     Lab Review:     Component Value Date/Time   NA 139  05/15/2023 1952   NA 141 04/20/2023 1541   K 4.4 05/15/2023 1952   CL 110 05/15/2023 1952   CO2 19 (L) 05/15/2023 1952   GLUCOSE 132 (H) 05/15/2023 1952   BUN 25 (H) 05/15/2023 1952   BUN 14 04/20/2023 1541   CREATININE 2.08 (H) 05/15/2023 1952   CALCIUM 9.5 05/15/2023 1952   PROT 6.7 11/22/2022 1450   PROT 6.7 02/10/2021 0914   ALBUMIN 3.8 11/22/2022 1450   ALBUMIN 3.9 02/10/2021 0914   AST 32 11/22/2022 1450   ALT 24 11/22/2022 1450   ALKPHOS 63 11/22/2022 1450   BILITOT 0.5 11/22/2022 1450   BILITOT 0.3 02/10/2021 0914   GFRNONAA 26 (L) 05/15/2023 1952   GFRAA 39 (L) 04/22/2020 1726       Component Value Date/Time   WBC 7.7 05/15/2023 1952   RBC 3.95 05/15/2023 1952   HGB 11.9 (L) 05/15/2023 1952   HGB 12.4 04/20/2023 1537   HCT 37.2 05/15/2023 1952   HCT 38.3 04/20/2023 1537   PLT 340 05/15/2023 1952   PLT 324 04/20/2023 1537   MCV 94.2 05/15/2023 1952   MCV 92 04/20/2023 1537   MCH 30.1 05/15/2023 1952   MCHC 32.0 05/15/2023 1952   RDW 16.2 (H) 05/15/2023 1952   RDW 13.7 04/20/2023 1537   LYMPHSABS 1.9 10/13/2022 1002   MONOABS 0.6 12/15/2021 1949   EOSABS 0.3 10/13/2022 1002   BASOSABS 0.1 10/13/2022 1002    No results found for: "POCLITH", "LITHIUM"   No results found for: "PHENYTOIN", "PHENOBARB", "VALPROATE", "CBMZ"   .res Assessment: Plan:    Greater than 50% of 30 min video visit time with patient was spent on counseling and coordination of care. We discussed her recent health concerns as well as her medications. Says that she does not notice a difference right now with Viibryd.  Patient has history of SVTs.  She had   cardiac ablation on August 28.  Still having problems reports intermittent chest pain with activity. She is currently taking higher doses of metoprolol to mediate cardiac condition.  Also has history of A-Fib.  Advised patient to f/u and  clear any new medication with cardiologist.   We agreed today to: Will continue Seroquel 300 mg at  bedtime daily Will continue Latuda 120 mg daily To continue Wellbutrin 300 mg daily To increase Viibryd to 40 mg daily. Must take with food. Will report worsening symptoms promptly Provided emergency contact information Discussed potential metabolic side effects associated with atypical antipsychotics, as well as potential risk for movement side effects. Advised pt to contact office if movement side effects occur.   Reviewed PDMP          There are no diagnoses linked to this encounter.   Please see After Visit Summary for patient specific instructions.  Future Appointments  Date Time Provider Department Center  05/19/2023  2:00 PM [name], [facility] None  05/20/2023 10:50 AM Drema Dallas, DO [facility]  None  05/24/2023  2:20 PM Georgeanna Lea, MD [facility] None  05/26/2023  2:00 PM [name], [facility] None  05/31/2023 10:45 AM Sharlene Dory, DO [facility]  06/02/2023  2:00 PM [name], [facility] None  06/14/2023 10:00 AM Sharlene Dory, DO [facility]  06/20/2023  2:30 PM Regan Lemming, MD [facility] None  08/08/2023 10:00 AM [facility] PHARMACIST [facility]  08/19/2023 10:50 AM Jaffe, Adam R, DO [facility] None    No orders of the defined types were placed in this encounter.     -------------------------------

## 2023-05-18 NOTE — Progress Notes (Signed)
   Subjective:    Patient ID: Paula Stout, female    DOB: July 10, 1956, 67 y.o.   MRN: [id number]  HPI chief complaint: Right knee pain  Patient is a very pleasant 67 year old female that presents today for right knee pain.  She has had pain similar to this in the past.  She has had cortisone injections which have been helpful.  Last injection was 4 to 5 years ago.  Pain is identical to what she experienced at that time.  She describes a sharp medial pain in the knee that is intermittent.  It is most noticeable with navigating stairs.  She denies swelling.  No prior surgeries on the right knee but she is status post left knee arthroscopy in 2022 for a meniscus tear.  This was done in [place].  She uses ice and heat occasionally.  She is allergic to NSAIDs so she is limited to over-the-counter Tylenol.  Past medical history reviewed Medications reviewed Allergies reviewed    Review of Systems As above    Objective:   Physical Exam  Well-developed, well-nourished.  No acute distress  Right knee: Good range of motion.  No effusion.  She is tender to palpation along the medial joint line with a positive Thessaly's.  No tenderness laterally.  Knee is stable to valgus and varus stressing.  Negative anterior drawer, negative posterior drawer.  Neurovascularly intact distally.      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  Right knee is reinjected today utilizing anterior lateral approach.  She tolerates this without difficulty.  If symptoms persist, consider imaging including x-ray and MRI.  Follow-up for ongoing or recalcitrant issues.  Consent obtained and verified. Time-out conducted. Noted no overlying erythema, induration, or other signs of local infection. Skin prepped in a sterile fashion. Topical analgesic spray: Ethyl chloride. Joint: Right knee Needle:  25-gauge 1.5 inch Completed without difficulty. Meds: 3 cc 1% Xylocaine, 1 cc (40 mg) Depo-Medrol  This note was dictated using Dragon naturally speaking software and may contain errors in syntax, spelling, or content which have not been identified prior to signing this note.

## 2023-05-19 ENCOUNTER — Other Ambulatory Visit: Payer: Self-pay

## 2023-05-19 ENCOUNTER — Ambulatory Visit (INDEPENDENT_AMBULATORY_CARE_PROVIDER_SITE_OTHER): Payer: Medicare (Managed Care) | Admitting: Behavioral Health

## 2023-05-19 ENCOUNTER — Emergency Department (HOSPITAL_BASED_OUTPATIENT_CLINIC_OR_DEPARTMENT_OTHER)
Admission: EM | Admit: 2023-05-19 | Discharge: 2023-05-20 | Disposition: A | Discharge status: Home / Self Care | Payer: Medicare (Managed Care) | Attending: Emergency Medicine | Admitting: Emergency Medicine

## 2023-05-19 ENCOUNTER — Emergency Department (HOSPITAL_BASED_OUTPATIENT_CLINIC_OR_DEPARTMENT_OTHER): Payer: Medicare (Managed Care)

## 2023-05-19 ENCOUNTER — Encounter: Payer: Self-pay | Admitting: Behavioral Health

## 2023-05-19 DIAGNOSIS — R110 Nausea: Secondary | ICD-10-CM | POA: Insufficient documentation

## 2023-05-19 DIAGNOSIS — I728 Aneurysm of other specified arteries: Secondary | ICD-10-CM | POA: Diagnosis not present

## 2023-05-19 DIAGNOSIS — R519 Headache, unspecified: Secondary | ICD-10-CM | POA: Insufficient documentation

## 2023-05-19 DIAGNOSIS — Z794 Long term (current) use of insulin: Secondary | ICD-10-CM | POA: Insufficient documentation

## 2023-05-19 DIAGNOSIS — Z7901 Long term (current) use of anticoagulants: Secondary | ICD-10-CM | POA: Diagnosis not present

## 2023-05-19 DIAGNOSIS — E119 Type 2 diabetes mellitus without complications: Secondary | ICD-10-CM | POA: Diagnosis not present

## 2023-05-19 DIAGNOSIS — F411 Generalized anxiety disorder: Secondary | ICD-10-CM | POA: Diagnosis not present

## 2023-05-19 DIAGNOSIS — G9389 Other specified disorders of brain: Secondary | ICD-10-CM | POA: Diagnosis not present

## 2023-05-19 DIAGNOSIS — I4891 Unspecified atrial fibrillation: Secondary | ICD-10-CM | POA: Diagnosis not present

## 2023-05-19 DIAGNOSIS — H53149 Visual discomfort, unspecified: Secondary | ICD-10-CM | POA: Diagnosis not present

## 2023-05-19 DIAGNOSIS — R0602 Shortness of breath: Secondary | ICD-10-CM | POA: Insufficient documentation

## 2023-05-19 DIAGNOSIS — F331 Major depressive disorder, recurrent, moderate: Secondary | ICD-10-CM

## 2023-05-19 DIAGNOSIS — N189 Chronic kidney disease, unspecified: Secondary | ICD-10-CM | POA: Diagnosis not present

## 2023-05-19 DIAGNOSIS — Z7984 Long term (current) use of oral hypoglycemic drugs: Secondary | ICD-10-CM | POA: Insufficient documentation

## 2023-05-19 DIAGNOSIS — R0789 Other chest pain: Secondary | ICD-10-CM | POA: Insufficient documentation

## 2023-05-19 DIAGNOSIS — I6782 Cerebral ischemia: Secondary | ICD-10-CM | POA: Diagnosis not present

## 2023-05-19 DIAGNOSIS — R911 Solitary pulmonary nodule: Secondary | ICD-10-CM | POA: Diagnosis not present

## 2023-05-19 DIAGNOSIS — R079 Chest pain, unspecified: Secondary | ICD-10-CM | POA: Diagnosis not present

## 2023-05-19 LAB — TROPONIN I (HIGH SENSITIVITY)
Troponin I (High Sensitivity): 5 ng/L (ref <18)
Troponin I (High Sensitivity): 4 ng/L (ref <18)

## 2023-05-19 LAB — CBC WITH DIFFERENTIAL/PLATELET
Abs Immature Granulocytes: 0.02 10*3/uL (ref 0.00–0.07)
Basophils Absolute: 0.1 10*3/uL (ref 0.0–0.1)
Basophils Relative: 1 %
Eosinophils Absolute: 0.3 10*3/uL (ref 0.0–0.5)
Eosinophils Relative: 4 %
HCT: 36.8 % (ref 36.0–46.0)
Hemoglobin: 12 g/dL (ref 12.0–15.0)
Immature Granulocytes: 0 %
Lymphocytes Relative: 33 %
Lymphs Abs: 2.7 10*3/uL (ref 0.7–4.0)
MCH: 30.6 pg (ref 26.0–34.0)
MCHC: 32.6 g/dL (ref 30.0–36.0)
MCV: 93.9 fL (ref 80.0–100.0)
Monocytes Absolute: 0.6 10*3/uL (ref 0.1–1.0)
Monocytes Relative: 7 %
Neutro Abs: 4.5 10*3/uL (ref 1.7–7.7)
Neutrophils Relative %: 55 %
Platelets: 324 10*3/uL (ref 150–400)
RBC: 3.92 MIL/uL (ref 3.87–5.11)
RDW: 16.4 % — ABNORMAL HIGH (ref 11.5–15.5)
WBC: 8.2 10*3/uL (ref 4.0–10.5)
nRBC: 0 % (ref 0.0–0.2)

## 2023-05-19 LAB — BASIC METABOLIC PANEL
Anion gap: 9 (ref 5–15)
BUN: 18 mg/dL (ref 8–23)
CO2: 20 mmol/L — ABNORMAL LOW (ref 22–32)
Calcium: 9.4 mg/dL (ref 8.9–10.3)
Chloride: 110 mmol/L (ref 98–111)
Creatinine, Ser: 1.9 mg/dL — ABNORMAL HIGH (ref 0.44–1.00)
GFR, Estimated: 29 mL/min — ABNORMAL LOW (ref >60)
Glucose, Bld: 140 mg/dL — ABNORMAL HIGH (ref 70–99)
Potassium: 4.1 mmol/L (ref 3.5–5.1)
Sodium: 139 mmol/L (ref 135–145)

## 2023-05-19 MED ORDER — METOCLOPRAMIDE HCL 5 MG/ML IJ SOLN
10.0000 mg | Freq: Once | INTRAMUSCULAR | Status: AC
  Administered 2023-05-20: 10 mg via INTRAVENOUS
  Filled 2023-05-19: qty 2

## 2023-05-19 MED ORDER — DEXAMETHASONE SODIUM PHOSPHATE 10 MG/ML IJ SOLN
10.0000 mg | Freq: Once | INTRAMUSCULAR | Status: AC
  Administered 2023-05-20: 10 mg via INTRAVENOUS
  Filled 2023-05-19: qty 1

## 2023-05-19 MED ORDER — PROMETHAZINE HCL 25 MG/ML IJ SOLN
12.5000 mg | Freq: Once | INTRAMUSCULAR | Status: AC
  Administered 2023-05-20: 12.5 mg via INTRAMUSCULAR
  Filled 2023-05-19: qty 1

## 2023-05-19 MED ORDER — DIPHENHYDRAMINE HCL 50 MG/ML IJ SOLN
25.0000 mg | Freq: Once | INTRAMUSCULAR | Status: AC
  Administered 2023-05-20: 25 mg via INTRAVENOUS
  Filled 2023-05-19: qty 1

## 2023-05-19 NOTE — ED Triage Notes (Signed)
Pt c/o intermittent CP x 2 wks in center chest, describes as pressure; also c/o "migraine" since last night

## 2023-05-19 NOTE — Progress Notes (Signed)
[facility] Counselor/Therapist Progress Note  Patient ID: Paula Stout, MRN: [id number],    Date: 05/19/2023  Time Spent: 56 minutes, 2:00 PM until 2:56 PM spent with the patient.This session was held via video teletherapy. The patient consented to the video teletherapy and was located in her home during this session. She is aware it is the responsibility of the patient to secure confidentiality on her end of the session. The provider was in a private home  for the duration of this session.      Treatment Type: Individual Therapy  Reported Symptoms: Anxiety, depression  Mental Status Exam: Appearance:  Casual     Behavior: Appropriate  Motor: Normal  Speech/Language:  Normal Rate  Affect: Appropriate  Mood: normal  Thought process: normal  Thought content:   WNL  Sensory/Perceptual disturbances:   WNL  Orientation: oriented to person, place, time/date, situation, day of week, month of year, and year  Attention: Good  Concentration: Good  Memory: WNL  Fund of knowledge:  Good  Insight:   Good  Judgment:  Good  Impulse Control: Good   Risk Assessment: Danger to Self:  No Self-injurious Behavior: No Danger to Others: No Duty to Warn:no Physical Aggression / Violence:No  Access to Firearms a concern: No  Gang Involvement:No   Subjective: The patient continues to have significant chest pain since her ablation almost daily.  She went to the emergency room with chest pain and a migraine headache recently.  They did help treat the migraine and she gets a Botox injection tomorrow which she says last for 2 to 3 months typically.  Blood work was okay but 1 scan showed some abnormalities but encouraged her to discuss it with her cardiac team.  She has reached out to them but they basically per patient report told her to wait until her appointment next week.  It feels worse when she stands up but even when she is lying down or sitting down she still  feels chest pain.  She feels they will least make her wear a heart monitor but if this procedure was not effective she thinks the next that will probably be a pacemaker.  She acknowledges depression but no a lot of that is related to thinking she would feel much better and feeling no better and at times even worse.  She is limited in what she can do.  Her daughter has been complaining about not feeling well and asked the patient why she cannot help or not understanding what how badly the patient feels.  She knows she cannot eat as well because she cannot be up long enough to cook health meals.  It takes about all of her energy to get her dog downstairs and outside several times during the day.  She asked her daughter to take her laundry downstairs and her daughter has not done that yet either.  She is coping by spending time with her dog.  There has been more work from her son so that is a good distraction for her and she can lay in bed and do that on her laptop.  She is practicing relaxation breathing and grounding exercises as a distraction.  She does contract for safety and I will meet with her weekly.  Interventions: Cognitive Behavioral Therapy  Diagnosis: Generalized anxiety disorder, major depressive disorder, recurrent, moderate.  Plan: I will meet with the patient weekly via care agility.  Treatment plan: We will use cognitive  behavioral therapy as well as person centered and supportive therapy in addition to elements of dialectical behavior therapy to help reduce the patient's anxiety and depression by at least 50% with a target date of October 06, 2023.  Goals for improving depression may include having less sadness as indicated by patient report and scores on the PHQ-9, have improved mood and return to a healthier level of functioning, identify causes including environment for depressed mood and learn ways to cope with depression especially those connected to her medical issues.  Interventions  include using cognitive behavioral therapy to explore and replace thoughts and behaviors.  We will look at how depression is experienced in day-to-day living and encouraged sharing of feelings.  We will encourage the use of coping skills for management of depressive symptoms.  Goals for reducing anxiety are to improve her ability to manage anxiety symptoms, better handle stress, identify causes for anxiety and explore ways to lower it, resolve the core conflicts contributing to anxiety as well as manage thoughts and worrisome thinking contributing to feelings of anxiety.  Interventions will include providing education about anxiety, facilitate problem solution skills to help her identify options for resolving stress, teach coping skills for managing anxiety as well as mindfulness and communication in terms of family to reduce her stress level, use cognitive behavior therapy to identify and change anxiety provoking thought and behavior patterns as well as teach distress tolerance and mindfulness skills for alleviating anxiety. Progress: 30%  Paula Stout, [facility]                  Paula Stout, [facility]               Paula Stout, [facility]               Paula Stout, [facility]               Paula Stout, [facility]

## 2023-05-20 ENCOUNTER — Telehealth: Payer: Self-pay | Admitting: Neurology

## 2023-05-20 ENCOUNTER — Encounter: Payer: Self-pay | Admitting: Family Medicine

## 2023-05-20 ENCOUNTER — Ambulatory Visit (INDEPENDENT_AMBULATORY_CARE_PROVIDER_SITE_OTHER): Payer: Medicare (Managed Care) | Admitting: Family Medicine

## 2023-05-20 ENCOUNTER — Emergency Department (HOSPITAL_BASED_OUTPATIENT_CLINIC_OR_DEPARTMENT_OTHER): Payer: Medicare (Managed Care)

## 2023-05-20 ENCOUNTER — Ambulatory Visit: Payer: Medicare (Managed Care) | Admitting: Neurology

## 2023-05-20 VITALS — BP 122/80 | HR 69 | Temp 98.0°F | Ht 66.0 in | Wt 216.2 lb

## 2023-05-20 DIAGNOSIS — R079 Chest pain, unspecified: Secondary | ICD-10-CM | POA: Diagnosis not present

## 2023-05-20 DIAGNOSIS — R911 Solitary pulmonary nodule: Secondary | ICD-10-CM

## 2023-05-20 DIAGNOSIS — G43709 Chronic migraine without aura, not intractable, without status migrainosus: Secondary | ICD-10-CM

## 2023-05-20 DIAGNOSIS — I728 Aneurysm of other specified arteries: Secondary | ICD-10-CM

## 2023-05-20 DIAGNOSIS — G43109 Migraine with aura, not intractable, without status migrainosus: Secondary | ICD-10-CM

## 2023-05-20 DIAGNOSIS — E041 Nontoxic single thyroid nodule: Secondary | ICD-10-CM

## 2023-05-20 LAB — TROPONIN I (HIGH SENSITIVITY): Troponin I (High Sensitivity): 5 ng/L (ref <18)

## 2023-05-20 MED ORDER — HYDROMORPHONE HCL 1 MG/ML IJ SOLN
0.5000 mg | Freq: Once | INTRAMUSCULAR | Status: AC
  Administered 2023-05-20: 0.5 mg via INTRAVENOUS
  Filled 2023-05-20: qty 1

## 2023-05-20 MED ORDER — HYDROMORPHONE HCL 1 MG/ML IJ SOLN
1.0000 mg | Freq: Once | INTRAMUSCULAR | Status: AC
  Administered 2023-05-20: 1 mg via INTRAVENOUS
  Filled 2023-05-20: qty 1

## 2023-05-20 MED ORDER — ONABOTULINUMTOXINA 100 UNITS IJ SOLR
200.0000 [IU] | Freq: Once | INTRAMUSCULAR | Status: AC
Start: 2023-05-20 — End: 2023-05-20
  Administered 2023-05-20: 155 [IU] via INTRAMUSCULAR

## 2023-05-20 NOTE — Patient Instructions (Addendum)
If you do not hear anything about your referral in the next 1-2 weeks, call our office and ask for an update.  Someone should reach out regarding your ultrasound.   Give [place] 2-3 business days to get the results of your labs back.   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 If we need to adjust that plan, I will be in touch in the next week or so. No news is good news.   Let us know if you need anything.

## 2023-05-20 NOTE — Telephone Encounter (Signed)
For the past 2 rounds of Botox, the Botox wears off in the last 2-3 weeks.  During the last 2-3 weeks prior to next injections, she suffers a migraine about every other day.  I have advised her to take Nurtec daily for that period of time.

## 2023-05-20 NOTE — Progress Notes (Signed)
[facility]  ° °Procedure Note Botox ° °Attending: Dr. Jekhi Bolin ° °Preoperative Diagnosis(es): Chronic migraine ° °Consent obtained from: The patient °Benefits discussed included, but were not limited to decreased muscle tightness, increased joint range of motion, and decreased pain.  Risk discussed included, but were not limited pain and discomfort, bleeding, bruising, excessive weakness, venous thrombosis, muscle atrophy and dysphagia.  Anticipated outcomes of the procedure as well as he risks and benefits of the alternatives to the procedure, and the roles and tasks of the personnel to be involved, were discussed with the patient, and the patient consents to the procedure and agrees to proceed. A copy of the patient medication guide was given to the patient which explains the blackbox warning. ° °Patients identity and treatment sites confirmed Yes.  . ° °Details of Procedure: °Skin was cleaned with alcohol. Prior to injection, the needle plunger was aspirated to make sure the needle was not within a blood vessel.  There was no blood retrieved on aspiration.   ° °Following is a summary of the muscles injected  And the amount of Botulinum toxin used: ° °Dilution °200 units of Botox was reconstituted with 4 ml of preservative free normal saline. °Time of reconstitution: At the time of the office visit (<30 minutes prior to injection)  ° °Injections  °155 total units of Botox was injected with a 30 gauge needle. ° °Injection Sites: °L occipitalis: 15 units- 3 sites  °R occiptalis: 15 units- 3 sites ° °L upper trapezius: 15 units- 3 sites °R upper trapezius: 15 units- 3 sits          °L paraspinal: 10 units- 2 sites °R paraspinal: 10 units- 2 sites ° °Face °L frontalis(2 injection sites):10 units   °R frontalis(2 injection sites):10 units         °L corrugator: 5 units   °R corrugator: 5 units           °Procerus: 5 units   °L temporalis: 20 units °R temporalis: 20 units  ° °Agent:  °200 units of botulinum Type  A (Onobotulinum Toxin type A) was reconstituted with 4 ml of preservative free normal saline.  °Time of reconstitution: At the time of the office visit (<30 minutes prior to injection)  ° ° ° Total injected (Units):  155 ° Total wasted (Units):  45 ° °Patient tolerated procedure well without complications.   °Reinjection is anticipated in 3 months. ° ° °

## 2023-05-20 NOTE — ED Provider Notes (Signed)
[facility] Provider Note   CSN: [id number] Arrival date & time: 05/19/23  1921     History  Chief Complaint  Patient presents with   Chest Pain    Paula Stout is a 67 y.o. female.   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  Pain was intermittent for the past week but has been constant for the past week now.  Pain is in the center of her chest radiates to both shoulders.  Worse with movement.  Some associated shortness of breath.  No cough or fever.  It is not exertional or pleuritic denies any history of known CAD reports negative stress test March 2023.  She was seen for similar pain 4 days ago and reports it is the same as what she had at that time.  Also with diffuse headache that is gradual in onset similar to previous migraines.  Associated with photophobia and phonophobia.  No focal weakness, numbness or tingling.  No fever.  No neck pain or stiffness.  No vomiting but has had nausea. She is on Eliquis and denies any missed doses.   The history is provided by the patient.  Chest Pain Associated symptoms: headache, nausea and shortness of breath   Associated symptoms: no abdominal pain, no dizziness, no fatigue, no fever, no vomiting and no weakness        Home Medications Prior to Admission medications   Medication Sig Start Date End Date Taking? Authorizing Provider  acetaminophen (TYLENOL) 500 MG tablet Take 1,000 mg by mouth every 6 (six) hours as needed for mild pain.    [provider]  albuterol (VENTOLIN HFA) 108 (90 Base) MCG/ACT inhaler Inhale 2 puffs into the lungs every 4 (four) hours as needed for wheezing. 06/23/20   Sharlene Dory, DO  Ascorbic Acid (VITAMIN C PO) Take 1 tablet by mouth daily.    [provider]  atorvastatin (LIPITOR) 80 MG  tablet TAKE 1 TABLET DAILY 02/09/23   Sharlene Dory, DO  blood glucose meter kit and supplies One Touch Ultra  Check blood sugars once daily Dx: E11.9 Patient taking differently: 1 each by Other route See admin instructions. One Touch Ultra  Check blood sugars once daily Dx: E11.9 10/01/21   Sharlene Dory, DO  botulinum toxin Type A (BOTOX) 200 units injection Inject 155 units IM into multiple site in the face,neck and head once every 90 days 06/15/22   Drema Dallas, DO  buPROPion (WELLBUTRIN XL) 300 MG 24 hr tablet TAKE 1 TABLET DAILY 10/29/22   Sharlene Dory, DO  Cholecalciferol (VITAMIN D3) 125 MCG (5000 UT) capsule Take 5,000 Units by mouth daily.    [provider]  Cyanocobalamin (VITAMIN B-12) 5000 MCG TBDP Take 5,000 mcg by mouth daily.    [provider]  dapagliflozin propanediol (FARXIGA) 10 MG TABS tablet Take 1 tablet (10 mg total) by mouth daily. 03/23/23   Sharlene Dory, DO  diltiazem (CARDIZEM) 30 MG tablet Take 1 tablet every 4 hours AS NEEDED for AFIB or SVT heart rate >95 as long as top BP >100. Patient not taking: Reported on 04/25/2023 12/03/22   Sherie Don, NP  diphenhydrAMINE (BENADRYL) 25 MG tablet Take 25 mg by mouth daily as needed for allergies (seasonal).    [provider]  Dulaglutide (TRULICITY) 1.5 MG/0.5ML  SOPN Inject 1.5 mg into the skin once a week. 03/18/23   Sharlene Dory, DO  ELIQUIS 5 MG TABS tablet TAKE 1 TABLET TWICE A DAY 02/02/23   Sharlene Dory, DO  EPINEPHrine 0.3 mg/0.3 mL IJ SOAJ injection Inject 0.3 mg into the muscle as needed for anaphylaxis. 11/17/20   Sharlene Dory, DO  ezetimibe (ZETIA) 10 MG tablet TAKE 1 TABLET DAILY 02/03/23   Sharlene Dory, DO  fenofibrate 160 MG tablet Take 1 tablet (160 mg total) by mouth daily. 11/23/22   Sharlene Dory, DO  fluticasone (FLONASE) 50 MCG/ACT nasal spray Place 2 sprays into both nostrils daily. Patient  taking differently: Place 2 sprays into both nostrils 3 (three) times a week. 01/29/23   Junie Spencer, FNP  levocetirizine (XYZAL) 5 MG tablet TAKE 1 TABLET EVERY EVENING 01/14/23   Wendling, Jilda Roche, DO  Lurasidone HCl 120 MG TABS Take 1 tablet (120 mg total) by mouth at bedtime. 08/03/22   Sharlene Dory, DO  magnesium oxide (MAG-OX) 400 (240 Mg) MG tablet Take 400 mg by mouth daily.    [provider]  metFORMIN (GLUCOPHAGE-XR) 500 MG 24 hr tablet Take 1 tablet (500 mg total) by mouth 2 (two) times daily with a meal. 04/20/23   Wendling, Jilda Roche, DO  metoprolol succinate (TOPROL-XL) 100 MG 24 hr tablet Take 1 tablet (100 mg total) by mouth 2 (two) times daily. Take with or immediately following a meal. 04/12/23   Camnitz, Andree Coss, MD  metoprolol tartrate (LOPRESSOR) 25 MG tablet Take 1 tablet (25 mg total) by mouth every 4 (four) hours as needed (PALPITATIONS). 08/24/22   Camnitz, Andree Coss, MD  Multiple Vitamins-Minerals (MULTIVITAMIN WITH MINERALS) tablet Take 1 tablet by mouth daily.    [provider]  nitroGLYCERIN (NITROSTAT) 0.4 MG SL tablet Place 1 tablet (0.4 mg total) under the tongue every 5 (five) minutes as needed for chest pain. 03/16/23   Georgeanna Lea, MD  ondansetron (ZOFRAN-ODT) 4 MG disintegrating tablet Take 1 tablet (4 mg total) by mouth every 8 (eight) hours as needed for nausea or vomiting. 01/27/23   Wendling, Jilda Roche, DO  ONETOUCH VERIO test strip USE DAILY TO CHECK BLOOD SUGAR 10/29/22   Carmelia Roller, Jilda Roche, DO  pantoprazole (PROTONIX) 40 MG tablet Take 1 tablet (40 mg total) by mouth daily. 11/22/22   Sharlene Dory, DO  QUEtiapine (SEROQUEL) 300 MG tablet Take 1 tablet (300 mg total) by mouth at bedtime. 08/03/22   Sharlene Dory, DO  Rimegepant Sulfate (NURTEC) 75 MG TBDP Take 1 tablet (75 mg total) by mouth as needed (take 1 tab at the earlist onset of a migraine. Max 1 tab in 24 hours). 04/04/23    Everlena Cooper, Adam R, DO  topiramate (TOPAMAX) 50 MG tablet TAKE 3 TABLETS TWICE A DAY Patient taking differently: Take 150 mg by mouth 2 (two) times daily. 01/14/23   Sharlene Dory, DO  Vilazodone HCl (VIIBRYD) 40 MG TABS Take 1 tablet (40 mg total) by mouth daily. 04/06/23   Joan Flores, NP      Allergies    Bee venom, Onion, Tetanus-diphtheria toxoids td, Sumatriptan, Zolpidem, Asa [aspirin], Iodine, Nsaids, Sulfa antibiotics, and Vancomycin    Review of Systems   Review of Systems  Constitutional:  Negative for activity change, fatigue and fever.  HENT:  Negative for congestion.   Eyes:  Positive for photophobia.  Respiratory:  Positive for chest tightness and shortness  of breath.   Cardiovascular:  Positive for chest pain.  Gastrointestinal:  Positive for nausea. Negative for abdominal pain and vomiting.  Genitourinary:  Negative for dysuria and hematuria.  Musculoskeletal:  Negative for arthralgias and myalgias.  Skin:  Negative for rash.  Neurological:  Positive for headaches. Negative for dizziness, weakness and light-headedness.   all other systems are negative except as noted in the HPI and PMH.    Physical Exam Updated Vital Signs BP 134/77   Pulse 65   Temp 97.8 F (36.6 C) (Oral)   Resp 15   SpO2 100%  Physical Exam Vitals and nursing note reviewed.  Constitutional:      General: She is not in acute distress.    Appearance: She is well-developed.  HENT:     Head: Normocephalic and atraumatic.     Mouth/Throat:     Pharynx: No oropharyngeal exudate.  Eyes:     Conjunctiva/sclera: Conjunctivae normal.     Pupils: Pupils are equal, round, and reactive to light.  Neck:     Comments: No meningismus. Cardiovascular:     Rate and Rhythm: Normal rate and regular rhythm.     Heart sounds: Normal heart sounds. No murmur heard. Pulmonary:     Effort: Pulmonary effort is normal. No respiratory distress.     Breath sounds: Normal breath sounds.  Chest:      Chest wall: No tenderness.  Abdominal:     Palpations: Abdomen is soft.     Tenderness: There is no abdominal tenderness. There is no guarding or rebound.  Musculoskeletal:        General: No tenderness. Normal range of motion.     Cervical back: Normal range of motion and neck supple.  Skin:    General: Skin is warm.  Neurological:     Mental Status: She is alert and oriented to person, place, and time.     Cranial Nerves: No cranial nerve deficit.     Motor: No abnormal muscle tone.     Coordination: Coordination normal.     Comments:  5/5 strength throughout. CN 2-12 intact.Equal grip strength.   Psychiatric:        Behavior: Behavior normal.     ED Results / Procedures / Treatments   Labs (all labs ordered are listed, but only abnormal results are displayed) Labs Reviewed  BASIC METABOLIC PANEL - Abnormal; Notable for the following components:      Result Value   CO2 20 (*)    Glucose, Bld 140 (*)    Creatinine, Ser 1.90 (*)    GFR, Estimated 29 (*)    All other components within normal limits  CBC WITH DIFFERENTIAL/PLATELET - Abnormal; Notable for the following components:   RDW 16.4 (*)    All other components within normal limits  TROPONIN I (HIGH SENSITIVITY)  TROPONIN I (HIGH SENSITIVITY)  TROPONIN I (HIGH SENSITIVITY)  TROPONIN I (HIGH SENSITIVITY)    EKG EKG Interpretation Date/Time:  Thursday May 19 2023 19:31:07 EDT Ventricular Rate:  66 PR Interval:    QRS Duration:  91 QT Interval:  453 QTC Calculation: 475 R Axis:   -34  Text Interpretation: likely sinus Left axis deviation Low voltage, precordial leads Consider anterior infarct No significant change was found Confirmed by Glynn Octave [phone]) on 05/19/2023 11:24:29 PM  Radiology CT CHEST WO CONTRAST  Result Date: 05/20/2023 CLINICAL DATA:  Positive D-dimer and chest pain. EXAM: CT CHEST WITHOUT CONTRAST TECHNIQUE: Multidetector CT imaging of the chest was  performed following the  standard protocol without IV contrast. RADIATION DOSE REDUCTION: This exam was performed according to the departmental dose-optimization program which includes automated exposure control, adjustment of the mA and/or kV according to patient size and/or use of iterative reconstruction technique. COMPARISON:  None Available. FINDINGS: Cardiovascular: Heart is mildly enlarged. There is no pericardial effusion. Aorta is normal in size. Mediastinum/Nodes: Exophytic inferior right thyroid nodule extending into the mediastinum measures 2.0 by 2.8 cm. There are no enlarged mediastinal or hilar lymph nodes. Visualized esophagus is within normal limits. Lungs/Pleura: Focal nodular density with slightly irregular borders is seen in the superior segment of the right lower lobe measuring 1.3 x 1.6 cm. The lungs are otherwise clear. 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 No acute findings. Musculoskeletal: No chest wall mass or suspicious bone lesions identified. IMPRESSION: 1. Suspicious right solid pulmonary nodule measuring 16 mm. Per Fleischner Society Guidelines, consider a non-contrast Chest CT at 3 months, a PET/CT, or tissue sampling. These guidelines do not apply to immunocompromised patients and patients with cancer. Follow up in patients with significant comorbidities as clinically warranted. For lung cancer screening, adhere to Lung-RADS guidelines. Reference: Radiology. 2017; 284(1):228-43. 2. Incidental right thyroid nodule measuring 2.8 cm. Recommend non-emergent thyroid ultrasound. Reference: J Am Coll Radiol. 2015 Feb;12(2): 143-50 3. Mild cardiomegaly. 4. 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 Electronically Signed   By: Darliss Cheney M.D.   On: 05/20/2023 02:39   CT Head Wo Contrast  Result Date: 05/20/2023 CLINICAL DATA:  Headache. EXAM: CT HEAD WITHOUT CONTRAST TECHNIQUE: Contiguous axial images were obtained from the base of the skull through the vertex  without intravenous contrast. RADIATION DOSE REDUCTION: This exam was performed according to the departmental dose-optimization program which includes automated exposure control, adjustment of the mA and/or kV according to patient size and/or use of iterative reconstruction technique. COMPARISON:  October 15, 2021 FINDINGS: Brain: There is mild cerebral atrophy with widening of the extra-axial spaces and ventricular dilatation. There are areas of decreased attenuation within the white matter tracts of the supratentorial brain, consistent with microvascular disease changes. Vascular: No hyperdense vessel or unexpected calcification. Skull: Normal. Negative for fracture or focal lesion. Sinuses/Orbits: No acute finding. Other: None. IMPRESSION: 1. Generalized cerebral atrophy with chronic white matter small vessel ischemic changes. 2. No acute intracranial abnormality. Electronically Signed   By: Aram Candela M.D.   On: 05/20/2023 00:07   DG Chest 2 View  Result Date: 05/19/2023 CLINICAL DATA:  Two-week history of intermittent chest pain EXAM: CHEST - 2 VIEW COMPARISON:  Chest radiograph dated 05/15/2023 FINDINGS: Normal lung volumes. No focal consolidations. No pleural effusion or pneumothorax. The heart size and mediastinal contours are within normal limits. No acute osseous abnormality. IMPRESSION: No active cardiopulmonary disease. Electronically Signed   By: Agustin Cree M.D.   On: 05/19/2023 21:09    Procedures Procedures    Medications Ordered in ED Medications  metoCLOPramide (REGLAN) injection 10 mg (has no administration in time range)  diphenhydrAMINE (BENADRYL) injection 25 mg (has no administration in time range)  dexamethasone (DECADRON) injection 10 mg (has no administration in time range)  promethazine (PHENERGAN) injection 12.5 mg (has no administration in time range)    ED Course/ Medical Decision Making/ A&P                                 Medical Decision Making Amount  and/or Complexity of Data Reviewed Labs: ordered. Decision-making details documented in ED Course. Radiology: ordered and independent interpretation performed. Decision-making details documented in ED Course. ECG/medicine tests: ordered and independent interpretation performed. Decision-making details documented in ED Course.  Risk Prescription drug management.   2 weeks of constant chest pain.  EKG is sinus rhythm no acute ST changes.  Also gradual onset headache similar to previous migraines.  No thunderclap onset.  Neurological exam is nonfocal.  Troponin is negative x 2.  Low suspicion for ACS with ongoing constant pain for the past 2 weeks.  Headache similar to previous migraines.  Denies thunderclap onset.  Will obtain CT imaging given her history of anticoagulation use.  She has a pain medication regimen on her phone from her neurologist that consists of Dilaudid, Decadron, Benadryl and Phenergan.  Workup is reassuring.  CT scan obtained to rule out pericardial effusion.  This is negative.  No missed doses of her Xarelto with low suspicion for PE.  CT scan as above shows no pericardial effusion.  Does show incidental lung nodule as well as splenic artery aneurysm and thyroid nodule which she is made aware of.  Need follow-up with pulmonology as well as vascular surgery.  Headache has resolved.  She has appointment with neurology later today.  She also has a cardiology appointment in 4 days.  She is tolerating p.o. and ambulatory.  Headache has resolved and chest pain is improved.  No evidence of ACS with troponin negative x 2.  Low suspicion for pulmonary embolism or aortic dissection given ongoing pain for several weeks and no missed doses of Eliquis.  She has follow-up with her neurologist later today.  She is made aware of incidental findings including splenic artery aneurysm, lung nodule and thyroid nodule and need for follow-up. With her cardiologist is scheduled in 4  days.  Return to the ED sooner with exertional chest pain, pain associate with shortness of breath, nausea, vomiting, sweating or other concerns.       Final Clinical Impression(s) / ED Diagnoses Final diagnoses:  Bad headache  Atypical chest pain    Rx / DC Orders ED Discharge Orders     None         Maika Kaczmarek, Jeannett Senior, MD 05/20/23 0522

## 2023-05-20 NOTE — Telephone Encounter (Signed)
Pt came in for botox, but wanted Dr. Everlena Cooper to be aware that she was just in the ED and they told her to make sure Dr. Everlena Cooper is aware

## 2023-05-20 NOTE — Progress Notes (Signed)
Chief Complaint  Patient presents with   follow-up from ED    Discuss results from ED    Subjective: Patient is a 68 y.o. female here for ER follow-up.  Patient went to the emergency department for acute migraine and chest pain.  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  She is here to follow-up on that.  She is not having any unexplained weight loss or fatigue out of the ordinary.  She has never smoked.  Past Medical History:  Diagnosis Date   ACS (acute coronary syndrome) (HCC) 07/31/2021   Adhesive capsulitis of left shoulder 12/16/2016   Asthma without status asthmaticus 02/06/2021   At risk for falls 02/04/2019   Atrial fibrillation (HCC) 09/01/2014   Last Assessment & Plan:  Formatting of this note might be different from the original. A:  Chronic.  Sinus rhythm at this time.  States she was told this during admission at [facility] in the past. P:  On baby aspirin at home will continue.  Low dose metoprolol started.   Bipolar 1 disorder, depressed, moderate (HCC) 02/18/2020   Capsulitis of left shoulder 10/20/2021   Chronic anticoagulation 02/20/2020   Chronic atrial fibrillation (HCC) 02/18/2020   Chronic headache 10/11/2013   Chronic interstitial cystitis 12/28/2005   Chronic migraine without aura, intractable, without status migrainosus 06/24/2017   Colon cancer screening 04/15/2016   Last Assessment & Plan:  Formatting of this note might be different from the original. Will schedule for colonoscopy   Depression, recurrent (HCC) 02/18/2020   Diabetes mellitus (HCC)    Dyslipidemia 02/20/2020   GAD (generalized anxiety disorder) 02/18/2020   GERD (gastroesophageal reflux disease) 11/05/2021   History of atrial fibrillation 06/24/2017   History of posttraumatic stress disorder (PTSD) 10/07/2016   Hyperosmolarity due to secondary diabetes mellitus (HCC) 02/06/2021   Impaired mobility  and activities of daily living 11/14/2015   Irritable bowel syndrome (IBS)    Junctional tachycardia 11/05/2021   Kidney stone 02/06/2021   Migraine without aura, not refractory 02/06/2021   Mixed hyperlipidemia 04/06/2021   Last Assessment & Plan:  Formatting of this note might be different from the original. 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 Controlled   Refractory migraine with aura 02/06/2021   Refractory migraine without aura 02/06/2021   Renal colic on left side 04/22/2017   Stage 3 chronic kidney disease (HCC) 02/20/2020   Unspecified adverse effect of other drug, medicinal and biological substance(995.29) 02/17/2006   Urinary tract obstruction 02/06/2021    Objective: BP 122/80 (BP Location: Left Arm, Patient Position: Sitting, Cuff Size: Normal)   Pulse 69   Temp 98 F (36.7 C) (Oral)   Ht 5\' 6"  (1.676 m)   Wt 216 lb 4 oz (98.1 kg)   SpO2 93%   BMI 34.90 kg/m  General: Awake, appears stated age Lungs: No accessory muscle use Psych: Age appropriate judgment and insight, normal affect and mood  Assessment and Plan: Lung nodule seen on imaging study - Plan: Ambulatory referral to Pulmonology  Thyroid nodule greater than or equal to 1.5 cm in diameter incidentally noted on  imaging study - Plan: US THYROID, TSH, T4, free  Splenic artery aneurysm (HCC)  Refer to pulmonology to discuss options to follow-up on this.  Options are CT chest repeat in 3 months, PET scan in 3 months, or tissue sampling. Check thyroid labs today.  Order ultrasound. Asymptomatic. <2.5 cm, will monitor.  The patient voiced understanding and agreement to the plan.  Jilda Roche [place], DO 05/20/23  3:49 PM

## 2023-05-20 NOTE — ED Notes (Signed)
Patient was picked up be her daughter.

## 2023-05-20 NOTE — Discharge Instructions (Addendum)
Your testing is reassuring.  No evidence of heart attack.  You are low risk for heart disease but nonzero risk.  Follow-up with your cardiologist as scheduled this week.  Your CT scan today showed multiple incidental findings that need further follow-up.  You should have an ultrasound of your thyroid gland by your PCP.  You have a lung nodule which needs follow-up in 3 months to ensure it is not changing in size or you can follow-up with a pulmonologist for further evaluation of this.  You also have a aneurysm of your splenic artery and should follow-up with the vascular surgeon to have this monitored. Return to the ED with exertional chest pain, pain associate with shortness of breath, nausea, vomiting, sweating or other concerns  Return to the ED with new or worsening symptoms.

## 2023-05-21 LAB — TSH: TSH: 0.85 m[IU]/L (ref 0.40–4.50)

## 2023-05-21 LAB — T4, FREE: Free T4: 1 ng/dL (ref 0.8–1.8)

## 2023-05-24 ENCOUNTER — Ambulatory Visit
Admission: RE | Admit: 2023-05-24 | Discharge: 2023-05-24 | Disposition: A | Discharge status: Home / Self Care | Payer: Medicare (Managed Care) | Source: Ambulatory Visit | Attending: Family Medicine | Admitting: Family Medicine

## 2023-05-24 ENCOUNTER — Ambulatory Visit (INDEPENDENT_AMBULATORY_CARE_PROVIDER_SITE_OTHER): Payer: Medicare (Managed Care) | Admitting: Cardiology

## 2023-05-24 ENCOUNTER — Ambulatory Visit: Payer: Medicare (Managed Care)

## 2023-05-24 ENCOUNTER — Encounter: Payer: Self-pay | Admitting: Cardiology

## 2023-05-24 VITALS — BP 92/60 | HR 68 | Ht 66.0 in | Wt 215.0 lb

## 2023-05-24 DIAGNOSIS — E1165 Type 2 diabetes mellitus with hyperglycemia: Secondary | ICD-10-CM | POA: Diagnosis not present

## 2023-05-24 DIAGNOSIS — I480 Paroxysmal atrial fibrillation: Secondary | ICD-10-CM

## 2023-05-24 DIAGNOSIS — R0609 Other forms of dyspnea: Secondary | ICD-10-CM

## 2023-05-24 DIAGNOSIS — I471 Supraventricular tachycardia, unspecified: Secondary | ICD-10-CM

## 2023-05-24 DIAGNOSIS — R079 Chest pain, unspecified: Secondary | ICD-10-CM | POA: Diagnosis not present

## 2023-05-24 DIAGNOSIS — E041 Nontoxic single thyroid nodule: Secondary | ICD-10-CM | POA: Diagnosis not present

## 2023-05-24 DIAGNOSIS — E042 Nontoxic multinodular goiter: Secondary | ICD-10-CM | POA: Diagnosis not present

## 2023-05-24 NOTE — Patient Instructions (Addendum)
Medication Instructions:  Your physician recommends that you continue on your current medications as directed. Please refer to the Current Medication list given to you today.  *If you need a refill on your cardiac medications before your next appointment, please call your pharmacy*   Lab Work: None Ordered If you have labs (blood work) drawn today and your tests are completely normal, you will receive your results only by: MyChart Message (if you have MyChart) OR A paper copy in the mail If you have any lab test that is abnormal or we need to change your treatment, we will call you to review the results.   Testing/Procedures: Your physician has requested that you have an echocardiogram. Echocardiography is a painless test that uses sound waves to create images of your heart. It provides your doctor with information about the size and shape of your heart and how well your heart's chambers and valves are working. This procedure takes approximately one hour. There are no restrictions for this procedure. Please do NOT wear cologne, perfume, aftershave, or lotions (deodorant is allowed). Please arrive 15 minutes prior to your appointment time.   WHY IS MY DOCTOR PRESCRIBING ZIO? The Zio system is proven and trusted by physicians to detect and diagnose irregular heart rhythms -- and has been prescribed to hundreds of thousands of patients.  The FDA has cleared the Zio system to monitor for many different kinds of irregular heart rhythms. In a study, physicians were able to reach a diagnosis 90% of the time with the Zio system1.  You can wear the Zio monitor -- a small, discreet, comfortable patch -- during your normal day-to-day activity, including while you sleep, shower, and exercise, while it records every single heartbeat for analysis.  1Barrett, P., et al. Comparison of 24 Hour Holter Monitoring Versus 14 Day Novel Adhesive Patch Electrocardiographic Monitoring. American Journal of Medicine,  2014.  ZIO VS. HOLTER MONITORING The Zio monitor can be comfortably worn for up to 14 days. Holter monitors can be worn for 24 to 48 hours, limiting the time to record any irregular heart rhythms you may have. Zio is able to capture data for the 51% of patients who have their first symptom-triggered arrhythmia after 48 hours.1  LIVE WITHOUT RESTRICTIONS The Zio ambulatory cardiac monitor is a small, unobtrusive, and water-resistant patch--you might even forget you're wearing it. The Zio monitor records and stores every beat of your heart, whether you're sleeping, working out, or showering.      Follow-Up: At [facility], you and your health needs are our priority.  As part of our continuing mission to provide you with exceptional heart care, we have created designated Provider Care Teams.  These Care Teams include your primary Cardiologist (physician) and Advanced Practice Providers (APPs -  Physician Assistants and Nurse Practitioners) who all work together to provide you with the care you need, when you need it.  We recommend signing up for the patient portal called "MyChart".  Sign up information is provided on this After Visit Summary.  MyChart is used to connect with patients for Virtual Visits (Telemedicine).  Patients are able to view lab/test results, encounter notes, upcoming appointments, etc.  Non-urgent messages can be sent to your provider as well.   To learn more about what you can do with MyChart, go to [email or web address].    Your next appointment:   2 month(s)  The format for your next appointment:   In Person  Provider:   Gypsy Balsam, MD  Other Instructions NA

## 2023-05-24 NOTE — Addendum Note (Signed)
Addended by: Baldo Ash D on: 05/24/2023 02:59 PM   Modules accepted: Orders

## 2023-05-24 NOTE — Progress Notes (Signed)
Cardiology Office Note:    Date:  05/24/2023   ID:  Paula Stout, DOB 28-Dec-1955, MRN [id number]  PCP:  Sharlene Dory, DO  Cardiologist:  Gypsy Balsam, MD    Referring MD: Sharlene Dory*   Chief Complaint  Patient presents with   Chest Pain    History of Present Illness:    Paula Stout is a 67 y.o. female with past medical history significant for paroxysmal atrial fibrillation status post atrial fibrillation ablation and SVT ablation with SVT since the ablation done just recently.  Additional problem include dyslipidemia and type 2 diabetes.  She comes today to my office and she said from the get go that ablation did not work I will ask her what were her expectation after ablation as she complained still having some tachyarrhythmias as well as chest pain.  The way she described like arrhythmia she is describing heart rate speeding up up to 90 sometimes 220 with no reasons.  In terms of chest pain she described continued sensation in the chest there is no only relieving no aggravating factors slightly getting better she was in the emergency room CT of her chest was done there is no pericardial effusion, her biochemical markers were normal.  She is overall disappointed with the results of her ablation but I am not sure if she understood fully what was the expectation.  Past Medical History:  Diagnosis Date   ACS (acute coronary syndrome) (HCC) 07/31/2021   Adhesive capsulitis of left shoulder 12/16/2016   Asthma without status asthmaticus 02/06/2021   At risk for falls 02/04/2019   Atrial fibrillation (HCC) 09/01/2014   Last Assessment & Plan:  Formatting of this note might be different from the original. A:  Chronic.  Sinus rhythm at this time.  States she was told this during admission at [facility] in the past. P:  On baby aspirin at home will continue.  Low dose metoprolol started.   Bipolar 1 disorder, depressed, moderate (HCC) 02/18/2020   Capsulitis of  left shoulder 10/20/2021   Chronic anticoagulation 02/20/2020   Chronic atrial fibrillation (HCC) 02/18/2020   Chronic headache 10/11/2013   Chronic interstitial cystitis 12/28/2005   Chronic migraine without aura, intractable, without status migrainosus 06/24/2017   Colon cancer screening 04/15/2016   Last Assessment & Plan:  Formatting of this note might be different from the original. Will schedule for colonoscopy   Depression, recurrent (HCC) 02/18/2020   Diabetes mellitus (HCC)    Dyslipidemia 02/20/2020   GAD (generalized anxiety disorder) 02/18/2020   GERD (gastroesophageal reflux disease) 11/05/2021   History of atrial fibrillation 06/24/2017   History of posttraumatic stress disorder (PTSD) 10/07/2016   Hyperosmolarity due to secondary diabetes mellitus (HCC) 02/06/2021   Impaired mobility and activities of daily living 11/14/2015   Irritable bowel syndrome (IBS)    Junctional tachycardia 11/05/2021   Kidney stone 02/06/2021   Migraine without aura, not refractory 02/06/2021   Mixed hyperlipidemia 04/06/2021   Last Assessment & Plan:  Formatting of this note might be different from the original. 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 Controlled   Refractory migraine with aura 02/06/2021   Refractory migraine without aura 02/06/2021   Renal  colic on left side 04/22/2017   Stage 3 chronic kidney disease (HCC) 02/20/2020   Unspecified adverse effect of other drug, medicinal and biological substance(995.29) 02/17/2006   Urinary tract obstruction 02/06/2021    Past Surgical History:  Procedure Laterality Date   ABDOMINAL HYSTERECTOMY     Fibroids   ATRIAL  FIBRILLATION ABLATION     ATRIAL FIBRILLATION ABLATION N/A 10/27/2022   Procedure: ATRIAL FIBRILLATION ABLATION;  Surgeon: Regan Lemming, MD;  Location: [facility] INVASIVE CV LAB;  Service: Cardiovascular;  Laterality: N/A;   CARDIAC CATHETERIZATION     CARDIOVERSION     CESAREAN SECTION     KNEE SURGERY     LEFT HEART CATH AND CORONARY ANGIOGRAPHY N/A 03/04/2020   Procedure: LEFT HEART CATH AND CORONARY ANGIOGRAPHY;  Surgeon: Lyn Records, MD;  Location: [facility] INVASIVE CV LAB;  Service: Cardiovascular;  Laterality: N/A;   ROTATOR CUFF REPAIR     SVT ABLATION N/A 05/04/2023   Procedure: SVT ABLATION;  Surgeon: Regan Lemming, MD;  Location: [facility] INVASIVE CV LAB;  Service: Cardiovascular;  Laterality: N/A;    Current Medications: Current Meds  Medication Sig   acetaminophen (TYLENOL) 500 MG tablet Take 1,000 mg by mouth every 6 (six) hours as needed for mild pain.   albuterol (VENTOLIN HFA) 108 (90 Base) MCG/ACT inhaler Inhale 2 puffs into the lungs every 4 (four) hours as needed for wheezing.   Ascorbic Acid (VITAMIN C PO) Take 1 tablet by mouth daily.   atorvastatin (LIPITOR) 80 MG tablet TAKE 1 TABLET DAILY   blood glucose meter kit and supplies One Touch Ultra  Check 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 One Touch Ultra  Check blood sugars once daily Dx: E11.9)   botulinum toxin Type A (BOTOX) 200 units injection Inject 155 units IM into multiple site in the face,neck and head once every 90 days (Patient taking differently: Inject 155 Units into the muscle See admin instructions. Inject 155 units IM into multiple site in the face,neck and head once every 90 days)   buPROPion (WELLBUTRIN XL) 300 MG 24 hr tablet TAKE 1 TABLET DAILY   Cholecalciferol (VITAMIN D3) 125 MCG (5000 UT) capsule Take 5,000 Units by mouth daily.   Cyanocobalamin (VITAMIN B-12) 5000 MCG TBDP Take 5,000 mcg by mouth daily.   dapagliflozin  propanediol (FARXIGA) 10 MG TABS tablet Take 1 tablet (10 mg total) by mouth daily.   diltiazem (CARDIZEM) 30 MG tablet Take 1 tablet every 4 hours AS NEEDED for AFIB or SVT heart rate >95 as long as top BP >100. (Patient taking differently: Take 30 mg by mouth See admin instructions. Take 1 tablet every 4 hours AS NEEDED for AFIB or SVT heart rate >95 as long as top BP >100.)   diphenhydrAMINE (BENADRYL) 25 MG tablet Take 25 mg by mouth daily as needed for allergies (seasonal).   Dulaglutide (TRULICITY) 1.5 MG/0.5ML SOPN Inject 1.5 mg into the skin once a week.   ELIQUIS 5 MG TABS tablet TAKE 1 TABLET TWICE A DAY   EPINEPHrine 0.3 mg/0.3 mL IJ SOAJ injection Inject 0.3 mg into the muscle as needed for anaphylaxis.   ezetimibe (ZETIA) 10 MG tablet TAKE 1 TABLET DAILY   fenofibrate 160 MG tablet Take 1 tablet (160 mg total) by mouth daily.   fluticasone (FLONASE) 50 MCG/ACT nasal spray Place 2 sprays into both nostrils daily. (Patient taking differently: Place 2 sprays into both nostrils  3 (three) times a week.)   levocetirizine (XYZAL) 5 MG tablet TAKE 1 TABLET EVERY EVENING (Patient taking differently: Take 5 mg by mouth every evening.)   Lurasidone HCl 120 MG TABS Take 1 tablet (120 mg total) by mouth at bedtime.   magnesium oxide (MAG-OX) 400 (240 Mg) MG tablet Take 400 mg by mouth daily.   metFORMIN (GLUCOPHAGE-XR) 500 MG 24 hr tablet Take 1 tablet (500 mg total) by mouth 2 (two) times daily with a meal.   metoprolol succinate (TOPROL-XL) 100 MG 24 hr tablet Take 1 tablet (100 mg total) by mouth 2 (two) times daily. Take with or immediately following a meal.   metoprolol tartrate (LOPRESSOR) 25 MG tablet Take 1 tablet (25 mg total) by mouth every 4 (four) hours as needed (PALPITATIONS).   Multiple Vitamins-Minerals (MULTIVITAMIN WITH MINERALS) tablet Take 1 tablet by mouth daily.   nitroGLYCERIN (NITROSTAT) 0.4 MG SL tablet Place 1 tablet (0.4 mg total) under the tongue every 5 (five) minutes as  needed for chest pain.   ondansetron (ZOFRAN-ODT) 4 MG disintegrating tablet Take 1 tablet (4 mg total) by mouth every 8 (eight) hours as needed for nausea or vomiting.   ONETOUCH VERIO test strip USE DAILY TO CHECK BLOOD SUGAR (Patient taking differently: 1 each by Other route as needed for other (see below). Use daily to check blood sugar.)   pantoprazole (PROTONIX) 40 MG tablet Take 1 tablet (40 mg total) by mouth daily.   QUEtiapine (SEROQUEL) 300 MG tablet Take 1 tablet (300 mg total) by mouth at bedtime.   Rimegepant Sulfate (NURTEC) 75 MG TBDP Take 1 tablet (75 mg total) by mouth as needed (take 1 tab at the earlist onset of a migraine. Max 1 tab in 24 hours).   topiramate (TOPAMAX) 50 MG tablet TAKE 3 TABLETS TWICE A DAY (Patient taking differently: Take 150 mg by mouth 2 (two) times daily.)   Vilazodone HCl (VIIBRYD) 40 MG TABS Take 1 tablet (40 mg total) by mouth daily.     Allergies:   Bee venom, Onion, Tetanus-diphtheria toxoids td, Sumatriptan, Zolpidem, Asa [aspirin], Iodine, Nsaids, Sulfa antibiotics, and Vancomycin   Social History   Socioeconomic History   Marital status: Widowed    Spouse name: Not on file   Number of children: 6   Years of education: Not on file   Highest education level: Associate degree: academic program  Occupational History   Occupation: not employed  Tobacco Use   Smoking status: Never   Smokeless tobacco: Never   Tobacco comments:    Never smoke 07/15/22  Vaping Use   Vaping status: Never Used  Substance and Sexual Activity   Alcohol use: Never    Comment: light social   Drug use: Never   Sexual activity: Not Currently  Other Topics Concern   Not on file  Social History Narrative   Lives in [place] with daughter and son-in-law.  Sews clothing in free time as hobby.    Social Determinants of Health   Financial Resource Strain: Medium Risk (02/16/2023)   Overall Financial Resource Strain (CARDIA)    Difficulty of Paying Living  Expenses: Somewhat hard  Food Insecurity: No Food Insecurity (02/16/2023)   Hunger Vital Sign    Worried About Running Out of Food in the Last Year: Never true    Ran Out of Food in the Last Year: Never true  Transportation Needs: No Transportation Needs (02/16/2023)   PRAPARE - Transportation    Lack of Transportation (  Medical): No    Lack of Transportation (Non-Medical): No  Physical Activity: Unknown (02/16/2023)   Exercise Vital Sign    Days of Exercise per Week: 2 days    Minutes of Exercise per Session: Patient declined  Stress: Stress Concern Present (02/16/2023)   [employer] of Occupational Health - Occupational Stress Questionnaire    Feeling of Stress : To some extent  Social Connections: Socially Isolated (02/16/2023)   Social Connection and Isolation Panel [NHANES]    Frequency of Communication with Friends and Family: More than three times a week    Frequency of Social Gatherings with Friends and Family: Never    Attends Religious Services: Never    Database administrator or Organizations: No    Attends Engineer, structural: Not on file    Marital Status: Widowed     Family History: The patient's family history includes Bipolar disorder in her daughter and son; Migraines in her daughter, daughter, and son. She was adopted. ROS:   Please see the history of present illness.    All 14 point review of systems negative except as described per history of present illness  EKGs/Labs/Other Studies Reviewed:    EKG Interpretation Date/Time:  Tuesday May 24 2023 14:04:55 EDT Ventricular Rate:  68 PR Interval:  190 QRS Duration:  90 QT Interval:  430 QTC Calculation: 457 R Axis:   -37  Text Interpretation: Normal sinus rhythm Left axis deviation Cannot rule out Anterior infarct , age undetermined When compared with ECG of 19-May-2023 23:35, PREVIOUS ECG IS PRESENT Confirmed by Gypsy Balsam [phone]) on 05/24/2023 2:29:38 PM    Recent Labs: 07/08/2022:  Magnesium 1.7 11/22/2022: ALT 24 05/15/2023: B Natriuretic Peptide 62.1 05/19/2023: BUN 18; Creatinine, Ser 1.90; Hemoglobin 12.0; Platelets 324; Potassium 4.1; Sodium 139 05/20/2023: TSH 0.85  Recent Lipid Panel    Component Value Date/Time   CHOL 136 11/22/2022 1450   CHOL 141 02/10/2021 0914   TRIG 172.0 (H) 11/22/2022 1450   HDL 34.30 (L) 11/22/2022 1450   HDL 26 (L) 02/10/2021 0914   CHOLHDL 4 11/22/2022 1450   VLDL 34.4 11/22/2022 1450   LDLCALC 67 11/22/2022 1450   LDLCALC 86 02/10/2021 0914    Physical Exam:    VS:  BP 92/60 (BP Location: Left Arm, Patient Position: Sitting)   Pulse 68   Ht 5\' 6"  (1.676 m)   Wt 215 lb (97.5 kg)   SpO2 96%   BMI 34.70 kg/m     Wt Readings from Last 3 Encounters:  05/24/23 215 lb (97.5 kg)  05/20/23 216 lb 4 oz (98.1 kg)  05/18/23 213 lb (96.6 kg)     GEN:  Well nourished, well developed in no acute distress HEENT: Normal NECK: No JVD; No carotid bruits LYMPHATICS: No lymphadenopathy CARDIAC: RRR, no murmurs, no rubs, no gallops RESPIRATORY:  Clear to auscultation without rales, wheezing or rhonchi  ABDOMEN: Soft, non-tender, non-distended MUSCULOSKELETAL:  No edema; No deformity  SKIN: Warm and dry LOWER EXTREMITIES: no swelling NEUROLOGIC:  Alert and oriented x 3 PSYCHIATRIC:  Normal affect   ASSESSMENT:    1. Chest pain, unspecified type   2. SVT (supraventricular tachycardia)   3. Paroxysmal atrial fibrillation (HCC)   4. Type 2 diabetes mellitus with hyperglycemia, without long-term current use of insulin (HCC)    PLAN:    In order of problems listed above:  Chest pain very atypical lasting for days getting better she complained of having some shortness of breath.  There is no pleuritic component to her pain.  Leaning forward does not make pain worse.  I did review CT of her chest done on 13th of this month which was showing mild cardiomegaly but no pericardial effusion overall that was looking fine. SVT status post  ablation, I will ask her to wear Zio patch for 2 weeks to see if she get any recurrences of arrhythmia however from description she gave me I doubt that this is supraventricular tachycardia.  Since she does have some shortness of breath I will ask her to have an echocardiogram done to assess left ventricle ejection fraction. Type 2 diabetes that being followed by antimedicine team last hemoglobin A1c from March of this year was 6.2. Dyslipidemia I did review K PN LDL 67 HDL 34 good cholesterol control   Medication Adjustments/Labs and Tests Ordered: Current medicines are reviewed at length with the patient today.  Concerns regarding medicines are outlined above.  Orders Placed This Encounter  Procedures   EKG 12-Lead   Medication changes: No orders of the defined types were placed in this encounter.   Signed, Georgeanna Lea, MD, [facility] 05/24/2023 2:43 PM    [facility]

## 2023-05-25 ENCOUNTER — Ambulatory Visit: Payer: Medicare (Managed Care) | Admitting: Family Medicine

## 2023-05-26 ENCOUNTER — Ambulatory Visit (INDEPENDENT_AMBULATORY_CARE_PROVIDER_SITE_OTHER): Payer: Medicare (Managed Care) | Admitting: Behavioral Health

## 2023-05-26 ENCOUNTER — Encounter: Payer: Self-pay | Admitting: Behavioral Health

## 2023-05-26 ENCOUNTER — Encounter: Payer: Self-pay | Admitting: Family Medicine

## 2023-05-26 DIAGNOSIS — F411 Generalized anxiety disorder: Secondary | ICD-10-CM

## 2023-05-26 NOTE — Progress Notes (Signed)
[facility] Counselor/Therapist Progress Note  Patient ID: Paula Stout, MRN: [id number],    Date: 05/26/2023  Time Spent: 50 minutes, 2:00 PM until 2:50 PM spent with the patient.This session was held via video teletherapy. The patient consented to the video teletherapy and was located in her home during this session. She is aware it is the responsibility of the patient to secure confidentiality on her end of the session. The provider was in a private home  for the duration of this session.      Treatment Type: Individual Therapy  Reported Symptoms: Anxiety, depression  Mental Status Exam: Appearance:  Casual     Behavior: Appropriate  Motor: Normal  Speech/Language:  Normal Rate  Affect: Appropriate  Mood: normal  Thought process: normal  Thought content:   WNL  Sensory/Perceptual disturbances:   WNL  Orientation: oriented to person, place, time/date, situation, day of week, month of year, and year  Attention: Good  Concentration: Good  Memory: WNL  Fund of knowledge:  Good  Insight:   Good  Judgment:  Good  Impulse Control: Good   Risk Assessment: Danger to Self:  No Self-injurious Behavior: No Danger to Others: No Duty to Warn:no Physical Aggression / Violence:No  Access to Firearms a concern: No  Gang Involvement:No   Subjective: It has been a rough week for the patient.  She is still having significant chest pains and migraines.  She went to the emergency room a week ago today and did a CAT scan.  They did find some fluid on the bottom of her heart but said that was fairly normal based on the recent ablation she had.  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  Her PCP's office called her the next morning and the nodule on her lung is an inch and they are concerned about it thinking that it is cancerous.  The first appointment available with the pulmonologist is on October 1.   She is still having chest pains and she is wearing a heart monitor for the next 2 weeks and then will meet with her cardiologist again.  She is doing her best to distract herself but does report an increase in anxiety.  She is spoken to most of her adult children about it and they are very positive and encouraging.  She is doing a good job of staying as positive as can but does acknowledge some anxiety.  We talked about the importance of using coping skills for relaxation and also to continue to challenge those anxious thoughts. She does contract for safety having no thoughts of hurting herself or anyone else.  I will meet with her next week. Interventions: Cognitive Behavioral Therapy  Diagnosis: Generalized anxiety disorder, major depressive disorder, recurrent, moderate.  Plan: I will meet with the patient weekly via care agility.  Treatment plan: We will use cognitive behavioral therapy as well as person centered and supportive therapy in addition to elements of dialectical behavior therapy to help reduce the patient's anxiety and depression by at least 50% with a target date of October 06, 2023.  Goals for improving depression may include having less sadness as indicated by patient report and scores on the PHQ-9, have improved mood and return to a healthier level of functioning, identify causes including environment for depressed mood and learn ways to cope with depression especially those connected to her medical  issues.  Interventions include using cognitive behavioral therapy to explore and replace thoughts and behaviors.  We will look at how depression is experienced in day-to-day living and encouraged sharing of feelings.  We will encourage the use of coping skills for management of depressive symptoms.  Goals for reducing anxiety are to improve her ability to manage anxiety symptoms, better handle stress, identify causes for anxiety and explore ways to lower it, resolve the core conflicts  contributing to anxiety as well as manage thoughts and worrisome thinking contributing to feelings of anxiety.  Interventions will include providing education about anxiety, facilitate problem solution skills to help her identify options for resolving stress, teach coping skills for managing anxiety as well as mindfulness and communication in terms of family to reduce her stress level, use cognitive behavior therapy to identify and change anxiety provoking thought and behavior patterns as well as teach distress tolerance and mindfulness skills for alleviating anxiety. Progress: 30%  Paula Stout, [facility]                  Paula Stout, [facility]               Paula Stout, [facility]               Paula Stout, [facility]               Paula Stout, [facility]               Paula Stout, [facility]

## 2023-05-29 ENCOUNTER — Other Ambulatory Visit: Payer: Self-pay | Admitting: Family Medicine

## 2023-05-29 DIAGNOSIS — E042 Nontoxic multinodular goiter: Secondary | ICD-10-CM

## 2023-05-31 ENCOUNTER — Ambulatory Visit: Payer: Medicare (Managed Care) | Admitting: Family Medicine

## 2023-05-31 ENCOUNTER — Encounter: Payer: Self-pay | Admitting: Family Medicine

## 2023-05-31 ENCOUNTER — Other Ambulatory Visit: Payer: Self-pay | Admitting: Family Medicine

## 2023-05-31 VITALS — BP 138/88 | HR 96 | Temp 98.1°F | Ht 66.0 in | Wt 214.5 lb

## 2023-05-31 DIAGNOSIS — F319 Bipolar disorder, unspecified: Secondary | ICD-10-CM | POA: Diagnosis not present

## 2023-05-31 DIAGNOSIS — E1165 Type 2 diabetes mellitus with hyperglycemia: Secondary | ICD-10-CM

## 2023-05-31 DIAGNOSIS — Z7985 Long-term (current) use of injectable non-insulin antidiabetic drugs: Secondary | ICD-10-CM

## 2023-05-31 DIAGNOSIS — E782 Mixed hyperlipidemia: Secondary | ICD-10-CM | POA: Diagnosis not present

## 2023-05-31 DIAGNOSIS — Z7984 Long term (current) use of oral hypoglycemic drugs: Secondary | ICD-10-CM

## 2023-05-31 LAB — COMPREHENSIVE METABOLIC PANEL
ALT: 11 U/L (ref 0–35)
AST: 14 U/L (ref 0–37)
Albumin: 4 g/dL (ref 3.5–5.2)
Alkaline Phosphatase: 67 U/L (ref 39–117)
BUN: 18 mg/dL (ref 6–23)
CO2: 24 mEq/L (ref 19–32)
Calcium: 9.9 mg/dL (ref 8.4–10.5)
Chloride: 108 mEq/L (ref 96–112)
Creatinine, Ser: 2.18 mg/dL — ABNORMAL HIGH (ref 0.40–1.20)
GFR: 23.18 mL/min — ABNORMAL LOW (ref >60.00)
Glucose, Bld: 153 mg/dL — ABNORMAL HIGH (ref 70–99)
Potassium: 4.4 mEq/L (ref 3.5–5.1)
Sodium: 141 mEq/L (ref 135–145)
Total Bilirubin: 0.5 mg/dL (ref 0.2–1.2)
Total Protein: 7 g/dL (ref 6.0–8.3)

## 2023-05-31 LAB — LIPID PANEL
Cholesterol: 127 mg/dL (ref 0–200)
HDL: 34.3 mg/dL — ABNORMAL LOW (ref >39.00)
LDL Cholesterol: 58 mg/dL (ref 0–99)
NonHDL: 92.95
Total CHOL/HDL Ratio: 4
Triglycerides: 176 mg/dL — ABNORMAL HIGH (ref 0.0–149.0)
VLDL: 35.2 mg/dL (ref 0.0–40.0)

## 2023-05-31 LAB — HEMOGLOBIN A1C: Hgb A1c MFr Bld: 6.4 % (ref 4.6–6.5)

## 2023-05-31 MED ORDER — TIRZEPATIDE 7.5 MG/0.5ML ~~LOC~~ SOAJ: 7.5000 mg | SUBCUTANEOUS | 0 refills | Status: AC

## 2023-05-31 MED ORDER — TIRZEPATIDE 10 MG/0.5ML ~~LOC~~ SOAJ: 10.0000 mg | SUBCUTANEOUS | 0 refills | Status: AC

## 2023-05-31 MED ORDER — TIRZEPATIDE 12.5 MG/0.5ML ~~LOC~~ SOAJ: 12.5000 mg | SUBCUTANEOUS | 0 refills | Status: DC

## 2023-05-31 NOTE — Patient Instructions (Signed)
Give [place] 2-3 business days to get the results of your labs back.   Keep the diet clean and stay active.  Aim to do some physical exertion for 150 minutes per week. This is typically divided into 5 days per week, 30 minutes per day. The activity should be enough to get your heart rate up. Anything is better than nothing if you have time constraints.  Let us know if you need anything.

## 2023-05-31 NOTE — Progress Notes (Signed)
Subjective:   Chief Complaint  Patient presents with   Follow-up    3 month    Paula Stout is a 67 y.o. female here for follow-up of diabetes.   Paula Stout's self monitored glucose range is low 100's.  Patient denies hypoglycemic reactions. She checks her glucose levels 1 time(s) per day. Patient does not require insulin.   Medications include: metformin XR 500 mg bid, Trulicity 1.5 mg/week, Farxiga 10  mg/d.  Diet is OK.  Exercise: none  Dyslipidemia Patient presents for dyslipidemia follow up. Currently being treated with Lipitor 80 mg/d, Zetia 10 mg/d, fenofibrate 160 mg/d; and compliance with treatment thus far has been good. She denies myalgias. Diet/exercise as above.  The patient is known to have coexisting coronary artery disease.  Mood Disorder History of possible bipolar disorder.  She currently takes Viibryd 40 mg daily, quetiapine 300 mg nightly, Wellbutrin XL 300 mg daily.  She reports compliance no adverse effects.  No homicidal or suicidal ideation.  No self-medication.  Past Medical History:  Diagnosis Date   ACS (acute coronary syndrome) (HCC) 07/31/2021   Adhesive capsulitis of left shoulder 12/16/2016   Asthma without status asthmaticus 02/06/2021   At risk for falls 02/04/2019   Atrial fibrillation (HCC) 09/01/2014   Last Assessment & Plan:  Formatting of this note might be different from the original. A:  Chronic.  Sinus rhythm at this time.  States she was told this during admission at [facility] in the past. P:  On baby aspirin at home will continue.  Low dose metoprolol started.   Bipolar 1 disorder, depressed, moderate (HCC) 02/18/2020   Capsulitis of left shoulder 10/20/2021   Chronic anticoagulation 02/20/2020   Chronic atrial fibrillation (HCC) 02/18/2020   Chronic headache 10/11/2013   Chronic interstitial cystitis 12/28/2005   Chronic migraine without aura, intractable, without status migrainosus 06/24/2017   Colon cancer screening 04/15/2016    Last Assessment & Plan:  Formatting of this note might be different from the original. Will schedule for colonoscopy   Depression, recurrent (HCC) 02/18/2020   Diabetes mellitus (HCC)    Dyslipidemia 02/20/2020   GAD (generalized anxiety disorder) 02/18/2020   GERD (gastroesophageal reflux disease) 11/05/2021   History of atrial fibrillation 06/24/2017   History of posttraumatic stress disorder (PTSD) 10/07/2016   Hyperosmolarity due to secondary diabetes mellitus (HCC) 02/06/2021   Impaired mobility and activities of daily living 11/14/2015   Irritable bowel syndrome (IBS)    Junctional tachycardia 11/05/2021   Kidney stone 02/06/2021   Migraine without aura, not refractory 02/06/2021   Mixed hyperlipidemia 04/06/2021   Last Assessment & Plan:  Formatting of this note might be different from the original. 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 Controlled   Refractory migraine with aura 02/06/2021   Refractory migraine without aura 02/06/2021   Renal colic on left side 04/22/2017   Stage 3 chronic kidney disease (HCC) 02/20/2020   Unspecified adverse effect of other drug, medicinal and biological substance(995.29) 02/17/2006   Urinary tract obstruction 02/06/2021     Related testing: Retinal exam: Done Pneumovax: done  Objective:  BP 138/88 (BP Location: Left Arm, Patient Position: Sitting, Cuff Size: Normal)   Pulse 96  Temp 98.1 F (36.7 C) (Oral)   Ht 5\' 6"  (1.676 m)   Wt 214 lb 8 oz (97.3 kg)   SpO2 97%   BMI 34.62 kg/m  General:  Well developed, well nourished, in no apparent distress Lungs:  CTAB, no access msc use Cardio:  RRR, no bruits, no LE  edema Psych: Age appropriate judgment and insight  Assessment:   Type 2 diabetes mellitus with hyperglycemia, without long-term current use of insulin (HCC) - Plan: Comprehensive metabolic panel, Lipid panel, Hemoglobin A1c  Mixed hyperlipidemia  Depressed bipolar I disorder (HCC)   Plan:   Chronic, hopefully stable.  For now continue Trulicity 1.5 mg weekly, metformin 500 mg twice daily, Farxiga 10 mg daily.  She will contact her insurance company to find out if Greggory Keen is covered to help with weight loss.  Counseled on diet and exercise. Chronic, stable. Cont Zetia 10 mg/d, Lipitor 80 mg/d, fenofibrate 160 mg/d.  Chronic, stable. Cont Seroquel 300 mg qhs, Viibryd 40 mg/d,  Wellbutrin XL 300 mg/d  F/u in 6 mo. The patient voiced understanding and agreement to the plan.  Jilda Roche [place], DO 05/31/23 11:05 AM

## 2023-06-01 ENCOUNTER — Encounter (HOSPITAL_BASED_OUTPATIENT_CLINIC_OR_DEPARTMENT_OTHER): Payer: Self-pay

## 2023-06-01 ENCOUNTER — Emergency Department (HOSPITAL_BASED_OUTPATIENT_CLINIC_OR_DEPARTMENT_OTHER): Payer: Medicare (Managed Care)

## 2023-06-01 ENCOUNTER — Other Ambulatory Visit: Payer: Self-pay

## 2023-06-01 ENCOUNTER — Emergency Department (HOSPITAL_BASED_OUTPATIENT_CLINIC_OR_DEPARTMENT_OTHER)
Admission: EM | Admit: 2023-06-01 | Discharge: 2023-06-01 | Disposition: A | Discharge status: Home / Self Care | Payer: Medicare (Managed Care) | Attending: Emergency Medicine | Admitting: Emergency Medicine

## 2023-06-01 DIAGNOSIS — R519 Headache, unspecified: Secondary | ICD-10-CM | POA: Diagnosis not present

## 2023-06-01 DIAGNOSIS — R9431 Abnormal electrocardiogram [ECG] [EKG]: Secondary | ICD-10-CM | POA: Diagnosis not present

## 2023-06-01 DIAGNOSIS — Z7984 Long term (current) use of oral hypoglycemic drugs: Secondary | ICD-10-CM | POA: Diagnosis not present

## 2023-06-01 DIAGNOSIS — R079 Chest pain, unspecified: Secondary | ICD-10-CM | POA: Diagnosis not present

## 2023-06-01 DIAGNOSIS — N3000 Acute cystitis without hematuria: Secondary | ICD-10-CM | POA: Insufficient documentation

## 2023-06-01 DIAGNOSIS — E119 Type 2 diabetes mellitus without complications: Secondary | ICD-10-CM | POA: Diagnosis not present

## 2023-06-01 DIAGNOSIS — Z7901 Long term (current) use of anticoagulants: Secondary | ICD-10-CM | POA: Diagnosis not present

## 2023-06-01 LAB — COMPREHENSIVE METABOLIC PANEL
ALT: 14 U/L (ref 0–44)
AST: 20 U/L (ref 15–41)
Albumin: 3.5 g/dL (ref 3.5–5.0)
Alkaline Phosphatase: 51 U/L (ref 38–126)
Anion gap: 5 (ref 5–15)
BUN: 17 mg/dL (ref 8–23)
CO2: 22 mmol/L (ref 22–32)
Calcium: 9.1 mg/dL (ref 8.9–10.3)
Chloride: 113 mmol/L — ABNORMAL HIGH (ref 98–111)
Creatinine, Ser: 1.88 mg/dL — ABNORMAL HIGH (ref 0.44–1.00)
GFR, Estimated: 29 mL/min — ABNORMAL LOW (ref >60)
Glucose, Bld: 133 mg/dL — ABNORMAL HIGH (ref 70–99)
Potassium: 4.1 mmol/L (ref 3.5–5.1)
Sodium: 140 mmol/L (ref 135–145)
Total Bilirubin: 0.5 mg/dL (ref 0.3–1.2)
Total Protein: 7 g/dL (ref 6.5–8.1)

## 2023-06-01 LAB — URINALYSIS, W/ REFLEX TO CULTURE (INFECTION SUSPECTED)
Bilirubin Urine: NEGATIVE
Glucose, UA: >=500 mg/dL — AB
Hgb urine dipstick: NEGATIVE
Ketones, ur: NEGATIVE mg/dL
Leukocytes,Ua: NEGATIVE
Nitrite: POSITIVE — AB
Protein, ur: NEGATIVE mg/dL
Specific Gravity, Urine: 1.025 (ref 1.005–1.030)
pH: 5.5 (ref 5.0–8.0)

## 2023-06-01 LAB — CBC WITH DIFFERENTIAL/PLATELET
Abs Immature Granulocytes: 0.01 10*3/uL (ref 0.00–0.07)
Basophils Absolute: 0.1 10*3/uL (ref 0.0–0.1)
Basophils Relative: 1 %
Eosinophils Absolute: 0.3 10*3/uL (ref 0.0–0.5)
Eosinophils Relative: 5 %
HCT: 35.1 % — ABNORMAL LOW (ref 36.0–46.0)
Hemoglobin: 11.3 g/dL — ABNORMAL LOW (ref 12.0–15.0)
Immature Granulocytes: 0 %
Lymphocytes Relative: 31 %
Lymphs Abs: 2.1 10*3/uL (ref 0.7–4.0)
MCH: 30.6 pg (ref 26.0–34.0)
MCHC: 32.2 g/dL (ref 30.0–36.0)
MCV: 95.1 fL (ref 80.0–100.0)
Monocytes Absolute: 0.6 10*3/uL (ref 0.1–1.0)
Monocytes Relative: 9 %
Neutro Abs: 3.7 10*3/uL (ref 1.7–7.7)
Neutrophils Relative %: 54 %
Platelets: 264 10*3/uL (ref 150–400)
RBC: 3.69 MIL/uL — ABNORMAL LOW (ref 3.87–5.11)
RDW: 16.7 % — ABNORMAL HIGH (ref 11.5–15.5)
WBC: 6.8 10*3/uL (ref 4.0–10.5)
nRBC: 0 % (ref 0.0–0.2)

## 2023-06-01 LAB — TROPONIN I (HIGH SENSITIVITY): Troponin I (High Sensitivity): 5 ng/L (ref <18)

## 2023-06-01 MED ORDER — SODIUM CHLORIDE 0.9 % IV BOLUS
1000.0000 mL | Freq: Once | INTRAVENOUS | Status: AC
  Administered 2023-06-01: 1000 mL via INTRAVENOUS

## 2023-06-01 MED ORDER — DEXAMETHASONE SODIUM PHOSPHATE 10 MG/ML IJ SOLN
10.0000 mg | Freq: Once | INTRAMUSCULAR | Status: AC
  Administered 2023-06-01: 10 mg via INTRAVENOUS
  Filled 2023-06-01: qty 1

## 2023-06-01 MED ORDER — CEPHALEXIN 500 MG PO CAPS
500.0000 mg | ORAL_CAPSULE | Freq: Three times a day (TID) | ORAL | 0 refills | Status: AC
Start: 2023-06-01 — End: 2023-06-08

## 2023-06-01 MED ORDER — MAGNESIUM SULFATE 2 GM/50ML IV SOLN
2.0000 g | Freq: Once | INTRAVENOUS | Status: AC
  Administered 2023-06-01: 2 g via INTRAVENOUS
  Filled 2023-06-01: qty 50

## 2023-06-01 MED ORDER — HYDROMORPHONE HCL 1 MG/ML IJ SOLN
1.0000 mg | Freq: Once | INTRAMUSCULAR | Status: AC
  Administered 2023-06-01: 1 mg via INTRAVENOUS
  Filled 2023-06-01: qty 1

## 2023-06-01 MED ORDER — PROCHLORPERAZINE EDISYLATE 10 MG/2ML IJ SOLN
10.0000 mg | Freq: Once | INTRAMUSCULAR | Status: AC
  Administered 2023-06-01: 10 mg via INTRAVENOUS
  Filled 2023-06-01: qty 2

## 2023-06-01 MED ORDER — HYDROMORPHONE HCL 1 MG/ML IJ SOLN
0.5000 mg | Freq: Once | INTRAMUSCULAR | Status: AC
  Administered 2023-06-01: 0.5 mg via INTRAVENOUS
  Filled 2023-06-01: qty 1

## 2023-06-01 MED ORDER — DIPHENHYDRAMINE HCL 50 MG/ML IJ SOLN
12.5000 mg | Freq: Once | INTRAMUSCULAR | Status: AC
  Administered 2023-06-01: 12.5 mg via INTRAVENOUS
  Filled 2023-06-01: qty 1

## 2023-06-01 NOTE — Discharge Instructions (Addendum)
It was a pleasure taking care of you here in the emergency department  Your headache improved while in the emergency department  Your urine did show urinary tract infection.  We have sent for culture.  I started on antibiotics.  Take as prescribed.  Make sure to follow-up with your neurologist for your migraines  Return for new or worsening symptoms

## 2023-06-01 NOTE — ED Triage Notes (Signed)
Pt reports that she has had a Migraine x 2 days and the meds are not working

## 2023-06-01 NOTE — ED Provider Notes (Signed)
[facility] Provider Note   CSN: [id number] Arrival date & time: 06/01/23  1514    History  Chief Complaint  Patient presents with   Migraine    Paula Stout is a 67 y.o. female history of diabetes, migraine, bipolar, A-fib, unstable angina, SVT here for evaluation of headache.  Patient states she has recurrent migraines, unrelieved with her home meds.  Patient states she has a "migraine cocktail" that she typically gets that helps with her pain.  This includes Dilaudid, Compazine, Benadryl, Decadron.  Patient states that she also has chest pain.  States she typically gets chest pain and palpitations when she has a migraine.  This is not uncommon for her.  She is compliant with her anticoagulation.  States she is currently wearing a Zio patch for her palpitations.  No pain or swelling to lower extremities.  No shortness of breath, fever, headache, abdominal pain, dysuria, hematuria.  No slurred speech, visual field cuts, unilateral weakness, numbness.  Denies any recent trauma or injury  HPI     Home Medications Prior to Admission medications   Medication Sig Start Date End Date Taking? Authorizing Provider  cephALEXin (KEFLEX) 500 MG capsule Take 1 capsule (500 mg total) by mouth 3 (three) times daily for 7 days. 06/01/23 06/08/23 Yes Para Cossey A, PA-C  acetaminophen (TYLENOL) 500 MG tablet Take 1,000 mg by mouth every 6 (six) hours as needed for mild pain.    [provider]  albuterol (VENTOLIN HFA) 108 (90 Base) MCG/ACT inhaler Inhale 2 puffs into the lungs every 4 (four) hours as needed for wheezing. 06/23/20   Sharlene Dory, DO  Ascorbic Acid (VITAMIN C PO) Take 1 tablet by mouth daily.    [provider]  atorvastatin (LIPITOR) 80 MG tablet TAKE 1 TABLET DAILY 02/09/23   Sharlene Dory, DO  blood glucose meter kit and supplies One Touch Ultra  Check blood sugars once daily Dx: E11.9 Patient  taking differently: 1 each by Other route See admin instructions. One Touch Ultra  Check blood sugars once daily Dx: E11.9 10/01/21   Sharlene Dory, DO  botulinum toxin Type A (BOTOX) 200 units injection Inject 155 units IM into multiple site in the face,neck and head once every 90 days Patient taking differently: Inject 155 Units into the muscle See admin instructions. Inject 155 units IM into multiple site in the face,neck and head once every 90 days 06/15/22   Drema Dallas, DO  buPROPion (WELLBUTRIN XL) 300 MG 24 hr tablet TAKE 1 TABLET DAILY 10/29/22   Sharlene Dory, DO  Cholecalciferol (VITAMIN D3) 125 MCG (5000 UT) capsule Take 5,000 Units by mouth daily.    [provider]  Cyanocobalamin (VITAMIN B-12) 5000 MCG TBDP Take 5,000 mcg by mouth daily.    [provider]  dapagliflozin propanediol (FARXIGA) 10 MG TABS tablet Take 1 tablet (10 mg total) by mouth daily. 03/23/23   Sharlene Dory, DO  diltiazem (CARDIZEM) 30 MG tablet Take 1 tablet every 4 hours AS NEEDED for AFIB or SVT heart rate >95 as long as top BP >100. Patient taking differently: Take 30 mg by mouth See admin instructions. Take 1 tablet every 4 hours AS NEEDED for AFIB or SVT heart rate >95 as long as top BP >100. 12/03/22   Sherie Don, NP  ELIQUIS 5 MG TABS tablet TAKE 1 TABLET TWICE A DAY 02/02/23   Sharlene Dory, DO  EPINEPHrine  0.3 mg/0.3 mL IJ SOAJ injection Inject 0.3 mg into the muscle as needed for anaphylaxis. 11/17/20   Sharlene Dory, DO  ezetimibe (ZETIA) 10 MG tablet TAKE 1 TABLET DAILY 02/03/23   Sharlene Dory, DO  fenofibrate 160 MG tablet Take 1 tablet (160 mg total) by mouth daily. 11/23/22   Sharlene Dory, DO  fluticasone (FLONASE) 50 MCG/ACT nasal spray Place 2 sprays into both nostrils daily. Patient taking differently: Place 2 sprays into both nostrils 3 (three) times a week. 01/29/23   Junie Spencer, FNP  levocetirizine  (XYZAL) 5 MG tablet TAKE 1 TABLET EVERY EVENING Patient taking differently: Take 5 mg by mouth every evening. 01/14/23   Sharlene Dory, DO  Lurasidone HCl 120 MG TABS Take 1 tablet (120 mg total) by mouth at bedtime. 08/03/22   Sharlene Dory, DO  magnesium oxide (MAG-OX) 400 (240 Mg) MG tablet Take 400 mg by mouth daily.    [provider]  metFORMIN (GLUCOPHAGE-XR) 500 MG 24 hr tablet Take 1 tablet (500 mg total) by mouth 2 (two) times daily with a meal. 04/20/23   Wendling, Jilda Roche, DO  metoprolol succinate (TOPROL-XL) 100 MG 24 hr tablet Take 1 tablet (100 mg total) by mouth 2 (two) times daily. Take with or immediately following a meal. 04/12/23   Camnitz, Andree Coss, MD  metoprolol tartrate (LOPRESSOR) 25 MG tablet Take 1 tablet (25 mg total) by mouth every 4 (four) hours as needed (PALPITATIONS). 08/24/22   Camnitz, Andree Coss, MD  Multiple Vitamins-Minerals (MULTIVITAMIN WITH MINERALS) tablet Take 1 tablet by mouth daily.    [provider]  nitroGLYCERIN (NITROSTAT) 0.4 MG SL tablet Place 1 tablet (0.4 mg total) under the tongue every 5 (five) minutes as needed for chest pain. 03/16/23   Georgeanna Lea, MD  ondansetron (ZOFRAN-ODT) 4 MG disintegrating tablet Take 1 tablet (4 mg total) by mouth every 8 (eight) hours as needed for nausea or vomiting. 01/27/23   Carmelia Roller, Jilda Roche, DO  ONETOUCH VERIO test strip USE DAILY TO CHECK BLOOD SUGAR Patient taking differently: 1 each by Other route as needed for other (see below). Use daily to check blood sugar. 10/29/22   Sharlene Dory, DO  pantoprazole (PROTONIX) 40 MG tablet Take 1 tablet (40 mg total) by mouth daily. 11/22/22   Sharlene Dory, DO  QUEtiapine (SEROQUEL) 300 MG tablet Take 1 tablet (300 mg total) by mouth at bedtime. 08/03/22   Sharlene Dory, DO  Rimegepant Sulfate (NURTEC) 75 MG TBDP Take 1 tablet (75 mg total) by mouth as needed (take 1 tab at the earlist  onset of a migraine. Max 1 tab in 24 hours). 04/04/23   Drema Dallas, DO  tirzepatide [facility]) 10 MG/0.5ML Pen Inject 10 mg into the skin once a week for 28 days. 06/28/23 07/26/23  Sharlene Dory, DO  tirzepatide [facility]) 12.5 MG/0.5ML Pen Inject 12.5 mg into the skin once a week. 07/26/23   Sharlene Dory, DO  tirzepatide [facility]) 7.5 MG/0.5ML Pen Inject 7.5 mg into the skin once a week for 28 days. 05/31/23 06/28/23  Sharlene Dory, DO  topiramate (TOPAMAX) 50 MG tablet TAKE 3 TABLETS TWICE A DAY Patient taking differently: Take 150 mg by mouth 2 (two) times daily. 01/14/23   Sharlene Dory, DO  Vilazodone HCl (VIIBRYD) 40 MG TABS Take 1 tablet (40 mg total) by mouth daily. 04/06/23   Joan Flores, NP  Allergies    Bee venom, Onion, Tetanus-diphtheria toxoids td, Sumatriptan, Zolpidem, Asa [aspirin], Iodine, Nsaids, Sulfa antibiotics, and Vancomycin    Review of Systems   Review of Systems  Constitutional: Negative.   HENT: Negative.    Respiratory: Negative.    Cardiovascular:  Positive for chest pain and palpitations. Negative for leg swelling.  Gastrointestinal:  Positive for nausea and vomiting.  Genitourinary: Negative.   Musculoskeletal: Negative.   Skin: Negative.   Neurological:  Positive for headaches. Negative for dizziness, tremors, seizures, syncope, facial asymmetry, speech difficulty, weakness, light-headedness and numbness.  All other systems reviewed and are negative.   Physical Exam Updated Vital Signs BP 115/62 (BP Location: Right Arm)   Pulse 88   Temp 97.6 F (36.4 C) (Oral)   Resp 15   SpO2 95%  Physical Exam Physical Exam  Constitutional: Pt is oriented to person, place, and time. Pt appears well-developed and well-nourished. No distress.  HENT:  Head: Normocephalic and atraumatic.  Mouth/Throat: Oropharynx is clear and moist.  Eyes: Conjunctivae and EOM are normal. Pupils are equal, round, and reactive to  light. No scleral icterus.  No horizontal, vertical or rotational nystagmus  Neck: Normal range of motion. Neck supple.  Full active and passive ROM without pain No midline or paraspinal tenderness No nuchal rigidity or meningeal signs  Cardiovascular: Normal rate, regular rhythm and intact distal pulses.   Pulmonary/Chest: Effort normal and breath sounds normal. No respiratory distress. Pt has no wheezes. No rales.  Abdominal: Soft. Bowel sounds are normal. There is no tenderness. There is no rebound and no guarding.  Musculoskeletal: Normal range of motion.  Lymphadenopathy:    No cervical adenopathy.  Neurological: Pt. is alert and oriented to person, place, and time. He has normal reflexes. No cranial nerve deficit.  Exhibits normal muscle tone. Coordination normal.  Mental Status:  Alert, oriented, thought content appropriate. Speech fluent without evidence of aphasia. Able to follow 2 step commands without difficulty.  Cranial Nerves:  2-12 grossly intact Motor:  Equal strength BIL Sensory: intact sensation Cerebellar: normalF2N Gait: normal gait  CV: distal pulses palpable throughout   Skin: Skin is warm and dry. No rash noted. Pt is not diaphoretic.  Psychiatric: Pt has a normal mood and affect. Behavior is normal. Judgment and thought content normal.  Nursing note and vitals reviewed.  ED Results / Procedures / Treatments   Labs (all labs ordered are listed, but only abnormal results are displayed) Labs Reviewed  CBC WITH DIFFERENTIAL/PLATELET - Abnormal; Notable for the following components:      Result Value   RBC 3.69 (*)    Hemoglobin 11.3 (*)    HCT 35.1 (*)    RDW 16.7 (*)    All other components within normal limits  COMPREHENSIVE METABOLIC PANEL - Abnormal; Notable for the following components:   Chloride 113 (*)    Glucose, Bld 133 (*)    Creatinine, Ser 1.88 (*)    GFR, Estimated 29 (*)    All other components within normal limits  URINALYSIS, W/ REFLEX  TO CULTURE (INFECTION SUSPECTED) - Abnormal; Notable for the following components:   APPearance CLOUDY (*)    Glucose, UA >=500 (*)    Nitrite POSITIVE (*)    Bacteria, UA MANY (*)    All other components within normal limits  TROPONIN I (HIGH SENSITIVITY)    EKG None  Radiology DG Chest 2 View  Result Date: 06/01/2023 CLINICAL DATA:  Chest pain EXAM: CHEST - 2  VIEW COMPARISON:  None Available. FINDINGS: The heart size and mediastinal contours are within normal limits. Both lungs are clear. The visualized skeletal structures are unremarkable. IMPRESSION: No active cardiopulmonary disease. Electronically Signed   By: Helyn Numbers M.D.   On: 06/01/2023 19:36    Procedures Procedures    Medications Ordered in ED Medications  HYDROmorphone (DILAUDID) injection 0.5 mg (0.5 mg Intravenous Given 06/01/23 1752)  prochlorperazine (COMPAZINE) injection 10 mg (10 mg Intravenous Given 06/01/23 1752)  diphenhydrAMINE (BENADRYL) injection 12.5 mg (12.5 mg Intravenous Given 06/01/23 1752)  dexamethasone (DECADRON) injection 10 mg (10 mg Intravenous Given 06/01/23 1752)  sodium chloride 0.9 % bolus 1,000 mL (0 mLs Intravenous Stopped 06/01/23 1916)  HYDROmorphone (DILAUDID) injection 1 mg (1 mg Intravenous Given 06/01/23 1943)  magnesium sulfate IVPB 2 g 50 mL (0 g Intravenous Stopped 06/01/23 2017)   ED Course/ Medical Decision Making/ A&P   67 year old multiple medical problems here for evaluation of headache.  Unrelieved with home medications.  She shows me a "migraine cocktail list" medications that typically work for her.  She also states she has palpitations chest pain as well as nausea vomiting she gets her migraines.  She states she denies a Zio patch on.  Migraine feels typical of her recurrent migraine. No "sudden onset" headache.  She has a nonfocal neuroexam without deficits.  Her heart and lungs are clear.  Abdomen soft, tender.  Her multiple episodes of emesis will check urine,  electrolytes, pain.  Labs imaging personally viewed and interpreted:  CBC without leukocytosis, hemoglobin 11.3 similar to prior Metabolic panel creatinine 1.88, similar to prior Troponin delta flat UA positive for UTI--- denies UTI symptoms however given positive nitrite, many bacteria will start antibiotics, pending culture  Patient reassessed.  Headache improved however still has headache.  Requesting additional dose of pain management.  Patient reassessed after additional magnesium.  Headache improved.  She continues have a nonfocal neuroexam without deficits.  Tolerating p.o. intake.  Will have her follow-up outpatient, return for any worsening symptoms.  Low suspicion for acute CVA, dissection, SAH, IIH, acute angle glaucoma, dural venous sinus thrombosis, traumatic injury as cause of her headache.  With regard to her chest pain she states this feels typical when she gets her headaches.  Low suspicion for acute ACS, PE, dissection, pneumonia, pneumothorax, CHF exacerbation, arrhythmia, unstable angina.  Will have her follow-up outpatient  The patient has been appropriately medically screened and/or stabilized in the ED. I have low suspicion for any other emergent medical condition which would require further screening, evaluation or treatment in the ED or require inpatient management.  Patient is hemodynamically stable and in no acute distress.  Patient able to ambulate in department prior to ED.  Evaluation does not show acute pathology that would require ongoing or additional emergent interventions while in the emergency department or further inpatient treatment.  I have discussed the diagnosis with the patient and answered all questions.  Pain is been managed while in the emergency department and patient has no further complaints prior to discharge.  Patient is comfortable with plan discussed in room and is stable for discharge at this time.  I have discussed strict return precautions for  returning to the emergency department.  Patient was encouraged to follow-up with PCP/specialist refer to at discharge.                                  Medical  Decision Making Amount and/or Complexity of Data Reviewed Independent Historian: friend External Data Reviewed: labs, radiology, ECG and notes. Labs: ordered. Decision-making details documented in ED Course. Radiology: ordered and independent interpretation performed. Decision-making details documented in ED Course. ECG/medicine tests: ordered and independent interpretation performed. Decision-making details documented in ED Course.  Risk OTC drugs. Prescription drug management. Parenteral controlled substances. Decision regarding hospitalization. Diagnosis or treatment significantly limited by social determinants of health.           Final Clinical Impression(s) / ED Diagnoses Final diagnoses:  Acute nonintractable headache, unspecified headache type  Acute cystitis without hematuria    Rx / DC Orders ED Discharge Orders          Ordered    cephALEXin (KEFLEX) 500 MG capsule  3 times daily        06/01/23 2137              Mavis Fichera A, PA-C 06/01/23 2154    Loetta Rough, MD 06/01/23 2206

## 2023-06-02 ENCOUNTER — Ambulatory Visit: Payer: Medicare (Managed Care) | Admitting: Behavioral Health

## 2023-06-02 ENCOUNTER — Encounter: Payer: Self-pay | Admitting: Behavioral Health

## 2023-06-02 DIAGNOSIS — F331 Major depressive disorder, recurrent, moderate: Secondary | ICD-10-CM

## 2023-06-02 DIAGNOSIS — F411 Generalized anxiety disorder: Secondary | ICD-10-CM | POA: Diagnosis not present

## 2023-06-02 NOTE — Progress Notes (Signed)
Behavioral Health Counselor/Therapist Progress Note  Patient ID: Adalee Abdo, MRN: [id number],    Date: 06/02/2023  Time Spent: 56 minutes, 2:00 PM until 2:56 PM spent with the patient.This session was held via video teletherapy. The patient consented to the video teletherapy and was located in her home during this session. She is aware it is the responsibility of the patient to secure confidentiality on her end of the session. The provider was in a private home  for the duration of this session.      Treatment Type: Individual Therapy  Reported Symptoms: Anxiety, depression  Mental Status Exam: Appearance:  Casual     Behavior: Appropriate  Motor: Normal  Speech/Language:  Normal Rate  Affect: Appropriate  Mood: normal  Thought process: normal  Thought content:   WNL  Sensory/Perceptual disturbances:   WNL  Orientation: oriented to person, place, time/date, situation, day of week, month of year, and year  Attention: Good  Concentration: Good  Memory: WNL  Fund of knowledge:  Good  Insight:   Good  Judgment:  Good  Impulse Control: Good   Risk Assessment: Danger to Self:  No Self-injurious Behavior: No Danger to Others: No Duty to Warn:no Physical Aggression / Violence:No  Access to Firearms a concern: No  Gang Involvement:No   Subjective: The patient had another migraine and had to go to the hospital.  It took to rounds of treatment for the migraine to start backing off.  In the midst of being treated he did other test and found that she had a UTI and now she is being treated for that.  She got the results of the scan from her lymph nodes and there were 4 nodules 2 of which they expressed concern about.  She is meeting with a doctor next week about that and then may take 2 biopsies.  She denies seeing a pulmonologist for another couple of weeks.  She reports an increase in panic attacks saying a lot of noise which happens a lot downstairs in her  house is triggering for her.  She is trying to balance that with not staying in her room all the time but does not have a lot of energy and gets tired very easily moving around.  Her daughter is anemic and is having some issues with that so is not much help to her currently.  She has had some conversations with her daughter in [place] about coming out there and her daughter is in favor of it but in the midst of several medical issues she is hesitant getting established with doctors here and what it would look like out there.  I encouraged her to at least pursue what it would take to get in with doctors out there so they would have some feel for what that looks like.  She plans on spending 3 weeks with her daughter in [place] and Christmas might be a good time to explore that more after she gets results from some of the current testing.  Encouraged her to use other coping skills but particularly the tips of skills that she is mindful of when her anxiety begins to escalate.  She does contract for safety having no thoughts of hurting herself or anyone else.  I will meet with her next week. Interventions: Cognitive Behavioral Therapy  Diagnosis: Generalized anxiety disorder, major depressive disorder, recurrent, moderate.  Plan: I will meet with the patient weekly via care agility.  Treatment plan:  We will use cognitive behavioral therapy as well as person centered and supportive therapy in addition to elements of dialectical behavior therapy to help reduce the patient's anxiety and depression by at least 50% with a target date of October 06, 2023.  Goals for improving depression may include having less sadness as indicated by patient report and scores on the PHQ-9, have improved mood and return to a healthier level of functioning, identify causes including environment for depressed mood and learn ways to cope with depression especially those connected to her medical issues.  Interventions include using  cognitive behavioral therapy to explore and replace thoughts and behaviors.  We will look at how depression is experienced in day-to-day living and encouraged sharing of feelings.  We will encourage the use of coping skills for management of depressive symptoms.  Goals for reducing anxiety are to improve her ability to manage anxiety symptoms, better handle stress, identify causes for anxiety and explore ways to lower it, resolve the core conflicts contributing to anxiety as well as manage thoughts and worrisome thinking contributing to feelings of anxiety.  Interventions will include providing education about anxiety, facilitate problem solution skills to help her identify options for resolving stress, teach coping skills for managing anxiety as well as mindfulness and communication in terms of family to reduce her stress level, use cognitive behavior therapy to identify and change anxiety provoking thought and behavior patterns as well as teach distress tolerance and mindfulness skills for alleviating anxiety. Progress: 30%  [name], [facility]                  [name], [facility]               [name], [facility]               [name], [facility]               [name], [facility]               [name], [facility]               [name], [facility]

## 2023-06-06 ENCOUNTER — Encounter: Payer: Medicare (Managed Care) | Admitting: Family Medicine

## 2023-06-08 ENCOUNTER — Encounter (INDEPENDENT_AMBULATORY_CARE_PROVIDER_SITE_OTHER): Payer: Medicare (Managed Care) | Admitting: Family Medicine

## 2023-06-08 ENCOUNTER — Ambulatory Visit: Payer: Medicare (Managed Care) | Admitting: Emergency Medicine

## 2023-06-08 ENCOUNTER — Encounter: Payer: Self-pay | Admitting: Emergency Medicine

## 2023-06-08 VITALS — BP 104/75 | HR 101 | Ht 66.0 in | Wt 215.6 lb

## 2023-06-08 DIAGNOSIS — B3731 Acute candidiasis of vulva and vagina: Secondary | ICD-10-CM

## 2023-06-08 DIAGNOSIS — R911 Solitary pulmonary nodule: Secondary | ICD-10-CM | POA: Diagnosis not present

## 2023-06-08 DIAGNOSIS — J4520 Mild intermittent asthma, uncomplicated: Secondary | ICD-10-CM

## 2023-06-08 MED ORDER — FLUCONAZOLE 150 MG PO TABS: ORAL_TABLET | ORAL | 0 refills | Status: DC

## 2023-06-08 NOTE — Assessment & Plan Note (Signed)
Keep your albuterol available to use 2 puffs when needed for shortness of breath, chest tightness, wheezing. We will probably perform pulmonary function testing at some point going forward to evaluate your asthma

## 2023-06-08 NOTE — Progress Notes (Signed)
Subjective:    Patient ID: Paula Stout, female    DOB: 04/01/1956, 67 y.o.   MRN: [id number]  HPI 67 year old never smoker, with a history of CAD and ACS, chronic atrial fibrillation, migraines, asthma, GERD with rhinitis, diabetes with neuropathy and chronic kidney disease.  She is referred today for an abnormal CT scan of the chest.  She was in the emergency department 05/20/2023 with chest discomfort and a CT chest was performed as below.  She has asthma - can experience wheeze and SOB w exertion. Uses albuterol once a week. Some daily cough, dry cough. She has allergy sx, worst in spring. Flonase and xyzal.  She has an FNA planned for the thyroid nodules.   CT scan of the chest 05/20/2023 reviewed by me, shows a right thyroid nodule into the mediastinum 2.0 x 2.8 cm.  There is a focal nodular density with irregular borders in the superior segment of the right lower lobe 1.3 x 1.6 cm  Thyroid ultrasound on 05/24/2023 showed a multinodular thyroid with solid nodules that meet criteria for tissue sampling.   Review of Systems As per HPI  Past Medical History:  Diagnosis Date   ACS (acute coronary syndrome) (HCC) 07/31/2021   Adhesive capsulitis of left shoulder 12/16/2016   Asthma without status asthmaticus 02/06/2021   At risk for falls 02/04/2019   Atrial fibrillation (HCC) 09/01/2014   Last Assessment & Plan:  Formatting of this note might be different from the original. A:  Chronic.  Sinus rhythm at this time.  States she was told this during admission at [facility] in the past. P:  On baby aspirin at home will continue.  Low dose metoprolol started.   Bipolar 1 disorder, depressed, moderate (HCC) 02/18/2020   Capsulitis of left shoulder 10/20/2021   Chronic anticoagulation 02/20/2020   Chronic atrial fibrillation (HCC) 02/18/2020   Chronic headache 10/11/2013   Chronic interstitial cystitis 12/28/2005   Chronic migraine without aura, intractable, without status migrainosus  06/24/2017   Colon cancer screening 04/15/2016   Last Assessment & Plan:  Formatting of this note might be different from the original. Will schedule for colonoscopy   Depression, recurrent (HCC) 02/18/2020   Diabetes mellitus (HCC)    Dyslipidemia 02/20/2020   GAD (generalized anxiety disorder) 02/18/2020   GERD (gastroesophageal reflux disease) 11/05/2021   History of atrial fibrillation 06/24/2017   History of posttraumatic stress disorder (PTSD) 10/07/2016   Hyperosmolarity due to secondary diabetes mellitus (HCC) 02/06/2021   Impaired mobility and activities of daily living 11/14/2015   Irritable bowel syndrome (IBS)    Junctional tachycardia (HCC) 11/05/2021   Kidney stone 02/06/2021   Migraine without aura, not refractory 02/06/2021   Mixed hyperlipidemia 04/06/2021   Last Assessment & Plan:  Formatting of this note might be different from the original. 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 Controlled   Refractory migraine with aura 02/06/2021   Refractory migraine without aura 02/06/2021   Renal colic on left side 04/22/2017   Stage 3 chronic kidney disease (HCC) 02/20/2020   Unspecified adverse effect of other drug, medicinal and biological substance(995.29) 02/17/2006   Urinary tract obstruction 02/06/2021     Family History  Adopted: Yes  Problem Relation Age of Onset   Migraines Daughter    Bipolar disorder Daughter    Migraines Daughter    Migraines Son    Bipolar disorder Son     Adopted, so unclear family hx 2nd hand smoke exposure She has been a Conservator, museum/gallery Has lived in [place], [place]., has been in  for 4 years.  No hot  tub Owns dogs, cats. Has owned birds before, 20 yrs ago.    Social History   Socioeconomic History   Marital status: Widowed    Spouse name: Not on file   Number of children: 6   Years of education: Not on file   Highest education level: Associate degree: academic program  Occupational History   Occupation: not employed  Tobacco Use   Smoking status: Never   Smokeless tobacco: Never   Tobacco comments:    Never smoke 07/15/22  Vaping Use   Vaping status: Never Used  Substance and Sexual Activity   Alcohol use: Never    Comment: light social   Drug use: Never   Sexual activity: Not Currently  Other Topics Concern   Not on file  Social History Narrative   Lives in [place] with daughter and son-in-law.  Sews clothing in free time as hobby.    Social Determinants of Health   Financial Resource Strain: Medium Risk (02/16/2023)   Overall Financial Resource Strain (CARDIA)    Difficulty of Paying Living Expenses: Somewhat hard  Food Insecurity: No Food Insecurity (02/16/2023)   Hunger Vital Sign    Worried About Running Out of Food in the Last Year: Never true    Ran Out of Food in the Last Year: Never true  Transportation Needs: No Transportation Needs (02/16/2023)   PRAPARE - Administrator, Civil Service (Medical): No    Lack of Transportation (Non-Medical): No  Physical Activity: Unknown (02/16/2023)   Exercise Vital Sign    Days of Exercise per Week: 2 days    Minutes of Exercise per Session: Patient declined  Stress: Stress Concern Present (02/16/2023)   [employer] of Occupational Health - Occupational Stress Questionnaire    Feeling of Stress : To some extent  Social Connections: Socially Isolated (02/16/2023)   Social Connection and Isolation Panel [NHANES]    Frequency of Communication with Friends and Family: More than three times a week    Frequency of Social Gatherings with Friends and Family: Never    Attends Religious Services: Never     Database administrator or Organizations: No    Attends Engineer, structural: Not on file    Marital Status: Widowed  Intimate Partner Violence: Not At Risk (10/27/2022)   Humiliation, Afraid, Rape, and Kick questionnaire    Fear of Current or Ex-Partner: No    Emotionally Abused: No    Physically Abused: No    Sexually Abused: No     Allergies  Allergen Reactions   Bee Venom Anaphylaxis   Onion Other (See Comments)    Respiratory Distress   Tetanus-Diphtheria Toxoids Td Anaphylaxis   Sumatriptan     Passed out, nose bleed    Zolpidem Other (See Comments)    Causes sleep walking   Asa [Aspirin] Nausea And Vomiting   Iodine Rash   Nsaids Nausea Only    Rash, hives and trouble breathing   Sulfa Antibiotics Rash   Vancomycin Rash    Other Reaction(s): STEVENS-JOHNSON     Outpatient  Medications Prior to Visit  Medication Sig Dispense Refill   acetaminophen (TYLENOL) 500 MG tablet Take 1,000 mg by mouth every 6 (six) hours as needed for mild pain.     albuterol (VENTOLIN HFA) 108 (90 Base) MCG/ACT inhaler Inhale 2 puffs into the lungs every 4 (four) hours as needed for wheezing. 1 each 2   Ascorbic Acid (VITAMIN C PO) Take 1 tablet by mouth daily.     atorvastatin (LIPITOR) 80 MG tablet TAKE 1 TABLET DAILY 90 tablet 3   blood glucose meter kit and supplies One Touch Ultra  Check 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 One Touch Ultra  Check blood sugars once daily Dx: E11.9) 1 each 0   botulinum toxin Type A (BOTOX) 200 units injection Inject 155 units IM into multiple site in the face,neck and head once every 90 days (Patient taking differently: Inject 155 Units into the muscle See admin instructions. Inject 155 units IM into multiple site in the face,neck and head once every 90 days) 1 each 4   buPROPion (WELLBUTRIN XL) 300 MG 24 hr tablet TAKE 1 TABLET DAILY 90 tablet 3   cephALEXin (KEFLEX) 500 MG  capsule Take 1 capsule (500 mg total) by mouth 3 (three) times daily for 7 days. 20 capsule 0   Cholecalciferol (VITAMIN D3) 125 MCG (5000 UT) capsule Take 5,000 Units by mouth daily.     Cyanocobalamin (VITAMIN B-12) 5000 MCG TBDP Take 5,000 mcg by mouth daily.     dapagliflozin propanediol (FARXIGA) 10 MG TABS tablet Take 1 tablet (10 mg total) by mouth daily. 90 tablet 1   diltiazem (CARDIZEM) 30 MG tablet Take 1 tablet every 4 hours AS NEEDED for AFIB or SVT heart rate >95 as long as top BP >100. (Patient taking differently: Take 30 mg by mouth See admin instructions. Take 1 tablet every 4 hours AS NEEDED for AFIB or SVT heart rate >95 as long as top BP >100.) 30 tablet 1   ELIQUIS 5 MG TABS tablet TAKE 1 TABLET TWICE A DAY 180 tablet 3   EPINEPHrine 0.3 mg/0.3 mL IJ SOAJ injection Inject 0.3 mg into the muscle as needed for anaphylaxis. 1 each 1   ezetimibe (ZETIA) 10 MG tablet TAKE 1 TABLET DAILY 90 tablet 3   fenofibrate 160 MG tablet Take 1 tablet (160 mg total) by mouth daily. 90 tablet 3   fluticasone (FLONASE) 50 MCG/ACT nasal spray Place 2 sprays into both nostrils daily. (Patient taking differently: Place 2 sprays into both nostrils 3 (three) times a week.) 16 g 6   levocetirizine (XYZAL) 5 MG tablet TAKE 1 TABLET EVERY EVENING (Patient taking differently: Take 5 mg by mouth every evening.) 90 tablet 3   Lurasidone HCl 120 MG TABS Take 1 tablet (120 mg total) by mouth at bedtime. 90 tablet 3   magnesium oxide (MAG-OX) 400 (240 Mg) MG tablet Take 400 mg by mouth daily.     metFORMIN (GLUCOPHAGE-XR) 500 MG 24 hr tablet Take 1 tablet (500 mg total) by mouth 2 (two) times daily with a meal. 180 tablet 0   metoprolol succinate (TOPROL-XL) 100 MG 24 hr tablet Take 1 tablet (100 mg total) by mouth 2 (two) times daily. Take with or immediately following a meal. 180 tablet 3   metoprolol tartrate (LOPRESSOR) 25 MG tablet Take 1 tablet (25 mg total) by mouth every 4 (four) hours as needed  (PALPITATIONS). 90 tablet  3   Multiple Vitamins-Minerals (MULTIVITAMIN WITH MINERALS) tablet Take 1 tablet by mouth daily.     nitroGLYCERIN (NITROSTAT) 0.4 MG SL tablet Place 1 tablet (0.4 mg total) under the tongue every 5 (five) minutes as needed for chest pain. 25 tablet 11   ondansetron (ZOFRAN-ODT) 4 MG disintegrating tablet Take 1 tablet (4 mg total) by mouth every 8 (eight) hours as needed for nausea or vomiting. 20 tablet 0   ONETOUCH VERIO test strip USE DAILY TO CHECK BLOOD SUGAR (Patient taking differently: 1 each by Other route as needed for other (see below). Use daily to check blood sugar.) 100 strip 3   pantoprazole (PROTONIX) 40 MG tablet Take 1 tablet (40 mg total) by mouth daily. 90 tablet 3   QUEtiapine (SEROQUEL) 300 MG tablet Take 1 tablet (300 mg total) by mouth at bedtime. 90 tablet 3   Rimegepant Sulfate (NURTEC) 75 MG TBDP Take 1 tablet (75 mg total) by mouth as needed (take 1 tab at the earlist onset of a migraine. Max 1 tab in 24 hours). 10 tablet 5   [START ON 06/28/2023] tirzepatide (MOUNJARO) 10 MG/0.5ML Pen Inject 10 mg into the skin once a week for 28 days. 2 mL 0   [START ON 07/26/2023] tirzepatide (MOUNJARO) 12.5 MG/0.5ML Pen Inject 12.5 mg into the skin once a week. 2 mL 0   tirzepatide (MOUNJARO) 7.5 MG/0.5ML Pen Inject 7.5 mg into the skin once a week for 28 days. 2 mL 0   topiramate (TOPAMAX) 50 MG tablet TAKE 3 TABLETS TWICE A DAY (Patient taking differently: Take 150 mg by mouth 2 (two) times daily.) 180 tablet 11   Vilazodone HCl (VIIBRYD) 40 MG TABS Take 1 tablet (40 mg total) by mouth daily. 30 tablet 3   No facility-administered medications prior to visit.   Is thinning and uploading if is still applying     Objective:   Physical Exam  Vitals:   06/08/23 0925  BP: 104/75  Pulse: (!) 101  SpO2: 95%  Weight: 215 lb 9.6 oz (97.8 kg)  Height: 5\' 6"  (1.676 m)   Gen: Pleasant, well-nourished, in no distress,  normal affect  ENT: No lesions,   mouth clear,  oropharynx clear, no postnasal drip  Neck: No JVD, no stridor  Lungs: No use of accessory muscles, no crackles or wheezing on normal respiration, no wheeze on forced expiration  Cardiovascular: RRR, heart sounds normal, no murmur or gallops, no peripheral edema  Musculoskeletal: No deformities, no cyanosis or clubbing  Neuro: alert, awake, non focal  Skin: Warm, no lesions or rash     Assessment & Plan:   Asthma Keep your albuterol available to use 2 puffs when needed for shortness of breath, chest tightness, wheezing. We will probably perform pulmonary function testing at some point going forward to evaluate your asthma   Pulmonary nodule 1 cm or greater in diameter Unclear etiology.  We talked about the possible causes including inflammatory, infectious, possibly malignancy.  She has a thyroid nodule that is under evaluation, unclear whether this is connected to the nodule.  We will attempt to correlate when the biopsy is done.  We will repeat her CT scan of the chest at the 37-month mark which is December.  I will check autoimmune labs today.  Follow-up to review   Levy Pupa, MD, PhD 06/08/2023, 10:04 AM [facility] [phone] or if no answer before 7:00PM call [phone] For any issues after 7:00PM please call eLink [phone]

## 2023-06-08 NOTE — Patient Instructions (Signed)
We reviewed your CT scan of the chest today. We will perform blood work today We will repeat your CT scan of the chest in December 2024 to compare with priors. Keep your albuterol available to use 2 puffs when needed for shortness of breath, chest tightness, wheezing. We will probably perform pulmonary function testing at some point going forward to evaluate your asthma Follow Dr. Delton Coombes in December after your CT chest so we can review those results together.

## 2023-06-08 NOTE — Assessment & Plan Note (Signed)
Unclear etiology.  We talked about the possible causes including inflammatory, infectious, possibly malignancy.  She has a thyroid nodule that is under evaluation, unclear whether this is connected to the nodule.  We will attempt to correlate when the biopsy is done.  We will repeat her CT scan of the chest at the 43-month mark which is December.  I will check autoimmune labs today.  Follow-up to review

## 2023-06-08 NOTE — Telephone Encounter (Signed)

## 2023-06-09 ENCOUNTER — Ambulatory Visit: Payer: Medicare (Managed Care) | Admitting: Behavioral Health

## 2023-06-09 DIAGNOSIS — N183 Chronic kidney disease, stage 3 unspecified: Secondary | ICD-10-CM | POA: Diagnosis not present

## 2023-06-09 DIAGNOSIS — I129 Hypertensive chronic kidney disease with stage 1 through stage 4 chronic kidney disease, or unspecified chronic kidney disease: Secondary | ICD-10-CM | POA: Diagnosis not present

## 2023-06-09 DIAGNOSIS — R809 Proteinuria, unspecified: Secondary | ICD-10-CM | POA: Diagnosis not present

## 2023-06-10 ENCOUNTER — Encounter: Payer: Self-pay | Admitting: Behavioral Health

## 2023-06-10 ENCOUNTER — Ambulatory Visit (INDEPENDENT_AMBULATORY_CARE_PROVIDER_SITE_OTHER): Payer: Medicare (Managed Care) | Admitting: Behavioral Health

## 2023-06-10 DIAGNOSIS — F411 Generalized anxiety disorder: Secondary | ICD-10-CM

## 2023-06-10 DIAGNOSIS — F331 Major depressive disorder, recurrent, moderate: Secondary | ICD-10-CM

## 2023-06-10 LAB — ANA+ENA+DNA/DS+SCL 70+SJOSSA/B
ANA Titer 1: NEGATIVE
ENA RNP Ab: 0.2 AI (ref 0.0–0.9)
ENA SM Ab Ser-aCnc: <0.2 AI (ref 0.0–0.9)
ENA SSA (RO) Ab: <0.2 AI (ref 0.0–0.9)
ENA SSB (LA) Ab: <0.2 AI (ref 0.0–0.9)
Scleroderma (Scl-70) (ENA) Antibody, IgG: <0.2 AI (ref 0.0–0.9)
dsDNA Ab: <1 [IU]/mL (ref 0–9)

## 2023-06-10 NOTE — Progress Notes (Signed)
[facility] Counselor/Therapist Progress Note  Patient ID: Paula Stout, MRN: [id number],    Date: 06/10/2023  Time Spent: 58 minutes, 1:01 PM until 1:59 PM spent with the patient.This session was held via video teletherapy. The patient consented to the video teletherapy and was located in her home during this session. She is aware it is the responsibility of the patient to secure confidentiality on her end of the session. The provider was in a private home  for the duration of this session.      Treatment Type: Individual Therapy  Reported Symptoms: Anxiety, depression  Mental Status Exam: Appearance:  Casual     Behavior: Appropriate  Motor: Normal  Speech/Language:  Normal Rate  Affect: Appropriate  Mood: normal  Thought process: normal  Thought content:   WNL  Sensory/Perceptual disturbances:   WNL  Orientation: oriented to person, place, time/date, situation, day of week, month of year, and year  Attention: Good  Concentration: Good  Memory: WNL  Fund of knowledge:  Good  Insight:   Good  Judgment:  Good  Impulse Control: Good   Risk Assessment: Danger to Self:  No Self-injurious Behavior: No Danger to Others: No Duty to Warn:no Physical Aggression / Violence:No  Access to Firearms a concern: No  Gang Involvement:No   Subjective: The patient reports that her chest pain issues have been a little better but she also has been very busy working 4 to 5 hours a day 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  She is thankful for the distraction.  She did go to the pulmonologist who noted that the nodule is a little larger than normal.  He did run blood work which all came back clean and said they would do another MRI in December.  She did not understand why he was waiting so long especially when they did not know it was or was not cancerous and he did not speculate necessarily.  She is meeting with a doctor about the possible nodules on her thyroid  soon.  She still gets tired very easily so she limits how many times she goes downstairs because of that but also because of how the little is being done to take care of the house.  Her son-in-law is now getting very frustrated with the condition of the house as well as the smell of the house because of all the animals.  He has reached out to an organization that helps with hoarding and her daughter has not been resistant to that.  They are also looking at someone to come in and clean the house consistently as well as possibly hiring a high school student to come clean out Later boxes because her daughter is not doing that.  The patient is hesitant to go downstairs because of those conditions but one of the two 26-month-old dogs which is large has started jumping on her almost knocking her over earlier in the week.  Her dog does not respond well to that so she goes down long enough to let him outside fix something to eat and get right back upstairs.  She does not like to have to isolate like that but knows that is what is best currently.  She and her daughter did have a conversation about the house.  Her daughter asked if she thought about moving to [place] with her sons but the patient knows that is not a possibility.  She told her that it would be  better home if she would create a healthier cleaner environment for everyone.  She knows now the option of moving is limited because she has a good care team here and would like to see things at home be better and has addressed that directly with her daughter.  He does not report any panic attacks over the past week or so but does present with anxiety and not knowing necessarily what is going on with her medically.  Encouraged her to use other coping skills but particularly the tips of skills that she is mindful of when her anxiety begins to escalate.  She does contract for safety having no thoughts of hurting herself or anyone else.  I will meet with her next  week. Interventions: Cognitive Behavioral Therapy  Diagnosis: Generalized anxiety disorder, major depressive disorder, recurrent, moderate.  Plan: I will meet with the patient weekly via care agility.  Treatment plan: We will use cognitive behavioral therapy as well as person centered and supportive therapy in addition to elements of dialectical behavior therapy to help reduce the patient's anxiety and depression by at least 50% with a target date of October 06, 2023.  Goals for improving depression may include having less sadness as indicated by patient report and scores on the PHQ-9, have improved mood and return to a healthier level of functioning, identify causes including environment for depressed mood and learn ways to cope with depression especially those connected to her medical issues.  Interventions include using cognitive behavioral therapy to explore and replace thoughts and behaviors.  We will look at how depression is experienced in day-to-day living and encouraged sharing of feelings.  We will encourage the use of coping skills for management of depressive symptoms.  Goals for reducing anxiety are to improve her ability to manage anxiety symptoms, better handle stress, identify causes for anxiety and explore ways to lower it, resolve the core conflicts contributing to anxiety as well as manage thoughts and worrisome thinking contributing to feelings of anxiety.  Interventions will include providing education about anxiety, facilitate problem solution skills to help her identify options for resolving stress, teach coping skills for managing anxiety as well as mindfulness and communication in terms of family to reduce her stress level, use cognitive behavior therapy to identify and change anxiety provoking thought and behavior patterns as well as teach distress tolerance and mindfulness skills for alleviating anxiety. Progress: 30%  [name],  [facility]                  [name], [facility]               [name], [facility]               [name], [facility]               [name], [facility]               [name], [facility]               [name], [facility]               [name], [facility]

## 2023-06-12 LAB — RHEUMATOID FACTOR: Rheumatoid fact SerPl-aCnc: <10 [IU]/mL (ref <14)

## 2023-06-12 LAB — ANCA SCREEN W REFLEX TITER: ANCA SCREEN: NEGATIVE

## 2023-06-12 LAB — ANGIOTENSIN CONVERTING ENZYME: Angiotensin-Converting Enzyme: 34 U/L (ref 9–67)

## 2023-06-13 DIAGNOSIS — I471 Supraventricular tachycardia, unspecified: Secondary | ICD-10-CM | POA: Diagnosis not present

## 2023-06-14 ENCOUNTER — Encounter: Payer: Medicare (Managed Care) | Admitting: Family Medicine

## 2023-06-16 ENCOUNTER — Ambulatory Visit: Payer: Medicare (Managed Care) | Admitting: Behavioral Health

## 2023-06-16 ENCOUNTER — Encounter: Payer: Self-pay | Admitting: Behavioral Health

## 2023-06-16 ENCOUNTER — Encounter: Payer: Self-pay | Admitting: Emergency Medicine

## 2023-06-16 DIAGNOSIS — F411 Generalized anxiety disorder: Secondary | ICD-10-CM | POA: Diagnosis not present

## 2023-06-16 DIAGNOSIS — F331 Major depressive disorder, recurrent, moderate: Secondary | ICD-10-CM

## 2023-06-16 NOTE — Progress Notes (Signed)
[facility] Counselor/Therapist Progress Note  Patient ID: Paula Stout, MRN: [id number],    Date: 06/16/2023  Time Spent: 57 minutes, 3:00 PM  to 3:57 spent with the patient.This session was held via video teletherapy. The patient consented to the video teletherapy and was located in her home during this session. She is aware it is the responsibility of the patient to secure confidentiality on her end of the session. The provider was in a private home  for the duration of this session.      Treatment Type: Individual Therapy  Reported Symptoms: Anxiety, depression  Mental Status Exam: Appearance:  Casual     Behavior: Appropriate  Motor: Normal  Speech/Language:  Normal Rate  Affect: Appropriate  Mood: normal  Thought process: normal  Thought content:   WNL  Sensory/Perceptual disturbances:   WNL  Orientation: oriented to person, place, time/date, situation, day of week, month of year, and year  Attention: Good  Concentration: Good  Memory: WNL  Fund of knowledge:  Good  Insight:   Good  Judgment:  Good  Impulse Control: Good   Risk Assessment: Danger to Self:  No Self-injurious Behavior: No Danger to Others: No Duty to Warn:no Physical Aggression / Violence:No  Access to Firearms a concern: No  Gang Involvement:No   Subjective: The patient continues to have episodes in which her heart rate stays rapid.  There has not been any significant relief since the ablation several weeks ago.  She is meeting with 1 doctor next week but is not having another scan until early December for the nodule that they saw on her lungs.  They may do a biopsy on the nodule that they saw on her thyroid.  Not knowing is the hardest part and she continues to have anxiety saying that she has had several panic attacks since our last session.  She said she gets shaky and it feels like things are closing in around her and it is difficult to breathe.  She at times has a  migraine associated with the anxiety or at times seeing anxiety is driven by the migraine.  She has some agitation to go to the hospital because the last 2 times she has been she has gotten another diagnosis while going there to treat the migraine specifically.  Her migraine medication is helping some but there is still significant discomfort with that.  She does not feel that the Viibryd which she has been on since July is making any difference in terms of mood stability.  She is not seeing her PA who prescribed the medication until a month from now so I encouraged her to send him in my chart message telling him specifically about the increasing frequency of panic attacks as well as migraines.  I did remind her of the tips of skill as well as other anxiety reduction techniques.  For the first time in today's session we did a visualization exercise and I encouraged her to practice that daily for a few minutes between now and next week to see if that can be a good coping mechanism for her.  She says typically she tries to go to sleep when she can.  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 Diagnosis: Generalized anxiety disorder, major depressive disorder, recurrent, moderate.  Plan: I will meet with the patient weekly via care agility.  Treatment plan: We will  use cognitive behavioral therapy as well as person centered and supportive therapy in addition to elements of dialectical behavior therapy to help reduce the patient's anxiety and depression by at least 50% with a target date of October 06, 2023.  Goals for improving depression may include having less sadness as indicated by patient report and scores on the PHQ-9, have improved mood and return to a healthier level of functioning, identify causes including environment for depressed mood and learn ways to cope with depression especially those connected to her medical issues.  Interventions include  using cognitive behavioral therapy to explore and replace thoughts and behaviors.  We will look at how depression is experienced in day-to-day living and encouraged sharing of feelings.  We will encourage the use of coping skills for management of depressive symptoms.  Goals for reducing anxiety are to improve her ability to manage anxiety symptoms, better handle stress, identify causes for anxiety and explore ways to lower it, resolve the core conflicts contributing to anxiety as well as manage thoughts and worrisome thinking contributing to feelings of anxiety.  Interventions will include providing education about anxiety, facilitate problem solution skills to help her identify options for resolving stress, teach coping skills for managing anxiety as well as mindfulness and communication in terms of family to reduce her stress level, use cognitive behavior therapy to identify and change anxiety provoking thought and behavior patterns as well as teach distress tolerance and mindfulness skills for alleviating anxiety. Progress: 30%  [name], [facility]                  [name], [facility]               [name], [facility]               [name], [facility]               [name], [facility]               [name], [facility]               [name], [facility]               [name], [facility]               [name], [facility]

## 2023-06-19 NOTE — Telephone Encounter (Signed)
Yes, lets change the CT to late November - whenever works best for her

## 2023-06-20 ENCOUNTER — Encounter (HOSPITAL_BASED_OUTPATIENT_CLINIC_OR_DEPARTMENT_OTHER): Payer: Self-pay

## 2023-06-20 ENCOUNTER — Emergency Department (HOSPITAL_BASED_OUTPATIENT_CLINIC_OR_DEPARTMENT_OTHER)
Admission: EM | Admit: 2023-06-20 | Discharge: 2023-06-20 | Disposition: A | Discharge status: Home / Self Care | Payer: Medicare (Managed Care) | Attending: Emergency Medicine | Admitting: Emergency Medicine

## 2023-06-20 ENCOUNTER — Other Ambulatory Visit: Payer: Self-pay

## 2023-06-20 ENCOUNTER — Encounter: Payer: Self-pay | Admitting: Cardiology

## 2023-06-20 ENCOUNTER — Ambulatory Visit: Payer: Medicare (Managed Care) | Attending: Cardiology | Admitting: Cardiology

## 2023-06-20 VITALS — BP 118/76 | HR 92 | Ht 66.0 in | Wt 209.1 lb

## 2023-06-20 DIAGNOSIS — H53149 Visual discomfort, unspecified: Secondary | ICD-10-CM | POA: Diagnosis not present

## 2023-06-20 DIAGNOSIS — R112 Nausea with vomiting, unspecified: Secondary | ICD-10-CM | POA: Insufficient documentation

## 2023-06-20 DIAGNOSIS — I471 Supraventricular tachycardia, unspecified: Secondary | ICD-10-CM

## 2023-06-20 DIAGNOSIS — R519 Headache, unspecified: Secondary | ICD-10-CM | POA: Diagnosis not present

## 2023-06-20 DIAGNOSIS — D6869 Other thrombophilia: Secondary | ICD-10-CM

## 2023-06-20 DIAGNOSIS — I480 Paroxysmal atrial fibrillation: Secondary | ICD-10-CM | POA: Diagnosis not present

## 2023-06-20 MED ORDER — DILTIAZEM HCL ER COATED BEADS 240 MG PO CP24: 240.0000 mg | ORAL_CAPSULE | Freq: Every day | ORAL | 6 refills | Status: DC

## 2023-06-20 MED ORDER — SODIUM CHLORIDE 0.9 % IV BOLUS
1000.0000 mL | Freq: Once | INTRAVENOUS | Status: AC
  Administered 2023-06-20: 1000 mL via INTRAVENOUS

## 2023-06-20 MED ORDER — DEXAMETHASONE SODIUM PHOSPHATE 10 MG/ML IJ SOLN
10.0000 mg | Freq: Once | INTRAMUSCULAR | Status: AC
  Administered 2023-06-20: 10 mg via INTRAVENOUS
  Filled 2023-06-20: qty 1

## 2023-06-20 MED ORDER — MAGNESIUM SULFATE 2 GM/50ML IV SOLN
2.0000 g | Freq: Once | INTRAVENOUS | Status: AC
  Administered 2023-06-20: 2 g via INTRAVENOUS
  Filled 2023-06-20: qty 50

## 2023-06-20 MED ORDER — DIPHENHYDRAMINE HCL 50 MG/ML IJ SOLN
25.0000 mg | Freq: Once | INTRAMUSCULAR | Status: AC
  Administered 2023-06-20: 25 mg via INTRAVENOUS
  Filled 2023-06-20: qty 1

## 2023-06-20 MED ORDER — HYDROMORPHONE HCL 1 MG/ML IJ SOLN
0.5000 mg | Freq: Once | INTRAMUSCULAR | Status: AC
  Administered 2023-06-20: 0.5 mg via INTRAVENOUS
  Filled 2023-06-20: qty 1

## 2023-06-20 MED ORDER — PROCHLORPERAZINE EDISYLATE 10 MG/2ML IJ SOLN
10.0000 mg | Freq: Once | INTRAMUSCULAR | Status: AC
  Administered 2023-06-20: 10 mg via INTRAVENOUS
  Filled 2023-06-20: qty 2

## 2023-06-20 MED ORDER — METOCLOPRAMIDE HCL 5 MG/ML IJ SOLN
10.0000 mg | Freq: Once | INTRAMUSCULAR | Status: AC
  Administered 2023-06-20: 10 mg via INTRAVENOUS
  Filled 2023-06-20: qty 2

## 2023-06-20 NOTE — Progress Notes (Signed)
Electrophysiology Office Note:   Date:  06/20/2023  ID:  Paula Stout, DOB May 01, 1956, MRN [id number]  Primary Cardiologist: Gypsy Balsam, MD Electrophysiologist: Regan Lemming, MD      History of Present Illness:   Paula Stout is a 67 y.o. female with h/o atrial fibrillation post ablation 10/27/2022, SVT seen today for routine electrophysiology followup.   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  She is unfortunately continued to have palpitations.  She wore a cardiac monitor that showed episodes of sinus tachycardia.  She also brings in heart rate recordings from home that show episodes of heart rates in the 120s to 130s.  She states that she has rapid heart rates when she exerts herself, going to the mailbox or going up and down stairs.  Heart rates are again in the 120s to 130s associated with significant fatigue and shortness of breath.   she denies chest pain, palpitations, PND, orthopnea, nausea, vomiting, dizziness, syncope, edema, weight gain, or early satiety.   Review of systems complete and found to be negative unless listed in HPI.   EP Information / Studies Reviewed:    EKG is not ordered today. EKG from 06/01/23 reviewed which showed sinus rhythm        Risk Assessment/Calculations:    CHA2DS2-VASc Score = 3   This indicates a 3.2% annual risk of stroke. The patient's score is based upon: CHF History: 0 HTN History: 0 Diabetes History: 1 Stroke History: 0 Vascular Disease History: 0 Age Score: 1 Gender Score: 1             Physical Exam:   VS:  BP 118/76   Pulse 92   Ht 5\' 6"  (1.676 m)   Wt 209 lb 1.9 oz (94.9 kg)   SpO2 97%   BMI 33.75 kg/m    Wt Readings from Last 3 Encounters:  06/20/23 209 lb 1.9 oz (94.9 kg)  06/08/23 215 lb 9.6 oz (97.8 kg)  05/31/23 214 lb 8 oz (97.3 kg)     GEN: Well nourished, well developed in no acute distress NECK: No JVD; No carotid bruits CARDIAC: Regular rate and rhythm, no murmurs, rubs,  gallops RESPIRATORY:  Clear to auscultation without rales, wheezing or rhonchi  ABDOMEN: Soft, non-tender, non-distended EXTREMITIES:  No edema; No deformity   ASSESSMENT AND PLAN:    1.  Paroxysmal atrial fibrillation: Currently on metoprolol.  Post ablation 10/27/2022.  No further episodes.  Continue current management.  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  She is currently on metoprolol.  Boris Engelmann add diltiazem.  2.  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  Follow up with Dr. Elberta Fortis in 3 months  Signed, Rabia Argote Jorja Loa, MD

## 2023-06-20 NOTE — Discharge Instructions (Signed)
Follow up with your neurologist in the office.  Return for sudden worsening headache one-sided numbness or weakness or difficulty speech or swallowing.

## 2023-06-20 NOTE — Patient Instructions (Addendum)
Medication Instructions:  Your physician has recommended you make the following change in your medication:  START Diltiazem 240 mg once daily  *If you need a refill on your cardiac medications before your next appointment, please call your pharmacy*   Lab Work: None ordered   Testing/Procedures: None ordered   Follow-Up: At [facility], you and your health needs are our priority.  As part of our continuing mission to provide you with exceptional heart care, we have created designated Provider Care Teams.  These Care Teams include your primary Cardiologist (physician) and Advanced Practice Providers (APPs -  Physician Assistants and Nurse Practitioners) who all work together to provide you with the care you need, when you need it.  Your next appointment:   3 month(s)  The format for your next appointment:   In Person  Provider:   Loman Brooklyn, MD    Thank you for choosing [facility]!!   Dory Horn, RN [phone]

## 2023-06-20 NOTE — ED Triage Notes (Signed)
Patient reports headache that started yesterday. Patient has taken her max amount of home medications and was advised by her neurologist to come to the ER. Patient states she has also been vomiting and has some sensitivity to light.  History of migraine headache.

## 2023-06-20 NOTE — ED Notes (Addendum)
Patients IV infiltrated 5-6 inches proximal to the IV site. Warm compress given and secured to the site. All fluids stopped and IV removed.

## 2023-06-20 NOTE — ED Provider Notes (Signed)
[facility] Provider Note   CSN: [id number] Arrival date & time: 06/20/23  1933     History  Chief Complaint  Patient presents with   Headache    Paula Stout is a 67 y.o. female.  67 yo F with a chief complaints of a headache.  She says this feels like her typical migraine.  She tried her medications at home but without improvement.  She has gone to the ER for this in the past.  Typically takes specific medicines that make it better.  She had called her neurologist today who encouraged her to come to the ED if it did not improve or got worse.  Photophobia phonophobia and having nausea and vomiting with this.  No fevers no trauma.  Denies one-sided numbness or weakness in his difficulty speech or swallowing.   Headache      Home Medications Prior to Admission medications   Medication Sig Start Date End Date Taking? Authorizing Provider  acetaminophen (TYLENOL) 500 MG tablet Take 1,000 mg by mouth every 6 (six) hours as needed for mild pain.    [provider]  albuterol (VENTOLIN HFA) 108 (90 Base) MCG/ACT inhaler Inhale 2 puffs into the lungs every 4 (four) hours as needed for wheezing. 06/23/20   Sharlene Dory, DO  Ascorbic Acid (VITAMIN C PO) Take 1 tablet by mouth daily.    [provider]  atorvastatin (LIPITOR) 80 MG tablet TAKE 1 TABLET DAILY 02/09/23   Sharlene Dory, DO  blood glucose meter kit and supplies One Touch Ultra  Check blood sugars once daily Dx: E11.9 Patient taking differently: 1 each by Other route See admin instructions. One Touch Ultra  Check blood sugars once daily Dx: E11.9 10/01/21   Sharlene Dory, DO  botulinum toxin Type A (BOTOX) 200 units injection Inject 155 units IM into multiple site in the face,neck and head once every 90 days Patient taking differently: Inject 155 Units into the muscle See admin instructions. Inject 155 units IM into multiple site in  the face,neck and head once every 90 days 06/15/22   Drema Dallas, DO  buPROPion (WELLBUTRIN XL) 300 MG 24 hr tablet TAKE 1 TABLET DAILY 10/29/22   Sharlene Dory, DO  Cholecalciferol (VITAMIN D3) 125 MCG (5000 UT) capsule Take 5,000 Units by mouth daily.    [provider]  Cyanocobalamin (VITAMIN B-12) 5000 MCG TBDP Take 5,000 mcg by mouth daily.    [provider]  dapagliflozin propanediol (FARXIGA) 10 MG TABS tablet Take 1 tablet (10 mg total) by mouth daily. 03/23/23   Sharlene Dory, DO  diltiazem (CARDIZEM CD) 240 MG 24 hr capsule Take 1 capsule (240 mg total) by mouth daily. 06/20/23   Camnitz, Andree Coss, MD  diltiazem (CARDIZEM) 30 MG tablet Take 1 tablet every 4 hours AS NEEDED for AFIB or SVT heart rate >95 as long as top BP >100. Patient taking differently: Take 30 mg by mouth See admin instructions. Take 1 tablet every 4 hours AS NEEDED for AFIB or SVT heart rate >95 as long as top BP >100. 12/03/22   Sherie Don, NP  ELIQUIS 5 MG TABS tablet TAKE 1 TABLET TWICE A DAY 02/02/23   Wendling, Jilda Roche, DO  EPINEPHrine 0.3 mg/0.3 mL IJ SOAJ injection Inject 0.3 mg into the muscle as needed for anaphylaxis. 11/17/20   Sharlene Dory, DO  ezetimibe (ZETIA) 10 MG tablet TAKE 1 TABLET DAILY 02/03/23  Sharlene Dory, DO  fenofibrate 160 MG tablet Take 1 tablet (160 mg total) by mouth daily. 11/23/22   Sharlene Dory, DO  fluconazole (DIFLUCAN) 150 MG tablet Take 1 tab, repeat in 72 hours if no improvement. 06/08/23   Sharlene Dory, DO  fluticasone (FLONASE) 50 MCG/ACT nasal spray Place 2 sprays into both nostrils daily. Patient taking differently: Place 2 sprays into both nostrils 3 (three) times a week. 01/29/23   Junie Spencer, FNP  levocetirizine (XYZAL) 5 MG tablet TAKE 1 TABLET EVERY EVENING Patient taking differently: Take 5 mg by mouth every evening. 01/14/23   Sharlene Dory, DO  Lurasidone HCl 120 MG  TABS Take 1 tablet (120 mg total) by mouth at bedtime. 08/03/22   Sharlene Dory, DO  magnesium oxide (MAG-OX) 400 (240 Mg) MG tablet Take 400 mg by mouth daily.    [provider]  metFORMIN (GLUCOPHAGE-XR) 500 MG 24 hr tablet Take 1 tablet (500 mg total) by mouth 2 (two) times daily with a meal. 04/20/23   Wendling, Jilda Roche, DO  metoprolol succinate (TOPROL-XL) 100 MG 24 hr tablet Take 1 tablet (100 mg total) by mouth 2 (two) times daily. Take with or immediately following a meal. 04/12/23   Camnitz, Andree Coss, MD  metoprolol tartrate (LOPRESSOR) 25 MG tablet Take 1 tablet (25 mg total) by mouth every 4 (four) hours as needed (PALPITATIONS). 08/24/22   Camnitz, Andree Coss, MD  Multiple Vitamins-Minerals (MULTIVITAMIN WITH MINERALS) tablet Take 1 tablet by mouth daily.    [provider]  nitroGLYCERIN (NITROSTAT) 0.4 MG SL tablet Place 1 tablet (0.4 mg total) under the tongue every 5 (five) minutes as needed for chest pain. 03/16/23   Georgeanna Lea, MD  ondansetron (ZOFRAN-ODT) 4 MG disintegrating tablet Take 1 tablet (4 mg total) by mouth every 8 (eight) hours as needed for nausea or vomiting. 01/27/23   Carmelia Roller, Jilda Roche, DO  ONETOUCH VERIO test strip USE DAILY TO CHECK BLOOD SUGAR Patient taking differently: 1 each by Other route as needed for other (see below). Use daily to check blood sugar. 10/29/22   Sharlene Dory, DO  pantoprazole (PROTONIX) 40 MG tablet Take 1 tablet (40 mg total) by mouth daily. 11/22/22   Sharlene Dory, DO  QUEtiapine (SEROQUEL) 300 MG tablet Take 1 tablet (300 mg total) by mouth at bedtime. 08/03/22   Sharlene Dory, DO  Rimegepant Sulfate (NURTEC) 75 MG TBDP Take 1 tablet (75 mg total) by mouth as needed (take 1 tab at the earlist onset of a migraine. Max 1 tab in 24 hours). 04/04/23   Drema Dallas, DO  tirzepatide [facility]) 10 MG/0.5ML Pen Inject 10 mg into the skin once a week for 28 days. 06/28/23  07/26/23  Sharlene Dory, DO  tirzepatide [facility] 12.5 MG/0.5ML Pen Inject 12.5 mg into the skin once a week. 07/26/23   Sharlene Dory, DO  tirzepatide [facility]) 7.5 MG/0.5ML Pen Inject 7.5 mg into the skin once a week for 28 days. 05/31/23 06/28/23  Sharlene Dory, DO  topiramate (TOPAMAX) 50 MG tablet TAKE 3 TABLETS TWICE A DAY Patient taking differently: Take 150 mg by mouth 2 (two) times daily. 01/14/23   Sharlene Dory, DO  Vilazodone HCl (VIIBRYD) 40 MG TABS Take 1 tablet (40 mg total) by mouth daily. 04/06/23   Joan Flores, NP      Allergies    Bee venom, Onion, Tetanus-diphtheria toxoids td, Sumatriptan, Zolpidem,  Asa [aspirin], Iodine, Nsaids, Sulfa antibiotics, and Vancomycin    Review of Systems   Review of Systems  Neurological:  Positive for headaches.    Physical Exam Updated Vital Signs BP 135/87   Pulse 85   Temp (!) 97.5 F (36.4 C) (Oral)   Resp 16   Ht 5\' 6"  (1.676 m)   Wt 94.8 kg   SpO2 98%   BMI 33.73 kg/m  Physical Exam Vitals and nursing note reviewed.  Constitutional:      General: She is not in acute distress.    Appearance: She is well-developed. She is not diaphoretic.  HENT:     Head: Normocephalic and atraumatic.  Eyes:     Pupils: Pupils are equal, round, and reactive to light.  Cardiovascular:     Rate and Rhythm: Normal rate and regular rhythm.     Heart sounds: No murmur heard.    No friction rub. No gallop.  Pulmonary:     Effort: Pulmonary effort is normal.     Breath sounds: No wheezing or rales.  Abdominal:     General: There is no distension.     Palpations: Abdomen is soft.     Tenderness: There is no abdominal tenderness.  Musculoskeletal:        General: No tenderness.     Cervical back: Normal range of motion and neck supple.  Skin:    General: Skin is warm and dry.  Neurological:     Mental Status: She is alert and oriented to person, place, and time.     Cranial Nerves: Cranial  nerves 2-12 are intact.     Sensory: Sensation is intact.     Motor: Motor function is intact.     Coordination: Coordination is intact.     Comments: Benign neurologic exam  Psychiatric:        Behavior: Behavior normal.     ED Results / Procedures / Treatments   Labs (all labs ordered are listed, but only abnormal results are displayed) Labs Reviewed - No data to display  EKG None  Radiology No results found.  Procedures Procedures    Medications Ordered in ED Medications  metoCLOPramide (REGLAN) injection 10 mg (10 mg Intravenous Given 06/20/23 2118)  diphenhydrAMINE (BENADRYL) injection 25 mg (25 mg Intravenous Given 06/20/23 2117)  sodium chloride 0.9 % bolus 1,000 mL (1,000 mLs Intravenous New Bag/Given 06/20/23 2124)  HYDROmorphone (DILAUDID) injection 0.5 mg (0.5 mg Intravenous Given 06/20/23 2117)  dexamethasone (DECADRON) injection 10 mg (10 mg Intravenous Given 06/20/23 2118)  prochlorperazine (COMPAZINE) injection 10 mg (10 mg Intravenous Given 06/20/23 2219)  diphenhydrAMINE (BENADRYL) injection 25 mg (25 mg Intravenous Given 06/20/23 2219)  magnesium sulfate IVPB 2 g 50 mL (0 g Intravenous Stopped 06/20/23 2238)    ED Course/ Medical Decision Making/ A&P                                 Medical Decision Making Risk Prescription drug management.   67 yo F with a chief complaints of a headache.  Feels like her typical migraines but did not resolve with her typical abortive therapy at home.  She called her neurologist who encouraged her to come here to be treated.  Patient has on her phone the list of medication she typically gets with her migraine headaches.  Will give the same here.  IV fluids.  Reassess.  Patient with some significant improvement but  still persistent symptoms.  She does not yet feel comfortable going home.  She also had infiltration of her IV site.  Will give another dose of medications.  Reassess.  Patient reassessed and feeling much  better and would like to go home.  10:55 PM:  I have discussed the diagnosis/risks/treatment options with the patient.  Evaluation and diagnostic testing in the emergency department does not suggest an emergent condition requiring admission or immediate intervention beyond what has been performed at this time.  They will follow up with PCP, neuro. We also discussed returning to the ED immediately if new or worsening sx occur. We discussed the sx which are most concerning (e.g., sudden worsening pain, fever, inability to tolerate by mouth, stoke s/sx) that necessitate immediate return. Medications administered to the patient during their visit and any new prescriptions provided to the patient are listed below.  Medications given during this visit Medications  metoCLOPramide (REGLAN) injection 10 mg (10 mg Intravenous Given 06/20/23 2118)  diphenhydrAMINE (BENADRYL) injection 25 mg (25 mg Intravenous Given 06/20/23 2117)  sodium chloride 0.9 % bolus 1,000 mL (1,000 mLs Intravenous New Bag/Given 06/20/23 2124)  HYDROmorphone (DILAUDID) injection 0.5 mg (0.5 mg Intravenous Given 06/20/23 2117)  dexamethasone (DECADRON) injection 10 mg (10 mg Intravenous Given 06/20/23 2118)  prochlorperazine (COMPAZINE) injection 10 mg (10 mg Intravenous Given 06/20/23 2219)  diphenhydrAMINE (BENADRYL) injection 25 mg (25 mg Intravenous Given 06/20/23 2219)  magnesium sulfate IVPB 2 g 50 mL (0 g Intravenous Stopped 06/20/23 2238)     The patient appears reasonably screen and/or stabilized for discharge and I doubt any other medical condition or other [facility] requiring further screening, evaluation, or treatment in the ED at this time prior to discharge.          Final Clinical Impression(s) / ED Diagnoses Final diagnoses:  Bad headache    Rx / DC Orders ED Discharge Orders     None         Melene Plan, DO 06/20/23 2255

## 2023-06-22 ENCOUNTER — Ambulatory Visit: Payer: Medicare (Managed Care) | Admitting: Behavioral Health

## 2023-06-23 ENCOUNTER — Ambulatory Visit (HOSPITAL_BASED_OUTPATIENT_CLINIC_OR_DEPARTMENT_OTHER)
Admission: RE | Admit: 2023-06-23 | Discharge: 2023-06-23 | Disposition: A | Discharge status: Home / Self Care | Payer: Medicare (Managed Care) | Source: Ambulatory Visit | Attending: Cardiology | Admitting: Cardiology

## 2023-06-23 DIAGNOSIS — E041 Nontoxic single thyroid nodule: Secondary | ICD-10-CM | POA: Insufficient documentation

## 2023-06-23 DIAGNOSIS — R0609 Other forms of dyspnea: Secondary | ICD-10-CM | POA: Insufficient documentation

## 2023-06-24 LAB — ECHOCARDIOGRAM COMPLETE
AR max vel: 2.84 cm2
AV Area VTI: 3.11 cm2
AV Area mean vel: 3.09 cm2
AV Mean grad: 5 mm[Hg]
AV Peak grad: 9.6 mm[Hg]
Ao pk vel: 1.55 m/s
Area-P 1/2: 4.63 cm2
Calc EF: 60.7 %
P 1/2 time: 2036 ms
S' Lateral: 2.4 cm
Single Plane A2C EF: 62.9 %
Single Plane A4C EF: 60.1 %

## 2023-06-26 ENCOUNTER — Other Ambulatory Visit: Payer: Self-pay | Admitting: Family Medicine

## 2023-06-29 ENCOUNTER — Encounter: Payer: Self-pay | Admitting: Behavioral Health

## 2023-06-29 ENCOUNTER — Ambulatory Visit (INDEPENDENT_AMBULATORY_CARE_PROVIDER_SITE_OTHER): Payer: Medicare (Managed Care) | Admitting: Behavioral Health

## 2023-06-29 ENCOUNTER — Telehealth: Payer: Medicare (Managed Care) | Admitting: Behavioral Health

## 2023-06-29 DIAGNOSIS — F419 Anxiety disorder, unspecified: Secondary | ICD-10-CM | POA: Diagnosis not present

## 2023-06-29 DIAGNOSIS — F411 Generalized anxiety disorder: Secondary | ICD-10-CM | POA: Diagnosis not present

## 2023-06-29 DIAGNOSIS — F32A Depression, unspecified: Secondary | ICD-10-CM | POA: Diagnosis not present

## 2023-06-29 DIAGNOSIS — F339 Major depressive disorder, recurrent, unspecified: Secondary | ICD-10-CM

## 2023-06-29 DIAGNOSIS — F319 Bipolar disorder, unspecified: Secondary | ICD-10-CM

## 2023-06-29 DIAGNOSIS — F3130 Bipolar disorder, current episode depressed, mild or moderate severity, unspecified: Secondary | ICD-10-CM | POA: Diagnosis not present

## 2023-06-29 DIAGNOSIS — F410 Panic disorder [episodic paroxysmal anxiety] without agoraphobia: Secondary | ICD-10-CM

## 2023-06-29 DIAGNOSIS — F331 Major depressive disorder, recurrent, moderate: Secondary | ICD-10-CM

## 2023-06-29 MED ORDER — LURASIDONE HCL 120 MG PO TABS
1.0000 | ORAL_TABLET | Freq: Every day | ORAL | 3 refills | Status: DC
Start: 2023-06-29 — End: 2023-10-19

## 2023-06-29 MED ORDER — QUETIAPINE FUMARATE 300 MG PO TABS
300.0000 mg | ORAL_TABLET | Freq: Every day | ORAL | 3 refills | Status: DC
Start: 2023-06-29 — End: 2023-07-18

## 2023-06-29 MED ORDER — ESCITALOPRAM OXALATE 10 MG PO TABS
10.0000 mg | ORAL_TABLET | Freq: Every day | ORAL | 1 refills | Status: DC
Start: 2023-06-29 — End: 2023-07-18

## 2023-06-29 MED ORDER — BUPROPION HCL ER (XL) 300 MG PO TB24
300.0000 mg | ORAL_TABLET | Freq: Every day | ORAL | 3 refills | Status: DC
Start: 2023-06-29 — End: 2023-08-17

## 2023-06-29 MED ORDER — LORAZEPAM 0.5 MG PO TABS
0.5000 mg | ORAL_TABLET | Freq: Three times a day (TID) | ORAL | 1 refills | Status: DC
Start: 2023-06-29 — End: 2023-07-18

## 2023-06-29 NOTE — Progress Notes (Signed)
[facility] Counselor/Therapist Progress Note  Patient ID: Brettney Sanderlin, MRN: [id number],    Date: 06/29/2023  Time Spent: 58 minutes, 2:00 PM  to 2:58 spent with the patient.This session was held via video teletherapy. The patient consented to the video teletherapy and was located in her home during this session. She is aware it is the responsibility of the patient to secure confidentiality on her end of the session. The provider was in a private home  for the duration of this session.      Treatment Type: Individual Therapy  Reported Symptoms: Anxiety, depression  Mental Status Exam: Appearance:  Casual     Behavior: Appropriate  Motor: Normal  Speech/Language:  Normal Rate  Affect: Appropriate  Mood: normal  Thought process: normal  Thought content:   WNL  Sensory/Perceptual disturbances:   WNL  Orientation: oriented to person, place, time/date, situation, day of week, month of year, and year  Attention: Good  Concentration: Good  Memory: WNL  Fund of knowledge:  Good  Insight:   Good  Judgment:  Good  Impulse Control: Good   Risk Assessment: Danger to Self:  No Self-injurious Behavior: No Danger to Others: No Duty to Warn:no Physical Aggression / Violence:No  Access to Firearms a concern: No  Gang Involvement:No   Subjective: The patient did meet with her psychiatrist and they are going to start Lexapro and Ativan tomorrow because the Viibryd was not promoting any mood elevation.  She told him and we processed that she had a manic episode a few days ago and spent significant amount of money.  It is not things that she will use for her doll making outfit but not money that she needed to spend.  She did send some of the things back.  We talked about ways that she could reduce the temptation especially in a manic phase such as delaying the Her Phone, How to Distract Herself, Cognitive Reframing Etc.  She Has Come Out Of the Manic Episode  and She and the Doctor Optimistic the New Medication Will Help Stabilize That.  She Is Scheduled for a Thyroid Biopsy on the 31st.  She Is Not Necessarily Anxious about the Procedure but Anxious about What They Might Find.  The Ear Nose and Throat Specialist Could Not Say without Actually Having a Biopsy What That Might Look like.  She Said She Would Hear within 2 Weeks the Results of That Biopsy.  We Talked about Compartmentalization and Not Projecting.  She Is At [facility] Experiencing a Reduced Heart Rate so the Palpitations Although Still Present Are Not As Intense but Still Uncomfortable.  It Is Complicated by the Environment That She Is Living in.  She Is Having to Feed Her Dog Upstairs Because Her Daughter's Dogs Are Taking Her Dog's Dog Food Which Is Specially for Him.  She Is Trying to Get outside As Regularly As She Can to Sit outside with Her Dog since It Is Nice but It Takes a Significant Amount of Energy to Get up and down the Stairs.  Her Daughter Reminded Her As Did I to Practice Her Coping Skills and We Reviewed Those Even When the Anxiety Is Manageable so That They Become a Natural Part of Her Response to Anxiety When She Needs It.  Plan: I will meet with the patient weekly via care agility.  Treatment plan: We will use cognitive behavioral therapy as well as person centered and supportive therapy in addition to  elements of dialectical behavior therapy to help reduce the patient's anxiety and depression by at least 50% with a target date of October 06, 2023.  Goals for improving depression may include having less sadness as indicated by patient report and scores on the PHQ-9, have improved mood and return to a healthier level of functioning, identify causes including environment for depressed mood and learn ways to cope with depression especially those connected to her medical issues.  Interventions include using cognitive behavioral therapy to explore and replace thoughts and behaviors.  We  will look at how depression is experienced in day-to-day living and encouraged sharing of feelings.  We will encourage the use of coping skills for management of depressive symptoms.  Goals for reducing anxiety are to improve her ability to manage anxiety symptoms, better handle stress, identify causes for anxiety and explore ways to lower it, resolve the core conflicts contributing to anxiety as well as manage thoughts and worrisome thinking contributing to feelings of anxiety.  Interventions will include providing education about anxiety, facilitate problem solution skills to help her identify options for resolving stress, teach coping skills for managing anxiety as well as mindfulness and communication in terms of family to reduce her stress level, use cognitive behavior therapy to identify and change anxiety provoking thought and behavior patterns as well as teach distress tolerance and mindfulness skills for alleviating anxiety. Progress: 30%  [name], [facility]                  [name], [facility]               [name], [facility]               [name], [facility]               [name], [facility]               [name], [facility]               [name], [facility]               [name], [facility]               [name], [facility]               [name], [facility]

## 2023-06-29 NOTE — Progress Notes (Deleted)
[facility] Check  Patient ID: Paula Stout,  MRN: [id number]  PCP: Sharlene Dory, DO  Date of Evaluation: 06/29/2023 Time spent:30 minutes  Chief Complaint:   HISTORY/CURRENT STATUS: HPI  Individual Medical History/ Review of Systems: Changes? :{EXAM; YES/NO:21197}  Allergies: Bee venom, Onion, Tetanus-diphtheria toxoids td, Sumatriptan, Zolpidem, Asa [aspirin], Iodine, Nsaids, Sulfa antibiotics, and Vancomycin  Current Medications:  Current Outpatient Medications:    acetaminophen (TYLENOL) 500 MG tablet, Take 1,000 mg by mouth every 6 (six) hours as needed for mild pain., Disp: , Rfl:    albuterol (VENTOLIN HFA) 108 (90 Base) MCG/ACT inhaler, Inhale 2 puffs into the lungs every 4 (four) hours as needed for wheezing., Disp: 1 each, Rfl: 2   Ascorbic Acid (VITAMIN C PO), Take 1 tablet by mouth daily., Disp: , Rfl:    atorvastatin (LIPITOR) 80 MG tablet, TAKE 1 TABLET DAILY, Disp: 90 tablet, Rfl: 3   blood glucose meter kit and supplies, One Touch Ultra  Check 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 One Touch Ultra  Check blood sugars once daily Dx: E11.9), Disp: 1 each, Rfl: 0   botulinum toxin Type A (BOTOX) 200 units injection, Inject 155 units IM into multiple site in the face,neck and head once every 90 days (Patient taking differently: Inject 155 Units into the muscle See admin instructions. Inject 155 units IM into multiple site in the face,neck and head once every 90 days), Disp: 1 each, Rfl: 4   buPROPion (WELLBUTRIN XL) 300 MG 24 hr tablet, TAKE 1 TABLET DAILY, Disp: 90 tablet, Rfl: 3   Cholecalciferol (VITAMIN D3) 125 MCG (5000 UT) capsule, Take 5,000 Units by mouth daily., Disp: , Rfl:    Cyanocobalamin (VITAMIN B-12) 5000 MCG TBDP, Take 5,000 mcg by mouth daily., Disp: , Rfl:    dapagliflozin propanediol (FARXIGA) 10 MG TABS tablet, Take 1 tablet (10 mg total) by mouth daily., Disp: 90  tablet, Rfl: 1   diltiazem (CARDIZEM CD) 240 MG 24 hr capsule, Take 1 capsule (240 mg total) by mouth daily., Disp: 30 capsule, Rfl: 6   diltiazem (CARDIZEM) 30 MG tablet, Take 1 tablet every 4 hours AS NEEDED for AFIB or SVT heart rate >95 as long as top BP >100. (Patient taking differently: Take 30 mg by mouth See admin instructions. Take 1 tablet every 4 hours AS NEEDED for AFIB or SVT heart rate >95 as long as top BP >100.), Disp: 30 tablet, Rfl: 1   ELIQUIS 5 MG TABS tablet, TAKE 1 TABLET TWICE A DAY, Disp: 180 tablet, Rfl: 3   EPINEPHrine 0.3 mg/0.3 mL IJ SOAJ injection, Inject 0.3 mg into the muscle as needed for anaphylaxis., Disp: 1 each, Rfl: 1   ezetimibe (ZETIA) 10 MG tablet, TAKE 1 TABLET DAILY, Disp: 90 tablet, Rfl: 3   fenofibrate 160 MG tablet, Take 1 tablet (160 mg total) by mouth daily., Disp: 90 tablet, Rfl: 3   fluconazole (DIFLUCAN) 150 MG tablet, Take 1 tab, repeat in 72 hours if no improvement., Disp: 2 tablet, Rfl: 0   fluticasone (FLONASE) 50 MCG/ACT nasal spray, Place 2 sprays into both nostrils daily. (Patient taking differently: Place 2 sprays into both nostrils 3 (three) times a week.), Disp: 16 g, Rfl: 6   levocetirizine (XYZAL) 5 MG tablet, TAKE 1 TABLET EVERY EVENING (Patient taking differently: Take 5 mg by mouth every evening.), Disp: 90 tablet, Rfl: 3   Lurasidone HCl 120 MG TABS, Take  1 tablet (120 mg total) by mouth at bedtime., Disp: 90 tablet, Rfl: 3   magnesium oxide (MAG-OX) 400 (240 Mg) MG tablet, Take 400 mg by mouth daily., Disp: , Rfl:    metFORMIN (GLUCOPHAGE-XR) 500 MG 24 hr tablet, TAKE 1 TABLET TWICE A DAY WITH MEALS (DECREASED IN DOSE), Disp: 180 tablet, Rfl: 3   metoprolol succinate (TOPROL-XL) 100 MG 24 hr tablet, Take 1 tablet (100 mg total) by mouth 2 (two) times daily. Take with or immediately following a meal., Disp: 180 tablet, Rfl: 3   metoprolol tartrate (LOPRESSOR) 25 MG tablet, Take 1 tablet (25 mg total) by mouth every 4 (four) hours as  needed (PALPITATIONS)., Disp: 90 tablet, Rfl: 3   Multiple Vitamins-Minerals (MULTIVITAMIN WITH MINERALS) tablet, Take 1 tablet by mouth daily., Disp: , Rfl:    nitroGLYCERIN (NITROSTAT) 0.4 MG SL tablet, Place 1 tablet (0.4 mg total) under the tongue every 5 (five) minutes as needed for chest pain., Disp: 25 tablet, Rfl: 11   ondansetron (ZOFRAN-ODT) 4 MG disintegrating tablet, Take 1 tablet (4 mg total) by mouth every 8 (eight) hours as needed for nausea or vomiting., Disp: 20 tablet, Rfl: 0   ONETOUCH VERIO test strip, USE DAILY TO CHECK BLOOD SUGAR (Patient taking differently: 1 each by Other route as needed for other (see below). Use daily to check blood sugar.), Disp: 100 strip, Rfl: 3   pantoprazole (PROTONIX) 40 MG tablet, Take 1 tablet (40 mg total) by mouth daily., Disp: 90 tablet, Rfl: 3   QUEtiapine (SEROQUEL) 300 MG tablet, Take 1 tablet (300 mg total) by mouth at bedtime., Disp: 90 tablet, Rfl: 3   Rimegepant Sulfate (NURTEC) 75 MG TBDP, Take 1 tablet (75 mg total) by mouth as needed (take 1 tab at the earlist onset of a migraine. Max 1 tab in 24 hours)., Disp: 10 tablet, Rfl: 5   tirzepatide (MOUNJARO) 10 MG/0.5ML Pen, Inject 10 mg into the skin once a week for 28 days., Disp: 2 mL, Rfl: 0   [START ON 07/26/2023] tirzepatide (MOUNJARO) 12.5 MG/0.5ML Pen, Inject 12.5 mg into the skin once a week., Disp: 2 mL, Rfl: 0   topiramate (TOPAMAX) 50 MG tablet, TAKE 3 TABLETS TWICE A DAY (Patient taking differently: Take 150 mg by mouth 2 (two) times daily.), Disp: 180 tablet, Rfl: 11   Vilazodone HCl (VIIBRYD) 40 MG TABS, Take 1 tablet (40 mg total) by mouth daily., Disp: 30 tablet, Rfl: 3 Medication Side Effects: {Medication Side Effects (Optional):12147}  Family Medical/ Social History: Changes? {EXAM; YES/NO:19492}  MENTAL HEALTH EXAM:  There were no vitals taken for this visit.There is no height or weight on file to calculate BMI.  General Appearance: {PSY:[phone]}  Eye Contact:   {PSY:22684}  Speech:  {[phone]}  Volume:  {PSY:22686}  Mood:  {PSY:22306}  Affect:  {PSY:[phone]}  Thought Process:  {PSY:22688}  Orientation:  {PSY:22689}  Thought Content: {PSYt:22690}   Suicidal Thoughts:  {PSY:22692}  Homicidal Thoughts:  {PSY:22692}  Memory:  {PSY:[phone]}  Judgement:  {PSY:22694}  Insight:  {PSY:22695}  Psychomotor Activity:  {PSY:22696}  Concentration:  {PSY:21399}  Recall:  {PSY:22877}  Fund of Knowledge: {PSY:22877}  Language: {ION:62952}  Assets:  {PSY:22698}  ADL's:  {PSY:22290}  Cognition: {PSY:[id number]}  Prognosis:  {PSY:22877}    DIAGNOSES: No diagnosis found.  Receiving Psychotherapy: {WUX:32440}   RECOMMENDATIONS: ***   Joan Flores, NP

## 2023-06-29 NOTE — Progress Notes (Signed)
Paula Stout [id number] 1956-01-15 67 y.o.  Virtual Visit via Video Note  I connected with pt @ on 06/29/23 at 11:00 AM EDT by a video enabled telemedicine application and verified that I am speaking with the correct person using two identifiers.   I discussed the limitations of evaluation and management by telemedicine and the availability of in person appointments. The patient expressed understanding and agreed to proceed.  I discussed the assessment and treatment plan with the patient. The patient was provided an opportunity to ask questions and all were answered. The patient agreed with the plan and demonstrated an understanding of the instructions.   The patient was advised to call back or seek an in-person evaluation if the symptoms worsen or if the condition fails to improve as anticipated.  I provided 30 minutes of non-face-to-face time during this encounter.  The patient was located at home.  The provider was located at [facility].   Joan Flores, NP   Subjective:   Patient ID:  Paula Stout is a 67 y.o. (DOB [birth year]) female.  Chief Complaint:  Chief Complaint  Patient presents with   Anxiety   Depression   Manic Behavior   Follow-up   Medication Problem   Medication Refill   Patient Education   Panic Attack    HPI "Paula Stout", 67 year old female presents to this office via video visit for follow up and medication management.  Collateral information should be considered reliable.  Says she is not doing well right now. Increase anxiety due to diagnostic testing revealing nodules on thyroid and lungs. Is continuing to follow up with specialist. Feels like Viibryd is not working and would like to switch to another medication for depression and anxiety.     Did experience some manic behavior two weeks ago and went on "spending spree". Says she is aware and feels remorse afterwards. She reports her depression today at 4/10, and anxiety at 6/10.  She is  sleeping 7 to 8 hours per night with the aid of Seroquel.  She denies any current mania, no history of psychosis or auditory or visual hallucinations.  No SI or HI.  Patient states that she feels safe and verbally contracts for safety today.   Past psychiatric medication trials: Zoloft Prozac Cymbalta Lithium Depakote Valium Ativan Gabapentin Trazodone Ambien     Review of Systems:  Review of Systems  Constitutional: Negative.   Allergic/Immunologic: Negative.   Psychiatric/Behavioral:  Positive for dysphoric mood. The patient is nervous/anxious.     Medications: I have reviewed the patient's current medications.  Current Outpatient Medications  Medication Sig Dispense Refill   escitalopram (LEXAPRO) 10 MG tablet Take 1 tablet (10 mg total) by mouth daily. 30 tablet 1   LORazepam (ATIVAN) 0.5 MG tablet Take 1 tablet (0.5 mg total) by mouth every 8 (eight) hours. 30 tablet 1   acetaminophen (TYLENOL) 500 MG tablet Take 1,000 mg by mouth every 6 (six) hours as needed for mild pain.     albuterol (VENTOLIN HFA) 108 (90 Base) MCG/ACT inhaler Inhale 2 puffs into the lungs every 4 (four) hours as needed for wheezing. 1 each 2   Ascorbic Acid (VITAMIN C PO) Take 1 tablet by mouth daily.     atorvastatin (LIPITOR) 80 MG tablet TAKE 1 TABLET DAILY 90 tablet 3   blood glucose meter kit and supplies One Touch Ultra  Check 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 One Touch Ultra  Check blood sugars once daily Dx: E11.9) 1 each 0   botulinum toxin Type A (BOTOX) 200 units injection Inject 155 units IM into multiple site in the face,neck and head once every 90 days (Patient taking differently: Inject 155 Units into the muscle See admin instructions. Inject 155 units IM into multiple site in the face,neck and head once every 90 days) 1 each 4   buPROPion (WELLBUTRIN XL) 300 MG 24 hr tablet Take 1 tablet (300 mg total) by mouth  daily. 90 tablet 3   Cholecalciferol (VITAMIN D3) 125 MCG (5000 UT) capsule Take 5,000 Units by mouth daily.     Cyanocobalamin (VITAMIN B-12) 5000 MCG TBDP Take 5,000 mcg by mouth daily.     dapagliflozin propanediol (FARXIGA) 10 MG TABS tablet Take 1 tablet (10 mg total) by mouth daily. 90 tablet 1   diltiazem (CARDIZEM CD) 240 MG 24 hr capsule Take 1 capsule (240 mg total) by mouth daily. 30 capsule 6   diltiazem (CARDIZEM) 30 MG tablet Take 1 tablet every 4 hours AS NEEDED for AFIB or SVT heart rate >95 as long as top BP >100. (Patient taking differently: Take 30 mg by mouth See admin instructions. Take 1 tablet every 4 hours AS NEEDED for AFIB or SVT heart rate >95 as long as top BP >100.) 30 tablet 1   ELIQUIS 5 MG TABS tablet TAKE 1 TABLET TWICE A DAY 180 tablet 3   EPINEPHrine 0.3 mg/0.3 mL IJ SOAJ injection Inject 0.3 mg into the muscle as needed for anaphylaxis. 1 each 1   ezetimibe (ZETIA) 10 MG tablet TAKE 1 TABLET DAILY 90 tablet 3   fenofibrate 160 MG tablet Take 1 tablet (160 mg total) by mouth daily. 90 tablet 3   fluconazole (DIFLUCAN) 150 MG tablet Take 1 tab, repeat in 72 hours if no improvement. 2 tablet 0   fluticasone (FLONASE) 50 MCG/ACT nasal spray Place 2 sprays into both nostrils daily. (Patient taking differently: Place 2 sprays into both nostrils 3 (three) times a week.) 16 g 6   levocetirizine (XYZAL) 5 MG tablet TAKE 1 TABLET EVERY EVENING (Patient taking differently: Take 5 mg by mouth every evening.) 90 tablet 3   Lurasidone HCl 120 MG TABS Take 1 tablet (120 mg total) by mouth at bedtime. 90 tablet 3   magnesium oxide (MAG-OX) 400 (240 Mg) MG tablet Take 400 mg by mouth daily.     metFORMIN (GLUCOPHAGE-XR) 500 MG 24 hr tablet TAKE 1 TABLET TWICE A DAY WITH MEALS (DECREASED IN DOSE) 180 tablet 3   metoprolol succinate (TOPROL-XL) 100 MG 24 hr tablet Take 1 tablet (100 mg total) by mouth 2 (two) times daily. Take with or immediately following a meal. 180 tablet 3    metoprolol tartrate (LOPRESSOR) 25 MG tablet Take 1 tablet (25 mg total) by mouth every 4 (four) hours as needed (PALPITATIONS). 90 tablet 3   Multiple Vitamins-Minerals (MULTIVITAMIN WITH MINERALS) tablet Take 1 tablet by mouth daily.     nitroGLYCERIN (NITROSTAT) 0.4 MG SL tablet Place 1 tablet (0.4 mg total) under the tongue every 5 (five) minutes as needed for chest pain. 25 tablet 11   ondansetron (ZOFRAN-ODT) 4 MG disintegrating tablet Take 1 tablet (4 mg total) by mouth every 8 (eight) hours as needed for nausea or vomiting. 20 tablet 0   ONETOUCH VERIO test strip USE DAILY TO CHECK BLOOD SUGAR (Patient taking differently: 1 each by Other route as needed for other (see below). Use daily to  check blood sugar.) 100 strip 3   pantoprazole (PROTONIX) 40 MG tablet Take 1 tablet (40 mg total) by mouth daily. 90 tablet 3   QUEtiapine (SEROQUEL) 300 MG tablet Take 1 tablet (300 mg total) by mouth at bedtime. 90 tablet 3   Rimegepant Sulfate (NURTEC) 75 MG TBDP Take 1 tablet (75 mg total) by mouth as needed (take 1 tab at the earlist onset of a migraine. Max 1 tab in 24 hours). 10 tablet 5   tirzepatide (MOUNJARO) 10 MG/0.5ML Pen Inject 10 mg into the skin once a week for 28 days. 2 mL 0   [START ON 07/26/2023] tirzepatide (MOUNJARO) 12.5 MG/0.5ML Pen Inject 12.5 mg into the skin once a week. 2 mL 0   topiramate (TOPAMAX) 50 MG tablet TAKE 3 TABLETS TWICE A DAY (Patient taking differently: Take 150 mg by mouth 2 (two) times daily.) 180 tablet 11   No current facility-administered medications for this visit.    Medication Side Effects: None  Allergies:  Allergies  Allergen Reactions   Bee Venom Anaphylaxis   Onion Other (See Comments)    Respiratory Distress   Tetanus-Diphtheria Toxoids Td Anaphylaxis   Sumatriptan     Passed out, nose bleed    Zolpidem Other (See Comments)    Causes sleep walking   Asa [Aspirin] Nausea And Vomiting   Iodine Rash   Nsaids Nausea Only    Rash, hives and  trouble breathing   Sulfa Antibiotics Rash   Vancomycin Rash    Other Reaction(s): STEVENS-JOHNSON    Past Medical History:  Diagnosis Date   ACS (acute coronary syndrome) (HCC) 07/31/2021   Adhesive capsulitis of left shoulder 12/16/2016   Asthma without status asthmaticus 02/06/2021   At risk for falls 02/04/2019   Atrial fibrillation (HCC) 09/01/2014   Last Assessment & Plan:  Formatting of this note might be different from the original. A:  Chronic.  Sinus rhythm at this time.  States she was told this during admission at [facility] in the past. P:  On baby aspirin at home will continue.  Low dose metoprolol started.   Bipolar 1 disorder, depressed, moderate (HCC) 02/18/2020   Capsulitis of left shoulder 10/20/2021   Chronic anticoagulation 02/20/2020   Chronic atrial fibrillation (HCC) 02/18/2020   Chronic headache 10/11/2013   Chronic interstitial cystitis 12/28/2005   Chronic migraine without aura, intractable, without status migrainosus 06/24/2017   Colon cancer screening 04/15/2016   Last Assessment & Plan:  Formatting of this note might be different from the original. Will schedule for colonoscopy   Depression, recurrent (HCC) 02/18/2020   Diabetes mellitus (HCC)    Dyslipidemia 02/20/2020   GAD (generalized anxiety disorder) 02/18/2020   GERD (gastroesophageal reflux disease) 11/05/2021   History of atrial fibrillation 06/24/2017   History of posttraumatic stress disorder (PTSD) 10/07/2016   Hyperosmolarity due to secondary diabetes mellitus (HCC) 02/06/2021   Impaired mobility and activities of daily living 11/14/2015   Irritable bowel syndrome (IBS)    Junctional tachycardia (HCC) 11/05/2021   Kidney stone 02/06/2021   Migraine without aura, not refractory 02/06/2021   Mixed hyperlipidemia 04/06/2021   Last Assessment & Plan:  Formatting of this note might be different from the original. Due for labs with PCP in November   Morbid obesity (HCC) 12/16/2016   Paroxysmal  supraventricular tachycardia (HCC) 11/05/2021   Pneumonia, organism unspecified(486) 11/21/2002   Polyneuropathy due to type 2 diabetes mellitus (HCC) 02/06/2021   Primary hypertension 04/06/2021   Last Assessment &  Plan:  Formatting of this note might be different from the original. Controlled   Refractory migraine with aura 02/06/2021   Refractory migraine without aura 02/06/2021   Renal colic on left side 04/22/2017   Stage 3 chronic kidney disease (HCC) 02/20/2020   Unspecified adverse effect of other drug, medicinal and biological substance(995.29) 02/17/2006   Urinary tract obstruction 02/06/2021    Family History  Adopted: Yes  Problem Relation Age of Onset   Migraines Daughter    Bipolar disorder Daughter    Migraines Daughter    Migraines Son    Bipolar disorder Son     Social History   Socioeconomic History   Marital status: Widowed    Spouse name: Not on file   Number of children: 6   Years of education: Not on file   Highest education level: Associate degree: academic program  Occupational History   Occupation: not employed  Tobacco Use   Smoking status: Never   Smokeless tobacco: Never   Tobacco comments:    Never smoke 07/15/22  Vaping Use   Vaping status: Never Used  Substance and Sexual Activity   Alcohol use: Never    Comment: light social   Drug use: Never   Sexual activity: Not Currently  Other Topics Concern   Not on file  Social History Narrative   Lives in [place] with daughter and son-in-law.  Sews clothing in free time as hobby.    Social Determinants of Health   Financial Resource Strain: Medium Risk (02/16/2023)   Overall Financial Resource Strain (CARDIA)    Difficulty of Paying Living Expenses: Somewhat hard  Food Insecurity: No Food Insecurity (02/16/2023)   Hunger Vital Sign    Worried About Running Out of Food in the Last Year: Never true    Ran Out of Food in the Last Year: Never true  Transportation Needs: No Transportation  Needs (02/16/2023)   PRAPARE - Administrator, Civil Service (Medical): No    Lack of Transportation (Non-Medical): No  Physical Activity: Unknown (02/16/2023)   Exercise Vital Sign    Days of Exercise per Week: 2 days    Minutes of Exercise per Session: Patient declined  Stress: Stress Concern Present (02/16/2023)   [employer] of Occupational Health - Occupational Stress Questionnaire    Feeling of Stress : To some extent  Social Connections: Socially Isolated (02/16/2023)   Social Connection and Isolation Panel [NHANES]    Frequency of Communication with Friends and Family: More than three times a week    Frequency of Social Gatherings with Friends and Family: Never    Attends Religious Services: Never    Database administrator or Organizations: No    Attends Engineer, structural: Not on file    Marital Status: Widowed  Intimate Partner Violence: Not At Risk (10/27/2022)   Humiliation, Afraid, Rape, and Kick questionnaire    Fear of Current or Ex-Partner: No    Emotionally Abused: No    Physically Abused: No    Sexually Abused: No    Past Medical History, Surgical history, Social history, and Family history were reviewed and updated as appropriate.   Please see review of systems for further details on the patient's review from today.   Objective:   Physical Exam:  There were no vitals taken for this visit.  Physical Exam Neurological:     Mental Status: She is alert and oriented to person, place, and time.  Psychiatric:  Attention and Perception: Attention and perception normal.        Mood and Affect: Mood normal.        Speech: Speech normal.        Behavior: Behavior normal. Behavior is cooperative.        Cognition and Memory: Cognition and memory normal.        Judgment: Judgment normal.     Comments: Insight intact     Lab Review:     Component Value Date/Time   NA 140 06/01/2023 1751   NA 141 04/20/2023 1541   K 4.1  06/01/2023 1751   CL 113 (H) 06/01/2023 1751   CO2 22 06/01/2023 1751   GLUCOSE 133 (H) 06/01/2023 1751   BUN 17 06/01/2023 1751   BUN 14 04/20/2023 1541   CREATININE 1.88 (H) 06/01/2023 1751   CALCIUM 9.1 06/01/2023 1751   PROT 7.0 06/01/2023 1751   PROT 6.7 02/10/2021 0914   ALBUMIN 3.5 06/01/2023 1751   ALBUMIN 3.9 02/10/2021 0914   AST 20 06/01/2023 1751   ALT 14 06/01/2023 1751   ALKPHOS 51 06/01/2023 1751   BILITOT 0.5 06/01/2023 1751   BILITOT 0.3 02/10/2021 0914   GFRNONAA 29 (L) 06/01/2023 1751   GFRAA 39 (L) 04/22/2020 1726       Component Value Date/Time   WBC 6.8 06/01/2023 1751   RBC 3.69 (L) 06/01/2023 1751   HGB 11.3 (L) 06/01/2023 1751   HGB 12.4 04/20/2023 1537   HCT 35.1 (L) 06/01/2023 1751   HCT 38.3 04/20/2023 1537   PLT 264 06/01/2023 1751   PLT 324 04/20/2023 1537   MCV 95.1 06/01/2023 1751   MCV 92 04/20/2023 1537   MCH 30.6 06/01/2023 1751   MCHC 32.2 06/01/2023 1751   RDW 16.7 (H) 06/01/2023 1751   RDW 13.7 04/20/2023 1537   LYMPHSABS 2.1 06/01/2023 1751   LYMPHSABS 1.9 10/13/2022 1002   MONOABS 0.6 06/01/2023 1751   EOSABS 0.3 06/01/2023 1751   EOSABS 0.3 10/13/2022 1002   BASOSABS 0.1 06/01/2023 1751   BASOSABS 0.1 10/13/2022 1002    No results found for: "POCLITH", "LITHIUM"   No results found for: "PHENYTOIN", "PHENOBARB", "VALPROATE", "CBMZ"   .res Assessment: Plan:    Greater than 50% of 30 min video visit time with patient was spent on counseling and coordination of care. We discussed her recent health concerns as well as her medications. Talked about her concerns that Viibryd is not working and she is to the point where she would like to try another medication. Patient has history of SVTs.  She had   cardiac ablation on August 28.  Still having problems reports intermittent chest pain with activity. She is currently taking higher doses of metoprolol to mediate cardiac condition.  Also has history of A-Fib.  Advised patient to f/u  and  clear any new medication with cardiologist.   We agreed today to:  Will continue Seroquel 300 mg at bedtime daily Will continue Latuda 120 mg daily To continue Wellbutrin 300 mg daily Will reduce Viibryd to 20 mg for one week, then 10 mg for one week, then stop To start Lexapro 10 mg daily after breakfast  Will report worsening symptoms promptly Provided emergency contact information Discussed potential metabolic side effects associated with atypical antipsychotics, as well as potential risk for movement side effects. Advised pt to contact office if movement side effects occur.   Reviewed PDMP   Watt Climes. Cliffton Asters, NP   Aggie Cosier "Paula Stout" was seen  today for anxiety, depression, manic behavior, follow-up, medication problem, medication refill, patient education and panic attack.  Diagnoses and all orders for this visit:  Bipolar I disorder, most recent episode depressed (HCC) -     escitalopram (LEXAPRO) 10 MG tablet; Take 1 tablet (10 mg total) by mouth daily. -     LORazepam (ATIVAN) 0.5 MG tablet; Take 1 tablet (0.5 mg total) by mouth every 8 (eight) hours. -     Lurasidone HCl 120 MG TABS; Take 1 tablet (120 mg total) by mouth at bedtime.  Generalized anxiety disorder -     escitalopram (LEXAPRO) 10 MG tablet; Take 1 tablet (10 mg total) by mouth daily. -     LORazepam (ATIVAN) 0.5 MG tablet; Take 1 tablet (0.5 mg total) by mouth every 8 (eight) hours.  Panic attack  Depression, recurrent (HCC) -     buPROPion (WELLBUTRIN XL) 300 MG 24 hr tablet; Take 1 tablet (300 mg total) by mouth daily.  Bipolar 1 disorder (HCC) -     QUEtiapine (SEROQUEL) 300 MG tablet; Take 1 tablet (300 mg total) by mouth at bedtime.     Please see After Visit Summary for patient specific instructions.  Future Appointments  Date Time Provider Department Center  07/06/2023  2:00 PM [name], [facility] None  07/18/2023  1:30 PM Joan Flores, NP CP-CP None  07/19/2023  2:00 PM  Georgeanna Lea, MD [facility] None  07/27/2023 10:30 AM [facility] 1 [facility]  07/28/2023 11:30 AM Shon Millet R, DO [facility] None  08/08/2023 10:00 AM [facility] PHARMACIST [facility]  08/11/2023  9:30 AM Cobb, Ruby Cola, NP [facility] None  08/19/2023 10:50 AM Drema Dallas, DO [facility] None  09/26/2023  3:30 PM Regan Lemming, MD [facility] None  11/28/2023 10:45 AM Wendling, Jilda Roche, DO [facility]    No orders of the defined types were placed in this encounter.     -------------------------------

## 2023-06-30 ENCOUNTER — Other Ambulatory Visit: Payer: Self-pay

## 2023-06-30 ENCOUNTER — Encounter (HOSPITAL_BASED_OUTPATIENT_CLINIC_OR_DEPARTMENT_OTHER): Payer: Self-pay | Admitting: Emergency Medicine

## 2023-06-30 ENCOUNTER — Emergency Department (HOSPITAL_BASED_OUTPATIENT_CLINIC_OR_DEPARTMENT_OTHER)
Admission: EM | Admit: 2023-06-30 | Discharge: 2023-06-30 | Disposition: A | Discharge status: Home / Self Care | Payer: Medicare (Managed Care) | Attending: Emergency Medicine | Admitting: Emergency Medicine

## 2023-06-30 DIAGNOSIS — N189 Chronic kidney disease, unspecified: Secondary | ICD-10-CM | POA: Insufficient documentation

## 2023-06-30 DIAGNOSIS — E1122 Type 2 diabetes mellitus with diabetic chronic kidney disease: Secondary | ICD-10-CM | POA: Insufficient documentation

## 2023-06-30 DIAGNOSIS — Z7901 Long term (current) use of anticoagulants: Secondary | ICD-10-CM | POA: Insufficient documentation

## 2023-06-30 DIAGNOSIS — G43901 Migraine, unspecified, not intractable, with status migrainosus: Secondary | ICD-10-CM | POA: Insufficient documentation

## 2023-06-30 DIAGNOSIS — Z7984 Long term (current) use of oral hypoglycemic drugs: Secondary | ICD-10-CM | POA: Diagnosis not present

## 2023-06-30 DIAGNOSIS — I129 Hypertensive chronic kidney disease with stage 1 through stage 4 chronic kidney disease, or unspecified chronic kidney disease: Secondary | ICD-10-CM | POA: Diagnosis not present

## 2023-06-30 DIAGNOSIS — R519 Headache, unspecified: Secondary | ICD-10-CM | POA: Diagnosis present

## 2023-06-30 DIAGNOSIS — Z79899 Other long term (current) drug therapy: Secondary | ICD-10-CM | POA: Diagnosis not present

## 2023-06-30 DIAGNOSIS — G43909 Migraine, unspecified, not intractable, without status migrainosus: Secondary | ICD-10-CM | POA: Diagnosis not present

## 2023-06-30 LAB — BASIC METABOLIC PANEL
Anion gap: 9 (ref 5–15)
BUN: 25 mg/dL — ABNORMAL HIGH (ref 8–23)
CO2: 19 mmol/L — ABNORMAL LOW (ref 22–32)
Calcium: 9.2 mg/dL (ref 8.9–10.3)
Chloride: 112 mmol/L — ABNORMAL HIGH (ref 98–111)
Creatinine, Ser: 2.03 mg/dL — ABNORMAL HIGH (ref 0.44–1.00)
GFR, Estimated: 27 mL/min — ABNORMAL LOW (ref >60)
Glucose, Bld: 132 mg/dL — ABNORMAL HIGH (ref 70–99)
Potassium: 4 mmol/L (ref 3.5–5.1)
Sodium: 140 mmol/L (ref 135–145)

## 2023-06-30 LAB — URINALYSIS, ROUTINE W REFLEX MICROSCOPIC
Bilirubin Urine: NEGATIVE
Glucose, UA: >=500 mg/dL — AB
Hgb urine dipstick: NEGATIVE
Ketones, ur: NEGATIVE mg/dL
Nitrite: NEGATIVE
Protein, ur: NEGATIVE mg/dL
Specific Gravity, Urine: >=1.03 (ref 1.005–1.030)
pH: 5 (ref 5.0–8.0)

## 2023-06-30 LAB — CBC WITH DIFFERENTIAL/PLATELET
Abs Immature Granulocytes: 0.02 10*3/uL (ref 0.00–0.07)
Basophils Absolute: 0.1 10*3/uL (ref 0.0–0.1)
Basophils Relative: 1 %
Eosinophils Absolute: 0.3 10*3/uL (ref 0.0–0.5)
Eosinophils Relative: 4 %
HCT: 37.7 % (ref 36.0–46.0)
Hemoglobin: 11.8 g/dL — ABNORMAL LOW (ref 12.0–15.0)
Immature Granulocytes: 0 %
Lymphocytes Relative: 35 %
Lymphs Abs: 2.6 10*3/uL (ref 0.7–4.0)
MCH: 29.9 pg (ref 26.0–34.0)
MCHC: 31.3 g/dL (ref 30.0–36.0)
MCV: 95.7 fL (ref 80.0–100.0)
Monocytes Absolute: 0.5 10*3/uL (ref 0.1–1.0)
Monocytes Relative: 7 %
Neutro Abs: 3.8 10*3/uL (ref 1.7–7.7)
Neutrophils Relative %: 53 %
Platelets: 303 10*3/uL (ref 150–400)
RBC: 3.94 MIL/uL (ref 3.87–5.11)
RDW: 15.6 % — ABNORMAL HIGH (ref 11.5–15.5)
WBC: 7.3 10*3/uL (ref 4.0–10.5)
nRBC: 0 % (ref 0.0–0.2)

## 2023-06-30 LAB — URINALYSIS, MICROSCOPIC (REFLEX)

## 2023-06-30 MED ORDER — HYDROMORPHONE HCL 1 MG/ML IJ SOLN
0.5000 mg | Freq: Once | INTRAMUSCULAR | Status: AC
  Administered 2023-06-30: 0.5 mg via INTRAVENOUS
  Filled 2023-06-30: qty 1

## 2023-06-30 MED ORDER — ACETAMINOPHEN 500 MG PO TABS
1000.0000 mg | ORAL_TABLET | Freq: Once | ORAL | Status: AC
  Administered 2023-06-30: 1000 mg via ORAL
  Filled 2023-06-30: qty 2

## 2023-06-30 MED ORDER — METOCLOPRAMIDE HCL 10 MG PO TABS: 10.0000 mg | ORAL_TABLET | Freq: Four times a day (QID) | ORAL | 0 refills | Status: DC

## 2023-06-30 MED ORDER — METOCLOPRAMIDE HCL 5 MG/ML IJ SOLN
10.0000 mg | Freq: Once | INTRAMUSCULAR | Status: AC
  Administered 2023-06-30: 10 mg via INTRAVENOUS
  Filled 2023-06-30: qty 2

## 2023-06-30 MED ORDER — DEXAMETHASONE SODIUM PHOSPHATE 10 MG/ML IJ SOLN
10.0000 mg | Freq: Once | INTRAMUSCULAR | Status: AC
  Administered 2023-06-30: 10 mg via INTRAVENOUS
  Filled 2023-06-30: qty 1

## 2023-06-30 MED ORDER — DIPHENHYDRAMINE HCL 50 MG/ML IJ SOLN
25.0000 mg | Freq: Once | INTRAMUSCULAR | Status: AC
  Administered 2023-06-30: 25 mg via INTRAVENOUS
  Filled 2023-06-30: qty 1

## 2023-06-30 NOTE — ED Provider Notes (Signed)
[facility] Provider Note   CSN: [id number] Arrival date & time: 06/30/23  1645     History  Chief Complaint  Patient presents with   Headache    Paula Stout is a 67 y.o. female.  Patient is a 67 year old female with a past medical history of migraines, DM, CKD, bipolar, A-fib on Eliquis presenting to the emergency department with a headache.  The patient states that she has had a migraine headache for the past 2 days that feels similar to her prior headaches in the past.  She states that she has been taking her Nurtec at home without any relief.  She states that she has associated photophobia and that her vision feels blurry and has been nauseous and vomiting.  States that the pain is in the left backside of her head and feels like a stabbing pain radiating to her right forehead.  She denies any numbness or weakness.  She states that she spoke with her neurologist recommended she come to the emergency department for further workup he states that normally fluids, Phenergan, Benadryl, Dilaudid, Decadron and Reglan help with her migraines in the past.  The history is provided by the patient.  Headache      Home Medications Prior to Admission medications   Medication Sig Start Date End Date Taking? Authorizing Provider  metoCLOPramide (REGLAN) 10 MG tablet Take 1 tablet (10 mg total) by mouth every 6 (six) hours. 06/30/23  Yes Elayne Snare K, DO  acetaminophen (TYLENOL) 500 MG tablet Take 1,000 mg by mouth every 6 (six) hours as needed for mild pain.    [provider]  albuterol (VENTOLIN HFA) 108 (90 Base) MCG/ACT inhaler Inhale 2 puffs into the lungs every 4 (four) hours as needed for wheezing. 06/23/20   Sharlene Dory, DO  Ascorbic Acid (VITAMIN C PO) Take 1 tablet by mouth daily.    [provider]  atorvastatin (LIPITOR) 80 MG tablet TAKE 1 TABLET DAILY 02/09/23   Sharlene Dory, DO  blood  glucose meter kit and supplies One Touch Ultra  Check blood sugars once daily Dx: E11.9 Patient taking differently: 1 each by Other route See admin instructions. One Touch Ultra  Check blood sugars once daily Dx: E11.9 10/01/21   Sharlene Dory, DO  botulinum toxin Type A (BOTOX) 200 units injection Inject 155 units IM into multiple site in the face,neck and head once every 90 days Patient taking differently: Inject 155 Units into the muscle See admin instructions. Inject 155 units IM into multiple site in the face,neck and head once every 90 days 06/15/22   Drema Dallas, DO  buPROPion (WELLBUTRIN XL) 300 MG 24 hr tablet Take 1 tablet (300 mg total) by mouth daily. 06/29/23   Joan Flores, NP  Cholecalciferol (VITAMIN D3) 125 MCG (5000 UT) capsule Take 5,000 Units by mouth daily.    [provider]  Cyanocobalamin (VITAMIN B-12) 5000 MCG TBDP Take 5,000 mcg by mouth daily.    [provider]  dapagliflozin propanediol (FARXIGA) 10 MG TABS tablet Take 1 tablet (10 mg total) by mouth daily. 03/23/23   Sharlene Dory, DO  diltiazem (CARDIZEM CD) 240 MG 24 hr capsule Take 1 capsule (240 mg total) by mouth daily. 06/20/23   Camnitz, Andree Coss, MD  diltiazem (CARDIZEM) 30 MG tablet Take 1 tablet every 4 hours AS NEEDED for AFIB or SVT heart rate >95 as long as top BP >100. Patient  taking differently: Take 30 mg by mouth See admin instructions. Take 1 tablet every 4 hours AS NEEDED for AFIB or SVT heart rate >95 as long as top BP >100. 12/03/22   Sherie Don, NP  ELIQUIS 5 MG TABS tablet TAKE 1 TABLET TWICE A DAY 02/02/23   Wendling, Jilda Roche, DO  EPINEPHrine 0.3 mg/0.3 mL IJ SOAJ injection Inject 0.3 mg into the muscle as needed for anaphylaxis. 11/17/20   Sharlene Dory, DO  escitalopram (LEXAPRO) 10 MG tablet Take 1 tablet (10 mg total) by mouth daily. 06/29/23   Joan Flores, NP  ezetimibe (ZETIA) 10 MG tablet TAKE 1 TABLET DAILY 02/03/23   Sharlene Dory, DO  fenofibrate 160 MG tablet Take 1 tablet (160 mg total) by mouth daily. 11/23/22   Sharlene Dory, DO  fluconazole (DIFLUCAN) 150 MG tablet Take 1 tab, repeat in 72 hours if no improvement. 06/08/23   Sharlene Dory, DO  fluticasone (FLONASE) 50 MCG/ACT nasal spray Place 2 sprays into both nostrils daily. Patient taking differently: Place 2 sprays into both nostrils 3 (three) times a week. 01/29/23   Junie Spencer, FNP  levocetirizine (XYZAL) 5 MG tablet TAKE 1 TABLET EVERY EVENING Patient taking differently: Take 5 mg by mouth every evening. 01/14/23   Sharlene Dory, DO  LORazepam (ATIVAN) 0.5 MG tablet Take 1 tablet (0.5 mg total) by mouth every 8 (eight) hours. 06/29/23   Joan Flores, NP  Lurasidone HCl 120 MG TABS Take 1 tablet (120 mg total) by mouth at bedtime. 06/29/23   Joan Flores, NP  magnesium oxide (MAG-OX) 400 (240 Mg) MG tablet Take 400 mg by mouth daily.    [provider]  metFORMIN (GLUCOPHAGE-XR) 500 MG 24 hr tablet TAKE 1 TABLET TWICE A DAY WITH MEALS (DECREASED IN DOSE) 06/27/23   Sharlene Dory, DO  metoprolol succinate (TOPROL-XL) 100 MG 24 hr tablet Take 1 tablet (100 mg total) by mouth 2 (two) times daily. Take with or immediately following a meal. 04/12/23   Camnitz, Andree Coss, MD  metoprolol tartrate (LOPRESSOR) 25 MG tablet Take 1 tablet (25 mg total) by mouth every 4 (four) hours as needed (PALPITATIONS). 08/24/22   Camnitz, Andree Coss, MD  Multiple Vitamins-Minerals (MULTIVITAMIN WITH MINERALS) tablet Take 1 tablet by mouth daily.    [provider]  nitroGLYCERIN (NITROSTAT) 0.4 MG SL tablet Place 1 tablet (0.4 mg total) under the tongue every 5 (five) minutes as needed for chest pain. 03/16/23   Georgeanna Lea, MD  ondansetron (ZOFRAN-ODT) 4 MG disintegrating tablet Take 1 tablet (4 mg total) by mouth every 8 (eight) hours as needed for nausea or vomiting. 01/27/23   Carmelia Roller, Jilda Roche, DO  ONETOUCH VERIO test strip USE DAILY TO CHECK BLOOD SUGAR Patient taking differently: 1 each by Other route as needed for other (see below). Use daily to check blood sugar. 10/29/22   Sharlene Dory, DO  pantoprazole (PROTONIX) 40 MG tablet Take 1 tablet (40 mg total) by mouth daily. 11/22/22   Sharlene Dory, DO  QUEtiapine (SEROQUEL) 300 MG tablet Take 1 tablet (300 mg total) by mouth at bedtime. 06/29/23   Joan Flores, NP  Rimegepant Sulfate (NURTEC) 75 MG TBDP Take 1 tablet (75 mg total) by mouth as needed (take 1 tab at the earlist onset of a migraine. Max 1 tab in 24 hours). 04/04/23   Drema Dallas, DO  tirzepatide [facility]) 10 MG/0.5ML Pen  Inject 10 mg into the skin once a week for 28 days. 06/28/23 07/26/23  Sharlene Dory, DO  tirzepatide [facility]) 12.5 MG/0.5ML Pen Inject 12.5 mg into the skin once a week. 07/26/23   Sharlene Dory, DO  topiramate (TOPAMAX) 50 MG tablet TAKE 3 TABLETS TWICE A DAY Patient taking differently: Take 150 mg by mouth 2 (two) times daily. 01/14/23   Sharlene Dory, DO      Allergies    Bee venom, Onion, Tetanus-diphtheria toxoids td, Sumatriptan, Zolpidem, Asa [aspirin], Iodine, Nsaids, Sulfa antibiotics, and Vancomycin    Review of Systems   Review of Systems  Neurological:  Positive for headaches.    Physical Exam Updated Vital Signs BP 118/79   Pulse 69   Temp 98.4 F (36.9 C) (Oral)   Resp 16   Ht 5\' 6"  (1.676 m)   Wt 93.9 kg   SpO2 94%   BMI 33.41 kg/m  Physical Exam Vitals and nursing note reviewed.  Constitutional:      General: She is not in acute distress.    Appearance: She is well-developed.  HENT:     Head: Normocephalic and atraumatic.     Mouth/Throat:     Mouth: Mucous membranes are moist.  Eyes:     Extraocular Movements: Extraocular movements intact.     Pupils: Pupils are equal, round, and reactive to light.  Cardiovascular:     Rate and Rhythm: Normal rate and  regular rhythm.  Pulmonary:     Effort: Pulmonary effort is normal.     Breath sounds: Normal breath sounds.  Abdominal:     General: Bowel sounds are normal.     Palpations: Abdomen is soft.  Musculoskeletal:        General: Normal range of motion.     Cervical back: Normal range of motion and neck supple.  Skin:    General: Skin is warm and dry.  Neurological:     Mental Status: She is alert and oriented to person, place, and time.     GCS: GCS eye subscore is 4. GCS verbal subscore is 5. GCS motor subscore is 6.     Cranial Nerves: No cranial nerve deficit, dysarthria or facial asymmetry.     Sensory: No sensory deficit.     Motor: No weakness.     Coordination: Coordination normal.  Psychiatric:        Mood and Affect: Mood normal.        Speech: Speech normal.        Behavior: Behavior normal.     ED Results / Procedures / Treatments   Labs (all labs ordered are listed, but only abnormal results are displayed) Labs Reviewed  CBC WITH DIFFERENTIAL/PLATELET - Abnormal; Notable for the following components:      Result Value   Hemoglobin 11.8 (*)    RDW 15.6 (*)    All other components within normal limits  BASIC METABOLIC PANEL - Abnormal; Notable for the following components:   Chloride 112 (*)    CO2 19 (*)    Glucose, Bld 132 (*)    BUN 25 (*)    Creatinine, Ser 2.03 (*)    GFR, Estimated 27 (*)    All other components within normal limits  URINALYSIS, ROUTINE W REFLEX MICROSCOPIC - Abnormal; Notable for the following components:   Glucose, UA >=500 (*)    Leukocytes,Ua TRACE (*)    All other components within normal limits  URINALYSIS, MICROSCOPIC (REFLEX) - Abnormal;  Notable for the following components:   Bacteria, UA FEW (*)    All other components within normal limits    EKG None  Radiology No results found.  Procedures Procedures    Medications Ordered in ED Medications  diphenhydrAMINE (BENADRYL) injection 25 mg (25 mg Intravenous Given  06/30/23 1931)  HYDROmorphone (DILAUDID) injection 0.5 mg (0.5 mg Intravenous Given 06/30/23 1931)  dexamethasone (DECADRON) injection 10 mg (10 mg Intravenous Given 06/30/23 1930)  metoCLOPramide (REGLAN) injection 10 mg (10 mg Intravenous Given 06/30/23 1931)  acetaminophen (TYLENOL) tablet 1,000 mg (1,000 mg Oral Given 06/30/23 1927)  HYDROmorphone (DILAUDID) injection 0.5 mg (0.5 mg Intravenous Given 06/30/23 2051)    ED Course/ Medical Decision Making/ A&P Clinical Course as of 06/30/23 2240  Thu Jun 30, 2023  2053 Patient reports improvement of her frontal headache and nausea but is still having a posterior headache.  She will be given second dose of pain medication and was requesting an ice pack and will be closely reassessed. [VK]  2238 Upon reassessment, the patient's headache has resolved.  She is stable for discharge home with outpatient follow-up and was given strict return precautions. [VK]    Clinical Course User Index [VK] Rexford Maus, DO                                 Medical Decision Making This patient presents to the ED with chief complaint(s) of headache with pertinent past medical history of migraines, DM, HTN, CKD, a fib on Eliquis which further complicates the presenting complaint. The complaint involves an extensive differential diagnosis and also carries with it a high risk of complications and morbidity.    The differential diagnosis includes likely migraine headache, headache, ICH or mass effect unlikely as headache is similar to prior headaches in the past and neurologic deficit, no fever or meningismus making meningitis unlikely  Additional history obtained: Additional history obtained from N/A Records reviewed outpatient neurology records  ED Course and Reassessment: On patient's arrival she is hemodynamically stable in no acute distress.  She is initially evaluated by triage and had labs and urine performed.  Labs are at patient's baseline without  acute abnormality.  Patient will be treated with headache cocktail including Tylenol, Dilaudid, Decadron, Reglan and Benadryl and will be encouraged oral fluids and will be closely reassessed.  Independent labs interpretation:  The following labs were independently interpreted: At baseline without acute abnormality  Independent visualization of imaging: - N/A  Consultation: - Consulted or discussed management/test interpretation w/ external professional: N/A  Consideration for admission or further workup: Patient has no emergent conditions requiring admission or further work-up at this time and is stable for discharge home with neurology follow-up  Social Determinants of health: N/A    Amount and/or Complexity of Data Reviewed Labs: ordered.  Risk OTC drugs. Prescription drug management.          Final Clinical Impression(s) / ED Diagnoses Final diagnoses:  Migraine with status migrainosus, not intractable, unspecified migraine type    Rx / DC Orders ED Discharge Orders          Ordered    metoCLOPramide (REGLAN) 10 MG tablet  Every 6 hours        06/30/23 2238              Rexford Maus, DO 06/30/23 2240

## 2023-06-30 NOTE — Discharge Instructions (Signed)
You were seen in the emergency department for your migraine headache.  We gave you headache and nausea medication in the emergency department with improvement.  Continue to take Tylenol and your home headache medications as needed.  She was should follow-up with your neurologist to have your symptoms rechecked.  Return to the emergency department if you have significantly worsening pain, repetitive vomiting, numbness or weakness in one-sided body compared to the other or if you have any other new or concerning symptoms.

## 2023-06-30 NOTE — ED Triage Notes (Addendum)
Pt states she has been having a headache x 2 days.  Pt has migraines and this is typical to her regular migraines.  Pt took Nyrtec at home but hasn't helped.  Some N/V, some blurry vision, some sensitivity to light and sound.  Pt states she gets Botox injections but last one hasn't been effective.

## 2023-07-04 ENCOUNTER — Encounter: Payer: Self-pay | Admitting: Cardiology

## 2023-07-05 NOTE — Telephone Encounter (Signed)
I spoke with patient.  She reports since Cardizem was increased at recent office visit she has been getting alerts on her watch that her heart rate is in the 40's.  Occurred at night several times so she stopped wearing her watch during the night.  She has been wearing her watch during the day and will notice heart rate in the 40's several times per day when she is resting.  Heart rate will go up to 60's with activities such as going up and down stairs.  Currently she is sitting outside and her heart rate is 68.  Yesterday she had squeezing chest pain while unloading the dishwasher.  Lasted about 5-10 minutes.  Went away on it's own when she sat down and rested.  She has been having these episodes 1-2 times daily since prior to the ablation. No new changes in her pain. I let patient know we would call her back with MD recommendations

## 2023-07-06 ENCOUNTER — Encounter: Payer: Self-pay | Admitting: Behavioral Health

## 2023-07-06 ENCOUNTER — Ambulatory Visit (INDEPENDENT_AMBULATORY_CARE_PROVIDER_SITE_OTHER): Payer: Medicare (Managed Care) | Admitting: Behavioral Health

## 2023-07-06 DIAGNOSIS — F331 Major depressive disorder, recurrent, moderate: Secondary | ICD-10-CM

## 2023-07-06 DIAGNOSIS — F419 Anxiety disorder, unspecified: Secondary | ICD-10-CM

## 2023-07-06 DIAGNOSIS — F32A Depression, unspecified: Secondary | ICD-10-CM | POA: Diagnosis not present

## 2023-07-06 DIAGNOSIS — F411 Generalized anxiety disorder: Secondary | ICD-10-CM

## 2023-07-06 NOTE — Progress Notes (Signed)
[facility] Counselor/Therapist Progress Note  Patient ID: Paula Stout, MRN: [id number],    Date: 07/06/2023  Time Spent: 58 minutes, 2:00 PM  to 2:58 spent with the patient.This session was held via video teletherapy. The patient consented to the video teletherapy and was located in her home during this session. She is aware it is the responsibility of the patient to secure confidentiality on her end of the session. The provider was in a private home  for the duration of this session.  The session began as a video session the Wi-Fi/Internet was sketchy so we had to finish via phone session.     Treatment Type: Individual Therapy  Reported Symptoms: Anxiety, depression  Mental Status Exam: Appearance:  Casual     Behavior: Appropriate  Motor: Normal  Speech/Language:  Normal Rate  Affect: Appropriate  Mood: normal  Thought process: normal  Thought content:   WNL  Sensory/Perceptual disturbances:   WNL  Orientation: oriented to person, place, time/date, situation, day of week, month of year, and year  Attention: Good  Concentration: Good  Memory: WNL  Fund of knowledge:  Good  Insight:   Good  Judgment:  Good  Impulse Control: Good   Risk Assessment: Danger to Self:  No Self-injurious Behavior: No Danger to Others: No Duty to Warn:no Physical Aggression / Violence:No  Access to Firearms a concern: No  Gang Involvement:No   Subjective: It has been a frustrating week for the patient.  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  Part it was her fault but it could have been called at a couple of different times.  Thankfully could not be fixed but the patient does not like making mistakes.  She is noticing more in the morning after she goes down to let her dog out and goes back in the house that she is having lightheaded and dizzy type spells.  One morning this week she had to sit down on the kitchen floor to avoid passing  out and had to sit there for 15 minutes.  Her son-in-law came by and ask while she was sitting on the floor but did not offer help and Going to do whatever he was doing.  Her daughter came in and saw it and did not offer to help but told her she needed to sit in her recliner.  She is trying to figure out what is causing those dizzy spells and has reached out to the electrical cardiac specialist and is waiting for an answer.  Medication changes that helped to slow down some of the palpitations for which she is thankful.  There is some anticipatory anxiety about the biopsy of her thyroid tomorrow.  She is not as concerned about the procedure as the outcome.  She knows it will be up to 2 weeks before she hears we talked about ways to cope with waiting for that answer.  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  They are going to look at the spot on her lung some time in November.  Her anxiety comes from the fact that her husband had cancer and died of cancer.  We talked about challenging those thoughts and waiting on answers and using coping skills in that way time. The patient did say that she felt some benefit from Lexapro as she is tapering up on that and off Viibryd.  She has been hesitant  to take the as needed anxiety medication as her mother had become addicted to it.  Encouraged her to have that conversation with her doctor. Plan: I will meet with the patient weekly via care agility.  Treatment plan: We will use cognitive behavioral therapy as well as person centered and supportive therapy in addition to elements of dialectical behavior therapy to help reduce the patient's anxiety and depression by at least 50% with a target date of October 06, 2023.  Goals for improving depression may include having less sadness as indicated by patient report and scores on the PHQ-9, have improved mood and return to a healthier level of functioning, identify causes including environment for  depressed mood and learn ways to cope with depression especially those connected to her medical issues.  Interventions include using cognitive behavioral therapy to explore and replace thoughts and behaviors.  We will look at how depression is experienced in day-to-day living and encouraged sharing of feelings.  We will encourage the use of coping skills for management of depressive symptoms.  Goals for reducing anxiety are to improve her ability to manage anxiety symptoms, better handle stress, identify causes for anxiety and explore ways to lower it, resolve the core conflicts contributing to anxiety as well as manage thoughts and worrisome thinking contributing to feelings of anxiety.  Interventions will include providing education about anxiety, facilitate problem solution skills to help her identify options for resolving stress, teach coping skills for managing anxiety as well as mindfulness and communication in terms of family to reduce her stress level, use cognitive behavior therapy to identify and change anxiety provoking thought and behavior patterns as well as teach distress tolerance and mindfulness skills for alleviating anxiety. Progress: 30%  [name], [facility]                  [name], [facility]               [name], [facility]               [name], [facility]               [name], [facility]               [name], [facility]               [name], [facility]               [name], [facility]               [name], [facility]               [name], [facility]         [facility] Counselor/Therapist Progress Note  Patient ID: Paula Stout, MRN: [id number],    Date: 07/06/2023  Time Spent: 58 minutes, 2:00 PM  to 2:58 spent with the patient.This session was held via video teletherapy. The patient consented to  the video teletherapy and was located in her home during this session. She is aware it is the responsibility of the patient to secure confidentiality on her end of the session. The provider was in a private home  for the duration of this session.      Treatment Type: Individual Therapy  Reported Symptoms: Anxiety, depression  Mental Status Exam: Appearance:  Casual     Behavior: Appropriate  Motor: Normal  Speech/Language:  Normal Rate  Affect: Appropriate  Mood: normal  Thought process: normal  Thought content:   WNL  Sensory/Perceptual disturbances:   WNL  Orientation: oriented to person, place, time/date, situation, day of week, month of year, and year  Attention: Good  Concentration: Good  Memory: WNL  Fund of knowledge:  Good  Insight:   Good  Judgment:  Good  Impulse Control: Good   Risk Assessment: Danger to Self:  No Self-injurious Behavior: No Danger to Others: No Duty to Warn:no Physical Aggression / Violence:No  Access to Firearms a concern: No  Gang Involvement:No   Subjective: The patient did meet with her psychiatrist and they are going to start Lexapro and Ativan tomorrow because the Viibryd was not promoting any mood elevation.  She told him and we processed that she had a manic episode a few days ago and spent significant amount of money.  It is not things that she will use for her doll making outfit but not money that she needed to spend.  She did send some of the things back.  We talked about ways that she could reduce the temptation especially in a manic phase such as delaying the Her Phone, How to Distract Herself, Cognitive Reframing Etc.  She Has Come Out Of the Manic Episode and She and the Doctor Optimistic the New Medication Will Help Stabilize That.  She Is Scheduled for a Thyroid Biopsy on the 31st.  She Is Not Necessarily Anxious about the Procedure but Anxious about What They Might Find.  The Ear Nose and Throat Specialist Could Not Say without  Actually Having a Biopsy What That Might Look like.  She Said She Would Hear within 2 Weeks the Results of That Biopsy.  We Talked about Compartmentalization and Not Projecting.  [facility] Is At [facility] Experiencing a Reduced Heart Rate so the Palpitations Although Still Present Are Not As Intense but Still Uncomfortable.  It Is Complicated by the Environment That She Is Living in.  She Is Having to Feed Her Dog Upstairs Because Her Daughter's Dogs Are Taking Her Dog's Dog Food Which Is Specially for Him.  She Is Trying to Get outside As Regularly As She Can to Sit outside with Her Dog since It Is Nice but It Takes a Significant Amount of Energy to Get up and down the Stairs.  Her Daughter Reminded Her As Did I to Practice Her Coping Skills and We Reviewed Those Even When the Anxiety Is Manageable so That They Become a Natural Part of Her Response to Anxiety When She Needs It.  Plan: I will meet with the patient weekly via care agility.  Treatment plan: We will use cognitive behavioral therapy as well as person centered and supportive therapy in addition to elements of dialectical behavior therapy to help reduce the patient's anxiety and depression by at least 50% with a target date of October 06, 2023.  Goals for improving depression may include having less sadness as indicated by patient report and scores on the PHQ-9, have improved mood and return to a healthier level of functioning, identify causes including environment for depressed mood and learn ways to cope with depression especially those connected to her medical issues.  Interventions include using cognitive behavioral therapy to explore and replace thoughts and behaviors.  We will look at how depression is experienced in day-to-day living and encouraged sharing of feelings.  We will encourage the use of coping skills for management of depressive symptoms.  Goals for reducing anxiety are to improve her ability to manage  anxiety symptoms, better handle  stress, identify causes for anxiety and explore ways to lower it, resolve the core conflicts contributing to anxiety as well as manage thoughts and worrisome thinking contributing to feelings of anxiety.  Interventions will include providing education about anxiety, facilitate problem solution skills to help her identify options for resolving stress, teach coping skills for managing anxiety as well as mindfulness and communication in terms of family to reduce her stress level, use cognitive behavior therapy to identify and change anxiety provoking thought and behavior patterns as well as teach distress tolerance and mindfulness skills for alleviating anxiety. Progress: 30%  [name], [facility]                  [name], [facility]               [name], [facility]               [name], [facility]               [name], [facility]               [name], [facility]               [name], [facility]               [name], [facility]               [name], [facility]               [name], [facility]               [name], [facility]

## 2023-07-07 ENCOUNTER — Encounter: Payer: Self-pay | Admitting: Family Medicine

## 2023-07-07 ENCOUNTER — Other Ambulatory Visit: Payer: Self-pay | Admitting: Behavioral Health

## 2023-07-07 ENCOUNTER — Other Ambulatory Visit: Payer: Self-pay | Admitting: Family Medicine

## 2023-07-07 DIAGNOSIS — F411 Generalized anxiety disorder: Secondary | ICD-10-CM

## 2023-07-07 DIAGNOSIS — F3130 Bipolar disorder, current episode depressed, mild or moderate severity, unspecified: Secondary | ICD-10-CM

## 2023-07-07 DIAGNOSIS — F319 Bipolar disorder, unspecified: Secondary | ICD-10-CM

## 2023-07-07 MED ORDER — DILTIAZEM HCL ER COATED BEADS 120 MG PO CP24: 120.0000 mg | ORAL_CAPSULE | Freq: Every day | ORAL | 3 refills | Status: DC

## 2023-07-08 DIAGNOSIS — E041 Nontoxic single thyroid nodule: Secondary | ICD-10-CM | POA: Diagnosis not present

## 2023-07-10 ENCOUNTER — Other Ambulatory Visit: Payer: Self-pay

## 2023-07-10 ENCOUNTER — Emergency Department (HOSPITAL_BASED_OUTPATIENT_CLINIC_OR_DEPARTMENT_OTHER)
Admission: EM | Admit: 2023-07-10 | Discharge: 2023-07-10 | Disposition: A | Discharge status: Home / Self Care | Payer: Medicare (Managed Care) | Attending: Emergency Medicine | Admitting: Emergency Medicine

## 2023-07-10 ENCOUNTER — Encounter (HOSPITAL_BASED_OUTPATIENT_CLINIC_OR_DEPARTMENT_OTHER): Payer: Self-pay | Admitting: Emergency Medicine

## 2023-07-10 DIAGNOSIS — R519 Headache, unspecified: Secondary | ICD-10-CM | POA: Diagnosis not present

## 2023-07-10 DIAGNOSIS — Z7901 Long term (current) use of anticoagulants: Secondary | ICD-10-CM | POA: Insufficient documentation

## 2023-07-10 DIAGNOSIS — E119 Type 2 diabetes mellitus without complications: Secondary | ICD-10-CM | POA: Insufficient documentation

## 2023-07-10 DIAGNOSIS — Z7984 Long term (current) use of oral hypoglycemic drugs: Secondary | ICD-10-CM | POA: Insufficient documentation

## 2023-07-10 LAB — BASIC METABOLIC PANEL
Anion gap: 13 (ref 5–15)
BUN: 24 mg/dL — ABNORMAL HIGH (ref 8–23)
CO2: 20 mmol/L — ABNORMAL LOW (ref 22–32)
Calcium: 9.7 mg/dL (ref 8.9–10.3)
Chloride: 107 mmol/L (ref 98–111)
Creatinine, Ser: 1.98 mg/dL — ABNORMAL HIGH (ref 0.44–1.00)
GFR, Estimated: 28 mL/min — ABNORMAL LOW (ref >60)
Glucose, Bld: 110 mg/dL — ABNORMAL HIGH (ref 70–99)
Potassium: 4 mmol/L (ref 3.5–5.1)
Sodium: 140 mmol/L (ref 135–145)

## 2023-07-10 LAB — CBC WITH DIFFERENTIAL/PLATELET
Abs Immature Granulocytes: 0.02 10*3/uL (ref 0.00–0.07)
Basophils Absolute: 0.1 10*3/uL (ref 0.0–0.1)
Basophils Relative: 1 %
Eosinophils Absolute: 0.3 10*3/uL (ref 0.0–0.5)
Eosinophils Relative: 4 %
HCT: 38.3 % (ref 36.0–46.0)
Hemoglobin: 12.3 g/dL (ref 12.0–15.0)
Immature Granulocytes: 0 %
Lymphocytes Relative: 27 %
Lymphs Abs: 2.2 10*3/uL (ref 0.7–4.0)
MCH: 30 pg (ref 26.0–34.0)
MCHC: 32.1 g/dL (ref 30.0–36.0)
MCV: 93.4 fL (ref 80.0–100.0)
Monocytes Absolute: 0.7 10*3/uL (ref 0.1–1.0)
Monocytes Relative: 9 %
Neutro Abs: 4.8 10*3/uL (ref 1.7–7.7)
Neutrophils Relative %: 59 %
Platelets: 295 10*3/uL (ref 150–400)
RBC: 4.1 MIL/uL (ref 3.87–5.11)
RDW: 15.2 % (ref 11.5–15.5)
WBC: 8.1 10*3/uL (ref 4.0–10.5)
nRBC: 0 % (ref 0.0–0.2)

## 2023-07-10 LAB — CBG MONITORING, ED: Glucose-Capillary: 101 mg/dL — ABNORMAL HIGH (ref 70–99)

## 2023-07-10 MED ORDER — HYDROMORPHONE HCL 1 MG/ML IJ SOLN
0.5000 mg | Freq: Once | INTRAMUSCULAR | Status: AC
Start: 2023-07-10 — End: 2023-07-10
  Administered 2023-07-10: 0.5 mg via INTRAVENOUS
  Filled 2023-07-10: qty 1

## 2023-07-10 MED ORDER — HYDROMORPHONE HCL 1 MG/ML IJ SOLN
1.0000 mg | Freq: Once | INTRAMUSCULAR | Status: AC
Start: 2023-07-10 — End: 2023-07-10
  Administered 2023-07-10: 1 mg via INTRAVENOUS
  Filled 2023-07-10: qty 1

## 2023-07-10 MED ORDER — SODIUM CHLORIDE 0.9 % IV BOLUS
1000.0000 mL | Freq: Once | INTRAVENOUS | Status: AC
  Administered 2023-07-10: 1000 mL via INTRAVENOUS

## 2023-07-10 MED ORDER — PROCHLORPERAZINE EDISYLATE 10 MG/2ML IJ SOLN
10.0000 mg | Freq: Once | INTRAMUSCULAR | Status: AC
Start: 2023-07-10 — End: 2023-07-10
  Administered 2023-07-10: 10 mg via INTRAVENOUS
  Filled 2023-07-10: qty 2

## 2023-07-10 MED ORDER — DIPHENHYDRAMINE HCL 50 MG/ML IJ SOLN
12.5000 mg | Freq: Once | INTRAMUSCULAR | Status: AC
  Administered 2023-07-10: 12.5 mg via INTRAVENOUS
  Filled 2023-07-10: qty 1

## 2023-07-10 MED ORDER — SODIUM CHLORIDE 0.9 % IV SOLN
INTRAVENOUS | Status: DC | PRN
  Administered 2023-07-10: 250 mL via INTRAVENOUS

## 2023-07-10 MED ORDER — MAGNESIUM SULFATE 2 GM/50ML IV SOLN
2.0000 g | Freq: Once | INTRAVENOUS | Status: AC
  Administered 2023-07-10: 2 g via INTRAVENOUS
  Filled 2023-07-10: qty 50

## 2023-07-10 MED ORDER — DEXAMETHASONE SODIUM PHOSPHATE 10 MG/ML IJ SOLN
10.0000 mg | Freq: Once | INTRAMUSCULAR | Status: AC
  Administered 2023-07-10: 10 mg via INTRAVENOUS
  Filled 2023-07-10: qty 1

## 2023-07-10 NOTE — ED Provider Notes (Signed)
[place] [facility] Provider Note   CSN: [id number] Arrival date & time: 07/10/23  1848     History  Chief Complaint  Patient presents with   Headache    Paula Stout is a 67 y.o. female.  With extensive history including but not limited to A-fib on Eliquis, frequent migraines, bipolar 1 disorder, anxiety, PTSD, NIDDM 2 presenting to the ED for evaluation of a headache.  She states her headache began approximately 2 days ago.  It began shortly after she woke up.  Localized to the posterior of the right head with radiation to the right forehead.  It is described as a sensation as though someone is stabbing her in the head.  Pain is constant.  Her symptoms feel similar to all of her previous migraines.  She typically gets Botox injections every 3 months and states that these injections worked very well for 2 months at a time but states that the last injection has not helped as much as it typically does.  She reports photophobia and phonophobia as well as nausea and vomiting.  She has been unable to eat anything today.  She reports calling her neurologist on Friday and was encouraged to come to the emergency department if her symptoms continue.  She has taken Zofran, Reglan and Nurtec at home with minimal improvement.  She reports binocular lateral blurred vision which is typical for her migraines.  No other vision changes.  No numbness, weakness or tingling.  No facial droop or slurred speech.  No fevers, chills or neck stiffness.  She states she has a typical migraine cocktail that improves her symptoms when she comes to the emergency department.   Headache      Home Medications Prior to Admission medications   Medication Sig Start Date End Date Taking? Authorizing Provider  acetaminophen (TYLENOL) 500 MG tablet Take 1,000 mg by mouth every 6 (six) hours as needed for mild pain.    [provider]  albuterol (VENTOLIN HFA) 108 (90 Base)  MCG/ACT inhaler Inhale 2 puffs into the lungs every 4 (four) hours as needed for wheezing. 06/23/20   Sharlene Dory, DO  Ascorbic Acid (VITAMIN C PO) Take 1 tablet by mouth daily.    [provider]  atorvastatin (LIPITOR) 80 MG tablet TAKE 1 TABLET DAILY 02/09/23   Sharlene Dory, DO  blood glucose meter kit and supplies One Touch Ultra  Check blood sugars once daily Dx: E11.9 Patient taking differently: 1 each by Other route See admin instructions. One Touch Ultra  Check blood sugars once daily Dx: E11.9 10/01/21   Sharlene Dory, DO  botulinum toxin Type A (BOTOX) 200 units injection Inject 155 units IM into multiple site in the face,neck and head once every 90 days Patient taking differently: Inject 155 Units into the muscle See admin instructions. Inject 155 units IM into multiple site in the face,neck and head once every 90 days 06/15/22   Drema Dallas, DO  buPROPion (WELLBUTRIN XL) 300 MG 24 hr tablet Take 1 tablet (300 mg total) by mouth daily. 06/29/23   Joan Flores, NP  Cholecalciferol (VITAMIN D3) 125 MCG (5000 UT) capsule Take 5,000 Units by mouth daily.    [provider]  Cyanocobalamin (VITAMIN B-12) 5000 MCG TBDP Take 5,000 mcg by mouth daily.    [provider]  dapagliflozin propanediol (FARXIGA) 10 MG TABS tablet Take 1 tablet (10 mg total) by mouth daily. 03/23/23  Sharlene Dory, DO  diltiazem (CARDIZEM CD) 120 MG 24 hr capsule Take 1 capsule (120 mg total) by mouth daily. 07/07/23   Camnitz, Andree Coss, MD  diltiazem (CARDIZEM) 30 MG tablet Take 1 tablet every 4 hours AS NEEDED for AFIB or SVT heart rate >95 as long as top BP >100. Patient taking differently: Take 30 mg by mouth See admin instructions. Take 1 tablet every 4 hours AS NEEDED for AFIB or SVT heart rate >95 as long as top BP >100. 12/03/22   Sherie Don, NP  ELIQUIS 5 MG TABS tablet TAKE 1 TABLET TWICE A DAY 02/02/23   Wendling, Jilda Roche, DO   EPINEPHrine 0.3 mg/0.3 mL IJ SOAJ injection Inject 0.3 mg into the muscle as needed for anaphylaxis. 11/17/20   Sharlene Dory, DO  escitalopram (LEXAPRO) 10 MG tablet Take 1 tablet (10 mg total) by mouth daily. 06/29/23   Joan Flores, NP  ezetimibe (ZETIA) 10 MG tablet TAKE 1 TABLET DAILY 02/03/23   Sharlene Dory, DO  fenofibrate 160 MG tablet Take 1 tablet (160 mg total) by mouth daily. 11/23/22   Sharlene Dory, DO  fluconazole (DIFLUCAN) 150 MG tablet Take 1 tab, repeat in 72 hours if no improvement. 06/08/23   Sharlene Dory, DO  fluticasone (FLONASE) 50 MCG/ACT nasal spray Place 2 sprays into both nostrils daily. Patient taking differently: Place 2 sprays into both nostrils 3 (three) times a week. 01/29/23   Junie Spencer, FNP  levocetirizine (XYZAL) 5 MG tablet TAKE 1 TABLET EVERY EVENING Patient taking differently: Take 5 mg by mouth every evening. 01/14/23   Sharlene Dory, DO  LORazepam (ATIVAN) 0.5 MG tablet Take 1 tablet (0.5 mg total) by mouth every 8 (eight) hours. 06/29/23   Joan Flores, NP  Lurasidone HCl 120 MG TABS Take 1 tablet (120 mg total) by mouth at bedtime. 06/29/23   Joan Flores, NP  magnesium oxide (MAG-OX) 400 (240 Mg) MG tablet Take 400 mg by mouth daily.    [provider]  metFORMIN (GLUCOPHAGE-XR) 500 MG 24 hr tablet TAKE 1 TABLET TWICE A DAY WITH MEALS (DECREASED IN DOSE) 06/27/23   Sharlene Dory, DO  metoCLOPramide (REGLAN) 10 MG tablet Take 1 tablet (10 mg total) by mouth every 6 (six) hours. 06/30/23   Elayne Snare K, DO  metoprolol succinate (TOPROL-XL) 100 MG 24 hr tablet Take 1 tablet (100 mg total) by mouth 2 (two) times daily. Take with or immediately following a meal. 04/12/23   Camnitz, Andree Coss, MD  metoprolol tartrate (LOPRESSOR) 25 MG tablet Take 1 tablet (25 mg total) by mouth every 4 (four) hours as needed (PALPITATIONS). 08/24/22   Camnitz, Andree Coss, MD  Multiple  Vitamins-Minerals (MULTIVITAMIN WITH MINERALS) tablet Take 1 tablet by mouth daily.    [provider]  nitroGLYCERIN (NITROSTAT) 0.4 MG SL tablet Place 1 tablet (0.4 mg total) under the tongue every 5 (five) minutes as needed for chest pain. 03/16/23   Georgeanna Lea, MD  ondansetron (ZOFRAN-ODT) 4 MG disintegrating tablet Take 1 tablet (4 mg total) by mouth every 8 (eight) hours as needed for nausea or vomiting. 01/27/23   Carmelia Roller, Jilda Roche, DO  ONETOUCH VERIO test strip USE DAILY TO CHECK BLOOD SUGAR Patient taking differently: 1 each by Other route as needed for other (see below). Use daily to check blood sugar. 10/29/22   Sharlene Dory, DO  pantoprazole (PROTONIX) 40 MG tablet Take 1 tablet (  40 mg total) by mouth daily. 11/22/22   Sharlene Dory, DO  QUEtiapine (SEROQUEL) 300 MG tablet Take 1 tablet (300 mg total) by mouth at bedtime. 06/29/23   Joan Flores, NP  Rimegepant Sulfate (NURTEC) 75 MG TBDP Take 1 tablet (75 mg total) by mouth as needed (take 1 tab at the earlist onset of a migraine. Max 1 tab in 24 hours). 04/04/23   Drema Dallas, DO  tirzepatide [facility]) 10 MG/0.5ML Pen Inject 10 mg into the skin once a week for 28 days. 06/28/23 07/26/23  Sharlene Dory, DO  tirzepatide [facility]) 12.5 MG/0.5ML Pen Inject 12.5 mg into the skin once a week. 07/26/23   Sharlene Dory, DO  topiramate (TOPAMAX) 50 MG tablet TAKE 3 TABLETS TWICE A DAY Patient taking differently: Take 150 mg by mouth 2 (two) times daily. 01/14/23   Sharlene Dory, DO      Allergies    Bee venom, Onion, Tetanus-diphtheria toxoids td, Sumatriptan, Zolpidem, Asa [aspirin], Iodine, Nsaids, Sulfa antibiotics, and Vancomycin    Review of Systems   Review of Systems  Neurological:  Positive for headaches.  All other systems reviewed and are negative.   Physical Exam Updated Vital Signs BP (!) 161/114 (BP Location: Right Arm)   Pulse 66   Temp 98.1 F  (36.7 C)   Resp 18   Ht 5\' 6"  (1.676 m)   Wt 93 kg   SpO2 100%   BMI 33.09 kg/m  Physical Exam Vitals and nursing note reviewed.  Constitutional:      General: She is not in acute distress.    Appearance: She is well-developed.  HENT:     Head: Normocephalic and atraumatic.  Eyes:     Conjunctiva/sclera: Conjunctivae normal.  Cardiovascular:     Rate and Rhythm: Normal rate and regular rhythm.     Heart sounds: No murmur heard. Pulmonary:     Effort: Pulmonary effort is normal. No respiratory distress.     Breath sounds: Normal breath sounds. No wheezing, rhonchi or rales.  Abdominal:     Palpations: Abdomen is soft.     Tenderness: There is no abdominal tenderness.  Musculoskeletal:        General: No swelling.     Cervical back: Neck supple.  Skin:    General: Skin is warm and dry.     Capillary Refill: Capillary refill takes less than 2 seconds.  Neurological:     Mental Status: She is alert and oriented to person, place, and time.     Comments:   MENTAL STATUS: AAOx3   LANG/SPEECH: Fluent, intact naming, repetition & comprehension   CRANIAL NERVES:   II: Pupils equal and reactive   III, IV, VI: EOM intact, no gaze preference or deviation, no nystagmus   V: normal sensation of the face   VII: no facial asymmetry   VIII: normal hearing to speech   MOTOR: 5/5 in both upper and lower extremities   SENSORY: Normal to touch in all extremiteis   COORD: Normal finger to nose, heel to shin and shoulder shrug, no tremor, no dysmetria. No pronator drift   Negative Kernig's and Brudzinski's  Psychiatric:        Mood and Affect: Mood normal.     ED Results / Procedures / Treatments   Labs (all labs ordered are listed, but only abnormal results are displayed) Labs Reviewed  BASIC METABOLIC PANEL - Abnormal; Notable for the following components:  Result Value   CO2 20 (*)    Glucose, Bld 110 (*)    BUN 24 (*)    Creatinine, Ser 1.98 (*)    GFR, Estimated 28 (*)     All other components within normal limits  CBG MONITORING, ED - Abnormal; Notable for the following components:   Glucose-Capillary 101 (*)    All other components within normal limits  CBC WITH DIFFERENTIAL/PLATELET    EKG None  Radiology No results found.  Procedures Procedures    Medications Ordered in ED Medications  0.9 %  sodium chloride infusion (250 mLs Intravenous New Bag/Given 07/10/23 2037)  prochlorperazine (COMPAZINE) injection 10 mg (10 mg Intravenous Given 07/10/23 2005)  diphenhydrAMINE (BENADRYL) injection 12.5 mg (12.5 mg Intravenous Given 07/10/23 2005)  HYDROmorphone (DILAUDID) injection 1 mg (1 mg Intravenous Given 07/10/23 2005)  sodium chloride 0.9 % bolus 1,000 mL (0 mLs Intravenous Stopped 07/10/23 2152)  dexamethasone (DECADRON) injection 10 mg (10 mg Intravenous Given 07/10/23 2005)  magnesium sulfate IVPB 2 g 50 mL (0 g Intravenous Stopped 07/10/23 2152)  HYDROmorphone (DILAUDID) injection 0.5 mg (0.5 mg Intravenous Given 07/10/23 2150)    ED Course/ Medical Decision Making/ A&P Clinical Course as of 07/10/23 2230  Sun Jul 10, 2023  2145 Headache improved, still not resolved.  Will treat with 0.5 mg Dilaudid and reassess [AS]    Clinical Course User Index [AS] Gene Colee, Edsel Petrin, PA-C                                 Medical Decision Making Amount and/or Complexity of Data Reviewed Labs: ordered.  Risk Prescription drug management.  This patient presents to the ED for concern of migraine, this involves an extensive number of treatment options, and is a complaint that carries with it a high risk of complications and morbidity.  Emergent considerations for headache include subarachnoid hemorrhage, meningitis, temporal arteritis, glaucoma, cerebral ischemia, carotid/vertebral dissection, intracranial tumor, Venous sinus thrombosis, carbon monoxide poisoning, acute or chronic subdural hemorrhage.  Other considerations include: Migraine, Cluster  headache, Hypertension, Caffeine, alcohol, or drug withdrawal, Pseudotumor cerebri, Arteriovenous malformation, Head injury, Neurocysticercosis, Post-lumbar puncture, Preeclampsia, Tension headache, Sinusitis, Cervical arthritis, Refractive error causing strain, Dental abscess, Otitis media, Temporomandibular joint syndrome, Depression, Somatoform disorder (eg, somatization) Trigeminal neuralgia, Glossopharyngeal neuralgia.   My initial workup includes labs, symptom control  Additional history obtained from: Nursing notes from this visit.  I ordered, reviewed and interpreted labs which include: CBC, BMP  I ordered imaging studies including patient declined CT head  Afebrile, hypertensive but otherwise hemodynamically stable.  67 year old female presenting for evaluation of a headache.  Describes the headache as her typical migraine.  She states she has been having her migraines more often than normal lately.  She states that the Botox injections have not worked as well as they typically do lately.  She reports trying her abortive medications at home without improvement.  Was told by her neurologist to come to the emergency department.  Has a specific regimen of medications that typically help with her headaches.  Her neurologic exam is reassuring.  No signs or symptoms of meningitis.  Given recent increase in headaches, I suggested a CT of the head.  Patient declined stating that this is her typical migraine presentation.  Overall low suspicion for [facility] given duration of symptoms.  Low suspicion for intraparenchymal bleed given reassuring physical exam.  Patient was treated in the emergency  department and reported significant improvement in her symptoms.  She was encouraged to follow-up with her neurologist as soon as possible for reevaluation of her recurrent headaches.  She was given return precautions.  Stable at discharge.  At this time there does not appear to be any evidence of an acute emergency  medical condition and the patient appears stable for discharge with appropriate outpatient follow up. Diagnosis was discussed with patient who verbalizes understanding of care plan and is agreeable to discharge. I have discussed return precautions with patient who verbalizes understanding. Patient encouraged to follow-up with their PCP within 1 week. All questions answered.  Note: Portions of this report may have been transcribed using voice recognition software. Every effort was made to ensure accuracy; however, inadvertent computerized transcription errors may still be present.         Final Clinical Impression(s) / ED Diagnoses Final diagnoses:  Bad headache    Rx / DC Orders ED Discharge Orders     None         Mora Bellman 07/10/23 2230    Arby Barrette, MD 07/10/23 2245

## 2023-07-10 NOTE — Discharge Instructions (Signed)
You have been seen today for your complaint of headache. Your lab work  was reassuring. Home care instructions are as follows:  Drink plenty of water.  Avoid triggers. Follow up with: Your neurologist as soon as possible. Please seek immediate medical care if you develop any of the following symptoms: Your migraine becomes really bad and medicine does not help. You have a fever or stiff neck. You have trouble seeing. Your muscles are weak or you lose control of them. You lose your balance or have trouble walking. You feel like you may faint or you faint. You start having sudden, very bad headaches. You have a seizure. At this time there does not appear to be the presence of an emergent medical condition, however there is always the potential for conditions to change. Please read and follow the below instructions.  Do not take your medicine if  develop an itchy rash, swelling in your mouth or lips, or difficulty breathing; call 911 and seek immediate emergency medical attention if this occurs.  You may review your lab tests and imaging results in their entirety on your MyChart account.  Please discuss all results of fully with your primary care provider and other specialist at your follow-up visit.  Note: Portions of this text may have been transcribed using voice recognition software. Every effort was made to ensure accuracy; however, inadvertent computerized transcription errors may still be present.

## 2023-07-10 NOTE — ED Triage Notes (Signed)
Pt has had a migraine for 2 days.  Pt is having N/V.  Pt has been taking Zofran but its not helping.  Pt took Nurtec today without relief.  Pt actively vomiting in triage.

## 2023-07-11 ENCOUNTER — Telehealth: Payer: Self-pay

## 2023-07-11 NOTE — Telephone Encounter (Signed)
Spoke with pt regarding monitor results. Pt had already followed up with Dr. Elberta Fortis and he had added Diltiazem to the Metoprolol 100 BID she was already taking. She was having no symptoms at this time. Routed to PCP.

## 2023-07-14 ENCOUNTER — Ambulatory Visit: Payer: Medicare (Managed Care) | Admitting: Behavioral Health

## 2023-07-14 ENCOUNTER — Encounter: Payer: Self-pay | Admitting: Behavioral Health

## 2023-07-14 DIAGNOSIS — F331 Major depressive disorder, recurrent, moderate: Secondary | ICD-10-CM | POA: Diagnosis not present

## 2023-07-14 DIAGNOSIS — F411 Generalized anxiety disorder: Secondary | ICD-10-CM | POA: Diagnosis not present

## 2023-07-14 NOTE — Progress Notes (Signed)
[facility] Counselor/Therapist Progress Note  Patient ID: Paula Stout, MRN: [id number],    Date: 07/14/2023  Time Spent: 58 minutes, 2:00 PM  to 2:58 spent with the patient.This session was held via video teletherapy. The patient consented to the video teletherapy and was located in her home during this session. She is aware it is the responsibility of the patient to secure confidentiality on her end of the session. The provider was in a private home  for the duration of this session.  The session began as a video session the Wi-Fi/Internet was sketchy so we had to finish via phone session.     Treatment Type: Individual Therapy  Reported Symptoms: Anxiety, depression  Mental Status Exam: Appearance:  Casual     Behavior: Appropriate  Motor: Normal  Speech/Language:  Normal Rate  Affect: Appropriate  Mood: normal  Thought process: normal  Thought content:   WNL  Sensory/Perceptual disturbances:   WNL  Orientation: oriented to person, place, time/date, situation, day of week, month of year, and year  Attention: Good  Concentration: Good  Memory: WNL  Fund of knowledge:  Good  Insight:   Good  Judgment:  Good  Impulse Control: Good   Risk Assessment: Danger to Self:  No Self-injurious Behavior: No Danger to Others: No Duty to Warn:no Physical Aggression / Violence:No  Access to Firearms a concern: No  Gang Involvement:No   Subjective: It has been a frustrating week for the patient.  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  Part it was her fault but it could have been called at a couple of different times.  Thankfully could not be fixed but the patient does not like making mistakes.  She is noticing more in the morning after she goes down to let her dog out and goes back in the house that she is having lightheaded and dizzy type spells.  One morning this week she had to sit down on the kitchen floor to avoid passing  out and had to sit there for 15 minutes.  Her son-in-law came by and ask while she was sitting on the floor but did not offer help and Going to do whatever he was doing.  Her daughter came in and saw it and did not offer to help but told her she needed to sit in her recliner.  She is trying to figure out what is causing those dizzy spells and has reached out to the electrical cardiac specialist and is waiting for an answer.  Medication changes that helped to slow down some of the palpitations for which she is thankful.  There is some anticipatory anxiety about the biopsy of her thyroid tomorrow.  She is not as concerned about the procedure as the outcome.  She knows it will be up to 2 weeks before she hears we talked about ways to cope with waiting for that answer.  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  They are going to look at the spot on her lung some time in November.  Her anxiety comes from the fact that her husband had cancer and died of cancer.  We talked about challenging those thoughts and waiting on answers and using coping skills in that way time. The patient did say that she felt some benefit from Lexapro as she is tapering up on that and off Viibryd.  She has been hesitant  to take the as needed anxiety medication as her mother had become addicted to it.  Encouraged her to have that conversation with her doctor. Plan: I will meet with the patient weekly via care agility.  Treatment plan: We will use cognitive behavioral therapy as well as person centered and supportive therapy in addition to elements of dialectical behavior therapy to help reduce the patient's anxiety and depression by at least 50% with a target date of October 06, 2023.  Goals for improving depression may include having less sadness as indicated by patient report and scores on the PHQ-9, have improved mood and return to a healthier level of functioning, identify causes including environment for  depressed mood and learn ways to cope with depression especially those connected to her medical issues.  Interventions include using cognitive behavioral therapy to explore and replace thoughts and behaviors.  We will look at how depression is experienced in day-to-day living and encouraged sharing of feelings.  We will encourage the use of coping skills for management of depressive symptoms.  Goals for reducing anxiety are to improve her ability to manage anxiety symptoms, better handle stress, identify causes for anxiety and explore ways to lower it, resolve the core conflicts contributing to anxiety as well as manage thoughts and worrisome thinking contributing to feelings of anxiety.  Interventions will include providing education about anxiety, facilitate problem solution skills to help her identify options for resolving stress, teach coping skills for managing anxiety as well as mindfulness and communication in terms of family to reduce her stress level, use cognitive behavior therapy to identify and change anxiety provoking thought and behavior patterns as well as teach distress tolerance and mindfulness skills for alleviating anxiety. Progress: 30%  [name], [facility]                  [name], [facility]               [name], [facility]               [name], [facility]               [name], [facility]               [name], [facility]               [name], [facility]               [name], [facility]               [name], [facility]               [name], [facility]         [name] Counselor/Therapist Progress Note  Patient ID: Paula Stout, MRN: [id number],    Date: 07/14/2023  Time Spent: 58 minutes, 1:00 PM  to 1:58 spent with the patient.This session was held via video teletherapy. The patient consented to  the video teletherapy and was located in her home during this session. She is aware it is the responsibility of the patient to secure confidentiality on her end of the session. The provider was in a private home  for the duration of this session.      Treatment Type: Individual Therapy  Reported Symptoms: Anxiety, depression  Mental Status Exam: Appearance:  Casual     Behavior: Appropriate  Motor: Normal  Speech/Language:  Normal Rate  Affect: Appropriate  Mood: normal  Thought process: normal  Thought content:   WNL  Sensory/Perceptual disturbances:   WNL  Orientation: oriented to person, place, time/date, situation, day of week, month of year, and year  Attention: Good  Concentration: Good  Memory: WNL  Fund of knowledge:  Good  Insight:   Good  Judgment:  Good  Impulse Control: Good   Risk Assessment: Danger to Self:  No Self-injurious Behavior: No Danger to Others: No Duty to Warn:no Physical Aggression / Violence:No  Access to Firearms a concern: No  Gang Involvement:No   Subjective: Since the last session the patient has had to go to the hospital for a migraine.  The medication that she has at home is not working as effectively now and she is seeing an increase in frequency and intensity of her migraines.  Every time she goes to the hospital for treatment and cost to $120 which she cannot afford to continue to do.  She is going to speak to her doctor about that and the next visit with him in a few weeks.  She has not been sleeping well and thinks that may be a part of why there is an increase in anxiety as well as the migraine headaches.  She says she tries to sleep some during the day but cannot fall asleep.  We talked about looking at a different time of the day to nap instead of around the middle of the day.  She is having difficulty focusing which she also attributes to anxiety and not sleeping well.  She is working on a project for her son which should have been done  earlier in the week but because of focus difficulty she hopes to finish today.  She did start her new mood stabilization medications last week but has not noticed any benefit yet understanding that it usually takes about 2 weeks for that.  There are complications in the home because that her daughter has some health issues so her daughter is not able to help her very much.  Things set as simple as changing her sheets her daughter is not helping her with which is frustrating for the patient.  Her daughter in [place] did confirm that they have tickets for her to go out there on December 17 coming home on January 5.  Her daughter who is in the healthcare field called to ask what was going on and she told her.  I encouraged her to think about a more permanent move out that way as soon as she gets some answers from tasks.  She did have a thyroid biopsy last week and will hear from that within 2 weeks.  They are going to take another CT scan of her lung as the doctor was not satisfied with the last 1 and see what they can see there.  There is anxiety with awaiting results but we talked about compartmentalization using coping skills and staying as busy as possible.  She still does not have much energy and goes downstairs just enough to let the dog out or get something to eat or drink and then come back up.   Plan: I will meet with the patient weekly via care agility.  Treatment plan: We will use cognitive behavioral therapy as well as person centered and supportive therapy in addition to elements of dialectical behavior therapy to help reduce the patient's anxiety and depression by at least 50% with a target date of October 06, 2023.  Goals for improving depression  may include having less sadness as indicated by patient report and scores on the PHQ-9, have improved mood and return to a healthier level of functioning, identify causes including environment for depressed mood and learn ways to cope with  depression especially those connected to her medical issues.  Interventions include using cognitive behavioral therapy to explore and replace thoughts and behaviors.  We will look at how depression is experienced in day-to-day living and encouraged sharing of feelings.  We will encourage the use of coping skills for management of depressive symptoms.  Goals for reducing anxiety are to improve her ability to manage anxiety symptoms, better handle stress, identify causes for anxiety and explore ways to lower it, resolve the core conflicts contributing to anxiety as well as manage thoughts and worrisome thinking contributing to feelings of anxiety.  Interventions will include providing education about anxiety, facilitate problem solution skills to help her identify options for resolving stress, teach coping skills for managing anxiety as well as mindfulness and communication in terms of family to reduce her stress level, use cognitive behavior therapy to identify and change anxiety provoking thought and behavior patterns as well as teach distress tolerance and mindfulness skills for alleviating anxiety. Progress: 30%  [name], [facility]                  [name], [facility]               [name], [facility]               [name], [facility]               [name], [facility]               [name], [facility]               [name], [facility]               [name], [facility]               [name], [facility]               [name], [facility]               [name], [facility]                      [facility] Counselor/Therapist Progress Note  Patient ID: Paula Stout, MRN: [id number],    Date: 07/14/2023  Time Spent: 58 minutes, 2:00 PM  to 2:58 spent with the patient.This session was held via video  teletherapy. The patient consented to the video teletherapy and was located in her home during this session. She is aware it is the responsibility of the patient to secure confidentiality on her end of the session. The provider was in a private home  for the duration of this session.  The session began as a video session the Wi-Fi/Internet was sketchy so we had to finish via phone session.     Treatment Type: Individual Therapy  Reported Symptoms: Anxiety, depression  Mental Status Exam: Appearance:  Casual     Behavior: Appropriate  Motor: Normal  Speech/Language:  Normal Rate  Affect: Appropriate  Mood: normal  Thought process: normal  Thought content:   WNL  Sensory/Perceptual disturbances:   WNL  Orientation: oriented to person, place, time/date, situation, day of week, month of year, and year  Attention:  Good  Concentration: Good  Memory: WNL  Fund of knowledge:  Good  Insight:   Good  Judgment:  Good  Impulse Control: Good   Risk Assessment: Danger to Self:  No Self-injurious Behavior: No Danger to Others: No Duty to Warn:no Physical Aggression / Violence:No  Access to Firearms a concern: No  Gang Involvement:No   Subjective: It has been a frustrating week for the patient.  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  Part it was her fault but it could have been called at a couple of different times.  Thankfully could not be fixed but the patient does not like making mistakes.  She is noticing more in the morning after she goes down to let her dog out and goes back in the house that she is having lightheaded and dizzy type spells.  One morning this week she had to sit down on the kitchen floor to avoid passing out and had to sit there for 15 minutes.  Her son-in-law came by and ask while she was sitting on the floor but did not offer help and Going to do whatever he was doing.  Her daughter came in and saw it and did not offer to help but told her she  needed to sit in her recliner.  She is trying to figure out what is causing those dizzy spells and has reached out to the electrical cardiac specialist and is waiting for an answer.  Medication changes that helped to slow down some of the palpitations for which she is thankful.  There is some anticipatory anxiety about the biopsy of her thyroid tomorrow.  She is not as concerned about the procedure as the outcome.  She knows it will be up to 2 weeks before she hears we talked about ways to cope with waiting for that answer.  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  They are going to look at the spot on her lung some time in November.  Her anxiety comes from the fact that her husband had cancer and died of cancer.  We talked about challenging those thoughts and waiting on answers and using coping skills in that way time. The patient did say that she felt some benefit from Lexapro as she is tapering up on that and off Viibryd.  She has been hesitant to take the as needed anxiety medication as her mother had become addicted to it.  Encouraged her to have that conversation with her doctor. Plan: I will meet with the patient weekly via care agility.  Treatment plan: We will use cognitive behavioral therapy as well as person centered and supportive therapy in addition to elements of dialectical behavior therapy to help reduce the patient's anxiety and depression by at least 50% with a target date of October 06, 2023.  Goals for improving depression may include having less sadness as indicated by patient report and scores on the PHQ-9, have improved mood and return to a healthier level of functioning, identify causes including environment for depressed mood and learn ways to cope with depression especially those connected to her medical issues.  Interventions include using cognitive behavioral therapy to explore and replace thoughts and behaviors.  We will look at how depression is  experienced in day-to-day living and encouraged sharing of feelings.  We will encourage the use of coping skills for management of depressive symptoms.  Goals for reducing anxiety are to improve her ability  to manage anxiety symptoms, better handle stress, identify causes for anxiety and explore ways to lower it, resolve the core conflicts contributing to anxiety as well as manage thoughts and worrisome thinking contributing to feelings of anxiety.  Interventions will include providing education about anxiety, facilitate problem solution skills to help her identify options for resolving stress, teach coping skills for managing anxiety as well as mindfulness and communication in terms of family to reduce her stress level, use cognitive behavior therapy to identify and change anxiety provoking thought and behavior patterns as well as teach distress tolerance and mindfulness skills for alleviating anxiety. Progress: 30%  [name], [place]                  [name], [facility]               [name], [facility]               [name], [facility]               [name], [facility]               [name], [facility]               [name], [facility]               [name], [facility]               [name], [facility]               [name], [facility]         [facility] Counselor/Therapist Progress Note  Patient ID: Paula Stout, MRN: [id number],    Date: 07/14/2023  Time Spent: 58 minutes, 1:00 PM  to 1:58 spent with the patient.This session was held via video teletherapy. The patient consented to the video teletherapy and was located in her home during this session. She is aware it is the responsibility of the patient to secure confidentiality on her end of the session. The provider was in a private home  for the duration of this  session.      Treatment Type: Individual Therapy  Reported Symptoms: Anxiety, depression  Mental Status Exam: Appearance:  Casual     Behavior: Appropriate  Motor: Normal  Speech/Language:  Normal Rate  Affect: Appropriate  Mood: normal  Thought process: normal  Thought content:   WNL  Sensory/Perceptual disturbances:   WNL  Orientation: oriented to person, place, time/date, situation, day of week, month of year, and year  Attention: Good  Concentration: Good  Memory: WNL  Fund of knowledge:  Good  Insight:   Good  Judgment:  Good  Impulse Control: Good   Risk Assessment: Danger to Self:  No Self-injurious Behavior: No Danger to Others: No Duty to Warn:no Physical Aggression / Violence:No  Access to Firearms a concern: No  Gang Involvement:No   Subjective: The patient did meet with her psychiatrist and they are going to start Lexapro and Ativan tomorrow because the Viibryd was not promoting any mood elevation.  She told him and we processed that she had a manic episode a few days ago and spent significant amount of money.  It is not things that she will use for her doll making outfit but not money that she needed to spend.  She did send some of the things back.  We talked about ways that she could reduce the temptation  especially in a manic phase such as delaying the Her Phone, How to Distract Herself, Cognitive Reframing Etc.  She Has Come Out Of the Manic Episode and She and the Doctor Optimistic the New Medication Will Help Stabilize That.  She Is Scheduled for a Thyroid Biopsy on the 31st.  She Is Not Necessarily Anxious about the Procedure but Anxious about What They Might Find.  The Ear Nose and Throat Specialist Could Not Say without Actually Having a Biopsy What That Might Look like.  She Said She Would Hear within 2 Weeks the Results of That Biopsy.  We Talked about Compartmentalization and Not Projecting.  She Is At [facility] Experiencing a Reduced Heart Rate so the  Palpitations Although Still Present Are Not As Intense but Still Uncomfortable.  It Is Complicated by the Environment That She Is Living in.  She Is Having to Feed Her Dog Upstairs Because Her Daughter's Dogs Are Taking Her Dog's Dog Food Which Is Specially for Him.  She Is Trying to Get outside As Regularly As She Can to Sit outside with Her Dog since It Is Nice but It Takes a Significant Amount of Energy to Get up and down the Stairs.  Her Daughter Reminded Her As Did I to Practice Her Coping Skills and We Reviewed Those Even When the Anxiety Is Manageable so That They Become a Natural Part of Her Response to Anxiety When She Needs It.  Plan: I will meet with the patient weekly via care agility.  Treatment plan: We will use cognitive behavioral therapy as well as person centered and supportive therapy in addition to elements of dialectical behavior therapy to help reduce the patient's anxiety and depression by at least 50% with a target date of October 06, 2023.  Goals for improving depression may include having less sadness as indicated by patient report and scores on the PHQ-9, have improved mood and return to a healthier level of functioning, identify causes including environment for depressed mood and learn ways to cope with depression especially those connected to her medical issues.  Interventions include using cognitive behavioral therapy to explore and replace thoughts and behaviors.  We will look at how depression is experienced in day-to-day living and encouraged sharing of feelings.  We will encourage the use of coping skills for management of depressive symptoms.  Goals for reducing anxiety are to improve her ability to manage anxiety symptoms, better handle stress, identify causes for anxiety and explore ways to lower it, resolve the core conflicts contributing to anxiety as well as manage thoughts and worrisome thinking contributing to feelings of anxiety.  Interventions will include providing  education about anxiety, facilitate problem solution skills to help her identify options for resolving stress, teach coping skills for managing anxiety as well as mindfulness and communication in terms of family to reduce her stress level, use cognitive behavior therapy to identify and change anxiety provoking thought and behavior patterns as well as teach distress tolerance and mindfulness skills for alleviating anxiety. Progress: 30%  [name], [facility]                  [name], [facility]               [name], [facility]               [name], [facility]               [name], [facility]  [name], [facility]               [name], [facility]               [name], [facility]               [name], [facility]               [name], [facility]               [name], [facility]  [name], [facility]

## 2023-07-18 ENCOUNTER — Encounter: Payer: Self-pay | Admitting: Behavioral Health

## 2023-07-18 ENCOUNTER — Other Ambulatory Visit: Payer: Self-pay | Admitting: Family Medicine

## 2023-07-18 ENCOUNTER — Telehealth: Payer: Medicare (Managed Care) | Admitting: Behavioral Health

## 2023-07-18 DIAGNOSIS — F411 Generalized anxiety disorder: Secondary | ICD-10-CM

## 2023-07-18 DIAGNOSIS — F3130 Bipolar disorder, current episode depressed, mild or moderate severity, unspecified: Secondary | ICD-10-CM

## 2023-07-18 DIAGNOSIS — F319 Bipolar disorder, unspecified: Secondary | ICD-10-CM

## 2023-07-18 MED ORDER — QUETIAPINE FUMARATE 300 MG PO TABS: 300.0000 mg | ORAL_TABLET | Freq: Every day | ORAL | 3 refills | Status: DC

## 2023-07-18 MED ORDER — LORAZEPAM 0.5 MG PO TABS: 0.5000 mg | ORAL_TABLET | Freq: Three times a day (TID) | ORAL | 1 refills | Status: DC

## 2023-07-18 MED ORDER — ESCITALOPRAM OXALATE 10 MG PO TABS: 10.0000 mg | ORAL_TABLET | Freq: Every day | ORAL | 1 refills | Status: DC

## 2023-07-18 NOTE — Progress Notes (Signed)
Paula Stout [id number] Dec 02, 1955 67 y.o.  Virtual Visit via Video Note  I connected with pt @ on 07/18/23 at  1:30 PM EST by a video enabled telemedicine application and verified that I am speaking with the correct person using two identifiers.   I discussed the limitations of evaluation and management by telemedicine and the availability of in person appointments. The patient expressed understanding and agreed to proceed.  I discussed the assessment and treatment plan with the patient. The patient was provided an opportunity to ask questions and all were answered. The patient agreed with the plan and demonstrated an understanding of the instructions.   The patient was advised to call back or seek an in-person evaluation if the symptoms worsen or if the condition fails to improve as anticipated.  I provided 20 minutes of non-face-to-face time during this encounter.  The patient was located at home.  The provider was located at [facility].   Joan Flores, NP   Subjective:   Patient ID:  Paula Stout is a 67 y.o. (DOB 1956-02-23) female.  Chief Complaint:  Chief Complaint  Patient presents with   Anxiety   Depression   Follow-up   Patient Education   Medication Refill   Manic Behavior    HPI "Paula Stout", 67 year old female presents to this office via video visit for follow up and medication management.  Collateral information should be considered reliable. She has some anxiety at times due to worry about thyroid. She is going for biopsy on Dec. 5th. No recent mania. She reports her depression today at 3/10, and anxiety at 4/10.  She is sleeping 7 to 8 hours per night with the aid of Seroquel.  She denies any current mania, no history of psychosis or auditory or visual hallucinations.  No SI or HI.  Patient states that she feels safe and verbally contracts for safety today.   Past psychiatric medication  trials: Zoloft Prozac Cymbalta Lithium Depakote Valium Ativan Gabapentin Trazodone Ambien   Review of Systems:  Review of Systems  Constitutional: Negative.   Allergic/Immunologic: Negative.   Psychiatric/Behavioral:  Positive for dysphoric mood. The patient is nervous/anxious.     Medications: I have reviewed the patient's current medications.  Current Outpatient Medications  Medication Sig Dispense Refill   acetaminophen (TYLENOL) 500 MG tablet Take 1,000 mg by mouth every 6 (six) hours as needed for mild pain.     albuterol (VENTOLIN HFA) 108 (90 Base) MCG/ACT inhaler Inhale 2 puffs into the lungs every 4 (four) hours as needed for wheezing. 1 each 2   Ascorbic Acid (VITAMIN C PO) Take 1 tablet by mouth daily.     atorvastatin (LIPITOR) 80 MG tablet TAKE 1 TABLET DAILY 90 tablet 3   blood glucose meter kit and supplies One Touch Ultra  Check 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 One Touch Ultra  Check blood sugars once daily Dx: E11.9) 1 each 0   botulinum toxin Type A (BOTOX) 200 units injection Inject 155 units IM into multiple site in the face,neck and head once every 90 days (Patient taking differently: Inject 155 Units into the muscle See admin instructions. Inject 155 units IM into multiple site in the face,neck and head once every 90 days) 1 each 4   buPROPion (WELLBUTRIN XL) 300 MG 24 hr tablet Take 1 tablet (300 mg total) by mouth daily. 90 tablet 3   Cholecalciferol (VITAMIN D3) 125 MCG (5000 UT)  capsule Take 5,000 Units by mouth daily.     Cyanocobalamin (VITAMIN B-12) 5000 MCG TBDP Take 5,000 mcg by mouth daily.     dapagliflozin propanediol (FARXIGA) 10 MG TABS tablet Take 1 tablet (10 mg total) by mouth daily. 90 tablet 1   diltiazem (CARDIZEM CD) 120 MG 24 hr capsule Take 1 capsule (120 mg total) by mouth daily. 30 capsule 3   diltiazem (CARDIZEM) 30 MG tablet Take 1 tablet every 4 hours AS  NEEDED for AFIB or SVT heart rate >95 as long as top BP >100. (Patient taking differently: Take 30 mg by mouth See admin instructions. Take 1 tablet every 4 hours AS NEEDED for AFIB or SVT heart rate >95 as long as top BP >100.) 30 tablet 1   ELIQUIS 5 MG TABS tablet TAKE 1 TABLET TWICE A DAY 180 tablet 3   EPINEPHrine 0.3 mg/0.3 mL IJ SOAJ injection Inject 0.3 mg into the muscle as needed for anaphylaxis. 1 each 1   escitalopram (LEXAPRO) 10 MG tablet Take 1 tablet (10 mg total) by mouth daily. 30 tablet 1   ezetimibe (ZETIA) 10 MG tablet TAKE 1 TABLET DAILY 90 tablet 3   fenofibrate 160 MG tablet Take 1 tablet (160 mg total) by mouth daily. 90 tablet 3   fluconazole (DIFLUCAN) 150 MG tablet Take 1 tab, repeat in 72 hours if no improvement. (Patient taking differently: Take 150 mg by mouth See admin instructions. Take 1 tab, repeat in 72 hours if no improvement.) 2 tablet 0   fluticasone (FLONASE) 50 MCG/ACT nasal spray Place 2 sprays into both nostrils daily. (Patient taking differently: Place 2 sprays into both nostrils 3 (three) times a week.) 16 g 6   levocetirizine (XYZAL) 5 MG tablet TAKE 1 TABLET EVERY EVENING (Patient taking differently: Take 5 mg by mouth every evening.) 90 tablet 3   LORazepam (ATIVAN) 0.5 MG tablet Take 1 tablet (0.5 mg total) by mouth every 8 (eight) hours. 30 tablet 1   Lurasidone HCl 120 MG TABS Take 1 tablet (120 mg total) by mouth at bedtime. 90 tablet 3   magnesium oxide (MAG-OX) 400 (240 Mg) MG tablet Take 400 mg by mouth daily.     metFORMIN (GLUCOPHAGE-XR) 500 MG 24 hr tablet TAKE 1 TABLET TWICE A DAY WITH MEALS (DECREASED IN DOSE) (Patient taking differently: Take 500 mg by mouth daily with breakfast.) 180 tablet 3   metoCLOPramide (REGLAN) 10 MG tablet Take 1 tablet (10 mg total) by mouth every 6 (six) hours. 15 tablet 0   metoprolol succinate (TOPROL-XL) 100 MG 24 hr tablet Take 1 tablet (100 mg total) by mouth 2 (two) times daily. Take with or immediately  following a meal. 180 tablet 3   metoprolol tartrate (LOPRESSOR) 25 MG tablet Take 1 tablet (25 mg total) by mouth every 4 (four) hours as needed (PALPITATIONS). 90 tablet 3   Multiple Vitamins-Minerals (MULTIVITAMIN WITH MINERALS) tablet Take 1 tablet by mouth daily.     nitroGLYCERIN (NITROSTAT) 0.4 MG SL tablet Place 1 tablet (0.4 mg total) under the tongue every 5 (five) minutes as needed for chest pain. 25 tablet 11   ondansetron (ZOFRAN-ODT) 4 MG disintegrating tablet Take 1 tablet (4 mg total) by mouth every 8 (eight) hours as needed for nausea or vomiting. 20 tablet 0   ONETOUCH VERIO test strip USE DAILY TO CHECK BLOOD SUGAR (Patient taking differently: 1 each by Other route as needed for other (see below). Use daily to check blood sugar.)  100 strip 3   pantoprazole (PROTONIX) 40 MG tablet Take 1 tablet (40 mg total) by mouth daily. 90 tablet 3   QUEtiapine (SEROQUEL) 300 MG tablet Take 1 tablet (300 mg total) by mouth at bedtime. 90 tablet 3   Rimegepant Sulfate (NURTEC) 75 MG TBDP Take 1 tablet (75 mg total) by mouth as needed (take 1 tab at the earlist onset of a migraine. Max 1 tab in 24 hours). 10 tablet 5   tirzepatide (MOUNJARO) 10 MG/0.5ML Pen Inject 10 mg into the skin once a week for 28 days. 2 mL 0   [START ON 07/26/2023] tirzepatide (MOUNJARO) 12.5 MG/0.5ML Pen Inject 12.5 mg into the skin once a week. 2 mL 0   topiramate (TOPAMAX) 50 MG tablet TAKE 3 TABLETS TWICE A DAY (Patient taking differently: Take 150 mg by mouth 2 (two) times daily.) 180 tablet 11   No current facility-administered medications for this visit.    Medication Side Effects: None  Allergies:  Allergies  Allergen Reactions   Bee Venom Anaphylaxis   Onion Other (See Comments)    Respiratory Distress   Tetanus-Diphtheria Toxoids Td Anaphylaxis   Sumatriptan     Passed out, nose bleed    Zolpidem Other (See Comments)    Causes sleep walking   Asa [Aspirin] Nausea And Vomiting   Iodine Rash   Nsaids  Nausea Only    Rash, hives and trouble breathing   Sulfa Antibiotics Rash   Vancomycin Rash    Other Reaction(s): STEVENS-JOHNSON    Past Medical History:  Diagnosis Date   ACS (acute coronary syndrome) (HCC) 07/31/2021   Adhesive capsulitis of left shoulder 12/16/2016   Asthma without status asthmaticus 02/06/2021   At risk for falls 02/04/2019   Atrial fibrillation (HCC) 09/01/2014   Last Assessment & Plan:  Formatting of this note might be different from the original. A:  Chronic.  Sinus rhythm at this time.  States she was told this during admission at [facility] in the past. P:  On baby aspirin at home will continue.  Low dose metoprolol started.   Bipolar 1 disorder, depressed, moderate (HCC) 02/18/2020   Capsulitis of left shoulder 10/20/2021   Chronic anticoagulation 02/20/2020   Chronic atrial fibrillation (HCC) 02/18/2020   Chronic headache 10/11/2013   Chronic interstitial cystitis 12/28/2005   Chronic migraine without aura, intractable, without status migrainosus 06/24/2017   Colon cancer screening 04/15/2016   Last Assessment & Plan:  Formatting of this note might be different from the original. Will schedule for colonoscopy   Depression, recurrent (HCC) 02/18/2020   Diabetes mellitus (HCC)    Dyslipidemia 02/20/2020   GAD (generalized anxiety disorder) 02/18/2020   GERD (gastroesophageal reflux disease) 11/05/2021   History of atrial fibrillation 06/24/2017   History of posttraumatic stress disorder (PTSD) 10/07/2016   Hyperosmolarity due to secondary diabetes mellitus (HCC) 02/06/2021   Impaired mobility and activities of daily living 11/14/2015   Irritable bowel syndrome (IBS)    Junctional tachycardia (HCC) 11/05/2021   Kidney stone 02/06/2021   Migraine without aura, not refractory 02/06/2021   Mixed hyperlipidemia 04/06/2021   Last Assessment & Plan:  Formatting of this note might be different from the original. Due for labs with PCP in November   Morbid  obesity (HCC) 12/16/2016   Paroxysmal supraventricular tachycardia (HCC) 11/05/2021   Pneumonia, organism unspecified(486) 11/21/2002   Polyneuropathy due to type 2 diabetes mellitus (HCC) 02/06/2021   Primary hypertension 04/06/2021   Last Assessment & Plan:  Formatting of this note might be different from the original. Controlled   Refractory migraine with aura 02/06/2021   Refractory migraine without aura 02/06/2021   Renal colic on left side 04/22/2017   Stage 3 chronic kidney disease (HCC) 02/20/2020   Unspecified adverse effect of other drug, medicinal and biological substance(995.29) 02/17/2006   Urinary tract obstruction 02/06/2021    Family History  Adopted: Yes  Problem Relation Age of Onset   Migraines Daughter    Bipolar disorder Daughter    Migraines Daughter    Migraines Son    Bipolar disorder Son     Social History   Socioeconomic History   Marital status: Widowed    Spouse name: Not on file   Number of children: 6   Years of education: Not on file   Highest education level: Associate degree: academic program  Occupational History   Occupation: not employed  Tobacco Use   Smoking status: Never   Smokeless tobacco: Never   Tobacco comments:    Never smoke 07/15/22  Vaping Use   Vaping status: Never Used  Substance and Sexual Activity   Alcohol use: Never    Comment: light social   Drug use: Never   Sexual activity: Not Currently  Other Topics Concern   Not on file  Social History Narrative   Lives in [place] with daughter and son-in-law.  Sews clothing in free time as hobby.    Social Determinants of Health   Financial Resource Strain: Medium Risk (02/16/2023)   Overall Financial Resource Strain (CARDIA)    Difficulty of Paying Living Expenses: Somewhat hard  Food Insecurity: No Food Insecurity (02/16/2023)   Hunger Vital Sign    Worried About Running Out of Food in the Last Year: Never true    Ran Out of Food in the Last Year: Never true   Transportation Needs: No Transportation Needs (02/16/2023)   PRAPARE - Administrator, Civil Service (Medical): No    Lack of Transportation (Non-Medical): No  Physical Activity: Unknown (02/16/2023)   Exercise Vital Sign    Days of Exercise per Week: 2 days    Minutes of Exercise per Session: Patient declined  Stress: Stress Concern Present (02/16/2023)   [employer] of Occupational Health - Occupational Stress Questionnaire    Feeling of Stress : To some extent  Social Connections: Socially Isolated (02/16/2023)   Social Connection and Isolation Panel [NHANES]    Frequency of Communication with Friends and Family: More than three times a week    Frequency of Social Gatherings with Friends and Family: Never    Attends Religious Services: Never    Database administrator or Organizations: No    Attends Engineer, structural: Not on file    Marital Status: Widowed  Intimate Partner Violence: Not At Risk (10/27/2022)   Humiliation, Afraid, Rape, and Kick questionnaire    Fear of Current or Ex-Partner: No    Emotionally Abused: No    Physically Abused: No    Sexually Abused: No    Past Medical History, Surgical history, Social history, and Family history were reviewed and updated as appropriate.   Please see review of systems for further details on the patient's review from today.   Objective:   Physical Exam:  There were no vitals taken for this visit.  Physical Exam Neurological:     Mental Status: She is alert and oriented to person, place, and time.  Psychiatric:  Attention and Perception: Attention and perception normal.        Mood and Affect: Mood normal.        Speech: Speech normal.        Behavior: Behavior normal. Behavior is cooperative.        Cognition and Memory: Cognition and memory normal.        Judgment: Judgment normal.     Comments: Insight intact     Lab Review:     Component Value Date/Time   NA 140 07/10/2023 1948    NA 141 04/20/2023 1541   K 4.0 07/10/2023 1948   CL 107 07/10/2023 1948   CO2 20 (L) 07/10/2023 1948   GLUCOSE 110 (H) 07/10/2023 1948   BUN 24 (H) 07/10/2023 1948   BUN 14 04/20/2023 1541   CREATININE 1.98 (H) 07/10/2023 1948   CALCIUM 9.7 07/10/2023 1948   PROT 7.0 06/01/2023 1751   PROT 6.7 02/10/2021 0914   ALBUMIN 3.5 06/01/2023 1751   ALBUMIN 3.9 02/10/2021 0914   AST 20 06/01/2023 1751   ALT 14 06/01/2023 1751   ALKPHOS 51 06/01/2023 1751   BILITOT 0.5 06/01/2023 1751   BILITOT 0.3 02/10/2021 0914   GFRNONAA 28 (L) 07/10/2023 1948   GFRAA 39 (L) 04/22/2020 1726       Component Value Date/Time   WBC 8.1 07/10/2023 1948   RBC 4.10 07/10/2023 1948   HGB 12.3 07/10/2023 1948   HGB 12.4 04/20/2023 1537   HCT 38.3 07/10/2023 1948   HCT 38.3 04/20/2023 1537   PLT 295 07/10/2023 1948   PLT 324 04/20/2023 1537   MCV 93.4 07/10/2023 1948   MCV 92 04/20/2023 1537   MCH 30.0 07/10/2023 1948   MCHC 32.1 07/10/2023 1948   RDW 15.2 07/10/2023 1948   RDW 13.7 04/20/2023 1537   LYMPHSABS 2.2 07/10/2023 1948   LYMPHSABS 1.9 10/13/2022 1002   MONOABS 0.7 07/10/2023 1948   EOSABS 0.3 07/10/2023 1948   EOSABS 0.3 10/13/2022 1002   BASOSABS 0.1 07/10/2023 1948   BASOSABS 0.1 10/13/2022 1002    No results found for: "POCLITH", "LITHIUM"   No results found for: "PHENYTOIN", "PHENOBARB", "VALPROATE", "CBMZ"   .res Assessment: Plan:     Greater than 50% of 30 min video visit time with patient was spent on counseling and coordination of care. We discussed her recent health concerns as well as her medications. Talked about her concerns that Viibryd is not working and she is to the point where she would like to try another medication. Patient has history of SVTs.  She had   cardiac ablation on August 28.  Still having problems reports intermittent chest pain with activity. She is currently taking higher doses of metoprolol to mediate cardiac condition.  Also has history of A-Fib.   Advised patient to f/u and  clear any new medication with cardiologist.   We agreed today to:   Will continue Seroquel 300 mg at bedtime daily Will continue Latuda 120 mg daily To continue Wellbutrin 300 mg daily Will reduce Viibryd to 20 mg for one week, then 10 mg for one week, then stop To start Lexapro 10 mg daily after breakfast  Will report worsening symptoms promptly Provided emergency contact information Discussed potential metabolic side effects associated with atypical antipsychotics, as well as potential risk for movement side effects. Advised pt to contact office if movement side effects occur.   Reviewed PDMP        Eliyanna "Paula Stout" was seen today for  anxiety, depression, follow-up, patient education, medication refill and manic behavior.  Diagnoses and all orders for this visit:  Bipolar 1 disorder (HCC) -     QUEtiapine (SEROQUEL) 300 MG tablet; Take 1 tablet (300 mg total) by mouth at bedtime.  Bipolar I disorder, most recent episode depressed (HCC) -     escitalopram (LEXAPRO) 10 MG tablet; Take 1 tablet (10 mg total) by mouth daily. -     LORazepam (ATIVAN) 0.5 MG tablet; Take 1 tablet (0.5 mg total) by mouth every 8 (eight) hours.  Generalized anxiety disorder -     escitalopram (LEXAPRO) 10 MG tablet; Take 1 tablet (10 mg total) by mouth daily. -     LORazepam (ATIVAN) 0.5 MG tablet; Take 1 tablet (0.5 mg total) by mouth every 8 (eight) hours.     Please see After Visit Summary for patient specific instructions.  Future Appointments  Date Time Provider Department Center  07/19/2023  2:00 PM Georgeanna Lea, MD [facility] None  07/27/2023 10:30 AM [facility] 1 [facility]  07/28/2023 11:30 AM Shon Millet R, DO [facility] None  07/28/2023  3:00 PM [name], [facility] None  08/08/2023 10:00 AM [facility] PHARMACIST [facility]  08/10/2023  2:00 PM [name], [facility] None  08/11/2023  9:30 AM Allison Quarry, Ruby Cola, NP [facility]UL[facility]  None  08/17/2023  2:00 PM [name], [facility] None  08/19/2023 10:50 AM Drema Dallas, DO [facility] None  09/26/2023  3:30 PM Regan Lemming, MD [facility] None  11/28/2023 10:45 AM Wendling, Jilda Roche, DO [facility] P[facility]    No orders of the defined types were placed in this encounter.     -------------------------------

## 2023-07-19 ENCOUNTER — Ambulatory Visit: Payer: Medicare (Managed Care) | Admitting: Cardiology

## 2023-07-19 ENCOUNTER — Other Ambulatory Visit: Payer: Self-pay | Admitting: Cardiology

## 2023-07-19 ENCOUNTER — Ambulatory Visit: Payer: Medicare (Managed Care) | Admitting: Behavioral Health

## 2023-07-19 MED ORDER — DILTIAZEM HCL ER COATED BEADS 120 MG PO CP24: 120.0000 mg | ORAL_CAPSULE | Freq: Every day | ORAL | 3 refills | Status: DC

## 2023-07-22 ENCOUNTER — Telehealth: Payer: Self-pay

## 2023-07-22 NOTE — Telephone Encounter (Signed)
PA needed for Botox. Expired 05/25/23

## 2023-07-26 ENCOUNTER — Emergency Department (HOSPITAL_BASED_OUTPATIENT_CLINIC_OR_DEPARTMENT_OTHER)
Admission: EM | Admit: 2023-07-26 | Discharge: 2023-07-26 | Disposition: A | Discharge status: Home / Self Care | Payer: Medicare (Managed Care) | Attending: Emergency Medicine | Admitting: Emergency Medicine

## 2023-07-26 ENCOUNTER — Emergency Department (HOSPITAL_BASED_OUTPATIENT_CLINIC_OR_DEPARTMENT_OTHER): Payer: Medicare (Managed Care)

## 2023-07-26 ENCOUNTER — Encounter (HOSPITAL_BASED_OUTPATIENT_CLINIC_OR_DEPARTMENT_OTHER): Payer: Self-pay | Admitting: Urology

## 2023-07-26 ENCOUNTER — Other Ambulatory Visit: Payer: Self-pay

## 2023-07-26 DIAGNOSIS — R079 Chest pain, unspecified: Secondary | ICD-10-CM | POA: Diagnosis not present

## 2023-07-26 DIAGNOSIS — R519 Headache, unspecified: Secondary | ICD-10-CM | POA: Diagnosis not present

## 2023-07-26 DIAGNOSIS — R0789 Other chest pain: Secondary | ICD-10-CM | POA: Diagnosis not present

## 2023-07-26 LAB — CBC
HCT: 37.6 % (ref 36.0–46.0)
Hemoglobin: 12.2 g/dL (ref 12.0–15.0)
MCH: 30.3 pg (ref 26.0–34.0)
MCHC: 32.4 g/dL (ref 30.0–36.0)
MCV: 93.5 fL (ref 80.0–100.0)
Platelets: 300 10*3/uL (ref 150–400)
RBC: 4.02 MIL/uL (ref 3.87–5.11)
RDW: 15.2 % (ref 11.5–15.5)
WBC: 7.6 10*3/uL (ref 4.0–10.5)
nRBC: 0 % (ref 0.0–0.2)

## 2023-07-26 LAB — BASIC METABOLIC PANEL
Anion gap: 11 (ref 5–15)
BUN: 22 mg/dL (ref 8–23)
CO2: 16 mmol/L — ABNORMAL LOW (ref 22–32)
Calcium: 9.5 mg/dL (ref 8.9–10.3)
Chloride: 109 mmol/L (ref 98–111)
Creatinine, Ser: 2.05 mg/dL — ABNORMAL HIGH (ref 0.44–1.00)
GFR, Estimated: 26 mL/min — ABNORMAL LOW (ref >60)
Glucose, Bld: 103 mg/dL — ABNORMAL HIGH (ref 70–99)
Potassium: 3.6 mmol/L (ref 3.5–5.1)
Sodium: 136 mmol/L (ref 135–145)

## 2023-07-26 LAB — TROPONIN I (HIGH SENSITIVITY)
Troponin I (High Sensitivity): 11 ng/L (ref <18)
Troponin I (High Sensitivity): 10 ng/L (ref <18)

## 2023-07-26 MED ORDER — PROCHLORPERAZINE EDISYLATE 10 MG/2ML IJ SOLN
INTRAMUSCULAR | Status: AC
  Filled 2023-07-26: qty 2

## 2023-07-26 MED ORDER — DEXAMETHASONE SODIUM PHOSPHATE 10 MG/ML IJ SOLN
INTRAMUSCULAR | Status: AC
  Filled 2023-07-26: qty 1

## 2023-07-26 MED ORDER — DIPHENHYDRAMINE HCL 50 MG/ML IJ SOLN
INTRAMUSCULAR | Status: AC
  Filled 2023-07-26: qty 1

## 2023-07-26 MED ORDER — LACTATED RINGERS IV BOLUS
1000.0000 mL | Freq: Once | INTRAVENOUS | Status: AC
Start: 2023-07-26 — End: 2023-07-26
  Administered 2023-07-26: 1000 mL via INTRAVENOUS

## 2023-07-26 MED ORDER — DEXAMETHASONE SODIUM PHOSPHATE 10 MG/ML IJ SOLN
10.0000 mg | Freq: Once | INTRAMUSCULAR | Status: AC
Start: 2023-07-26 — End: 2023-07-26
  Administered 2023-07-26: 10 mg via INTRAVENOUS

## 2023-07-26 MED ORDER — DIPHENHYDRAMINE HCL 50 MG/ML IJ SOLN
50.0000 mg | Freq: Once | INTRAMUSCULAR | Status: AC
  Administered 2023-07-26: 50 mg via INTRAVENOUS

## 2023-07-26 MED ORDER — PROCHLORPERAZINE EDISYLATE 10 MG/2ML IJ SOLN
10.0000 mg | Freq: Once | INTRAMUSCULAR | Status: AC
  Administered 2023-07-26: 10 mg via INTRAVENOUS

## 2023-07-26 NOTE — ED Triage Notes (Signed)
Pt states headache and chest pain , pt states headache started 2 days ago and CP started this afternoon  Reports N/V, vomiting at triage  Was seen on 11/3 for same  Cardiac history of ablasions in 03/2023

## 2023-07-26 NOTE — ED Provider Notes (Signed)
[place] EMERGENCY DEPARTMENT AT [facility] [place] Provider Note   CSN: [id number] Arrival date & time: 07/26/23  1932     History No chief complaint on file.   HPI Paula Stout is a 67 y.o. female presenting for headache and chest pain.  Very similar to past presentations. - History of Present Illness: Headache started 2 days ago, described as a spike running through the back to the front of the head on the right side. Similar to previous episodes. Current headache not relieved by last Botox injection. Has an upcoming appointment with Dr. Everlena Cooper to discuss treatment options. Uses Nurtec and Zofran for headache relief. Frequent ER visits for headaches, with this being the 4th visit in a month. Chronic chest pain, currently exacerbated. History of controlled heart rate with metoprolol and Cartizine, though ineffective today. Heart rate typically in 50s-60s, but peaked at 132 bpm today.   Patient's recorded medical, surgical, social, medication list and allergies were reviewed in the Snapshot window as part of the initial history.   Review of Systems   Review of Systems  Constitutional:  Negative for chills and fever.  HENT:  Negative for ear pain and sore throat.   Eyes:  Negative for pain and visual disturbance.  Respiratory:  Negative for cough, shortness of breath and wheezing.   Cardiovascular:  Positive for chest pain and palpitations. Negative for leg swelling.  Gastrointestinal:  Negative for abdominal pain and vomiting.  Genitourinary:  Negative for dysuria and hematuria.  Musculoskeletal:  Negative for arthralgias and back pain.  Skin:  Negative for color change and rash.  Neurological:  Positive for headaches. Negative for seizures and syncope.  All other systems reviewed and are negative.   Physical Exam Updated Vital Signs BP 117/83   Pulse 95   Temp 98.2 F (36.8 C)   Resp 15   Ht 5\' 6"  (1.676 m)   Wt 92.9 kg   SpO2 99%   BMI 33.06 kg/m  Physical  Exam Constitutional:      General: She is not in acute distress.    Appearance: She is not ill-appearing or toxic-appearing.  HENT:     Head: Normocephalic and atraumatic.  Eyes:     Extraocular Movements: Extraocular movements intact.     Pupils: Pupils are equal, round, and reactive to light.  Cardiovascular:     Rate and Rhythm: Normal rate.  Pulmonary:     Effort: No respiratory distress.  Abdominal:     General: Abdomen is flat.  Musculoskeletal:        General: No swelling, deformity or signs of injury.     Cervical back: Normal range of motion. No rigidity.  Skin:    General: Skin is warm and dry.  Neurological:     General: No focal deficit present.     Mental Status: She is alert and oriented to person, place, and time.  Psychiatric:        Mood and Affect: Mood normal.      ED Course/ Medical Decision Making/ A&P    Procedures Procedures   Medications Ordered in ED Medications  prochlorperazine (COMPAZINE) injection 10 mg (10 mg Intravenous Given 07/26/23 2204)  diphenhydrAMINE (BENADRYL) injection 50 mg (50 mg Intravenous Given 07/26/23 2204)  dexamethasone (DECADRON) injection 10 mg (10 mg Intravenous Given 07/26/23 2205)  lactated ringers bolus 1,000 mL (1,000 mLs Intravenous New Bag/Given 07/26/23 2214)   Medical Decision Making:   Paula Stout is a 67 y.o. female who presented to  the ED today with headache detailed above.    Patient placed on continuous vitals and telemetry monitoring while in ED which was reviewed periodically.   Complete initial physical exam performed, notably the patient  was HDS in NAD.    Reviewed and confirmed nursing documentation for past medical history, family history, social history.    Initial Assessment:   With the patient's presentation of headache, most likely diagnosis is tension type headaches vs atypical migraines. Other diagnoses were considered including (but not limited to) intracranial mass, intracranial  hemorrhage, intracranial infection including meningitis vs encephalitis, GCA, trigeminal neuralgia. These are considered less likely due to history of present illness and physical exam findings.    This is most consistent with an acute life/limb threatening illness complicated by underlying chronic conditions.   Timeline and slow onset is not consistent with SAH/ICH   Age and description of pain is not consistent with GCA   Lack of fever,meningismus is not consistent with meningitis/encephalitis   Initial Plan:  Will initiate treatment with Compazine/Benardryll/NSAIDS/Tylenol for treatment of nonspecific headache  Screening labs including CBC and Metabolic panel to evaluate for infectious or metabolic etiology of disease.  Urinalysis with reflex culture ordered to evaluate for UTI or relevant urologic/nephrologic pathology.  Offerred CTH to evaluate for structural IC etiology, however with multiple similar studies and formal diagnosis of migraine, patient has elected to hold off on the study at this time Objective evaluation as below reviewed   Initial Study Results:   Laboratory  All laboratory results reviewed without evidence of clinically relevant pathology.    EKG EKG was reviewed independently. Rate, rhythm, axis, intervals all examined and without medically relevant abnormality. ST segments without concerns for elevations.    Radiology:  All images reviewed independently. Agree with radiology report at this time.   DG Chest 2 View    (Results Pending)    Final Assessment and Plan:   On reassessment patient is in no acute distress. Ambulatory tolerating p.o. intake.  Headache seems grossly improved.  Chest x-ray delayed by radiology reports but with negative my wet read will follow-up final report asynchronously.  Patient's chart review is concerning: First, it appears that her headache condition was well-controlled for years.  She came in for a breakthrough headache for the first  time in over a year.  At that time she received IV hydromorphone and since that visit she has been back every 5 to 15 days for repeat medications requesting IV hydromorphone.  While this does not demonstrate any definitive drug-seeking behavior, it could raise concern for pattern in the future.  Alternatively this may just be causing rebound headaches that cause repeat presentations every few days intermittently. Regardless, her headache improved without IV hydromorphone today and this does not seem to be a compulsory portion of her migraine therapy though she initially stated it was.  Disposition:  I have considered need for hospitalization, however, considering all of the above, I believe this patient is stable for discharge at this time.  Patient/family educated about specific return precautions for given chief complaint and symptoms.  Patient/family educated about follow-up with PCP.     Patient/family expressed understanding of return precautions and need for follow-up. Patient spoken to regarding all imaging and laboratory results and appropriate follow up for these results. All education provided in verbal form with additional information in written form. Time was allowed for answering of patient questions. Patient discharged.    Emergency Department Medication Summary:   Medications  prochlorperazine (  COMPAZINE) injection 10 mg (10 mg Intravenous Given 07/26/23 2204)  diphenhydrAMINE (BENADRYL) injection 50 mg (50 mg Intravenous Given 07/26/23 2204)  dexamethasone (DECADRON) injection 10 mg (10 mg Intravenous Given 07/26/23 2205)  lactated ringers bolus 1,000 mL (1,000 mLs Intravenous New Bag/Given 07/26/23 2214)          Clinical Impression:  1. Chronic nonintractable headache, unspecified headache type      Discharge   Final Clinical Impression(s) / ED Diagnoses Final diagnoses:  Chronic nonintractable headache, unspecified headache type    Rx / DC Orders ED Discharge  Orders     None         Glyn Ade, MD 07/26/23 2246

## 2023-07-27 ENCOUNTER — Ambulatory Visit: Payer: Medicare (Managed Care)

## 2023-07-27 ENCOUNTER — Ambulatory Visit (HOSPITAL_BASED_OUTPATIENT_CLINIC_OR_DEPARTMENT_OTHER)
Admission: RE | Admit: 2023-07-27 | Discharge: 2023-07-27 | Disposition: A | Discharge status: Home / Self Care | Payer: Medicare (Managed Care) | Source: Ambulatory Visit | Attending: Emergency Medicine | Admitting: Emergency Medicine

## 2023-07-27 DIAGNOSIS — R918 Other nonspecific abnormal finding of lung field: Secondary | ICD-10-CM | POA: Diagnosis not present

## 2023-07-27 DIAGNOSIS — R911 Solitary pulmonary nodule: Secondary | ICD-10-CM | POA: Diagnosis not present

## 2023-07-27 DIAGNOSIS — I728 Aneurysm of other specified arteries: Secondary | ICD-10-CM | POA: Diagnosis not present

## 2023-07-27 DIAGNOSIS — I700 Atherosclerosis of aorta: Secondary | ICD-10-CM | POA: Diagnosis not present

## 2023-07-27 NOTE — Progress Notes (Unsigned)
NEUROLOGY FOLLOW UP OFFICE NOTE  Paula Stout [id number]  Assessment/Plan:   Migraine with and without aura, without status migrainosus, not intractable, significantly improved (over 50% reduction) on Botox   Migraine prevention: Botox *** Migraine rescue:  Zavzpret NS *** Limit use of pain relievers to no more than 2 days out of week to prevent risk of rebound or  medication-overuse headache. Caffeine cessation/increase water intake/exercise Keep headache diary ***        Subjective:  Paula Stout is a 67 year old left-handed female with paroxysmal atrial fibrillation, PSVT s/p ablation (now with bradycardia and hypotension due to medications), DM II, depression, anxiety who follows up for migraines.  UPDATE: For the past couple of rounds, it seems that the Botox will start wearing off the last 2-3 weeks prior to next injection.  She was seen in the ED on September where she had a CT head personally reviewed that revealed no acute changes.  She was advised to take Nurtec once daily during that period of time.  ***.  The migraines are exacerbating her chronic chest pain as well.  She was seen in the ED on subsequent occasions in October and twice this month, last 2 days ago.  *** Intensity:  moderate Duration:  *** Frequency:  ***   Rescue protocol: Zavzpret NS (has not taken) Current NSAIDS/analgesics:  none Current triptans:  none Current ergotamine:  none Current anti-emetic:  Zofran ODT 4mg  Current muscle relaxants:  none Current Antihypertensive medications:  atenolol, lisinopril, Imdur Current Antidepressant medications:  bupropion XL 300mg  Current Anticonvulsant medications:  topiramate 150mg  daily Current anti-CGRP:  none Current Vitamins/Herbal/Supplements:  D Current Antihistamines/Decongestants:  Benadryl, Xyzal Other therapy:  none Hormone/birth control:  none Other medications:  lurasidone, quetiapine 300mg  QHS, Eliquis     Caffeine:  1 Venti size  cup of coffee daily.  No soda Diet:  four 8 oz glasses of water daily.  No soda.  Skips meals (lunch) Exercise:  no Depression:  stable; Anxiety:  stable Other pain:  no Sleep hygiene:  Sleeps well.  Takes Seroquel.  Daytime somnolence.  Tested negative for sleep apnea.  HISTORY: Onset:  about 1978 Location:  primarily unilateral (either side) posterior and into neck Quality:  stabbing Intensity:  Severe Aura:  phantosmia (usually smells burnt toast or something else cooking) Prodrome:  absent Postdrome:  Associated symptoms:  nausea, vomiting, photophobia, phonophobia, osmophobia, blurred.  She denies associated unilateral numbness or weakness. Duration:  several hours to 2 days Frequency:  1 to 2 a week. (Has 15 headache days a month) Frequency of abortive medication: Takes Excedrin about 4-5 days a week Triggers:  weather changes, certain smells (perfumes) Relieving factors:  ice pack Activity:  aggravates (cannot function about 2-3 days a month) Normally wakes up with them   Prior imaging: 05/19/2023 CT HEAD WO:  1. Generalized cerebral atrophy with chronic white matter small vessel ischemic changes.  2.  No acute intracranial abnormality. 10/15/2021 CT HEAD WO:  No acute intracranial abnormality. 12/02/2020 MRI BRAIN W WO:  This is a normal age-appropriate MRI of the brain with and without contrast. 04/22/2020 MRI BRAIN WO:  Normal MRI brain (without). No acute findings.   Past NSAIDS/analgesics:  Fioricet, tramadol, ASA (allergy) Past abortive triptans:  rizatriptan, sumatriptan (adverse reaction), frovatriptan Past abortive ergotamine:  none Past muscle relaxants:  tizanidine Past anti-emetic:  none Past antihypertensive medications:  propranolol, metoprolol, diltiazem Past antidepressant medications:  amitriptyline Past anticonvulsant medications:  Depakote Past anti-CGRP:  Emgality,  Bernita Raisin 100mg  Past vitamins/Herbal/Supplements:  none Past  antihistamines/decongestants:  none Other past therapies:  Botox (only had one round because neurologist left)  Family history of headache:  daughter (migraines)  PAST MEDICAL HISTORY: Past Medical History:  Diagnosis Date   ACS (acute coronary syndrome) (HCC) 07/31/2021   Adhesive capsulitis of left shoulder 12/16/2016   Asthma without status asthmaticus 02/06/2021   At risk for falls 02/04/2019   Atrial fibrillation (HCC) 09/01/2014   Last Assessment & Plan:  Formatting of this note might be different from the original. A:  Chronic.  Sinus rhythm at this time.  States she was told this during admission at [facility] in the past. P:  On baby aspirin at home will continue.  Low dose metoprolol started.   Bipolar 1 disorder, depressed, moderate (HCC) 02/18/2020   Capsulitis of left shoulder 10/20/2021   Chronic anticoagulation 02/20/2020   Chronic atrial fibrillation (HCC) 02/18/2020   Chronic headache 10/11/2013   Chronic interstitial cystitis 12/28/2005   Chronic migraine without aura, intractable, without status migrainosus 06/24/2017   Colon cancer screening 04/15/2016   Last Assessment & Plan:  Formatting of this note might be different from the original. Will schedule for colonoscopy   Depression, recurrent (HCC) 02/18/2020   Diabetes mellitus (HCC)    Dyslipidemia 02/20/2020   GAD (generalized anxiety disorder) 02/18/2020   GERD (gastroesophageal reflux disease) 11/05/2021   History of atrial fibrillation 06/24/2017   History of posttraumatic stress disorder (PTSD) 10/07/2016   Hyperosmolarity due to secondary diabetes mellitus (HCC) 02/06/2021   Impaired mobility and activities of daily living 11/14/2015   Irritable bowel syndrome (IBS)    Junctional tachycardia (HCC) 11/05/2021   Kidney stone 02/06/2021   Migraine without aura, not refractory 02/06/2021   Mixed hyperlipidemia 04/06/2021   Last Assessment & Plan:  Formatting of this note might be different from the original.  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 Controlled   Refractory migraine with aura 02/06/2021   Refractory migraine without aura 02/06/2021   Renal colic on left side 04/22/2017   Stage 3 chronic kidney disease (HCC) 02/20/2020   Unspecified adverse effect of other drug, medicinal and biological substance(995.29) 02/17/2006   Urinary tract obstruction 02/06/2021    MEDICATIONS: Current Outpatient Medications on File Prior to Visit  Medication Sig Dispense Refill   acetaminophen (TYLENOL) 500 MG tablet Take 1,000 mg by mouth every 6 (six) hours as needed for mild pain.     albuterol (VENTOLIN HFA) 108 (90 Base) MCG/ACT inhaler Inhale 2 puffs into the lungs every 4 (four) hours as needed for wheezing. 1 each 2   Ascorbic Acid (VITAMIN C PO) Take 1 tablet by mouth daily.     atorvastatin (LIPITOR) 80 MG tablet TAKE 1 TABLET DAILY 90 tablet 3   blood glucose meter kit and supplies One Touch Ultra  Check 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 One Touch Ultra  Check blood sugars once daily Dx: E11.9) 1 each 0   botulinum toxin Type A (BOTOX) 200 units injection Inject 155 units IM into multiple site in the face,neck and head once every 90 days (Patient  taking differently: Inject 155 Units into the muscle See admin instructions. Inject 155 units IM into multiple site in the face,neck and head once every 90 days) 1 each 4   buPROPion (WELLBUTRIN XL) 300 MG 24 hr tablet Take 1 tablet (300 mg total) by mouth daily. 90 tablet 3   Cholecalciferol (VITAMIN D3) 125 MCG (5000 UT) capsule Take 5,000 Units by mouth  daily.     Cyanocobalamin (VITAMIN B-12) 5000 MCG TBDP Take 5,000 mcg by mouth daily.     dapagliflozin propanediol (FARXIGA) 10 MG TABS tablet Take 1 tablet (10 mg total) by mouth daily. 90 tablet 1   diltiazem (CARDIZEM CD) 120 MG 24 hr capsule Take 1 capsule (120 mg total) by mouth daily. 90 capsule 3   diltiazem (CARDIZEM) 30 MG tablet Take 1 tablet every 4 hours AS NEEDED for AFIB or SVT heart rate >95 as long as top BP >100. (Patient taking differently: Take 30 mg by mouth See admin instructions. Take 1 tablet every 4 hours AS NEEDED for AFIB or SVT heart rate >95 as long as top BP >100.) 30 tablet 1   ELIQUIS 5 MG TABS tablet TAKE 1 TABLET TWICE A DAY 180 tablet 3   EPINEPHrine 0.3 mg/0.3 mL IJ SOAJ injection Inject 0.3 mg into the muscle as needed for anaphylaxis. 1 each 1   escitalopram (LEXAPRO) 10 MG tablet Take 1 tablet (10 mg total) by mouth daily. 30 tablet 1   ezetimibe (ZETIA) 10 MG tablet TAKE 1 TABLET DAILY 90 tablet 3   fenofibrate 160 MG tablet Take 1 tablet (160 mg total) by mouth daily. 90 tablet 3   fluconazole (DIFLUCAN) 150 MG tablet Take 1 tab, repeat in 72 hours if no improvement. (Patient taking differently: Take 150 mg by mouth See admin instructions. Take 1 tab, repeat in 72 hours if no improvement.) 2 tablet 0   fluticasone (FLONASE) 50 MCG/ACT nasal spray Place 2 sprays into both nostrils daily. (Patient taking differently: Place 2 sprays into both nostrils 3 (three) times a week.) 16 g 6   levocetirizine (XYZAL) 5 MG tablet TAKE 1 TABLET EVERY EVENING (Patient taking differently: Take 5 mg by mouth every evening.) 90 tablet 3   LORazepam (ATIVAN) 0.5 MG tablet Take 1 tablet (0.5 mg total) by mouth every 8 (eight) hours. 30 tablet 1   Lurasidone HCl 120 MG TABS Take 1 tablet (120 mg total) by mouth at bedtime. 90 tablet 3   magnesium oxide (MAG-OX) 400 (240 Mg) MG tablet Take 400 mg by mouth daily.     metFORMIN (GLUCOPHAGE-XR) 500 MG 24 hr tablet TAKE 1 TABLET TWICE A  DAY WITH MEALS (DECREASED IN DOSE) (Patient taking differently: Take 500 mg by mouth daily with breakfast.) 180 tablet 3   metoCLOPramide (REGLAN) 10 MG tablet Take 1 tablet (10 mg total) by mouth every 6 (six) hours. 15 tablet 0   metoprolol succinate (TOPROL-XL) 100 MG 24 hr tablet Take 1 tablet (100 mg total) by mouth 2 (two) times daily. Take with or immediately following a meal. 180 tablet 3   metoprolol tartrate (LOPRESSOR) 25 MG tablet Take 1 tablet (25 mg total) by mouth every 4 (four) hours as needed (PALPITATIONS). 90 tablet 3   MOUNJARO 12.5 MG/0.5ML Pen INJECT 12.5 MG UNDER THE SKIN WEEKLY 2 mL 12   Multiple Vitamins-Minerals (MULTIVITAMIN WITH MINERALS) tablet Take 1 tablet by mouth daily.     nitroGLYCERIN (NITROSTAT) 0.4 MG SL tablet Place 1 tablet (0.4 mg  total) under the tongue every 5 (five) minutes as needed for chest pain. 25 tablet 11   ondansetron (ZOFRAN-ODT) 4 MG disintegrating tablet Take 1 tablet (4 mg total) by mouth every 8 (eight) hours as needed for nausea or vomiting. 20 tablet 0   ONETOUCH VERIO test strip USE DAILY TO CHECK BLOOD SUGAR (Patient taking differently: 1 each by Other route as needed for other (see below). Use daily to check blood sugar.) 100 strip 3   pantoprazole (PROTONIX) 40 MG tablet Take 1 tablet (40 mg total) by mouth daily. 90 tablet 3   QUEtiapine (SEROQUEL) 300 MG tablet Take 1 tablet (300 mg total) by mouth at bedtime. 90 tablet 3   Rimegepant Sulfate (NURTEC) 75 MG TBDP Take 1 tablet (75 mg total) by mouth as needed (take 1 tab at the earlist onset of a migraine. Max 1 tab in 24 hours). 10 tablet 5   topiramate (TOPAMAX) 50 MG tablet TAKE 3 TABLETS TWICE A DAY (Patient taking differently: Take 150 mg by mouth 2 (two) times daily.) 180 tablet 11   No current facility-administered medications on file prior to visit.    ALLERGIES: Allergies  Allergen Reactions   Bee Venom Anaphylaxis   Onion Other (See Comments)    Respiratory Distress    Tetanus-Diphtheria Toxoids Td Anaphylaxis   Sumatriptan     Passed out, nose bleed    Zolpidem Other (See Comments)    Causes sleep walking   Asa [Aspirin] Nausea And Vomiting   Iodine Rash   Nsaids Nausea Only    Rash, hives and trouble breathing   Sulfa Antibiotics Rash   Vancomycin Rash    Other Reaction(s): STEVENS-JOHNSON    FAMILY HISTORY: Family History  Adopted: Yes  Problem Relation Age of Onset   Migraines Daughter    Bipolar disorder Daughter    Migraines Daughter    Migraines Son    Bipolar disorder Son       Objective:  *** General: No acute distress.  Patient appears ***-groomed.   Head:  Normocephalic/atraumatic Eyes:  Fundi examined but not visualized Neck: supple, no paraspinal tenderness, full range of motion Heart:  Regular rate and rhythm Lungs:  Clear to auscultation bilaterally Back: No paraspinal tenderness Neurological Exam: alert and oriented.  Speech fluent and not dysarthric, language intact.  CN II-XII intact. Bulk and tone normal, muscle strength 5/5 throughout.  Sensation to light touch intact.  Deep tendon reflexes 2+ throughout, toes downgoing.  Finger to nose testing intact.  Gait normal, Romberg negative.   Shon Millet, DO  CC: ***

## 2023-07-28 ENCOUNTER — Ambulatory Visit: Payer: Medicare (Managed Care) | Admitting: Neurology

## 2023-07-28 ENCOUNTER — Encounter: Payer: Self-pay | Admitting: Behavioral Health

## 2023-07-28 ENCOUNTER — Telehealth: Payer: Self-pay

## 2023-07-28 ENCOUNTER — Ambulatory Visit (HOSPITAL_COMMUNITY)
Admission: RE | Admit: 2023-07-28 | Discharge: 2023-07-28 | Disposition: A | Discharge status: Home / Self Care | Payer: Medicare (Managed Care) | Source: Ambulatory Visit | Attending: Neurology | Admitting: Neurology

## 2023-07-28 ENCOUNTER — Encounter: Payer: Self-pay | Admitting: Neurology

## 2023-07-28 ENCOUNTER — Ambulatory Visit: Payer: Medicare (Managed Care) | Admitting: Behavioral Health

## 2023-07-28 VITALS — BP 107/73 | HR 98 | Ht 66.0 in | Wt 199.0 lb

## 2023-07-28 DIAGNOSIS — F411 Generalized anxiety disorder: Secondary | ICD-10-CM | POA: Diagnosis not present

## 2023-07-28 DIAGNOSIS — G43E11 Chronic migraine with aura, intractable, with status migrainosus: Secondary | ICD-10-CM | POA: Diagnosis not present

## 2023-07-28 DIAGNOSIS — R519 Headache, unspecified: Secondary | ICD-10-CM

## 2023-07-28 DIAGNOSIS — F331 Major depressive disorder, recurrent, moderate: Secondary | ICD-10-CM

## 2023-07-28 MED ORDER — AJOVY 225 MG/1.5ML ~~LOC~~ SOAJ: 225.0000 mg | SUBCUTANEOUS | 11 refills | Status: DC

## 2023-07-28 MED ORDER — GADOBUTROL 1 MMOL/ML IV SOLN
9.0000 mL | Freq: Once | INTRAVENOUS | Status: AC | PRN
  Administered 2023-07-28: 9 mL via INTRAVENOUS

## 2023-07-28 NOTE — Progress Notes (Addendum)
[facility] Counselor/Therapist Progress Note  Patient ID: Paula Stout, MRN: [id number],    Date: 07/28/2023  Time Spent: 50 minutes, 3:01 PM  to 3:51 spent with the patient.This session was held via video teletherapy. The patient consented to the video teletherapy and was located in her home during this session. She is aware it is the responsibility of the patient to secure confidentiality on her end of the session. The provider was in a private home  for the duration of this session.  The session began as a video session the Wi-Fi/Internet was sketchy so we had to finish via phone session.     Treatment Type: Individual Therapy  Reported Symptoms: Anxiety, depression  Mental Status Exam: Appearance:  Casual     Behavior: Appropriate  Motor: Normal  Speech/Language:  Normal Rate  Affect: Appropriate  Mood: normal  Thought process: normal  Thought content:   WNL  Sensory/Perceptual disturbances:   WNL  Orientation: oriented to person, place, time/date, situation, day of week, month of year, and year  Attention: Good  Concentration: Good  Memory: WNL  Fund of knowledge:  Good  Insight:   Good  Judgment:  Good  Impulse Control: Good   Risk Assessment: Danger to Self:  No Self-injurious Behavior: No Danger to Others: No Duty to Warn:no Physical Aggression / Violence:No  Access to Firearms a concern: No  Gang Involvement:No   Subjective: The patient had an MRI on her lungs today and has a meeting in December to get the results of that.  She is very thankful that the scans of her thyroid came out negative and is optimistic for the same.  She also in meeting with the doctor today discussed her frequency and intensity increase of her migraines.  He scheduled an MRI which they were able to do at 630 tonight.  Her only anxiety is driving to [place] but has confidence in her doctor and is appreciative of getting in so soon.  Home environment has changed to as  her daughter has now been diagnosed with POTS and so she is not doing much of anything.  Her son-in-law does not do anything.  The patient said she bought food last time so she is cooking as much as she can but since being switched from [facility] to [facility] her appetite has gone down and she has lost 20 pounds in the last 2 months.  She is still eating consistently as she can and staying hydrated.  One of the side effects of the Lexapro was dry mouth so she is drinking a lot of water. They also are changing her cardiac medication and tried 2 different doses of it but her heart rate is staying up and she has difficulty with breathing as she goes up and down the stairs.  She is starting a third dose today hoping that it is the right combination.  Palpitations do not appear to be keeping her from sleeping as much but she is having difficulty getting to sleep usually around 2 AM and is sleeping until late morning.  She also will be leaving on December 16 to go to her daughter's house in [place] where she will stay until February 5.  She is optimistic that they can have more positive communication about her moving out there permanently.  She recognizes that the environment she is in now is not healthy for her physically mentally or emotionally. She does contract for safety having no thoughts of hurting herself or anyone else.  Diagnosis: Generalized anxiety disorder, major depressive disorder, moderate, recurrent Plan: I will meet with the patient weekly via care agility.  Treatment plan: We will use cognitive behavioral therapy as well as person centered and supportive therapy in addition to elements of dialectical behavior therapy to help reduce the patient's anxiety and depression by at least 50% with a target date of October 06, 2023.  Goals for improving depression may include having less sadness as indicated by patient report and scores on the PHQ-9, have improved mood and return to a healthier level of  functioning, identify causes including environment for depressed mood and learn ways to cope with depression especially those connected to her medical issues.  Interventions include using cognitive behavioral therapy to explore and replace thoughts and behaviors.  We will look at how depression is experienced in day-to-day living and encouraged sharing of feelings.  We will encourage the use of coping skills for management of depressive symptoms.  Goals for reducing anxiety are to improve her ability to manage anxiety symptoms, better handle stress, identify causes for anxiety and explore ways to lower it, resolve the core conflicts contributing to anxiety as well as manage thoughts and worrisome thinking contributing to feelings of anxiety.  Interventions will include providing education about anxiety, facilitate problem solution skills to help her identify options for resolving stress, teach coping skills for managing anxiety as well as mindfulness and communication in terms of family to reduce her stress level, use cognitive behavior therapy to identify and change anxiety provoking thought and behavior patterns as well as teach distress tolerance and mindfulness skills for alleviating anxiety. Progress: 30%  [name], [facility]                  [name], [facility]               [name], [facility]               [name], [facility]               [name], [facility]               [name], [facility]               [name], [facility]               [name], [facility]               [name], [facility]               [name], [facility]         [facility] Counselor/Therapist Progress Note  Patient ID: Paula Stout, MRN: [id number],    Date: 07/28/2023  Time Spent: 58 minutes, 1:00 PM  to 1:58 spent with the patient.This session  was held via video teletherapy. The patient consented to the video teletherapy and was located in her home during this session. She is aware it is the responsibility of the patient to secure confidentiality on her end of the session. The provider was in a private home  for the duration of this session.      Treatment Type: Individual Therapy  Reported Symptoms: Anxiety, depression  Mental Status Exam: Appearance:  Casual     Behavior: Appropriate  Motor: Normal  Speech/Language:  Normal Rate  Affect: Appropriate  Mood: normal  Thought process: normal  Thought content:   WNL  Sensory/Perceptual disturbances:  WNL  Orientation: oriented to person, place, time/date, situation, day of week, month of year, and year  Attention: Good  Concentration: Good  Memory: WNL  Fund of knowledge:  Good  Insight:   Good  Judgment:  Good  Impulse Control: Good   Risk Assessment: Danger to Self:  No Self-injurious Behavior: No Danger to Others: No Duty to Warn:no Physical Aggression / Violence:No  Access to Firearms a concern: No  Gang Involvement:No   Subjective: Since the last session the patient has had to go to the hospital for a migraine.  The medication that she has at home is not working as effectively now and she is seeing an increase in frequency and intensity of her migraines.  Every time she goes to the hospital for treatment and cost to $120 which she cannot afford to continue to do.  She is going to speak to her doctor about that and the next visit with him in a few weeks.  She has not been sleeping well and thinks that may be a part of why there is an increase in anxiety as well as the migraine headaches.  She says she tries to sleep some during the day but cannot fall asleep.  We talked about looking at a different time of the day to nap instead of around the middle of the day.  She is having difficulty focusing which she also attributes to anxiety and not sleeping well.  She is  working on a project for her son which should have been done earlier in the week but because of focus difficulty she hopes to finish today.  She did start her new mood stabilization medications last week but has not noticed any benefit yet understanding that it usually takes about 2 weeks for that.  There are complications in the home because that her daughter has some health issues so her daughter is not able to help her very much.  Things set as simple as changing her sheets her daughter is not helping her with which is frustrating for the patient.  Her daughter in [place] did confirm that they have tickets for her to go out there on December 17 coming home on January 5.  Her daughter who is in the healthcare field called to ask what was going on and she told her.  I encouraged her to think about a more permanent move out that way as soon as she gets some answers from tasks.  She did have a thyroid biopsy last week and will hear from that within 2 weeks.  They are going to take another CT scan of her lung as the doctor was not satisfied with the last 1 and see what they can see there.  There is anxiety with awaiting results but we talked about compartmentalization using coping skills and staying as busy as possible.  She still does not have much energy and goes downstairs just enough to let the dog out or get something to eat or drink and then come back up.   Plan: I will meet with the patient weekly via care agility.  Treatment plan: We will use cognitive behavioral therapy as well as person centered and supportive therapy in addition to elements of dialectical behavior therapy to help reduce the patient's anxiety and depression by at least 50% with a target date of October 06, 2023.  Goals for improving depression may include having less sadness as indicated by patient report and scores on the PHQ-9, have improved  mood and return to a healthier level of functioning, identify causes including  environment for depressed mood and learn ways to cope with depression especially those connected to her medical issues.  Interventions include using cognitive behavioral therapy to explore and replace thoughts and behaviors.  We will look at how depression is experienced in day-to-day living and encouraged sharing of feelings.  We will encourage the use of coping skills for management of depressive symptoms.  Goals for reducing anxiety are to improve her ability to manage anxiety symptoms, better handle stress, identify causes for anxiety and explore ways to lower it, resolve the core conflicts contributing to anxiety as well as manage thoughts and worrisome thinking contributing to feelings of anxiety.  Interventions will include providing education about anxiety, facilitate problem solution skills to help her identify options for resolving stress, teach coping skills for managing anxiety as well as mindfulness and communication in terms of family to reduce her stress level, use cognitive behavior therapy to identify and change anxiety provoking thought and behavior patterns as well as teach distress tolerance and mindfulness skills for alleviating anxiety. Progress: 30%  [name], [facility]                  [name], [facility]               [name], [facility]               [name], [facility]               [name], [facility]               [name], [facility]               [name], [facility]               [name], [facility]               [name], [facility]               [name], [facility]               [name], [facility]                      [facility] Counselor/Therapist Progress Note  Patient ID: Paula Stout, MRN: [id number],    Date: 07/28/2023  Time Spent: 58 minutes, 2:00 PM  to 2:58  spent with the patient.This session was held via video teletherapy. The patient consented to the video teletherapy and was located in her home during this session. She is aware it is the responsibility of the patient to secure confidentiality on her end of the session. The provider was in a private home  for the duration of this session.  The session began as a video session the Wi-Fi/Internet was sketchy so we had to finish via phone session.     Treatment Type: Individual Therapy  Reported Symptoms: Anxiety, depression  Mental Status Exam: Appearance:  Casual     Behavior: Appropriate  Motor: Normal  Speech/Language:  Normal Rate  Affect: Appropriate  Mood: normal  Thought process: normal  Thought content:   WNL  Sensory/Perceptual disturbances:   WNL  Orientation: oriented to person, place, time/date, situation, day of week, month of year, and year  Attention: Good  Concentration: Good  Memory: WNL  Fund of knowledge:  Good  Insight:  Good  Judgment:  Good  Impulse Control: Good   Risk Assessment: Danger to Self:  No Self-injurious Behavior: No Danger to Others: No Duty to Warn:no Physical Aggression / Violence:No  Access to Firearms a concern: No  Gang Involvement:No   Subjective: It has been a frustrating week for the patient.  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  Part it was her fault but it could have been called at a couple of different times.  Thankfully could not be fixed but the patient does not like making mistakes.  She is noticing more in the morning after she goes down to let her dog out and goes back in the house that she is having lightheaded and dizzy type spells.  One morning this week she had to sit down on the kitchen floor to avoid passing out and had to sit there for 15 minutes.  Her son-in-law came by and ask while she was sitting on the floor but did not offer help and Going to do whatever he was doing.  Her daughter came in  and saw it and did not offer to help but told her she needed to sit in her recliner.  She is trying to figure out what is causing those dizzy spells and has reached out to the electrical cardiac specialist and is waiting for an answer.  Medication changes that helped to slow down some of the palpitations for which she is thankful.  There is some anticipatory anxiety about the biopsy of her thyroid tomorrow.  She is not as concerned about the procedure as the outcome.  She knows it will be up to 2 weeks before she hears we talked about ways to cope with waiting for that answer.  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  They are going to look at the spot on her lung some time in November.  Her anxiety comes from the fact that her husband had cancer and died of cancer.  We talked about challenging those thoughts and waiting on answers and using coping skills in that way time. The patient did say that she felt some benefit from Lexapro as she is tapering up on that and off Viibryd.  She has been hesitant to take the as needed anxiety medication as her mother had become addicted to it.  Encouraged her to have that conversation with her doctor. Plan: I will meet with the patient weekly via care agility.  Treatment plan: We will use cognitive behavioral therapy as well as person centered and supportive therapy in addition to elements of dialectical behavior therapy to help reduce the patient's anxiety and depression by at least 50% with a target date of October 06, 2023.  Goals for improving depression may include having less sadness as indicated by patient report and scores on the PHQ-9, have improved mood and return to a healthier level of functioning, identify causes including environment for depressed mood and learn ways to cope with depression especially those connected to her medical issues.  Interventions include using cognitive behavioral therapy to explore and replace thoughts and  behaviors.  We will look at how depression is experienced in day-to-day living and encouraged sharing of feelings.  We will encourage the use of coping skills for management of depressive symptoms.  Goals for reducing anxiety are to improve her ability to manage anxiety symptoms, better handle stress, identify causes for anxiety and explore ways to lower it,  resolve the core conflicts contributing to anxiety as well as manage thoughts and worrisome thinking contributing to feelings of anxiety.  Interventions will include providing education about anxiety, facilitate problem solution skills to help her identify options for resolving stress, teach coping skills for managing anxiety as well as mindfulness and communication in terms of family to reduce her stress level, use cognitive behavior therapy to identify and change anxiety provoking thought and behavior patterns as well as teach distress tolerance and mindfulness skills for alleviating anxiety. Progress: 30%  [name], [facility]                  [name], [facility]               [name], [facility]               [name], [facility]               [name], [facility]               [name], [facility]               [name], [facility]               [name], [facility]               [name], [facility]               [name], [facility]         [facility] Counselor/Therapist Progress Note  Patient ID: Paula Stout, MRN: [id number],    Date: 07/28/2023  Time Spent: 58 minutes, 1:00 PM  to 1:58 spent with the patient.This session was held via video teletherapy. The patient consented to the video teletherapy and was located in her home during this session. She is aware it is the responsibility of the patient to secure confidentiality on her end of the session. The provider was in  a private home  for the duration of this session.      Treatment Type: Individual Therapy  Reported Symptoms: Anxiety, depression  Mental Status Exam: Appearance:  Casual     Behavior: Appropriate  Motor: Normal  Speech/Language:  Normal Rate  Affect: Appropriate  Mood: normal  Thought process: normal  Thought content:   WNL  Sensory/Perceptual disturbances:   WNL  Orientation: oriented to person, place, time/date, situation, day of week, month of year, and year  Attention: Good  Concentration: Good  Memory: WNL  Fund of knowledge:  Good  Insight:   Good  Judgment:  Good  Impulse Control: Good   Risk Assessment: Danger to Self:  No Self-injurious Behavior: No Danger to Others: No Duty to Warn:no Physical Aggression / Violence:No  Access to Firearms a concern: No  Gang Involvement:No   Subjective: The patient did meet with her psychiatrist and they are going to start Lexapro and Ativan tomorrow because the Viibryd was not promoting any mood elevation.  She told him and we processed that she had a manic episode a few days ago and spent significant amount of money.  It is not things that she will use for her doll making outfit but not money that she needed to spend.  She did send some of the things back.  We talked about ways that she could reduce the temptation especially in a manic phase such as delaying the Her Phone, How to Distract Herself, Cognitive Reframing  Etc.  She Has Come Out Of the Manic Episode and She and the Doctor Optimistic the New Medication Will Help Stabilize That.  She Is Scheduled for a Thyroid Biopsy on the 31st.  She Is Not Necessarily Anxious about the Procedure but Anxious about What They Might Find.  The Ear Nose and Throat Specialist Could Not Say without Actually Having a Biopsy What That Might Look like.  She Said She Would Hear within 2 Weeks the Results of That Biopsy.  We Talked about Compartmentalization and Not Projecting.  [facility]  Experiencing a Reduced Heart Rate so the Palpitations Although Still Present Are Not As Intense but Still Uncomfortable.  It Is Complicated by the Environment That She Is Living in.  She Is Having to Feed Her Dog Upstairs Because Her Daughter's Dogs Are Taking Her Dog's Dog Food Which Is Specially for Him.  She Is Trying to Get outside As Regularly As She Can to Sit outside with Her Dog since It Is Nice but It Takes a Significant Amount of Energy to Get up and down the Stairs.  Her Daughter Reminded Her As Did I to Practice Her Coping Skills and We Reviewed Those Even When the Anxiety Is Manageable so That They Become a Natural Part of Her Response to Anxiety When She Needs It.  Plan: I will meet with the patient weekly via care agility.  Treatment plan: We will use cognitive behavioral therapy as well as person centered and supportive therapy in addition to elements of dialectical behavior therapy to help reduce the patient's anxiety and depression by at least 50% with a target date of October 06, 2023.  Goals for improving depression may include having less sadness as indicated by patient report and scores on the PHQ-9, have improved mood and return to a healthier level of functioning, identify causes including environment for depressed mood and learn ways to cope with depression especially those connected to her medical issues.  Interventions include using cognitive behavioral therapy to explore and replace thoughts and behaviors.  We will look at how depression is experienced in day-to-day living and encouraged sharing of feelings.  We will encourage the use of coping skills for management of depressive symptoms.  Goals for reducing anxiety are to improve her ability to manage anxiety symptoms, better handle stress, identify causes for anxiety and explore ways to lower it, resolve the core conflicts contributing to anxiety as well as manage thoughts and worrisome thinking contributing to feelings of  anxiety.  Interventions will include providing education about anxiety, facilitate problem solution skills to help her identify options for resolving stress, teach coping skills for managing anxiety as well as mindfulness and communication in terms of family to reduce her stress level, use cognitive behavior therapy to identify and change anxiety provoking thought and behavior patterns as well as teach distress tolerance and mindfulness skills for alleviating anxiety. Progress: 30%  [name], [facility]                  [name], [facility]               [name], [facility]               [name], [facility]               [name], [facility]               [name], [facility]  [name], [facility]               [name], [facility]               [name], [facility]               [name], [facility]               [name], [facility]  [name], [facility]               [name], [facility]

## 2023-07-28 NOTE — Telephone Encounter (Signed)
Botox PA needed. Expired 05/2023

## 2023-07-28 NOTE — Patient Instructions (Signed)
Will get urgent MRI of brain with and without contrast Continue Botox - plan to add Ajovy injection every 28 days. Nurtec daily as needed Follow up 3 months for now.

## 2023-07-29 ENCOUNTER — Ambulatory Visit: Payer: Medicare (Managed Care) | Admitting: Family Medicine

## 2023-08-01 ENCOUNTER — Other Ambulatory Visit: Payer: Self-pay | Admitting: Family Medicine

## 2023-08-01 ENCOUNTER — Other Ambulatory Visit (HOSPITAL_COMMUNITY): Payer: Self-pay

## 2023-08-01 MED ORDER — ONDANSETRON 4 MG PO TBDP
4.0000 mg | ORAL_TABLET | Freq: Three times a day (TID) | ORAL | 0 refills | Status: DC | PRN
  Filled 2023-08-01: qty 20, 7d supply, fill #0

## 2023-08-02 ENCOUNTER — Other Ambulatory Visit: Payer: Self-pay

## 2023-08-08 ENCOUNTER — Ambulatory Visit (HOSPITAL_BASED_OUTPATIENT_CLINIC_OR_DEPARTMENT_OTHER): Payer: Medicare (Managed Care)

## 2023-08-08 ENCOUNTER — Ambulatory Visit (INDEPENDENT_AMBULATORY_CARE_PROVIDER_SITE_OTHER): Payer: Medicare (Managed Care) | Admitting: Pharmacist

## 2023-08-08 DIAGNOSIS — N184 Chronic kidney disease, stage 4 (severe): Secondary | ICD-10-CM

## 2023-08-08 DIAGNOSIS — E1165 Type 2 diabetes mellitus with hyperglycemia: Secondary | ICD-10-CM

## 2023-08-08 DIAGNOSIS — I480 Paroxysmal atrial fibrillation: Secondary | ICD-10-CM

## 2023-08-08 NOTE — Addendum Note (Signed)
Addended by: Henrene Pastor B on: 08/08/2023 01:29 PM   Modules accepted: Orders

## 2023-08-08 NOTE — Progress Notes (Signed)
08/08/2023 - Addendum Removed metformin from med list and contacted patient to let her know that we are stopping metformin.  Also called [facility] to cancel any remaining refills.

## 2023-08-08 NOTE — Progress Notes (Signed)
08/08/2023 Name: Paula Stout MRN: [id number] DOB: 23-Nov-1955  Chief Complaint  Patient presents with   Medication Management   Diabetes    Paula Stout is a 67 y.o. year old female.   They were referred to the pharmacist by their PCP for assistance in managing medication access and complex medication management.   Patient will be traveling soon to spend December and January in [place] states with her daughter and family.  Subjective: Patient has  been approved for Extra Help from Medicare. Medications are now $0, $4 or $11.20.   Patient reports affordability concerns with their medications: No  - improved since she was approved for LIS / Medicare Extra Help Patient reports access/transportation concerns to their pharmacy: No  Patient reports adherence concerns with their medications:  No  -     Type 2 DM:  Current therapy - metformin ER 500mg  - take 1 tablet twice a day (dose was decreased due to decreased in Scr) , Mounjaro 12.5mg  weekly (started 9/24 with 7.5mg  weekly) and Farxiga 10mg  daily (started 02/25/2023)  Previous medications tried: Ozempic - stopped due to nausea. Trulicity - stopped when [facility] started  Home blood glucose: Has started using Smart Log app on phone to record blood glucose 7 day average = 91 14 day average = 95 No 30 day average since she has only been using app for a few weeks  Patient is mention that she has nausea and sometime vomiting but feels that it is mostly related to migraine headaches or eating larger meal. She would like to continue current Mounjajro dose.   Mounjaro:  Starting weight = 214 lbs BMI = 34.62; A1c = 6.5% Current Weight 11/21 = 199 lbs (has lost 15 lbs!)  BMI = 32.12  CKD -  Recent serum creatinine = 2.05 and eGFR = 26 on 07/26/2023.  Patient is seeing Dr Kathrene Bongo with [facility]. Follow up planned fro 10/2023  Lisinopril was stopped 03/23/2023 by Dr Kathrene Bongo.  Dose of metformin was lowered due to  changes in eGFR.    Hypertension / Afib:  Current therapy: diltiazem 120mg  daily and metoprolol succinate 100mg  twice a day Also keeps metoprolol 25mg  and diltiazem 30mg  on hand to use as needed for elevated HR > 95 as long as SBP > 100.   SVT Ablation completed 05/04/2023  Anticoagulation therapy: Eliquis 5mg  twice a day (Age = 67 yo; Scr = 2.28; weight = 90.3 kg)  BP Readings from Last 3 Encounters:  07/28/23 107/73  07/26/23 128/73  07/10/23 119/71   CHA2DS2-VASc Score = 3  The patient's score is based upon: CHF History: 0 HTN History: 0 Diabetes History: 1 Stroke History: 0 Vascular Disease History: 0 Age Score: 1 Gender Score: 1   Objective:  Lab Results  Component Value Date   HGBA1C 6.4 05/31/2023    Lab Results  Component Value Date   CREATININE 2.05 (H) 07/26/2023   BUN 22 07/26/2023   NA 136 07/26/2023   K 3.6 07/26/2023   CL 109 07/26/2023   CO2 16 (L) 07/26/2023    Lab Results  Component Value Date   CHOL 127 05/31/2023   HDL 34.30 (L) 05/31/2023   LDLCALC 58 05/31/2023   TRIG 176.0 (H) 05/31/2023   CHOLHDL 4 05/31/2023    Medications Reviewed Today   Medications were not reviewed in this encounter       Assessment/Plan:  Type 2 DM with CKD: blood glucose and weight have improved since starting Mounjaro - Continue  Mounjaro 12.5mg  weekly. Discussed that Mounjaro and medications like Mounjaro can cause nausea and vomiting. She should make sure to eat small meals and pay attention to feelings of fullness and not eat until she feels stuffed. We can lower dose if she nausea is bothersome.  - Continue Farxiga 10mg  daily.   - Will discussed with Dr Carmelia Roller about possibly stopping metformin since blood glucose is averaging in the 90's and her eGFR has decreased.   Hypertension / Afib:  Continue metoprolol 100mg  twice a day and diltiazem 120mg  daily.  Continue Eliquis 5mg  twice a day  Medication Management / Access:  Approved for LIS/ Extra  Help thru 2024. Recommended patient contact [employer] to make sure she would still get LIS in 2025. If she needs to reapply she can do so on line or she can call me and I will help her apply.     Follow Up Plan: 3 to 4 months.   Henrene Pastor, PharmD Clinical Pharmacist [facility]

## 2023-08-09 ENCOUNTER — Other Ambulatory Visit (HOSPITAL_BASED_OUTPATIENT_CLINIC_OR_DEPARTMENT_OTHER): Payer: Self-pay

## 2023-08-09 ENCOUNTER — Ambulatory Visit (INDEPENDENT_AMBULATORY_CARE_PROVIDER_SITE_OTHER): Payer: Medicare (Managed Care) | Admitting: Family Medicine

## 2023-08-09 ENCOUNTER — Encounter: Payer: Self-pay | Admitting: Family Medicine

## 2023-08-09 ENCOUNTER — Other Ambulatory Visit: Payer: Self-pay | Admitting: Family Medicine

## 2023-08-09 VITALS — BP 112/74 | HR 65 | Temp 98.0°F | Resp 16 | Ht 66.0 in | Wt 198.0 lb

## 2023-08-09 DIAGNOSIS — Z23 Encounter for immunization: Secondary | ICD-10-CM

## 2023-08-09 DIAGNOSIS — Z0000 Encounter for general adult medical examination without abnormal findings: Secondary | ICD-10-CM

## 2023-08-09 MED ORDER — ONDANSETRON 4 MG PO TBDP: 4.0000 mg | ORAL_TABLET | Freq: Three times a day (TID) | ORAL | 0 refills | Status: DC | PRN

## 2023-08-09 MED ORDER — SHINGRIX 50 MCG/0.5ML IM SUSR
0.5000 mL | Freq: Once | INTRAMUSCULAR | 1 refills | Status: AC
  Filled 2023-08-09: qty 0.5, 1d supply, fill #0

## 2023-08-09 NOTE — Addendum Note (Signed)
Addended by: Kathi Ludwig on: 08/09/2023 03:18 PM   Modules accepted: Orders

## 2023-08-09 NOTE — Patient Instructions (Addendum)
The Shingrix vaccine (for shingles) is a 2 shot series spaced 2-6 months apart. It can make people feel low energy, achy and almost like they have the flu for 48 hours after injection. 1/5 people can have nausea and/or vomiting. Please plan accordingly when deciding on when to get this shot. Call your pharmacy to get this. The second shot of the series is less severe regarding the side effects, but it still lasts 48 hours.   Keep the diet clean and stay active.  Aim to do some physical exertion for 150 minutes per week. This is typically divided into 5 days per week, 30 minutes per day. The activity should be enough to get your heart rate up. Anything is better than nothing if you have time constraints.  Keep the diet clean and stay active.

## 2023-08-09 NOTE — Progress Notes (Signed)
Subjective:   Paula Stout is a 67 y.o. female who presents for an Initial Medicare Annual Wellness Visit.  Visit Complete: In person  Patient Medicare AWV questionnaire was completed by the patient on 08/09/23; I have confirmed that all information answered by patient is correct and no changes since this date.  Cardiac Risk Factors include: advanced age (>70men, >42 women);diabetes mellitus;obesity (BMI >30kg/m2);sedentary lifestyle     Objective:    BP 112/74 (BP Location: Left Arm, Patient Position: Sitting, Cuff Size: Normal)   Pulse 65   Temp 98 F (36.7 C) (Oral)   Resp 16   Ht 5\' 6"  (1.676 m)   Wt 198 lb (89.8 kg)   SpO2 98%   BMI 31.96 kg/m       08/09/2023    2:38 PM 07/28/2023   11:27 AM 07/26/2023    7:36 PM 07/10/2023    6:59 PM 06/30/2023    5:04 PM 06/20/2023    7:58 PM 06/01/2023    3:27 PM  Advanced Directives  Does Patient Have a Medical Advance Directive? No No No No No No No  Would patient like information on creating a medical advance directive? No - Patient declined   No - Patient declined No - Patient declined  No - Patient declined    Current Medications (verified) Outpatient Encounter Medications as of 08/09/2023  Medication Sig   acetaminophen (TYLENOL) 500 MG tablet Take 1,000 mg by mouth every 6 (six) hours as needed for mild pain.   albuterol (VENTOLIN HFA) 108 (90 Base) MCG/ACT inhaler Inhale 2 puffs into the lungs every 4 (four) hours as needed for wheezing.   atorvastatin (LIPITOR) 80 MG tablet TAKE 1 TABLET DAILY   blood glucose meter kit and supplies One Touch Ultra  Check 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 One Touch Ultra  Check blood sugars once daily Dx: E11.9)   botulinum toxin Type A (BOTOX) 200 units injection Inject 155 units IM into multiple site in the face,neck and head once every 90 days (Patient taking differently: Inject 155 Units into the muscle See  admin instructions. Inject 155 units IM into multiple site in the face,neck and head once every 90 days)   buPROPion (WELLBUTRIN XL) 300 MG 24 hr tablet Take 1 tablet (300 mg total) by mouth daily.   diltiazem (CARDIZEM CD) 120 MG 24 hr capsule Take 1 capsule (120 mg total) by mouth daily.   diltiazem (CARDIZEM) 30 MG tablet Take 1 tablet every 4 hours AS NEEDED for AFIB or SVT heart rate >95 as long as top BP >100.   ELIQUIS 5 MG TABS tablet TAKE 1 TABLET TWICE A DAY   EPINEPHrine 0.3 mg/0.3 mL IJ SOAJ injection Inject 0.3 mg into the muscle as needed for anaphylaxis.   escitalopram (LEXAPRO) 10 MG tablet Take 1 tablet (10 mg total) by mouth daily.   ezetimibe (ZETIA) 10 MG tablet TAKE 1 TABLET DAILY   FARXIGA 10 MG TABS tablet TAKE 1 TABLET DAILY   fenofibrate 160 MG tablet Take 1 tablet (160 mg total) by mouth daily.   fluticasone (FLONASE) 50 MCG/ACT nasal spray Place 2 sprays into both nostrils daily. (Patient taking differently: Place 2 sprays into both nostrils 3 (three) times a week.)   Fremanezumab-vfrm (AJOVY) 225 MG/1.5ML SOAJ Inject 225 mg into the skin every 28 (twenty-eight) days.   levocetirizine (XYZAL) 5 MG tablet TAKE 1 TABLET EVERY EVENING (Patient taking  differently: Take 5 mg by mouth every evening.)   LORazepam (ATIVAN) 0.5 MG tablet Take 1 tablet (0.5 mg total) by mouth every 8 (eight) hours.   Lurasidone HCl 120 MG TABS Take 1 tablet (120 mg total) by mouth at bedtime.   magnesium oxide (MAG-OX) 400 (240 Mg) MG tablet Take 400 mg by mouth daily.   metoCLOPramide (REGLAN) 10 MG tablet Take 1 tablet (10 mg total) by mouth every 6 (six) hours.   metoprolol succinate (TOPROL-XL) 100 MG 24 hr tablet Take 1 tablet (100 mg total) by mouth 2 (two) times daily. Take with or immediately following a meal.   metoprolol tartrate (LOPRESSOR) 25 MG tablet Take 1 tablet (25 mg total) by mouth every 4 (four) hours as needed (PALPITATIONS).   MOUNJARO 12.5 MG/0.5ML Pen INJECT 12.5 MG UNDER  THE SKIN WEEKLY   Multiple Vitamins-Minerals (MULTIVITAMIN WITH MINERALS) tablet Take 1 tablet by mouth daily. Daily Diabetes Health Pack - MVI + chromium, fish oil + vit D, magnesium, vit C, alpha lipoic acid w/ green tea   nitroGLYCERIN (NITROSTAT) 0.4 MG SL tablet Place 1 tablet (0.4 mg total) under the tongue every 5 (five) minutes as needed for chest pain.   ondansetron (ZOFRAN-ODT) 4 MG disintegrating tablet Take 1 tablet (4 mg total) by mouth every 8 (eight) hours as needed for nausea or vomiting.   ONETOUCH VERIO test strip USE DAILY TO CHECK BLOOD SUGAR   pantoprazole (PROTONIX) 40 MG tablet Take 1 tablet (40 mg total) by mouth daily.   QUEtiapine (SEROQUEL) 300 MG tablet Take 1 tablet (300 mg total) by mouth at bedtime.   Rimegepant Sulfate (NURTEC) 75 MG TBDP Take 1 tablet (75 mg total) by mouth as needed (take 1 tab at the earlist onset of a migraine. Max 1 tab in 24 hours).   topiramate (TOPAMAX) 50 MG tablet TAKE 3 TABLETS TWICE A DAY (Patient taking differently: Take 3 tablets by mouth 2 (two) times daily.)   Allergies (verified) Bee venom, Onion, Tetanus-diphtheria toxoids td, Sumatriptan, Zolpidem, Asa [aspirin], Iodine, Nsaids, Sulfa antibiotics, and Vancomycin   History: Past Medical History:  Diagnosis Date   ACS (acute coronary syndrome) (HCC) 07/31/2021   Adhesive capsulitis of left shoulder 12/16/2016   Asthma without status asthmaticus 02/06/2021   At risk for falls 02/04/2019   Atrial fibrillation (HCC) 09/01/2014   Last Assessment & Plan:  Formatting of this note might be different from the original. A:  Chronic.  Sinus rhythm at this time.  States she was told this during admission at [facility] in the past. P:  On baby aspirin at home will continue.  Low dose metoprolol started.   Bipolar 1 disorder, depressed, moderate (HCC) 02/18/2020   Capsulitis of left shoulder 10/20/2021   Chronic anticoagulation 02/20/2020   Chronic atrial fibrillation (HCC) 02/18/2020    Chronic headache 10/11/2013   Chronic interstitial cystitis 12/28/2005   Chronic migraine without aura, intractable, without status migrainosus 06/24/2017   Colon cancer screening 04/15/2016   Last Assessment & Plan:  Formatting of this note might be different from the original. Will schedule for colonoscopy   Depression, recurrent (HCC) 02/18/2020   Diabetes mellitus (HCC)    Dyslipidemia 02/20/2020   GAD (generalized anxiety disorder) 02/18/2020   GERD (gastroesophageal reflux disease) 11/05/2021   History of atrial fibrillation 06/24/2017   History of posttraumatic stress disorder (PTSD) 10/07/2016   Hyperosmolarity due to secondary diabetes mellitus (HCC) 02/06/2021   Impaired mobility and activities of daily living 11/14/2015  Irritable bowel syndrome (IBS)    Junctional tachycardia (HCC) 11/05/2021   Kidney stone 02/06/2021   Migraine without aura, not refractory 02/06/2021   Mixed hyperlipidemia 04/06/2021   Last Assessment & Plan:  Formatting of this note might be different from the original. 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 Controlled   Refractory migraine with aura 02/06/2021   Refractory migraine without aura 02/06/2021   Renal colic on left side 04/22/2017   Stage 3 chronic kidney disease (HCC) 02/20/2020   Unspecified adverse effect of other drug, medicinal and biological substance(995.29) 02/17/2006   Urinary tract obstruction 02/06/2021   Past Surgical History:  Procedure Laterality Date   ABDOMINAL HYSTERECTOMY     Fibroids   ATRIAL FIBRILLATION ABLATION     ATRIAL FIBRILLATION ABLATION N/A 10/27/2022   Procedure: ATRIAL FIBRILLATION  ABLATION;  Surgeon: Regan Lemming, MD;  Location: [facility] INVASIVE [facility];  Service: Cardiovascular;  Laterality: N/A;   CARDIAC CATHETERIZATION     CARDIOVERSION     CESAREAN SECTION     KNEE SURGERY     LEFT HEART CATH AND CORONARY ANGIOGRAPHY N/A 03/04/2020   Procedure: LEFT HEART CATH AND CORONARY ANGIOGRAPHY;  Surgeon: Lyn Records, MD;  Location: [facility] INV[facility] CV [facility];  Service: Cardiovascular;  Laterality: N/A;   ROTATOR CUFF REPAIR     SVT ABLATION N/A 05/04/2023   Procedure: SVT ABLATION;  Surgeon: Regan Lemming, MD;  Location: [facility];  Service: Cardiovascular;  Laterality: N/A;   Family History  Adopted: Yes  Problem Relation Age of Onset   Migraines Daughter    Bipolar disorder Daughter    Migraines Daughter    Migraines Son    Bipolar disorder Son    Social History   Socioeconomic History   Marital status: Widowed    Spouse name: Not on file   Number of children: 6   Years of education: Not on file   Highest education level: Associate degree: academic program  Occupational History   Occupation: not employed  Tobacco Use   Smoking status: Never   Smokeless tobacco: Never   Tobacco comments:    Never smoke 07/15/22  Vaping Use   Vaping status: Never Used  Substance and Sexual Activity   Alcohol use: Never    Comment: light social   Drug use: Never   Sexual activity: Not Currently  Other Topics Concern   Not on file  Social History Narrative   Lives in [place] with daughter and son-in-law.  Sews clothing in free time as hobby.    Social Determinants of Health   Financial Resource Strain: Low Risk  (08/09/2023)   Overall Financial Resource Strain (CARDIA)    Difficulty of Paying Living Expenses: Not hard at all  Recent Concern: Financial Resource Strain - Medium Risk (07/24/2023)   Overall Financial Resource Strain (CARDIA)    Difficulty of Paying Living Expenses: Somewhat hard  Food Insecurity: No Food Insecurity (08/09/2023)    Hunger Vital Sign    Worried About Running Out of Food in the Last Year: Never true    Ran Out of Food in the Last Year: Never true  Transportation Needs: No Transportation Needs (  08/09/2023)   PRAPARE - Administrator, Civil Service (Medical): No    Lack of Transportation (Non-Medical): No  Physical Activity: Inactive (08/09/2023)   Exercise Vital Sign    Days of Exercise per Week: 0 days    Minutes of Exercise per Session: 0 min  Stress: No Stress Concern Present (08/09/2023)   [employer] of Occupational Health - Occupational Stress Questionnaire    Feeling of Stress : Not at all  Recent Concern: Stress - Stress Concern Present (07/24/2023)   [employer] of Occupational Health - Occupational Stress Questionnaire    Feeling of Stress : To some extent  Social Connections: Moderately Integrated (08/09/2023)   Social Connection and Isolation Panel [NHANES]    Frequency of Communication with Friends and Family: More than three times a week    Frequency of Social Gatherings with Friends and Family: More than three times a week    Attends Religious Services: 1 to 4 times per year    Active Member of [employer] or Organizations: Yes    Attends Banker Meetings: Never    Marital Status: Widowed  Recent Concern: Social Connections - Socially Isolated (07/24/2023)   Social Connection and Isolation Panel [NHANES]    Frequency of Communication with Friends and Family: Three times a week    Frequency of Social Gatherings with Friends and Family: Never    Attends Religious Services: Never    Database administrator or Organizations: No    Attends Engineer, structural: Not on file    Marital Status: Widowed    Tobacco Counseling Counseling given: Not Answered Tobacco comments: Never smoke 07/15/22   Clinical Intake:  Pre-visit preparation completed: No  Pain : No/denies pain Pain Score: 0-No pain     Nutritional Status: BMI > 30   Obese Nutritional Risks: None Diabetes: Yes CBG done?: No Did pt. bring in CBG monitor from home?: No  How often do you need to have someone help you when you read instructions, pamphlets, or other written materials from your doctor or pharmacy?: 1 - Never  Interpreter Needed?: No      Activities of Daily Living    08/09/2023    2:36 PM 08/06/2023   11:46 AM  In your present state of health, do you have any difficulty performing the following activities:  Hearing? 0 0  Vision? 0 0  Difficulty concentrating or making decisions? 0 0  Walking or climbing stairs? 0 0  Dressing or bathing? 0 0  Doing errands, shopping? 0 1  Preparing Food and eating ? N N  Using the Toilet? N N  In the past six months, have you accidently leaked urine? Y Y  Do you have problems with loss of bowel control? N N  Managing your Medications? N N  Managing your Finances? N N  Housekeeping or managing your Housekeeping? N N    Patient Care Team: Sharlene Dory, DO as PCP - General (Family Medicine) Regan Lemming, MD as PCP - Electrophysiology (Cardiology) Georgeanna Lea, MD as PCP - Cardiology (Cardiology)  Indicate any recent Medical Services you may have received from other than [facility] providers in the past year (date may be approximate).     Assessment:   This is a routine wellness examination for Paula Stout.  Hearing/Vision screen Hearing Screening (Inadequate exam)   250Hz  500Hz  1000Hz  2000Hz   Right ear Pass Pass Pass Pass  Left ear Pass Pass Pass Pass   Vision Screening (  Inadequate exam)   Right eye Left eye Both eyes  Without correction     With correction 20/20 20/20 20/20      Goals Addressed   None    Depression Screen    08/09/2023    2:39 PM 08/09/2023    2:27 PM 05/20/2023    2:57 PM 03/04/2023   11:35 AM 02/28/2023    2:20 PM 09/13/2022    2:59 PM 07/21/2022   10:22 AM  PHQ 2/9 Scores  PHQ - 2 Score 6 2 2  3  0 2  PHQ- 9 Score 13 6 4  3  0 3      Information is confidential and restricted. Go to Review Flowsheets to unlock data.    Fall Risk    08/09/2023    2:38 PM 08/09/2023    2:27 PM 08/06/2023   11:46 AM 07/28/2023   11:27 AM 02/28/2023    2:19 PM  Fall Risk   Falls in the past year? 1 1 1  0 1  Comment     due to medication  Number falls in past yr: 1 0 1 0 1  Injury with Fall? 0 0 0 0 1  Risk for fall due to :     No Fall Risks  Follow up  Falls evaluation completed  Falls evaluation completed Falls evaluation completed    MEDICARE RISK AT HOME: Medicare Risk at Home Any stairs in or around the home?: Yes If so, are there any without handrails?: No Home free of loose throw rugs in walkways, pet beds, electrical cords, etc?: Yes Adequate lighting in your home to reduce risk of falls?: Yes Life alert?: No Use of a cane, walker or w/c?: No Grab bars in the bathroom?: No Shower chair or bench in shower?: No Elevated toilet seat or a handicapped toilet?: No  TIMED UP AND GO:  Was the test performed? Yes  Length of time to ambulate 10 feet: 7 sec Gait steady and fast without use of assistive device    Cognitive Function:    08/09/2023    2:41 PM  MMSE - Mini Mental State Exam  Orientation to time 5  Orientation to Place 5  Registration 3  Attention/ Calculation 5  Recall 3  Language- name 2 objects 2  Language- repeat 1  Language- follow 3 step command 3  Language- read & follow direction 1  Write a sentence 1  Copy design 0  Total score 29        08/09/2023    2:45 PM  6CIT Screen  What Year? 0 points  What month? 0 points  What time? 0 points  Count back from 20 0 points  Months in reverse 2 points  Repeat phrase 0 points  Total Score 2 points    Immunizations Immunization History  Administered Date(s) Administered   Influenza, Quadrivalent, Recombinant, Inj, Pf 06/20/2018   Influenza, Seasonal, Injecte, Preservative Fre 08/13/2002, 09/15/2007, 05/23/2008, 07/23/2012   Influenza,inj,Quad  PF,6+ Mos 09/21/2017, 10/13/2020, 07/21/2022   Influenza-Unspecified 09/03/2014, 07/29/2015, 06/14/2016, 06/19/2018, 06/10/2021   Moderna Sars-Covid-2 Vaccination 11/16/2019, 12/14/2019   PFIZER(Purple Top)SARS-COV-2 Vaccination 07/12/2020   PNEUMOCOCCAL CONJUGATE-20 11/22/2022   Pneumococcal Polysaccharide-23 05/23/2008, 01/05/2013, 10/09/2018   Tdap 10/09/2018    TDAP status: Up to date  Flu Vaccine status: Received today.   Covid-19 vaccine status: Declined, Education has been provided regarding the importance of this vaccine but patient still declined. Advised may receive this vaccine at local pharmacy or Health Dept.or vaccine clinic. Aware  to provide a copy of the vaccination record if obtained from local pharmacy or Health Dept. Verbalized acceptance and understanding.  Qualifies for Shingles Vaccine? Yes   Zostavax completed No   Shingrix Completed?: No.    Education has been provided regarding the importance of this vaccine. Patient has been advised to call insurance company to determine out of pocket expense if they have not yet received this vaccine. Advised may also receive vaccine at local pharmacy or Health Dept. Verbalized acceptance and understanding.  Screening Tests Health Maintenance  Topic Date Due   Zoster Vaccines- Shingrix (1 of 2) Never done   INFLUENZA VACCINE  12/05/2023 (Originally 04/07/2023)   HEMOGLOBIN A1C  11/28/2023   Diabetic kidney evaluation - Urine ACR  02/28/2024   FOOT EXAM  02/28/2024   OPHTHALMOLOGY EXAM  03/29/2024   Diabetic kidney evaluation - eGFR measurement  07/25/2024   Medicare Annual Wellness (AWV)  08/08/2024   MAMMOGRAM  02/20/2025   DTaP/Tdap/Td (2 - Td or Tdap) 10/09/2028   Colonoscopy  10/13/2028   Pneumonia Vaccine 22+ Years old  Completed   DEXA SCAN  Completed   Hepatitis C Screening  Completed   HIV Screening  Completed   HPV VACCINES  Aged Out   COVID-19 Vaccine  Discontinued    Health Maintenance  Health Maintenance  Due  Topic Date Due   Zoster Vaccines- Shingrix (1 of 2) Never done    Colorectal cancer screening: Type of screening: Colonoscopy. Completed 10/13/18. Repeat every 10 years  Mammogram status: Completed 02/21/23. Repeat every year  Bone Density status: Completed 02/21/23. Results reflect: Bone density results: NORMAL. Repeat every 5-10 years.  Lung Cancer Screening: (Low Dose CT Chest recommended if Age 60-80 years, 20 pack-year currently smoking OR have quit w/in 15years.) does not qualify.   Lung Cancer Screening Referral: N/A  Additional Screening:  Hepatitis C Screening: does not qualify; Completed   Vision Screening: Recommended annual ophthalmology exams for early detection of glaucoma and other disorders of the eye. Is the patient up to date with their annual eye exam?  Yes  Who is the provider or what is the name of the office in which the patient attends annual eye exams? Dr. Sondra Barges If pt is not established with a provider, would they like to be referred to a provider to establish care?  N/A .   Dental Screening: Recommended annual dental exams for proper oral hygiene  Diabetic Foot Exam: Diabetic Foot Exam: Completed 02/28/23  Community Resource Referral / Chronic Care Management: CRR required this visit?  No   CCM required this visit?  No     Plan:     I have personally reviewed and noted the following in the patient's chart:   Medical and social history Use of alcohol, tobacco or illicit drugs  Current medications and supplements including opioid prescriptions. Patient is not currently taking opioid prescriptions. Functional ability and status Nutritional status Physical activity Advanced directives List of other physicians Hospitalizations, surgeries, and ER visits in previous 12 months Vitals Screenings to include cognitive, depression, and falls Flu shot today.  Referrals and appointments  In addition, I have reviewed and discussed with patient certain  preventive protocols, quality metrics, and best practice recommendations. A written personalized care plan for preventive services as well as general preventive health recommendations were provided to patient.     Jilda Roche [place], DO   08/09/2023   After Visit Summary: (In Person-Printed) AVS printed and given to the patient

## 2023-08-09 NOTE — Progress Notes (Signed)
Counseled on exercise. Moving to [place] soon .

## 2023-08-10 ENCOUNTER — Ambulatory Visit: Payer: Medicare (Managed Care) | Admitting: Behavioral Health

## 2023-08-10 DIAGNOSIS — F331 Major depressive disorder, recurrent, moderate: Secondary | ICD-10-CM

## 2023-08-10 DIAGNOSIS — F411 Generalized anxiety disorder: Secondary | ICD-10-CM | POA: Diagnosis not present

## 2023-08-10 NOTE — Progress Notes (Signed)
08/10/2023 - Addedum  Please note that this was a phone visit. Patient was present in her home and Clinical Pharmacist Practitioner was present in [facility].

## 2023-08-10 NOTE — Progress Notes (Signed)
[facility] Counselor/Therapist Progress Note  Patient ID: Paula Stout, MRN: [id number],    Date: 08/10/2023  Time Spent: 53 minutes, 2 PM until 2:53 PM spent with the patient.This session was held via video teletherapy. The patient consented to the video teletherapy and was located in her home during this session. She is aware it is the responsibility of the patient to secure confidentiality on her end of the session. The provider was in a private home  for the duration of this session.  The session began as a video session the Wi-Fi/Internet was sketchy so we had to finish via phone session.     Treatment Type: Individual Therapy  Reported Symptoms: Anxiety, depression  Mental Status Exam: Appearance:  Casual     Behavior: Appropriate  Motor: Normal  Speech/Language:  Normal Rate  Affect: Appropriate  Mood: normal  Thought process: normal  Thought content:   WNL  Sensory/Perceptual disturbances:   WNL  Orientation: oriented to person, place, time/date, situation, day of week, month of year, and year  Attention: Good  Concentration: Good  Memory: WNL  Fund of knowledge:  Good  Insight:   Good  Judgment:  Good  Impulse Control: Good   Risk Assessment: Danger to Self:  No Self-injurious Behavior: No Danger to Others: No Duty to Warn:no Physical Aggression / Violence:No  Access to Firearms a concern: No  Gang Involvement:No   Subjective: We started the session via video but there were connectivity issues so we switched to phone session.  She acknowledges being depressed especially over the past week and knows a lot of that is related to her environment.  Her daughter was diagnosed with POTS and basically is laying on the couch all the time.  She said no one would believe the environment that she lived in if you did not see it but says it is unkempt and dirty.  There are dogs that are trying and not being cleaned up after.  The patient said she tried to  watch TV downstairs for a few minutes but could not because her daughter's big dogs continue to start her dog up which made it measurable.  She goes downstairs long enough to feed her dog and let her dog out and then comes back to her room and she knows the isolation is not good.  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  We talked about some other things that she could do in her room.  She leaves to go to [place] to visit her other daughter on the 16th of this month and will be gone until February 5 and she is looking forward to that.  The biggest drawback will be having her dog stay here.  They are going to talk about long-term residence for the patient in [place] when she goes to visit in a few weeks.  She knows she cannot continue to live in this environment.  She does have an appointment with the pulmonologist tomorrow to get results of the biopsy and scan.  She called to ask and was told that the doctor would discuss it with her in person.  We attempted to reframe at reminding her that he would have called her if there had been something he was seriously concerned about and I encouraged her to use her coping skills for reducing anxiety.  She  does contract for safety having no thoughts of hurting herself or anyone else. Diagnosis: Generalized anxiety disorder, major depressive disorder, moderate, recurrent Plan: I will meet with the patient weekly via care agility.  Treatment plan: We will use cognitive behavioral therapy as well as person centered and supportive therapy in addition to elements of dialectical behavior therapy to help reduce the patient's anxiety and depression by at least 50% with a target date of October 06, 2023.  Goals for improving depression may include having less sadness as indicated by patient report and scores on the PHQ-9, have  improved mood and return to a healthier level of functioning, identify causes including environment for depressed mood and learn ways to cope with depression especially those connected to her medical issues.  Interventions include using cognitive behavioral therapy to explore and replace thoughts and behaviors.  We will look at how depression is experienced in day-to-day living and encouraged sharing of feelings.  We will encourage the use of coping skills for management of depressive symptoms.  Goals for reducing anxiety are to improve her ability to manage anxiety symptoms, better handle stress, identify causes for anxiety and explore ways to lower it, resolve the core conflicts contributing to anxiety as well as manage thoughts and worrisome thinking contributing to feelings of anxiety.  Interventions will include providing education about anxiety, facilitate problem solution skills to help her identify options for resolving stress, teach coping skills for managing anxiety as well as mindfulness and communication in terms of family to reduce her stress level, use cognitive behavior therapy to identify and change anxiety provoking thought and behavior patterns as well as teach distress tolerance and mindfulness skills for alleviating anxiety. Progress: 30%  [name], [facility]                  [name], [facility]               [name], [facility]               [name], [facility]               [name], [facility]               [name], [facility]               [name], [facility]               [name], [facility]               [name], [facility]               [name], [facility]         [facility] Counselor/Therapist Progress Note  Patient ID: Paula Stout, MRN: [id number],    Date: 08/10/2023  Time Spent: 58 minutes, 1:00  PM  to 1:58 spent with the patient.This session was held via video teletherapy. The patient consented to the video teletherapy and was located in her home during this session. She is aware it is the responsibility of the patient to secure confidentiality on her end of the session. The provider was in a private home  for the duration of this session.      Treatment Type: Individual Therapy  Reported Symptoms: Anxiety, depression  Mental Status Exam: Appearance:  Casual     Behavior: Appropriate  Motor: Normal  Speech/Language:  Normal Rate  Affect: Appropriate  Mood: normal  Thought process: normal  Thought content:   WNL  Sensory/Perceptual disturbances:   WNL  Orientation: oriented to person, place, time/date, situation, day of week, month of year, and year  Attention: Good  Concentration: Good  Memory: WNL  Fund of knowledge:  Good  Insight:   Good  Judgment:  Good  Impulse Control: Good   Risk Assessment: Danger to Self:  No Self-injurious Behavior: No Danger to Others: No Duty to Warn:no Physical Aggression / Violence:No  Access to Firearms a concern: No  Gang Involvement:No   Subjective: Since the last session the patient has had to go to the hospital for a migraine.  The medication that she has at home is not working as effectively now and she is seeing an increase in frequency and intensity of her migraines.  Every time she goes to the hospital for treatment and cost to $120 which she cannot afford to continue to do.  She is going to speak to her doctor about that and the next visit with him in a few weeks.  She has not been sleeping well and thinks that may be a part of why there is an increase in anxiety as well as the migraine headaches.  She says she tries to sleep some during the day but cannot fall asleep.  We talked about looking at a different time of the day to nap instead of around the middle of the day.  She is having difficulty focusing which she also attributes  to anxiety and not sleeping well.  She is working on a project for her son which should have been done earlier in the week but because of focus difficulty she hopes to finish today.  She did start her new mood stabilization medications last week but has not noticed any benefit yet understanding that it usually takes about 2 weeks for that.  There are complications in the home because that her daughter has some health issues so her daughter is not able to help her very much.  Things set as simple as changing her sheets her daughter is not helping her with which is frustrating for the patient.  Her daughter in [place] did confirm that they have tickets for her to go out there on December 17 coming home on January 5.  Her daughter who is in the healthcare field called to ask what was going on and she told her.  I encouraged her to think about a more permanent move out that way as soon as she gets some answers from tasks.  She did have a thyroid biopsy last week and will hear from that within 2 weeks.  They are going to take another CT scan of her lung as the doctor was not satisfied with the last 1 and see what they can see there.  There is anxiety with awaiting results but we talked about compartmentalization using coping skills and staying as busy as possible.  She still does not have much energy and goes downstairs just enough to let the dog out or get something to eat or drink and then come back up.   Plan: I will meet with the patient weekly via care agility.  Treatment plan: We will use cognitive behavioral therapy as well as person centered and supportive therapy in addition to elements of dialectical behavior therapy to help reduce the patient's anxiety and depression by at least 50% with a target date of October 06, 2023.  Goals for improving depression may include having  less sadness as indicated by patient report and scores on the PHQ-9, have improved mood and return to a healthier level of  functioning, identify causes including environment for depressed mood and learn ways to cope with depression especially those connected to her medical issues.  Interventions include using cognitive behavioral therapy to explore and replace thoughts and behaviors.  We will look at how depression is experienced in day-to-day living and encouraged sharing of feelings.  We will encourage the use of coping skills for management of depressive symptoms.  Goals for reducing anxiety are to improve her ability to manage anxiety symptoms, better handle stress, identify causes for anxiety and explore ways to lower it, resolve the core conflicts contributing to anxiety as well as manage thoughts and worrisome thinking contributing to feelings of anxiety.  Interventions will include providing education about anxiety, facilitate problem solution skills to help her identify options for resolving stress, teach coping skills for managing anxiety as well as mindfulness and communication in terms of family to reduce her stress level, use cognitive behavior therapy to identify and change anxiety provoking thought and behavior patterns as well as teach distress tolerance and mindfulness skills for alleviating anxiety. Progress: 30%  [name], [facility]                  [name], [facility]               [name], [facility]               [name], [facility]               [name], [facility]               [name], [facility]               [name], [facility]               [name], [facility]               [name], [facility]               [name], [facility]               [name], [facility]                      [facility] Counselor/Therapist Progress Note  Patient ID: Paula Stout, MRN: [id number],    Date:  08/10/2023  Time Spent: 58 minutes, 2:00 PM  to 2:58 spent with the patient.This session was held via video teletherapy. The patient consented to the video teletherapy and was located in her home during this session. She is aware it is the responsibility of the patient to secure confidentiality on her end of the session. The provider was in a private home  for the duration of this session.  The session began as a video session the Wi-Fi/Internet was sketchy so we had to finish via phone session.     Treatment Type: Individual Therapy  Reported Symptoms: Anxiety, depression  Mental Status Exam: Appearance:  Casual     Behavior: Appropriate  Motor: Normal  Speech/Language:  Normal Rate  Affect: Appropriate  Mood: normal  Thought process: normal  Thought content:   WNL  Sensory/Perceptual disturbances:   WNL  Orientation: oriented to person, place, time/date, situation, day of week, month of year, and year  Attention: Good  Concentration:  Good  Memory: WNL  Fund of knowledge:  Good  Insight:   Good  Judgment:  Good  Impulse Control: Good   Risk Assessment: Danger to Self:  No Self-injurious Behavior: No Danger to Others: No Duty to Warn:no Physical Aggression / Violence:No  Access to Firearms a concern: No  Gang Involvement:No   Subjective: It has been a frustrating week for the patient.  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  Part it was her fault but it could have been called at a couple of different times.  Thankfully could not be fixed but the patient does not like making mistakes.  She is noticing more in the morning after she goes down to let her dog out and goes back in the house that she is having lightheaded and dizzy type spells.  One morning this week she had to sit down on the kitchen floor to avoid passing out and had to sit there for 15 minutes.  Her son-in-law came by and ask while she was sitting on the floor but did not offer help and  Going to do whatever he was doing.  Her daughter came in and saw it and did not offer to help but told her she needed to sit in her recliner.  She is trying to figure out what is causing those dizzy spells and has reached out to the electrical cardiac specialist and is waiting for an answer.  Medication changes that helped to slow down some of the palpitations for which she is thankful.  There is some anticipatory anxiety about the biopsy of her thyroid tomorrow.  She is not as concerned about the procedure as the outcome.  She knows it will be up to 2 weeks before she hears we talked about ways to cope with waiting for that answer.  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  They are going to look at the spot on her lung some time in November.  Her anxiety comes from the fact that her husband had cancer and died of cancer.  We talked about challenging those thoughts and waiting on answers and using coping skills in that way time. The patient did say that she felt some benefit from Lexapro as she is tapering up on that and off Viibryd.  She has been hesitant to take the as needed anxiety medication as her mother had become addicted to it.  Encouraged her to have that conversation with her doctor. Plan: I will meet with the patient weekly via care agility.  Treatment plan: We will use cognitive behavioral therapy as well as person centered and supportive therapy in addition to elements of dialectical behavior therapy to help reduce the patient's anxiety and depression by at least 50% with a target date of October 06, 2023.  Goals for improving depression may include having less sadness as indicated by patient report and scores on the PHQ-9, have improved mood and return to a healthier level of functioning, identify causes including environment for depressed mood and learn ways to cope with depression especially those connected to her medical issues.  Interventions include using cognitive  behavioral therapy to explore and replace thoughts and behaviors.  We will look at how depression is experienced in day-to-day living and encouraged sharing of feelings.  We will encourage the use of coping skills for management of depressive symptoms.  Goals for reducing anxiety are to improve her ability to manage anxiety  symptoms, better handle stress, identify causes for anxiety and explore ways to lower it, resolve the core conflicts contributing to anxiety as well as manage thoughts and worrisome thinking contributing to feelings of anxiety.  Interventions will include providing education about anxiety, facilitate problem solution skills to help her identify options for resolving stress, teach coping skills for managing anxiety as well as mindfulness and communication in terms of family to reduce her stress level, use cognitive behavior therapy to identify and change anxiety provoking thought and behavior patterns as well as teach distress tolerance and mindfulness skills for alleviating anxiety. Progress: 30%  [name], [facility]                  [name], [facility]               [name], [facility]               [name], [facility]               [name], [facility]               [name], [facility]               [name], [facility]               [name], [facility]               [name], [facility]               [name], [facility]         [facility] Counselor/Therapist Progress Note  Patient ID: Paula Stout, MRN: [id number],    Date: 08/10/2023  Time Spent: 58 minutes, 1:00 PM  to 1:58 spent with the patient.This session was held via video teletherapy. The patient consented to the video teletherapy and was located in her home during this session. She is aware it is the responsibility of the patient to secure  confidentiality on her end of the session. The provider was in a private home  for the duration of this session.      Treatment Type: Individual Therapy  Reported Symptoms: Anxiety, depression  Mental Status Exam: Appearance:  Casual     Behavior: Appropriate  Motor: Normal  Speech/Language:  Normal Rate  Affect: Appropriate  Mood: normal  Thought process: normal  Thought content:   WNL  Sensory/Perceptual disturbances:   WNL  Orientation: oriented to person, place, time/date, situation, day of week, month of year, and year  Attention: Good  Concentration: Good  Memory: WNL  Fund of knowledge:  Good  Insight:   Good  Judgment:  Good  Impulse Control: Good   Risk Assessment: Danger to Self:  No Self-injurious Behavior: No Danger to Others: No Duty to Warn:no Physical Aggression / Violence:No  Access to Firearms a concern: No  Gang Involvement:No   Subjective: The patient did meet with her psychiatrist and they are going to start Lexapro and Ativan tomorrow because the Viibryd was not promoting any mood elevation.  She told him and we processed that she had a manic episode a few days ago and spent significant amount of money.  It is not things that she will use for her doll making outfit but not money that she needed to spend.  She did send some of the things back.  We talked about ways that she could reduce the temptation especially in a  manic phase such as delaying the Her Phone, How to Distract Herself, Cognitive Reframing Etc.  She Has Come Out Of the Manic Episode and She and the Doctor Optimistic the New Medication Will Help Stabilize That.  She Is Scheduled for a Thyroid Biopsy on the 31st.  She Is Not Necessarily Anxious about the Procedure but Anxious about What They Might Find.  The Ear Nose and Throat Specialist Could Not Say without Actually Having a Biopsy What That Might Look like.  She Said She Would Hear within 2 Weeks the Results of That Biopsy.  We Talked about  Compartmentalization and Not Projecting.  She Is At [facility] Experiencing a Reduced Heart Rate so the Palpitations Although Still Present Are Not As Intense but Still Uncomfortable.  It Is Complicated by the Environment That She Is Living in.  She Is Having to Feed Her Dog Upstairs Because Her Daughter's Dogs Are Taking Her Dog's Dog Food Which Is Specially for Him.  She Is Trying to Get outside As Regularly As She Can to Sit outside with Her Dog since It Is Nice but It Takes a Significant Amount of Energy to Get up and down the Stairs.  Her Daughter Reminded Her As Did I to Practice Her Coping Skills and We Reviewed Those Even When the Anxiety Is Manageable so That They Become a Natural Part of Her Response to Anxiety When She Needs It.  Plan: I will meet with the patient weekly via care agility.  Treatment plan: We will use cognitive behavioral therapy as well as person centered and supportive therapy in addition to elements of dialectical behavior therapy to help reduce the patient's anxiety and depression by at least 50% with a target date of October 06, 2023.  Goals for improving depression may include having less sadness as indicated by patient report and scores on the PHQ-9, have improved mood and return to a healthier level of functioning, identify causes including environment for depressed mood and learn ways to cope with depression especially those connected to her medical issues.  Interventions include using cognitive behavioral therapy to explore and replace thoughts and behaviors.  We will look at how depression is experienced in day-to-day living and encouraged sharing of feelings.  We will encourage the use of coping skills for management of depressive symptoms.  Goals for reducing anxiety are to improve her ability to manage anxiety symptoms, better handle stress, identify causes for anxiety and explore ways to lower it, resolve the core conflicts contributing to anxiety as well as manage thoughts  and worrisome thinking contributing to feelings of anxiety.  Interventions will include providing education about anxiety, facilitate problem solution skills to help her identify options for resolving stress, teach coping skills for managing anxiety as well as mindfulness and communication in terms of family to reduce her stress level, use cognitive behavior therapy to identify and change anxiety provoking thought and behavior patterns as well as teach distress tolerance and mindfulness skills for alleviating anxiety. Progress: 30%  [name], [facility]                  [name], [facility]               [name], [facility]               [name], [facility]               [name], [facility]  [name], [facility]               [name], [facility]               [name], [facility]               [name], [facility]               [name], [facility]               [name], [facility]  [name], [facility]               [name], [facility]               [name], [facility]

## 2023-08-11 ENCOUNTER — Encounter: Payer: Self-pay | Admitting: Nurse Practitioner

## 2023-08-11 ENCOUNTER — Ambulatory Visit: Payer: Medicare (Managed Care) | Admitting: Nurse Practitioner

## 2023-08-11 VITALS — BP 90/68 | HR 66 | Ht 66.0 in | Wt 197.6 lb

## 2023-08-11 DIAGNOSIS — E041 Nontoxic single thyroid nodule: Secondary | ICD-10-CM | POA: Diagnosis not present

## 2023-08-11 DIAGNOSIS — I288 Other diseases of pulmonary vessels: Secondary | ICD-10-CM

## 2023-08-11 DIAGNOSIS — I482 Chronic atrial fibrillation, unspecified: Secondary | ICD-10-CM | POA: Diagnosis not present

## 2023-08-11 DIAGNOSIS — R0609 Other forms of dyspnea: Secondary | ICD-10-CM

## 2023-08-11 DIAGNOSIS — J4530 Mild persistent asthma, uncomplicated: Secondary | ICD-10-CM

## 2023-08-11 DIAGNOSIS — I728 Aneurysm of other specified arteries: Secondary | ICD-10-CM | POA: Insufficient documentation

## 2023-08-11 DIAGNOSIS — R911 Solitary pulmonary nodule: Secondary | ICD-10-CM

## 2023-08-11 MED ORDER — BUDESONIDE-FORMOTEROL FUMARATE 80-4.5 MCG/ACT IN AERO: 2.0000 | INHALATION_SPRAY | Freq: Two times a day (BID) | RESPIRATORY_TRACT | 3 refills | Status: DC

## 2023-08-11 MED ORDER — LEVALBUTEROL TARTRATE 45 MCG/ACT IN AERO: 2.0000 | INHALATION_SPRAY | Freq: Four times a day (QID) | RESPIRATORY_TRACT | 2 refills | Status: DC | PRN

## 2023-08-11 NOTE — Progress Notes (Signed)
@Patient  ID: Paula Stout, female    DOB: Jan 14, 1956, 67 y.o.   MRN: [id number]  Chief Complaint  Patient presents with   Follow-up    CT Results    Referring provider: Sharlene Dory*  HPI: 67 year old female, never smoker followed for lung nodule and asthma. She is a patient of Dr. Kavin Leech and last seen in office 06/08/2023. Past medical history significant for a fib on Eliquis, migraines, HTN, SVT, GERD, IBS, DM, benign thyroid nodule, CKD, bipolar, HLD, anxiety, PTSD, insomnia.   TEST/EVENTS:  05/20/2023 CT chest: right thyroid nodule into the mediastinum 2.0 x 2.8 cm. Focal nodular density with irregular borders in superior segment of RLL 1.3x 1.6 cm 05/24/2023 thyroid US: multinodular thyroid with solid nodules  06/08/2023 serologies negative 06/23/2023 echo: EF 60 to 65%.  Mild LVH.  RV size and function normal.  No significant valvular disease. 07/27/2023 super D CT chest: Atherosclerosis.  Enlarged pulmonic trunk.  Ascending aorta measures 3.6 cm.  Exophytic thyroid tissue off the isthmus.  Spiculated nodular consolidation in the right lower lobe is less solid in appearance but overall measures the same, 1.5 x 1.7 cm.  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 Delton Coombes. Referred for abnormal CT chest. She had gone to the ED 05/20/2023 with chest discomfort. CT showed a 2.0x2.8 cm thyroid nodule and a focal nodular density with irregular borders in the superior segment in the RLL 1.3 x 1.6 cm. Has asthma - can experience wheeze and SOB w exertion. Uses albuterol once a week. Some daily cough, dry cough. Allergy sx, worst in spring - on flonase and xyzal. Has a FNA planned for thyroid nodules. Advised to use albuterol as needed and assess response. May consider PFTs moving forward.  Pulmonary nodule with unclear etiology. Possible infectious/inflammatory vs malignancy. Unclear if thyroid nodule is connected. Plan to correlate once biopsy complete.  Repeat CT chest in 3 months. Check autoimmune labs.   08/11/2023: Today - follow up Discussed the use of AI scribe software for clinical note transcription with the patient, who gave verbal consent to proceed.  History of Present Illness   The patient, with a history of asthma and a recently identified lung nodule, presents for a follow-up visit after a CT scan.  CT showed an enlarged pulmonic trunk, exophytic thyroid tissue and a spiculated nodular consolidation in the superior segment of right lower lobe.  The nodule actually was less solid-appearing but overall measured the same as prior imaging, 1.5 x 1.7 cm.  Felt as though this was a resolving infectious lesion but malignancy was not unable to be excluded.  Recommendation was to repeat CT chest in 1 to 3 months.  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  She feels like she has been doing okay since her last visit.  Breathing overall has worsened over the last 6 months or so.  Patient reports shortness of breath with heart palpitations, which has been evaluated by cardiology.  She has been diagnosed with A-fib and is on Eliquis.  No excessive bruising or abnormal bleeding. These symptoms are exacerbated by physical activity and sometimes relieved by albuterol, though not consistently. The patient has a history of using Symbicort, a daily inhaler, but does not recall its effectiveness or when she stopped this.  She does not have any cough, wheezing, lower extremity swelling, orthopnea, PND, chest pain, lightheadedness/dizziness.  Denies any hemoptysis, fevers, night sweats, weight loss.  She does have a  slight decrease in her appetite but she is on [facility] which is what she attributes this to.   Her most recent echo did not show any evidence of right heart strain or elevated pulmonary artery pressures. Normal pumping function.   The patient also has a nodule on her thyroid, which has been biopsied and deemed benign.   The patient plans  to travel to [place] for an extended stay, leaving 12/16 and returning 2/5. She would like to hold off on any further testing until she gets back. Does not want to postpone her trip.       Allergies  Allergen Reactions   Bee Venom Anaphylaxis   Onion Other (See Comments)    Respiratory Distress   Tetanus-Diphtheria Toxoids Td Anaphylaxis   Sumatriptan     Passed out, nose bleed    Zolpidem Other (See Comments)    Causes sleep walking   Asa [Aspirin] Nausea And Vomiting   Iodine Rash   Nsaids Nausea Only    Rash, hives and trouble breathing   Sulfa Antibiotics Rash   Vancomycin Rash    Other Reaction(s): STEVENS-JOHNSON    Immunization History  Administered Date(s) Administered   Fluad Trivalent(High Dose 65+) 08/09/2023   Influenza, Quadrivalent, Recombinant, Inj, Pf 06/20/2018   Influenza, Seasonal, Injecte, Preservative Fre 08/13/2002, 09/15/2007, 05/23/2008, 07/23/2012   Influenza,inj,Quad PF,6+ Mos 09/21/2017, 10/13/2020, 07/21/2022   Influenza-Unspecified 09/03/2014, 07/29/2015, 06/14/2016, 06/19/2018, 06/10/2021   Moderna Sars-Covid-2 Vaccination 11/16/2019, 12/14/2019   PFIZER(Purple Top)SARS-COV-2 Vaccination 07/12/2020   PNEUMOCOCCAL CONJUGATE-20 11/22/2022   Pneumococcal Polysaccharide-23 05/23/2008, 01/05/2013, 10/09/2018   Tdap 10/09/2018   Zoster Recombinant(Shingrix) 08/09/2023    Past Medical History:  Diagnosis Date   ACS (acute coronary syndrome) (HCC) 07/31/2021   Adhesive capsulitis of left shoulder 12/16/2016   Asthma without status asthmaticus 02/06/2021   At risk for falls 02/04/2019   Atrial fibrillation (HCC) 09/01/2014   Last Assessment & Plan:  Formatting of this note might be different from the original. A:  Chronic.  Sinus rhythm at this time.  States she was told this during admission at [facility] in the past. P:  On baby aspirin at home will continue.  Low dose metoprolol started.   Bipolar 1 disorder, depressed, moderate (HCC) 02/18/2020    Capsulitis of left shoulder 10/20/2021   Chronic anticoagulation 02/20/2020   Chronic atrial fibrillation (HCC) 02/18/2020   Chronic headache 10/11/2013   Chronic interstitial cystitis 12/28/2005   Chronic migraine without aura, intractable, without status migrainosus 06/24/2017   Colon cancer screening 04/15/2016   Last Assessment & Plan:  Formatting of this note might be different from the original. Will schedule for colonoscopy   Depression, recurrent (HCC) 02/18/2020   Diabetes mellitus (HCC)    Dyslipidemia 02/20/2020   GAD (generalized anxiety disorder) 02/18/2020   GERD (gastroesophageal reflux disease) 11/05/2021   History of atrial fibrillation 06/24/2017   History of posttraumatic stress disorder (PTSD) 10/07/2016   Hyperosmolarity due to secondary diabetes mellitus (HCC) 02/06/2021   Impaired mobility and activities of daily living 11/14/2015   Irritable bowel syndrome (IBS)    Junctional tachycardia (HCC) 11/05/2021   Kidney stone 02/06/2021   Migraine without aura, not refractory 02/06/2021   Mixed hyperlipidemia 04/06/2021   Last Assessment & Plan:  Formatting of this note might be different from the original. Due for labs with PCP in November   Morbid obesity (HCC) 12/16/2016   Paroxysmal supraventricular tachycardia (HCC) 11/05/2021   Pneumonia, organism unspecified(486) 11/21/2002   Polyneuropathy due to  type 2 diabetes mellitus (HCC) 02/06/2021   Primary hypertension 04/06/2021   Last Assessment & Plan:  Formatting of this note might be different from the original. Controlled   Refractory migraine with aura 02/06/2021   Refractory migraine without aura 02/06/2021   Renal colic on left side 04/22/2017   Stage 3 chronic kidney disease (HCC) 02/20/2020   Unspecified adverse effect of other drug, medicinal and biological substance(995.29) 02/17/2006   Urinary tract obstruction 02/06/2021    Tobacco History: Social History   Tobacco Use  Smoking Status Never   Smokeless Tobacco Never  Tobacco Comments   Never smoke 07/15/22   Counseling given: Not Answered Tobacco comments: Never smoke 07/15/22   Outpatient Medications Prior to Visit  Medication Sig Dispense Refill   acetaminophen (TYLENOL) 500 MG tablet Take 1,000 mg by mouth every 6 (six) hours as needed for mild pain.     albuterol (VENTOLIN HFA) 108 (90 Base) MCG/ACT inhaler Inhale 2 puffs into the lungs every 4 (four) hours as needed for wheezing. 1 each 2   atorvastatin (LIPITOR) 80 MG tablet TAKE 1 TABLET DAILY 90 tablet 3   blood glucose meter kit and supplies One Touch Ultra  Check 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 One Touch Ultra  Check blood sugars once daily Dx: E11.9) 1 each 0   botulinum toxin Type A (BOTOX) 200 units injection Inject 155 units IM into multiple site in the face,neck and head once every 90 days (Patient taking differently: Inject 155 Units into the muscle See admin instructions. Inject 155 units IM into multiple site in the face,neck and head once every 90 days) 1 each 4   buPROPion (WELLBUTRIN XL) 300 MG 24 hr tablet Take 1 tablet (300 mg total) by mouth daily. 90 tablet 3   diltiazem (CARDIZEM CD) 120 MG 24 hr capsule Take 1 capsule (120 mg total) by mouth daily. 90 capsule 3   diltiazem (CARDIZEM) 30 MG tablet Take 1 tablet every 4 hours AS NEEDED for AFIB or SVT heart rate >95 as long as top BP >100. 30 tablet 1   ELIQUIS 5 MG TABS tablet TAKE 1 TABLET TWICE A DAY 180 tablet 3   EPINEPHrine 0.3 mg/0.3 mL IJ SOAJ injection Inject 0.3 mg into the muscle as needed for anaphylaxis. 1 each 1   escitalopram (LEXAPRO) 10 MG tablet Take 1 tablet (10 mg total) by mouth daily. 30 tablet 1   ezetimibe (ZETIA) 10 MG tablet TAKE 1 TABLET DAILY 90 tablet 3   FARXIGA 10 MG TABS tablet TAKE 1 TABLET DAILY 90 tablet 3   fenofibrate 160 MG tablet Take 1 tablet (160 mg total) by mouth daily. 90 tablet 3    fluticasone (FLONASE) 50 MCG/ACT nasal spray Place 2 sprays into both nostrils daily. (Patient taking differently: Place 2 sprays into both nostrils 3 (three) times a week.) 16 g 6   Fremanezumab-vfrm (AJOVY) 225 MG/1.5ML SOAJ Inject 225 mg into the skin every 28 (twenty-eight) days. 1.68 mL 11   levocetirizine (XYZAL) 5 MG tablet TAKE 1 TABLET EVERY EVENING (Patient taking differently: Take 5 mg by mouth every evening.) 90 tablet 3   LORazepam (ATIVAN) 0.5 MG tablet Take 1 tablet (0.5 mg total) by mouth every 8 (eight) hours. 30 tablet 1   Lurasidone HCl 120 MG TABS Take 1 tablet (120 mg total) by mouth at bedtime. 90 tablet 3   magnesium oxide (MAG-OX) 400 (240  Mg) MG tablet Take 400 mg by mouth daily.     metoCLOPramide (REGLAN) 10 MG tablet Take 1 tablet (10 mg total) by mouth every 6 (six) hours. 15 tablet 0   metoprolol succinate (TOPROL-XL) 100 MG 24 hr tablet Take 1 tablet (100 mg total) by mouth 2 (two) times daily. Take with or immediately following a meal. 180 tablet 3   metoprolol tartrate (LOPRESSOR) 25 MG tablet Take 1 tablet (25 mg total) by mouth every 4 (four) hours as needed (PALPITATIONS). 90 tablet 3   MOUNJARO 12.5 MG/0.5ML Pen INJECT 12.5 MG UNDER THE SKIN WEEKLY 2 mL 12   Multiple Vitamins-Minerals (MULTIVITAMIN WITH MINERALS) tablet Take 1 tablet by mouth daily. Daily Diabetes Health Pack - MVI + chromium, fish oil + vit D, magnesium, vit C, alpha lipoic acid w/ green tea     nitroGLYCERIN (NITROSTAT) 0.4 MG SL tablet Place 1 tablet (0.4 mg total) under the tongue every 5 (five) minutes as needed for chest pain. 25 tablet 11   ondansetron (ZOFRAN-ODT) 4 MG disintegrating tablet Take 1 tablet (4 mg total) by mouth every 8 (eight) hours as needed for nausea or vomiting. 30 tablet 0   ONETOUCH VERIO test strip USE DAILY TO CHECK BLOOD SUGAR 100 strip 3   pantoprazole (PROTONIX) 40 MG tablet Take 1 tablet (40 mg total) by mouth daily. 90 tablet 3   QUEtiapine (SEROQUEL) 300 MG  tablet Take 1 tablet (300 mg total) by mouth at bedtime. 90 tablet 3   Rimegepant Sulfate (NURTEC) 75 MG TBDP Take 1 tablet (75 mg total) by mouth as needed (take 1 tab at the earlist onset of a migraine. Max 1 tab in 24 hours). 10 tablet 5   topiramate (TOPAMAX) 50 MG tablet TAKE 3 TABLETS TWICE A DAY (Patient taking differently: Take 3 tablets by mouth 2 (two) times daily.) 180 tablet 11   No facility-administered medications prior to visit.     Review of Systems:   Constitutional: No weight loss or gain, night sweats, fevers, chills, fatigue, or lassitude. HEENT: No difficulty swallowing, tooth/dental problems, or sore throat. No sneezing, itching, ear ache, nasal congestion, or post nasal drip. +chronic headaches (followed by neurology) CV:  +palpitations. No chest pain, orthopnea, PND, swelling in lower extremities, anasarca, dizziness, syncope Resp: +shortness of breath with exertion. No excess mucus or change in color of mucus. No productive or non-productive. No hemoptysis. No wheezing.  No chest wall deformity GI:  +stable decrease in appetite (related to medication). No heartburn, indigestion, abdominal pain, nausea, vomiting, diarrhea, change in bowel habits, bloody stools.  GU: No dysuria, change in color of urine, urgency or frequency.  No flank pain, no hematuria  Skin: No rash, lesions, ulcerations MSK:  No joint pain or swelling.  No decreased range of motion.  No back pain. Neuro: No dizziness or lightheadedness.  Psych: No depression or anxiety. Mood stable.     Physical Exam:  BP 90/68 (BP Location: Right Arm, Cuff Size: Normal)   Pulse 66   Ht 5\' 6"  (1.676 m)   Wt 197 lb 9.6 oz (89.6 kg)   SpO2 98%   BMI 31.89 kg/m   GEN: Pleasant, interactive, well-appearing; obese; in no acute distress HEENT:  Normocephalic and atraumatic. PERRLA. Sclera white. Nasal turbinates pink, moist and patent bilaterally. No rhinorrhea present. Oropharynx pink and moist, without exudate  or edema. No lesions, ulcerations, or postnasal drip.  NECK:  Supple w/ fair ROM. No JVD present. Normal carotid impulses  w/o bruits. Thyroid asymmetrical with right nodule palpated. No lymphadenopathy.   CV: RRR, no m/r/g, no peripheral edema. Pulses intact, +2 bilaterally. No cyanosis, pallor or clubbing. PULMONARY:  Unlabored, regular breathing. Clear bilaterally A&P w/o wheezes/rales/rhonchi. No accessory muscle use.  GI: BS present and normoactive. Soft, non-tender to palpation. No organomegaly or masses detected. MSK: No erythema, warmth or tenderness. Cap refil <2 sec all extrem. No deformities or joint swelling noted.  Neuro: A/Ox3. No focal deficits noted.   Skin: Warm, no lesions or rashe Psych: Normal affect and behavior. Judgement and thought content appropriate.     Lab Results:  CBC    Component Value Date/Time   WBC 7.6 07/26/2023 1941   RBC 4.02 07/26/2023 1941   HGB 12.2 07/26/2023 1941   HGB 12.4 04/20/2023 1537   HCT 37.6 07/26/2023 1941   HCT 38.3 04/20/2023 1537   PLT 300 07/26/2023 1941   PLT 324 04/20/2023 1537   MCV 93.5 07/26/2023 1941   MCV 92 04/20/2023 1537   MCH 30.3 07/26/2023 1941   MCHC 32.4 07/26/2023 1941   RDW 15.2 07/26/2023 1941   RDW 13.7 04/20/2023 1537   LYMPHSABS 2.2 07/10/2023 1948   LYMPHSABS 1.9 10/13/2022 1002   MONOABS 0.7 07/10/2023 1948   EOSABS 0.3 07/10/2023 1948   EOSABS 0.3 10/13/2022 1002   BASOSABS 0.1 07/10/2023 1948   BASOSABS 0.1 10/13/2022 1002    BMET    Component Value Date/Time   NA 136 07/26/2023 1941   NA 141 04/20/2023 1541   K 3.6 07/26/2023 1941   CL 109 07/26/2023 1941   CO2 16 (L) 07/26/2023 1941   GLUCOSE 103 (H) 07/26/2023 1941   BUN 22 07/26/2023 1941   BUN 14 04/20/2023 1541   CREATININE 2.05 (H) 07/26/2023 1941   CALCIUM 9.5 07/26/2023 1941   GFRNONAA 26 (L) 07/26/2023 1941   GFRAA 39 (L) 04/22/2020 1726    BNP    Component Value Date/Time   BNP 62.1 05/15/2023 1952      Imaging:  CT Super D Chest Wo Contrast  Result Date: 08/10/2023 CLINICAL DATA:  Lung nodule. EXAM: CT CHEST WITHOUT CONTRAST TECHNIQUE: Multidetector CT imaging of the chest was performed using thin slice collimation for electromagnetic bronchoscopy planning purposes, without intravenous contrast. RADIATION DOSE REDUCTION: This exam was performed according to the departmental dose-optimization program which includes automated exposure control, adjustment of the mA and/or kV according to patient size and/or use of iterative reconstruction technique. COMPARISON:  05/20/2023. FINDINGS: Cardiovascular: Atherosclerotic calcification of the aorta. Enlarged pulmonic trunk. Heart is at the upper limits of normal in size to mildly enlarged. No pericardial effusion. Ascending aorta measures 3.6 cm. Mediastinum/Nodes: Exophytic thyroid tissue off the isthmus. No pathologically enlarged mediastinal or axillary lymph nodes. Hilar regions are difficult to definitively evaluate without IV contrast. Esophagus is grossly unremarkable. Lungs/Pleura: Spiculated nodular consolidation in the superior segment right lower lobe is less solid in appearance internally but overall measures the same, 1.5 x 1.7 cm (302/49). No pleural fluid. Airway is unremarkable. Upper Abdomen: Peripherally calcified 1.5 cm splenic artery aneurysm. Visualized portions of the liver, gallbladder, adrenal glands, kidneys, spleen, pancreas, stomach and bowel are otherwise grossly unremarkable. No upper abdominal adenopathy. Musculoskeletal: Degenerative changes in the spine. IMPRESSION: 1. Spiculated nodular consolidation in the superior segment right lower lobe is slightly less solid in appearance but overall measures the same. A resolving infectious lesion can have this appearance. Malignancy cannot be excluded. Recommend additional follow-up CT chest without contrast  in 1-3 months in further evaluation, as clinically indicated. 2. Exophytic thyroid  tissue off the isthmus, previously evaluated by ultrasound 05/24/2023. At that time, there were nodules recommended for biopsy or 1 year sonographic follow-up. 3. 1.5 cm splenic artery aneurysm. 4.  Aortic atherosclerosis (ICD10-I70.0). 5. Enlarged pulmonic trunk, indicative of pulmonary arterial hypertension. Electronically Signed   By: Leanna Battles M.D.   On: 08/10/2023 14:35   MR BRAIN W WO CONTRAST  Result Date: 07/29/2023 CLINICAL DATA:  Initial evaluation for worsening headaches. EXAM: MRI HEAD WITHOUT AND WITH CONTRAST TECHNIQUE: Multiplanar, multiecho pulse sequences of the brain and surrounding structures were obtained without and with intravenous contrast. CONTRAST:  9mL GADAVIST GADOBUTROL 1 MMOL/ML IV SOLN COMPARISON:  Prior CT from 05/19/2023. FINDINGS: Brain: Cerebral volume within normal limits. No significant cerebral white matter disease for age. No evidence for acute or subacute ischemia. Gray-white matter differentiation maintained. No areas of chronic cortical infarction or other insult. No acute or chronic intracranial blood products. No mass lesion, midline shift or mass effect. No hydrocephalus or extra-axial fluid collection. Pituitary gland and suprasellar region within normal limits. No abnormal enhancement. Vascular: Major intracranial vascular flow voids are well maintained. Skull and upper cervical spine: Craniocervical junction within normal limits. Bone marrow signal intensity normal. No scalp soft tissue abnormality. Sinuses/Orbits: Globes orbital soft tissues are within normal limits. Paranasal sinuses are largely clear. No mastoid effusion. Other: None. IMPRESSION: Normal brain MRI. No acute intracranial abnormality or findings to explain patient's symptoms. Electronically Signed   By: Rise Mu M.D.   On: 07/29/2023 05:05   DG Chest 2 View  Result Date: 07/26/2023 CLINICAL DATA:  Chest pain EXAM: CHEST - 2 VIEW COMPARISON:  06/01/2019 FINDINGS: The heart  size and mediastinal contours are within normal limits. Both lungs are clear. The visualized skeletal structures are unremarkable. IMPRESSION: No active cardiopulmonary disease. Electronically Signed   By: Alcide Clever M.D.   On: 07/26/2023 23:02    Administration History     None           No data to display          No results found for: "NITRICOXIDE"      Assessment & Plan:     Pulmonary Nodule Pulmonary nodule with a solid component that has decreased, but otherwise stable in size measuring 1.5 x 1.7 cm. Possible resolving inflammatory/infectious process. Malignancy cannot be completely excluded. No symptoms such as hemoptysis, unintentional weight loss, night sweats, or fever reported. Discussed next steps including short term follow up CT, PET imaging, and possible biopsy. Shared decision to repeat  CT chest scan in two months, upon her arrival back to [place], to monitor changes. If nodule size remains unchanged or increases, a biopsy may be considered to rule out malignancy. - Repeat CT Super D Chest scan in two months  Asthma Asthma with worsening dyspnea, especially during physical activity. She does have palpitations as well so unable to exclude cardiac component as cause of DOE. Suspect multifactorial. Elevated peripheral eosinophil count (300) on previous testing suggesting possible allergic component. Discussed trial of Symbicort (2 puffs twice a day) and switching PRN SABA from albuterol to levalbuterol to reduce cardiac effects. Informed about potential side effects of ICS/LABA. Advised to monitor for increased palpitations and to reassess in 4-6 weeks for efficacy. Teachback performed. Action plan in place - Prescribe Symbicort (2 puffs twice a day).  - Educate on proper inhaler use and oral hygiene to prevent thrush - Switch  albuterol to levalbuterol for rescue inhaler - 2 puffs q 6 hr PRN SOB/wheezing - Monitor for side effects  - Reassess in 4-6 weeks for efficacy -  Schedule lung function testing at next visit  Pulmonary Artery Enlargement Mild enlargement of the pulmonary artery noted on CT scan. Recent echocardiogram showed no evidence of pulmonary hypertension. No evidence of volume overload. Would hold off on further workup at this time.  - Monitor for worsening DOE or evidence of volume overload - Could consider right heart catheterization in the future if cause of DOE remains unclear  Dyspnea on Exertion Likely multifactorial related to cardiac disease, asthma, deconditioning and obesity.   -See above plan  Atrial fibrillation Chronic A-fib on anticoagulation with Eliquis.  Compliant with therapy.  Regular rhythm and rate controlled today. -Follow-up with cardiology as scheduled -Continue Eliquis as prescribed by cardiology Continue antiarrhythmics as prescribed by cardiology  Splenic Artery Aneurysm Calcified splenic artery aneurysm. Discussed monitoring with periodic CT scans and follow-up with primary care physician (Dr. Carmelia Roller). - Monitor with periodic CT scans - Follow-up with primary care physician (Dr. Carmelia Roller)  Thyroid nodule Right thyroid nodule.  Underwent biopsy with benign findings.  -Monitor with PCP as recommended  Follow-up - Schedule follow-up CT scan in 2 months - Follow up in 10 weeks upon return to [place] to review CT and PFTs      Advised if symptoms do not improve or worsen, to please contact office for sooner follow up or seek emergency care.   I spent 45 minutes of dedicated to the care of this patient on the date of this encounter to include pre-visit review of records, face-to-face time with the patient discussing conditions above, post visit ordering of testing, clinical documentation with the electronic health record, making appropriate referrals as documented, and communicating necessary findings to members of the patients care team.  Noemi Chapel, NP 08/11/2023  Pt aware and understands NP's role.

## 2023-08-11 NOTE — Patient Instructions (Addendum)
Switch albuterol to levalbuterol 2 puffs every 6 hours as needed for shortness of breath or wheezing Trial Symbicort 2 puffs Twice daily. Brush tongue and rinse mouth afterwards Continue flonase nasal spray 2 sprays each nostril daily Continue xyzal 1 tab daily   Repeat CT chest in 2 months. If your nodule has not resolved/continued to improve or increased in size, we will likely get a PET scan and consider possible biopsy   Have a great trip!   Follow up with cardiology as scheduled   Follow up in 10 weeks after PFT and CT chest with Dr. Delton Coombes. If symptoms do not improve or worsen, please contact office for sooner follow up or seek emergency care.

## 2023-08-12 ENCOUNTER — Telehealth: Payer: Self-pay

## 2023-08-12 ENCOUNTER — Other Ambulatory Visit (HOSPITAL_COMMUNITY): Payer: Self-pay

## 2023-08-12 NOTE — Telephone Encounter (Signed)
Pharmacy Patient Advocate Encounter   Received notification from CoverMyMeds that prior authorization for Budesonide-Formoterol Fumarate 80-4.5MCG/ACT aerosol  is required/requested.   Insurance verification completed.   The patient is insured through [employer] .   Per test claim:  Breyna 10.3 grams is preferred by the insurance.  If suggested medication is appropriate, Please send in a new RX and discontinue this one. If not, please advise as to why it's not appropriate so that we may request a Prior Authorization. Please note, some preferred medications may still require a PA

## 2023-08-16 ENCOUNTER — Other Ambulatory Visit: Payer: Self-pay | Admitting: Behavioral Health

## 2023-08-16 DIAGNOSIS — F411 Generalized anxiety disorder: Secondary | ICD-10-CM

## 2023-08-16 DIAGNOSIS — F339 Major depressive disorder, recurrent, unspecified: Secondary | ICD-10-CM

## 2023-08-16 DIAGNOSIS — F3130 Bipolar disorder, current episode depressed, mild or moderate severity, unspecified: Secondary | ICD-10-CM

## 2023-08-17 ENCOUNTER — Ambulatory Visit: Payer: Medicare (Managed Care) | Admitting: Behavioral Health

## 2023-08-17 ENCOUNTER — Encounter: Payer: Self-pay | Admitting: Behavioral Health

## 2023-08-17 DIAGNOSIS — F411 Generalized anxiety disorder: Secondary | ICD-10-CM

## 2023-08-17 DIAGNOSIS — F331 Major depressive disorder, recurrent, moderate: Secondary | ICD-10-CM

## 2023-08-17 NOTE — Progress Notes (Addendum)
[facility] Counselor/Therapist Progress Note  Patient ID: Paula Stout, MRN: [id number],    Date: 08/17/2023  Time Spent: 55 minutes, 2 PM until 2:35 PM spent with the patient via video and 2:35 to 2:55 via phone. The patient consented to the video teletherapy and was located in her home during this session. She is aware it is the responsibility of the patient to secure confidentiality on her end of the session. The provider was in a private home  for the duration of this session.  The session began as a video session the Wi-Fi/Internet was sketchy so we had to finish via phone session.     Treatment Type: Individual Therapy  Reported Symptoms: Anxiety, depression  Mental Status Exam: Appearance:  Casual     Behavior: Appropriate  Motor: Normal  Speech/Language:  Normal Rate  Affect: Appropriate  Mood: normal  Thought process: normal  Thought content:   WNL  Sensory/Perceptual disturbances:   WNL  Orientation: oriented to person, place, time/date, situation, day of week, month of year, and year  Attention: Good  Concentration: Good  Memory: WNL  Fund of knowledge:  Good  Insight:   Good  Judgment:  Good  Impulse Control: Good   Risk Assessment: Danger to Self:  No Self-injurious Behavior: No Danger to Others: No Duty to Warn:no Physical Aggression / Violence:No  Access to Firearms a concern: No  Gang Involvement:No   Subjective: The patient had a panic attack at the nail salon recently.  It was the same place that she had a panic attack previously.  She started feeling anxious when she got to the salon and it escalated.  They were able to complete the manicure but when they started the pedicure it started to become overwhelming.  The shop person got her some water and rubbed her hands which helped some but she was not able to finish.  We looked at what might be triggers and similarities and having a panic attack in the same place.  There did not seem to  be anything about this job for about the right there.  She acknowledges that she was thinking about the first time she had a panic attack at the no shop so that may have played into this 1.  There did not appear to be anything specifically that triggered it.  We did talk about using coping skills and cognitive reframing in situations like that and especially if she goes to the nail salon again.  There is still significant strife in her home.  Her daughter and son-in-law have been arguing more.  Last night the patient had a migraine and went to bed at 7:00.  At 9:00 her daughter texted her that she had not done the dishes knowing that the patient had a migraine.  She texted her that she would get them tomorrow.  She says that her daughter for the most part is not doing anything around the house.  Someone came to give them an estimate on cleaning the house and took 1 look at the house and said they would not do it.  The patient says the house is beyond filthy.  The patient says she goes downstairs only long enough to let her dog out or eat and the rest of the time she is in her bedroom upstairs.  Her son-in-law was even taking to using the bathroom that the patient uses and cleans because the other 2 bathrooms in the house and do not get clean.  The son-in-law is not doing much to help either.  The patient is leaving Monday the 16th to spend through February 5 with her children who live in [place].  She has already spoken to them about making a permanent plan for her to move back out there because she knows the environment that she is in is toxic.  She is hopeful that will take place soon but knows she will have to come back in February before heading back after again to get her dog and her car.  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  If it is grown more at that time they will biopsy.  The  doctor said she could not say whether it was or was not cancerous but did not want to biopsy it yet.  The patient said she is trying hard to focus on the positive so we did talk about challenging those thoughts.  The patient knows the going to [facility] for a couple of months will be very good for her to get out of this environment..  She does contract for safety having no thoughts of hurting herself or anyone else. Diagnosis: Generalized anxiety disorder, major depressive disorder, moderate, recurrent Plan: I will meet with the patient weekly via care agility.  Treatment plan: We will use cognitive behavioral therapy as well as person centered and supportive therapy in addition to elements of dialectical behavior therapy to help reduce the patient's anxiety and depression by at least 50% with a target date of October 06, 2023.  Goals for improving depression may include having less sadness as indicated by patient report and scores on the PHQ-9, have improved mood and return to a healthier level of functioning, identify causes including environment for depressed mood and learn ways to cope with depression especially those connected to her medical issues.  Interventions include using cognitive behavioral therapy to explore and replace thoughts and behaviors.  We will look at how depression is experienced in day-to-day living and encouraged sharing of feelings.  We will encourage the use of coping skills for management of depressive symptoms.  Goals for reducing anxiety are to improve her ability to manage anxiety symptoms, better handle stress, identify causes for anxiety and explore ways to lower it, resolve the core conflicts contributing to anxiety as well as manage thoughts and worrisome thinking contributing to feelings of anxiety.  Interventions will include providing education about anxiety, facilitate problem solution skills to help her identify options for resolving stress, teach coping skills  for managing anxiety as well as mindfulness and communication in terms of family to reduce her stress level, use cognitive behavior therapy to identify and change anxiety provoking thought and behavior patterns as well as teach distress tolerance and mindfulness skills for alleviating anxiety. Progress: 30%  [name], [facility]                  [name], [facility]               [name], [facility]               [name], [facility]               [name], [facility]               [name], [facility]  [name], [facility]               [name], [facility]               [name], [facility]               [name], [facility]         [facility] Counselor/Therapist Progress Note  Patient ID: Paula Stout, MRN: [id number],    Date: 08/17/2023  Time Spent: 58 minutes, 1:00 PM  to 1:58 spent with the patient.This session was held via video teletherapy. The patient consented to the video teletherapy and was located in her home during this session. She is aware it is the responsibility of the patient to secure confidentiality on her end of the session. The provider was in a private home  for the duration of this session.      Treatment Type: Individual Therapy  Reported Symptoms: Anxiety, depression  Mental Status Exam: Appearance:  Casual     Behavior: Appropriate  Motor: Normal  Speech/Language:  Normal Rate  Affect: Appropriate  Mood: normal  Thought process: normal  Thought content:   WNL  Sensory/Perceptual disturbances:   WNL  Orientation: oriented to person, place, time/date, situation, day of week, month of year, and year  Attention: Good  Concentration: Good  Memory: WNL  Fund of knowledge:  Good  Insight:   Good  Judgment:  Good  Impulse Control: Good   Risk Assessment: Danger to Self:   No Self-injurious Behavior: No Danger to Others: No Duty to Warn:no Physical Aggression / Violence:No  Access to Firearms a concern: No  Gang Involvement:No   Subjective: Since the last session the patient has had to go to the hospital for a migraine.  The medication that she has at home is not working as effectively now and she is seeing an increase in frequency and intensity of her migraines.  Every time she goes to the hospital for treatment and cost to $120 which she cannot afford to continue to do.  She is going to speak to her doctor about that and the next visit with him in a few weeks.  She has not been sleeping well and thinks that may be a part of why there is an increase in anxiety as well as the migraine headaches.  She says she tries to sleep some during the day but cannot fall asleep.  We talked about looking at a different time of the day to nap instead of around the middle of the day.  She is having difficulty focusing which she also attributes to anxiety and not sleeping well.  She is working on a project for her son which should have been done earlier in the week but because of focus difficulty she hopes to finish today.  She did start her new mood stabilization medications last week but has not noticed any benefit yet understanding that it usually takes about 2 weeks for that.  There are complications in the home because that her daughter has some health issues so her daughter is not able to help her very much.  Things set as simple as changing her sheets her daughter is not helping her with which is frustrating for the patient.  Her daughter in [place] did confirm that they have tickets for her to go out there on December 17 coming home on January 5.  Her daughter who is in the healthcare field called to  ask what was going on and she told her.  I encouraged her to think about a more permanent move out that way as soon as she gets some answers from tasks.  She did have a thyroid  biopsy last week and will hear from that within 2 weeks.  They are going to take another CT scan of her lung as the doctor was not satisfied with the last 1 and see what they can see there.  There is anxiety with awaiting results but we talked about compartmentalization using coping skills and staying as busy as possible.  She still does not have much energy and goes downstairs just enough to let the dog out or get something to eat or drink and then come back up.   Plan: I will meet with the patient weekly via care agility.  Treatment plan: We will use cognitive behavioral therapy as well as person centered and supportive therapy in addition to elements of dialectical behavior therapy to help reduce the patient's anxiety and depression by at least 50% with a target date of October 06, 2023.  Goals for improving depression may include having less sadness as indicated by patient report and scores on the PHQ-9, have improved mood and return to a healthier level of functioning, identify causes including environment for depressed mood and learn ways to cope with depression especially those connected to her medical issues.  Interventions include using cognitive behavioral therapy to explore and replace thoughts and behaviors.  We will look at how depression is experienced in day-to-day living and encouraged sharing of feelings.  We will encourage the use of coping skills for management of depressive symptoms.  Goals for reducing anxiety are to improve her ability to manage anxiety symptoms, better handle stress, identify causes for anxiety and explore ways to lower it, resolve the core conflicts contributing to anxiety as well as manage thoughts and worrisome thinking contributing to feelings of anxiety.  Interventions will include providing education about anxiety, facilitate problem solution skills to help her identify options for resolving stress, teach coping skills for managing anxiety as well as mindfulness and  communication in terms of family to reduce her stress level, use cognitive behavior therapy to identify and change anxiety provoking thought and behavior patterns as well as teach distress tolerance and mindfulness skills for alleviating anxiety. Progress: 30%  [name], [facility]                  [name], [facility]               [name], [facility]               [name], [facility]               [name], [facility]               [name], [facility]               [name], [facility]               [name], [facility]               [name], [facility]               [name], [facility]               [name], [facility]  [facility] Counselor/Therapist Progress Note  Patient ID: Paula Stout, MRN: [id number],    Date: 08/17/2023  Time Spent: 58 minutes, 2:00 PM  to 2:58 spent with the patient.This session was held via video teletherapy. The patient consented to the video teletherapy and was located in her home during this session. She is aware it is the responsibility of the patient to secure confidentiality on her end of the session. The provider was in a private home  for the duration of this session.  The session began as a video session the Wi-Fi/Internet was sketchy so we had to finish via phone session.     Treatment Type: Individual Therapy  Reported Symptoms: Anxiety, depression  Mental Status Exam: Appearance:  Casual     Behavior: Appropriate  Motor: Normal  Speech/Language:  Normal Rate  Affect: Appropriate  Mood: normal  Thought process: normal  Thought content:   WNL  Sensory/Perceptual disturbances:   WNL  Orientation: oriented to person, place, time/date, situation, day of week, month of year, and year  Attention: Good  Concentration: Good   Memory: WNL  Fund of knowledge:  Good  Insight:   Good  Judgment:  Good  Impulse Control: Good   Risk Assessment: Danger to Self:  No Self-injurious Behavior: No Danger to Others: No Duty to Warn:no Physical Aggression / Violence:No  Access to Firearms a concern: No  Gang Involvement:No   Subjective: It has been a frustrating week for the patient.  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  Part it was her fault but it could have been called at a couple of different times.  Thankfully could not be fixed but the patient does not like making mistakes.  She is noticing more in the morning after she goes down to let her dog out and goes back in the house that she is having lightheaded and dizzy type spells.  One morning this week she had to sit down on the kitchen floor to avoid passing out and had to sit there for 15 minutes.  Her son-in-law came by and ask while she was sitting on the floor but did not offer help and Going to do whatever he was doing.  Her daughter came in and saw it and did not offer to help but told her she needed to sit in her recliner.  She is trying to figure out what is causing those dizzy spells and has reached out to the electrical cardiac specialist and is waiting for an answer.  Medication changes that helped to slow down some of the palpitations for which she is thankful.  There is some anticipatory anxiety about the biopsy of her thyroid tomorrow.  She is not as concerned about the procedure as the outcome.  She knows it will be up to 2 weeks before she hears we talked about ways to cope with waiting for that answer.  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  They are going to look at the spot on her lung some time in November.  Her anxiety comes from the fact that her husband had cancer and died of cancer.  We talked about challenging those thoughts and waiting on answers and using coping skills in that way time. The  patient did say that she felt some benefit from Lexapro as she is tapering up on that and off Viibryd.  She has been hesitant to take the as needed anxiety medication  as her mother had become addicted to it.  Encouraged her to have that conversation with her doctor. Plan: I will meet with the patient weekly via care agility.  Treatment plan: We will use cognitive behavioral therapy as well as person centered and supportive therapy in addition to elements of dialectical behavior therapy to help reduce the patient's anxiety and depression by at least 50% with a target date of October 06, 2023.  Goals for improving depression may include having less sadness as indicated by patient report and scores on the PHQ-9, have improved mood and return to a healthier level of functioning, identify causes including environment for depressed mood and learn ways to cope with depression especially those connected to her medical issues.  Interventions include using cognitive behavioral therapy to explore and replace thoughts and behaviors.  We will look at how depression is experienced in day-to-day living and encouraged sharing of feelings.  We will encourage the use of coping skills for management of depressive symptoms.  Goals for reducing anxiety are to improve her ability to manage anxiety symptoms, better handle stress, identify causes for anxiety and explore ways to lower it, resolve the core conflicts contributing to anxiety as well as manage thoughts and worrisome thinking contributing to feelings of anxiety.  Interventions will include providing education about anxiety, facilitate problem solution skills to help her identify options for resolving stress, teach coping skills for managing anxiety as well as mindfulness and communication in terms of family to reduce her stress level, use cognitive behavior therapy to identify and change anxiety provoking thought and behavior patterns as well as teach distress tolerance and  mindfulness skills for alleviating anxiety. Progress: 30%  [name], [facility]                  [name], [facility]               [name], [facility]               [name], [facility]               [name], [facility]               [name], [facility]               [name], [facility]               [name], [facility]               [name], [facility]               [name], [facility]         [facility] Counselor/Therapist Progress Note  Patient ID: Paula Stout, MRN: [id number],    Date: 08/17/2023  Time Spent: 55 minutes, 2:00 PM  to 2:56 spent with the patient.This session was held via video teletherapy. The patient consented to the video teletherapy and was located in her home during this session. She is aware it is the responsibility of the patient to secure confidentiality on her end of the session. The provider was in a private home  for the duration of this session.      Treatment Type: Individual Therapy  Reported Symptoms: Anxiety, depression  Mental Status Exam: Appearance:  Casual     Behavior: Appropriate  Motor: Normal  Speech/Language:  Normal Rate  Affect: Appropriate  Mood: normal  Thought process: normal  Thought  content:   WNL  Sensory/Perceptual disturbances:   WNL  Orientation: oriented to person, place, time/date, situation, day of week, month of year, and year  Attention: Good  Concentration: Good  Memory: WNL  Fund of knowledge:  Good  Insight:   Good  Judgment:  Good  Impulse Control: Good   Risk Assessment: Danger to Self:  No Self-injurious Behavior: No Danger to Others: No Duty to Warn:no Physical Aggression / Violence:No  Access to Firearms a concern: No  Gang Involvement:No   Subjective:   Plan: I will meet with the patient weekly via care  agility.  Treatment plan: We will use cognitive behavioral therapy as well as person centered and supportive therapy in addition to elements of dialectical behavior therapy to help reduce the patient's anxiety and depression by at least 50% with a target date of October 06, 2023.  Goals for improving depression may include having less sadness as indicated by patient report and scores on the PHQ-9, have improved mood and return to a healthier level of functioning, identify causes including environment for depressed mood and learn ways to cope with depression especially those connected to her medical issues.  Interventions include using cognitive behavioral therapy to explore and replace thoughts and behaviors.  We will look at how depression is experienced in day-to-day living and encouraged sharing of feelings.  We will encourage the use of coping skills for management of depressive symptoms.  Goals for reducing anxiety are to improve her ability to manage anxiety symptoms, better handle stress, identify causes for anxiety and explore ways to lower it, resolve the core conflicts contributing to anxiety as well as manage thoughts and worrisome thinking contributing to feelings of anxiety.  Interventions will include providing education about anxiety, facilitate problem solution skills to help her identify options for resolving stress, teach coping skills for managing anxiety as well as mindfulness and communication in terms of family to reduce her stress level, use cognitive behavior therapy to identify and change anxiety provoking thought and behavior patterns as well as teach distress tolerance and mindfulness skills for alleviating anxiety. Progress: 30%  [name], [facility]                  [name], [facility]               [name], [facility]               [name], [facility]               [name],  [facility]               [name], [facility]               [name], [facility]               [name], [facility]               [name], [facility]               [name], [facility]               [name], [facility]  [name], [facility]               [name], [facility]               [name], [facility]  [name], [facility]

## 2023-08-19 ENCOUNTER — Encounter: Payer: Self-pay | Admitting: Neurology

## 2023-08-19 ENCOUNTER — Ambulatory Visit: Payer: Medicare (Managed Care) | Admitting: Neurology

## 2023-08-22 ENCOUNTER — Telehealth: Payer: Self-pay | Admitting: Pharmacy Technician

## 2023-08-22 ENCOUNTER — Other Ambulatory Visit (HOSPITAL_COMMUNITY): Payer: Self-pay

## 2023-08-22 NOTE — Telephone Encounter (Signed)
PA is good through 11/19/23. New Encounter created for follow up. For additional info see Pharmacy Prior Auth telephone encounter from 08/22/23.

## 2023-08-22 NOTE — Telephone Encounter (Signed)
Pharmacy Patient Advocate Encounter   Received notification from Pt Calls Messages that prior authorization for Botox 200u renewal is required/requested.   Insurance verification completed.   The patient is insured through [employer] .   Per test claim: PA is not needed at this time. Last PA approved 11/19/22 is good through 11/19/23 with a $0 copay.

## 2023-08-23 ENCOUNTER — Other Ambulatory Visit: Payer: Self-pay | Admitting: Family Medicine

## 2023-09-07 ENCOUNTER — Encounter: Payer: Self-pay | Admitting: Family Medicine

## 2023-09-17 ENCOUNTER — Encounter: Payer: Self-pay | Admitting: Family Medicine

## 2023-09-17 DIAGNOSIS — R55 Syncope and collapse: Secondary | ICD-10-CM | POA: Diagnosis not present

## 2023-09-17 DIAGNOSIS — S299XXA Unspecified injury of thorax, initial encounter: Secondary | ICD-10-CM | POA: Diagnosis not present

## 2023-09-17 DIAGNOSIS — S199XXA Unspecified injury of neck, initial encounter: Secondary | ICD-10-CM | POA: Diagnosis not present

## 2023-09-17 DIAGNOSIS — S0990XA Unspecified injury of head, initial encounter: Secondary | ICD-10-CM | POA: Diagnosis not present

## 2023-09-17 DIAGNOSIS — S3993XA Unspecified injury of pelvis, initial encounter: Secondary | ICD-10-CM | POA: Diagnosis not present

## 2023-09-17 DIAGNOSIS — S3991XA Unspecified injury of abdomen, initial encounter: Secondary | ICD-10-CM | POA: Diagnosis not present

## 2023-09-17 DIAGNOSIS — S3992XA Unspecified injury of lower back, initial encounter: Secondary | ICD-10-CM | POA: Diagnosis not present

## 2023-09-18 DIAGNOSIS — R55 Syncope and collapse: Secondary | ICD-10-CM | POA: Insufficient documentation

## 2023-09-19 ENCOUNTER — Encounter: Payer: Self-pay | Admitting: Family Medicine

## 2023-09-19 ENCOUNTER — Other Ambulatory Visit: Payer: Self-pay

## 2023-09-19 ENCOUNTER — Encounter: Payer: Self-pay | Admitting: Cardiology

## 2023-09-19 MED ORDER — ONDANSETRON 4 MG PO TBDP: 4.0000 mg | ORAL_TABLET | Freq: Three times a day (TID) | ORAL | 0 refills | Status: DC | PRN

## 2023-09-20 ENCOUNTER — Ambulatory Visit: Payer: Medicare (Managed Care) | Admitting: Family Medicine

## 2023-09-20 ENCOUNTER — Ambulatory Visit: Payer: Medicare (Managed Care) | Admitting: Cardiology

## 2023-09-20 NOTE — Telephone Encounter (Signed)
 Called pt lvm to try scheduled today at 230 for f/u. Ask for her to call back.

## 2023-09-21 ENCOUNTER — Other Ambulatory Visit: Payer: Self-pay | Admitting: Family Medicine

## 2023-09-26 ENCOUNTER — Encounter (INDEPENDENT_AMBULATORY_CARE_PROVIDER_SITE_OTHER): Payer: Medicare (Managed Care) | Admitting: Family Medicine

## 2023-09-26 ENCOUNTER — Ambulatory Visit: Payer: Medicare (Managed Care) | Admitting: Cardiology

## 2023-09-26 DIAGNOSIS — B3731 Acute candidiasis of vulva and vagina: Secondary | ICD-10-CM | POA: Diagnosis not present

## 2023-09-26 MED ORDER — FLUCONAZOLE 150 MG PO TABS
ORAL_TABLET | ORAL | 0 refills | Status: DC
Start: 2023-09-26 — End: 2023-11-02

## 2023-09-26 NOTE — Telephone Encounter (Signed)
Please see the MyChart message reply(ies) for my assessment and plan.  The patient gave consent for this Medical Advice Message and is aware that it may result in a bill to their insurance company as well as the possibility that this may result in a co-payment or deductible. They are an established patient, but are not seeking medical advice exclusively about a problem treated during an in person or video visit in the last 7 days. I did not recommend an in person or video visit within 7 days of my reply.  I spent a total of 5 minutes cumulative time within 7 days through MyChart messaging Zuleyka Kloc Paul Shianne Zeiser, DO  

## 2023-10-03 ENCOUNTER — Telehealth: Payer: Self-pay | Admitting: Family Medicine

## 2023-10-03 ENCOUNTER — Ambulatory Visit: Payer: Self-pay | Admitting: Family Medicine

## 2023-10-03 NOTE — Telephone Encounter (Signed)
  Chief Complaint: hospital follow up  Symptoms: post fall x 2 Frequency: 2 weeks ago Pertinent Negatives: Patient denies current fall, injury, syncope Disposition: [] ED /[] Urgent Care (no appt availability in office) / [x] Appointment(In office/virtual)/ []  [facility]/ [] Home Care/ [] Refused Recommended Disposition /[] [facility]/ []  Follow-up with PCP Additional Notes: Patient reports 2 falls two weeks ago while staying at daughter's house in [place]. Reports was hospitalized with the first fall, and has followed up with local doctor in [place]. Reports she wants to F/U with PCP when back in [place]. Denies safety issues, current falls, syncope, injuries. Pt reports currently recovering from bruises. Scheduled patient per protocol on 10/17/2023. Patient verbalized understanding and to call back with worsening symptoms.    Copied from CRM [id number]. Topic: Clinical - Red Word Triage >> Oct 03, 2023 11:10 AM Elizebeth Brooking wrote: [facility] that prompted transfer to Nurse Triage: Patient stated that she has been experiencing a couple falls, believes she has thrush  Currently in [place] Reason for Disposition  [1] Fall AND [2] went to emergency department for evaluation or treatment  Answer Assessment - Initial Assessment Questions 1. MECHANISM: "How did the fall happen?"     Blacked out and fell - hit head on hardwood floor on 09/19/23 Hospitalized x 2 days - reports fall d/t dehydration, syncope, and bladder infection 2. DOMESTIC VIOLENCE AND ELDER ABUSE SCREENING: "Did you fall because someone pushed you or tried to hurt you?" If Yes, ask: "Are you safe now?"     Currently in [place] staying with daughter. Feels safe at daughter's house 3. ONSET: "When did the fall happen?" (e.g., minutes, hours, or days ago)     09/19/23 09/24/23 bounced around like a ping pong ball while trying to use bathroom - reports bruises went to MD and should take a gait balance class 4. LOCATION:  "What part of the body hit the ground?" (e.g., back, buttocks, head, hips, knees, hands, head, stomach)     Multiple falls, head and generalized 5. INJURY: "Did you hurt (injure) yourself when you fell?" If Yes, ask: "What did you injure? Tell me more about this?" (e.g., body area; type of injury; pain severity)"     Generalized bruising 6. PAIN: "Is there any pain?" If Yes, ask: "How bad is the pain?" (e.g., Scale 1-10; or mild,  moderate, severe)   - NONE (0): No pain   - MILD (1-3): Doesn't interfere with normal activities    - MODERATE (4-7): Interferes with normal activities or awakens from sleep    - SEVERE (8-10): Excruciating pain, unable to do any normal activities      Moderate - can't walk around as much Coughing/sneezing send sharp pain thru body 7. SIZE: For cuts, bruises, or swelling, ask: "How large is it?" (e.g., inches or centimeters)      Generalized bruising  9. OTHER SYMPTOMS: "Do you have any other symptoms?" (e.g., dizziness, fever, weakness; new onset or worsening).      Occasional dizziness - when getting up too fast and starts walking 10. CAUSE: "What do you think caused the fall (or falling)?" (e.g., tripped, dizzy spell)       Reports hospital decreased metroprolol - reported to MD and did not approve modification of med  Protocols used: Falls and [facility]

## 2023-10-03 NOTE — Telephone Encounter (Signed)
Lvm 2 triage pt based on appt notes on 2/10 appt.

## 2023-10-13 ENCOUNTER — Ambulatory Visit: Payer: Medicare (Managed Care) | Admitting: Behavioral Health

## 2023-10-13 ENCOUNTER — Encounter: Payer: Self-pay | Admitting: Behavioral Health

## 2023-10-13 DIAGNOSIS — F331 Major depressive disorder, recurrent, moderate: Secondary | ICD-10-CM

## 2023-10-13 DIAGNOSIS — F411 Generalized anxiety disorder: Secondary | ICD-10-CM | POA: Diagnosis not present

## 2023-10-13 DIAGNOSIS — F319 Bipolar disorder, unspecified: Secondary | ICD-10-CM

## 2023-10-13 NOTE — Progress Notes (Addendum)
 [facility] Counselor/Therapist Progress Note  Patient ID: Paula Stout, MRN: [id number],    Date: 10/13/2023  Time Spent: 57 minutes, 2 PM until 2:57 PM . The patient consented to the video teletherapy and was located in her home during this session. She is aware it is the responsibility of the patient to secure confidentiality on her end of the session. The provider was in a private home  for the duration of this session.  The session began as a video session the Wi-Fi/Internet was sketchy so we had to finish via phone session.  Total time for the video session was from 2 PM until 2:10 PM and the balance of the session from 2:10 PM until 2:57 PM was via audio.     Treatment Type: Individual Therapy  Reported Symptoms: Anxiety, depression  Mental Status Exam: Appearance:  Casual     Behavior: Appropriate  Motor: Normal  Speech/Language:  Normal Rate  Affect: Appropriate  Mood: normal  Thought process: normal  Thought content:   WNL  Sensory/Perceptual disturbances:   WNL  Orientation: oriented to person, place, time/date, situation, day of week, month of year, and year  Attention: Good  Concentration: Good  Memory: WNL  Fund of knowledge:  Good  Insight:   Good  Judgment:  Good  Impulse Control: Good   Risk Assessment: Danger to Self:  No Self-injurious Behavior: No Danger to Others: No Duty to Warn:no Physical Aggression / Violence:No  Access to Firearms a concern: No  Gang Involvement:No   Subjective: The patient enjoyed her visit out to [place]  to visit 3 of her children.  The fourth was able to come for 4 or 5 days from another state.  There were 3 incidences 1 of which was a panic attack in a crowded restaurant and she attributed that to the number of people in the volume of noise there.  Another time she got her feet tangled up and per her description face planted bruising her face and cracking some ribs.  Third time she got up to go to the  bathroom in the middle of the night he got a little disoriented being in a place she had not been in a while and also fell down.  She did go see her daughter's primary care doctor after both of the falls and thankfully it was not worse but said she really liked her daughter's daughter and feels that could be a good fit for her moving forward.  She had lengthy conversations with her children about moving back out there.  There is an assisted living facility which should have availability in June or July and there is a possibility that she could stay with one of her daughters until then.  Her children are all discussing that with her with the exception of the daughter that she lives with.  She knows she cannot continue to live in this environment for her old physical and mental health and so she is trying to facilitate making moved to [place] take place.  She feels more confident now that she would have a network of doctors out there. She is having a new CT scan on her lungs tomorrow and hopes to have good news and has a follow-up visit with her doctor next week.  I encouraged her to think positively about that but to use coping skills for relaxation for the scan tomorrow and when she meets with her doctor next week.  She does contract for safety having no  thoughts of hurting herself or anyone else. Diagnosis: Generalized anxiety disorder, major depressive disorder, moderate, recurrent Plan: I will meet with the patient weekly via care agility.  Treatment plan: We will use cognitive behavioral therapy as well as person centered and supportive therapy in addition to elements of dialectical behavior therapy to help reduce the patient's anxiety and depression by at least 50% with a target date of October 06, 2023.  Goals for improving depression may include having less sadness as indicated by patient report and scores on the PHQ-9, have improved mood and return to a healthier level of functioning,  identify causes including environment for depressed mood and learn ways to cope with depression especially those connected to her medical issues.  Interventions include using cognitive behavioral therapy to explore and replace thoughts and behaviors.  We will look at how depression is experienced in day-to-day living and encouraged sharing of feelings.  We will encourage the use of coping skills for management of depressive symptoms.  Goals for reducing anxiety are to improve her ability to manage anxiety symptoms, better handle stress, identify causes for anxiety and explore ways to lower it, resolve the core conflicts contributing to anxiety as well as manage thoughts and worrisome thinking contributing to feelings of anxiety.  Interventions will include providing education about anxiety, facilitate problem solution skills to help her identify options for resolving stress, teach coping skills for managing anxiety as well as mindfulness and communication in terms of family to reduce her stress level, use cognitive behavior therapy to identify and change anxiety provoking thought and behavior patterns as well as teach distress tolerance and mindfulness skills for alleviating anxiety. Progress: 30% progress notes were reviewed with a new extended target date of April 04, 2024.  Lorrene CHRISTELLA Hasten, [facility]                  Lorrene CHRISTELLA Hasten, [facility]               Lorrene CHRISTELLA Hasten, [facility]               Lorrene CHRISTELLA Hasten, [facility]               Lorrene CHRISTELLA Hasten, [facility]               Lorrene CHRISTELLA Hasten, [facility]               Lorrene CHRISTELLA Hasten, [facility]               Lorrene CHRISTELLA Hasten, [facility]               Lorrene CHRISTELLA Hasten, [facility]               Lorrene CHRISTELLA Hasten, [facility]                     Lorrene CHRISTELLA Hasten, [facility]               Lorrene CHRISTELLA Hasten, [facility]               Lorrene CHRISTELLA Hasten,  [facility]               Lorrene CHRISTELLA Hasten, [facility]               Lorrene CHRISTELLA Hasten, [facility]               Lorrene CHRISTELLA Hasten, [facility]               Lorrene  CHRISTELLA Hasten, [facility]               Lorrene CHRISTELLA Hasten, [facility]

## 2023-10-13 NOTE — Progress Notes (Incomplete Revision)
[facility] Counselor/Therapist Progress Note  Patient ID: Onelia Cadmus, MRN: [id number],    Date: 10/13/2023  Time Spent: 57 minutes, 2 PM until 2:57 PM . The patient consented to the video teletherapy and was located in her home during this session. She is aware it is the responsibility of the patient to secure confidentiality on her end of the session. The provider was in a private home  for the duration of this session.  The session began as a video session the Wi-Fi/Internet was sketchy so we had to finish via phone session.     Treatment Type: Individual Therapy  Reported Symptoms: Anxiety, depression  Mental Status Exam: Appearance:  Casual     Behavior: Appropriate  Motor: Normal  Speech/Language:  Normal Rate  Affect: Appropriate  Mood: normal  Thought process: normal  Thought content:   WNL  Sensory/Perceptual disturbances:   WNL  Orientation: oriented to person, place, time/date, situation, day of week, month of year, and year  Attention: Good  Concentration: Good  Memory: WNL  Fund of knowledge:  Good  Insight:   Good  Judgment:  Good  Impulse Control: Good   Risk Assessment: Danger to Self:  No Self-injurious Behavior: No Danger to Others: No Duty to Warn:no Physical Aggression / Violence:No  Access to Firearms a concern: No  Gang Involvement:No   Subjective: The patient enjoyed her visit out to [place] to visit 3 of her children.  The fourth was able to come for 4 or 5 days from another state.  There were 3 incidences 1 of which was a panic attack in a crowded restaurant and she attributed that to the number of people in the volume of noise there.  Another time she got her feet tangled up and per her description face planted bruising her face and cracking some ribs.  Third time she got up to go to the bathroom in the middle of the night he got a little disoriented being in a place she had not been in a while and also fell down.  She did go  see her daughter's primary care doctor after both of the falls and thankfully it was not worse but said she really liked her daughter's daughter and feels that could be a good fit for her moving forward.  She had lengthy conversations with her children about moving back out there.  There is an assisted living facility which should have availability in June or July and there is a possibility that she could stay with one of her daughters until then.  Her children are all discussing that with her with the exception of the daughter that she lives with.  She knows she cannot continue to live in this environment for her old physical and mental health and so she is trying to facilitate making moved to [place] take place.  She feels more confident now that she would have a network of doctors out there. She is having a new CT scan on her lungs tomorrow and hopes to have good news and has a follow-up visit with her doctor next week.  I encouraged her to think positively about that but to use coping skills for relaxation for the scan tomorrow and when she meets with her doctor next week.  She does contract for safety having no thoughts of hurting herself or anyone else. Diagnosis: Generalized anxiety disorder, major depressive disorder, moderate, recurrent Plan: I will meet with the patient weekly via care agility.  Treatment  plan: We will use cognitive behavioral therapy as well as person centered and supportive therapy in addition to elements of dialectical behavior therapy to help reduce the patient's anxiety and depression by at least 50% with a target date of October 06, 2023.  Goals for improving depression may include having less sadness as indicated by patient report and scores on the PHQ-9, have improved mood and return to a healthier level of functioning, identify causes including environment for depressed mood and learn ways to cope with depression especially those connected to her medical issues.   Interventions include using cognitive behavioral therapy to explore and replace thoughts and behaviors.  We will look at how depression is experienced in day-to-day living and encouraged sharing of feelings.  We will encourage the use of coping skills for management of depressive symptoms.  Goals for reducing anxiety are to improve her ability to manage anxiety symptoms, better handle stress, identify causes for anxiety and explore ways to lower it, resolve the core conflicts contributing to anxiety as well as manage thoughts and worrisome thinking contributing to feelings of anxiety.  Interventions will include providing education about anxiety, facilitate problem solution skills to help her identify options for resolving stress, teach coping skills for managing anxiety as well as mindfulness and communication in terms of family to reduce her stress level, use cognitive behavior therapy to identify and change anxiety provoking thought and behavior patterns as well as teach distress tolerance and mindfulness skills for alleviating anxiety. Progress: 30% progress notes were reviewed with a new extended target date of April 04, 2024.  [name], [facility]                  [name], [facility]               [name], [facility]               [name], [facility]               [name], [facility]               [name], [facility]               [name], [facility]               [name], [facility]               [name], [facility]               [name], [facility]                     [name], [facility]               [name], [facility]               [name], [facility]               [name], [facility]               [name], [facility]               [name], [facility]               [name], [facility]               [name], [facility]

## 2023-10-14 ENCOUNTER — Emergency Department (HOSPITAL_BASED_OUTPATIENT_CLINIC_OR_DEPARTMENT_OTHER): Payer: Medicare (Managed Care)

## 2023-10-14 ENCOUNTER — Other Ambulatory Visit: Payer: Medicare (Managed Care)

## 2023-10-14 ENCOUNTER — Emergency Department (HOSPITAL_BASED_OUTPATIENT_CLINIC_OR_DEPARTMENT_OTHER)
Admission: EM | Admit: 2023-10-14 | Discharge: 2023-10-14 | Disposition: A | Discharge status: Home / Self Care | Payer: Medicare (Managed Care) | Attending: Emergency Medicine | Admitting: Emergency Medicine

## 2023-10-14 ENCOUNTER — Encounter: Payer: Self-pay | Admitting: Family Medicine

## 2023-10-14 ENCOUNTER — Encounter (HOSPITAL_BASED_OUTPATIENT_CLINIC_OR_DEPARTMENT_OTHER): Payer: Self-pay | Admitting: Radiology

## 2023-10-14 ENCOUNTER — Ambulatory Visit (HOSPITAL_BASED_OUTPATIENT_CLINIC_OR_DEPARTMENT_OTHER): Payer: Medicare (Managed Care)

## 2023-10-14 ENCOUNTER — Other Ambulatory Visit: Payer: Self-pay | Admitting: Family

## 2023-10-14 ENCOUNTER — Other Ambulatory Visit: Payer: Self-pay

## 2023-10-14 DIAGNOSIS — E1122 Type 2 diabetes mellitus with diabetic chronic kidney disease: Secondary | ICD-10-CM | POA: Diagnosis not present

## 2023-10-14 DIAGNOSIS — Z7901 Long term (current) use of anticoagulants: Secondary | ICD-10-CM | POA: Diagnosis not present

## 2023-10-14 DIAGNOSIS — W19XXXA Unspecified fall, initial encounter: Secondary | ICD-10-CM

## 2023-10-14 DIAGNOSIS — R930 Abnormal findings on diagnostic imaging of skull and head, not elsewhere classified: Secondary | ICD-10-CM | POA: Insufficient documentation

## 2023-10-14 DIAGNOSIS — Z7951 Long term (current) use of inhaled steroids: Secondary | ICD-10-CM | POA: Insufficient documentation

## 2023-10-14 DIAGNOSIS — S99912A Unspecified injury of left ankle, initial encounter: Secondary | ICD-10-CM | POA: Diagnosis present

## 2023-10-14 DIAGNOSIS — Z79899 Other long term (current) drug therapy: Secondary | ICD-10-CM | POA: Diagnosis not present

## 2023-10-14 DIAGNOSIS — J45909 Unspecified asthma, uncomplicated: Secondary | ICD-10-CM | POA: Insufficient documentation

## 2023-10-14 DIAGNOSIS — S93492A Sprain of other ligament of left ankle, initial encounter: Secondary | ICD-10-CM | POA: Diagnosis not present

## 2023-10-14 DIAGNOSIS — N183 Chronic kidney disease, stage 3 unspecified: Secondary | ICD-10-CM | POA: Diagnosis not present

## 2023-10-14 DIAGNOSIS — M4692 Unspecified inflammatory spondylopathy, cervical region: Secondary | ICD-10-CM | POA: Diagnosis not present

## 2023-10-14 DIAGNOSIS — W108XXA Fall (on) (from) other stairs and steps, initial encounter: Secondary | ICD-10-CM | POA: Diagnosis not present

## 2023-10-14 DIAGNOSIS — I129 Hypertensive chronic kidney disease with stage 1 through stage 4 chronic kidney disease, or unspecified chronic kidney disease: Secondary | ICD-10-CM | POA: Diagnosis not present

## 2023-10-14 DIAGNOSIS — I4891 Unspecified atrial fibrillation: Secondary | ICD-10-CM | POA: Diagnosis not present

## 2023-10-14 DIAGNOSIS — Z7982 Long term (current) use of aspirin: Secondary | ICD-10-CM | POA: Insufficient documentation

## 2023-10-14 DIAGNOSIS — M47812 Spondylosis without myelopathy or radiculopathy, cervical region: Secondary | ICD-10-CM

## 2023-10-14 MED ORDER — ACETAMINOPHEN 500 MG PO TABS
1000.0000 mg | ORAL_TABLET | Freq: Once | ORAL | Status: AC
  Administered 2023-10-14: 1000 mg via ORAL
  Filled 2023-10-14: qty 2

## 2023-10-14 MED ORDER — LIDOCAINE 5 % EX PTCH: 1.0000 | MEDICATED_PATCH | CUTANEOUS | 0 refills | Status: DC

## 2023-10-14 MED ORDER — LIDOCAINE 5 % EX PTCH
2.0000 | MEDICATED_PATCH | CUTANEOUS | Status: DC
  Administered 2023-10-14: 2 via TRANSDERMAL
  Filled 2023-10-14: qty 2

## 2023-10-14 MED ORDER — TRAMADOL HCL 50 MG PO TABS: 50.0000 mg | ORAL_TABLET | Freq: Three times a day (TID) | ORAL | 0 refills | Status: DC | PRN

## 2023-10-14 NOTE — ED Notes (Signed)
 Patient transported to CT

## 2023-10-14 NOTE — ED Notes (Signed)
 Pt also endorses left ankle pain.

## 2023-10-14 NOTE — ED Triage Notes (Signed)
 Pt fell down about 6 steps today. Pt endorses this is the third time this month that this has happened. Pt states that she blacked out as with all her falls. Has been seen for same.

## 2023-10-14 NOTE — ED Provider Notes (Signed)
 [place] [facility] AT [facility] Provider Note   CSN: [id number] Arrival date & time: 10/14/23  0044     History  Chief Complaint  Patient presents with   [place]    Paula Stout is a 68 y.o. female.  The history is provided by the patient.  Fall This is a recurrent problem. The current episode started 3 to 5 hours ago. The problem occurs rarely. The problem has been resolved. Pertinent negatives include no chest pain, no abdominal pain and no shortness of breath. Associated symptoms comments: Talibone pain and left ankle pain . Nothing aggravates the symptoms. Nothing relieves the symptoms. She has tried nothing for the symptoms. The treatment provided no relief.  Patient with Bipolar 1 and on anticoagulation presents post fall. She was walking up the stairs with crocs and fell back striking head and twisting left ankle.      Past Medical History:  Diagnosis Date   ACS (acute coronary syndrome) (HCC) 07/31/2021   Adhesive capsulitis of left shoulder 12/16/2016   Asthma without status asthmaticus 02/06/2021   At risk for falls 02/04/2019   Atrial fibrillation (HCC) 09/01/2014   Last Assessment & Plan:  Formatting of this note might be different from the original. A:  Chronic.  Sinus rhythm at this time.  States she was told this during admission at [facility] in the past. P:  On baby aspirin  at home will continue.  Low dose metoprolol  started.   Bipolar 1 disorder, depressed, moderate (HCC) 02/18/2020   Capsulitis of left shoulder 10/20/2021   Chronic anticoagulation 02/20/2020   Chronic atrial fibrillation (HCC) 02/18/2020   Chronic headache 10/11/2013   Chronic interstitial cystitis 12/28/2005   Chronic migraine without aura, intractable, without status migrainosus 06/24/2017   Colon cancer screening 04/15/2016   Last Assessment & Plan:  Formatting of this note might be different from the original. Will schedule for colonoscopy   Depression, recurrent  (HCC) 02/18/2020   Diabetes mellitus (HCC)    Dyslipidemia 02/20/2020   GAD (generalized anxiety disorder) 02/18/2020   GERD (gastroesophageal reflux disease) 11/05/2021   History of atrial fibrillation 06/24/2017   History of posttraumatic stress disorder (PTSD) 10/07/2016   Hyperosmolarity due to secondary diabetes mellitus (HCC) 02/06/2021   Impaired mobility and activities of daily living 11/14/2015   Irritable bowel syndrome (IBS)    Junctional tachycardia (HCC) 11/05/2021   Kidney stone 02/06/2021   Migraine without aura, not refractory 02/06/2021   Mixed hyperlipidemia 04/06/2021   Last Assessment & Plan:  Formatting of this note might be different from the original. 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 Controlled   Refractory migraine with aura 02/06/2021   Refractory migraine without aura 02/06/2021   Renal colic on left side 04/22/2017   Stage 3 chronic kidney disease (HCC) 02/20/2020   Unspecified adverse effect of other drug, medicinal and biological substance(995.29) 02/17/2006   Urinary tract obstruction 02/06/2021     Home Medications Prior to Admission medications   Medication Sig Start Date End Date Taking? Authorizing Provider  lidocaine  (LIDODERM ) 5 % Place 1 patch onto the skin daily. Remove & Discard patch within 12 hours or as directed by MD 10/14/23  Yes Kyrus Hyde, MD  acetaminophen  (TYLENOL ) 500 MG tablet Take 1,000 mg by mouth every 6 (six) hours as needed for mild pain.    [provider]  atorvastatin  (LIPITOR ) 80 MG tablet TAKE 1 TABLET DAILY 02/09/23   Frann Mabel Mt, DO  blood glucose meter kit and supplies  One Touch Ultra  Check blood sugars once daily Dx: E11.9 Patient taking differently: 1 each by Other route See admin instructions. One Touch Ultra  Check blood sugars once daily Dx: E11.9 10/01/21   Frann Mabel Mt, DO  botulinum toxin Type A  (BOTOX ) 200 units injection Inject 155 units IM into multiple site in the face,neck and head once every 90 days Patient taking differently: Inject 155 Units into the muscle See admin instructions. Inject 155 units IM into multiple site in the face,neck and head once every 90 days 06/15/22   Skeet Juliene SAUNDERS, DO  budesonide -formoterol  (SYMBICORT ) 80-4.5 MCG/ACT inhaler Inhale 2 puffs into the lungs 2 (two) times daily. 08/11/23   Cobb, Comer GAILS, NP  buPROPion  (WELLBUTRIN  XL) 300 MG 24 hr tablet TAKE 1 TABLET DAILY 08/17/23   Teresa Redell LABOR, NP  diltiazem  (CARDIZEM  CD) 120 MG 24 hr capsule Take 1 capsule (120 mg total) by mouth daily. 07/19/23   Camnitz, Soyla Lunger, MD  diltiazem  (CARDIZEM ) 30 MG tablet Take 1 tablet every 4 hours AS NEEDED for AFIB or SVT heart rate >95 as long as top BP >100. 12/03/22   Riddle, Suzann, NP  ELIQUIS  5 MG TABS tablet TAKE 1 TABLET TWICE A DAY 02/02/23   Wendling, Mabel Mt, DO  EPINEPHrine  0.3 mg/0.3 mL IJ SOAJ injection Inject 0.3 mg into the muscle as needed for anaphylaxis. 11/17/20   Frann Mabel Mt, DO  escitalopram  (LEXAPRO ) 10 MG tablet TAKE 1 TABLET DAILY 08/17/23   Teresa Redell LABOR, NP  ezetimibe  (ZETIA ) 10 MG tablet TAKE 1 TABLET DAILY 02/03/23   Frann Mabel Mt, DO  FARXIGA  10 MG TABS tablet TAKE 1 TABLET DAILY 08/09/23   Frann Mabel Mt, DO  fenofibrate  160 MG tablet Take 1 tablet (160 mg total) by mouth daily. 09/21/23   Frann Mabel Mt, DO  fluconazole  (DIFLUCAN ) 150 MG tablet Take 1 tab, repeat in 72 hours if no improvement. 09/26/23   Frann Mabel Mt, DO  fluticasone  (FLONASE ) 50 MCG/ACT nasal spray Place 2 sprays into both nostrils daily. Patient taking differently: Place 2  sprays into both nostrils 3 (three) times a week. 01/29/23   Lavell Bari LABOR, FNP  Fremanezumab -vfrm (AJOVY ) 225 MG/1.5ML SOAJ Inject 225 mg into the skin every 28 (twenty-eight) days. 07/28/23   Skeet Juliene SAUNDERS, DO  levalbuterol  (XOPENEX  HFA) 45 MCG/ACT inhaler Inhale 2 puffs into the lungs every 6 (six) hours as needed for wheezing or shortness of breath. 08/11/23   Cobb, Katherine V, NP  levocetirizine (XYZAL ) 5 MG tablet TAKE 1 TABLET EVERY EVENING Patient taking differently: Take 5 mg by mouth every evening. 01/14/23   Frann Mabel Mt, DO  LORazepam  (ATIVAN ) 0.5 MG tablet Take 1 tablet (0.5 mg total) by mouth every 8 (eight) hours. 07/18/23   Teresa Redell LABOR, NP  Lurasidone  HCl 120 MG TABS Take 1 tablet (120 mg total) by mouth at bedtime. 06/29/23   Teresa Redell LABOR, NP  magnesium  oxide (MAG-OX) 400 (240 Mg) MG tablet Take 400 mg by mouth daily.    [provider]  metoCLOPramide  (REGLAN ) 10 MG tablet Take 1 tablet (10 mg total) by mouth every 6 (six)  hours. 06/30/23   Kingsley, Victoria K, DO  metoprolol  succinate (TOPROL -XL) 100 MG 24 hr tablet Take 1 tablet (100 mg total) by mouth 2 (two) times daily. Take with or immediately following a meal. 04/12/23   Camnitz, Soyla Lunger, MD  metoprolol  tartrate (LOPRESSOR ) 25 MG tablet Take 1 tablet (25 mg total) by mouth every 4 (four) hours as needed (PALPITATIONS). 08/24/22   Camnitz, Soyla Lunger, MD  MOUNJARO  12.5 MG/0.5ML Pen INJECT 12.5 MG UNDER THE SKIN WEEKLY 07/18/23   Frann Mabel Mt, DO  Multiple Vitamins-Minerals (MULTIVITAMIN WITH MINERALS) tablet Take 1 tablet by mouth daily. Daily Diabetes Health Pack - MVI + chromium, fish oil + vit D, magnesium , vit C, alpha lipoic acid w/ green tea    [provider]  nitroGLYCERIN  (NITROSTAT ) 0.4 MG SL tablet Place 1 tablet (0.4 mg total) under the tongue every 5 (five) minutes as needed for chest pain. 03/16/23   Krasowski, Robert J, MD  ondansetron  (ZOFRAN -ODT) 4 MG  disintegrating tablet Take 1 tablet (4 mg total) by mouth every 8 (eight) hours as needed for nausea or vomiting. 09/19/23   Wendling, Mabel Mt, DO  ONETOUCH VERIO test strip USE DAILY TO CHECK BLOOD SUGAR 10/29/22   Frann, Mabel Mt, DO  pantoprazole  (PROTONIX ) 40 MG tablet Take 1 tablet (40 mg total) by mouth daily. 11/22/22   Frann Mabel Mt, DO  QUEtiapine  (SEROQUEL ) 300 MG tablet Take 1 tablet (300 mg total) by mouth at bedtime. 07/18/23   Teresa Redell LABOR, NP  Rimegepant Sulfate  (NURTEC) 75 MG TBDP Take 1 tablet (75 mg total) by mouth as needed (take 1 tab at the earlist onset of a migraine. Max 1 tab in 24 hours). 04/04/23   Skeet Juliene SAUNDERS, DO  topiramate  (TOPAMAX ) 50 MG tablet TAKE 3 TABLETS TWICE A DAY 08/23/23   Frann Mabel Mt, DO      Allergies    Bee venom, Onion, Tetanus-diphtheria toxoids td, Sumatriptan , Zolpidem , Asa [aspirin ], Iodine, Nsaids, Sulfa antibiotics, and Vancomycin    Review of Systems   Review of Systems  Respiratory:  Negative for shortness of breath.   Cardiovascular:  Negative for chest pain.  Gastrointestinal:  Negative for abdominal pain.  Musculoskeletal:  Positive for arthralgias.  All other systems reviewed and are negative.   Physical Exam Updated Vital Signs BP 100/64 (BP Location: Left Arm)   Pulse 90   Temp 98.4 F (36.9 C) (Tympanic)   Resp 17   Ht 5' 6 (1.676 m)   Wt 79.4 kg   SpO2 100%   BMI 28.25 kg/m  Physical Exam Vitals and nursing note reviewed.  Constitutional:      General: She is not in acute distress.    Appearance: Normal appearance. She is well-developed.  HENT:     Head: Normocephalic and atraumatic.     Nose: Nose normal.  Eyes:     Pupils: Pupils are equal, round, and reactive to light.  Cardiovascular:     Rate and Rhythm: Normal rate and regular rhythm.     Pulses: Normal pulses.     Heart sounds: Normal heart sounds.  Pulmonary:     Effort: Pulmonary effort is normal. No respiratory  distress.     Breath sounds: Normal breath sounds.  Abdominal:     General: Bowel sounds are normal. There is no distension.     Palpations: Abdomen is soft.     Tenderness: There is no abdominal tenderness. There is no guarding or rebound.  Musculoskeletal:  General: Normal range of motion.     Right wrist: No bony tenderness or snuff box tenderness.     Left wrist: No bony tenderness.     Cervical back: Normal, normal range of motion and neck supple. No spasms or tenderness.     Thoracic back: Normal. No spasms or tenderness.     Lumbar back: Normal. No spasms or tenderness.     Right lower leg: Normal.     Left lower leg: Normal.     Right ankle: No deformity or ecchymosis.     Right Achilles Tendon: Normal.     Left ankle: No deformity or ecchymosis.     Left Achilles Tendon: Normal.     Right foot: Normal. No swelling, deformity or crepitus.     Left foot: Normal. No swelling, deformity or crepitus.  Skin:    General: Skin is dry.     Capillary Refill: Capillary refill takes less than 2 seconds.     Findings: No erythema or rash.  Neurological:     General: No focal deficit present.     Mental Status: She is alert.     Deep Tendon Reflexes: Reflexes normal.  Psychiatric:        Thought Content: Thought content normal.     ED Results / Procedures / Treatments   Labs (all labs ordered are listed, but only abnormal results are displayed) Labs Reviewed - No data to display  EKG None  Radiology DG Hips Bilat W or Wo Pelvis 3-4 Views Result Date: 10/14/2023 CLINICAL DATA:  Status post fall. EXAM: DG HIP (WITH OR WITHOUT PELVIS) 3-4V BILAT COMPARISON:  None Available. FINDINGS: There is no evidence of hip fracture or dislocation. Mild degenerative changes seen involving both hips in the form of joint space narrowing and acetabular sclerosis. IMPRESSION: Mild degenerative changes without evidence of acute fracture or dislocation. Electronically Signed   By: Suzen Dials M.D.   On: 10/14/2023 02:19   CT Cervical Spine Wo Contrast Result Date: 10/14/2023 CLINICAL DATA:  Status post fall. EXAM: CT CERVICAL SPINE WITHOUT CONTRAST TECHNIQUE: Multidetector CT imaging of the cervical spine was performed without intravenous contrast. Multiplanar CT image reconstructions were also generated. RADIATION DOSE REDUCTION: This exam was performed according to the departmental dose-optimization program which includes automated exposure control, adjustment of the mA and/or kV according to patient size and/or use of iterative reconstruction technique. COMPARISON:  None Available. FINDINGS: Alignment: Normal. Skull base and vertebrae: No acute fracture. No primary bone lesion or focal pathologic process. Soft tissues and spinal canal: No prevertebral fluid or swelling. No visible canal hematoma. Disc levels: Moderate severity endplate sclerosis, moderate to marked severity anterior osteophyte formation and mild to moderate severity posterior bony spurring are seen at the levels of C3-C4, C4-C5, C5-C6 and C6-C7. Moderate severity intervertebral disc space narrowing is seen at C3-C4, C4-C5, C5-C6 and C6-C7. Moderate severity bilateral multilevel facet joint hypertrophy is noted Upper chest: Negative. Other: None. IMPRESSION: 1. Moderate to marked severity multilevel degenerative changes, as described above. 2. No acute cervical spine fracture or subluxation. Electronically Signed   By: Suzen Dials M.D.   On: 10/14/2023 01:46   CT Head Wo Contrast Result Date: 10/14/2023 CLINICAL DATA:  Status post fall. EXAM: CT HEAD WITHOUT CONTRAST TECHNIQUE: Contiguous axial images were obtained from the base of the skull through the vertex without intravenous contrast. RADIATION DOSE REDUCTION: This exam was performed according to the departmental dose-optimization program which includes automated exposure control, adjustment  of the mA and/or kV according to patient size and/or use of iterative  reconstruction technique. COMPARISON:  May 19, 2023 FINDINGS: Brain: There is mild cerebral atrophy with widening of the extra-axial spaces and ventricular dilatation. There are areas of decreased attenuation within the white matter tracts of the supratentorial brain, consistent with microvascular disease changes. Vascular: No hyperdense vessel or unexpected calcification. Skull: Normal. Negative for fracture or focal lesion. Sinuses/Orbits: No acute finding. Other: Mild right posterior parietal scalp soft tissue swelling is seen. IMPRESSION: 1. Mild right posterior parietal scalp soft tissue swelling without evidence of an acute fracture or acute intracranial abnormality. 2. Generalized cerebral atrophy with chronic white matter small vessel ischemic changes. Electronically Signed   By: Suzen Dials M.D.   On: 10/14/2023 01:44   DG Ankle Complete Left Result Date: 10/14/2023 CLINICAL DATA:  Initial evaluation for acute trauma, fall. EXAM: LEFT ANKLE COMPLETE - 3+ VIEW COMPARISON:  None Available. FINDINGS: No acute fracture or dislocation. Ankle mortise approximated. Talar dome intact. Mild soft tissue swelling about the ankle. Probable trace joint effusion noted. Osteoarthritic spurring noted about the ankle. Plantar calcaneal enthesophyte noted. Scattered vascular calcifications noted with within the lower leg. IMPRESSION: 1. Mild soft tissue swelling about the ankle with probable trace joint effusion. 2. No acute osseous abnormality. 3. Degenerative osteoarthritic changes about the ankle. Electronically Signed   By: Morene Hoard M.D.   On: 10/14/2023 01:31    Procedures Procedures    Medications Ordered in ED Medications  lidocaine  (LIDODERM ) 5 % 2 patch (2 patches Transdermal Patch Applied 10/14/23 0159)  acetaminophen  (TYLENOL ) tablet 1,000 mg (1,000 mg Oral Given 10/14/23 0159)    ED Course/ Medical Decision Making/ A&P                                 Medical Decision  Making Patient with fall on steps struck head several hours ago  Amount and/or Complexity of Data Reviewed External Data Reviewed: notes.    Details: Previous notes reviewed  Radiology: ordered and independent interpretation performed.    Details: Negative head CT.  Ankle without fracture   Risk OTC drugs. Prescription drug management. Risk Details: Ankle injury is a sprain.  Placed in ASO.  Ice for 20 minutes every 2 hours.  I have counseled patient against wearing crocs.  As patient has fallen before I have counseled patient on speaking with her PMD about gait assessment and possible PT.   Patient expresses understanding.  Stable for discharge.  Strict returns.      Final Clinical Impression(s) / ED Diagnoses Final diagnoses:  Fall, initial encounter  Cervical arthritis  Sprain of anterior talofibular ligament of left ankle, initial encounter    I have reviewed the triage vital signs and the nursing notes. Pertinent labs & imaging results that were available during my care of the patient were reviewed by me and considered in my medical decision making (see chart for details). After history, exam, and medical workup I feel the patient has been appropriately medically screened and is safe for discharge home. Pertinent diagnoses were discussed with the patient. Patient was given return precautions.  Rx / DC Orders ED Discharge Orders          Ordered    lidocaine  (LIDODERM ) 5 %  Every 24 hours        10/14/23 0225  Ahlijah Raia, MD 10/14/23 [phone]

## 2023-10-17 ENCOUNTER — Ambulatory Visit (HOSPITAL_BASED_OUTPATIENT_CLINIC_OR_DEPARTMENT_OTHER)
Admission: RE | Admit: 2023-10-17 | Discharge: 2023-10-17 | Disposition: A | Discharge status: Home / Self Care | Payer: Medicare (Managed Care) | Source: Ambulatory Visit | Attending: Nurse Practitioner | Admitting: Nurse Practitioner

## 2023-10-17 ENCOUNTER — Ambulatory Visit: Payer: Medicare (Managed Care) | Admitting: Family Medicine

## 2023-10-17 ENCOUNTER — Ambulatory Visit (INDEPENDENT_AMBULATORY_CARE_PROVIDER_SITE_OTHER): Payer: Medicare (Managed Care) | Admitting: Family Medicine

## 2023-10-17 ENCOUNTER — Encounter: Payer: Self-pay | Admitting: Family Medicine

## 2023-10-17 ENCOUNTER — Other Ambulatory Visit (HOSPITAL_BASED_OUTPATIENT_CLINIC_OR_DEPARTMENT_OTHER): Payer: Self-pay

## 2023-10-17 VITALS — BP 120/70 | HR 74 | Temp 97.0°F | Resp 16 | Ht 66.0 in | Wt 182.2 lb

## 2023-10-17 DIAGNOSIS — R911 Solitary pulmonary nodule: Secondary | ICD-10-CM | POA: Insufficient documentation

## 2023-10-17 DIAGNOSIS — R2689 Other abnormalities of gait and mobility: Secondary | ICD-10-CM

## 2023-10-17 MED ORDER — ZOSTER VAC RECOMB ADJUVANTED 50 MCG/0.5ML IM SUSR
0.5000 mL | Freq: Once | INTRAMUSCULAR | 0 refills | Status: AC
  Filled 2023-10-17: qty 0.5, 1d supply, fill #0

## 2023-10-17 MED ORDER — TRAMADOL HCL 50 MG PO TABS: 50.0000 mg | ORAL_TABLET | Freq: Three times a day (TID) | ORAL | 0 refills | Status: AC | PRN

## 2023-10-17 NOTE — Progress Notes (Signed)
 Musculoskeletal Exam  Patient: Paula Stout DOB: August 15, 1956  DOS: 10/17/2023  SUBJECTIVE:  Chief Complaint:   Chief Complaint  Patient presents with   Follow-up    Follow up fall    Paula Stout is a 68 y.o.  female for evaluation and treatment of L ankle and hip pain.   Onset:  3 days ago. +fall.  She has had 3 other falls in the past month.  1 involves losing balance on the stairs leading to her most recent situation.  1 episode took place in the middle of the night where she got her feet tangled in darkness.  Another episode happened when she tripped in the play room of her granddaughter.  She was found to be severely dehydrated and having a UTI. She does not feel dizzy. Normal MRI brain in 11/24.  Normal head CT 3 days ago. Location: L ankle, tailbone Character:  aching  Progression of issue:  ankle is improving, tailbone unchanged Associated symptoms: No bruising, swelling, redness Treatment: to date has been tramadol .   Neurovascular symptoms: no  Past Medical History:  Diagnosis Date   ACS (acute coronary syndrome) (HCC) 07/31/2021   Adhesive capsulitis of left shoulder 12/16/2016   Asthma without status asthmaticus 02/06/2021   At risk for falls 02/04/2019   Atrial fibrillation (HCC) 09/01/2014   Last Assessment & Plan:  Formatting of this note might be different from the original. A:  Chronic.  Sinus rhythm at this time.  States she was told this during admission at [facility] in the past. P:  On baby aspirin  at home will continue.  Low dose metoprolol  started.   Bipolar 1 disorder, depressed, moderate (HCC) 02/18/2020   Capsulitis of left shoulder 10/20/2021   Chronic anticoagulation 02/20/2020   Chronic atrial fibrillation (HCC) 02/18/2020   Chronic headache 10/11/2013   Chronic interstitial cystitis 12/28/2005   Chronic migraine without aura, intractable, without status migrainosus 06/24/2017   Colon cancer screening 04/15/2016   Last Assessment & Plan:   Formatting of this note might be different from the original. Will schedule for colonoscopy   Depression, recurrent (HCC) 02/18/2020   Diabetes mellitus (HCC)    Dyslipidemia 02/20/2020   GAD (generalized anxiety disorder) 02/18/2020   GERD (gastroesophageal reflux disease) 11/05/2021   History of atrial fibrillation 06/24/2017   History of posttraumatic stress disorder (PTSD) 10/07/2016   Hyperosmolarity due to secondary diabetes mellitus (HCC) 02/06/2021   Impaired mobility and activities of daily living 11/14/2015   Irritable bowel syndrome (IBS)    Junctional tachycardia (HCC) 11/05/2021   Kidney stone 02/06/2021   Migraine without aura, not refractory 02/06/2021   Mixed hyperlipidemia 04/06/2021   Last Assessment & Plan:  Formatting of this note might be different from the original. 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 Controlled   Refractory migraine with aura 02/06/2021   Refractory migraine without aura 02/06/2021   Renal colic on left side 04/22/2017   Stage 3 chronic kidney disease (HCC) 02/20/2020   Unspecified adverse effect of other drug, medicinal and biological substance(995.29) 02/17/2006   Urinary tract obstruction 02/06/2021    Objective: VITAL SIGNS: BP 120/70   Pulse 74   Temp (!) 97 F (  36.1 C) (Oral)   Resp 16   Ht 5\' 6"  (1.676 m)   Wt 182 lb 3.2 oz (82.6 kg)   SpO2 97%   BMI 29.41 kg/m  Constitutional: Well formed, well developed. No acute distress. Thorax & Lungs: No accessory muscle use Musculoskeletal: L ankle.   Tenderness to palpation: yes over deltoid ligament Deformity: no Ecchymosis: no Tests positive: none  Tests  negative: Anterior drawer She also has tenderness over her sacrum without deformity. Neurologic: Normal sensory function.  Gait is antalgic and cautious. Psychiatric: Normal mood. Age appropriate judgment and insight. Alert & oriented x 3.    Assessment:  Balance problem - Plan: Ambulatory referral to Physical Therapy  Plan: Stretches/exercises for the ankle, heat, ice, Tylenol .  Motion sensing lights recommended for home.  Set up with physical therapy to increase balance/strength. F/u as originally scheduled. The patient voiced understanding and agreement to the plan.   Shellie Dials [place], DO 10/17/23  11:36 AM

## 2023-10-17 NOTE — Patient Instructions (Signed)
If you do not hear anything about your referral in the next 1-2 weeks, call our office and ask for an update.  Ice/cold pack over area for 10-15 min twice daily.  Heat (pad or rice pillow in microwave) over affected area, 10-15 minutes twice daily.   OK to take Tylenol 1000 mg (2 extra strength tabs) or 975 mg (3 regular strength tabs) every 6 hours as needed.  Let us know if you need anything.  Ankle Exercises It is normal to feel mild stretching, pulling, tightness, or discomfort as you do these exercises, but you should stop right away if you feel sudden pain or your pain gets worse.  Stretching and range of motion exercises These exercises warm up your muscles and joints and improve the movement and flexibility of your ankle. These exercises also help to relieve pain, numbness, and tingling. Exercise A: Dorsiflexion/Plantar Flexion    Sit with your affected knee straight or bent. Do not rest your foot on anything. Flex your affected ankle to tilt the top of your foot toward your shin. Hold this position for 5 seconds. Point your toes downward to tilt the top of your foot away from your shin. Hold this position for 5 seconds. Repeat 2 times. Complete this exercise 3 times per week. Exercise B: Ankle Alphabet    Sit with your affected foot supported at your lower leg. Do not rest your foot on anything. Make sure your foot has room to move freely. Think of your affected foot as a paintbrush, and move your foot to trace each letter of the alphabet in the air. Keep your hip and knee still while you trace. Make the letters as large as you can without increasing any discomfort. Trace every letter from A to Z. Repeat 2 times. Complete this exercise 3 times per week. Strengthening exercises These exercises build strength and endurance in your ankle. Endurance is the ability to use your muscles for a long time, even after they get tired. Exercise D: Dorsiflexors    Secure a rubber  exercise band or tube to an object, such as a table leg, that will stay still when the band is pulled. Secure the other end around your affected foot. Sit on the floor, facing the object with your affected leg extended. The band or tube should be slightly tense when your foot is relaxed. Slowly flex your affected ankle and toes to bring your foot toward you. Hold this position for 3 seconds.  Slowly return your foot to the starting position, controlling the band as you do that. Do a total of 10 repetitions. Repeat 2 times. Complete this exercise 3 times per week. Exercise E: Plantar Flexors    Sit on the floor with your affected leg extended. Loop a rubber exercise band or tube around the ball of your affected foot. The ball of your foot is on the walking surface, right under your toes. The band or tube should be slightly tense when your foot is relaxed. Slowly point your toes downward, pushing them away from you. Hold this position for 3 seconds. Slowly release the tension in the band or tube, controlling smoothly until your foot is back in the starting position. Repeat for a total of 10 repetitions. Repeat 2 times. Complete this exercise 3 times per week. Exercise F: Towel Curls    Sit in a chair on a non-carpeted surface, and put your feet on the floor. Place a towel in front of your feet.  Keeping your  heel on the floor, put your affected foot on the towel. Pull the towel toward you by grabbing the towel with your toes and curling them under. Keep your heel on the floor. Let your toes relax. Grab the towel again. Keep going until the towel is completely underneath your foot. Repeat for a total of 10 repetitions. Repeat 2 times. Complete this exercise 3 times per week. Exercise G: Heel Raise ( Plantar Flexors, Standing)     Stand with your feet shoulder-width apart. Keep your weight spread evenly over the width of your feet while you rise up on your toes. Use a wall or table to  steady yourself, but try not to use it for support. If this exercise is too easy, try these options: Shift your weight toward your affected leg until you feel challenged. If told by your health care provider, lift your uninjured leg off the floor. Hold this position for 3 seconds. Repeat for a total of 10 repetitions. Repeat 2 times. Complete this exercise 3 times per week. Exercise H: Tandem Walking Stand with one foot directly in front of the other. Slowly raise your back foot up, lifting your heel before your toes, and place it directly in front of your other foot. Continue to walk in this heel-to-toe way for 10 steps or for as long as told by your health care provider. Have a countertop or wall nearby to use if needed to keep your balance, but try not to hold onto anything for support. Repeat 2 times. Complete this exercises 3 times per week. Make sure you discuss any questions you have with your health care provider. Document Released: 07/07/2005 Document Revised: 04/22/2016 Document Reviewed: 05/11/2015 Elsevier Interactive Patient Education  2018 [employer].

## 2023-10-18 ENCOUNTER — Ambulatory Visit: Payer: Medicare (Managed Care) | Admitting: Cardiology

## 2023-10-18 ENCOUNTER — Telehealth: Payer: Self-pay

## 2023-10-18 NOTE — Transitions of Care (Post Inpatient/ED Visit) (Unsigned)
   10/18/2023  Name: Paula Stout MRN: [id number] DOB: 1956/06/11  Today's TOC FU Call Status: Today's TOC FU Call Status:: Unsuccessful Call (1st Attempt) Unsuccessful Call (1st Attempt) Date: 10/18/23  Attempted to reach the patient regarding the most recent Inpatient/ED visit.  Follow Up Plan: Additional outreach attempts will be made to reach the patient to complete the Transitions of Care (Post Inpatient/ED visit) call.   Signature Karena Addison, LPN [facility] Nurse Health Advisor Direct Dial [phone]

## 2023-10-19 ENCOUNTER — Telehealth: Payer: Medicare (Managed Care) | Admitting: Behavioral Health

## 2023-10-19 ENCOUNTER — Encounter: Payer: Self-pay | Admitting: Behavioral Health

## 2023-10-19 ENCOUNTER — Encounter: Payer: Self-pay | Admitting: Family Medicine

## 2023-10-19 DIAGNOSIS — F3130 Bipolar disorder, current episode depressed, mild or moderate severity, unspecified: Secondary | ICD-10-CM

## 2023-10-19 DIAGNOSIS — F319 Bipolar disorder, unspecified: Secondary | ICD-10-CM

## 2023-10-19 DIAGNOSIS — F411 Generalized anxiety disorder: Secondary | ICD-10-CM | POA: Diagnosis not present

## 2023-10-19 DIAGNOSIS — F339 Major depressive disorder, recurrent, unspecified: Secondary | ICD-10-CM

## 2023-10-19 MED ORDER — LORAZEPAM 0.5 MG PO TABS: 0.5000 mg | ORAL_TABLET | Freq: Three times a day (TID) | ORAL | 1 refills | Status: DC

## 2023-10-19 MED ORDER — LURASIDONE HCL 120 MG PO TABS: 1.0000 | ORAL_TABLET | Freq: Every day | ORAL | 3 refills | Status: DC

## 2023-10-19 MED ORDER — ESCITALOPRAM OXALATE 10 MG PO TABS: 10.0000 mg | ORAL_TABLET | Freq: Every day | ORAL | 1 refills | Status: DC

## 2023-10-19 MED ORDER — BUPROPION HCL ER (XL) 300 MG PO TB24: 300.0000 mg | ORAL_TABLET | Freq: Every day | ORAL | 1 refills | Status: DC

## 2023-10-19 MED ORDER — QUETIAPINE FUMARATE 300 MG PO TABS: 300.0000 mg | ORAL_TABLET | Freq: Every day | ORAL | 3 refills | Status: DC

## 2023-10-19 NOTE — Progress Notes (Signed)
Paula Stout [id number] [birth year] 68 y.o.  Virtual Visit via Video Note  I connected with pt @ on 10/19/23 at  2:30 PM EST by a video enabled telemedicine application and verified that I am speaking with the correct person using two identifiers.   I discussed the limitations of evaluation and management by telemedicine and the availability of in person appointments. The patient expressed understanding and agreed to proceed.  I discussed the assessment and treatment plan with the patient. The patient was provided an opportunity to ask questions and all were answered. The patient agreed with the plan and demonstrated an understanding of the instructions.   The patient was advised to call back or seek an in-person evaluation if the symptoms worsen or if the condition fails to improve as anticipated.  I provided 30 minutes of non-face-to-face time during this encounter.  The patient was located at home.  The provider was located at [facility].   Joan Flores, NP   Subjective:   Patient ID:  Paula Stout is a 68 y.o. (DOB [birth year]) female.  Chief Complaint:  Chief Complaint  Patient presents with   Anxiety   Depression   Follow-up   Medication Refill   Patient Education    HPI "Paula Stout", 68 year old female presents to this office via video visit for follow up and medication management.  Collateral information should be considered reliable. Has had 3 recent falls. One of them leading to hospitalization for two day.  She will be going to rehab for balance issues next week. She reports her depression today at 3/10, and anxiety at 4/10.  She is sleeping 7 to 8 hours per night with the aid of Seroquel.  She denies any current mania, no history of psychosis or auditory or visual hallucinations.  No SI or HI.  Patient states that she feels safe and verbally contracts for safety today.   Past psychiatric medication  trials: Zoloft Prozac Cymbalta Lithium Depakote Valium Ativan Gabapentin Trazodone Ambien    Review of Systems:  Review of Systems  Medications: I have reviewed the patient's current medications.  Current Outpatient Medications  Medication Sig Dispense Refill   acetaminophen (TYLENOL) 500 MG tablet Take 1,000 mg by mouth every 6 (six) hours as needed for mild pain.     atorvastatin (LIPITOR) 80 MG tablet TAKE 1 TABLET DAILY 90 tablet 3   blood glucose meter kit and supplies One Touch Ultra  Check 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 One Touch Ultra  Check blood sugars once daily Dx: E11.9) 1 each 0   botulinum toxin Type A (BOTOX) 200 units injection Inject 155 units IM into multiple site in the face,neck and head once every 90 days (Patient taking differently: Inject 155 Units into the muscle See admin instructions. Inject 155 units IM into multiple site in the face,neck and head once every 90 days) 1 each 4   budesonide-formoterol (SYMBICORT) 80-4.5 MCG/ACT inhaler Inhale 2 puffs into the lungs 2 (two) times daily. 20.4 g 3   buPROPion (WELLBUTRIN XL) 300 MG 24 hr tablet Take 1 tablet (300 mg total) by mouth daily. 90 tablet 1   diltiazem (CARDIZEM CD) 120 MG 24 hr capsule Take 1 capsule (120 mg total) by mouth daily. 90 capsule 3   diltiazem (CARDIZEM) 30 MG tablet Take 1 tablet every 4 hours AS NEEDED for AFIB or SVT heart rate >95 as long as top BP >100. (Patient taking  differently: Take 30 mg by mouth every 4 (four) hours as needed (for AFIB or SVT heart rate >95 as long as top BP >100.). Take 1 tablet every 4 hours AS NEEDED for AFIB or SVT heart rate >95 as long as top BP >100.) 30 tablet 1   ELIQUIS 5 MG TABS tablet TAKE 1 TABLET TWICE A DAY 180 tablet 3   EPINEPHrine 0.3 mg/0.3 mL IJ SOAJ injection Inject 0.3 mg into the muscle as needed for anaphylaxis. 1 each 1   escitalopram (LEXAPRO) 10 MG tablet Take  1 tablet (10 mg total) by mouth daily. 90 tablet 1   ezetimibe (ZETIA) 10 MG tablet TAKE 1 TABLET DAILY 90 tablet 3   FARXIGA 10 MG TABS tablet TAKE 1 TABLET DAILY 90 tablet 3   fenofibrate 160 MG tablet Take 1 tablet (160 mg total) by mouth daily. 90 tablet 1   fluconazole (DIFLUCAN) 150 MG tablet Take 1 tab, repeat in 72 hours if no improvement. 2 tablet 0   fluticasone (FLONASE) 50 MCG/ACT nasal spray Place 2 sprays into both nostrils daily. (Patient taking differently: Place 2 sprays into both nostrils 3 (three) times a week.) 16 g 6   Fremanezumab-vfrm (AJOVY) 225 MG/1.5ML SOAJ Inject 225 mg into the skin every 28 (twenty-eight) days. 1.68 mL 11   levalbuterol (XOPENEX HFA) 45 MCG/ACT inhaler Inhale 2 puffs into the lungs every 6 (six) hours as needed for wheezing or shortness of breath. 15 g 2   levocetirizine (XYZAL) 5 MG tablet TAKE 1 TABLET EVERY EVENING (Patient taking differently: Take 5 mg by mouth every evening.) 90 tablet 3   lidocaine (LIDODERM) 5 % Place 1 patch onto the skin daily. Remove & Discard patch within 12 hours or as directed by MD 30 patch 0   LORazepam (ATIVAN) 0.5 MG tablet Take 1 tablet (0.5 mg total) by mouth every 8 (eight) hours. 30 tablet 1   Lurasidone HCl 120 MG TABS Take 1 tablet (120 mg total) by mouth at bedtime. 90 tablet 3   magnesium oxide (MAG-OX) 400 (240 Mg) MG tablet Take 400 mg by mouth daily.     metoCLOPramide (REGLAN) 10 MG tablet Take 1 tablet (10 mg total) by mouth every 6 (six) hours. 15 tablet 0   metoprolol succinate (TOPROL-XL) 100 MG 24 hr tablet Take 1 tablet (100 mg total) by mouth 2 (two) times daily. Take with or immediately following a meal. 180 tablet 3   metoprolol tartrate (LOPRESSOR) 25 MG tablet Take 1 tablet (25 mg total) by mouth every 4 (four) hours as needed (PALPITATIONS). 90 tablet 3   MOUNJARO 12.5 MG/0.5ML Pen INJECT 12.5 MG UNDER THE SKIN WEEKLY 2 mL 12   Multiple Vitamins-Minerals (MULTIVITAMIN WITH MINERALS) tablet Take 1  tablet by mouth daily. Daily Diabetes Health Pack - MVI + chromium, fish oil + vit D, magnesium, vit C, alpha lipoic acid w/ green tea     nitroGLYCERIN (NITROSTAT) 0.4 MG SL tablet Place 1 tablet (0.4 mg total) under the tongue every 5 (five) minutes as needed for chest pain. 25 tablet 11   ondansetron (ZOFRAN-ODT) 4 MG disintegrating tablet Take 1 tablet (4 mg total) by mouth every 8 (eight) hours as needed for nausea or vomiting. 30 tablet 0   ONETOUCH VERIO test strip USE DAILY TO CHECK BLOOD SUGAR 100 strip 3   pantoprazole (PROTONIX) 40 MG tablet Take 1 tablet (40 mg total) by mouth daily. 90 tablet 3   QUEtiapine (SEROQUEL) 300 MG  tablet Take 1 tablet (300 mg total) by mouth at bedtime. 90 tablet 3   Rimegepant Sulfate (NURTEC) 75 MG TBDP Take 1 tablet (75 mg total) by mouth as needed (take 1 tab at the earlist onset of a migraine. Max 1 tab in 24 hours). 10 tablet 5   topiramate (TOPAMAX) 50 MG tablet TAKE 3 TABLETS TWICE A DAY 180 tablet 11   traMADol (ULTRAM) 50 MG tablet Take 1 tablet (50 mg total) by mouth every 8 (eight) hours as needed for up to 5 days. 15 tablet 0   No current facility-administered medications for this visit.    Medication Side Effects: None  Allergies:  Allergies  Allergen Reactions   Bee Venom Anaphylaxis   Onion Other (See Comments)    Respiratory Distress   Tetanus-Diphtheria Toxoids Td Anaphylaxis   Sumatriptan     Passed out, nose bleed    Zolpidem Other (See Comments)    Causes sleep walking   Asa [Aspirin] Nausea And Vomiting   Iodine Rash   Nsaids Nausea Only    Rash, hives and trouble breathing   Sulfa Antibiotics Rash   Vancomycin Rash    Other Reaction(s): STEVENS-JOHNSON    Past Medical History:  Diagnosis Date   ACS (acute coronary syndrome) (HCC) 07/31/2021   Adhesive capsulitis of left shoulder 12/16/2016   Asthma without status asthmaticus 02/06/2021   At risk for falls 02/04/2019   Atrial fibrillation (HCC) 09/01/2014   Last  Assessment & Plan:  Formatting of this note might be different from the original. A:  Chronic.  Sinus rhythm at this time.  States she was told this during admission at [facility] in the past. P:  On baby aspirin at home will continue.  Low dose metoprolol started.   Bipolar 1 disorder, depressed, moderate (HCC) 02/18/2020   Capsulitis of left shoulder 10/20/2021   Chronic anticoagulation 02/20/2020   Chronic atrial fibrillation (HCC) 02/18/2020   Chronic headache 10/11/2013   Chronic interstitial cystitis 12/28/2005   Chronic migraine without aura, intractable, without status migrainosus 06/24/2017   Colon cancer screening 04/15/2016   Last Assessment & Plan:  Formatting of this note might be different from the original. Will schedule for colonoscopy   Depression, recurrent (HCC) 02/18/2020   Diabetes mellitus (HCC)    Dyslipidemia 02/20/2020   GAD (generalized anxiety disorder) 02/18/2020   GERD (gastroesophageal reflux disease) 11/05/2021   History of atrial fibrillation 06/24/2017   History of posttraumatic stress disorder (PTSD) 10/07/2016   Hyperosmolarity due to secondary diabetes mellitus (HCC) 02/06/2021   Impaired mobility and activities of daily living 11/14/2015   Irritable bowel syndrome (IBS)    Junctional tachycardia (HCC) 11/05/2021   Kidney stone 02/06/2021   Migraine without aura, not refractory 02/06/2021   Mixed hyperlipidemia 04/06/2021   Last Assessment & Plan:  Formatting of this note might be different from the original. 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 Controlled   Refractory migraine with aura 02/06/2021   Refractory migraine without  aura 02/06/2021   Renal colic on left side 04/22/2017   Stage 3 chronic kidney disease (HCC) 02/20/2020   Unspecified adverse effect of other drug, medicinal and biological substance(995.29) 02/17/2006  Urinary tract obstruction 02/06/2021    Family History  Adopted: Yes  Problem Relation Age of Onset   Migraines Daughter    Bipolar disorder Daughter    Migraines Daughter    Migraines Son    Bipolar disorder Son     Social History   Socioeconomic History   Marital status: Widowed    Spouse name: Not on file   Number of children: 6   Years of education: Not on file   Highest education level: Associate degree: academic program  Occupational History   Occupation: not employed  Tobacco Use   Smoking status: Never   Smokeless tobacco: Never   Tobacco comments:    Never smoke 07/15/22  Vaping Use   Vaping status: Never Used  Substance and Sexual Activity   Alcohol use: Never    Comment: light social   Drug use: Never   Sexual activity: Not Currently  Other Topics Concern   Not on file  Social History Narrative   Lives in [place] with daughter and son-in-law.  Sews clothing in free time as hobby.    Social Drivers of Corporate investment banker Strain: Low Risk  (10/11/2023)   Overall Financial Resource Strain (CARDIA)    Difficulty of Paying Living Expenses: Not very hard  Recent Concern: Financial Resource Strain - Medium Risk (07/24/2023)   Overall Financial Resource Strain (CARDIA)    Difficulty of Paying Living Expenses: Somewhat hard  Food Insecurity: No Food Insecurity (10/11/2023)   Hunger Vital Sign    Worried About Running Out of Food in the Last Year: Never true    Ran Out of Food in the Last Year: Never true  Transportation Needs: No Transportation Needs (10/11/2023)   PRAPARE - Administrator, Civil Service (Medical): No    Lack of Transportation (Non-Medical): No  Physical Activity: Inactive (10/11/2023)   Exercise Vital Sign    Days of  Exercise per Week: 0 days    Minutes of Exercise per Session: 0 min  Stress: Stress Concern Present (10/11/2023)   [employer] of Occupational Health - Occupational Stress Questionnaire    Feeling of Stress : To some extent  Social Connections: Socially Isolated (10/11/2023)   Social Connection and Isolation Panel [NHANES]    Frequency of Communication with Friends and Family: Three times a week    Frequency of Social Gatherings with Friends and Family: Never    Attends Religious Services: Never    Database administrator or Organizations: No    Attends Banker Meetings: Never    Marital Status: Widowed  Intimate Partner Violence: Not At Risk (09/18/2023)   Received from [facility]   Humiliation, Afraid, Rape, and Kick questionnaire    Fear of Current or Ex-Partner: No    Emotionally Abused: No    Physically Abused: No    Sexually Abused: No    Past Medical History, Surgical history, Social history, and Family history were reviewed and updated as appropriate.   Please see review of systems for further details on the patient's review from today.   Objective:   Physical Exam:  There were no vitals taken for this visit.  Physical Exam  Lab Review:     Component Value Date/Time   NA 136 07/26/2023 1941   NA 141 04/20/2023 1541   K 3.6 07/26/2023 1941   CL 109 07/26/2023 1941   CO2 16 (L) 07/26/2023 1941   GLUCOSE 103 (H) 07/26/2023 1941  BUN 22 07/26/2023 1941   BUN 14 04/20/2023 1541   CREATININE 2.05 (H) 07/26/2023 1941   CALCIUM 9.5 07/26/2023 1941   PROT 7.0 06/01/2023 1751   PROT 6.7 02/10/2021 0914   ALBUMIN 3.5 06/01/2023 1751   ALBUMIN 3.9 02/10/2021 0914   AST 20 06/01/2023 1751   ALT 14 06/01/2023 1751   ALKPHOS 51 06/01/2023 1751   BILITOT 0.5 06/01/2023 1751   BILITOT 0.3 02/10/2021 0914   GFRNONAA 26 (L) 07/26/2023 1941   GFRAA 39 (L) 04/22/2020 1726       Component Value Date/Time   WBC 7.6 07/26/2023 1941   RBC 4.02  07/26/2023 1941   HGB 12.2 07/26/2023 1941   HGB 12.4 04/20/2023 1537   HCT 37.6 07/26/2023 1941   HCT 38.3 04/20/2023 1537   PLT 300 07/26/2023 1941   PLT 324 04/20/2023 1537   MCV 93.5 07/26/2023 1941   MCV 92 04/20/2023 1537   MCH 30.3 07/26/2023 1941   MCHC 32.4 07/26/2023 1941   RDW 15.2 07/26/2023 1941   RDW 13.7 04/20/2023 1537   LYMPHSABS 2.2 07/10/2023 1948   LYMPHSABS 1.9 10/13/2022 1002   MONOABS 0.7 07/10/2023 1948   EOSABS 0.3 07/10/2023 1948   EOSABS 0.3 10/13/2022 1002   BASOSABS 0.1 07/10/2023 1948   BASOSABS 0.1 10/13/2022 1002    No results found for: "POCLITH", "LITHIUM"   No results found for: "PHENYTOIN", "PHENOBARB", "VALPROATE", "CBMZ"   .res Assessment: Plan:    Greater than 50% of 30 min video visit time with patient was spent on counseling and coordination of care. We discussed her recent health concerns as well as her medications. We discussed her recent falls and reviewed medications. She reports taking xanax very sparingly. She did have asymptomatic bladder infection. Also on Monjouro which can cause syncope and other issues. She has lost 50 lbs. Discussed her plans to got to rehab next week and will further address concerns.  She also is on high dose metoprolol. Also has history of A-Fib.  Advised patient to f/u and  clear any new medication with cardiologist.   We agreed today to:   Will continue Seroquel 300 mg at bedtime daily Will continue Latuda 120 mg daily To continue Wellbutrin 300 mg daily To start Lexapro 10 mg daily after breakfast  Will report worsening symptoms promptly Provided emergency contact information Discussed potential metabolic side effects associated with atypical antipsychotics, as well as potential risk for movement side effects. Advised pt to contact office if movement side effects occur.   Reviewed PDMP    Owen "Paula Stout" was seen today for anxiety, depression, follow-up, medication refill and patient  education.  Diagnoses and all orders for this visit:  Bipolar I disorder, most recent episode depressed (HCC) -     LORazepam (ATIVAN) 0.5 MG tablet; Take 1 tablet (0.5 mg total) by mouth every 8 (eight) hours. -     escitalopram (LEXAPRO) 10 MG tablet; Take 1 tablet (10 mg total) by mouth daily. -     Lurasidone HCl 120 MG TABS; Take 1 tablet (120 mg total) by mouth at bedtime.  Generalized anxiety disorder -     LORazepam (ATIVAN) 0.5 MG tablet; Take 1 tablet (0.5 mg total) by mouth every 8 (eight) hours. -     escitalopram (LEXAPRO) 10 MG tablet; Take 1 tablet (10 mg total) by mouth daily.  Depression, recurrent (HCC) -     buPROPion (WELLBUTRIN XL) 300 MG 24 hr tablet; Take 1 tablet (300 mg  total) by mouth daily.  Bipolar 1 disorder (HCC) -     QUEtiapine (SEROQUEL) 300 MG tablet; Take 1 tablet (300 mg total) by mouth at bedtime.     Please see After Visit Summary for patient specific instructions.  Future Appointments  Date Time Provider Department Center  10/20/2023  1:30 PM [facility] PFT ROOM [facility]  10/20/2023  3:30 PM Leslye Peer, MD L[facility]-PULCARE None  11/01/2023  9:00 AM [name], [facility] None  11/02/2023 10:00 AM [facility]SW CCM PHARMACIST [facility]SW PEC  11/03/2023  2:45 PM Jena Gauss, PT [facility]  11/10/2023  3:00 PM [name], [facility]-MK[facility] None  11/14/2023  2:45 PM Elberta Fortis, Andree Coss, MD CVD-HIGHPT None  11/16/2023  2:00 PM [name], [facility] None  11/23/2023  2:00 PM [name], [facility]-MK[facility] None  11/28/2023 10:45 AM Sharlene Dory, DO [facility]-SW PEC  12/01/2023  3:00 PM [name], [facility] L[facility]-MK[facility] None  12/13/2023  4:00 PM Georgeanna Lea, MD CVD-HIGHPT None  01/17/2024 10:50 AM Drema Dallas, DO LBN-LBNG None    No orders of the defined types were placed in this encounter.     -------------------------------

## 2023-10-20 ENCOUNTER — Encounter: Payer: Self-pay | Admitting: Family Medicine

## 2023-10-20 ENCOUNTER — Ambulatory Visit: Payer: Medicare (Managed Care) | Admitting: Emergency Medicine

## 2023-10-20 ENCOUNTER — Encounter: Payer: Self-pay | Admitting: Emergency Medicine

## 2023-10-20 ENCOUNTER — Ambulatory Visit (HOSPITAL_BASED_OUTPATIENT_CLINIC_OR_DEPARTMENT_OTHER): Payer: Medicare (Managed Care) | Admitting: Emergency Medicine

## 2023-10-20 VITALS — BP 102/64 | HR 66 | Ht 66.0 in | Wt 181.2 lb

## 2023-10-20 DIAGNOSIS — J4520 Mild intermittent asthma, uncomplicated: Secondary | ICD-10-CM | POA: Diagnosis not present

## 2023-10-20 DIAGNOSIS — R0609 Other forms of dyspnea: Secondary | ICD-10-CM

## 2023-10-20 DIAGNOSIS — N183 Chronic kidney disease, stage 3 unspecified: Secondary | ICD-10-CM | POA: Diagnosis not present

## 2023-10-20 DIAGNOSIS — N390 Urinary tract infection, site not specified: Secondary | ICD-10-CM | POA: Diagnosis not present

## 2023-10-20 DIAGNOSIS — R911 Solitary pulmonary nodule: Secondary | ICD-10-CM | POA: Diagnosis not present

## 2023-10-20 DIAGNOSIS — J4530 Mild persistent asthma, uncomplicated: Secondary | ICD-10-CM

## 2023-10-20 LAB — PULMONARY FUNCTION TEST
DL/VA % pred: 96 %
DL/VA: 3.95 ml/min/mmHg/L
DLCO cor % pred: 90 %
DLCO cor: 19.08 ml/min/mmHg
DLCO unc % pred: 78 %
DLCO unc: 16.49 ml/min/mmHg
FEF 25-75 Post: 2.06 L/s
FEF 25-75 Pre: 1.93 L/s
FEF2575-%Change-Post: 6 %
FEF2575-%Pred-Post: 93 %
FEF2575-%Pred-Pre: 87 %
FEV1-%Change-Post: 2 %
FEV1-%Pred-Post: 91 %
FEV1-%Pred-Pre: 89 %
FEV1-Post: 2.36 L
FEV1-Pre: 2.3 L
FEV1FVC-%Change-Post: -2 %
FEV1FVC-%Pred-Pre: 95 %
FEV6-%Change-Post: 5 %
FEV6-%Pred-Post: 101 %
FEV6-%Pred-Pre: 96 %
FEV6-Post: 3.28 L
FEV6-Pre: 3.12 L
FEV6FVC-%Change-Post: 0 %
FEV6FVC-%Pred-Post: 104 %
FEV6FVC-%Pred-Pre: 104 %
FVC-%Change-Post: 5 %
FVC-%Pred-Post: 97 %
FVC-%Pred-Pre: 92 %
FVC-Post: 3.28 L
FVC-Pre: 3.12 L
Post FEV1/FVC ratio: 72 %
Post FEV6/FVC ratio: 100 %
Pre FEV1/FVC ratio: 74 %
Pre FEV6/FVC Ratio: 100 %
RV % pred: 100 %
RV: 2.21 L
TLC % pred: 104 %
TLC: 5.6 L

## 2023-10-20 NOTE — Patient Instructions (Signed)
We reviewed your CT scan of the chest today We will perform a PET scan We will arrange for navigational bronchoscopy to evaluate a small right upper lobe pulmonary nodule.  This will be done under general anesthesia as an outpatient at [facility] endoscopy.  You will need a designated driver and someone to watch you at home that day after the procedure.  You will need to stop your Eliquis 2 days prior. Follow-up with APP in our office the week after your procedure so we can discuss results and next steps

## 2023-10-20 NOTE — H&P (View-Only) (Signed)
 Subjective:    Patient ID: Paula Stout, female    DOB: 02-04-1956, 68 y.o.   MRN: [id number]  HPI  ROV 10/20/2023 --Paula Stout is 82, never smoker with a history of coronary disease, chronic atrial fibrillation, migraines, asthma, GERD and rhinitis, diabetes, CKD.  I saw her for an abnormal CT scan of the chest in October that showed a focal nodular density with irregular borders in the superior segment of the right lower lobe 1.3 x 1.6 cm.  It was slightly less prominent on repeat CT done 07/27/2023 and we decided to follow with serial imaging.  CT scan of the chest 10/17/2023 reviewed by me shows overall stable appearance of the irregular superior segmental right lower lobe nodule.  Looks quite similar to the scan done in September 2024  Pulmonary function testing 10/20/2023 reviewed by me shows grossly normal airflows without a bronchodilator response.  Normal lung volumes decreased diffusion capacity that corrects to the normal range when adjusted for alveolar volume   Review of Systems As per HPI  Past Medical History:  Diagnosis Date   ACS (acute coronary syndrome) (HCC) 07/31/2021   Adhesive capsulitis of left shoulder 12/16/2016   Asthma without status asthmaticus 02/06/2021   At risk for falls 02/04/2019   Atrial fibrillation (HCC) 09/01/2014   Last Assessment & Plan:  Formatting of this note might be different from the original. A:  Chronic.  Sinus rhythm at this time.  States she was told this during admission at [facility] in the past. P:  On baby aspirin at home will continue.  Low dose metoprolol started.   Bipolar 1 disorder, depressed, moderate (HCC) 02/18/2020   Capsulitis of left shoulder 10/20/2021   Chronic anticoagulation 02/20/2020   Chronic atrial fibrillation (HCC) 02/18/2020   Chronic headache 10/11/2013   Chronic interstitial cystitis 12/28/2005   Chronic migraine without aura, intractable, without status migrainosus 06/24/2017   Colon cancer screening 04/15/2016    Last Assessment & Plan:  Formatting of this note might be different from the original. Will schedule for colonoscopy   Depression, recurrent (HCC) 02/18/2020   Diabetes mellitus (HCC)    Dyslipidemia 02/20/2020   GAD (generalized anxiety disorder) 02/18/2020   GERD (gastroesophageal reflux disease) 11/05/2021   History of atrial fibrillation 06/24/2017   History of posttraumatic stress disorder (PTSD) 10/07/2016   Hyperosmolarity due to secondary diabetes mellitus (HCC) 02/06/2021   Impaired mobility and activities of daily living 11/14/2015   Irritable bowel syndrome (IBS)    Junctional tachycardia (HCC) 11/05/2021   Kidney stone 02/06/2021   Migraine without aura, not refractory 02/06/2021   Mixed hyperlipidemia 04/06/2021   Last Assessment & Plan:  Formatting of this note might be different from the original. 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 Controlled   Refractory migraine with aura 02/06/2021   Refractory migraine without aura 02/06/2021   Renal colic on left side 04/22/2017   Stage 3 chronic kidney disease (HCC) 02/20/2020   Unspecified adverse effect of other drug, medicinal and biological substance(995.29) 02/17/2006   Urinary tract obstruction 02/06/2021     Family History  Adopted: Yes  Problem Relation Age of Onset   Migraines Daughter    Bipolar  disorder Daughter    Migraines Daughter    Migraines Son    Bipolar disorder Son     Adopted, so unclear family hx 2nd hand smoke exposure She has been a Conservator, museum/gallery Has lived in [place], [place] state., has been in [place] for 4 years.  No hot tub Owns dogs, cats. Has owned birds before, 20 yrs  ago.    Social History   Socioeconomic History   Marital status: Widowed    Spouse name: Not on file   Number of children: 6   Years of education: Not on file   Highest education level: Associate degree: academic program  Occupational History   Occupation: not employed  Tobacco Use   Smoking status: Never   Smokeless tobacco: Never   Tobacco comments:    Never smoke 07/15/22  Vaping Use   Vaping status: Never Used  Substance and Sexual Activity   Alcohol use: Never    Comment: light social   Drug use: Never   Sexual activity: Not Currently  Other Topics Concern   Not on file  Social History Narrative   Lives in [place] with daughter and son-in-law.  Sews clothing in free time as hobby.    Social Drivers of Corporate investment banker Strain: Low Risk  (10/11/2023)   Overall Financial Resource Strain (CARDIA)    Difficulty of Paying Living Expenses: Not very hard  Recent Concern: Financial Resource Strain - Medium Risk (07/24/2023)   Overall Financial Resource Strain (CARDIA)    Difficulty of Paying Living Expenses: Somewhat hard  Food Insecurity: No Food Insecurity (10/11/2023)   Hunger Vital Sign    Worried About Running Out of Food in the Last Year: Never true    Ran Out of Food in the Last Year: Never true  Transportation Needs: No Transportation Needs (10/11/2023)   PRAPARE - Administrator, Civil Service (Medical): No    Lack of Transportation (Non-Medical): No  Physical Activity: Inactive (10/11/2023)   Exercise Vital Sign    Days of Exercise per Week: 0 days    Minutes of Exercise per Session: 0 min  Stress: Stress Concern Present (10/11/2023)   [employer] of Occupational Health - Occupational Stress Questionnaire    Feeling of Stress : To some extent  Social Connections: Socially Isolated (10/11/2023)   Social Connection and Isolation Panel [NHANES]    Frequency of Communication with Friends and Family: Three times a week    Frequency of Social  Gatherings with Friends and Family: Never    Attends Religious Services: Never    Database administrator or Organizations: No    Attends Banker Meetings: Never    Marital Status: Widowed  Intimate Partner Violence: Not At Risk (09/18/2023)   Received from [facility]   Humiliation, Afraid, Rape, and Kick questionnaire    Fear of Current or Ex-Partner: No    Emotionally Abused: No    Physically Abused: No    Sexually Abused: No     Allergies  Allergen Reactions   Bee Venom Anaphylaxis   Onion Other (See Comments)    Respiratory Distress   Tetanus-Diphtheria Toxoids Td Anaphylaxis   Sumatriptan     Passed out, nose bleed    Zolpidem Other (See Comments)    Causes sleep walking   Asa [Aspirin] Nausea And Vomiting   Iodine Rash   Nsaids Nausea Only    Rash, hives and trouble breathing   Sulfa Antibiotics  Rash   Vancomycin Rash    Other Reaction(s): STEVENS-JOHNSON     Outpatient Medications Prior to Visit  Medication Sig Dispense Refill   acetaminophen (TYLENOL) 500 MG tablet Take 1,000 mg by mouth every 6 (six) hours as needed for mild pain.     atorvastatin (LIPITOR) 80 MG tablet TAKE 1 TABLET DAILY 90 tablet 3   blood glucose meter kit and supplies One Touch Ultra  Check 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 One Touch Ultra  Check blood sugars once daily Dx: E11.9) 1 each 0   botulinum toxin Type A (BOTOX) 200 units injection Inject 155 units IM into multiple site in the face,neck and head once every 90 days (Patient taking differently: Inject 155 Units into the muscle See admin instructions. Inject 155 units IM into multiple site in the face,neck and head once every 90 days) 1 each 4   budesonide-formoterol (SYMBICORT) 80-4.5 MCG/ACT inhaler Inhale 2 puffs into the lungs 2 (two) times daily. 20.4 g 3   buPROPion (WELLBUTRIN XL) 300 MG 24 hr tablet Take 1 tablet (300 mg total) by  mouth daily. 90 tablet 1   diltiazem (CARDIZEM CD) 120 MG 24 hr capsule Take 1 capsule (120 mg total) by mouth daily. 90 capsule 3   diltiazem (CARDIZEM) 30 MG tablet Take 1 tablet every 4 hours AS NEEDED for AFIB or SVT heart rate >95 as long as top BP >100. (Patient taking differently: Take 30 mg by mouth every 4 (four) hours as needed (for AFIB or SVT heart rate >95 as long as top BP >100.). Take 1 tablet every 4 hours AS NEEDED for AFIB or SVT heart rate >95 as long as top BP >100.) 30 tablet 1   ELIQUIS 5 MG TABS tablet TAKE 1 TABLET TWICE A DAY 180 tablet 3   EPINEPHrine 0.3 mg/0.3 mL IJ SOAJ injection Inject 0.3 mg into the muscle as needed for anaphylaxis. 1 each 1   escitalopram (LEXAPRO) 10 MG tablet Take 1 tablet (10 mg total) by mouth daily. 90 tablet 1   ezetimibe (ZETIA) 10 MG tablet TAKE 1 TABLET DAILY 90 tablet 3   FARXIGA 10 MG TABS tablet TAKE 1 TABLET DAILY 90 tablet 3   fenofibrate 160 MG tablet Take 1 tablet (160 mg total) by mouth daily. 90 tablet 1   fluconazole (DIFLUCAN) 150 MG tablet Take 1 tab, repeat in 72 hours if no improvement. 2 tablet 0   fluticasone (FLONASE) 50 MCG/ACT nasal spray Place 2 sprays into both nostrils daily. (Patient taking differently: Place 2 sprays into both nostrils 3 (three) times a week.) 16 g 6   Fremanezumab-vfrm (AJOVY) 225 MG/1.5ML SOAJ Inject 225 mg into the skin every 28 (twenty-eight) days. 1.68 mL 11   levalbuterol (XOPENEX HFA) 45 MCG/ACT inhaler Inhale 2 puffs into the lungs every 6 (six) hours as needed for wheezing or shortness of breath. 15 g 2   levocetirizine (XYZAL) 5 MG tablet TAKE 1 TABLET EVERY EVENING (Patient taking differently: Take 5 mg by mouth every evening.) 90 tablet 3   lidocaine (LIDODERM) 5 % Place 1 patch onto the skin daily. Remove & Discard patch within 12 hours or as directed by MD 30 patch 0   LORazepam (ATIVAN) 0.5 MG tablet Take 1 tablet (0.5 mg total) by mouth every 8 (eight) hours. 30 tablet 1   Lurasidone  HCl 120 MG TABS Take 1 tablet (120 mg total)  by mouth at bedtime. 90 tablet 3   magnesium oxide (MAG-OX) 400 (240 Mg) MG tablet Take 400 mg by mouth daily.     metoCLOPramide (REGLAN) 10 MG tablet Take 1 tablet (10 mg total) by mouth every 6 (six) hours. 15 tablet 0   metoprolol succinate (TOPROL-XL) 100 MG 24 hr tablet Take 1 tablet (100 mg total) by mouth 2 (two) times daily. Take with or immediately following a meal. 180 tablet 3   metoprolol tartrate (LOPRESSOR) 25 MG tablet Take 1 tablet (25 mg total) by mouth every 4 (four) hours as needed (PALPITATIONS). 90 tablet 3   MOUNJARO 12.5 MG/0.5ML Pen INJECT 12.5 MG UNDER THE SKIN WEEKLY 2 mL 12   Multiple Vitamins-Minerals (MULTIVITAMIN WITH MINERALS) tablet Take 1 tablet by mouth daily. Daily Diabetes Health Pack - MVI + chromium, fish oil + vit D, magnesium, vit C, alpha lipoic acid w/ green tea     nitroGLYCERIN (NITROSTAT) 0.4 MG SL tablet Place 1 tablet (0.4 mg total) under the tongue every 5 (five) minutes as needed for chest pain. 25 tablet 11   ondansetron (ZOFRAN-ODT) 4 MG disintegrating tablet Take 1 tablet (4 mg total) by mouth every 8 (eight) hours as needed for nausea or vomiting. 30 tablet 0   ONETOUCH VERIO test strip USE DAILY TO CHECK BLOOD SUGAR 100 strip 3   pantoprazole (PROTONIX) 40 MG tablet Take 1 tablet (40 mg total) by mouth daily. 90 tablet 3   QUEtiapine (SEROQUEL) 300 MG tablet Take 1 tablet (300 mg total) by mouth at bedtime. 90 tablet 3   Rimegepant Sulfate (NURTEC) 75 MG TBDP Take 1 tablet (75 mg total) by mouth as needed (take 1 tab at the earlist onset of a migraine. Max 1 tab in 24 hours). 10 tablet 5   topiramate (TOPAMAX) 50 MG tablet TAKE 3 TABLETS TWICE A DAY 180 tablet 11   traMADol (ULTRAM) 50 MG tablet Take 1 tablet (50 mg total) by mouth every 8 (eight) hours as needed for up to 5 days. 15 tablet 0   No facility-administered medications prior to visit.   Is thinning and uploading if is still applying      Objective:   Physical Exam  Vitals:   10/20/23 1508  BP: 102/64  Pulse: 66  SpO2: 98%  Weight: 181 lb 3.2 oz (82.2 kg)  Height: 5\' 6"  (1.676 m)   Gen: Pleasant, well-nourished, in no distress,  normal affect  ENT: No lesions,  mouth clear,  oropharynx clear, no postnasal drip  Neck: No JVD, no stridor  Lungs: No use of accessory muscles, no crackles or wheezing on normal respiration, no wheeze on forced expiration  Cardiovascular: RRR, heart sounds normal, no murmur or gallops, no peripheral edema  Musculoskeletal: No deformities, no cyanosis or clubbing  Neuro: alert, awake, non focal  Skin: Warm, no lesions or rash     Assessment & Plan:   Pulmonary nodule 1 cm or greater in diameter Irregular right upper lobe nodular opacity that is overall unchanged going back to September.  It does have a concerning appearance for possible malignancy.  We talked about the options for follow-up including watchful waiting with serial CTs, PET scan, bronchoscopy, possible referral to surgery for resection.  We will plan to schedule navigational bronchoscopy to further evaluate.  Would still consider surgical referral if malignancy is present as she does have acceptable PFT.  Will also check a PET scan now.  Asthma without status asthmaticus Reassuring pulmonary function  testing.  Currently on Symbicort.  Could consider scaling back in the future depending on clinical trend    Levy Pupa, MD, PhD 10/20/2023, 4:05 PM [facility] [phone] or if no answer before 7:00PM call [phone] For any issues after 7:00PM please call eLink [phone]

## 2023-10-20 NOTE — Assessment & Plan Note (Signed)
Irregular right upper lobe nodular opacity that is overall unchanged going back to September.  It does have a concerning appearance for possible malignancy.  We talked about the options for follow-up including watchful waiting with serial CTs, PET scan, bronchoscopy, possible referral to surgery for resection.  We will plan to schedule navigational bronchoscopy to further evaluate.  Would still consider surgical referral if malignancy is present as she does have acceptable PFT.  Will also check a PET scan now.

## 2023-10-20 NOTE — Progress Notes (Signed)
Full PFT Performed Today

## 2023-10-20 NOTE — Assessment & Plan Note (Signed)
Reassuring pulmonary function testing.  Currently on Symbicort.  Could consider scaling back in the future depending on clinical trend

## 2023-10-20 NOTE — Progress Notes (Signed)
Subjective:    Patient ID: Paula Stout, female    DOB: 02-04-1956, 68 y.o.   MRN: [id number]  HPI  ROV 10/20/2023 --Paula Stout is 82, never smoker with a history of coronary disease, chronic atrial fibrillation, migraines, asthma, GERD and rhinitis, diabetes, CKD.  I saw her for an abnormal CT scan of the chest in October that showed a focal nodular density with irregular borders in the superior segment of the right lower lobe 1.3 x 1.6 cm.  It was slightly less prominent on repeat CT done 07/27/2023 and we decided to follow with serial imaging.  CT scan of the chest 10/17/2023 reviewed by me shows overall stable appearance of the irregular superior segmental right lower lobe nodule.  Looks quite similar to the scan done in September 2024  Pulmonary function testing 10/20/2023 reviewed by me shows grossly normal airflows without a bronchodilator response.  Normal lung volumes decreased diffusion capacity that corrects to the normal range when adjusted for alveolar volume   Review of Systems As per HPI  Past Medical History:  Diagnosis Date   ACS (acute coronary syndrome) (HCC) 07/31/2021   Adhesive capsulitis of left shoulder 12/16/2016   Asthma without status asthmaticus 02/06/2021   At risk for falls 02/04/2019   Atrial fibrillation (HCC) 09/01/2014   Last Assessment & Plan:  Formatting of this note might be different from the original. A:  Chronic.  Sinus rhythm at this time.  States she was told this during admission at [facility] in the past. P:  On baby aspirin at home will continue.  Low dose metoprolol started.   Bipolar 1 disorder, depressed, moderate (HCC) 02/18/2020   Capsulitis of left shoulder 10/20/2021   Chronic anticoagulation 02/20/2020   Chronic atrial fibrillation (HCC) 02/18/2020   Chronic headache 10/11/2013   Chronic interstitial cystitis 12/28/2005   Chronic migraine without aura, intractable, without status migrainosus 06/24/2017   Colon cancer screening 04/15/2016    Last Assessment & Plan:  Formatting of this note might be different from the original. Will schedule for colonoscopy   Depression, recurrent (HCC) 02/18/2020   Diabetes mellitus (HCC)    Dyslipidemia 02/20/2020   GAD (generalized anxiety disorder) 02/18/2020   GERD (gastroesophageal reflux disease) 11/05/2021   History of atrial fibrillation 06/24/2017   History of posttraumatic stress disorder (PTSD) 10/07/2016   Hyperosmolarity due to secondary diabetes mellitus (HCC) 02/06/2021   Impaired mobility and activities of daily living 11/14/2015   Irritable bowel syndrome (IBS)    Junctional tachycardia (HCC) 11/05/2021   Kidney stone 02/06/2021   Migraine without aura, not refractory 02/06/2021   Mixed hyperlipidemia 04/06/2021   Last Assessment & Plan:  Formatting of this note might be different from the original. 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 Controlled   Refractory migraine with aura 02/06/2021   Refractory migraine without aura 02/06/2021   Renal colic on left side 04/22/2017   Stage 3 chronic kidney disease (HCC) 02/20/2020   Unspecified adverse effect of other drug, medicinal and biological substance(995.29) 02/17/2006   Urinary tract obstruction 02/06/2021     Family History  Adopted: Yes  Problem Relation Age of Onset   Migraines Daughter    Bipolar  disorder Daughter    Migraines Daughter    Migraines Son    Bipolar disorder Son     Adopted, so unclear family hx 2nd hand smoke exposure She has been a Conservator, museum/gallery Has lived in [place], [place] state., has been in [place] for 4 years.  No hot tub Owns dogs, cats. Has owned birds before, 20 yrs  ago.    Social History   Socioeconomic History   Marital status: Widowed    Spouse name: Not on file   Number of children: 6   Years of education: Not on file   Highest education level: Associate degree: academic program  Occupational History   Occupation: not employed  Tobacco Use   Smoking status: Never   Smokeless tobacco: Never   Tobacco comments:    Never smoke 07/15/22  Vaping Use   Vaping status: Never Used  Substance and Sexual Activity   Alcohol use: Never    Comment: light social   Drug use: Never   Sexual activity: Not Currently  Other Topics Concern   Not on file  Social History Narrative   Lives in [place] with daughter and son-in-law.  Sews clothing in free time as hobby.    Social Drivers of Corporate investment banker Strain: Low Risk  (10/11/2023)   Overall Financial Resource Strain (CARDIA)    Difficulty of Paying Living Expenses: Not very hard  Recent Concern: Financial Resource Strain - Medium Risk (07/24/2023)   Overall Financial Resource Strain (CARDIA)    Difficulty of Paying Living Expenses: Somewhat hard  Food Insecurity: No Food Insecurity (10/11/2023)   Hunger Vital Sign    Worried About Running Out of Food in the Last Year: Never true    Ran Out of Food in the Last Year: Never true  Transportation Needs: No Transportation Needs (10/11/2023)   PRAPARE - Administrator, Civil Service (Medical): No    Lack of Transportation (Non-Medical): No  Physical Activity: Inactive (10/11/2023)   Exercise Vital Sign    Days of Exercise per Week: 0 days    Minutes of Exercise per Session: 0 min  Stress: Stress Concern Present (10/11/2023)   [employer] of Occupational Health - Occupational Stress Questionnaire    Feeling of Stress : To some extent  Social Connections: Socially Isolated (10/11/2023)   Social Connection and Isolation Panel [NHANES]    Frequency of Communication with Friends and Family: Three times a week    Frequency of Social  Gatherings with Friends and Family: Never    Attends Religious Services: Never    Database administrator or Organizations: No    Attends Banker Meetings: Never    Marital Status: Widowed  Intimate Partner Violence: Not At Risk (09/18/2023)   Received from [facility]   Humiliation, Afraid, Rape, and Kick questionnaire    Fear of Current or Ex-Partner: No    Emotionally Abused: No    Physically Abused: No    Sexually Abused: No     Allergies  Allergen Reactions   Bee Venom Anaphylaxis   Onion Other (See Comments)    Respiratory Distress   Tetanus-Diphtheria Toxoids Td Anaphylaxis   Sumatriptan     Passed out, nose bleed    Zolpidem Other (See Comments)    Causes sleep walking   Asa [Aspirin] Nausea And Vomiting   Iodine Rash   Nsaids Nausea Only    Rash, hives and trouble breathing   Sulfa Antibiotics  Rash   Vancomycin Rash    Other Reaction(s): STEVENS-JOHNSON     Outpatient Medications Prior to Visit  Medication Sig Dispense Refill   acetaminophen (TYLENOL) 500 MG tablet Take 1,000 mg by mouth every 6 (six) hours as needed for mild pain.     atorvastatin (LIPITOR) 80 MG tablet TAKE 1 TABLET DAILY 90 tablet 3   blood glucose meter kit and supplies One Touch Ultra  Check 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 One Touch Ultra  Check blood sugars once daily Dx: E11.9) 1 each 0   botulinum toxin Type A (BOTOX) 200 units injection Inject 155 units IM into multiple site in the face,neck and head once every 90 days (Patient taking differently: Inject 155 Units into the muscle See admin instructions. Inject 155 units IM into multiple site in the face,neck and head once every 90 days) 1 each 4   budesonide-formoterol (SYMBICORT) 80-4.5 MCG/ACT inhaler Inhale 2 puffs into the lungs 2 (two) times daily. 20.4 g 3   buPROPion (WELLBUTRIN XL) 300 MG 24 hr tablet Take 1 tablet (300 mg total) by  mouth daily. 90 tablet 1   diltiazem (CARDIZEM CD) 120 MG 24 hr capsule Take 1 capsule (120 mg total) by mouth daily. 90 capsule 3   diltiazem (CARDIZEM) 30 MG tablet Take 1 tablet every 4 hours AS NEEDED for AFIB or SVT heart rate >95 as long as top BP >100. (Patient taking differently: Take 30 mg by mouth every 4 (four) hours as needed (for AFIB or SVT heart rate >95 as long as top BP >100.). Take 1 tablet every 4 hours AS NEEDED for AFIB or SVT heart rate >95 as long as top BP >100.) 30 tablet 1   ELIQUIS 5 MG TABS tablet TAKE 1 TABLET TWICE A DAY 180 tablet 3   EPINEPHrine 0.3 mg/0.3 mL IJ SOAJ injection Inject 0.3 mg into the muscle as needed for anaphylaxis. 1 each 1   escitalopram (LEXAPRO) 10 MG tablet Take 1 tablet (10 mg total) by mouth daily. 90 tablet 1   ezetimibe (ZETIA) 10 MG tablet TAKE 1 TABLET DAILY 90 tablet 3   FARXIGA 10 MG TABS tablet TAKE 1 TABLET DAILY 90 tablet 3   fenofibrate 160 MG tablet Take 1 tablet (160 mg total) by mouth daily. 90 tablet 1   fluconazole (DIFLUCAN) 150 MG tablet Take 1 tab, repeat in 72 hours if no improvement. 2 tablet 0   fluticasone (FLONASE) 50 MCG/ACT nasal spray Place 2 sprays into both nostrils daily. (Patient taking differently: Place 2 sprays into both nostrils 3 (three) times a week.) 16 g 6   Fremanezumab-vfrm (AJOVY) 225 MG/1.5ML SOAJ Inject 225 mg into the skin every 28 (twenty-eight) days. 1.68 mL 11   levalbuterol (XOPENEX HFA) 45 MCG/ACT inhaler Inhale 2 puffs into the lungs every 6 (six) hours as needed for wheezing or shortness of breath. 15 g 2   levocetirizine (XYZAL) 5 MG tablet TAKE 1 TABLET EVERY EVENING (Patient taking differently: Take 5 mg by mouth every evening.) 90 tablet 3   lidocaine (LIDODERM) 5 % Place 1 patch onto the skin daily. Remove & Discard patch within 12 hours or as directed by MD 30 patch 0   LORazepam (ATIVAN) 0.5 MG tablet Take 1 tablet (0.5 mg total) by mouth every 8 (eight) hours. 30 tablet 1   Lurasidone  HCl 120 MG TABS Take 1 tablet (120 mg total)  by mouth at bedtime. 90 tablet 3   magnesium oxide (MAG-OX) 400 (240 Mg) MG tablet Take 400 mg by mouth daily.     metoCLOPramide (REGLAN) 10 MG tablet Take 1 tablet (10 mg total) by mouth every 6 (six) hours. 15 tablet 0   metoprolol succinate (TOPROL-XL) 100 MG 24 hr tablet Take 1 tablet (100 mg total) by mouth 2 (two) times daily. Take with or immediately following a meal. 180 tablet 3   metoprolol tartrate (LOPRESSOR) 25 MG tablet Take 1 tablet (25 mg total) by mouth every 4 (four) hours as needed (PALPITATIONS). 90 tablet 3   MOUNJARO 12.5 MG/0.5ML Pen INJECT 12.5 MG UNDER THE SKIN WEEKLY 2 mL 12   Multiple Vitamins-Minerals (MULTIVITAMIN WITH MINERALS) tablet Take 1 tablet by mouth daily. Daily Diabetes Health Pack - MVI + chromium, fish oil + vit D, magnesium, vit C, alpha lipoic acid w/ green tea     nitroGLYCERIN (NITROSTAT) 0.4 MG SL tablet Place 1 tablet (0.4 mg total) under the tongue every 5 (five) minutes as needed for chest pain. 25 tablet 11   ondansetron (ZOFRAN-ODT) 4 MG disintegrating tablet Take 1 tablet (4 mg total) by mouth every 8 (eight) hours as needed for nausea or vomiting. 30 tablet 0   ONETOUCH VERIO test strip USE DAILY TO CHECK BLOOD SUGAR 100 strip 3   pantoprazole (PROTONIX) 40 MG tablet Take 1 tablet (40 mg total) by mouth daily. 90 tablet 3   QUEtiapine (SEROQUEL) 300 MG tablet Take 1 tablet (300 mg total) by mouth at bedtime. 90 tablet 3   Rimegepant Sulfate (NURTEC) 75 MG TBDP Take 1 tablet (75 mg total) by mouth as needed (take 1 tab at the earlist onset of a migraine. Max 1 tab in 24 hours). 10 tablet 5   topiramate (TOPAMAX) 50 MG tablet TAKE 3 TABLETS TWICE A DAY 180 tablet 11   traMADol (ULTRAM) 50 MG tablet Take 1 tablet (50 mg total) by mouth every 8 (eight) hours as needed for up to 5 days. 15 tablet 0   No facility-administered medications prior to visit.   Is thinning and uploading if is still applying      Objective:   Physical Exam  Vitals:   10/20/23 1508  BP: 102/64  Pulse: 66  SpO2: 98%  Weight: 181 lb 3.2 oz (82.2 kg)  Height: 5\' 6"  (1.676 m)   Gen: Pleasant, well-nourished, in no distress,  normal affect  ENT: No lesions,  mouth clear,  oropharynx clear, no postnasal drip  Neck: No JVD, no stridor  Lungs: No use of accessory muscles, no crackles or wheezing on normal respiration, no wheeze on forced expiration  Cardiovascular: RRR, heart sounds normal, no murmur or gallops, no peripheral edema  Musculoskeletal: No deformities, no cyanosis or clubbing  Neuro: alert, awake, non focal  Skin: Warm, no lesions or rash     Assessment & Plan:   Pulmonary nodule 1 cm or greater in diameter Irregular right upper lobe nodular opacity that is overall unchanged going back to September.  It does have a concerning appearance for possible malignancy.  We talked about the options for follow-up including watchful waiting with serial CTs, PET scan, bronchoscopy, possible referral to surgery for resection.  We will plan to schedule navigational bronchoscopy to further evaluate.  Would still consider surgical referral if malignancy is present as she does have acceptable PFT.  Will also check a PET scan now.  Asthma without status asthmaticus Reassuring pulmonary function  testing.  Currently on Symbicort.  Could consider scaling back in the future depending on clinical trend    Levy Pupa, MD, PhD 10/20/2023, 4:05 PM [facility] [phone] or if no answer before 7:00PM call [phone] For any issues after 7:00PM please call eLink [phone]

## 2023-10-20 NOTE — Patient Instructions (Signed)
Full PFT Performed Today

## 2023-10-20 NOTE — Transitions of Care (Post Inpatient/ED Visit) (Signed)
   10/20/2023  Name: Paula Stout MRN: [id number] DOB: [birth year]  Today's TOC FU Call Status: Today's TOC FU Call Status:: Unsuccessful Call (1st Attempt) Unsuccessful Call (1st Attempt) Date: 10/18/23  Attempted to reach the patient regarding the most recent Inpatient/ED visit.  Follow Up Plan: No further outreach attempts will be made at this time. We have been unable to contact the patient. Patient already seen in office Signature Karena Addison, LPN [facility] Nurse Health Advisor Direct Dial [phone]

## 2023-10-21 LAB — LAB REPORT - SCANNED
Calcium: 9.6
Creatinine, POC: 178.6 mg/dL
EGFR: 30
PTH: 36

## 2023-10-24 ENCOUNTER — Telehealth: Payer: Self-pay | Admitting: Behavioral Health

## 2023-10-24 NOTE — Telephone Encounter (Signed)
[facility] lvm stating that there is a drug interaction with the lorazapam that Paula Stout sent in and tramadol that pt receives from a different doctor and pharmacy. The pharmacy needs to know is it ok to fill the lorazapam. Please call [phone]. Ref# [id number].

## 2023-10-24 NOTE — Telephone Encounter (Signed)
Pt said she will only be on Tramadol a couple more days. I asked her to let us know when she has completed the Tramadol and then we could RF Xanax. She fell down some stairs and sprained her ankle and cracked her tailbone.

## 2023-10-24 NOTE — Telephone Encounter (Signed)
Please see message from pharmacy and advise.  10/19/2023 10/17/2023 1  Tramadol Hcl 50 Mg Tablet 15.00 5 Ni Wen [id number] Pub [phone]) 0/0 30.00 MME Comm Ins [facility] 10/14/2023 10/14/2023 1  Tramadol Hcl 50 Mg Tablet 15.00 5 Pa Web [id number] Pub (6213) 0/0 30.00 MME Comm Ins [place] [facility] 07/20/2023 07/18/2023 1  Lorazepam 0.5 Mg Tablet 30.00 10 Br [facility] [id number] Exp (4317) 0/1 1.50 LME Medicare [place] 06/29/2023 06/29/2023 1  Lorazepam 0.5 Mg Tablet 30.00 10 Br Whi

## 2023-10-28 ENCOUNTER — Other Ambulatory Visit: Payer: Self-pay

## 2023-10-28 ENCOUNTER — Emergency Department (HOSPITAL_BASED_OUTPATIENT_CLINIC_OR_DEPARTMENT_OTHER)
Admission: EM | Admit: 2023-10-28 | Discharge: 2023-10-29 | Disposition: A | Discharge status: Home / Self Care | Payer: Medicare (Managed Care) | Attending: Emergency Medicine | Admitting: Emergency Medicine

## 2023-10-28 ENCOUNTER — Ambulatory Visit (HOSPITAL_COMMUNITY)
Admission: RE | Admit: 2023-10-28 | Discharge: 2023-10-28 | Disposition: A | Discharge status: Home / Self Care | Payer: Medicare (Managed Care) | Source: Ambulatory Visit | Attending: Emergency Medicine | Admitting: Emergency Medicine

## 2023-10-28 DIAGNOSIS — R918 Other nonspecific abnormal finding of lung field: Secondary | ICD-10-CM | POA: Insufficient documentation

## 2023-10-28 DIAGNOSIS — I728 Aneurysm of other specified arteries: Secondary | ICD-10-CM | POA: Insufficient documentation

## 2023-10-28 DIAGNOSIS — Z7901 Long term (current) use of anticoagulants: Secondary | ICD-10-CM | POA: Insufficient documentation

## 2023-10-28 DIAGNOSIS — I700 Atherosclerosis of aorta: Secondary | ICD-10-CM | POA: Diagnosis not present

## 2023-10-28 DIAGNOSIS — G43809 Other migraine, not intractable, without status migrainosus: Secondary | ICD-10-CM | POA: Insufficient documentation

## 2023-10-28 DIAGNOSIS — R911 Solitary pulmonary nodule: Secondary | ICD-10-CM

## 2023-10-28 DIAGNOSIS — R519 Headache, unspecified: Secondary | ICD-10-CM | POA: Diagnosis present

## 2023-10-28 LAB — CBC WITH DIFFERENTIAL/PLATELET
Abs Immature Granulocytes: 0.02 10*3/uL (ref 0.00–0.07)
Basophils Absolute: 0.1 10*3/uL (ref 0.0–0.1)
Basophils Relative: 1 %
Eosinophils Absolute: 0.2 10*3/uL (ref 0.0–0.5)
Eosinophils Relative: 3 %
HCT: 34.3 % — ABNORMAL LOW (ref 36.0–46.0)
Hemoglobin: 11.1 g/dL — ABNORMAL LOW (ref 12.0–15.0)
Immature Granulocytes: 0 %
Lymphocytes Relative: 33 %
Lymphs Abs: 2.2 10*3/uL (ref 0.7–4.0)
MCH: 30.7 pg (ref 26.0–34.0)
MCHC: 32.4 g/dL (ref 30.0–36.0)
MCV: 95 fL (ref 80.0–100.0)
Monocytes Absolute: 0.7 10*3/uL (ref 0.1–1.0)
Monocytes Relative: 11 %
Neutro Abs: 3.4 10*3/uL (ref 1.7–7.7)
Neutrophils Relative %: 52 %
Platelets: 326 10*3/uL (ref 150–400)
RBC: 3.61 MIL/uL — ABNORMAL LOW (ref 3.87–5.11)
RDW: 15 % (ref 11.5–15.5)
WBC: 6.5 10*3/uL (ref 4.0–10.5)
nRBC: 0 % (ref 0.0–0.2)

## 2023-10-28 LAB — COMPREHENSIVE METABOLIC PANEL
ALT: 13 U/L (ref 0–44)
AST: 21 U/L (ref 15–41)
Albumin: 3.4 g/dL — ABNORMAL LOW (ref 3.5–5.0)
Alkaline Phosphatase: 60 U/L (ref 38–126)
Anion gap: 11 (ref 5–15)
BUN: 27 mg/dL — ABNORMAL HIGH (ref 8–23)
CO2: 18 mmol/L — ABNORMAL LOW (ref 22–32)
Calcium: 9.5 mg/dL (ref 8.9–10.3)
Chloride: 107 mmol/L (ref 98–111)
Creatinine, Ser: 2 mg/dL — ABNORMAL HIGH (ref 0.44–1.00)
GFR, Estimated: 27 mL/min — ABNORMAL LOW (ref >60)
Glucose, Bld: 89 mg/dL (ref 70–99)
Potassium: 3.7 mmol/L (ref 3.5–5.1)
Sodium: 136 mmol/L (ref 135–145)
Total Bilirubin: 0.7 mg/dL (ref 0.0–1.2)
Total Protein: 6.7 g/dL (ref 6.5–8.1)

## 2023-10-28 LAB — GLUCOSE, CAPILLARY: Glucose-Capillary: 90 mg/dL (ref 70–99)

## 2023-10-28 MED ORDER — MAGNESIUM SULFATE 2 GM/50ML IV SOLN
2.0000 g | Freq: Once | INTRAVENOUS | Status: AC
Start: 2023-10-28 — End: 2023-10-28
  Administered 2023-10-28: 2 g via INTRAVENOUS
  Filled 2023-10-28: qty 50

## 2023-10-28 MED ORDER — DEXAMETHASONE SODIUM PHOSPHATE 10 MG/ML IJ SOLN
10.0000 mg | Freq: Once | INTRAMUSCULAR | Status: AC
  Administered 2023-10-28: 10 mg via INTRAVENOUS
  Filled 2023-10-28: qty 1

## 2023-10-28 MED ORDER — SODIUM CHLORIDE 0.9 % IV BOLUS
1000.0000 mL | Freq: Once | INTRAVENOUS | Status: AC
  Administered 2023-10-28: 1000 mL via INTRAVENOUS

## 2023-10-28 MED ORDER — DIPHENHYDRAMINE HCL 50 MG/ML IJ SOLN
12.5000 mg | Freq: Once | INTRAMUSCULAR | Status: AC
  Administered 2023-10-28: 12.5 mg via INTRAVENOUS
  Filled 2023-10-28: qty 1

## 2023-10-28 MED ORDER — FLUDEOXYGLUCOSE F - 18 (FDG) INJECTION
8.7800 | Freq: Once | INTRAVENOUS | Status: AC
  Administered 2023-10-28: 8.78 via INTRAVENOUS

## 2023-10-28 MED ORDER — HYDROMORPHONE HCL 1 MG/ML IJ SOLN
1.0000 mg | Freq: Once | INTRAMUSCULAR | Status: AC
  Administered 2023-10-28: 1 mg via INTRAVENOUS
  Filled 2023-10-28: qty 1

## 2023-10-28 MED ORDER — METOCLOPRAMIDE HCL 5 MG/ML IJ SOLN
10.0000 mg | Freq: Once | INTRAMUSCULAR | Status: AC
  Administered 2023-10-28: 10 mg via INTRAVENOUS
  Filled 2023-10-28: qty 2

## 2023-10-28 NOTE — ED Provider Notes (Signed)
  EMERGENCY DEPARTMENT AT [facility] Provider Note   CSN: [id number] Arrival date & time: 10/28/23  4098    History  Chief Complaint  Patient presents with   Migraine    Paula Stout is a 68 y.o. female here for evaluation of migraine.  History of similar.  Started yesterday.  States she follows with Dr. Everlena Cooper with neurology.  She states she has a migraine cocktail consisting of Dilaudid, Decadron, Phenergan/Reglan, Benadryl, IV fluids.  States this feels typical of her chronic migraine.  She went for a PET scan earlier today and noted that her headache did worsen after she had the PET scan.  She has been taking Nurtec, 2 doses total without relief.  She has sensitivity to light and smell.  Typically gets migraines when her Botox starts to wear off.  She is due in March.  Some nausea.      HPI     Home Medications Prior to Admission medications   Medication Sig Start Date End Date Taking? Authorizing Provider  acetaminophen (TYLENOL) 500 MG tablet Take 1,000 mg by mouth every 6 (six) hours as needed for mild pain.    [provider]  atorvastatin (LIPITOR) 80 MG tablet TAKE 1 TABLET DAILY 02/09/23   Sharlene Dory, DO  blood glucose meter kit and supplies One Touch Ultra  Check blood sugars once daily Dx: E11.9 Patient taking differently: 1 each by Other route See admin instructions. One Touch Ultra  Check blood sugars once daily Dx: E11.9 10/01/21   Sharlene Dory, DO  botulinum toxin Type A (BOTOX) 200 units injection Inject 155 units IM into multiple site in the face,neck and head once every 90 days Patient taking differently: Inject 155 Units into the muscle See admin instructions. Inject 155 units IM into multiple site in the face,neck and head once every 90 days 06/15/22   Drema Dallas, DO  budesonide-formoterol (SYMBICORT) 80-4.5 MCG/ACT inhaler Inhale 2 puffs into the lungs 2 (two) times daily. 08/11/23   Cobb, Ruby Cola, NP   buPROPion (WELLBUTRIN XL) 300 MG 24 hr tablet Take 1 tablet (300 mg total) by mouth daily. 10/19/23   Joan Flores, NP  diltiazem (CARDIZEM CD) 120 MG 24 hr capsule Take 1 capsule (120 mg total) by mouth daily. 07/19/23   Camnitz, Andree Coss, MD  diltiazem (CARDIZEM) 30 MG tablet Take 1 tablet every 4 hours AS NEEDED for AFIB or SVT heart rate >95 as long as top BP >100. Patient taking differently: Take 30 mg by mouth every 4 (four) hours as needed (for AFIB or SVT heart rate >95 as long as top BP >100.). Take 1 tablet every 4 hours AS NEEDED for AFIB or SVT heart rate >95 as long as top BP >100. 12/03/22   Sherie Don, NP  ELIQUIS 5 MG TABS tablet TAKE 1 TABLET TWICE A DAY 02/02/23   Wendling, Jilda Roche, DO  EPINEPHrine 0.3 mg/0.3 mL IJ SOAJ injection Inject 0.3 mg into the muscle as needed for anaphylaxis. 11/17/20   Sharlene Dory, DO  escitalopram (LEXAPRO) 10 MG tablet Take 1 tablet (10 mg total) by mouth daily. 10/19/23   Joan Flores, NP  ezetimibe (ZETIA) 10 MG tablet TAKE 1 TABLET DAILY 02/03/23   Sharlene Dory, DO  FARXIGA 10 MG TABS tablet TAKE 1 TABLET DAILY 08/09/23   Sharlene Dory, DO  fenofibrate 160 MG tablet Take 1 tablet (160 mg total) by mouth daily. 09/21/23  Sharlene Dory, DO  fluconazole (DIFLUCAN) 150 MG tablet Take 1 tab, repeat in 72 hours if no improvement. 09/26/23   Sharlene Dory, DO  fluticasone (FLONASE) 50 MCG/ACT nasal spray Place 2 sprays into both nostrils daily. Patient taking differently: Place 2 sprays into both nostrils 3 (three) times a week. 01/29/23   Hawks, Christy A, FNP  Fremanezumab-vfrm (AJOVY) 225 MG/1.5ML SOAJ Inject 225 mg into the skin every 28 (twenty-eight) days. 07/28/23   Drema Dallas, DO  levalbuterol (XOPENEX HFA) 45 MCG/ACT inhaler Inhale 2 puffs into the lungs every 6 (six) hours as needed for wheezing or shortness of breath. 08/11/23   Cobb, Ruby Cola, NP  levocetirizine (XYZAL) 5 MG  tablet TAKE 1 TABLET EVERY EVENING Patient taking differently: Take 5 mg by mouth every evening. 01/14/23   Sharlene Dory, DO  lidocaine (LIDODERM) 5 % Place 1 patch onto the skin daily. Remove & Discard patch within 12 hours or as directed by MD 10/14/23   Nicanor Alcon, April, MD  LORazepam (ATIVAN) 0.5 MG tablet Take 1 tablet (0.5 mg total) by mouth every 8 (eight) hours. 10/19/23   Joan Flores, NP  Lurasidone HCl 120 MG TABS Take 1 tablet (120 mg total) by mouth at bedtime. 10/19/23   Joan Flores, NP  magnesium oxide (MAG-OX) 400 (240 Mg) MG tablet Take 400 mg by mouth daily.    [provider]  metoCLOPramide (REGLAN) 10 MG tablet Take 1 tablet (10 mg total) by mouth every 6 (six) hours. 06/30/23   Elayne Snare K, DO  metoprolol succinate (TOPROL-XL) 100 MG 24 hr tablet Take 1 tablet (100 mg total) by mouth 2 (two) times daily. Take with or immediately following a meal. 04/12/23   Camnitz, Andree Coss, MD  metoprolol tartrate (LOPRESSOR) 25 MG tablet Take 1 tablet (25 mg total) by mouth every 4 (four) hours as needed (PALPITATIONS). 08/24/22   Camnitz, Andree Coss, MD  MOUNJARO 12.5 MG/0.5ML Pen INJECT 12.5 MG UNDER THE SKIN WEEKLY 07/18/23   Sharlene Dory, DO  Multiple Vitamins-Minerals (MULTIVITAMIN WITH MINERALS) tablet Take 1 tablet by mouth daily. Daily Diabetes Health Pack - MVI + chromium, fish oil + vit D, magnesium, vit C, alpha lipoic acid w/ green tea    [provider]  nitroGLYCERIN (NITROSTAT) 0.4 MG SL tablet Place 1 tablet (0.4 mg total) under the tongue every 5 (five) minutes as needed for chest pain. 03/16/23   Georgeanna Lea, MD  ondansetron (ZOFRAN-ODT) 4 MG disintegrating tablet Take 1 tablet (4 mg total) by mouth every 8 (eight) hours as needed for nausea or vomiting. 09/19/23   Wendling, Jilda Roche, DO  ONETOUCH VERIO test strip USE DAILY TO CHECK BLOOD SUGAR 10/29/22   Sharlene Dory, DO  pantoprazole (PROTONIX) 40 MG  tablet Take 1 tablet (40 mg total) by mouth daily. 11/22/22   Sharlene Dory, DO  QUEtiapine (SEROQUEL) 300 MG tablet Take 1 tablet (300 mg total) by mouth at bedtime. 10/19/23   Joan Flores, NP  Rimegepant Sulfate (NURTEC) 75 MG TBDP Take 1 tablet (75 mg total) by mouth as needed (take 1 tab at the earlist onset of a migraine. Max 1 tab in 24 hours). 04/04/23   Drema Dallas, DO  topiramate (TOPAMAX) 50 MG tablet TAKE 3 TABLETS TWICE A DAY 08/23/23   Sharlene Dory, DO      Allergies    Bee venom, Onion, Tetanus-diphtheria toxoids td, Sumatriptan, Zolpidem, Asa [aspirin],  Iodine, Nsaids, Sulfa antibiotics, and Vancomycin    Review of Systems   Review of Systems  Constitutional: Negative.   HENT: Negative.    Eyes:  Positive for photophobia.  Respiratory: Negative.    Cardiovascular: Negative.   Gastrointestinal: Negative.   Genitourinary: Negative.   Musculoskeletal: Negative.   Skin: Negative.   Neurological:  Positive for headaches. Negative for dizziness, tremors, seizures, syncope, facial asymmetry, speech difficulty, weakness, light-headedness and numbness.  All other systems reviewed and are negative.   Physical Exam Updated Vital Signs BP 117/84   Pulse 76   Temp 98.3 F (36.8 C)   Resp 18   Ht 5\' 6"  (1.676 m)   Wt 79.4 kg   SpO2 97%   BMI 28.25 kg/m  Physical Exam Physical Exam  Constitutional: Pt is oriented to person, place, and time. Pt appears well-developed and well-nourished. No distress.  HENT:  Head: Normocephalic and atraumatic.  Mouth/Throat: Oropharynx is clear and moist.  Eyes: Conjunctivae and EOM are normal. Pupils are equal, round, and reactive to light. No scleral icterus.  No horizontal, vertical or rotational nystagmus  Neck: Normal range of motion. Neck supple.  Full active and passive ROM without pain No midline or paraspinal tenderness No nuchal rigidity or meningeal signs  Cardiovascular: Normal rate, regular rhythm and  intact distal pulses.   Pulmonary/Chest: Effort normal and breath sounds normal. No respiratory distress. Pt has no wheezes. No rales.  Abdominal: Soft. Bowel sounds are normal. There is no tenderness. There is no rebound and no guarding.  Musculoskeletal: Normal range of motion.  Lymphadenopathy:    No cervical adenopathy.  Neurological: Pt. is alert and oriented to person, place, and time. He has normal reflexes. No cranial nerve deficit.  Exhibits normal muscle tone. Coordination normal.  Mental Status:  Alert, oriented, thought content appropriate. Speech fluent without evidence of aphasia. Able to follow 2 step commands without difficulty.  Cranial Nerves:  II:  Peripheral visual fields grossly normal, pupils equal, round, reactive to light III,IV, VI: ptosis not present, extra-ocular motions intact bilaterally  V,VII: smile symmetric, facial light touch sensation equal VIII: hearing grossly normal bilaterally  IX,X: midline uvula rise  XI: bilateral shoulder shrug equal and strong XII: midline tongue extension  Motor:  5/5 in upper and lower extremities bilaterally including strong and equal grip strength and dorsiflexion/plantar flexion Sensory: Pinprick and light touch normal in all extremities.  Deep Tendon Reflexes: 2+ and symmetric  Cerebellar: normal finger-to-nose with bilateral upper extremities Gait: normal gait and balance CV: distal pulses palpable throughout   Skin: Skin is warm and dry. No rash noted. Pt is not diaphoretic.  Psychiatric: Pt has a normal mood and affect. Behavior is normal. Judgment and thought content normal.  Nursing note and vitals reviewed.  ED Results / Procedures / Treatments   Labs (all labs ordered are listed, but only abnormal results are displayed) Labs Reviewed  CBC WITH DIFFERENTIAL/PLATELET - Abnormal; Notable for the following components:      Result Value   RBC 3.61 (*)    Hemoglobin 11.1 (*)    HCT 34.3 (*)    All other components  within normal limits  COMPREHENSIVE METABOLIC PANEL - Abnormal; Notable for the following components:   CO2 18 (*)    BUN 27 (*)    Creatinine, Ser 2.00 (*)    Albumin 3.4 (*)    GFR, Estimated 27 (*)    All other components within normal limits    EKG  None  Radiology No results found.  Procedures Procedures    Medications Ordered in ED Medications  diphenhydrAMINE (BENADRYL) injection 12.5 mg (12.5 mg Intravenous Given 10/28/23 2136)  HYDROmorphone (DILAUDID) injection 1 mg (1 mg Intravenous Given 10/28/23 2136)  dexamethasone (DECADRON) injection 10 mg (10 mg Intravenous Given 10/28/23 2136)  sodium chloride 0.9 % bolus 1,000 mL (0 mLs Intravenous Stopped 10/28/23 2258)  metoCLOPramide (REGLAN) injection 10 mg (10 mg Intravenous Given 10/28/23 2136)  magnesium sulfate IVPB 2 g 50 mL (0 g Intravenous Stopped 10/28/23 2317)    ED Course/ Medical Decision Making/ A&P   68 year old here for evaluation of headache.  She states she has recurrent chronic headaches.  Follows with neurology.  She states she has a migraine cocktail which consists of Dilaudid, Benadryl, Reglan/Phenergan, Decadron and IV fluids.  Started yesterday.  She did have a PET scan earlier today and stated her headache worsened after getting the PET scan.  She denies any symptoms of" headache.  She has a nonfocal neuroexam without deficits.  She no fever, neck stiffness or neck rigidity to suggest meningitis.  Does admit some nausea and vomiting.  Will plan on checking basic labs, migraine cocktail.  Labs personally viewed and interpreted:  CBC without leukocytosis Metabolic panel creatinine 2.0 similar to prior  Patient reassessed.  She states she still has headache.  Reiterates that this feels like her typical migraine however worse.  She is requesting additional dose of Dilaudid.  I discussed with patient that opioids typically cause rebound migraine.  I offered magnesium.  She states she has had that previously for  migraines.  Will hold off on additional dose of Dilaudid and give magnesium see if this helps with her headache.  Reassessed.  Headache improved after magnesium.  Will have her go home and rest.  Encouraged her to return for any worsening symptoms.  The patient has been appropriately medically screened and/or stabilized in the ED. I have low suspicion for any other emergent medical condition which would require further screening, evaluation or treatment in the ED or require inpatient management.  Patient is hemodynamically stable and in no acute distress.  Patient able to ambulate in department prior to ED.  Evaluation does not show acute pathology that would require ongoing or additional emergent interventions while in the emergency department or further inpatient treatment.  I have discussed the diagnosis with the patient and answered all questions.  Pain is been managed while in the emergency department and patient has no further complaints prior to discharge.  Patient is comfortable with plan discussed in room and is stable for discharge at this time.  I have discussed strict return precautions for returning to the emergency department.  Patient was encouraged to follow-up with PCP/specialist refer to at discharge.                                Medical Decision Making Amount and/or Complexity of Data Reviewed External Data Reviewed: labs, radiology, ECG and notes. Labs: ordered. Decision-making details documented in ED Course.  Risk OTC drugs. Prescription drug management. Parenteral controlled substances. Decision regarding hospitalization. Diagnosis or treatment significantly limited by social determinants of health.         Final Clinical Impression(s) / ED Diagnoses Final diagnoses:  Other migraine without status migrainosus, not intractable    Rx / DC Orders ED Discharge Orders     None  Tonny Isensee A, PA-C 10/28/23 2352    Pricilla Loveless,  MD 10/30/23 [phone]

## 2023-10-28 NOTE — Discharge Instructions (Addendum)
Make sure to follow-up with Dr. Moises Blood office  Follow-up outpatient, return for new or worsening symptoms

## 2023-10-28 NOTE — ED Triage Notes (Signed)
Pt POV steady gait- c/o migraine x1 day, worsening this AM after PET scan. Taken Nurtec x2 over 24 h, no relief.

## 2023-10-31 ENCOUNTER — Telehealth: Payer: Self-pay | Admitting: Emergency Medicine

## 2023-10-31 ENCOUNTER — Encounter (HOSPITAL_COMMUNITY): Payer: Self-pay | Admitting: Emergency Medicine

## 2023-10-31 ENCOUNTER — Other Ambulatory Visit: Payer: Self-pay

## 2023-10-31 NOTE — Telephone Encounter (Signed)
 Patient is scheduled for surgery tomorrow but she didn't not hold her eliquis for two days. How should ore admissions proceed?

## 2023-10-31 NOTE — Telephone Encounter (Signed)
 We are going to need to postpone and reschedule for next week. I will forward this to my office also to get her rescheduled. Hold eliquis 2 days, hold monjaro 1 week. Thank you

## 2023-10-31 NOTE — Anesthesia Preprocedure Evaluation (Addendum)
 Anesthesia Evaluation  Patient identified by MRN, date of birth, ID band Patient awake    Reviewed: Allergy & Precautions, NPO status , Patient's Chart, lab work & pertinent test results  Airway Mallampati: I  TM Distance: >3 FB Neck ROM: Full    Dental no notable dental hx.    Pulmonary asthma  RUL nodule    Pulmonary exam normal        Cardiovascular hypertension, Pt. on home beta blockers and Pt. on medications + dysrhythmias Atrial Fibrillation  Rhythm:Irregular Rate:Normal  Echo 09/18/23 [facility]): CONCLUSIONS:  1. Normal left ventricular size. 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 Ejection fraction is normal and is visually assessed  at 60-65%. Normal left ventricular systolic function. 3D LV Ejection Fraction  is 62 %. E/e` ratio is between 8 and 15 consistent with indeterminate filling  pressures.  2. Trileaflet aortic valve. Trace aortic regurgitation.  3. There is moderate tricuspid regurgitation.  4. Small pericardial effusion. The effusion is circumferential to the heart.  5. There is mild ascending aorta dilation.     Neuro/Psych  Headaches PSYCHIATRIC DISORDERS Anxiety Depression Bipolar Disorder      GI/Hepatic Neg liver ROS,GERD  Medicated,,  Endo/Other  diabetes  Patient on GLP-1 Agonist  Renal/GU CRFRenal disease     Musculoskeletal  (+) Arthritis ,    Abdominal Normal abdominal exam  (+)   Peds  Hematology  (+) Blood dyscrasia (Eliquis), anemia   Anesthesia Other Findings RIGHT UPPER LOBE NODULE  Reproductive/Obstetrics                             Anesthesia Physical Anesthesia Plan  ASA: 3  Anesthesia Plan: General   Post-op Pain Management:    Induction: Intravenous  PONV Risk Score and Plan: 3 and Ondansetron, Dexamethasone, Treatment may vary due to age or medical condition and Midazolam  Airway Management Planned: Mask  and Oral ETT  Additional Equipment: None  Intra-op Plan:   Post-operative Plan: Extubation in OR  Informed Consent: I have reviewed the patients History and Physical, chart, labs and discussed the procedure including the risks, benefits and alternatives for the proposed anesthesia with the patient or authorized representative who has indicated his/her understanding and acceptance.     Dental advisory given  Plan Discussed with: CRNA  Anesthesia Plan Comments: (See PAT note written by Shonna Chock, PA-C.  )       Anesthesia Quick Evaluation

## 2023-10-31 NOTE — Telephone Encounter (Signed)
 Paula Stout spoke with the pt and gave her info on the bronch that was rescheduled  Pt expressed that she was upset that this was being rescheduled and does not think that she can do the new date of 11/08/23  She said that it was incorrect that she did not stop the Eliquis in time  I called prelab testing and spoke with Victorino Dike  She advised that the pt clearly told her when asked earlier today, that her last dose of eliquis was taken just yesterday  I tried calling the pt back to discuss, but there was no answer- LMTCB   Routing to myself for f/u and to RB as FYI

## 2023-10-31 NOTE — Progress Notes (Signed)
 Message left at Dr. Kavin Leech office making them aware patient last dose of eliquis was on 10-30-23.  Anesthesia made as well

## 2023-10-31 NOTE — Progress Notes (Signed)
 PCP - Sharlene Dory, DO  Cardiologist - Georgeanna Lea, MD   PPM/ICD - denies Device Orders - n/a Rep Notified - n/a  Chest x-ray - 07-26-23 CHEST CT 10-17-23 EKG - 07-26-23 Stress Test - 12-03-21 ECHO - 06-23-23 Cardiac Cath - 03-04-20  CPAP - denies  GLP-1 -MOUNJARO Last dose 10-30-23  Fasting Blood Sugar - Per patient around 90. Checks Blood Sugar Per patient 1-2 times /day  Blood Thinner Instructions: ELIQUIS 5 last dose 10-29-22 Aspirin Instructions:   ERAS Protcol - NPO  COVID TEST- n/a  Anesthesia review: yes  Patient verbally denies any shortness of breath, fever, cough and chest pain during phone call   -------------  SDW INSTRUCTIONS given:  Your procedure is scheduled on November 01, 2023.  Report to [facility] Main Entrance "A" at 5:30 A.M., and check in at the Admitting office.  Call this number if you have problems the morning of surgery:  [phone]   Remember:  Do not eat or drink after midnight the night before your surgery    Take these medicines the morning of surgery with A SIP OF WATER  acetaminophen (TYLENOL)  atorvastatin (LIPITOR)  budesonide-formoterol (SYMBICORT)  buPROPion (WELLBUTRIN XL)  diltiazem (CARDIZEM CD)  EPINEPHrine injection  escitalopram (LEXAPRO)  ezetimibe (ZETIA)  fenofibrate  LORazepam (ATIVAN)  metoCLOPramide (REGLAN)  fluticasone (FLONASE)  metoprolol succinate (TOPROL-XL)  levalbuterol (XOPENEX HFA)  ondansetron (ZOFRAN-ODT)  pantoprazole (PROTONIX)  topiramate (TOPAMAX)    As of today, STOP taking any Aspirin (unless otherwise instructed by your surgeon) Aleve, Naproxen, Ibuprofen, Motrin, Advil, Goody's, BC's, all herbal medications, fish oil, and all vitamins.                      Do not wear jewelry, make up, or nail polish            Do not wear lotions, powders, perfumes/colognes, or deodorant.            Do not shave 48 hours prior to surgery.  Men may shave face and neck.             Do not bring valuables to the hospital.            [facility] is not responsible for any belongings or valuables.  Do NOT Smoke (Tobacco/Vaping) 24 hours prior to your procedure If you use a CPAP at night, you may bring all equipment for your overnight stay.   Contacts, glasses, dentures or bridgework may not be worn into surgery.      For patients admitted to the hospital, discharge time will be determined by your treatment team.   Patients discharged the day of surgery will not be allowed to drive home, and someone needs to stay with them for 24 hours.    Special instructions:   [place]- Preparing For Surgery  Before surgery, you can play an important role. Because skin is not sterile, your skin needs to be as free of germs as possible. You can reduce the number of germs on your skin by washing with CHG (chlorahexidine gluconate) Soap before surgery.  CHG is an antiseptic cleaner which kills germs and bonds with the skin to continue killing germs even after washing.    Oral Hygiene is also important to reduce your risk of infection.  Remember - BRUSH YOUR TEETH THE MORNING OF SURGERY WITH YOUR REGULAR TOOTHPASTE  Please do not use if you have an allergy to CHG or antibacterial soaps.  If your skin becomes reddened/irritated stop using the CHG.  Do not shave (including legs and underarms) for at least 48 hours prior to first CHG shower. It is OK to shave your face.  Please follow these instructions carefully.   Shower the NIGHT BEFORE SURGERY and the MORNING OF SURGERY with DIAL Soap.   Pat yourself dry with a CLEAN TOWEL.  Wear CLEAN PAJAMAS to bed the night before surgery  Place CLEAN SHEETS on your bed the night of your first shower and DO NOT SLEEP WITH PETS.   Day of Surgery: Please shower morning of surgery  Wear Clean/Comfortable clothing the morning of surgery Do not apply any deodorants/lotions.   Remember to brush your teeth WITH YOUR REGULAR TOOTHPASTE.    Questions were answered. Patient verbalized understanding of instructions.

## 2023-10-31 NOTE — Telephone Encounter (Signed)
 Got the patient moved I will call and give her the info

## 2023-10-31 NOTE — Progress Notes (Signed)
 Anesthesia Chart Review: Paula Stout  Case: [id number] Date/Time: 11/08/23 0730   Procedure: ROBOTIC ASSISTED NAVIGATIONAL BRONCHOSCOPY (Right)   Anesthesia type: General   Pre-op diagnosis: RIGHT UPPER LOBE NODULE   [facility] / [facility]   Surgeons: Leslye Peer, MD       DISCUSSION: Patient is a 68 year old female scheduled for the above procedure. She has a RUL lung nodule, overall stable in size since 05/2023 but concerning appearance for possible malignancy. Bronchoscopy planned for tissue diagnosis. If malignant then surgical referral may be considered since with "acceptable PFT". Bronchoscopy was initially scheduled for 11/01/2023 but rescheduled for 11/08/23 after she forgot to hold Eliquis and Mounjaro.  History includes never smoker, afib (s/p ablation 09/2018; 10/27/22), PSVT (s/p ablation 05/04/23), chest pain (normal coronaries 02/2020, low risk stress test 11/2021), HTN, dyslipidemia, DM2 (with polyneuropathy), CKD, asthma, PTSD, Bipolar 1 disorder, migraines (gets Botox injections, multiple ED visit, last 10/28/23), IBS, interstitial cystitis, right thyroid goiter (07/07/23 FNA: "thyroid nodule was benign", 1 year ENT f/u recommended).  Admission at [facility] 09/17/23 - 09/19/23 for syncope which climbing stairs, felt likely due to orthostatic hypotension. Daughter witness witnessed and estimated LOC for about 30 seconds. BNP and HS troponin normal. Creatinine elevated at 2.04, but overall stable. She was also diagnosed with Klebsiella pneumoniae UTI. RLL lung lesion noted on imaging (known and followed in [place]) with continued out-patient follow-up recommended. She was treated with IV fluids and antibiotics. Toprol and Cardizem doses reduced due to hypotension. CT head and CTA head/neck without acute findings. Echo on 09/18/23 showed moderate to severe concentric LVH, LVEF 60-65%, trace AR, moderate TR, small pericardial effusion.  Last visit  with primary cardiologist Dr. Bing Matter was on 05/24/23. He notes history of chronic atypical chest pains (described as "continued sensation in the chest there is no only relieving no aggravating factors"). She had normal coronaries that were tortuous, but without evidence of calcification or plaque on 03/04/20 LHC. She also had a low risk stress test in March 2023. Her primary concern was periodic tachycardic episodes post ablation. He ordered an updated echo and Zio monitor. September 2024 echo showed preserved LVF, and long term monitor showed 6 episodes of SVT, fastest rate 127 bpm of 6 beats. Multiple triggered events for PVCs. She had EP follow-up the next month. Next primary cardiology visit is scheduled for 12/13/23.   Last EP visit with Dr. Elberta Fortis was on 06/20/23. 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 S/p AAVNRT 05/04/23. 3 month follow-up planned. Next visit is scheduled for 11/14/23.   I called and spoke with patient. She denied nitroglycerine use in "a while". She reported what she described as two additional syncopal episodes since 09/17/23. One episode occurred when she got out of bed during the middle of the night, and the last episodes was on 10/13/23 while walking up stairs in her crocs. Unlike her initial syncope episode resulting in a fall, this episode was more of a mechanical fall which resulted in her falling backwards and hitting her head. She felt dazed right afterwards and thought she might have had transient LOC. Head, c-spine, and LLE imaging was negative for acute findings in the ED. She has not noted any palpitations prior to her syncope episodes. She did feel "woozy" before her first 2 spells. She has been told syncope may have been related to effects  of Toprol and/or Cardizem. On 10/20/23, her nephrologist Dr. Kathrene Bongo stopped her Cardizem due to  hypotension. She remains on Toprol 100 mg BID. She said home BP ranging A 110's/80's. Since stopping Cardizem, baseline HR has been running primarily ~ 70-80's. She continues to have periodic tachycardia spells that last < 1 min with rates up to 150 a couple of times a week. She denied any worsening symptoms or tachycardia since she was last evaluated by Dr. Elberta Fortis or Dr. Bing Matter.  In regards to her right thyroid goiter history, she denied compressive symptoms. No orthopnea or dysphagia.  We discussed new procedure date and time, as well as Dr. Kavin Leech instructions to hold Eliquis for 2 days and [facility] for 1 week prior to surgery. Last Eliquis is planned for for 11/05/22, and last Moujaro dose was 10/30/23.  I also advised that per my discussion with an anesthesiologist Bradley Ferris, Alycia Rossetti, MD), I would reach out to cardiology regarding surgery plans to see if they would need to see or call her beforehand given syncopal episodes since her last visits.   ADDENDUM 11/04/23 9:02 AM: [name] had a telephonic preoperative cardiology evaluation on 11/02/23, but ultimately referred for in-person EP evaluation due to ongoing dizziness and presyncope. She had evaluation by Sherie Don, NP on 11/03/23. No further syncope since stopping diltiazem, but still with frequent dizziness with position changes and walking. She did admit to little oral intake in the daytime and was primarily eating just dinner. She is on Mounjaro and does not have much of an appetite. She has lost at least 50 lbs. She was hypotension (BP max 89/60) with positive orthostatics. Toprol was decreased to 50 mg daily and can uses Lopressor 25 mg as needed for tachypalpitations lasting > 1 hour. She was encouraged to eat small snacks and significantly increase her fluid intake throughout the day. If unable to increase oral intake then may need to consider stopping Mounjaro.     If regards to preoperative evaluation:  "Planning for robotic assisted  bronchoscopy with general anesthesia RCRI score = 1, indicating a 0.9% perioperative risk of major cardiac event No additional workup needed prior to surgery."   VS: Ht 5\' 6"  (1.676 m)   Wt 79.4 kg   BMI 28.25 kg/m  Wt Readings from Last 3 Encounters:  10/28/23 79.4 kg  10/28/23 79.4 kg  10/20/23 82.2 kg   BP Readings from Last 3 Encounters:  10/28/23 117/84  10/20/23 102/64  10/17/23 120/70   Pulse Readings from Last 3 Encounters:  10/28/23 76  10/20/23 66  10/20/23 69    PROVIDERS: Sharlene Dory, DO is PCP  Gypsy Balsam, MD is cardiologist. Next visit scheduled 12/13/23. Loman Brooklyn, MD is EP cardiologist. Next visit scheduled 11/14/23.  Levy Pupa, MD is pulmonologist Maretta Los, MD is ENT ([facility]) Shon Millet, DO is neurologist Annie Sable, MD is nephrologist  LABS: Currently, most recent lab results in [facility] include: Lab Results  Component Value Date   WBC 6.5 10/28/2023   HGB 11.1 (L) 10/28/2023   HCT 34.3 (L) 10/28/2023   PLT 326 10/28/2023   GLUCOSE 89 10/28/2023   CHOL 127 05/31/2023   TRIG 176.0 (H) 05/31/2023   HDL 34.30 (L) 05/31/2023   LDLCALC 58 05/31/2023   ALT 13 10/28/2023   AST 21 10/28/2023   NA 136 10/28/2023   K 3.7 10/28/2023   CL 107 10/28/2023   CREATININE 2.00 (H) 10/28/2023   BUN 27 (H) 10/28/2023   CO2 18 (  L) 10/28/2023   TSH 0.85 05/20/2023   HGBA1C 6.4 05/31/2023   MICROALBUR 0.9 02/28/2023     Sleep Study 05/07/20: Summary/Diagnosis: There is very mild sleep apnea present - The AHI was 6.7/h and treatment is considered optional in this range of apneas and hypopneas. 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 this form of apnea is only responsive to CPAP treatment.     Recommendation: Recommend apnea treatment with PAP therapy. this may affect hypersomnia but not as likely the  (sleep related?) headaches.    IMAGES: PET Scan 10/28/23: IMPRESSION: 1. 18 mm  right lower lobe pulmonary lesion demonstrates low level hypermetabolism. Findings suspicious for adenocarcinoma. Recommend biopsy. 2. No findings for metastatic disease involving the neck, chest, abdomen/pelvis or bony structures. 3. 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 4. Age advanced aortic and iliac artery calcifications. 5. Aortic atherosclerosis.   CT Super D Chest 10/17/23: IMPRESSION: 1. Persistent 16 mm right lower lobe pulmonary lesion. 2. No mediastinal or hilar mass or adenopathy. 3. 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 4. Right thyroid goiter with substernal extension. 5. Aortic atherosclerosis.   CT Head/C-spine 10/14/23: IMPRESSION: 1. Moderate to marked severity multilevel degenerative changes, as described above. 2. No acute cervical spine fracture or subluxation.  CTA Head/Neck 09/17/23 [facility]): IMPRESSION:  CTA Head:  1.  No intracranial proximal arterial occlusion.  CTA Neck:  1.  No cervical carotid or vertebral artery occlusion or dissection.   US Thyroid 05/24/23: IMPRESSION: 1. Multinodular thyroid. 2. Solid nodules about the right inferior (labeled 1, 3.9 cm) and right superior (labeled 2, 1.5 cm) thyroid meet criteria (TI-RADS category 4) for tissue sampling. Recommend ultrasound-guided fine-needle aspiration of both nodules. 3. Solid cystic nodule in the left inferior thyroid (labeled 3, 2.3 cm) meets criteria (TI-RADS category 3) for 1 year ultrasound surveillance.    EKG: EKG 09/18/23  IMPRESSION:  SINUS RHYTHM  BORDERLINE LEFT AXIS DEVIATION  [QRS AXIS < -20]  MODERATE INTRAVENTRICULAR CONDUCTION DELAY  [110+ ms QRS DURATION]  no stemi, rate 69. nomral axis, nomral intevrals, no previous,  electronically Signed By: Alden Benjamin, MD   EKG 07/26/23: Sinus tachycardia at 109 bpm Left anterior fascicular block Consider anterior infarct Borderline repolarization abnormality Prolonged QT interval (QT 392 ms, QTcB  528 ms) Confirmed by Glyn Ade [phone]) on 07/26/2023 9:49:44 PM   CV: Echo 09/18/23 ([facility]): CONCLUSIONS:  1. Normal left ventricular size. 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 Ejection fraction is normal and is visually assessed  at 60-65%. Normal left ventricular systolic function. 3D LV Ejection Fraction  is 62 %. E/e` ratio is between 8 and 15 consistent with indeterminate filling  pressures.  2. Trileaflet aortic valve. Trace aortic regurgitation.  3. There is moderate tricuspid regurgitation.  4. Small pericardial effusion. The effusion is circumferential to the heart.  5. There is mild ascending aorta dilation.    Long term monitor 05/24/23 - 06/07/23:  Summary and conclusions: 6 episode of supraventricular tachycardia with the fastest episodes at rate of 127 total of 6 beats. Multiple triggered events for PVCs.   Nuclear stress test 12/03/21:   The study is normal. The study is low risk.   Left ventricular function is normal. Nuclear stress EF: 61 %. The left ventricular ejection fraction is normal (55-65%). End diastolic cavity size is normal.   Normal blood pressure and normal heart rate response noted during stress.   LHC 03/04/20:  Left dominant coronary anatomy Normal left main Normal LAD that wraps around the left ventricular apex and supplies one half of the posterior interventricular groove territory. Large first diagonal/ramus intermedius that covers the distribution typically supplied by diagonal branches. Nondominant right coronary Dominant circumflex coronary artery.  The circumflex coronary is tortuous including the first obtuse marginal branch. Normal left ventricular end-diastolic pressure.  Ventriculography not performed to limit contrast exposure.     Past Medical History:  Diagnosis Date   ACS (acute coronary syndrome) (HCC) 07/31/2021   Adhesive capsulitis of left shoulder 12/16/2016   Asthma without status  asthmaticus 02/06/2021   At risk for falls 02/04/2019   Atrial fibrillation (HCC) 09/01/2014   Last Assessment & Plan:  Formatting of this note might be different from the original. A:  Chronic.  Sinus rhythm at this time.  States she was told this during admission at [facility] in the past. P:  On baby aspirin at home will continue.  Low dose metoprolol started.   Bipolar 1 disorder, depressed, moderate (HCC) 02/18/2020   Capsulitis of left shoulder 10/20/2021   Chronic anticoagulation 02/20/2020   Chronic atrial fibrillation (HCC) 02/18/2020   Chronic headache 10/11/2013   Chronic interstitial cystitis 12/28/2005   Chronic migraine without aura, intractable, without status migrainosus 06/24/2017   Colon cancer screening 04/15/2016   Last Assessment & Plan:  Formatting of this note might be different from the original. Will schedule for colonoscopy   Depression, recurrent (HCC) 02/18/2020   Diabetes mellitus (HCC)    Dyslipidemia 02/20/2020   GAD (generalized anxiety disorder) 02/18/2020   GERD (gastroesophageal reflux disease) 11/05/2021   History of atrial fibrillation 06/24/2017   History of posttraumatic stress disorder (PTSD) 10/07/2016   Hyperosmolarity due to secondary diabetes mellitus (HCC) 02/06/2021   Impaired mobility and activities of daily living 11/14/2015   Irritable bowel syndrome (IBS)    Junctional tachycardia (HCC) 11/05/2021   Kidney stone 02/06/2021   Migraine without aura, not refractory 02/06/2021   Mixed hyperlipidemia 04/06/2021   Last Assessment & Plan:  Formatting of this note might be different from the original. Due for labs with PCP in November   Morbid obesity (HCC) 12/16/2016   Paroxysmal supraventricular tachycardia (HCC) 11/05/2021   Pneumonia, organism unspecified(486) 11/21/2002   Polyneuropathy due to type 2 diabetes mellitus (HCC) 02/06/2021   Primary hypertension 04/06/2021   Last Assessment & Plan:  Formatting of this note might be different  from the original. Controlled   Refractory migraine with aura 02/06/2021   Refractory migraine without aura 02/06/2021   Renal colic on left side 04/22/2017   Stage 3 chronic kidney disease (HCC) 02/20/2020   Unspecified adverse effect of other drug, medicinal and biological substance(995.29) 02/17/2006   Urinary tract obstruction 02/06/2021    Past Surgical History:  Procedure Laterality Date   ABDOMINAL HYSTERECTOMY     Fibroids   ATRIAL FIBRILLATION ABLATION     ATRIAL FIBRILLATION ABLATION N/A 10/27/2022   Procedure: ATRIAL FIBRILLATION ABLATION;  Surgeon: Regan Lemming, MD;  Location: [facility] INVASIVE CV LAB;  Service: Cardiovascular;  Laterality: N/A;   CARDIAC CATHETERIZATION     CARDIOVERSION     CESAREAN SECTION     KNEE SURGERY     LEFT HEART CATH AND CORONARY ANGIOGRAPHY N/A 03/04/2020   Procedure: LEFT HEART CATH AND CORONARY ANGIOGRAPHY;  Surgeon: Lyn Records, MD;  Location: [facility] INVAS[facility] CV LAB;  Service: Cardiovascular;  Laterality: N/A;   ROTATOR CUFF REPAIR  SVT ABLATION N/A 05/04/2023   Procedure: SVT ABLATION;  Surgeon: Regan Lemming, MD;  Location: [facility] INVASIVE CV LAB;  Service: Cardiovascular;  Laterality: N/A;    MEDICATIONS: No current facility-administered medications for this encounter.    acetaminophen (TYLENOL) 500 MG tablet   atorvastatin (LIPITOR) 80 MG tablet   blood glucose meter kit and supplies   botulinum toxin Type A (BOTOX) 200 units injection   budesonide-formoterol (SYMBICORT) 80-4.5 MCG/ACT inhaler   buPROPion (WELLBUTRIN XL) 300 MG 24 hr tablet   diltiazem (CARDIZEM CD) 120 MG 24 hr capsule   diltiazem (CARDIZEM) 30 MG tablet   ELIQUIS 5 MG TABS tablet   EPINEPHrine 0.3 mg/0.3 mL IJ SOAJ injection   escitalopram (LEXAPRO) 10 MG tablet   ezetimibe (ZETIA) 10 MG tablet   FARXIGA 10 MG TABS tablet   fenofibrate 160 MG tablet   fluconazole (DIFLUCAN) 150 MG tablet   fluticasone (FLONASE) 50 MCG/ACT nasal spray    Fremanezumab-vfrm (AJOVY) 225 MG/1.5ML SOAJ   levalbuterol (XOPENEX HFA) 45 MCG/ACT inhaler   levocetirizine (XYZAL) 5 MG tablet   lidocaine (LIDODERM) 5 %   LORazepam (ATIVAN) 0.5 MG tablet   Lurasidone HCl 120 MG TABS   magnesium oxide (MAG-OX) 400 (240 Mg) MG tablet   metoCLOPramide (REGLAN) 10 MG tablet   metoprolol succinate (TOPROL-XL) 100 MG 24 hr tablet   metoprolol tartrate (LOPRESSOR) 25 MG tablet   MOUNJARO 12.5 MG/0.5ML Pen   Multiple Vitamins-Minerals (MULTIVITAMIN WITH MINERALS) tablet   nitroGLYCERIN (NITROSTAT) 0.4 MG SL tablet   ondansetron (ZOFRAN-ODT) 4 MG disintegrating tablet   ONETOUCH VERIO test strip   pantoprazole (PROTONIX) 40 MG tablet   QUEtiapine (SEROQUEL) 300 MG tablet   Rimegepant Sulfate (NURTEC) 75 MG TBDP   topiramate (TOPAMAX) 50 MG tablet    Shonna Chock, PA-C Surgical Short Stay/Anesthesiology [facility] Phone [phone] [place] Phone [phone] 10/31/2023 7:59 PM

## 2023-11-01 ENCOUNTER — Other Ambulatory Visit: Payer: Self-pay | Admitting: Family Medicine

## 2023-11-01 ENCOUNTER — Telehealth: Payer: Self-pay | Admitting: *Deleted

## 2023-11-01 ENCOUNTER — Ambulatory Visit: Payer: Medicare (Managed Care) | Admitting: Behavioral Health

## 2023-11-01 NOTE — Telephone Encounter (Signed)
 Pt is scheduled for tele preop appt 11/02/23 @ 9:20. Med rec and consent are done.   I will update all parties involved pt has tele preop appt 11/02/23 @ 9:20.

## 2023-11-01 NOTE — Telephone Encounter (Signed)
 Called the patient discussed options with her including possibly keeping the current date 3//25 or reschedule get to work with her calendar.  I also reminded her that we want her off the Eliquis for 2 days and off the [facility] for 7 days.  Asked her to call us back to let us know if 11/08/2023 is going to work.  Routing back to myself and to L. Raskin in office

## 2023-11-01 NOTE — Telephone Encounter (Signed)
 March 4th works for the patient.

## 2023-11-01 NOTE — Telephone Encounter (Signed)
 Thank you - I asked them to postpone based on her report to pre-op RN

## 2023-11-01 NOTE — Telephone Encounter (Signed)
 Pt is scheduled for tele preop appt 11/02/23 @ 9:20. Med rec and consent are done.      Patient Consent for Virtual Visit        Paula Stout has provided verbal consent on 11/01/2023 for a virtual visit (video or telephone).   CONSENT FOR VIRTUAL VISIT FOR:  Paula Stout  By participating in this virtual visit I agree to the following:  I hereby voluntarily request, consent and authorize [facility] and its employed or contracted physicians, physician assistants, nurse practitioners or other licensed health care professionals (the Practitioner), to provide me with telemedicine health care services (the "Services") as deemed necessary by the treating Practitioner. I acknowledge and consent to receive the Services by the Practitioner via telemedicine. I understand that the telemedicine visit will involve communicating with the Practitioner through live audiovisual communication technology and the disclosure of certain medical information by electronic transmission. I acknowledge that I have been given the opportunity to request an in-person assessment or other available alternative prior to the telemedicine visit and am voluntarily participating in the telemedicine visit.  I understand that I have the right to withhold or withdraw my consent to the use of telemedicine in the course of my care at any time, without affecting my right to future care or treatment, and that the Practitioner or I may terminate the telemedicine visit at any time. I understand that I have the right to inspect all information obtained and/or recorded in the course of the telemedicine visit and may receive copies of available information for a reasonable fee.  I understand that some of the potential risks of receiving the Services via telemedicine include:  Delay or interruption in medical evaluation due to technological equipment failure or disruption; Information transmitted may not be sufficient (e.g. poor  resolution of images) to allow for appropriate medical decision making by the Practitioner; and/or  In rare instances, security protocols could fail, causing a breach of personal health information.  Furthermore, I acknowledge that it is my responsibility to provide information about my medical history, conditions and care that is complete and accurate to the best of my ability. I acknowledge that Practitioner's advice, recommendations, and/or decision may be based on factors not within their control, such as incomplete or inaccurate data provided by me or distortions of diagnostic images or specimens that may result from electronic transmissions. I understand that the practice of medicine is not an exact science and that Practitioner makes no warranties or guarantees regarding treatment outcomes. I acknowledge that a copy of this consent can be made available to me via my patient portal [facility] MyChart), or I can request a printed copy by calling the office of [facility].    I understand that my insurance will be billed for this visit.   I have read or had this consent read to me. I understand the contents of this consent, which adequately explains the benefits and risks of the Services being provided via telemedicine.  I have been provided ample opportunity to ask questions regarding this consent and the Services and have had my questions answered to my satisfaction. I give my informed consent for the services to be provided through the use of telemedicine in my medical care

## 2023-11-01 NOTE — Telephone Encounter (Signed)
 Thank you :)

## 2023-11-01 NOTE — Telephone Encounter (Signed)
 Pharmacy please advise on holding Eliquis prior to robotic assisted navigational bronchoscopy scheduled for 11/08/2023. Thank you.

## 2023-11-01 NOTE — Telephone Encounter (Signed)
   Pre-operative Risk Assessment    Patient Name: Paula Stout  DOB: Feb 02, 1956 MRN: [id number]   Date of last office visit: 06/20/23 DR. CAMNITZ Date of next office visit: 12/13/23 DR. KRASOWSKI   Request for Surgical Clearance    Procedure:   robotic assisted navigational bronchoscopy by Dr. Levy Pupa on 11/08/23. She has a RUL lung nodule, suspicious for possible malignancy.   Date of Surgery:  Clearance 11/08/23                                Surgeon:  DR. Levy Pupa Surgeon's Group or Practice Name:  [facility] Phone number:  [phone] Shonna Chock, [facility] Fax number:  [phone]   Type of Clearance Requested:   - Medical  - Pharmacy:  Hold Apixaban (Eliquis) x 2 DAYS PRIOR   Type of Anesthesia:  General    Additional requests/questions:    Elpidio Anis   11/01/2023, 9:15 AM

## 2023-11-01 NOTE — Telephone Encounter (Signed)
   Name: Paula Stout  DOB: 11-02-1955  MRN: [id number]  Primary Cardiologist: Gypsy Balsam, MD   Preoperative team, please contact this patient and set up a phone call appointment for further preoperative risk assessment. Please obtain consent and complete medication review. Thank you for your help.  I confirm that guidance regarding antiplatelet and oral anticoagulation therapy has been completed and, if necessary, noted below.  Per office protocol, patient can hold Eliquis 2 days prior to procedure.   I also confirmed the patient resides in the state of [place]. As per [facility] Medical Board telemedicine laws, the patient must reside in the state in which the provider is licensed.   Napoleon Form, Leodis Rains, NP 11/01/2023, 9:49 AM [facility]

## 2023-11-01 NOTE — Telephone Encounter (Signed)
 Patient with diagnosis of afib on Eliquis for anticoagulation.    Procedure: robotic assisted navigational bronchoscopy Date of procedure: 11/08/23   CHA2DS2-VASc Score = 3   This indicates a 3.2% annual risk of stroke. The patient's score is based upon: CHF History: 0 HTN History: 0 Diabetes History: 1 Stroke History: 0 Vascular Disease History: 0 Age Score: 1 Gender Score: 1   CrCl 35 mL/min Platelet count 326 K  Per office protocol, patient can hold Eliquis 2 days prior to procedure.     **This guidance is not considered finalized until pre-operative APP has relayed final recommendations.**

## 2023-11-01 NOTE — Telephone Encounter (Signed)
 To clarify - had to leave pt VM with the info below

## 2023-11-01 NOTE — Telephone Encounter (Signed)
-----   Message from Napoleon Form, Leodis Rains sent at 11/01/2023  7:02 AM EST ----- Regarding: FW: preop? OR 3/4  ----- Message ----- From: Elizebeth Koller Sent: 10/31/2023   8:37 PM EST To: Cv Div Preop Subject: preop? OR 3/4                                  [facility] Pre-op APP Pool,   Hi, I'm one of the PAs with [facility] Pre-Anesthesia Testing.  Daly Whipkey is scheduled for robotic assisted navigational bronchoscopy by Dr. Levy Pupa on 11/08/23. She has a RUL lung nodule, suspicious for possible malignancy. She will require general anesthesia.   She is followed by Dr. Elberta Fortis and Dr. Bing Matter. She had normal, tortuous coronaries in 2021 and non-ischemic stress test in 2023. She had afib and SVT ablations in 2024. She was still having some tachycardia spells when she saw Dr. Elberta Fortis in October.  He added Cardizem to her Toprol regimen. Since then she was hospitalized for syncope in January while in [place] (see Care Everywhere).  Syncope was thought likely due to orthostatic hypotension.  She reports maybe two other episodes since then, last 10/13/23. Her nephrologist Dr. Kathrene Bongo reportedly discontinued her Cardizem on 10/20/23 due to continued hypotension. She remains on Toprol 100 mg BID. Home BP reported as ~ 110/82. HR primarily 70-80s, but says she continues to have a couple episodes a week of tachycardia up to ~ 150 bpm, lasting up to a minute.  She says this is not changed since her last visit with Dr. Elberta Fortis.  Dr. Bing Matter updated a Zio monitor and echo in 05/2023, although most recent echo was in 09/2023 after syncope and showed moderate-severe concentric LVH, LVEF 60-65%, trace AR, moderate TR, small pericardia effusion.  She said she has not required nitroglycerin in a while.   Dr. Delton Coombes has asked that she hold Eliquis for 2 days prior to surgery.    It seems syncope was felt likely from orthostatic hypotension, and Cardizem was discontinued about 2 weeks ago; However, since syncope was  new since her cardiology visits, anesthesiologist Dr. Bradley Ferris wanted me to reach out to see if her office visit(s) need to be moved up before her procedure--or if she needs to be set up for a preoperative phone call. I did tell Ms. Rajan that I would be reaching out.    Let me know if you have any questions or comments.  Thanks, Shonna Chock, PA-C Surgical Short Stay/Anesthesiology [facility] Phone [phone] [employer] Phone [phone] 10/31/2023 8:36 PM

## 2023-11-02 ENCOUNTER — Ambulatory Visit: Payer: Medicare (Managed Care) | Attending: Cardiovascular Disease | Admitting: Nurse Practitioner

## 2023-11-02 ENCOUNTER — Ambulatory Visit: Payer: Medicare (Managed Care) | Admitting: Pharmacist

## 2023-11-02 DIAGNOSIS — Z01810 Encounter for preprocedural cardiovascular examination: Secondary | ICD-10-CM

## 2023-11-02 DIAGNOSIS — I471 Supraventricular tachycardia, unspecified: Secondary | ICD-10-CM

## 2023-11-02 DIAGNOSIS — E1165 Type 2 diabetes mellitus with hyperglycemia: Secondary | ICD-10-CM

## 2023-11-02 NOTE — Progress Notes (Unsigned)
 Electrophysiology Clinic Note    Date:  11/03/2023  Patient ID:  Paula Stout, Paula Stout October 26, 1955, MRN [id number] PCP:  Sharlene Dory, DO  Cardiologist:  Gypsy Balsam, MD Electrophysiologist: Regan Lemming, MD   Discussed the use of AI scribe software for clinical note transcription with the patient, who gave verbal consent to proceed.   Patient Profile    Chief Complaint: presyncope, pre-operative evaluation  History of Present Illness: Paula Stout is a 68 y.o. female with PMH notable for parox Afib, SVT, migraine, HTN, CKD - 3, T2DM, bipolar; seen today for [name], MD for cardiac clearance for bronch with general anesthesia  She is s/p AFib ablation w PVI on 10/2022.  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   She last saw Dr. Elberta Fortis 06/2023 where she continued to have palpitations that correlated to sinus tach. She c/o rapid heart rates with walking to mailbox or climbing a flight of stairs. She was on toprol 100mg  BID, and he added 240mg  dilt daily. She messaged clinic after appt that HR was dropping into 40s with chest discomfort. Dilt reduced to 120mg  daily.   She had episode of syncope 09/2023 with prodrome of dizziness, visual changes. She had positive orthostatics. At discharge, her dilt was stopped, toprol lowered to 25mg  daily. RUL lung nodule was incidentally found, suspicious for malignancy.   Today, she tells me that she has not had any further syncopal episodes since stopping the diltiazem. She is frequently dizzy with position changes and walking. She continues to take toprol 100mg  BID. She has had palpitation episodes, averages about 1 per week that lasts a few minutes. She has not taken any PRN lopressor recently.  She does not eat or drink regularly, is not hungry. She is on mountjaro and lost 50lbs. She eats dinner daily, but no other meals or snacks throughout the day. She is not dizzy or LH in the evenings after dinner.     She is planning robotic-assisted bronch for lung nodule with general anesthesia. During pre-operative eval, she noted ongoing presyncope episodes so an in-person evaluation was recommended.     AAD History: None     ROS:  Please see the history of present illness. All other systems are reviewed and otherwise negative.    Physical Exam    VS:  BP (!) 87/61 (BP Location: Left Arm, Patient Position: Sitting, Cuff Size: Normal)   Pulse 72   Ht 5\' 6"  (1.676 m)   Wt 173 lb 6 oz (78.6 kg)   SpO2 97%   BMI 27.98 kg/m  BMI: Body mass index is 27.98 kg/m.  Orthostatic VS for the past 24 hrs (Last 3 readings):  BP- Lying Pulse- Lying BP- Sitting Pulse- Sitting BP- Standing at 0 minutes Pulse- Standing at 0 minutes  11/03/23 1418 (!) 89/60 73 (!) 88/60 72 (!) 58/48 78     Wt Readings from Last 3 Encounters:  11/03/23 173 lb 6 oz (78.6 kg)  10/28/23 175 lb (79.4 kg)  10/28/23 175 lb (79.4 kg)     GEN- The patient is well appearing, alert and oriented x 3 today.   Lungs- Clear to ausculation bilaterally, normal work of breathing.  Heart- Regular rate and rhythm, no murmurs, rubs or gallops Extremities- No peripheral edema, warm, dry    Studies Reviewed   Previous EP, cardiology notes.    EKG is ordered. Personal review of EKG from today shows:    EKG Interpretation Date/Time:  Thursday November 03 2023 14:16:09 EST Ventricular Rate:  72 PR Interval:  192 QRS Duration:  92 QT Interval:  396 QTC Calculation: 433 R Axis:   -31  Text Interpretation: Normal sinus rhythm Left axis deviation Confirmed by Sherie Don [phone]) on 11/03/2023 2:29:29 PM    TTE, 06/24/2023  1. Left ventricular ejection fraction, by estimation, is 60 to 65%. The left ventricle has normal function. The left ventricle has no regional wall motion abnormalities. There is mild left ventricular hypertrophy. Left ventricular diastolic parameters  were normal.   2. Right ventricular systolic function  is normal. The right ventricular size is normal.   3. The mitral valve is normal in structure. No evidence of mitral valve regurgitation. No evidence of mitral stenosis.   4. The aortic valve is normal in structure. Aortic valve regurgitation is trivial. No aortic stenosis is present.   5. The inferior vena cava is normal in size with greater than 50% respiratory variability, suggesting right atrial pressure of 3 mmHg.   Comparison(s): Echocardiogram done 12/17/21 showed an EF of 50-55%.   Long term monitor, 06/14/2023 Patient had a min HR of 56 bpm, max HR of 146 bpm, and avg HR of 83 bpm. Predominant underlying rhythm was Sinus Rhythm. 6 Supraventricular Tachycardia runs occurred, the run with the fastest interval lasting 9 beats with a max rate of 146 bpm (avg 127  bpm); the run with the fastest interval was also the longest. Isolated SVEs were occasional (1.0%, 16969), SVE Couplets were rare (<1.0%, 42), and no SVE Triplets were present. Some episodes of Supraventricular Tachycardia may be possible Atrial  Tachycardia with variable block. Isolated VEs were rare (<1.0%), VE Couplets were rare (<1.0%), and no VE Triplets were present. Ventricular Bigeminy and Trigeminy were present.   Summary and conclusions: 6 episode of supraventricular tachycardia with the fastest episodes at rate of 127 total of 6 beats. Multiple triggered events for PVCs.     Assessment and Plan     #) syncope #) orthostatic hypotension #) parox afib #) SVT S/p AF ablation 10/2022, s/p AVNRT ablation 04/2023 She has hypotensive on presentation to visit today with positive orthostatics.  She is on mounjaro and has lost 70lbs by chart review  She is eating and drinking very little throughout the day. Recommended she eat small snacks and significantly increase her fluid intake throughout the day Reduce toprol to 50mg  daily Continue lopressor 25mg  PRN for tachypalpitations that last greater than 1 hour.  She may need  to stop mounjaro if her PO intake can not increase without GI distress  She has follow-up previously scheduled with Dr. Elberta Fortis in 2 weeks, recommended she keep that appt for futher evaluation of tachypalpitations and orthostatic hypotension.    #) Hypercoag d/t parox afib CHA2DS2-VASc Score = at least 3 [CHF History: 0, HTN History: 0, Diabetes History: 1, Stroke History: 0, Vascular Disease History: 0, Age Score: 1, Gender Score: 1].  Therefore, the patient's annual risk of stroke is 3.2 %.    Stroke ppx - 5mg  eliquis BID, appropriately dosed No bleeding concerns   #) pre-procedure evaluation #) lung nodule Planning for robotic assisted bronchoscopy with general anesthesia RCRI score = 1, indicating a 0.9% perioperative risk of major cardiac event No additional workup needed prior to surgery.         Current medicines are reviewed at length with the patient today.   The patient does not have concerns regarding her medicines.  The following changes were  made today:   REDUCE toprol to 50mg  daily  Labs/ tests ordered today include:  Orders Placed This Encounter  Procedures   EKG 12-Lead     Disposition: Follow up with Dr. Elberta Fortis  as scheduled    Signed, Sherie Don, NP  11/03/23  4:55 PM  Electrophysiology [facility]

## 2023-11-02 NOTE — Progress Notes (Signed)
 Virtual Visit via Telephone Note   Because of [name] co-morbid illnesses, she is at least at moderate risk for complications without adequate follow up.  This format is felt to be most appropriate for this patient at this time.  Due to technical limitations with video connection (technology), today's appointment will be conducted as an audio only telehealth visit, and Paula Stout verbally agreed to proceed in this manner.   All issues noted in this document were discussed and addressed.  No physical exam could be performed with this format.  Evaluation Performed:  Preoperative cardiovascular risk assessment _____________   Date:  11/02/2023   Patient ID:  Paula Stout, Paula Stout 1956-04-26, MRN [id number] Patient Location:  Home Provider location:   Office  Primary Care Provider:  Sharlene Dory, DO Primary Cardiologist:  Gypsy Balsam, MD  Chief Complaint / Patient Profile   68 y.o. y/o female with a h/o paroxysmal atrial fibrillation s/p ablation in 10/2022, SVT, syncope, orthostatic hypotension, hypertension, hyperlipidemia, CKD, type 2 diabetes, and lung nodule who is pending robotic assisted navigational bronchoscopy by Dr. Levy Pupa of [facility] on 11/08/23 and presents today for telephonic preoperative cardiovascular risk assessment.  History of Present Illness    Paula Stout is a 68 y.o. female who presents via audio/video conferencing for a telehealth visit today.  Pt was last seen in cardiology clinic on 06/20/2023 by Dr. Elberta Fortis.  At that time Paula Stout was doing well.  She was hospitalized in January 2025 following a syncopal episode.  It was felt that her syncope was likely related to orthostatic hypotension.  Diltiazem was discontinued. The patient is now pending procedure as outlined above. Since her last visit, she has continued to note intermittent dizziness, presyncope.  Past Medical History    Past Medical History:  Diagnosis  Date   Adhesive capsulitis of left shoulder 12/16/2016   Asthma without status asthmaticus 02/06/2021   At risk for falls 02/04/2019   Atrial fibrillation (HCC) 09/01/2014   Last Assessment & Plan:  Formatting of this note might be different from the original. A:  Chronic.  Sinus rhythm at this time.  States she was told this during admission at [facility] in the past. P:  On baby aspirin at home will continue.  Low dose metoprolol started.   Bipolar 1 disorder, depressed, moderate (HCC) 02/18/2020   Capsulitis of left shoulder 10/20/2021   Chronic anticoagulation 02/20/2020   Chronic atrial fibrillation (HCC) 02/18/2020   Chronic headache 10/11/2013   Chronic interstitial cystitis 12/28/2005   Chronic migraine without aura, intractable, without status migrainosus 06/24/2017   Colon cancer screening 04/15/2016   Last Assessment & Plan:  Formatting of this note might be different from the original. Will schedule for colonoscopy   Depression, recurrent (HCC) 02/18/2020   Diabetes mellitus (HCC)    Dyslipidemia 02/20/2020   GAD (generalized anxiety disorder) 02/18/2020   GERD (gastroesophageal reflux disease) 11/05/2021   H/O chest pain 07/31/2021   History of atrial fibrillation 06/24/2017   History of posttraumatic stress disorder (PTSD) 10/07/2016   Hyperosmolarity due to secondary diabetes mellitus (HCC) 02/06/2021   Impaired mobility and activities of daily living 11/14/2015   Irritable bowel syndrome (IBS)    Junctional tachycardia (HCC) 11/05/2021   Kidney stone 02/06/2021   Migraine without aura, not refractory 02/06/2021   Mixed hyperlipidemia 04/06/2021   Last Assessment & Plan:  Formatting of this note might be different from the original. Due for labs with PCP in November  Morbid obesity (HCC) 12/16/2016   Paroxysmal supraventricular tachycardia (HCC) 11/05/2021   Pneumonia, organism unspecified(486) 11/21/2002   Polyneuropathy due to type 2 diabetes mellitus (HCC)  02/06/2021   Primary hypertension 04/06/2021   Last Assessment & Plan:  Formatting of this note might be different from the original. Controlled   Refractory migraine with aura 02/06/2021   Refractory migraine without aura 02/06/2021   Renal colic on left side 04/22/2017   Stage 3 chronic kidney disease (HCC) 02/20/2020   Thyroid goiter    10/17/23 CT: right thyorid goiter with substernal extension, benign FNA 07/07/23   Unspecified adverse effect of other drug, medicinal and biological substance(995.29) 02/17/2006   Urinary tract obstruction 02/06/2021   Past Surgical History:  Procedure Laterality Date   ABDOMINAL HYSTERECTOMY     Fibroids   ATRIAL FIBRILLATION ABLATION     ATRIAL FIBRILLATION ABLATION N/A 10/27/2022   Procedure: ATRIAL FIBRILLATION ABLATION;  Surgeon: Regan Lemming, MD;  Location: [facility] INVASIVE CV LAB;  Service: Cardiovascular;  Laterality: N/A;   CARDIAC CATHETERIZATION     CARDIOVERSION     CESAREAN SECTION     KNEE SURGERY     LEFT HEART CATH AND CORONARY ANGIOGRAPHY N/A 03/04/2020   Procedure: LEFT HEART CATH AND CORONARY ANGIOGRAPHY;  Surgeon: Lyn Records, MD;  Location: [facility] INVASIVE CV LAB;  Service: Cardiovascular;  Laterality: N/A;   ROTATOR CUFF REPAIR     SVT ABLATION N/A 05/04/2023   Procedure: SVT ABLATION;  Surgeon: Regan Lemming, MD;  Location: [facility] INVASIVE CV LAB;  Service: Cardiovascular;  Laterality: N/A;    Allergies  Allergies  Allergen Reactions   Bee Venom Anaphylaxis   Onion Other (See Comments)    Respiratory Distress   Tetanus-Diphtheria Toxoids Td Anaphylaxis   Sumatriptan     Passed out, nose bleed    Zolpidem Other (See Comments)    Causes sleep walking   Asa [Aspirin] Nausea And Vomiting   Iodine Rash   Nsaids Nausea Only    Rash, hives and trouble breathing   Sulfa Antibiotics Rash   Vancomycin Rash    Other Reaction(s): STEVENS-JOHNSON    Home Medications    Prior to Admission medications   Medication  Sig Start Date End Date Taking? Authorizing Provider  acetaminophen (TYLENOL) 500 MG tablet Take 1,000 mg by mouth every 6 (six) hours as needed for mild pain.    [provider]  atorvastatin (LIPITOR) 80 MG tablet TAKE 1 TABLET DAILY 02/09/23   Sharlene Dory, DO  blood glucose meter kit and supplies One Touch Ultra  Check blood sugars once daily Dx: E11.9 Patient taking differently: 1 each by Other route See admin instructions. One Touch Ultra  Check blood sugars once daily Dx: E11.9 10/01/21   Sharlene Dory, DO  botulinum toxin Type A (BOTOX) 200 units injection Inject 155 units IM into multiple site in the face,neck and head once every 90 days Patient taking differently: Inject 155 Units into the muscle See admin instructions. Inject 155 units IM into multiple site in the face,neck and head once every 90 days 06/15/22   Drema Dallas, DO  budesonide-formoterol (SYMBICORT) 80-4.5 MCG/ACT inhaler Inhale 2 puffs into the lungs 2 (two) times daily. 08/11/23   Cobb, Ruby Cola, NP  buPROPion (WELLBUTRIN XL) 300 MG 24 hr tablet Take 1 tablet (300 mg total) by mouth daily. 10/19/23   Joan Flores, NP  diltiazem (CARDIZEM CD) 120 MG 24 hr capsule Take 1 capsule (  120 mg total) by mouth daily. 07/19/23   Camnitz, Andree Coss, MD  diltiazem (CARDIZEM) 30 MG tablet Take 1 tablet every 4 hours AS NEEDED for AFIB or SVT heart rate >95 as long as top BP >100. Patient not taking: Reported on 10/31/2023 12/03/22   Sherie Don, NP  ELIQUIS 5 MG TABS tablet TAKE 1 TABLET TWICE A DAY 02/02/23   Sharlene Dory, DO  EPINEPHrine 0.3 mg/0.3 mL IJ SOAJ injection Inject 0.3 mg into the muscle as needed for anaphylaxis. 11/17/20   Sharlene Dory, DO  escitalopram (LEXAPRO) 10 MG tablet Take 1 tablet (10 mg total) by mouth daily. 10/19/23   Joan Flores, NP  ezetimibe (ZETIA) 10 MG tablet TAKE 1 TABLET DAILY 02/03/23   Sharlene Dory, DO  FARXIGA 10 MG TABS tablet TAKE 1  TABLET DAILY 08/09/23   Sharlene Dory, DO  fenofibrate 160 MG tablet Take 1 tablet (160 mg total) by mouth daily. 09/21/23   Sharlene Dory, DO  fluconazole (DIFLUCAN) 150 MG tablet Take 1 tab, repeat in 72 hours if no improvement. Patient not taking: Reported on 10/31/2023 09/26/23   Sharlene Dory, DO  fluticasone [facility]) 50 MCG/ACT nasal spray Place 2 sprays into both nostrils daily. Patient not taking: Reported on 10/31/2023 01/29/23   Junie Spencer, FNP  Fremanezumab-vfrm (AJOVY) 225 MG/1.5ML SOAJ Inject 225 mg into the skin every 28 (twenty-eight) days. 07/28/23   Drema Dallas, DO  levalbuterol (XOPENEX HFA) 45 MCG/ACT inhaler Inhale 2 puffs into the lungs every 6 (six) hours as needed for wheezing or shortness of breath. Patient not taking: Reported on 11/01/2023 08/11/23   Noemi Chapel, NP  levocetirizine (XYZAL) 5 MG tablet TAKE 1 TABLET EVERY EVENING Patient taking differently: Take 5 mg by mouth every evening. 01/14/23   Sharlene Dory, DO  lidocaine (LIDODERM) 5 % Place 1 patch onto the skin daily. Remove & Discard patch within 12 hours or as directed by MD 10/14/23   Paula Stout, April, MD  LORazepam (ATIVAN) 0.5 MG tablet Take 1 tablet (0.5 mg total) by mouth every 8 (eight) hours. 10/19/23   Joan Flores, NP  Lurasidone HCl 120 MG TABS Take 1 tablet (120 mg total) by mouth at bedtime. 10/19/23   Joan Flores, NP  magnesium oxide (MAG-OX) 400 (240 Mg) MG tablet Take 400 mg by mouth daily.    [provider]  metoCLOPramide (REGLAN) 10 MG tablet Take 1 tablet (10 mg total) by mouth every 6 (six) hours. Patient not taking: Reported on 11/01/2023 06/30/23   Elayne Snare K, DO  metoprolol succinate (TOPROL-XL) 100 MG 24 hr tablet Take 1 tablet (100 mg total) by mouth 2 (two) times daily. Take with or immediately following a meal. 04/12/23   Camnitz, Andree Coss, MD  metoprolol tartrate (LOPRESSOR) 25 MG tablet Take 1 tablet (25 mg total) by  mouth every 4 (four) hours as needed (PALPITATIONS). Patient taking differently: Take 100 mg by mouth 2 (two) times daily. 08/24/22   Camnitz, Andree Coss, MD  MOUNJARO 12.5 MG/0.5ML Pen INJECT 12.5 MG UNDER THE SKIN WEEKLY 07/18/23   Sharlene Dory, DO  Multiple Vitamins-Minerals (MULTIVITAMIN WITH MINERALS) tablet Take 1 tablet by mouth daily. Daily Diabetes Health Pack - MVI + chromium, fish oil + vit D, magnesium, vit C, alpha lipoic acid w/ green tea    [provider]  nitroGLYCERIN (NITROSTAT) 0.4 MG SL tablet Place 1 tablet (0.4 mg total) under  the tongue every 5 (five) minutes as needed for chest pain. 03/16/23   Georgeanna Lea, MD  ondansetron (ZOFRAN-ODT) 4 MG disintegrating tablet PLACE ONE TABLET ON THE TONGUE AND ALLOW TO DISSOLVE EVERY 8 HOURS AS NEEDED FOR NAUSEA OR VOMITING 11/01/23   Wendling, Jilda Roche, DO  [facility] VERIO test strip USE DAILY TO CHECK BLOOD SUGAR 10/29/22   Carmelia Roller, Jilda Roche, DO  pantoprazole (PROTONIX) 40 MG tablet Take 1 tablet (40 mg total) by mouth daily. 11/22/22   Sharlene Dory, DO  QUEtiapine (SEROQUEL) 300 MG tablet Take 1 tablet (300 mg total) by mouth at bedtime. 10/19/23   Joan Flores, NP  Rimegepant Sulfate (NURTEC) 75 MG TBDP Take 1 tablet (75 mg total) by mouth as needed (take 1 tab at the earlist onset of a migraine. Max 1 tab in 24 hours). 04/04/23   Drema Dallas, DO  topiramate (TOPAMAX) 50 MG tablet TAKE 3 TABLETS TWICE A DAY 08/23/23   Sharlene Dory, DO    Physical Exam    Vital Signs:  Dorthie Santini does not have vital signs available for review today.  Given telephonic nature of communication, physical exam is limited. AAOx3. NAD. Normal affect.  Speech and respirations are unlabored.  Accessory Clinical Findings    None  Assessment & Plan    1.  Preoperative Cardiovascular Risk Assessment:  Given ongoing symptoms of dizziness, presyncope.  For, would recommend an office visit for  further evaluation prior to proceeding with surgery.  Patient was advised to monitor BP/heart rate.  Reviewed ED precautions.  Per office protocol, patient can hold Eliquis 2 days prior to procedure. Please resume Eliquis as soon as possible postprocedure, at the discretion of the surgeon.    A copy of this note will be routed to requesting surgeon.  Time:   Today, I have spent 5 minutes with the patient with telehealth technology discussing medical history, symptoms, and management plan.     Joylene Grapes, NP  11/02/2023, 8:38 AM

## 2023-11-02 NOTE — Progress Notes (Addendum)
 11/02/2023 Name: Paula Stout MRN: [id number] DOB: 02-24-1956  Chief Complaint  Patient presents with   Diabetes   Medication Management    Paula Stout is a 68 y.o. year old female.   They were referred to the pharmacist by their PCP for assistance in managing medication access and complex medication management.   Subjective: Patient has  been approved for Extra Help from Medicare. Medications are now $0, $4 or $11.20.   Patient reports affordability concerns with their medications: No  - improved since she was approved for LIS / Medicare Extra Help Patient reports access/transportation concerns to their pharmacy: No  Patient reports adherence concerns with their medications:  No  -     Type 2 DM:  Current therapy: Mounjaro 12.5mg  weekly (started 9/24 with 7.5mg  weekly) and Farxiga 10mg  daily (started 02/25/2023)  Previous medications tried: Ozempic - stopped due to nausea. Trulicity - stopped when [facility] started  Home blood glucose: Has started using Smart Log app on phone to record blood glucose 7 day average = 91 14 day average = 95 No 30 day average since she has only been using app for a few weeks  Mounjaro:  Starting weight = 214 lbs BMI = 34.62; A1c = 6.5% Current Weight at home today was 173 lbs(has lost 41lbs!)   Wt Readings from Last 3 Encounters:  11/08/23 171 lb (77.6 kg)  11/03/23 173 lb 6 oz (78.6 kg)  10/28/23 175 lb (79.4 kg)    CKD -  Recent serum creatinine = 2.00 - stable Patient is seeing Dr Kathrene Bongo with [facility].   Lisinopril was stopped 03/23/2023 by Dr Kathrene Bongo.  Dose of metformin was lowered due to changes in eGFR.    Hypertension / Afib:  Current therapy: diltiazem 120mg  daily and metoprolol succinate 100mg  twice a day Also keeps metoprolol 25mg  and diltiazem 30mg  on hand to use as needed for elevated HR > 95 as long as SBP > 100.   SVT Ablation completed 05/04/2023  Anticoagulation therapy: Eliquis 5mg  twice a  day (Age = 68 yo; Scr = 2.28; weight = 90.3 kg)  BP Readings from Last 3 Encounters:  11/08/23 90/61  11/03/23 (!) 87/61  10/28/23 117/84   CHA2DS2-VASc Score = 3  The patient's score is based upon: CHF History: 0 HTN History: 0 Diabetes History: 1 Stroke History: 0 Vascular Disease History: 0 Age Score: 1 Gender Score: 1   Objective:  Lab Results  Component Value Date   HGBA1C 6.4 05/31/2023    Lab Results  Component Value Date   CREATININE 2.00 (H) 10/28/2023   BUN 27 (H) 10/28/2023   NA 136 10/28/2023   K 3.7 10/28/2023   CL 107 10/28/2023   CO2 18 (L) 10/28/2023    Lab Results  Component Value Date   CHOL 127 05/31/2023   HDL 34.30 (L) 05/31/2023   LDLCALC 58 05/31/2023   TRIG 176.0 (H) 05/31/2023   CHOLHDL 4 05/31/2023    Medications Reviewed Today     Reviewed by Henrene Pastor, RPH-CPP (Pharmacist) on 11/02/23 at 1030  Med List Status: <None>   Medication Order Taking? Sig Documenting Provider Last Dose Status Informant  acetaminophen (TYLENOL) 500 MG tablet [id number]  Take 1,000 mg by mouth every 6 (six) hours as needed for mild pain. [provider]  Active Self  atorvastatin (LIPITOR) 80 MG tablet [id number] Yes TAKE 1 TABLET DAILY Sharlene Dory, DO Taking Active Self  blood glucose meter kit and supplies [id number] No  One Touch Ultra  Check blood sugars once daily Dx: E11.9  Patient not taking: Reported on 11/02/2023   Sharlene Dory, DO Not Taking Active Self  botulinum toxin Type A (BOTOX) 200 units injection [id number] Yes Inject 155 units IM into multiple site in the face,neck and head once every 90 days  Patient taking differently: Inject 155 Units into the muscle See admin instructions. Inject 155 units IM into multiple site in the face,neck and head once every 90 days   Drema Dallas, DO Taking Active Self  budesonide-formoterol (SYMBICORT) 80-4.5 MCG/ACT inhaler [id number] No Inhale 2 puffs into the lungs 2 (two) times  daily.  Patient not taking: Reported on 11/02/2023   Noemi Chapel, NP Not Taking Active   buPROPion (WELLBUTRIN XL) 300 MG 24 hr tablet [id number] Yes Take 1 tablet (300 mg total) by mouth daily. Joan Flores, NP Taking Active   Calcium Carbonate (CALCIUM 500 PO) [id number] Yes Take 500 mg by mouth daily. [provider] Taking Active   ELIQUIS 5 MG TABS tablet [id number] Yes TAKE 1 TABLET TWICE A DAY Sharlene Dory, DO Taking Active Self  EPINEPHrine 0.3 mg/0.3 mL IJ SOAJ injection [id number]  Inject 0.3 mg into the muscle as needed for anaphylaxis. Sharlene Dory, DO  Active Self  escitalopram (LEXAPRO) 10 MG tablet [id number] Yes Take 1 tablet (10 mg total) by mouth daily. Joan Flores, NP Taking Active   ezetimibe (ZETIA) 10 MG tablet [id number] Yes TAKE 1 TABLET DAILY Sharlene Dory, DO Taking Active Self  FARXIGA 10 MG TABS tablet [id number] Yes TAKE 1 TABLET DAILY Sharlene Dory, DO Taking Active   fenofibrate 160 MG tablet [id number] Yes Take 1 tablet (160 mg total) by mouth daily. Sharlene Dory, DO Taking Active   fluticasone (FLONASE) 50 MCG/ACT nasal spray [id number]  Place 2 sprays into both nostrils daily.  Patient not taking: Reported on 10/31/2023   Junie Spencer, FNP  Active Self  Fremanezumab-vfrm (AJOVY) 225 MG/1.5ML Ivory Broad [id number] Yes Inject 225 mg into the skin every 28 (twenty-eight) days. Drema Dallas, DO Taking Active   levalbuterol [facility] HFA) 45 MCG/ACT inhaler [id number] No Inhale 2 puffs into the lungs every 6 (six) hours as needed for wheezing or shortness of breath.  Patient not taking: Reported on 10/31/2023   Noemi Chapel, NP Not Taking Active   levocetirizine (XYZAL) 5 MG tablet [id number] Yes TAKE 1 TABLET EVERY EVENING Wendling, Jilda Roche, DO Taking Active Self  LORazepam (ATIVAN) 0.5 MG tablet [id number] Yes Take 1 tablet (0.5 mg total) by mouth every 8 (eight) hours. Joan Flores, NP Taking Active    Lurasidone HCl 120 MG TABS [id number] Yes Take 1 tablet (120 mg total) by mouth at bedtime. Joan Flores, NP Taking Active   magnesium oxide (MAG-OX) 400 (240 Mg) MG tablet [id number] Yes Take 400 mg by mouth daily. [provider] Taking Active Self  metoprolol succinate (TOPROL-XL) 100 MG 24 hr tablet [id number] Yes Take 1 tablet (100 mg total) by mouth 2 (two) times daily. Take with or immediately following a meal. Camnitz, Andree Coss, MD Taking Active Self  metoprolol tartrate (LOPRESSOR) 25 MG tablet [id number] No Take 1 tablet (25 mg total) by mouth every 4 (four) hours as needed (PALPITATIONS).  Patient not taking: Reported on 11/02/2023   Regan Lemming, MD Not Taking Active Self           Med Note [facility], [facility]  Mon Aug 08, 2023 11:30 AM)    Greggory Keen 12.5 MG/0.5ML Pen [id number] Yes INJECT 12.5 MG UNDER THE SKIN WEEKLY Sharlene Dory, DO Taking Active   Multiple Vitamins-Minerals (MULTIVITAMIN WITH MINERALS) tablet [id number] Yes Take 1 tablet by mouth daily. Daily Diabetes Health Pack - MVI + chromium, fish oil + vit D, magnesium, vit C, alpha lipoic acid w/ green tea [provider] Taking Active Self  nitroGLYCERIN (NITROSTAT) 0.4 MG SL tablet [id number]  Place 1 tablet (0.4 mg total) under the tongue every 5 (five) minutes as needed for chest pain. Georgeanna Lea, MD  Active Self  ondansetron (ZOFRAN-ODT) 4 MG disintegrating tablet [id number] Yes PLACE ONE TABLET ON THE TONGUE AND ALLOW TO DISSOLVE EVERY 8 HOURS AS NEEDED FOR NAUSEA OR VOMITING Sharlene Dory, DO Taking Active   [facility] VERIO test strip [id number] No USE DAILY TO CHECK BLOOD SUGAR  Patient not taking: Reported on 11/02/2023   Sharlene Dory, DO Not Taking Active Self  pantoprazole (PROTONIX) 40 MG tablet [id number] Yes Take 1 tablet (40 mg total) by mouth daily. Sharlene Dory, DO Taking Active Self  QUEtiapine (SEROQUEL) 300 MG tablet [id number] Yes Take 1  tablet (300 mg total) by mouth at bedtime. Joan Flores, NP Taking Active   Rimegepant Sulfate (NURTEC) 75 MG TBDP [id number] Yes Take 1 tablet (75 mg total) by mouth as needed (take 1 tab at the earlist onset of a migraine. Max 1 tab in 24 hours). Drema Dallas, DO Taking Active Self  topiramate (TOPAMAX) 50 MG tablet [id number] Yes TAKE 3 TABLETS TWICE A DAY Wendling, Jilda Roche, DO Taking Active               Assessment/Plan:  Type 2 DM with CKD: blood glucose and weight have improved since starting Mounjaro - Continue Mounjaro 12.5mg  weekly. Discussed that Mounjaro and medications like Mounjaro can cause nausea and vomiting. She should make sure to eat small meals and pay attention to feelings of fullness and not eat until she feels stuffed. We can lower dose if she nausea is bothersome.  - Reminded patient to hold Mounjaro 1 week prior to bronchoscopy.  - Continue Farxiga 10mg  daily.     Hypertension / Afib/ SVT:  Continue metoprolol 100mg  twice a day and diltiazem 120mg  daily.  Continue Eliquis 5mg  twice a day  Medication Management / Access:  Patient has qualified for Medicare Extra Help for 2025. Notes that medication are affordable.    Follow Up Plan: 3 to 4 months.   Henrene Pastor, PharmD Clinical Pharmacist [facility]

## 2023-11-03 ENCOUNTER — Ambulatory Visit: Payer: PRIVATE HEALTH INSURANCE | Admitting: Physical Therapy

## 2023-11-03 ENCOUNTER — Ambulatory Visit: Payer: Medicare (Managed Care) | Attending: Cardiology | Admitting: Cardiology

## 2023-11-03 ENCOUNTER — Encounter: Payer: Self-pay | Admitting: Cardiology

## 2023-11-03 VITALS — BP 87/61 | HR 72 | Ht 66.0 in | Wt 173.4 lb

## 2023-11-03 DIAGNOSIS — I471 Supraventricular tachycardia, unspecified: Secondary | ICD-10-CM | POA: Diagnosis not present

## 2023-11-03 DIAGNOSIS — I480 Paroxysmal atrial fibrillation: Secondary | ICD-10-CM

## 2023-11-03 DIAGNOSIS — R0609 Other forms of dyspnea: Secondary | ICD-10-CM | POA: Diagnosis not present

## 2023-11-03 DIAGNOSIS — Z01810 Encounter for preprocedural cardiovascular examination: Secondary | ICD-10-CM

## 2023-11-03 MED ORDER — METOPROLOL SUCCINATE ER 50 MG PO TB24: 50.0000 mg | ORAL_TABLET | Freq: Every day | ORAL | 3 refills | Status: DC

## 2023-11-03 NOTE — Patient Instructions (Signed)
 Medication Instructions:  Your physician recommends the following medication changes.  STOP TAKING: Metoprolol Succinate (TOPROL-XL) 100 MG   START TAKING: Metoprolol  Succinate ( TOPROL -XL ) 50 MG Daily   *If you need a refill on your cardiac medications before your next appointment, please call your pharmacy*   Lab Work: No labs ordered today  If you have labs (blood work) drawn today and your tests are completely normal, you will receive your results only by: MyChart Message (if you have MyChart) OR A paper copy in the mail If you have any lab test that is abnormal or we need to change your treatment, we will call you to review the results.   Testing/Procedures: No test ordered today    Follow-Up: At [facility], you and your health needs are our priority.  As part of our continuing mission to provide you with exceptional heart care, we have created designated Provider Care Teams.  These Care Teams include your primary Cardiologist (physician) and Advanced Practice Providers (APPs -  Physician Assistants and Nurse Practitioners) who all work together to provide you with the care you need, when you need it.  Your next appointment:   Keep scheduled appointment

## 2023-11-04 NOTE — Progress Notes (Signed)
 Bronch was rescheduled to 3/4. Please cancel appt with me on 3/7 and keep follow up with Alexandria Lodge, NP 3/19 to review bronch results.

## 2023-11-07 ENCOUNTER — Encounter (HOSPITAL_COMMUNITY): Payer: Self-pay | Admitting: Emergency Medicine

## 2023-11-07 NOTE — Progress Notes (Signed)
 SDW call  Patient was given pre-op instructions over the phone. Patient verbalized understanding of instructions provided.     PCP - Dr. Arva Chafe Cardiologist - Dr. Gypsy Balsam Pulmonary:    PPM/ICD - denies Device Orders - na Rep Notified - na   Chest x-ray - 07/26/2023 EKG -  11/03/2023 Stress Test - 3/30/20223 ECHO - 06/23/2023 Cardiac Cath - 03/04/2020  Sleep Study/sleep apnea/CPAP: denies  Type II diabetic Fasting Blood sugar range: 85-90 How often check sugars: 1-2 times/day Farxiga, states last dose 11/06/2023 Mounjaro, states last dose 10/30/2023   Blood Thinner Instructions: Eliquis, states last dose 10/30/2023 Aspirin Instructions:   ERAS Protcol - NPO  Anesthesia review: Yes, reviewed 10/31/2023   Patient denies shortness of breath, fever, cough and chest pain over the phone call  Your procedure is scheduled on Tuesday November 08, 2023  Report to [facility] Main Entrance "A" at  0530  A.M., then check in with the Admitting office.  Call this number if you have problems the morning of surgery:  [phone]   If you have any questions prior to your surgery date call [phone]: Open Monday-Friday 8am-4pm If you experience any cold or flu symptoms such as cough, fever, chills, shortness of breath, etc. between now and your scheduled surgery, please notify us at the above number    Remember:  Do not eat or drink after midnight the night before your surgery  Take these medicines the morning of surgery with A SIP OF WATER:  Lipitor, symbicort, wellbutrin, protonix, topamax, lexapro, zetia, fenofibrate, flonase, ativan, metoprolol  As needed: Tylenol, zofran  As of today, STOP taking any Aspirin (unless otherwise instructed by your surgeon) Aleve, Naproxen, Ibuprofen, Motrin, Advil, Goody's, BC's, all herbal medications, fish oil, and all vitamins.

## 2023-11-08 ENCOUNTER — Encounter (HOSPITAL_COMMUNITY)
Admission: RE | Disposition: A | Discharge status: Home / Self Care | Payer: Self-pay | Source: Home / Self Care | Attending: Emergency Medicine

## 2023-11-08 ENCOUNTER — Ambulatory Visit (HOSPITAL_COMMUNITY)
Admission: RE | Admit: 2023-11-08 | Discharge: 2023-11-08 | Disposition: A | Discharge status: Home / Self Care | Payer: Medicare (Managed Care) | Attending: Emergency Medicine | Admitting: Emergency Medicine

## 2023-11-08 ENCOUNTER — Telehealth: Payer: Self-pay

## 2023-11-08 ENCOUNTER — Other Ambulatory Visit: Payer: Self-pay

## 2023-11-08 ENCOUNTER — Ambulatory Visit (HOSPITAL_COMMUNITY): Payer: Medicare (Managed Care) | Admitting: Vascular Surgery

## 2023-11-08 ENCOUNTER — Ambulatory Visit (HOSPITAL_COMMUNITY): Payer: Medicare (Managed Care)

## 2023-11-08 ENCOUNTER — Ambulatory Visit (HOSPITAL_BASED_OUTPATIENT_CLINIC_OR_DEPARTMENT_OTHER): Payer: Medicare (Managed Care) | Admitting: Vascular Surgery

## 2023-11-08 ENCOUNTER — Encounter (HOSPITAL_COMMUNITY): Payer: Self-pay | Admitting: Emergency Medicine

## 2023-11-08 DIAGNOSIS — N189 Chronic kidney disease, unspecified: Secondary | ICD-10-CM | POA: Diagnosis not present

## 2023-11-08 DIAGNOSIS — E1122 Type 2 diabetes mellitus with diabetic chronic kidney disease: Secondary | ICD-10-CM | POA: Insufficient documentation

## 2023-11-08 DIAGNOSIS — Z7901 Long term (current) use of anticoagulants: Secondary | ICD-10-CM | POA: Diagnosis not present

## 2023-11-08 DIAGNOSIS — Z6827 Body mass index (BMI) 27.0-27.9, adult: Secondary | ICD-10-CM | POA: Diagnosis not present

## 2023-11-08 DIAGNOSIS — K219 Gastro-esophageal reflux disease without esophagitis: Secondary | ICD-10-CM | POA: Diagnosis not present

## 2023-11-08 DIAGNOSIS — R911 Solitary pulmonary nodule: Secondary | ICD-10-CM | POA: Diagnosis not present

## 2023-11-08 DIAGNOSIS — E1142 Type 2 diabetes mellitus with diabetic polyneuropathy: Secondary | ICD-10-CM | POA: Insufficient documentation

## 2023-11-08 DIAGNOSIS — I4891 Unspecified atrial fibrillation: Secondary | ICD-10-CM

## 2023-11-08 DIAGNOSIS — J45909 Unspecified asthma, uncomplicated: Secondary | ICD-10-CM | POA: Insufficient documentation

## 2023-11-08 DIAGNOSIS — I482 Chronic atrial fibrillation, unspecified: Secondary | ICD-10-CM | POA: Insufficient documentation

## 2023-11-08 DIAGNOSIS — Z7985 Long-term (current) use of injectable non-insulin antidiabetic drugs: Secondary | ICD-10-CM | POA: Diagnosis not present

## 2023-11-08 DIAGNOSIS — F319 Bipolar disorder, unspecified: Secondary | ICD-10-CM | POA: Insufficient documentation

## 2023-11-08 DIAGNOSIS — N183 Chronic kidney disease, stage 3 unspecified: Secondary | ICD-10-CM | POA: Diagnosis not present

## 2023-11-08 DIAGNOSIS — I129 Hypertensive chronic kidney disease with stage 1 through stage 4 chronic kidney disease, or unspecified chronic kidney disease: Secondary | ICD-10-CM | POA: Diagnosis not present

## 2023-11-08 DIAGNOSIS — G43709 Chronic migraine without aura, not intractable, without status migrainosus: Secondary | ICD-10-CM

## 2023-11-08 HISTORY — PX: BRONCHIAL BRUSHINGS: SHX5108

## 2023-11-08 HISTORY — PX: BRONCHIAL WASHINGS: SHX5105

## 2023-11-08 HISTORY — PX: BRONCHIAL BIOPSY: SHX5109

## 2023-11-08 HISTORY — DX: Nontoxic goiter, unspecified: E04.9

## 2023-11-08 HISTORY — PX: BRONCHIAL NEEDLE ASPIRATION BIOPSY: SHX5106

## 2023-11-08 LAB — GLUCOSE, CAPILLARY
Glucose-Capillary: 107 mg/dL — ABNORMAL HIGH (ref 70–99)
Glucose-Capillary: 81 mg/dL (ref 70–99)

## 2023-11-08 SURGERY — BRONCHOSCOPY, WITH BIOPSY USING ELECTROMAGNETIC NAVIGATION
Anesthesia: General | Laterality: Right

## 2023-11-08 MED ORDER — PHENYLEPHRINE HCL-NACL 20-0.9 MG/250ML-% IV SOLN
INTRAVENOUS | Status: DC | PRN
  Administered 2023-11-08: 30 ug/min via INTRAVENOUS

## 2023-11-08 MED ORDER — LACTATED RINGERS IV SOLN: INTRAVENOUS | Status: DC | PRN

## 2023-11-08 MED ORDER — FENTANYL CITRATE (PF) 100 MCG/2ML IJ SOLN
INTRAMUSCULAR | Status: AC
  Filled 2023-11-08: qty 2

## 2023-11-08 MED ORDER — INSULIN ASPART 100 UNIT/ML IJ SOLN: 0.0000 [IU] | INTRAMUSCULAR | Status: DC | PRN

## 2023-11-08 MED ORDER — CHLORHEXIDINE GLUCONATE 0.12 % MT SOLN
15.0000 mL | Freq: Once | OROMUCOSAL | Status: AC
  Administered 2023-11-08: 15 mL via OROMUCOSAL

## 2023-11-08 MED ORDER — MIDAZOLAM HCL 2 MG/2ML IJ SOLN
INTRAMUSCULAR | Status: DC | PRN
  Administered 2023-11-08: 2 mg via INTRAVENOUS

## 2023-11-08 MED ORDER — MIDAZOLAM HCL 2 MG/2ML IJ SOLN
INTRAMUSCULAR | Status: AC
  Filled 2023-11-08: qty 2

## 2023-11-08 MED ORDER — PROPOFOL 10 MG/ML IV BOLUS
INTRAVENOUS | Status: DC | PRN
  Administered 2023-11-08: 100 ug/kg/min via INTRAVENOUS
  Administered 2023-11-08: 120 mg via INTRAVENOUS

## 2023-11-08 MED ORDER — ROCURONIUM BROMIDE 10 MG/ML (PF) SYRINGE
PREFILLED_SYRINGE | INTRAVENOUS | Status: DC | PRN
  Administered 2023-11-08: 10 mg via INTRAVENOUS
  Administered 2023-11-08: 40 mg via INTRAVENOUS

## 2023-11-08 MED ORDER — DEXAMETHASONE SODIUM PHOSPHATE 10 MG/ML IJ SOLN
INTRAMUSCULAR | Status: DC | PRN
  Administered 2023-11-08: 10 mg via INTRAVENOUS

## 2023-11-08 MED ORDER — FENTANYL CITRATE (PF) 250 MCG/5ML IJ SOLN
INTRAMUSCULAR | Status: DC | PRN
  Administered 2023-11-08: 25 ug via INTRAVENOUS

## 2023-11-08 MED ORDER — SUGAMMADEX SODIUM 200 MG/2ML IV SOLN
INTRAVENOUS | Status: DC | PRN
  Administered 2023-11-08: 200 mg via INTRAVENOUS

## 2023-11-08 MED ORDER — ONABOTULINUMTOXINA 200 UNITS IJ SOLR: 155.0000 [IU] | INTRAMUSCULAR | Status: DC

## 2023-11-08 MED ORDER — SODIUM CHLORIDE 0.9 % IV SOLN: INTRAVENOUS | Status: DC

## 2023-11-08 MED ORDER — ACETAMINOPHEN 10 MG/ML IV SOLN: 1000.0000 mg | Freq: Once | INTRAVENOUS | Status: DC | PRN

## 2023-11-08 MED ORDER — ONDANSETRON HCL 4 MG/2ML IJ SOLN
INTRAMUSCULAR | Status: DC | PRN
  Administered 2023-11-08: 4 mg via INTRAVENOUS

## 2023-11-08 MED ORDER — FENTANYL CITRATE (PF) 100 MCG/2ML IJ SOLN
25.0000 ug | INTRAMUSCULAR | Status: DC | PRN
Start: 2023-11-08 — End: 2023-11-08

## 2023-11-08 MED ORDER — LIDOCAINE 2% (20 MG/ML) 5 ML SYRINGE
INTRAMUSCULAR | Status: DC | PRN
  Administered 2023-11-08: 60 mg via INTRAVENOUS

## 2023-11-08 NOTE — Transfer of Care (Signed)
 Immediate Anesthesia Transfer of Care Note  Patient: Paula Stout  Procedure(s) Performed: ROBOTIC ASSISTED NAVIGATIONAL BRONCHOSCOPY (Right) BRONCHOSCOPY, WITH NEEDLE ASPIRATION BIOPSY BRONCHOSCOPY, WITH BRUSH BIOPSY BRONCHOSCOPY, WITH BIOPSY  Patient Location: PACU  Anesthesia Type:General  Level of Consciousness: drowsy  Airway & Oxygen Therapy: Patient Spontanous Breathing and Patient connected to face mask oxygen  Post-op Assessment: report given to RN and VS stable  Post vital signs: Reviewed and stable  Last Vitals:  Vitals Value Taken Time  BP 92/60 11/08/23 0842  Temp 36.4 C 11/08/23 0842  Pulse 76 11/08/23 0845  Resp 22 11/08/23 0845  SpO2 99 % 11/08/23 0845  Vitals shown include unfiled device data.  Last Pain:  Vitals:   11/08/23 0842  PainSc: Asleep         Complications: No notable events documented.

## 2023-11-08 NOTE — Op Note (Signed)
 Video Bronchoscopy with Robotic Assisted Bronchoscopic Navigation   Date of Operation: 11/08/2023   Pre-op Diagnosis: Right lower lobe nodule  Post-op Diagnosis: Right lower lobe nodule  Surgeon: Levy Pupa  Assistants: None  Anesthesia: General endotracheal anesthesia  Operation: Flexible video fiberoptic bronchoscopy with robotic assistance and biopsies.  Estimated Blood Loss: Minimal  Complications: None  Indications and History: Paula Stout is a 68 y.o. female with history of CAD, A-fib, asthma, diabetes, CKD.  We have been following a focal nodular irregular density in the superior segment of the right lower lobe with serial imaging.  Recommendation made to achieve a tissue diagnosis via robotic assisted navigational bronchoscopy. The risks, benefits, complications, treatment options and expected outcomes were discussed with the patient.  The possibilities of pneumothorax, pneumonia, reaction to medication, pulmonary aspiration, perforation of a viscus, bleeding, failure to diagnose a condition and creating a complication requiring transfusion or operation were discussed with the patient who freely signed the consent.    Description of Procedure: The patient was seen in the Preoperative Area, was examined and was deemed appropriate to proceed.  The patient was taken to [facility] endoscopy room 3, identified as Paula Stout and the procedure verified as Flexible Video Fiberoptic Bronchoscopy.  A Time Out was held and the above information confirmed.   Prior to the date of the procedure a high-resolution CT scan of the chest was performed. Utilizing ION software program a virtual tracheobronchial tree was generated to allow the creation of distinct navigation pathways to the patient's parenchymal abnormalities. After being taken to the operating room general anesthesia was initiated and the patient  was orally intubated. The video fiberoptic bronchoscope was introduced via the  endotracheal tube and a general inspection was performed which showed normal right and left lung anatomy. Aspiration of the bilateral mainstems was completed to remove any remaining secretions. Robotic catheter inserted into patient's endotracheal tube.   Target #1 right lower lobe nodule: The distinct navigation pathways prepared prior to this procedure were then utilized to navigate to patient's lesion identified on CT scan. The robotic catheter was secured into place and the vision probe was withdrawn.  Lesion location was approximated using fluoroscopy.  Local registration and peripheral targeting was performed using Cios three-dimensional imaging. Under fluoroscopic guidance transbronchial needle brushings, transbronchial needle biopsies, and transbronchial forceps biopsies were performed to be sent for cytology and pathology.  Needle in lesion was confirmed using Cios.  A bronchioalveolar lavage was performed in the right lower lobe and sent for microbiology.  At the end of the procedure a general airway inspection was performed and there was no evidence of active bleeding. The bronchoscope was removed.  The patient tolerated the procedure well. There was no significant blood loss and there were no obvious complications. A post-procedural chest x-ray is pending.  Samples Target #1: 1. Transbronchial needle brushings from right lower lobe nodule 2. Transbronchial Wang needle biopsies from right lower lobe nodule 3. Transbronchial forceps biopsies from right lower lobe nodule 4. Bronchoalveolar lavage from right lower lobe    Plans:  The patient will be discharged from the PACU to home when recovered from anesthesia and after chest x-ray is reviewed. We will review the cytology, pathology and microbiology results with the patient when they become available. Outpatient followup will be with Saralyn Pilar, NP and Dr. Delton Coombes.    Levy Pupa, MD, PhD 11/08/2023, 8:33 AM [facility] [phone] or if no answer before 7:00PM call [phone] For any issues  after 7:00PM please call eLink [phone]

## 2023-11-08 NOTE — Telephone Encounter (Signed)
 PA needed for Ajovy.

## 2023-11-08 NOTE — Anesthesia Postprocedure Evaluation (Signed)
 Anesthesia Post Note  Patient: Paula Stout  Procedure(s) Performed: BRONCHOSCOPY, WITH BIOPSY USING ELECTROMAGNETIC NAVIGATION (Right) BRONCHOSCOPY, WITH NEEDLE ASPIRATION BIOPSY BRONCHOSCOPY, WITH BRUSH BIOPSY BRONCHOSCOPY, WITH BIOPSY IRRIGATION, BRONCHUS     Patient location during evaluation: PACU Anesthesia Type: General Level of consciousness: awake and alert Pain management: pain level controlled Vital Signs Assessment: post-procedure vital signs reviewed and stable Respiratory status: spontaneous breathing, nonlabored ventilation, respiratory function stable and patient connected to nasal cannula oxygen Cardiovascular status: blood pressure returned to baseline and stable Postop Assessment: no apparent nausea or vomiting Anesthetic complications: no   No notable events documented.  Last Vitals:  Vitals:   11/08/23 0930 11/08/23 1000  BP: (!) 88/62 90/61  Pulse: 70 66  Resp: 16 19  Temp:  36.4 C  SpO2: 96% 97%    Last Pain:  Vitals:   11/08/23 0842  PainSc: Asleep                 Earl Lites P Zayed Griffie

## 2023-11-08 NOTE — Interval H&P Note (Signed)
 History and Physical Interval Note:  11/08/2023 7:23 AM  Paula Stout  has presented today for surgery, with the diagnosis of RIGHT UPPER LOBE NODULE.  The various methods of treatment have been discussed with the patient and family. After consideration of risks, benefits and other options for treatment, the patient has consented to  Procedure(s): ROBOTIC ASSISTED NAVIGATIONAL BRONCHOSCOPY (Right) as a surgical intervention.  The patient's history has been reviewed, patient examined, no change in status, stable for surgery.  I have reviewed the patient's chart and labs.  Questions were answered to the patient's satisfaction.     Leslye Peer

## 2023-11-08 NOTE — Progress Notes (Signed)
 Patient placed in phase 2 at 1000. Awaiting Dr. Delton Coombes to discuss results of procedure at this time, otherwise patient appropriate for discharge.

## 2023-11-08 NOTE — Anesthesia Procedure Notes (Signed)
 Procedure Name: Intubation Date/Time: 11/08/2023 7:34 AM  Performed by: Debbe Odea, CRNAPre-anesthesia Checklist: Patient identified, Emergency Drugs available, Suction available and Patient being monitored Patient Re-evaluated:Patient Re-evaluated prior to induction Oxygen Delivery Method: Circle System Utilized Preoxygenation: Pre-oxygenation with 100% oxygen Induction Type: IV induction Ventilation: Mask ventilation without difficulty Laryngoscope Size: Miller and 2 Grade View: Grade I Tube type: Oral Tube size: 8.0 mm Number of attempts: 1 Airway Equipment and Method: Stylet Placement Confirmation: ETT inserted through vocal cords under direct vision, positive ETCO2 and breath sounds checked- equal and bilateral Secured at: 22 cm Tube secured with: Tape Dental Injury: Teeth and Oropharynx as per pre-operative assessment

## 2023-11-08 NOTE — Discharge Instructions (Addendum)
 Flexible Bronchoscopy, Care After This sheet gives you information about how to care for yourself after your test. Your doctor may also give you more specific instructions. If you have problems or questions, contact your doctor. Follow these instructions at home: Eating and drinking When your numbness is gone and your cough and gag reflexes have come back, you may: Eat only soft foods. Slowly drink liquids. When you get home after the test, go back to your normal diet. Driving Do not drive for 24 hours if you were given a medicine to help you relax (sedative). Do not drive or use heavy machinery while taking prescription pain medicine. General instructions  Take over-the-counter and prescription medicines only as told by your doctor. Return to your normal activities as told. Ask what activities are safe for you. Do not use any products that have nicotine or tobacco in them. This includes cigarettes and e-cigarettes. If you need help quitting, ask your doctor. Keep all follow-up visits as told by your doctor. This is important. It is very important if you had a tissue sample (biopsy) taken. Get help right away if: You have shortness of breath that gets worse. You get light-headed. You feel like you are going to pass out (faint). You have chest pain. You cough up: More than a little blood. More blood than before. Summary Do not eat or drink anything (not even water) for 2 hours after your test, or until your numbing medicine wears off. Do not use cigarettes. Do not use e-cigarettes. Get help right away if you have chest pain.  Please call our office for any questions or concerns.  [phone]. You can restart your Eliquis on 11/09/2023 You can restart your [facility] on your usual weekly schedule  This information is not intended to replace advice given to you by your health care provider. Make sure you discuss any questions you have with your health care provider. Document Released:  06/20/2009 Document Revised: 08/05/2017 Document Reviewed: 09/10/2016 Elsevier Patient Education  2020 [employer].

## 2023-11-09 ENCOUNTER — Telehealth: Payer: Self-pay

## 2023-11-09 ENCOUNTER — Other Ambulatory Visit (HOSPITAL_COMMUNITY): Payer: Self-pay

## 2023-11-09 LAB — FUNGUS STAIN

## 2023-11-09 LAB — ACID FAST SMEAR (AFB, MYCOBACTERIA): Acid Fast Smear: NEGATIVE

## 2023-11-09 LAB — FUNGAL STAIN REFLEX

## 2023-11-09 LAB — CYTOLOGY - NON PAP

## 2023-11-09 NOTE — Telephone Encounter (Signed)
 PA request has been Cancelled. New Encounter has been or will be created for follow up. For additional info see Pharmacy Prior Auth telephone encounter from 03/05.  Refill too soon

## 2023-11-09 NOTE — Telephone Encounter (Signed)
*  [facility]  Pharmacy Patient Advocate Encounter   Received notification from Pt Calls Messages that prior authorization for Ajovy is required/requested.   Insurance verification completed.   The patient is insured through [employer] .   Per test claim: Refill too soon. PA is not needed at this time. Medication was filled 10/30/2023. Next eligible fill date is 11/19/2023.

## 2023-11-10 ENCOUNTER — Ambulatory Visit: Payer: Medicare (Managed Care) | Admitting: Behavioral Health

## 2023-11-10 ENCOUNTER — Encounter (HOSPITAL_COMMUNITY): Payer: Self-pay | Admitting: Emergency Medicine

## 2023-11-10 DIAGNOSIS — F411 Generalized anxiety disorder: Secondary | ICD-10-CM

## 2023-11-10 DIAGNOSIS — F331 Major depressive disorder, recurrent, moderate: Secondary | ICD-10-CM | POA: Diagnosis not present

## 2023-11-10 LAB — CULTURE, BAL-QUANTITATIVE W GRAM STAIN: Gram Stain: NONE SEEN

## 2023-11-10 NOTE — Progress Notes (Signed)
 [facility] Counselor/Therapist Progress Note  Patient ID: Bill Mcvey, MRN: [id number],    Date: 11/10/2023  Time Spent: 45 minutes, 2 PM until 2:45 PM . The patient consented to the video teletherapy and was located in her home during this session. She is aware it is the responsibility of the patient to secure confidentiality on her end of the session. The provider was in a private home  for the duration of this session.      Treatment Type: Individual Therapy  Reported Symptoms: Anxiety, depression  Mental Status Exam: Appearance:  Casual     Behavior: Appropriate  Motor: Normal  Speech/Language:  Normal Rate  Affect: Appropriate  Mood: normal  Thought process: normal  Thought content:   WNL  Sensory/Perceptual disturbances:   WNL  Orientation: oriented to person, place, time/date, situation, day of week, month of year, and year  Attention: Good  Concentration: Good  Memory: WNL  Fund of knowledge:  Good  Insight:   Good  Judgment:  Good  Impulse Control: Good   Risk Assessment: Danger to Self:  No Self-injurious Behavior: No Danger to Others: No Duty to Warn:no Physical Aggression / Violence:No  Access to Firearms a concern: No  Gang Involvement:No   Subjective: This can of the patient's lungs showed everything was clean with the exception of what looked like might have been some scar tissue from pneumonia in the past but otherwise no cancer so all of the test have come back negative for cancer.  That is a big relief for the patient.  There has not been much change in the home environment for the patient.  Her daughter has multiple health issues so she is not doing much around the house.  The daughter has some expectations for the patient to do things although she knows that the patient has had some health issues.  The patient says she passed out twice while in [place] visiting the rest of her children and has done so twice now at 1 time falling down  the stairs backwards after blacking out spraining her ankle and bruising her tailbone.  They have reduced and change some medications thinking that may be contributing to the blackout but they are not sure.  She is meeting with her cardiologist again next week and hopes to get some more defined answers there.  She is being as careful as she can but has to go up and down the stairs at least a couple of times a day to let her dog out, get food etc.  Target date for being able to move to [place] is late spring or early summer and we talked about what those options might be both short and longer range as she has had those conversations with her daughters and son.  I encouraged her to continue to set boundaries and use coping skills as necessary for anxiety reduction.  She does contract for safety having no thoughts of hurting herself or anyone else. Diagnosis: Generalized anxiety disorder, major depressive disorder, moderate, recurrent Plan: I will meet with the patient weekly via care agility.  Treatment plan: We will use cognitive behavioral therapy as well as person centered and supportive therapy in addition to elements of dialectical behavior therapy to help reduce the patient's anxiety and depression by at least 50% with a target date of October 06, 2023.  Goals for improving depression may include having less sadness as indicated by patient report and scores on the PHQ-9, have improved mood and  return to a healthier level of functioning, identify causes including environment for depressed mood and learn ways to cope with depression especially those connected to her medical issues.  Interventions include using cognitive behavioral therapy to explore and replace thoughts and behaviors.  We will look at how depression is experienced in day-to-day living and encouraged sharing of feelings.  We will encourage the use of coping skills for management of depressive symptoms.  Goals for reducing anxiety are to  improve her ability to manage anxiety symptoms, better handle stress, identify causes for anxiety and explore ways to lower it, resolve the core conflicts contributing to anxiety as well as manage thoughts and worrisome thinking contributing to feelings of anxiety.  Interventions will include providing education about anxiety, facilitate problem solution skills to help her identify options for resolving stress, teach coping skills for managing anxiety as well as mindfulness and communication in terms of family to reduce her stress level, use cognitive behavior therapy to identify and change anxiety provoking thought and behavior patterns as well as teach distress tolerance and mindfulness skills for alleviating anxiety. Progress: 30% progress notes were reviewed with a new extended target date of April 04, 2024.  [name], [facility]                  [name], [facility]               [name], [facility]               [name], [facility]               [name], [facility]               [name], [facility]               [name], [facility]               [name], [facility]               [name], [facility]               [name], [facility]                     [name], [facility]               [name], [facility]               [name], [facility]               [name], [facility]               [name], [facility]               [name], [facility]               [name], [facility]               [name], [facility]                                    [name], [facility]

## 2023-11-11 ENCOUNTER — Telehealth: Payer: PRIVATE HEALTH INSURANCE | Admitting: Nurse Practitioner

## 2023-11-13 LAB — AEROBIC/ANAEROBIC CULTURE W GRAM STAIN (SURGICAL/DEEP WOUND): Culture: NO GROWTH

## 2023-11-14 ENCOUNTER — Ambulatory Visit: Payer: Medicare (Managed Care) | Attending: Cardiology | Admitting: Cardiology

## 2023-11-14 ENCOUNTER — Encounter: Payer: Self-pay | Admitting: Cardiology

## 2023-11-14 ENCOUNTER — Telehealth: Payer: Self-pay | Admitting: Emergency Medicine

## 2023-11-14 VITALS — BP 100/62 | HR 74 | Ht 66.0 in | Wt 179.1 lb

## 2023-11-14 DIAGNOSIS — I480 Paroxysmal atrial fibrillation: Secondary | ICD-10-CM | POA: Diagnosis not present

## 2023-11-14 DIAGNOSIS — I471 Supraventricular tachycardia, unspecified: Secondary | ICD-10-CM

## 2023-11-14 DIAGNOSIS — R55 Syncope and collapse: Secondary | ICD-10-CM | POA: Diagnosis not present

## 2023-11-14 DIAGNOSIS — D6869 Other thrombophilia: Secondary | ICD-10-CM

## 2023-11-14 NOTE — Telephone Encounter (Signed)
 Called patient to review her bronchoscopy results.  Right lower lobe brushings and needle biopsies show abundant acute inflammation, bronchial cells and macrophages without any evidence of malignancy.  Cultures were sent and are all pending.She has f/u w SG on 3/19.

## 2023-11-14 NOTE — Progress Notes (Signed)
  Electrophysiology Office Note:   Date:  11/14/2023  ID:  Paula Stout, DOB 22-Feb-1956, MRN [id number]  Primary Cardiologist: Gypsy Balsam, MD Primary Heart Failure: None Electrophysiologist: Kristoffer Bala Jorja Loa, MD    History of Present Illness:   Paula Stout is a 68 y.o. female with h/o atrial fibrillation, SVT seen today for routine electrophysiology followup.   Since last being seen in our clinic the patient reports continued episodes of syncope.  She has syncope mainly when she stands.  She also has episodes of syncope a few minutes after standing.  She was recently seen in clinic and her metoprolol dose was decreased.  She is trying to hydrate and eat more salty foods.  Since her dose was decreased, she has had no further episodes of syncope aside from the day of her bronchoscopy.  She is walking her house and had a syncopal episode.  She had not had anything to eat or drink that day.  she denies chest pain, palpitations, dyspnea, PND, orthopnea, nausea, vomiting, dizziness, , edema, weight gain, or early satiety.   Review of systems complete and found to be negative unless listed in HPI.   EP Information / Studies Reviewed:    EKG is not ordered today. EKG from 11/03/2023 reviewed which showed sinus rhythm        Risk Assessment/Calculations:    CHA2DS2-VASc Score = 3   This indicates a 3.2% annual risk of stroke. The patient's score is based upon: CHF History: 0 HTN History: 0 Diabetes History: 1 Stroke History: 0 Vascular Disease History: 0 Age Score: 1 Gender Score: 1             Physical Exam:   VS:  BP 100/62   Pulse 74   Ht 5\' 6"  (1.676 m)   Wt 179 lb 1.3 oz (81.2 kg)   SpO2 96%   BMI 28.90 kg/m    Wt Readings from Last 3 Encounters:  11/14/23 179 lb 1.3 oz (81.2 kg)  11/08/23 171 lb (77.6 kg)  11/03/23 173 lb 6 oz (78.6 kg)     GEN: Well nourished, well developed in no acute distress NECK: No JVD; No carotid bruits CARDIAC: Regular rate  and rhythm, no murmurs, rubs, gallops RESPIRATORY:  Clear to auscultation without rales, wheezing or rhonchi  ABDOMEN: Soft, non-tender, non-distended EXTREMITIES:  No edema; No deformity   ASSESSMENT AND PLAN:    1.  Paroxysmal atrial fibrillation: Post ablation 10/27/2022.  Currently on metoprolol.  She has had a few episodes of inappropriate sinus tachycardia.  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  No obvious recurrence  3.  Secondary hypercoagulable state: Currently on Eliquis for atrial fibrillation  4.  Recurrent syncope: Has had multiple episodes of syncope.  She recently had her metoprolol dose reduced.  She is also trying to hydrate and eat more salty foods.  She has not had an episode of syncope aside from the day of her bronchoscopy since her metoprolol was decreased.  Continue with current management.  I have encouraged further hydration and salt loading.  Follow up with Dr. Elberta Fortis in 6 months  Signed, Shamya Macfadden Jorja Loa, MD

## 2023-11-14 NOTE — Patient Instructions (Signed)
 Medication Instructions:  Your physician recommends that you continue on your current medications as directed. Please refer to the Current Medication list given to you today.  *If you need a refill on your cardiac medications before your next appointment, please call your pharmacy*   Lab Work: None ordered  If you have any lab test that is abnormal or we need to change your treatment, we will call you to review the results.   Testing/Procedures: None ordered   Follow-Up: At [facility], you and your health needs are our priority.  As part of our continuing mission to provide you with exceptional heart care, we have created designated Provider Care Teams.  These Care Teams include your primary Cardiologist (physician) and Advanced Practice Providers (APPs -  Physician Assistants and Nurse Practitioners) who all work together to provide you with the care you need, when you need it.  Your next appointment:   6 month(s)  The format for your next appointment:   In Person  Provider:   Loman Brooklyn, MD    Thank you for choosing [facility]!!   Dory Horn, RN [phone]

## 2023-11-16 ENCOUNTER — Encounter: Payer: Self-pay | Admitting: Behavioral Health

## 2023-11-16 ENCOUNTER — Ambulatory Visit: Payer: Medicare (Managed Care) | Admitting: Behavioral Health

## 2023-11-16 DIAGNOSIS — F411 Generalized anxiety disorder: Secondary | ICD-10-CM

## 2023-11-16 DIAGNOSIS — F331 Major depressive disorder, recurrent, moderate: Secondary | ICD-10-CM

## 2023-11-16 DIAGNOSIS — F319 Bipolar disorder, unspecified: Secondary | ICD-10-CM

## 2023-11-16 NOTE — Progress Notes (Signed)
 [facility] Counselor/Therapist Progress Note  Patient ID: Paula Stout, MRN: [id number],    Date: 11/16/2023  Time Spent: 58 minutes, 2 PM until 2:58 PM . The patient consented to the video teletherapy and was located in her home during this session. She is aware it is the responsibility of the patient to secure confidentiality on her end of the session. The provider was in a private home  for the duration of this session.      Treatment Type: Individual Therapy  Reported Symptoms: Anxiety, depression  Mental Status Exam: Appearance:  Casual     Behavior: Appropriate  Motor: Normal  Speech/Language:  Normal Rate  Affect: Appropriate  Mood: normal  Thought process: normal  Thought content:   WNL  Sensory/Perceptual disturbances:   WNL  Orientation: oriented to person, place, time/date, situation, day of week, month of year, and year  Attention: Good  Concentration: Good  Memory: WNL  Fund of knowledge:  Good  Insight:   Good  Judgment:  Good  Impulse Control: Good   Risk Assessment: Danger to Self:  No Self-injurious Behavior: No Danger to Others: No Duty to Warn:no Physical Aggression / Violence:No  Access to Firearms a concern: No  Gang Involvement:No   Subjective: The patient continues to try to find some middle ground with medication and health issues.  They reduced her metoprolol significantly but now she is becoming lightheaded and blacking out more.  It happened in the kitchen recently.  She is meeting with the pulmonologist again next week and with a cardiologist soon.  She is still having some increased heart rate.  She is still getting fatigued fairly quickly.  There is some tension in the home with her daughter having some medical procedures and health issues.  The daughter is telling the patient she cannot do as much and she needs the patient to do more.  She says her daughter's does not seem to be respectful of her health issues.  She is asking  her to do things such as pick up the dog's pee pads which were not relevant to the patient because she takes her dog outside and feeds her daughter's dogs daily.  I encouraged her to set very clear and healthy boundaries because she already She does contract for safety having no thoughts of hurting herself or anyone else. Diagnosis: Generalized anxiety disorder, major depressive disorder, moderate, recurrent Plan: I will meet with the patient weekly via care agility.  Treatment plan: We will use cognitive behavioral therapy as well as person centered and supportive therapy in addition to elements of dialectical behavior therapy to help reduce the patient's anxiety and depression by at least 50% with a target date of October 06, 2023.  Goals for improving depression may include having less sadness as indicated by patient report and scores on the PHQ-9, have improved mood and return to a healthier level of functioning, identify causes including environment for depressed mood and learn ways to cope with depression especially those connected to her medical issues.  Interventions include using cognitive behavioral therapy to explore and replace thoughts and behaviors.  We will look at how depression is experienced in day-to-day living and encouraged sharing of feelings.  We will encourage the use of coping skills for management of depressive symptoms.  Goals for reducing anxiety are to improve her ability to manage anxiety symptoms, better handle stress, identify causes for anxiety and explore ways to lower it, resolve the core conflicts contributing to anxiety as  well as manage thoughts and worrisome thinking contributing to feelings of anxiety.  Interventions will include providing education about anxiety, facilitate problem solution skills to help her identify options for resolving stress, teach coping skills for managing anxiety as well as mindfulness and communication in terms of family to reduce her stress  level, use cognitive behavior therapy to identify and change anxiety provoking thought and behavior patterns as well as teach distress tolerance and mindfulness skills for alleviating anxiety. Progress: 30% progress notes were reviewed with a new extended target date of April 04, 2024.  [name], [facility]                  [name], [facility]               [name], [facility]               [name], [facility]               [name], [facility]               [name], [facility]               [name], [facility]               [name], [facility]               [name], [facility]               [name], [facility]                     [name], [facility]               [name], [facility]               [name], [facility]               [name], [facility]               [name], [facility]               [name], [facility]               [name], [facility]               [name], [facility]                                    [name], [facility]               [name], [facility]

## 2023-11-18 ENCOUNTER — Ambulatory Visit: Payer: Medicare (Managed Care) | Admitting: Neurology

## 2023-11-22 ENCOUNTER — Ambulatory Visit: Payer: Medicare (Managed Care)

## 2023-11-22 NOTE — Therapy (Deleted)
 OUTPATIENT PHYSICAL THERAPY VESTIBULAR EVALUATION     Patient Name: Paula Stout MRN: [id number] DOB:Oct 19, 1955, 68 y.o., female Today's Date: 11/22/2023  END OF SESSION:   Past Medical History:  Diagnosis Date   Adhesive capsulitis of left shoulder 12/16/2016   Asthma without status asthmaticus 02/06/2021   At risk for falls 02/04/2019   Atrial fibrillation (HCC) 09/01/2014   Last Assessment & Plan:  Formatting of this note might be different from the original. A:  Chronic.  Sinus rhythm at this time.  States she was told this during admission at [facility] in the past. P:  On baby aspirin at home will continue.  Low dose metoprolol started.   Bipolar 1 disorder, depressed, moderate (HCC) 02/18/2020   Capsulitis of left shoulder 10/20/2021   Chronic anticoagulation 02/20/2020   Chronic atrial fibrillation (HCC) 02/18/2020   Chronic headache 10/11/2013   Chronic interstitial cystitis 12/28/2005   Chronic migraine without aura, intractable, without status migrainosus 06/24/2017   Colon cancer screening 04/15/2016   Last Assessment & Plan:  Formatting of this note might be different from the original. Will schedule for colonoscopy   Depression, recurrent (HCC) 02/18/2020   Diabetes mellitus (HCC)    Dyslipidemia 02/20/2020   GAD (generalized anxiety disorder) 02/18/2020   GERD (gastroesophageal reflux disease) 11/05/2021   H/O chest pain 07/31/2021   History of atrial fibrillation 06/24/2017   History of posttraumatic stress disorder (PTSD) 10/07/2016   Hyperosmolarity due to secondary diabetes mellitus (HCC) 02/06/2021   Impaired mobility and activities of daily living 11/14/2015   Irritable bowel syndrome (IBS)    Junctional tachycardia (HCC) 11/05/2021   Kidney stone 02/06/2021   Migraine without aura, not refractory 02/06/2021   Mixed hyperlipidemia 04/06/2021   Last Assessment & Plan:  Formatting of this note might be different from the original. Due for labs with PCP  in November   Morbid obesity (HCC) 12/16/2016   Paroxysmal supraventricular tachycardia (HCC) 11/05/2021   Pneumonia, organism unspecified(486) 11/21/2002   Polyneuropathy due to type 2 diabetes mellitus (HCC) 02/06/2021   Primary hypertension 04/06/2021   Last Assessment & Plan:  Formatting of this note might be different from the original. Controlled   Refractory migraine with aura 02/06/2021   Refractory migraine without aura 02/06/2021   Renal colic on left side 04/22/2017   Stage 3 chronic kidney disease (HCC) 02/20/2020   Thyroid goiter    10/17/23 CT: right thyorid goiter with substernal extension, benign FNA 07/07/23   Unspecified adverse effect of other drug, medicinal and biological substance(995.29) 02/17/2006   Urinary tract obstruction 02/06/2021   Past Surgical History:  Procedure Laterality Date   ABDOMINAL HYSTERECTOMY     Fibroids   ATRIAL FIBRILLATION ABLATION     ATRIAL FIBRILLATION ABLATION N/A 10/27/2022   Procedure: ATRIAL FIBRILLATION ABLATION;  Surgeon: Regan Lemming, MD;  Location: [facility];  Service: Cardiovascular;  Laterality: N/A;   BRONCHIAL BIOPSY  11/08/2023   Procedure: BRONCHOSCOPY, WITH BIOPSY;  Surgeon: Leslye Peer, MD;  Location: [facility] ENDOSCOPY;  Service: Pulmonary;;   BRONCHIAL BRUSHINGS  11/08/2023   Procedure: BRONCHOSCOPY, WITH BRUSH BIOPSY;  Surgeon: Leslye Peer, MD;  Location: [facility] ENDOSCOPY;  Service: Pulmonary;;   BRONCHIAL NEEDLE ASPIRATION BIOPSY  11/08/2023   Procedure: BRONCHOSCOPY, WITH NEEDLE ASPIRATION BIOPSY;  Surgeon: Leslye Peer, MD;  Location: [facility] ENDOSCOPY;  Service: Pulmonary;;   BRONCHIAL WASHINGS  11/08/2023   Procedure: IRRIGATION, BRONCHUS;  Surgeon: Leslye Peer, MD;  Location: [facility] ENDOSCOPY;  Service: Pulmonary;;   CARDIAC CATHETERIZATION     CARDIOVERSION     CESAREAN SECTION     KNEE SURGERY     LEFT HEART CATH AND CORONARY ANGIOGRAPHY N/A 03/04/2020   Procedure: LEFT HEART CATH AND CORONARY  ANGIOGRAPHY;  Surgeon: Lyn Records, MD;  Location: [facility];  Service: Cardiovascular;  Laterality: N/A;   ROTATOR CUFF REPAIR     SVT ABLATION N/A 05/04/2023   Procedure: SVT ABLATION;  Surgeon: Regan Lemming, MD;  Location: [facility];  Service: Cardiovascular;  Laterality: N/A;   Patient Active Problem List   Diagnosis Date Noted   Syncope 09/18/2023   Enlarged pulmonary artery (HCC) 08/11/2023   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   Right thyroid nodule 06/23/2023   Pulmonary nodule 1 cm or greater in diameter 06/08/2023   Hypercoagulable state due to paroxysmal atrial fibrillation (HCC) 11/24/2022   Pericarditis 10/27/2022   Junctional tachycardia (HCC) 11/05/2021   GERD (gastroesophageal reflux disease) 11/05/2021   Paroxysmal supraventricular tachycardia (HCC) 11/05/2021   Capsulitis of left shoulder 10/20/2021   ACS (acute coronary syndrome) (HCC) 07/31/2021   Primary hypertension 04/06/2021   Mixed hyperlipidemia 04/06/2021   Hyperglycemia due to type 2 diabetes mellitus (HCC) 02/06/2021   Hyperosmolarity due to secondary diabetes mellitus (HCC) 02/06/2021   Insomnia 02/06/2021   Kidney stone 02/06/2021   Polyneuropathy due to type 2 diabetes mellitus (HCC) 02/06/2021   Urinary tract obstruction 02/06/2021   Asthma without status asthmaticus 02/06/2021   Migraine without aura, not refractory 02/06/2021   Refractory migraine with aura 02/06/2021   Refractory migraine without aura 02/06/2021   Depressed bipolar I disorder (HCC) 02/06/2021   SVT (supraventricular tachycardia) (HCC)    Paroxysmal atrial fibrillation (HCC)    Bipolar disorder (HCC)    Diabetes mellitus without complication (HCC)    Diabetes mellitus due to underlying condition with unspecified complications (HCC) 04/14/2020   Anxiety    Depression    Diabetes mellitus (HCC)    Irritable bowel syndrome (IBS)    Dyslipidemia 02/20/2020   Chronic anticoagulation 02/20/2020    Stage 3 chronic kidney disease (HCC) 02/20/2020   Chest pain 02/19/2020   Hypertriglyceridemia 02/18/2020   Depression, recurrent (HCC) 02/18/2020   Bipolar 1 disorder, depressed, moderate (HCC) 02/18/2020   GAD (generalized anxiety disorder) 02/18/2020   Chronic atrial fibrillation (HCC) 02/18/2020   At risk for falls 02/04/2019   Chronic migraine without aura, intractable, without status migrainosus 06/24/2017   History of atrial fibrillation 06/24/2017   Renal colic on left side 04/22/2017   Adhesive capsulitis of left shoulder 12/16/2016   Morbid obesity (HCC) 12/16/2016   History of posttraumatic stress disorder (PTSD) 10/07/2016   Colon cancer screening 04/15/2016   Heartburn 04/15/2016   Atrial fibrillation (HCC) 09/01/2014   Obesity (BMI 30-39.9) 09/01/2014   Asthma 09/01/2014   Unstable angina (HCC) 09/01/2014   HLD (hyperlipidemia) 09/01/2014   Type 2 diabetes mellitus with obesity (HCC) 10/11/2013   Migraines 10/11/2013   Bipolar 1 disorder (HCC) 10/11/2013   Chronic headache 10/11/2013   Dysmetabolic syndrome X 03/20/2008   Unspecified adverse effect of other drug, medicinal and biological substance(995.29) 02/17/2006   Chronic interstitial cystitis 12/28/2005   Pneumonia, organism unspecified(486) 11/21/2002    PCP: Sharlene Dory REFERRING PROVIDER: Sharlene Dory  REFERRING DIAG: balance problem  THERAPY DIAG:  No diagnosis found.  ONSET DATE: ***  Rationale for Evaluation and Treatment: Rehabilitation  SUBJECTIVE:   SUBJECTIVE STATEMENT: ***  Pt accompanied by: {accompnied:27141}  PERTINENT HISTORY: ***  PAIN:  Are you having pain? {OPRCPAIN:27236}  PRECAUTIONS: {Therapy precautions:24002}  RED FLAGS: {PT Red Flags:29287}   WEIGHT BEARING RESTRICTIONS: {Yes ***/No:24003}  FALLS: Has patient fallen in last 6 months? {fallsyesno:27318}  LIVING ENVIRONMENT: Lives with: {OPRC lives with:25569::"lives with their  family"} Lives in: {Lives in:25570} Stairs: {opstairs:27293} Has following equipment at home: {Assistive devices:23999}  PLOF: {PLOF:24004}  PATIENT GOALS: ***  OBJECTIVE:  Note: Objective measures were completed at Evaluation unless otherwise noted.  DIAGNOSTIC FINDINGS: IMPRESSION: Normal brain MRI. No acute intracranial abnormality or findings to explain patient's symptoms.     Electronically Signed   By: Rise Mu M.D.   On: 07/29/2023 05:05    COGNITION: Overall cognitive status: {cognition:24006}   SENSATION: {sensation:27233}  EDEMA:  {edema:24020}  MUSCLE TONE:  {LE tone:25568}  DTRs:  {DTR SITE:24025}  POSTURE:  {posture:25561}  Cervical ROM:    {AROM/PROM:27142} A/PROM (deg) eval  Flexion   Extension   Right lateral flexion   Left lateral flexion   Right rotation   Left rotation   (Blank rows = not tested)  STRENGTH: ***  LOWER EXTREMITY MMT:   MMT Right eval Left eval  Hip flexion    Hip abduction    Hip adduction    Hip internal rotation    Hip external rotation    Knee flexion    Knee extension    Ankle dorsiflexion    Ankle plantarflexion    Ankle inversion    Ankle eversion    (Blank rows = not tested)  BED MOBILITY:  {Bed mobility:24027}  TRANSFERS: Assistive device utilized: {Assistive devices:23999}  Sit to stand: {Levels of assistance:24026} Stand to sit: {Levels of assistance:24026} Chair to chair: {Levels of assistance:24026} Floor: {Levels of assistance:24026}  RAMP: {Levels of assistance:24026}  CURB: {Levels of assistance:24026}  GAIT: Gait pattern: {gait characteristics:25376} Distance walked: *** Assistive device utilized: {Assistive devices:23999} Level of assistance: {Levels of assistance:24026} Comments: ***  FUNCTIONAL TESTS:  {Functional tests:24029}  PATIENT SURVEYS:  {rehab surveys:24030}  VESTIBULAR ASSESSMENT:  GENERAL OBSERVATION: ***   SYMPTOM BEHAVIOR:  Subjective  history: ***  Non-Vestibular symptoms: {nonvestibular symptoms:25260}  Type of dizziness: {Type of Dizziness:25255}  Frequency: ***  Duration: ***  Aggravating factors: {Aggravating Factors:25258}  Relieving factors: {Relieving Factors:25259}  Progression of symptoms: {DESC; BETTER/WORSE:18575}  OCULOMOTOR EXAM:  Ocular Alignment: {Ocular Alignment:25262}  Ocular ROM: {RANGE OF MOTION:21649}  Spontaneous Nystagmus: {Spontaneous nystagmus:25263}  Gaze-Induced Nystagmus: {gaze-induced nystagmus:25264}  Smooth Pursuits: {smooth pursuit:25265}  Saccades: {saccades:25266}  Convergence/Divergence: *** cm   FRENZEL - FIXATION SUPRESSED:  Ocular Alignment: {Ocular Alignment:25262}  Spontaneous Nystagmus: {Spontaneous nystagmus:25263}  Gaze-Induced Nystagmus: {gaze-induced nystagmus:25264}  Horizontal head shaking - induced nystagmus: {head shaking induced nystagmus:25267}  Vertical head shaking - induced nystagmus: {head shaking induced nystagmus:25267}  Positional tests: {Positional tests:25271}  Pressure tests: {frenzel pressure tests:25268}  VESTIBULAR - OCULAR REFLEX:   Slow VOR: {slow VOR:25290}  VOR Cancellation: {vor cancellation:25291}  Head-Impulse Test: {head impulse test:25272}  Dynamic Visual Acuity: {dynamic visual acuity:25273}   POSITIONAL TESTING: {Positional tests:25271}  MOTION SENSITIVITY:  Motion Sensitivity Quotient Intensity: 0 = none, 1 = Lightheaded, 2 = Mild, 3 = Moderate, 4 = Severe, 5 = Vomiting  Intensity  1. Sitting to supine   2. Supine to L side   3. Supine to R side   4. Supine to sitting   5. L Hallpike-Dix   6. Up from L    7. R Hallpike-Dix   8. Up  from R    9. Sitting, head tipped to L knee   10. Head up from L knee   11. Sitting, head tipped to R knee   12. Head up from R knee   13. Sitting head turns x5   14.Sitting head nods x5   15. In stance, 180 turn to L    16. In stance, 180 turn to R     OTHOSTATICS: {Exam;  orthostatics:31331}  FUNCTIONAL GAIT: {Functional tests:24029}                                                                                                                             TREATMENT DATE: ***   Canalith Repositioning:  {Canalith Repositioning:25283} Gaze Adaptation:  {gaze adaptation:25286} Habituation:  {habituation:25288} Other: ***  PATIENT EDUCATION: Education details: *** Person educated: {Person educated:25204} Education method: {Education Method:25205} Education comprehension: {Education Comprehension:25206}  HOME EXERCISE PROGRAM:  GOALS: Goals reviewed with patient? Yes  SHORT TERM GOALS: Target date: 2 weeks, 12/06/23  I HEP Baseline: Goal status: INITIAL    LONG TERM GOALS: Target date: ***  *** Baseline:  Goal status: {GOALSTATUS:25110}  2.  *** Baseline:  Goal status: {GOALSTATUS:25110}  3.  *** Baseline:  Goal status: {GOALSTATUS:25110}  4.  *** Baseline:  Goal status: {GOALSTATUS:25110}  5.  *** Baseline:  Goal status: {GOALSTATUS:25110}  6.  *** Baseline:  Goal status: {GOALSTATUS:25110}  ASSESSMENT:  CLINICAL IMPRESSION: Patient is a *** y.o. *** who was seen today for physical therapy evaluation and treatment for ***.   OBJECTIVE IMPAIRMENTS: {opptimpairments:25111}.   ACTIVITY LIMITATIONS: {activitylimitations:27494}  PARTICIPATION LIMITATIONS: {participationrestrictions:25113}  PERSONAL FACTORS: {Personal factors:25162} are also affecting patient's functional outcome.   REHAB POTENTIAL: {rehabpotential:25112}  CLINICAL DECISION MAKING: {clinical decision making:25114}  EVALUATION COMPLEXITY: {Evaluation complexity:25115}   PLAN:  PT FREQUENCY: {rehab frequency:25116}  PT DURATION: {rehab duration:25117}  PLANNED INTERVENTIONS: {rehab planned interventions:25118::"97110-Therapeutic exercises","97530- Therapeutic [phone]- Neuromuscular re-education","97535- Self UUVO","53664- Manual  therapy"}  PLAN FOR NEXT SESSION: ***   Anabelle Bungert L Elenna Spratling, PT 11/22/2023, 12:45 PM

## 2023-11-23 ENCOUNTER — Encounter: Payer: Self-pay | Admitting: Acute Care

## 2023-11-23 ENCOUNTER — Ambulatory Visit: Payer: Medicare (Managed Care) | Admitting: Behavioral Health

## 2023-11-23 ENCOUNTER — Ambulatory Visit (INDEPENDENT_AMBULATORY_CARE_PROVIDER_SITE_OTHER): Payer: Medicare (Managed Care)

## 2023-11-23 ENCOUNTER — Telehealth: Payer: Self-pay | Admitting: Acute Care

## 2023-11-23 ENCOUNTER — Ambulatory Visit (INDEPENDENT_AMBULATORY_CARE_PROVIDER_SITE_OTHER): Payer: Medicare (Managed Care) | Admitting: Acute Care

## 2023-11-23 VITALS — BP 113/76 | HR 73 | Ht 66.0 in | Wt 172.0 lb

## 2023-11-23 DIAGNOSIS — Z9889 Other specified postprocedural states: Secondary | ICD-10-CM

## 2023-11-23 DIAGNOSIS — F331 Major depressive disorder, recurrent, moderate: Secondary | ICD-10-CM

## 2023-11-23 DIAGNOSIS — R911 Solitary pulmonary nodule: Secondary | ICD-10-CM | POA: Diagnosis not present

## 2023-11-23 DIAGNOSIS — F411 Generalized anxiety disorder: Secondary | ICD-10-CM | POA: Diagnosis not present

## 2023-11-23 DIAGNOSIS — R0609 Other forms of dyspnea: Secondary | ICD-10-CM | POA: Diagnosis not present

## 2023-11-23 NOTE — Telephone Encounter (Signed)
 I have called the patient with her CXR results. CXR is normal, no active pulmonary disease. She verbalized understanding.

## 2023-11-23 NOTE — Patient Instructions (Addendum)
 It is good to see you today. I am glad you have been doing well since the procedure. We will do a CXR today to better evaluate why you are experiencing shortness of breath.  I will call you with the results. You biopsies were negative for malignancy , which is great news.  We will do a 3 month follow up CT chest to ensure the nodule is stable. This will be due 02/2024. You will get a call to get it scheduled closer to the time. You will follow up with me after the scan to review results. Continue using your albuterol inhaler as needed for shortness of breath or wheezing.  Call to be seen if cough and shortness of breath do not improve within the next 2 weeks.  Please contact office for sooner follow up if symptoms do not improve or worsen or seek emergency care

## 2023-11-23 NOTE — Progress Notes (Signed)
 [facility] Counselor/Therapist Progress Note  Patient ID: Greidy Sherard, MRN: [id number],    Date: 11/23/2023 Time Spent: 56 minutes, 2 PM until 2:56 PM . The patient consented to the video teletherapy and was located in her home during this session. She is aware it is the responsibility of the patient to secure confidentiality on her end of the session. The provider was in a private home  for the duration of this session.      Treatment Type: Individual Therapy  Reported Symptoms: Anxiety, depression  Mental Status Exam: Appearance:  Casual     Behavior: Appropriate  Motor: Normal  Speech/Language:  Normal Rate  Affect: Appropriate  Mood: normal  Thought process: normal  Thought content:   WNL  Sensory/Perceptual disturbances:   WNL  Orientation: oriented to person, place, time/date, situation, day of week, month of year, and year  Attention: Good  Concentration: Good  Memory: WNL  Fund of knowledge:  Good  Insight:   Good  Judgment:  Good  Impulse Control: Good   Risk Assessment: Danger to Self:  No Self-injurious Behavior: No Danger to Others: No Duty to Warn:no Physical Aggression / Violence:No  Access to Firearms a concern: No  Gang Involvement:No   Subjective: The patient was experiencing elevated heart rate as we started the session.  She was able to settle down after a few minutes but says that type of thing happens at least a couple of times per day.  Her cardiologist will not see her again for 6 months.  She met with her pulmonologist this morning and they wonder if some of the biopsy stirred up some of the breathing difficulties says there are also some cysts or nodules in her lungs which they are going to monitor every few months.  If they have growing the may have to remove them but if not that continue to watch them.  She is meeting with her PCP next week because she is concerned about how many episodes of elevated heart  rate and breathing difficulty she had.  There was a situation yesterday where if she had not been standing next to her son-in-law she probably would have fallen down.  She says it is just like everything goes black.  She remembers everything but she gets dizzy and cannot see.  She has to sit down or lay down as quickly as possible.  She acknowledges that she handles that okay but still has social anxiety.  We talked about the cognitive aspect of trying to challenge some of the thoughts driving her anxiety as well as skills to cope when she does has to go in public.  She has been having more panic attacks which she feels are at least in large way attributed to the medical issues but is doing all that she can.  She is drinking water constantly.  She is not eating as healthy as she can because they are not cooking as much at home.  Her daughter just had some surgery and the patient is not capable.  She is resting well.  She is reaching out to her children and other states as a way of support and comfort.  There is a stool in the kitchen where she can sit down and thankful for that because she has needed to several times because of the blacking out type of episodes.  She does contract for safety having no thoughts of hurting herself  or anyone else. Diagnosis: Generalized anxiety disorder, major depressive disorder, moderate, recurrent Plan: I will meet with the patient weekly via care agility.  Treatment plan: We will use cognitive behavioral therapy as well as person centered and supportive therapy in addition to elements of dialectical behavior therapy to help reduce the patient's anxiety and depression by at least 50% with a target date of October 06, 2023.  Goals for improving depression may include having less sadness as indicated by patient report and scores on the PHQ-9, have improved mood and return to a healthier level of functioning, identify causes including environment for depressed mood and learn  ways to cope with depression especially those connected to her medical issues.  Interventions include using cognitive behavioral therapy to explore and replace thoughts and behaviors.  We will look at how depression is experienced in day-to-day living and encouraged sharing of feelings.  We will encourage the use of coping skills for management of depressive symptoms.  Goals for reducing anxiety are to improve her ability to manage anxiety symptoms, better handle stress, identify causes for anxiety and explore ways to lower it, resolve the core conflicts contributing to anxiety as well as manage thoughts and worrisome thinking contributing to feelings of anxiety.  Interventions will include providing education about anxiety, facilitate problem solution skills to help her identify options for resolving stress, teach coping skills for managing anxiety as well as mindfulness and communication in terms of family to reduce her stress level, use cognitive behavior therapy to identify and change anxiety provoking thought and behavior patterns as well as teach distress tolerance and mindfulness skills for alleviating anxiety. Progress: 30% progress notes were reviewed with a new extended target date of April 04, 2024.  [name], [facility]                  [name], [facility]               [name], [facility]               [name],  [facility]               [name], [facility]               [name], [facility]               [name], [facility]               [name], [facility]               [name], [facility]               [name], [facility]                     [name], [facility]               [name], [facility]               [name], [facility]               [name],  [facility]               [name], [facility]               [name], [facility]               [name], [facility]  [name], [facility]                                    [name], [facility]               [name], [facility]    [name], [facility]

## 2023-11-23 NOTE — Progress Notes (Signed)
 History of Present Illness Paula Stout is a 68 y.o. female  never smoker with a history of coronary disease, chronic atrial fibrillation, migraines, asthma, GERD and rhinitis, diabetes, CKD.  Dr. Delton Coombes saw her for an abnormal CT scan of the chest in October 2024 that showed a focal nodular density with irregular borders in the superior segment of the right lower lobe 1.3 x 1.6 cm.  It was slightly less prominent on repeat CT done 07/27/2023 and it was  decided plan was to follow with serial imaging.   CT scan of the chest 10/17/2023 reviewed by me shows overall stable appearance of the irregular superior segmental right lower lobe nodule.  Looks quite similar to the scan done in September 2024   Pulmonary function testing 10/20/2023 was done and  showed grossly normal airflows without a bronchodilator response.  Normal lung volumes decreased diffusion capacity that corrects to the normal range when adjusted for alveolar volume   Because the right upper lobe nodule was concerning in appearance of a possible malignancy, Dr. Delton Coombes felt the best plan was for bronchoscopy with biopsies , then referral for surgery if malignancy is present. Pt. Underwent bronchoscopy with biopsies 11/08/2023. She is here for follow up after the procedure, and to review cytology.    11/23/2023 Pt. Present for follow up after bronchoscopy with biopsies. She states she has had a cough that will not go away since the procedure. The cough is non productive.She states she is not coughing much, but is just aware of a cough. She denies any bleeding , discolored secretions , fever, of adverse reaction to anesthesia.  She does complain of shortness of breath since the procedure. She states she has had shortness of breath in the past that she has always attributed to her heart issues. We will do a CXR today to better evaluate this.Her oxygen saturation was 100% after walking back to the exam room. She states she has been wheezing, she  feels this is due to her seasonal allergies. She is taking Xyzol for this. She is also using an albuterol inhaler. She is using her albuterol inhaler once or twice daily since her procedure. I have asked her to call to be seen if she doesn't have resolution of cough in 2-3 weeks so we can further evaluate.    Dr. Delton Coombes had called her with her results. They were negative for malignancy, + for abundant acute inflammation, bronchial cells and macrophages.  Tests CXR 04/24/2024 The heart size and mediastinal contours are within normal limits. Both lungs are clear. The visualized skeletal structures are unremarkable. No active cardiopulmonary disease.   Cytology 11/08/2023 A. LUNG, RLL, FINE NEEDLE ASPIRATION  BIOPSY:  - No malignant cells identified  - Benign bronchial cells and macrophages  - Abundant acute inflammation   B. LUNG, RLL, BRUSHING:  - No malignant cells identified  - Few benign bronchial cells and macrophages  - Predominantly blood   BAL was negative for growth thus far. Fungal is pending     Latest Ref Rng & Units 10/28/2023    9:34 PM 07/26/2023    7:41 PM 07/10/2023    7:48 PM  CBC  WBC 4.0 - 10.5 K/uL 6.5  7.6  8.1   Hemoglobin 12.0 - 15.0 g/dL 81.8  29.9  37.1   Hematocrit 36.0 - 46.0 % 34.3  37.6  38.3   Platelets 150 - 400 K/uL 326  300  295        Latest Ref  Rng & Units 10/28/2023    9:34 PM 10/20/2023   12:00 AM 07/26/2023    7:41 PM  BMP  Glucose 70 - 99 mg/dL 89   865   BUN 8 - 23 mg/dL 27   22   Creatinine 7.84 - 1.00 mg/dL 6.96   2.95   Sodium 284 - 145 mmol/L 136   136   Potassium 3.5 - 5.1 mmol/L 3.7   3.6   Chloride 98 - 111 mmol/L 107   109   CO2 22 - 32 mmol/L 18   16   Calcium 8.9 - 10.3 mg/dL 9.5  9.6     9.5      This result is from an external source.    BNP    Component Value Date/Time   BNP 62.1 05/15/2023 1952    ProBNP No results found for: "PROBNP"  PFT    Component Value Date/Time   FEV1PRE 2.30 10/20/2023 1316    FEV1POST 2.36 10/20/2023 1316   FVCPRE 3.12 10/20/2023 1316   FVCPOST 3.28 10/20/2023 1316   TLC 5.60 10/20/2023 1316   DLCOUNC 16.49 10/20/2023 1316   PREFEV1FVCRT 74 10/20/2023 1316   PSTFEV1FVCRT 72 10/20/2023 1316    DG Chest Port 1 View Result Date: 11/08/2023 CLINICAL DATA:  Status post bronchoscopy with lung biopsy EXAM: PORTABLE CHEST 1 VIEW COMPARISON:  07/26/2023 FINDINGS: Lung volumes are small there is linear atelectasis within the mid lung zones bilaterally. No superimposed confluent pulmonary infiltrate. No pneumothorax or pleural effusion. Cardiac size within normal limits. Pulmonary vascularity is normal. No acute bone abnormality. IMPRESSION: 1. Pulmonary hypoinflation. No pneumothorax. Electronically Signed   By: Helyn Numbers M.D.   On: 11/08/2023 12:10   DG C-ARM BRONCHOSCOPY Result Date: 11/08/2023 C-ARM BRONCHOSCOPY: Fluoroscopy was utilized by the requesting physician.  No radiographic interpretation.   NM PET Image Initial (PI) Skull Base To Thigh Result Date: 10/29/2023 CLINICAL DATA:  Initial treatment strategy for right lower lobe lung nodule. EXAM: NUCLEAR MEDICINE PET SKULL BASE TO THIGH TECHNIQUE: 8.78 mCi F-18 FDG was injected intravenously. Full-ring PET imaging was performed from the skull base to thigh after the radiotracer. CT data was obtained and used for attenuation correction and anatomic localization. Fasting blood glucose: 90 mg/dl COMPARISON:  Chest CT 13/24/4010 FINDINGS: Mediastinal blood pool activity: SUV max 2.85 Liver activity: SUV max NA NECK: No hypermetabolic lymph nodes in the neck. Incidental CT findings: Right thyroid goiter with substernal extension. No hyper hypermetabolic nodules. CHEST: The 18 mm right lower lobe pulmonary lesion demonstrates low level hypermetabolism with SUV max of 2.65. Findings suspicious for adenocarcinoma. No enlarged or hypermetabolic mediastinal or hilar lymph nodes. No hypermetabolic breast masses, supraclavicular or  axillary adenopathy. Incidental CT findings: Scattered aortic and coronary artery calcifications. ABDOMEN/PELVIS: No abnormal hypermetabolic activity within the liver, pancreas, adrenal glands, or spleen. No hypermetabolic lymph nodes in the abdomen or pelvis. Incidental CT findings: Small bilateral renal calculi. 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 Age advanced aortic and iliac artery calcifications. Sigmoid colon diverticulosis. SKELETON: No significant bony findings. Hypermetabolism noted in the right obturator internus muscle likely related to the muscle injury. Incidental CT findings: None. IMPRESSION: 1. 18 mm right lower lobe pulmonary lesion demonstrates low level hypermetabolism. Findings suspicious for adenocarcinoma. Recommend biopsy. 2. No findings for metastatic disease involving the neck, chest, abdomen/pelvis or bony structures. 3. 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 4. Age advanced aortic and iliac artery calcifications. 5. Aortic atherosclerosis. Electronically Signed  By: Rudie Meyer M.D.   On: 10/29/2023 20:09     Past medical hx Past Medical History:  Diagnosis Date   Adhesive capsulitis of left shoulder 12/16/2016   Asthma without status asthmaticus 02/06/2021   At risk for falls 02/04/2019   Atrial fibrillation (HCC) 09/01/2014   Last Assessment & Plan:  Formatting of this note might be different from the original. A:  Chronic.  Sinus rhythm at this time.  States she was told this during admission at [facility] in the past. P:  On baby aspirin at home will continue.  Low dose metoprolol started.   Bipolar 1 disorder, depressed, moderate (HCC) 02/18/2020   Capsulitis of left shoulder 10/20/2021   Chronic anticoagulation 02/20/2020   Chronic atrial fibrillation (HCC) 02/18/2020   Chronic headache 10/11/2013   Chronic interstitial cystitis 12/28/2005   Chronic migraine without aura, intractable, without status migrainosus 06/24/2017   Colon cancer screening  04/15/2016   Last Assessment & Plan:  Formatting of this note might be different from the original. Will schedule for colonoscopy   Depression, recurrent (HCC) 02/18/2020   Diabetes mellitus (HCC)    Dyslipidemia 02/20/2020   GAD (generalized anxiety disorder) 02/18/2020   GERD (gastroesophageal reflux disease) 11/05/2021   H/O chest pain 07/31/2021   History of atrial fibrillation 06/24/2017   History of posttraumatic stress disorder (PTSD) 10/07/2016   Hyperosmolarity due to secondary diabetes mellitus (HCC) 02/06/2021   Impaired mobility and activities of daily living 11/14/2015   Irritable bowel syndrome (IBS)    Junctional tachycardia (HCC) 11/05/2021   Kidney stone 02/06/2021   Migraine without aura, not refractory 02/06/2021   Mixed hyperlipidemia 04/06/2021   Last Assessment & Plan:  Formatting of this note might be different from the original. 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 Controlled   Refractory migraine with aura 02/06/2021   Refractory migraine without aura 02/06/2021   Renal colic on left side 04/22/2017   Stage 3 chronic kidney disease (HCC) 02/20/2020   Thyroid goiter    10/17/23 CT: right thyorid goiter with substernal extension, benign FNA 07/07/23   Unspecified adverse effect of other drug, medicinal and biological substance(995.29) 02/17/2006   Urinary tract obstruction 02/06/2021     Social History   Tobacco Use   Smoking status: Never   Smokeless tobacco: Never   Tobacco comments:    Never smoke 07/15/22  Vaping Use   Vaping status: Never Used  Substance Use Topics   Alcohol use: Never    Comment: light social   Drug use:  Never    Ms.Lahaie reports that she has never smoked. She has never used smokeless tobacco. She reports that she does not drink alcohol and does not use drugs.  Tobacco Cessation: Counseling given: Not Answered Tobacco comments: Never smoke 07/15/22 Never smoker  Past surgical hx, Family hx, Social hx all reviewed.  Current Outpatient Medications on File Prior to Visit  Medication Sig   acetaminophen (TYLENOL) 500 MG tablet Take 1,000 mg by mouth every 6 (six) hours as needed for mild pain.   atorvastatin (LIPITOR) 80 MG tablet TAKE 1 TABLET DAILY   blood glucose meter kit and supplies One Touch Ultra  Check blood sugars once daily Dx: E11.9   botulinum toxin Type A (BOTOX) 200 units injection Inject 155 Units into the muscle See admin instructions. Inject 155 units IM into multiple site in the face,neck and head once every 90 days   budesonide-formoterol (SYMBICORT) 80-4.5 MCG/ACT inhaler Inhale 2 puffs into the lungs 2 (two) times daily.   buPROPion (WELLBUTRIN XL) 300 MG 24 hr tablet Take 1 tablet (300 mg total) by mouth daily.   Calcium Carbonate (CALCIUM 500 PO) Take 500 mg by mouth daily.   ELIQUIS 5 MG TABS tablet TAKE 1 TABLET TWICE A DAY   EPINEPHrine 0.3 mg/0.3 mL IJ SOAJ injection Inject 0.3 mg into the muscle as needed for anaphylaxis.   escitalopram (LEXAPRO) 10 MG tablet Take 1 tablet (10 mg total) by mouth daily.   ezetimibe (ZETIA) 10 MG tablet TAKE 1 TABLET DAILY   FARXIGA 10 MG TABS tablet TAKE 1 TABLET DAILY   fenofibrate 160 MG tablet Take 1 tablet (160 mg total) by mouth daily.   fluticasone (FLONASE) 50 MCG/ACT nasal spray Place 2 sprays into both nostrils daily.   Fremanezumab-vfrm (AJOVY) 225 MG/1.5ML SOAJ Inject 225 mg into the skin every 28 (twenty-eight) days.   levalbuterol (XOPENEX HFA) 45 MCG/ACT inhaler Inhale 2 puffs into the lungs every 6 (six) hours as needed for wheezing or shortness of breath.   levocetirizine (XYZAL) 5 MG tablet TAKE 1 TABLET EVERY  EVENING   LORazepam (ATIVAN) 0.5 MG tablet Take 1 tablet (0.5 mg total) by mouth every 8 (eight) hours.   Lurasidone HCl 120 MG TABS Take 1 tablet (120 mg total) by mouth at bedtime.   magnesium oxide (MAG-OX) 400 (240 Mg) MG tablet Take 400 mg by mouth daily.   metoprolol succinate (TOPROL-XL) 50 MG 24 hr tablet Take 1 tablet (50 mg total) by mouth daily. Take with or immediately following a meal.   metoprolol tartrate (LOPRESSOR) 25 MG tablet Take 1 tablet (25 mg total) by mouth every 4 (four) hours as needed (PALPITATIONS).   MOUNJARO 12.5 MG/0.5ML Pen INJECT 12.5 MG UNDER THE SKIN WEEKLY   Multiple Vitamins-Minerals (MULTIVITAMIN WITH MINERALS) tablet Take 1 tablet by mouth daily. Daily Diabetes Health Pack - MVI + chromium, fish oil + vit D, magnesium, vit C, alpha lipoic acid w/ green tea   nitroGLYCERIN (NITROSTAT) 0.4 MG SL tablet Place 1 tablet (0.4 mg total) under the tongue every 5 (five) minutes as needed for chest pain.   ondansetron (ZOFRAN-ODT) 4 MG disintegrating tablet PLACE ONE TABLET ON THE TONGUE AND ALLOW TO DISSOLVE EVERY 8 HOURS AS NEEDED FOR NAUSEA OR VOMITING   ONETOUCH VERIO test strip USE DAILY TO CHECK BLOOD SUGAR   pantoprazole (PROTONIX) 40 MG tablet Take 1 tablet (40 mg total) by mouth daily.   QUEtiapine (SEROQUEL) 300 MG tablet Take 1 tablet (300 mg total) by mouth at bedtime.   Rimegepant Sulfate (NURTEC) 75 MG TBDP Take 1 tablet (75 mg total) by mouth as needed (take 1 tab at the earlist onset of a migraine. Max 1 tab in 24 hours).   topiramate (TOPAMAX) 50 MG tablet TAKE 3 TABLETS TWICE A DAY   No current facility-administered medications on file prior to visit.     Allergies  Allergen Reactions   Bee Venom Anaphylaxis   Onion Other (See Comments)    Respiratory Distress   Tetanus-Diphtheria Toxoids Td Anaphylaxis   Sumatriptan     Passed out, nose bleed    Zolpidem Other (See  Comments)    Causes sleep walking   Asa [Aspirin] Nausea And Vomiting    Iodine Rash   Nsaids Nausea Only    Rash, hives and trouble breathing   Sulfa Antibiotics Rash   Vancomycin Rash    Other Reaction(s): STEVENS-JOHNSON    Review Of Systems:  Constitutional:   No  weight loss, night sweats,  Fevers, chills, fatigue, or  lassitude.  HEENT:   No headaches,  Difficulty swallowing,  Tooth/dental problems, or  Sore throat,                No sneezing, itching, ear ache, nasal congestion, post nasal drip,   CV:  No chest pain,  Orthopnea, PND, swelling in lower extremities, anasarca, dizziness, palpitations, syncope.   GI  No heartburn, indigestion, abdominal pain, nausea, vomiting, diarrhea, change in bowel habits, loss of appetite, bloody stools.   Resp: + shortness of breath with exertion none at rest.  No excess mucus, no productive cough,  + non-productive cough,  No coughing up of blood.  No change in color of mucus.  No wheezing.  No chest wall deformity  Skin: no rash or lesions.  GU: no dysuria, change in color of urine, no urgency or frequency.  No flank pain, no hematuria   MS:  No joint pain or swelling.  No decreased range of motion.  No back pain.  Psych:  No change in mood or affect. No depression or anxiety.  No memory loss.   Vital Signs BP 113/76 (BP Location: Left Arm, Patient Position: Sitting, Cuff Size: Normal)   Pulse 73   Ht 5\' 6"  (1.676 m)   Wt 172 lb (78 kg)   SpO2 100%   BMI 27.76 kg/m    Physical Exam:  General- No distress,  A&Ox3, pleasant ENT: No sinus tenderness, TM clear, pale nasal mucosa, no oral exudate,no post nasal drip, no LAN Cardiac: S1, S2, regular rate and rhythm, no murmur Chest: No wheeze/ rales/ dullness; no accessory muscle use, no nasal flaring, no sternal retractions Abd.: Soft Non-tender, ND, BS+, Body mass index is 27.76 kg/m.  Ext: No clubbing cyanosis, edema, no obvious deformities Neuro:  normal strength, MAE x 4, A&O x 3 Skin: No rashes, warm and dry, no obvious lesions  Psych: normal  mood and behavior   Assessment/Plan Lung nodule in never smoker  Post bronchoscopy with biopsies 11/08/2023 Biopsies negative for malignancy Right lower lobe brushings and needle biopsies show abundant acute inflammation, bronchial cells and macrophages  Cultures are pending Plan I am glad you have been doing well since the procedure. We will do a CXR today to better evaluate why you are experiencing shortness of breath.  I will call you with the results. You biopsies were negative for malignancy , which is great news.  We will do a 3 month follow up CT chest to ensure the nodule is stable. This will be due 02/2024. You will get a call to get it scheduled closer to the time. You will follow up with me after the scan to review results. Continue using your albuterol inhaler as needed for shortness of breath or wheezing.  Call to be seen if cough and shortness of breath do not improve within the next 2 weeks.  Please contact office for sooner follow up if symptoms do not improve or worsen or seek emergency care    I spent 20 minutes dedicated to the care of this patient on the date of this encounter to  include pre-visit review of records, face-to-face time with the patient discussing conditions above, post visit ordering of testing, clinical documentation with the electronic health record, making appropriate referrals as documented, and communicating necessary information to the patient's healthcare team.    Bevelyn Ngo, NP 11/23/2023  9:08 AM

## 2023-11-25 DIAGNOSIS — S93492A Sprain of other ligament of left ankle, initial encounter: Secondary | ICD-10-CM | POA: Diagnosis not present

## 2023-11-28 ENCOUNTER — Other Ambulatory Visit: Payer: Self-pay | Admitting: Neurology

## 2023-11-28 ENCOUNTER — Other Ambulatory Visit: Payer: Self-pay | Admitting: Family Medicine

## 2023-11-28 ENCOUNTER — Ambulatory Visit (INDEPENDENT_AMBULATORY_CARE_PROVIDER_SITE_OTHER): Payer: Medicare (Managed Care) | Admitting: Family Medicine

## 2023-11-28 ENCOUNTER — Encounter: Payer: Self-pay | Admitting: Neurology

## 2023-11-28 ENCOUNTER — Encounter: Payer: Self-pay | Admitting: Family Medicine

## 2023-11-28 VITALS — BP 112/60 | HR 77 | Temp 97.8°F | Ht 66.0 in | Wt 169.6 lb

## 2023-11-28 DIAGNOSIS — R682 Dry mouth, unspecified: Secondary | ICD-10-CM

## 2023-11-28 DIAGNOSIS — H9193 Unspecified hearing loss, bilateral: Secondary | ICD-10-CM | POA: Diagnosis not present

## 2023-11-28 DIAGNOSIS — E1165 Type 2 diabetes mellitus with hyperglycemia: Secondary | ICD-10-CM

## 2023-11-28 DIAGNOSIS — Z7984 Long term (current) use of oral hypoglycemic drugs: Secondary | ICD-10-CM | POA: Diagnosis not present

## 2023-11-28 DIAGNOSIS — Z0000 Encounter for general adult medical examination without abnormal findings: Secondary | ICD-10-CM

## 2023-11-28 DIAGNOSIS — Z1283 Encounter for screening for malignant neoplasm of skin: Secondary | ICD-10-CM

## 2023-11-28 DIAGNOSIS — R42 Dizziness and giddiness: Secondary | ICD-10-CM | POA: Diagnosis not present

## 2023-11-28 LAB — COMPREHENSIVE METABOLIC PANEL
ALT: 21 U/L (ref 0–35)
AST: 38 U/L — ABNORMAL HIGH (ref 0–37)
Albumin: 3.7 g/dL (ref 3.5–5.2)
Alkaline Phosphatase: 57 U/L (ref 39–117)
BUN: 17 mg/dL (ref 6–23)
CO2: 21 meq/L (ref 19–32)
Calcium: 9.6 mg/dL (ref 8.4–10.5)
Chloride: 110 meq/L (ref 96–112)
Creatinine, Ser: 1.77 mg/dL — ABNORMAL HIGH (ref 0.40–1.20)
GFR: 29.66 mL/min — ABNORMAL LOW (ref >60.00)
Glucose, Bld: 92 mg/dL (ref 70–99)
Potassium: 3.9 meq/L (ref 3.5–5.1)
Sodium: 140 meq/L (ref 135–145)
Total Bilirubin: 0.6 mg/dL (ref 0.2–1.2)
Total Protein: 6.3 g/dL (ref 6.0–8.3)

## 2023-11-28 LAB — LIPID PANEL
Cholesterol: 121 mg/dL (ref 0–200)
HDL: 31.9 mg/dL — ABNORMAL LOW (ref >39.00)
LDL Cholesterol: 56 mg/dL (ref 0–99)
NonHDL: 89.28
Total CHOL/HDL Ratio: 4
Triglycerides: 164 mg/dL — ABNORMAL HIGH (ref 0.0–149.0)
VLDL: 32.8 mg/dL (ref 0.0–40.0)

## 2023-11-28 LAB — CBC WITH DIFFERENTIAL/PLATELET
Basophils Absolute: 0.1 10*3/uL (ref 0.0–0.1)
Basophils Relative: 0.9 % (ref 0.0–3.0)
Eosinophils Absolute: 0.3 10*3/uL (ref 0.0–0.7)
Eosinophils Relative: 3.8 % (ref 0.0–5.0)
HCT: 35.5 % — ABNORMAL LOW (ref 36.0–46.0)
Hemoglobin: 11.4 g/dL — ABNORMAL LOW (ref 12.0–15.0)
Lymphocytes Relative: 25.1 % (ref 12.0–46.0)
Lymphs Abs: 1.7 10*3/uL (ref 0.7–4.0)
MCHC: 32 g/dL (ref 30.0–36.0)
MCV: 98.2 fl (ref 78.0–100.0)
Monocytes Absolute: 0.4 10*3/uL (ref 0.1–1.0)
Monocytes Relative: 6.4 % (ref 3.0–12.0)
Neutro Abs: 4.3 10*3/uL (ref 1.4–7.7)
Neutrophils Relative %: 63.8 % (ref 43.0–77.0)
Platelets: 341 10*3/uL (ref 150.0–400.0)
RBC: 3.62 Mil/uL — ABNORMAL LOW (ref 3.87–5.11)
RDW: 15.7 % — ABNORMAL HIGH (ref 11.5–15.5)
WBC: 6.7 10*3/uL (ref 4.0–10.5)

## 2023-11-28 LAB — HEMOGLOBIN A1C: Hgb A1c MFr Bld: 4.7 % (ref 4.6–6.5)

## 2023-11-28 MED ORDER — AJOVY 225 MG/1.5ML ~~LOC~~ SOAJ: 225.0000 mg | SUBCUTANEOUS | 5 refills | Status: DC

## 2023-11-28 MED ORDER — METOPROLOL SUCCINATE ER 50 MG PO TB24: 25.0000 mg | ORAL_TABLET | Freq: Every day | ORAL | Status: DC

## 2023-11-28 NOTE — Patient Instructions (Addendum)
 Give [place] 2-3 business days to get the results of your labs back.   Keep the diet clean and stay active.  Please get me a copy of your advanced directive form at your convenience.   If you do not hear anything about your referrals in the next 1-2 weeks, call our office and ask for an update.  When you get dizzy, check your blood sugar please.   Send me a message in a 3-4 weeks if still having episodes despite decreasing the metoprolol dosage and having normal sugars.   Let us know if you need anything.

## 2023-11-28 NOTE — Progress Notes (Signed)
 Chief Complaint  Patient presents with   Annual Exam    Patient presents today for physical exam.     Well Woman Paula Stout is here for a complete physical.   Her last physical was >1 year ago.  Current diet: in general, a "healthy" diet. Current exercise: none. Weight is intentionally decreasing and she denies daytime fatigue. Seatbelt? Yes Advanced directive? No  Health Maintenance Colonoscopy- Yes Shingrix- Yes DEXA- Yes Mammogram- Yes Tetanus- Yes Pneumonia- Yes Hep C screen- Yes  Over the past several months, she will get routine lightheadedness.  It is associated with standing up, but not recent change in position.  She sees the EP team.  She is currently taking metoprolol XL 50 mg daily and reports compliance.  She has been losing weight on Mounjaro.  She has not checked her sugars routinely nor does she check it when she is symptomatic with the lightheadedness.  She is drinking normally.  She is down eating 1 meal daily.  She still plans to lose more weight.  Past Medical History:  Diagnosis Date   Adhesive capsulitis of left shoulder 12/16/2016   Asthma without status asthmaticus 02/06/2021   At risk for falls 02/04/2019   Atrial fibrillation (HCC) 09/01/2014   Last Assessment & Plan:  Formatting of this note might be different from the original. A:  Chronic.  Sinus rhythm at this time.  States she was told this during admission at [facility] in the past. P:  On baby aspirin at home will continue.  Low dose metoprolol started.   Bipolar 1 disorder, depressed, moderate (HCC) 02/18/2020   Capsulitis of left shoulder 10/20/2021   Chronic anticoagulation 02/20/2020   Chronic atrial fibrillation (HCC) 02/18/2020   Chronic headache 10/11/2013   Chronic interstitial cystitis 12/28/2005   Chronic migraine without aura, intractable, without status migrainosus 06/24/2017   Colon cancer screening 04/15/2016   Last Assessment & Plan:  Formatting of this note might be  different from the original. Will schedule for colonoscopy   Depression, recurrent (HCC) 02/18/2020   Diabetes mellitus (HCC)    Dyslipidemia 02/20/2020   GAD (generalized anxiety disorder) 02/18/2020   GERD (gastroesophageal reflux disease) 11/05/2021   H/O chest pain 07/31/2021   History of atrial fibrillation 06/24/2017   History of posttraumatic stress disorder (PTSD) 10/07/2016   Hyperosmolarity due to secondary diabetes mellitus (HCC) 02/06/2021   Impaired mobility and activities of daily living 11/14/2015   Irritable bowel syndrome (IBS)    Junctional tachycardia (HCC) 11/05/2021   Kidney stone 02/06/2021   Migraine without aura, not refractory 02/06/2021   Mixed hyperlipidemia 04/06/2021   Last Assessment & Plan:  Formatting of this note might be different from the original. 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 Controlled   Refractory migraine with aura 02/06/2021   Refractory migraine without aura 02/06/2021   Renal colic on left side 04/22/2017   Stage 3 chronic kidney disease (HCC) 02/20/2020   Thyroid goiter    10/17/23 CT: right thyorid goiter with substernal extension, benign FNA 07/07/23   Unspecified adverse effect of other drug, medicinal and biological substance(995.29) 02/17/2006   Urinary tract obstruction 02/06/2021     Past Surgical History:  Procedure Laterality Date   ABDOMINAL HYSTERECTOMY     Fibroids   ATRIAL FIBRILLATION ABLATION     ATRIAL FIBRILLATION ABLATION N/A 10/27/2022   Procedure: ATRIAL FIBRILLATION ABLATION;  Surgeon: Regan Lemming, MD;  Location: [facility] INVASIVE CV LAB;  Service: Cardiovascular;  Laterality: N/A;    BRONCHIAL BIOPSY  11/08/2023   Procedure: BRONCHOSCOPY, WITH BIOPSY;  Surgeon: Leslye Peer, MD;  Location: [facility] ENDOSCOPY;  Service: Pulmonary;;   BRONCHIAL BRUSHINGS  11/08/2023   Procedure: BRONCHOSCOPY, WITH BRUSH BIOPSY;  Surgeon: Leslye Peer, MD;  Location: [facility] ENDOSCOPY;  Service: Pulmonary;;   BRONCHIAL NEEDLE ASPIRATION BIOPSY  11/08/2023   Procedure: BRONCHOSCOPY, WITH NEEDLE ASPIRATION BIOPSY;  Surgeon: Leslye Peer, MD;  Location: [facility] ENDOSCOPY;  Service: Pulmonary;;   BRONCHIAL WASHINGS  11/08/2023   Procedure: IRRIGATION, BRONCHUS;  Surgeon: Leslye Peer, MD;  Location: [facility] ENDOSCOPY;  Service: Pulmonary;;   CARDIAC CATHETERIZATION     CARDIOVERSION     CESAREAN SECTION     KNEE SURGERY     LEFT HEART CATH AND CORONARY ANGIOGRAPHY N/A 03/04/2020   Procedure: LEFT HEART CATH AND CORONARY ANGIOGRAPHY;  Surgeon: Lyn Records, MD;  Location: [facility] INV[facility] CV [facility];  Service: Cardiovascular;  Laterality: N/A;   ROTATOR CUFF REPAIR     SVT ABLATION N/A 05/04/2023   Procedure: SVT ABLATION;  Surgeon: Regan Lemming, MD;  Location: [facility] INV[facility] CV [facility];  Service: Cardiovascular;  Laterality: N/A;    Medications  Current Outpatient Medications on File Prior to Visit  Medication Sig Dispense Refill   acetaminophen (TYLENOL) 500 MG tablet Take 1,000 mg by mouth every 6 (six) hours as needed for mild pain.     atorvastatin (LIPITOR) 80 MG tablet TAKE 1 TABLET DAILY 90 tablet 3   blood glucose meter kit and supplies One Touch Ultra  Check blood sugars once daily Dx: E11.9 1 each 0   botulinum toxin Type A (BOTOX) 200 units injection Inject 155 Units into the muscle See admin instructions. Inject 155 units IM into multiple site in the face,neck and head once every 90 days     budesonide-formoterol (SYMBICORT) 80-4.5 MCG/ACT inhaler Inhale 2 puffs into the lungs 2 (two) times daily. 20.4 g 3   buPROPion (WELLBUTRIN XL) 300 MG 24 hr tablet Take 1 tablet (300 mg total) by mouth daily. 90  tablet 1   Calcium Carbonate (CALCIUM 500 PO) Take 500 mg by mouth daily.     ELIQUIS 5 MG TABS tablet TAKE 1 TABLET TWICE A DAY 180 tablet 3   EPINEPHrine 0.3 mg/0.3 mL IJ SOAJ injection Inject 0.3 mg into the muscle as needed for anaphylaxis. 1 each 1   escitalopram (LEXAPRO) 10 MG tablet Take 1 tablet (10 mg total) by mouth daily. 90 tablet 1   ezetimibe (ZETIA) 10 MG tablet TAKE 1 TABLET DAILY 90 tablet 3   FARXIGA 10 MG TABS tablet TAKE 1 TABLET DAILY 90 tablet 3   fenofibrate 160 MG tablet Take 1 tablet (160 mg total) by mouth daily. 90 tablet 1   fluticasone (FLONASE) 50 MCG/ACT nasal spray Place 2 sprays into both nostrils daily. 16 g 6   Fremanezumab-vfrm (AJOVY) 225 MG/1.5ML SOAJ Inject 225 mg into the skin every 28 (twenty-eight) days. 1.68 mL 11   levalbuterol (XOPENEX HFA) 45 MCG/ACT inhaler Inhale 2 puffs into the lungs every 6 (six) hours as needed for wheezing or shortness of breath. 15 g 2   levocetirizine (XYZAL) 5 MG tablet  TAKE 1 TABLET EVERY EVENING 90 tablet 3   LORazepam (ATIVAN) 0.5 MG tablet Take 1 tablet (0.5 mg total) by mouth every 8 (eight) hours. 30 tablet 1   Lurasidone HCl 120 MG TABS Take 1 tablet (120 mg total) by mouth at bedtime. 90 tablet 3   magnesium oxide (MAG-OX) 400 (240 Mg) MG tablet Take 400 mg by mouth daily.     metoprolol succinate (TOPROL-XL) 50 MG 24 hr tablet Take 1 tablet (50 mg total) by mouth daily. Take with or immediately following a meal. 90 tablet 3   metoprolol tartrate (LOPRESSOR) 25 MG tablet Take 1 tablet (25 mg total) by mouth every 4 (four) hours as needed (PALPITATIONS). 90 tablet 3   MOUNJARO 12.5 MG/0.5ML Pen INJECT 12.5 MG UNDER THE SKIN WEEKLY 2 mL 12   Multiple Vitamins-Minerals (MULTIVITAMIN WITH MINERALS) tablet Take 1 tablet by mouth daily. Daily Diabetes Health Pack - MVI + chromium, fish oil + vit D, magnesium, vit C, alpha lipoic acid w/ green tea     nitroGLYCERIN (NITROSTAT) 0.4 MG SL tablet Place 1 tablet (0.4 mg total)  under the tongue every 5 (five) minutes as needed for chest pain. 25 tablet 11   ondansetron (ZOFRAN-ODT) 4 MG disintegrating tablet PLACE ONE TABLET ON THE TONGUE AND ALLOW TO DISSOLVE EVERY 8 HOURS AS NEEDED FOR NAUSEA OR VOMITING 30 tablet 0   ONETOUCH VERIO test strip USE DAILY TO CHECK BLOOD SUGAR 100 strip 3   pantoprazole (PROTONIX) 40 MG tablet Take 1 tablet (40 mg total) by mouth daily. 90 tablet 3   QUEtiapine (SEROQUEL) 300 MG tablet Take 1 tablet (300 mg total) by mouth at bedtime. 90 tablet 3   Rimegepant Sulfate (NURTEC) 75 MG TBDP Take 1 tablet (75 mg total) by mouth as needed (take 1 tab at the earlist onset of a migraine. Max 1 tab in 24 hours). 10 tablet 5   topiramate (TOPAMAX) 50 MG tablet TAKE 3 TABLETS TWICE A DAY 180 tablet 11   Allergies Allergies  Allergen Reactions   Bee Venom Anaphylaxis   Onion Other (See Comments)    Respiratory Distress   Tetanus-Diphtheria Toxoids Td Anaphylaxis   Sumatriptan     Passed out, nose bleed    Zolpidem Other (See Comments)    Causes sleep walking   Asa [Aspirin] Nausea And Vomiting   Iodine Rash   Nsaids Nausea Only    Rash, hives and trouble breathing   Sulfa Antibiotics Rash   Vancomycin Rash    Other Reaction(s): STEVENS-JOHNSON    Review of Systems: Constitutional:  no fevers Eye:  no recent significant change in vision Ears:  No changes in hearing Nose/Mouth/Throat:  no complaints of nasal congestion, no sore throat Cardiovascular: no chest pain Respiratory:  No shortness of breath Gastrointestinal:  No change in bowel habits GU:  Female: negative for dysuria Integumentary:  no abnormal skin lesions reported Neurologic:  no headaches Endocrine:  denies unexplained weight changes  Exam BP 112/60   Pulse 77   Temp 97.8 F (36.6 C)   Ht 5\' 6"  (1.676 m)   Wt 169 lb 9.6 oz (76.9 kg)   SpO2 99%   BMI 27.37 kg/m  General:  well developed, well nourished, in no apparent distress Skin:  no significant moles,  warts, or growths Head:  no masses, lesions, or tenderness Eyes:  pupils equal and round, sclera anicteric without injection Ears:  canals without lesions, TMs shiny without retraction, no obvious effusion, no  erythema Nose:  nares patent, mucosa normal, and no drainage Throat/Pharynx:  lips and gingiva without lesion; tongue and uvula midline; non-inflamed pharynx; no exudates or postnasal drainage Neck: neck supple without adenopathy, thyromegaly, or masses Lungs:  clear to auscultation, breath sounds equal bilaterally, no respiratory distress Cardio:  regular rate and rhythm, no bruits or LE edema Abdomen:  abdomen soft, nontender; bowel sounds normal; no masses or organomegaly Genital: Deferred Neuro:  gait normal; deep tendon reflexes normal and symmetric Psych: well oriented with normal range of affect and appropriate judgment/insight  Assessment and Plan  Well adult exam  Type 2 diabetes mellitus with hyperglycemia, without long-term current use of insulin (HCC) - Plan: Hemoglobin A1c, Comp Met (CMET), CBC with Differential/Platelet, Lipid panel  Bilateral hearing loss, unspecified hearing loss type - Plan: Ambulatory referral to Audiology  Skin cancer screening - Plan: Ambulatory referral to Dermatology  Lightheadedness  Dry mouth   Well 68 y.o. female. Counseled on diet and exercise. Advanced directive form provided today.  Other orders as above. Lightheadedness: Possibly adverse effect of chronic medication.  Decrease metoprolol from 50 mg daily to 25 mg daily.  She will send a message in 3 to 4 weeks if she is still having issues.  Will have her monitor her blood sugars when these episodes happen as well.  If blood sugars are normal and she has not improved with the metoprolol adjustment, we will reach out to her EP doc to see if he has any suggestions.  Could consider MRI of the brain again given her risk factors. Biotene mouthwash and hydration recommended.  Could  consider pilocarpine if no improvement. Study hearing loss, will refer to the audiology team for further evaluation. Follow up in 6 mo pending the above. The patient voiced understanding and agreement to the plan.  Jilda Roche [place], DO 11/28/23 11:58 AM

## 2023-11-29 ENCOUNTER — Other Ambulatory Visit: Payer: Self-pay

## 2023-11-29 DIAGNOSIS — R748 Abnormal levels of other serum enzymes: Secondary | ICD-10-CM

## 2023-11-29 DIAGNOSIS — E782 Mixed hyperlipidemia: Secondary | ICD-10-CM

## 2023-12-01 ENCOUNTER — Ambulatory Visit: Payer: Medicare (Managed Care) | Admitting: Behavioral Health

## 2023-12-01 ENCOUNTER — Encounter: Payer: Self-pay | Admitting: Behavioral Health

## 2023-12-01 ENCOUNTER — Other Ambulatory Visit: Payer: Self-pay | Admitting: Family Medicine

## 2023-12-01 DIAGNOSIS — F411 Generalized anxiety disorder: Secondary | ICD-10-CM | POA: Diagnosis not present

## 2023-12-01 DIAGNOSIS — F331 Major depressive disorder, recurrent, moderate: Secondary | ICD-10-CM | POA: Diagnosis not present

## 2023-12-01 NOTE — Progress Notes (Signed)
 [facility] Counselor/Therapist Progress Note  Patient ID: Paula Stout, MRN: [id number],    Date: 12/01/2023 Time Spent: 56 minutes, 3 PM until 3:56 PM . The patient consented to the video teletherapy and was located in her home during this session. She is aware it is the responsibility of the patient to secure confidentiality on her end of the session. The provider was in a private home  for the duration of this session.      Treatment Type: Individual Therapy  Reported Symptoms: Anxiety, depression  Mental Status Exam: Appearance:  Casual     Behavior: Appropriate  Motor: Normal  Speech/Language:  Normal Rate  Affect: Appropriate  Mood: normal  Thought process: normal  Thought content:   WNL  Sensory/Perceptual disturbances:   WNL  Orientation: oriented to person, place, time/date, situation, day of week, month of year, and year  Attention: Good  Concentration: Good  Memory: WNL  Fund of knowledge:  Good  Insight:   Good  Judgment:  Good  Impulse Control: Good   Risk Assessment: Danger to Self:  No Self-injurious Behavior: No Danger to Others: No Duty to Warn:no Physical Aggression / Violence:No  Access to Firearms a concern: No  Gang Involvement:No   Subjective: The patient had several episodes where she had elevated heart rate and passed out.  Late last week she was in the kitchen and feeding the cats and dogs and she said she felt herself getting lightheaded.  Her daughter was there but could not get to her saying she somewhat thought her legs got wobbly and she hit bottom first to the floor and woke up to her daughter calling her name.  She did explain that to her primary care physician when she saw him this week and he reduced her metoprolol thinking that may be a contributing factor to the episodes but since that time she has had significant repetitive episodes of increased heart rate which are concerning to her.  She says anytime she does something  getting up getting down walking it seems to happen.  It does not happen when she is laying or sitting so she is able to do her work but otherwise she is concerned that every time she moves it will happen again.  I encouraged her to reach back out to her primary care physician to let him know what has been happening since the medication change.  She also had a significant migraine to the point where nothing was working and she was getting sick.  She said she asked her daughter to take her to the emergency room but her daughter did not feel well and could not so she just had a push through it.  Because of her daughter's health issues not much is getting done around the house in that environment plus the atmosphere around the house is adding to her depression.  She is coping as best she can but not being able to get up and down without feeling like she might have an episode is contributing to her depression also.  She is going to reach her doctor today or tomorrow to see what joint changes might be made.  She is also speaking to her children in [place] and looking to see the possibility of other options.  She does contract for safety having no thoughts of hurting herself or anyone else. Diagnosis: Generalized anxiety disorder, major depressive disorder, moderate, recurrent Plan: I will meet with the patient weekly via care agility.  Treatment plan:  We will use cognitive behavioral therapy as well as person centered and supportive therapy in addition to elements of dialectical behavior therapy to help reduce the patient's anxiety and depression by at least 50% with a target date of October 06, 2023.  Goals for improving depression may include having less sadness as indicated by patient report and scores on the PHQ-9, have improved mood and return to a healthier level of functioning, identify causes including environment for depressed mood and learn ways to cope with depression especially those connected  to her medical issues.  Interventions include using cognitive behavioral therapy to explore and replace thoughts and behaviors.  We will look at how depression is experienced in day-to-day living and encouraged sharing of feelings.  We will encourage the use of coping skills for management of depressive symptoms.  Goals for reducing anxiety are to improve her ability to manage anxiety symptoms, better handle stress, identify causes for anxiety and explore ways to lower it, resolve the core conflicts contributing to anxiety as well as manage thoughts and worrisome thinking contributing to feelings of anxiety.  Interventions will include providing education about anxiety, facilitate problem solution skills to help her identify options for resolving stress, teach coping skills for managing anxiety as well as mindfulness and communication in terms of family to reduce her stress level, use cognitive behavior therapy to identify and change anxiety provoking thought and behavior patterns as well as teach distress tolerance and mindfulness skills for alleviating anxiety. Progress: 30% progress notes were reviewed with a new extended target date of April 04, 2024.  [name], [facility]                  [name], [facility]               [name], [facility]               [name], [facility]               [name], [facility]               [name], [facility]               [name], [facility]               [name], [facility]               [name], [facility]               [name], [facility]                     [name], [facility]               [name], [facility]               [name], [facility]               [name], [facility]               [name],  [facility]               [name], [facility]               [name], [facility]               [name], [facility]  [name], [facility]               [name], [facility]    [name], [facility]               [name], [facility]

## 2023-12-08 LAB — FUNGUS CULTURE WITH STAIN

## 2023-12-08 LAB — FUNGUS CULTURE RESULT

## 2023-12-08 LAB — FUNGAL ORGANISM REFLEX

## 2023-12-09 ENCOUNTER — Emergency Department (HOSPITAL_BASED_OUTPATIENT_CLINIC_OR_DEPARTMENT_OTHER)
Admission: EM | Admit: 2023-12-09 | Discharge: 2023-12-09 | Disposition: A | Discharge status: Home / Self Care | Payer: Medicare (Managed Care) | Attending: Emergency Medicine | Admitting: Emergency Medicine

## 2023-12-09 ENCOUNTER — Telehealth: Payer: Medicare (Managed Care) | Admitting: Behavioral Health

## 2023-12-09 ENCOUNTER — Encounter (HOSPITAL_BASED_OUTPATIENT_CLINIC_OR_DEPARTMENT_OTHER): Payer: Self-pay | Admitting: Urology

## 2023-12-09 ENCOUNTER — Other Ambulatory Visit: Payer: Self-pay

## 2023-12-09 ENCOUNTER — Encounter: Payer: Self-pay | Admitting: Behavioral Health

## 2023-12-09 ENCOUNTER — Encounter: Payer: Self-pay | Admitting: Family Medicine

## 2023-12-09 DIAGNOSIS — F411 Generalized anxiety disorder: Secondary | ICD-10-CM | POA: Diagnosis not present

## 2023-12-09 DIAGNOSIS — Z79899 Other long term (current) drug therapy: Secondary | ICD-10-CM | POA: Diagnosis not present

## 2023-12-09 DIAGNOSIS — F3130 Bipolar disorder, current episode depressed, mild or moderate severity, unspecified: Secondary | ICD-10-CM | POA: Diagnosis not present

## 2023-12-09 DIAGNOSIS — F410 Panic disorder [episodic paroxysmal anxiety] without agoraphobia: Secondary | ICD-10-CM | POA: Diagnosis not present

## 2023-12-09 DIAGNOSIS — G43809 Other migraine, not intractable, without status migrainosus: Secondary | ICD-10-CM | POA: Diagnosis not present

## 2023-12-09 DIAGNOSIS — R519 Headache, unspecified: Secondary | ICD-10-CM | POA: Diagnosis present

## 2023-12-09 MED ORDER — ESCITALOPRAM OXALATE 20 MG PO TABS: 20.0000 mg | ORAL_TABLET | Freq: Every day | ORAL | 2 refills | Status: DC

## 2023-12-09 MED ORDER — HYDROMORPHONE HCL 1 MG/ML IJ SOLN
1.0000 mg | Freq: Once | INTRAMUSCULAR | Status: AC
  Administered 2023-12-09: 1 mg via INTRAVENOUS
  Filled 2023-12-09: qty 1

## 2023-12-09 MED ORDER — EPINEPHRINE 0.3 MG/0.3ML IJ SOAJ: 0.3000 mg | INTRAMUSCULAR | 1 refills | Status: DC | PRN

## 2023-12-09 MED ORDER — PROCHLORPERAZINE EDISYLATE 10 MG/2ML IJ SOLN
5.0000 mg | Freq: Once | INTRAMUSCULAR | Status: AC
  Administered 2023-12-09: 5 mg via INTRAVENOUS
  Filled 2023-12-09: qty 2

## 2023-12-09 MED ORDER — DEXAMETHASONE SODIUM PHOSPHATE 10 MG/ML IJ SOLN
10.0000 mg | Freq: Once | INTRAMUSCULAR | Status: AC
  Administered 2023-12-09: 10 mg via INTRAVENOUS
  Filled 2023-12-09: qty 1

## 2023-12-09 MED ORDER — DIPHENHYDRAMINE HCL 50 MG/ML IJ SOLN
12.5000 mg | Freq: Once | INTRAMUSCULAR | Status: AC
  Administered 2023-12-09: 12.5 mg via INTRAVENOUS
  Filled 2023-12-09: qty 1

## 2023-12-09 NOTE — ED Provider Notes (Signed)
 [place] [facility] Provider Note   CSN: [id number] Arrival date & time: 12/09/23  1545     History  Chief Complaint  Patient presents with   Migraine    Paula Stout is a 68 y.o. female.  Patient here for ongoing migraine.  She has had some relief with her Nurtec at home but maxed out on it.  Called her neurologist who recommended she come in for headache cocktail.  She has had success with these in the past.  Sounds like we talked about taking some Compazine and Benadryl Decadron here.  She has no weakness numbness tingling.  No vision changes.  Headache just making difficult to sleep.  She denies any weakness numbness tingling.  Denies any chest pain shortness of breath.  She does talk about some chronic shortness of breath but she does not endorse any new symptoms.  This is not the worst headache of her life.  Feels like her normal migraines with nausea sensitivity light and sound.  She has a history of A-fib on blood thinners history of bipolar IBS.  Migraines usually last several days.  She has had this 1 for about 3 days.  She denies any trauma.  The history is provided by the patient.       Home Medications Prior to Admission medications   Medication Sig Start Date End Date Taking? Authorizing Provider  acetaminophen (TYLENOL) 500 MG tablet Take 1,000 mg by mouth every 6 (six) hours as needed for mild pain.    [provider]  atorvastatin (LIPITOR) 80 MG tablet TAKE 1 TABLET DAILY 02/09/23   Sharlene Dory, DO  blood glucose meter kit and supplies One Touch Ultra  Check blood sugars once daily Dx: E11.9 10/01/21   Sharlene Dory, DO  botulinum toxin Type A (BOTOX) 200 units injection Inject 155 Units into the muscle See admin instructions. Inject 155 units IM into multiple site in the face,neck and head once every 90 days 11/08/23   Leslye Peer, MD  budesonide-formoterol [facility]) 80-4.5 MCG/ACT inhaler Inhale  2 puffs into the lungs 2 (two) times daily. 08/11/23   Cobb, Ruby Cola, NP  buPROPion (WELLBUTRIN XL) 300 MG 24 hr tablet Take 1 tablet (300 mg total) by mouth daily. 10/19/23   Joan Flores, NP  Calcium Carbonate (CALCIUM 500 PO) Take 500 mg by mouth daily.    [provider]  ELIQUIS 5 MG TABS tablet TAKE 1 TABLET TWICE A DAY 02/02/23   Wendling, Jilda Roche, DO  EPINEPHrine 0.3 mg/0.3 mL IJ SOAJ injection Inject 0.3 mg into the muscle as needed for anaphylaxis. 12/09/23   Sharlene Dory, DO  escitalopram (LEXAPRO) 20 MG tablet Take 1 tablet (20 mg total) by mouth daily. 12/09/23   Joan Flores, NP  ezetimibe (ZETIA) 10 MG tablet TAKE 1 TABLET DAILY 02/03/23   Sharlene Dory, DO  FARXIGA 10 MG TABS tablet TAKE 1 TABLET DAILY 08/09/23   Sharlene Dory, DO  fenofibrate 160 MG tablet Take 1 tablet (160 mg total) by mouth daily. 09/21/23   Sharlene Dory, DO  fluticasone (FLONASE) 50 MCG/ACT nasal spray Place 2 sprays into both nostrils daily. 01/29/23   Hawks, Christy A, FNP  Fremanezumab-vfrm (AJOVY) 225 MG/1.5ML SOAJ Inject 225 mg into the skin every 30 (thirty) days. 11/28/23   Drema Dallas, DO  levalbuterol (XOPENEX HFA) 45 MCG/ACT inhaler Inhale 2 puffs into the lungs every 6 (six) hours  as needed for wheezing or shortness of breath. 08/11/23   Cobb, Ruby Cola, NP  levocetirizine (XYZAL) 5 MG tablet TAKE 1 TABLET EVERY EVENING 12/01/23   Wendling, Jilda Roche, DO  LORazepam (ATIVAN) 0.5 MG tablet Take 1 tablet (0.5 mg total) by mouth every 8 (eight) hours. 10/19/23   Joan Flores, NP  Lurasidone HCl 120 MG TABS Take 1 tablet (120 mg total) by mouth at bedtime. 10/19/23   Joan Flores, NP  magnesium oxide (MAG-OX) 400 (240 Mg) MG tablet Take 400 mg by mouth daily.    [provider]  metoprolol succinate (TOPROL-XL) 50 MG 24 hr tablet Take 0.5 tablets (25 mg total) by mouth daily. Take with or immediately following a meal. 11/28/23 02/26/24   Wendling, Jilda Roche, DO  metoprolol tartrate (LOPRESSOR) 25 MG tablet Take 1 tablet (25 mg total) by mouth every 4 (four) hours as needed (PALPITATIONS). 08/24/22   Camnitz, Andree Coss, MD  MOUNJARO 12.5 MG/0.5ML Pen INJECT 12.5 MG UNDER THE SKIN WEEKLY 07/18/23   Sharlene Dory, DO  Multiple Vitamins-Minerals (MULTIVITAMIN WITH MINERALS) tablet Take 1 tablet by mouth daily. Daily Diabetes Health Pack - MVI + chromium, fish oil + vit D, magnesium, vit C, alpha lipoic acid w/ green tea    [provider]  nitroGLYCERIN (NITROSTAT) 0.4 MG SL tablet Place 1 tablet (0.4 mg total) under the tongue every 5 (five) minutes as needed for chest pain. 03/16/23   Georgeanna Lea, MD  ondansetron (ZOFRAN-ODT) 4 MG disintegrating tablet PLACE ONE TABLET ON THE TONGUE AND ALLOW TO DISSOLVE EVERY 8 HOURS AS NEEDED FOR NAUSEA OR VOMITING 11/29/23   Wendling, Jilda Roche, DO  [facility] VERIO test strip USE DAILY TO CHECK BLOOD SUGAR 10/29/22   Carmelia Roller, Jilda Roche, DO  pantoprazole (PROTONIX) 40 MG tablet Take 1 tablet (40 mg total) by mouth daily. 11/22/22   Sharlene Dory, DO  QUEtiapine (SEROQUEL) 300 MG tablet Take 1 tablet (300 mg total) by mouth at bedtime. 10/19/23   Joan Flores, NP  Rimegepant Sulfate (NURTEC) 75 MG TBDP Take 1 tablet (75 mg total) by mouth as needed (take 1 tab at the earlist onset of a migraine. Max 1 tab in 24 hours). 04/04/23   Drema Dallas, DO  topiramate (TOPAMAX) 50 MG tablet TAKE 3 TABLETS TWICE A DAY 08/23/23   Wendling, Jilda Roche, DO      Allergies    Bee venom, Onion, Tetanus-diphtheria toxoids td, Sumatriptan, Zolpidem, Asa [aspirin], Iodine, Nsaids, Sulfa antibiotics, and Vancomycin    Review of Systems   Review of Systems  Physical Exam Updated Vital Signs BP 134/79   Pulse 78   Temp 97.6 F (36.4 C)   Resp 19   Ht 5\' 6"  (1.676 m)   Wt 76.9 kg   SpO2 97%   BMI 27.36 kg/m  Physical Exam Vitals and nursing note reviewed.   Constitutional:      General: She is not in acute distress.    Appearance: She is well-developed. She is not ill-appearing.  HENT:     Head: Normocephalic and atraumatic.     Nose: Nose normal.     Mouth/Throat:     Mouth: Mucous membranes are moist.  Eyes:     Extraocular Movements: Extraocular movements intact.     Conjunctiva/sclera: Conjunctivae normal.     Pupils: Pupils are equal, round, and reactive to light.  Cardiovascular:     Rate and Rhythm: Normal rate and regular  rhythm.     Pulses: Normal pulses.     Heart sounds: Normal heart sounds. No murmur heard. Pulmonary:     Effort: Pulmonary effort is normal. No respiratory distress.     Breath sounds: Normal breath sounds.  Abdominal:     Palpations: Abdomen is soft.     Tenderness: There is no abdominal tenderness.  Musculoskeletal:        General: No swelling.     Cervical back: Normal range of motion and neck supple.  Skin:    General: Skin is warm and dry.     Capillary Refill: Capillary refill takes less than 2 seconds.  Neurological:     General: No focal deficit present.     Mental Status: She is alert and oriented to person, place, and time.     Cranial Nerves: No cranial nerve deficit.     Sensory: No sensory deficit.     Motor: No weakness.     Coordination: Coordination normal.     Gait: Gait normal.     Comments: 5+ out of 5 strength throughout, normal sensation, no drift, normal finger-nose-finger, normal speech  Psychiatric:        Mood and Affect: Mood normal.     ED Results / Procedures / Treatments   Labs (all labs ordered are listed, but only abnormal results are displayed) Labs Reviewed - No data to display  EKG EKG Interpretation Date/Time:  Friday December 09 2023 15:54:43 EDT Ventricular Rate:  92 PR Interval:  180 QRS Duration:  86 QT Interval:  390 QTC Calculation: 482 R Axis:   -46  Text Interpretation: aflutter v artifact. Left anterior fascicular block Cannot rule out Anterior  infarct , age undetermined Abnormal ECG When compared with ECG of 03-Nov-2023 14:16, PREVIOUS ECG IS PRESENT Confirmed by Virgina Norfolk [phone]) on 12/09/2023 3:56:41 PM  Radiology No results found.  Procedures Procedures    Medications Ordered in ED Medications  prochlorperazine (COMPAZINE) injection 5 mg (5 mg Intravenous Given 12/09/23 1701)  diphenhydrAMINE (BENADRYL) injection 12.5 mg (12.5 mg Intravenous Given 12/09/23 1706)  dexamethasone (DECADRON) injection 10 mg (10 mg Intravenous Given 12/09/23 1706)  HYDROmorphone (DILAUDID) injection 1 mg (1 mg Intravenous Given 12/09/23 1742)  prochlorperazine (COMPAZINE) injection 5 mg (5 mg Intravenous Given 12/09/23 1741)    ED Course/ Medical Decision Making/ A&P                                 Medical Decision Making Risk Prescription drug management.   Bettey Muraoka is here with migraine.  Migraine headache now for the last 3 days her home medications have not helped much.  She has a history of bipolar A-fib on blood thinners, depression and anxiety.  She has had some poor sleep.  This is typical of her bad migraines with photophobia and nausea.  She has had no trauma.  She has normal neurological exam.  This is not the worst headache of her life.  I do not think we need to do any head imaging at this time she is well-appearing otherwise.  She talked with her neurologist for coming in for headache cocktail and ultimately I do think that she has a migraine and no concern for infectious process or traumatic process or head bleed.  Patient given IV Compazine Benadryl Decadron dilaudid with improvement.  Recommend continued use of Tylenol and home medications.  Told to return if symptoms worsen.  Discharged in good condition.  This chart was dictated using voice recognition software.  Despite best efforts to proofread,  errors can occur which can change the documentation meaning.         Final Clinical Impression(s) / ED Diagnoses Final  diagnoses:  Other migraine without status migrainosus, not intractable    Rx / DC Orders ED Discharge Orders     None         Virgina Norfolk, DO 12/09/23 1808

## 2023-12-09 NOTE — Progress Notes (Signed)
 [facility]  Patient ID: Paula Stout,  MRN: [id number]  PCP: Sharlene Dory, DO  Date of Evaluation: 12/09/2023 Time spent:40 minutes  Virtual Visit via Video Note   I connected with pt @ on 12/09/23 at  2:00PM EST by a video enabled telemedicine application and verified that I am speaking with the correct person using two identifiers.   I discussed the limitations of evaluation and management by telemedicine and the availability of in person appointments. The patient expressed understanding and agreed to proceed.   I discussed the assessment and treatment plan with the patient. The patient was provided an opportunity to ask questions and all were answered. The patient agreed with the plan and demonstrated an understanding of the instructions.   The patient was advised to call back or seek an in-person evaluation if the symptoms worsen or if the condition fails to improve as anticipated.   I provided 40 minutes of non-face-to-face time during this encounter.  The patient was located at home.  The provider was located at [facility].     Joan Flores, NP      Chief Complaint:  Chief Complaint   Depression; Anxiety; Follow-up; Patient Education; Medication Refill; Medication Problem; Stress     HISTORY/CURRENT STATUS: HPI "Paula Stout", 68 year old female presents to this office via video visit for follow up and medication management.  Collateral information should be considered reliable. Reviewed her medication. She understands that she is medication maxed and not finding relief right now from depression. She reports her depression today at 7/10, and anxiety at 7/10.  She is sleeping 7 to 8 hours per night with the aid of Seroquel.  She denies any current mania, no history of psychosis or auditory or visual hallucinations.  No SI or HI.  Patient states that she feels safe and verbally contracts for safety today.   Past psychiatric medication  trials: Zoloft Prozac Cymbalta Lithium Depakote Valium Ativan Gabapentin Trazodone Ambien     Individual Medical History/ Review of Systems: Changes? :No   Allergies: Bee venom, Onion, Tetanus-diphtheria toxoids td, Sumatriptan, Zolpidem, Asa [aspirin], Iodine, Nsaids, Sulfa antibiotics, and Vancomycin  Current Medications:  Current Outpatient Medications:    escitalopram (LEXAPRO) 20 MG tablet, Take 1 tablet (20 mg total) by mouth daily., Disp: 30 tablet, Rfl: 2   acetaminophen (TYLENOL) 500 MG tablet, Take 1,000 mg by mouth every 6 (six) hours as needed for mild pain., Disp: , Rfl:    atorvastatin (LIPITOR) 80 MG tablet, TAKE 1 TABLET DAILY, Disp: 90 tablet, Rfl: 3   blood glucose meter kit and supplies, One Touch Ultra  Check blood sugars once daily Dx: E11.9, Disp: 1 each, Rfl: 0   botulinum toxin Type A (BOTOX) 200 units injection, Inject 155 Units into the muscle See admin instructions. Inject 155 units IM into multiple site in the face,neck and head once every 90 days, Disp: , Rfl:    budesonide-formoterol (SYMBICORT) 80-4.5 MCG/ACT inhaler, Inhale 2 puffs into the lungs 2 (two) times daily., Disp: 20.4 g, Rfl: 3   buPROPion (WELLBUTRIN XL) 300 MG 24 hr tablet, Take 1 tablet (300 mg total) by mouth daily., Disp: 90 tablet, Rfl: 1   Calcium Carbonate (CALCIUM 500 PO), Take 500 mg by mouth daily., Disp: , Rfl:    ELIQUIS 5 MG TABS tablet, TAKE 1 TABLET TWICE A DAY, Disp: 180 tablet, Rfl: 3   EPINEPHrine 0.3 mg/0.3 mL IJ SOAJ injection, Inject 0.3 mg into the muscle as needed for anaphylaxis.,  Disp: 2 each, Rfl: 1   ezetimibe (ZETIA) 10 MG tablet, TAKE 1 TABLET DAILY, Disp: 90 tablet, Rfl: 3   FARXIGA 10 MG TABS tablet, TAKE 1 TABLET DAILY, Disp: 90 tablet, Rfl: 3   fenofibrate 160 MG tablet, Take 1 tablet (160 mg total) by mouth daily., Disp: 90 tablet, Rfl: 1   fluticasone (FLONASE) 50 MCG/ACT nasal spray, Place 2 sprays into both nostrils daily., Disp: 16 g, Rfl: 6    Fremanezumab-vfrm (AJOVY) 225 MG/1.5ML SOAJ, Inject 225 mg into the skin every 30 (thirty) days., Disp: 1.68 mL, Rfl: 5   levalbuterol (XOPENEX HFA) 45 MCG/ACT inhaler, Inhale 2 puffs into the lungs every 6 (six) hours as needed for wheezing or shortness of breath., Disp: 15 g, Rfl: 2   levocetirizine (XYZAL) 5 MG tablet, TAKE 1 TABLET EVERY EVENING, Disp: 90 tablet, Rfl: 3   LORazepam (ATIVAN) 0.5 MG tablet, Take 1 tablet (0.5 mg total) by mouth every 8 (eight) hours., Disp: 30 tablet, Rfl: 1   Lurasidone HCl 120 MG TABS, Take 1 tablet (120 mg total) by mouth at bedtime., Disp: 90 tablet, Rfl: 3   magnesium oxide (MAG-OX) 400 (240 Mg) MG tablet, Take 400 mg by mouth daily., Disp: , Rfl:    metoprolol succinate (TOPROL-XL) 50 MG 24 hr tablet, Take 0.5 tablets (25 mg total) by mouth daily. Take with or immediately following a meal., Disp: , Rfl:    metoprolol tartrate (LOPRESSOR) 25 MG tablet, Take 1 tablet (25 mg total) by mouth every 4 (four) hours as needed (PALPITATIONS)., Disp: 90 tablet, Rfl: 3   MOUNJARO 12.5 MG/0.5ML Pen, INJECT 12.5 MG UNDER THE SKIN WEEKLY, Disp: 2 mL, Rfl: 12   Multiple Vitamins-Minerals (MULTIVITAMIN WITH MINERALS) tablet, Take 1 tablet by mouth daily. Daily Diabetes Health Pack - MVI + chromium, fish oil + vit D, magnesium, vit C, alpha lipoic acid w/ green tea, Disp: , Rfl:    nitroGLYCERIN (NITROSTAT) 0.4 MG SL tablet, Place 1 tablet (0.4 mg total) under the tongue every 5 (five) minutes as needed for chest pain., Disp: 25 tablet, Rfl: 11   ondansetron (ZOFRAN-ODT) 4 MG disintegrating tablet, PLACE ONE TABLET ON THE TONGUE AND ALLOW TO DISSOLVE EVERY 8 HOURS AS NEEDED FOR NAUSEA OR VOMITING, Disp: 30 tablet, Rfl: 0   ONETOUCH VERIO test strip, USE DAILY TO CHECK BLOOD SUGAR, Disp: 100 strip, Rfl: 3   pantoprazole (PROTONIX) 40 MG tablet, Take 1 tablet (40 mg total) by mouth daily., Disp: 90 tablet, Rfl: 3   QUEtiapine (SEROQUEL) 300 MG tablet, Take 1 tablet (300 mg total)  by mouth at bedtime., Disp: 90 tablet, Rfl: 3   Rimegepant Sulfate (NURTEC) 75 MG TBDP, Take 1 tablet (75 mg total) by mouth as needed (take 1 tab at the earlist onset of a migraine. Max 1 tab in 24 hours)., Disp: 10 tablet, Rfl: 5   topiramate (TOPAMAX) 50 MG tablet, TAKE 3 TABLETS TWICE A DAY, Disp: 180 tablet, Rfl: 11 Medication Side Effects: none  Family Medical/ Social History: Changes? No  MENTAL HEALTH EXAM:  There were no vitals taken for this visit.There is no height or weight on file to calculate BMI.  General Appearance: Casual, Neat, and Well Groomed  Eye Contact:  Good  Speech:  Clear and Coherent  Volume:  Normal  Mood:  NA  Affect:  Appropriate  Thought Process:  Coherent  Orientation:  Full (Time, Place, and Person)  Thought Content: Logical   Suicidal Thoughts:  No  Homicidal Thoughts:  No  Memory:  WNL  Judgement:  Good  Insight:  Good  Psychomotor Activity:  Normal  Concentration:  Concentration: Good  Recall:  Good  Fund of Knowledge: Good  Language: Good  Assets:  Desire for Improvement  ADL's:  Intact  Cognition: WNL  Prognosis:  Good    DIAGNOSES:    ICD-10-CM   1. Bipolar I disorder, most recent episode depressed (HCC)  F31.30 escitalopram (LEXAPRO) 20 MG tablet    2. Generalized anxiety disorder  F41.1 escitalopram (LEXAPRO) 20 MG tablet    3. Panic attack  F41.0 escitalopram (LEXAPRO) 20 MG tablet      Receiving Psychotherapy: No    RECOMMENDATIONS:   Greater than 50% of 40 min video visit time with patient was spent on counseling and coordination of care. We discussed her recent health concerns as well as her medications. Talked about her increase in emotional lability. Reports crying and not wanting to get out of bed. She reports taking xanax very sparingly. She did have asymptomatic bladder infection. Also on Monjouro which can cause syncope and other issues. She has lost 50 lbs. Discussed her plans to got to rehab next week and will  further address concerns.  She also is on high dose metoprolol. Also has history of A-Fib.  Reinforced with  patient to f/u and  clear any new medication with cardiologist. Will start reducing medications. She is medication maxed without much relief from depression. She came to me as new patient on current regimen except for Lexapro. Will slowly attempt to reduce Seroquel.   We agreed today to:   Will continue Seroquel to 200 mg at bedtime daily Will continue Latuda 120 mg daily To continue Wellbutrin 300 mg daily To increase Lexapro to 20  mg daily after breakfast  Will report worsening symptoms promptly Provided emergency contact information Discussed potential metabolic side effects associated with atypical antipsychotics, as well as potential risk for movement side effects. Advised pt to contact office if movement side effects occur.   Reviewed PDMP   Joan Flores, NP

## 2023-12-09 NOTE — ED Triage Notes (Signed)
 Pt states migrained HA x 2 days, takes nyrtec, was told to come her by pcp  Also states feeling palpitation and SOB that started 1 hour ago but is normal for pt and is seeing cardiology for same   States SOB is chronic issue  Reports N/V r/t migrained and sensitivity to light and sound

## 2023-12-12 ENCOUNTER — Ambulatory Visit (INDEPENDENT_AMBULATORY_CARE_PROVIDER_SITE_OTHER): Payer: Medicare (Managed Care) | Admitting: Behavioral Health

## 2023-12-12 ENCOUNTER — Encounter (INDEPENDENT_AMBULATORY_CARE_PROVIDER_SITE_OTHER): Payer: Self-pay

## 2023-12-12 ENCOUNTER — Other Ambulatory Visit: Payer: Medicare (Managed Care)

## 2023-12-12 ENCOUNTER — Encounter: Payer: Self-pay | Admitting: Behavioral Health

## 2023-12-12 DIAGNOSIS — F3130 Bipolar disorder, current episode depressed, mild or moderate severity, unspecified: Secondary | ICD-10-CM

## 2023-12-12 NOTE — Progress Notes (Signed)
 [facility] Counselor/Therapist Progress Note  Patient ID: Paula Stout, MRN: [id number],    Date: 12/12/2023 Time Spent: 58 minutes, 1 PM until 1:58 PM . The patient consented to the video teletherapy and was located in her home during this session. She is aware it is the responsibility of the patient to secure confidentiality on her end of the session. The provider was in a private home  for the duration of this session.      Treatment Type: Individual Therapy  Reported Symptoms: Anxiety, depression  Mental Status Exam: Appearance:  Casual     Behavior: Appropriate  Motor: Normal  Speech/Language:  Normal Rate  Affect: Appropriate  Mood: normal  Thought process: normal  Thought content:   WNL  Sensory/Perceptual disturbances:   WNL  Orientation: oriented to person, place, time/date, situation, day of week, month of year, and year  Attention: Good  Concentration: Good  Memory: WNL  Fund of knowledge:  Good  Insight:   Good  Judgment:  Good  Impulse Control: Good   Risk Assessment: Danger to Self:  No Self-injurious Behavior: No Danger to Others: No Duty to Warn:no Physical Aggression / Violence:No  Access to Firearms a concern: No  Gang Involvement:No   Subjective: The patient acknowledges that she has had more anxiety and panic attacks over the past week as well as an increase in depression.  They did change one of her medications to see if it could help stabilize mood.  She knows a lot of that is related to her environment.  Her daughter has some health issues to things or not getting down around the house.  She says that the big dogs in the house are getting worse and worse.  One of them snatched something out of her hand that she was eating.  The intimidate her small dog.  Her daughter did make her new food but there is no training for the dogs and the use the bathroom in the house all the time.  She said the smell so bad she does not want to go downstairs  and she only does so to let her dog out.  She is helping as much as she can around the house but her energy level is still not good.  Reducing the metoprolol did not change having dizzy spells and also increased her heart rate and palpitations.  She had another episode where she had not been holding onto doorknobs that she would have fallen.  She is going to visit her other children for about a month in the summer but is talking to him them about other possible accommodations.  We talked about some things she can do in her room as she getting more time of day including taking pictures of the dog close that she has made to post them on websites.  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  She experiences minimal anxiety and depression when she is doing those type things.  She tries to watch things which interest her in the downtime.  I encouraged continued use of relaxation breathing and challenging anxious thoughts as well as using the tips skill when recognizing a beginning increase in anxiety.   She does contract for safety having no thoughts of hurting herself or anyone else. Diagnosis: Generalized anxiety disorder, major depressive disorder, moderate, recurrent Plan: I will meet with the patient weekly via care agility.  Treatment  plan: We will use cognitive behavioral therapy as well as person centered and supportive therapy in addition to elements of dialectical behavior therapy to help reduce the patient's anxiety and depression by at least 50% with a target date of October 06, 2023.  Goals for improving depression may include having less sadness as indicated by patient report and scores on the PHQ-9, have improved mood and return to a healthier level of functioning, identify causes including environment for depressed mood and learn ways to cope with depression especially those connected to her medical issues.  Interventions  include using cognitive behavioral therapy to explore and replace thoughts and behaviors.  We will look at how depression is experienced in day-to-day living and encouraged sharing of feelings.  We will encourage the use of coping skills for management of depressive symptoms.  Goals for reducing anxiety are to improve her ability to manage anxiety symptoms, better handle stress, identify causes for anxiety and explore ways to lower it, resolve the core conflicts contributing to anxiety as well as manage thoughts and worrisome thinking contributing to feelings of anxiety.  Interventions will include providing education about anxiety, facilitate problem solution skills to help her identify options for resolving stress, teach coping skills for managing anxiety as well as mindfulness and communication in terms of family to reduce her stress level, use cognitive behavior therapy to identify and change anxiety provoking thought and behavior patterns as well as teach distress tolerance and mindfulness skills for alleviating anxiety. Progress: 30% progress notes were reviewed with a new extended target date of April 04, 2024.  [name], [facility]                  [name], [facility]               [name], [facility]               [name], [facility]               [name], [facility]               [name], [facility]               [name], [facility]               [name], [facility]               [name], [facility]               [name], [facility]                     [name], [facility]               [name], [facility]               [name], [facility]               [name], [facility]               [name], [facility]               [name],  [facility]               [name], [facility]               [name], [facility]  [name], [facility]               [name], [facility]    [name], [facility]               [name], [facility]               [name], [facility]

## 2023-12-13 ENCOUNTER — Ambulatory Visit: Payer: Medicare (Managed Care) | Attending: Family Medicine

## 2023-12-13 ENCOUNTER — Other Ambulatory Visit: Payer: Self-pay

## 2023-12-13 ENCOUNTER — Ambulatory Visit: Payer: Medicare (Managed Care) | Attending: Cardiology | Admitting: Cardiology

## 2023-12-13 VITALS — BP 78/40 | HR 56 | Ht 66.0 in | Wt 168.0 lb

## 2023-12-13 DIAGNOSIS — I482 Chronic atrial fibrillation, unspecified: Secondary | ICD-10-CM

## 2023-12-13 DIAGNOSIS — M6281 Muscle weakness (generalized): Secondary | ICD-10-CM | POA: Insufficient documentation

## 2023-12-13 DIAGNOSIS — I480 Paroxysmal atrial fibrillation: Secondary | ICD-10-CM | POA: Diagnosis not present

## 2023-12-13 DIAGNOSIS — R2689 Other abnormalities of gait and mobility: Secondary | ICD-10-CM | POA: Diagnosis not present

## 2023-12-13 DIAGNOSIS — R262 Difficulty in walking, not elsewhere classified: Secondary | ICD-10-CM | POA: Insufficient documentation

## 2023-12-13 DIAGNOSIS — E088 Diabetes mellitus due to underlying condition with unspecified complications: Secondary | ICD-10-CM

## 2023-12-13 MED ORDER — MIDODRINE HCL 2.5 MG PO TABS: 2.5000 mg | ORAL_TABLET | Freq: Three times a day (TID) | ORAL | 0 refills | Status: DC

## 2023-12-13 NOTE — Progress Notes (Signed)
 Cardiology Office Note:    Date:  12/13/2023   ID:  Paula Stout, DOB 1956/06/02, MRN [id number]  PCP:  Sharlene Dory, DO  Cardiologist:  Gypsy Balsam, MD    Referring MD: Sharlene Dory*   Chief Complaint  Patient presents with   Loss of Consciousness   Low BP    History of Present Illness:    Paula Stout is a 68 y.o. female past medical history significant for supraventricular tachycardia, paroxysmal atrial fibrillation type 2 diabetes dyslipidemia.  She comes today to my office to talk about her issue.  She described to have 7 episode of syncope.  Last episode of syncope happened about 2 weeks ago.  She woke up in the middle of the night she wanted to go to the restroom when she was coming back she became dizzy lightheaded and falling down it always happen when she is standing or when she is getting up.  Does not happen when she is sitting does not happen she is laying down.  There is no palpitation associated with this sensation.  There is no chest pain tightness squeezing pressure burning chest  Past Medical History:  Diagnosis Date   Adhesive capsulitis of left shoulder 12/16/2016   Asthma without status asthmaticus 02/06/2021   At risk for falls 02/04/2019   Atrial fibrillation (HCC) 09/01/2014   Last Assessment & Plan:  Formatting of this note might be different from the original. A:  Chronic.  Sinus rhythm at this time.  States she was told this during admission at [facility] in the past. P:  On baby aspirin at home will continue.  Low dose metoprolol started.   Bipolar 1 disorder, depressed, moderate (HCC) 02/18/2020   Capsulitis of left shoulder 10/20/2021   Chronic anticoagulation 02/20/2020   Chronic atrial fibrillation (HCC) 02/18/2020   Chronic headache 10/11/2013   Chronic interstitial cystitis 12/28/2005   Chronic migraine without aura, intractable, without status migrainosus 06/24/2017   Colon cancer screening 04/15/2016   Last  Assessment & Plan:  Formatting of this note might be different from the original. Will schedule for colonoscopy   Depression, recurrent (HCC) 02/18/2020   Diabetes mellitus (HCC)    Dyslipidemia 02/20/2020   GAD (generalized anxiety disorder) 02/18/2020   GERD (gastroesophageal reflux disease) 11/05/2021   H/O chest pain 07/31/2021   History of atrial fibrillation 06/24/2017   History of posttraumatic stress disorder (PTSD) 10/07/2016   Hyperosmolarity due to secondary diabetes mellitus (HCC) 02/06/2021   Impaired mobility and activities of daily living 11/14/2015   Irritable bowel syndrome (IBS)    Junctional tachycardia (HCC) 11/05/2021   Kidney stone 02/06/2021   Migraine without aura, not refractory 02/06/2021   Mixed hyperlipidemia 04/06/2021   Last Assessment & Plan:  Formatting of this note might be different from the original. 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 Controlled   Refractory migraine with aura 02/06/2021   Refractory migraine without aura 02/06/2021   Renal colic on left side 04/22/2017   Stage 3 chronic kidney disease (HCC) 02/20/2020   Thyroid goiter    10/17/23 CT: right thyorid goiter with substernal extension, benign FNA 07/07/23   Unspecified adverse effect of other drug,  medicinal and biological substance(995.29) 02/17/2006   Urinary tract obstruction 02/06/2021    Past Surgical History:  Procedure Laterality Date   ABDOMINAL HYSTERECTOMY     Fibroids   ATRIAL FIBRILLATION ABLATION     ATRIAL FIBRILLATION ABLATION N/A 10/27/2022   Procedure: ATRIAL FIBRILLATION ABLATION;  Surgeon: Regan Lemming, MD;  Location: [facility] INVASIVE  CV LAB;  Service: Cardiovascular;  Laterality: N/A;   BRONCHIAL BIOPSY  11/08/2023   Procedure: BRONCHOSCOPY, WITH BIOPSY;  Surgeon: Leslye Peer, MD;  Location: [facility] ENDOSCOPY;  Service: Pulmonary;;   BRONCHIAL BRUSHINGS  11/08/2023   Procedure: BRONCHOSCOPY, WITH BRUSH BIOPSY;  Surgeon: Leslye Peer, MD;  Location: [facility] ENDOSCOPY;  Service: Pulmonary;;   BRONCHIAL NEEDLE ASPIRATION BIOPSY  11/08/2023   Procedure: BRONCHOSCOPY, WITH NEEDLE ASPIRATION BIOPSY;  Surgeon: Leslye Peer, MD;  Location: [facility] ENDOSCOPY;  Service: Pulmonary;;   BRONCHIAL WASHINGS  11/08/2023   Procedure: IRRIGATION, BRONCHUS;  Surgeon: Leslye Peer, MD;  Location: [facility] ENDOSCOPY;  Service: Pulmonary;;   CARDIAC CATHETERIZATION     CARDIOVERSION     CESAREAN SECTION     KNEE SURGERY     LEFT HEART CATH AND CORONARY ANGIOGRAPHY N/A 03/04/2020   Procedure: LEFT HEART CATH AND CORONARY ANGIOGRAPHY;  Surgeon: Lyn Records, MD;  Location: [facility] INVASIVE CV LAB;  Service: Cardiovascular;  Laterality: N/A;   ROTATOR CUFF REPAIR     SVT ABLATION N/A 05/04/2023   Procedure: SVT ABLATION;  Surgeon: Regan Lemming, MD;  Location: [facility] INVASIVE CV LAB;  Service: Cardiovascular;  Laterality: N/A;    Current Medications: Current Meds  Medication Sig   acetaminophen (TYLENOL) 500 MG tablet Take 1,000 mg by mouth every 6 (six) hours as needed for mild pain.   atorvastatin (LIPITOR) 80 MG tablet TAKE 1 TABLET DAILY   blood glucose meter kit and supplies One Touch Ultra  Check 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 One Touch Ultra  Check blood sugars once daily Dx: E11.9)   budesonide-formoterol (SYMBICORT) 80-4.5 MCG/ACT inhaler Inhale 2 puffs into the lungs 2 (two) times daily.   buPROPion (WELLBUTRIN XL) 300 MG 24 hr tablet Take 1 tablet (300 mg total) by mouth daily.   Calcium Carbonate (CALCIUM 500 PO) Take 500 mg by mouth daily.   diltiazem (CARDIZEM CD) 120  MG 24 hr capsule Take 120 mg by mouth daily.   ELIQUIS 5 MG TABS tablet TAKE 1 TABLET TWICE A DAY   EPINEPHrine 0.3 mg/0.3 mL IJ SOAJ injection Inject 0.3 mg into the muscle as needed for anaphylaxis.   escitalopram (LEXAPRO) 20 MG tablet Take 1 tablet (20 mg total) by mouth daily.   ezetimibe (ZETIA) 10 MG tablet TAKE 1 TABLET DAILY   FARXIGA 10 MG TABS tablet TAKE 1 TABLET DAILY   fenofibrate 160 MG tablet Take 1 tablet (160 mg total) by mouth daily.   fluticasone (FLONASE) 50 MCG/ACT nasal spray Place 2 sprays into both nostrils daily.   Fremanezumab-vfrm (AJOVY) 225 MG/1.5ML SOAJ Inject 225 mg into the skin every 30 (thirty) days.   levocetirizine (XYZAL) 5 MG tablet TAKE 1 TABLET EVERY EVENING   LORazepam (ATIVAN) 0.5 MG tablet Take 1 tablet (0.5 mg total) by mouth every 8 (eight) hours.   Lurasidone HCl 120 MG TABS Take 1 tablet (120 mg total) by mouth at bedtime.   magnesium oxide (MAG-OX) 400 (240 Mg) MG tablet Take 400 mg by mouth daily.  metoprolol succinate (TOPROL-XL) 50 MG 24 hr tablet Take 0.5 tablets (25 mg total) by mouth daily. Take with or immediately following a meal.   metoprolol tartrate (LOPRESSOR) 25 MG tablet Take 1 tablet (25 mg total) by mouth every 4 (four) hours as needed (PALPITATIONS).   MOUNJARO 12.5 MG/0.5ML Pen INJECT 12.5 MG UNDER THE SKIN WEEKLY (Patient taking differently: Inject 12.5 mg into the skin once a week.)   Multiple Vitamins-Minerals (MULTIVITAMIN WITH MINERALS) tablet Take 1 tablet by mouth daily. Daily Diabetes Health Pack - MVI + chromium, fish oil + vit D, magnesium, vit C, alpha lipoic acid w/ green tea   nitroGLYCERIN (NITROSTAT) 0.4 MG SL tablet Place 1 tablet (0.4 mg total) under the tongue every 5 (five) minutes as needed for chest pain.   ondansetron (ZOFRAN-ODT) 4 MG disintegrating tablet PLACE ONE TABLET ON THE TONGUE AND ALLOW TO DISSOLVE EVERY 8 HOURS AS NEEDED FOR NAUSEA OR VOMITING (Patient taking differently: Take 4 mg by mouth every 8  (eight) hours as needed for nausea or vomiting.)   ONETOUCH VERIO test strip USE DAILY TO CHECK BLOOD SUGAR (Patient taking differently: 1 each by Other route See admin instructions. Use daily to check blood sugar.)   pantoprazole (PROTONIX) 40 MG tablet Take 1 tablet (40 mg total) by mouth daily.   QUEtiapine (SEROQUEL) 300 MG tablet Take 1 tablet (300 mg total) by mouth at bedtime.   Rimegepant Sulfate (NURTEC) 75 MG TBDP Take 1 tablet (75 mg total) by mouth as needed (take 1 tab at the earlist onset of a migraine. Max 1 tab in 24 hours).   topiramate (TOPAMAX) 50 MG tablet TAKE 3 TABLETS TWICE A DAY     Allergies:   Bee venom, Onion, Tetanus-diphtheria toxoids td, Sumatriptan, Zolpidem, Asa [aspirin], Iodine, Nsaids, Sulfa antibiotics, and Vancomycin   Social History   Socioeconomic History   Marital status: Widowed    Spouse name: Not on file   Number of children: 6   Years of education: Not on file   Highest education level: Associate degree: academic program  Occupational History   Occupation: not employed  Tobacco Use   Smoking status: Never    Passive exposure: Past   Smokeless tobacco: Never   Tobacco comments:    Never smoke 07/15/22  Vaping Use   Vaping status: Never Used  Substance and Sexual Activity   Alcohol use: Never    Comment: light social   Drug use: Never   Sexual activity: Not Currently  Other Topics Concern   Not on file  Social History Narrative   Lives in [place] with daughter and son-in-law.  Sews clothing in free time as hobby.    Social Drivers of Corporate investment banker Strain: Low Risk  (10/11/2023)   Overall Financial Resource Strain (CARDIA)    Difficulty of Paying Living Expenses: Not very hard  Recent Concern: Financial Resource Strain - Medium Risk (07/24/2023)   Overall Financial Resource Strain (CARDIA)    Difficulty of Paying Living Expenses: Somewhat hard  Food Insecurity: No Food Insecurity (10/11/2023)   Hunger Vital Sign     Worried About Running Out of Food in the Last Year: Never true    Ran Out of Food in the Last Year: Never true  Transportation Needs: No Transportation Needs (10/11/2023)   PRAPARE - Administrator, Civil Service (Medical): No    Lack of Transportation (Non-Medical): No  Physical Activity: Inactive (10/11/2023)   Exercise Vital Sign  Days of Exercise per Week: 0 days    Minutes of Exercise per Session: 0 min  Stress: Stress Concern Present (10/11/2023)   [employer] of Occupational Health - Occupational Stress Questionnaire    Feeling of Stress : To some extent  Social Connections: Socially Isolated (10/11/2023)   Social Connection and Isolation Panel [NHANES]    Frequency of Communication with Friends and Family: Three times a week    Frequency of Social Gatherings with Friends and Family: Never    Attends Religious Services: Never    Database administrator or Organizations: No    Attends Banker Meetings: Never    Marital Status: Widowed     Family History: The patient's family history includes Bipolar disorder in her daughter and son; Migraines in her daughter, daughter, and son. She was adopted. ROS:   Please see the history of present illness.    All 14 point review of systems negative except as described per history of present illness  EKGs/Labs/Other Studies Reviewed:    EKG Interpretation Date/Time:  Tuesday December 13 2023 16:16:48 EDT Ventricular Rate:  67 PR Interval:  198 QRS Duration:  92 QT Interval:  444 QTC Calculation: 469 R Axis:   -37  Text Interpretation: Sinus rhythm with Premature atrial complexes Left axis deviation Low voltage QRS Cannot rule out Anterior infarct (cited on or before 09-Dec-2023) When compared with ECG of 09-Dec-2023 15:54, Premature atrial complexes are now Present Nonspecific T wave abnormality, improved in Lateral leads Confirmed by Gypsy Balsam [phone]) on 12/13/2023 4:37:38 PM    Recent Labs: 05/15/2023: B  Natriuretic Peptide 62.1 05/20/2023: TSH 0.85 11/28/2023: ALT 21; BUN 17; Creatinine, Ser 1.77; Hemoglobin 11.4; Platelets 341.0; Potassium 3.9; Sodium 140  Recent Lipid Panel    Component Value Date/Time   CHOL 121 11/28/2023 1117   CHOL 141 02/10/2021 0914   TRIG 164.0 (H) 11/28/2023 1117   HDL 31.90 (L) 11/28/2023 1117   HDL 26 (L) 02/10/2021 0914   CHOLHDL 4 11/28/2023 1117   VLDL 32.8 11/28/2023 1117   LDLCALC 56 11/28/2023 1117   LDLCALC 86 02/10/2021 0914    Physical Exam:    VS:  BP (!) 78/40 (BP Location: Right Arm, Patient Position: Sitting)   Pulse (!) 56   Ht 5\' 6"  (1.676 m)   Wt 168 lb (76.2 kg)   SpO2 99%   BMI 27.12 kg/m     Wt Readings from Last 3 Encounters:  12/13/23 168 lb (76.2 kg)  12/09/23 169 lb 8.5 oz (76.9 kg)  11/28/23 169 lb 9.6 oz (76.9 kg)     GEN:  Well nourished, well developed in no acute distress HEENT: Normal NECK: No JVD; No carotid bruits LYMPHATICS: No lymphadenopathy CARDIAC: RRR, no murmurs, no rubs, no gallops RESPIRATORY:  Clear to auscultation without rales, wheezing or rhonchi  ABDOMEN: Soft, non-tender, non-distended MUSCULOSKELETAL:  No edema; No deformity  SKIN: Warm and dry LOWER EXTREMITIES: no swelling NEUROLOGIC:  Alert and oriented x 3 PSYCHIATRIC:  Normal affect   ASSESSMENT:    1. Paroxysmal atrial fibrillation (HCC)   2. Chronic atrial fibrillation (HCC)   3. Diabetes mellitus due to underlying condition with unspecified complications (HCC)    PLAN:    In order of problems listed above:  Episodes of syncope I suspect orthostatic hypotension play significant role here.  I encouraged her to drink plenty of fluid to liberate salt intake.  I will also give her prescription for midodrine 2.53 times a  day.  I will see her back within the next 2 months to see how she does. Chronic atrial fibrillation it is incorrect diagnosis she is maintaining sinus rhythm, will change this to paroxysmal atrial fibrillation.   Anticoagulated which I will continue. Type 2 diabetes well-controlled hemoglobin A1c 4.7.  She is taking Mounjaro her appetite is low ask her to try to eat a little more and especially drink plenty of fluids  Her initial blood pressure was very low 78/40 she was fine she felt okay she said she is just elevated week I recheck it it was 84/40, she received 2 glasses of water after that her systolic blood pressure went up to 108 systolic.  Medication Adjustments/Labs and Tests Ordered: Current medicines are reviewed at length with the patient today.  Concerns regarding medicines are outlined above.  Orders Placed This Encounter  Procedures   EKG 12-Lead   Medication changes: No orders of the defined types were placed in this encounter.   Signed, Georgeanna Lea, MD, [facility] 12/13/2023 5:11 PM    [facility]

## 2023-12-13 NOTE — Therapy (Addendum)
 OUTPATIENT PHYSICAL THERAPY LOWER EXTREMITY EVALUATION   Patient Name: Paula Stout MRN: [id number] DOB:[birth year], 68 y.o., female Today's Date: 12/13/2023  END OF SESSION:  PT End of Session - 12/13/23 1539     Date for PT Re-Evaluation 02/07/24    Progress Note Due on Visit 10    PT Start Time 1448    PT Stop Time 1535    PT Time Calculation (min) 47 min    Activity Tolerance Patient tolerated treatment well    Behavior During Therapy [facility] for tasks assessed/performed             Past Medical History:  Diagnosis Date   Adhesive capsulitis of left shoulder 12/16/2016   Asthma without status asthmaticus 02/06/2021   At risk for falls 02/04/2019   Atrial fibrillation (HCC) 09/01/2014   Last Assessment & Plan:  Formatting of this note might be different from the original. A:  Chronic.  Sinus rhythm at this time.  States she was told this during admission at [facility] in the past. P:  On baby aspirin  at home will continue.  Low dose metoprolol  started.   Bipolar 1 disorder, depressed, moderate (HCC) 02/18/2020   Capsulitis of left shoulder 10/20/2021   Chronic anticoagulation 02/20/2020   Chronic atrial fibrillation (HCC) 02/18/2020   Chronic headache 10/11/2013   Chronic interstitial cystitis 12/28/2005   Chronic migraine without aura, intractable, without status migrainosus 06/24/2017   Colon cancer screening 04/15/2016   Last Assessment & Plan:  Formatting of this note might be different from the original. Will schedule for colonoscopy   Depression, recurrent (HCC) 02/18/2020   Diabetes mellitus (HCC)    Dyslipidemia 02/20/2020   GAD (generalized anxiety disorder) 02/18/2020   GERD (gastroesophageal reflux disease) 11/05/2021   H/O chest pain 07/31/2021   History of atrial fibrillation 06/24/2017   History of posttraumatic stress disorder (PTSD) 10/07/2016   Hyperosmolarity due to secondary diabetes mellitus (HCC) 02/06/2021   Impaired mobility and activities of  daily living 11/14/2015   Irritable bowel syndrome (IBS)    Junctional tachycardia (HCC) 11/05/2021   Kidney stone 02/06/2021   Migraine without aura, not refractory 02/06/2021   Mixed hyperlipidemia 04/06/2021   Last Assessment & Plan:  Formatting of this note might be different from the original. 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 Controlled   Refractory migraine with aura 02/06/2021   Refractory migraine without aura 02/06/2021   Renal colic on left side 04/22/2017   Stage 3 chronic kidney disease (HCC) 02/20/2020   Thyroid  goiter    10/17/23 CT: right thyorid goiter with substernal extension, benign FNA 07/07/23   Unspecified adverse effect of other drug, medicinal and biological substance(995.29) 02/17/2006   Urinary tract obstruction 02/06/2021   Past Surgical History:  Procedure Laterality Date   ABDOMINAL HYSTERECTOMY     Fibroids   ATRIAL FIBRILLATION ABLATION     ATRIAL FIBRILLATION ABLATION N/A 10/27/2022   Procedure: ATRIAL FIBRILLATION ABLATION;  Surgeon: Inocencio Soyla Lunger, MD;  Location: [facility] INVASIVE CV LAB;  Service: Cardiovascular;  Laterality: N/A;   BRONCHIAL BIOPSY  11/08/2023   Procedure: BRONCHOSCOPY, WITH BIOPSY;  Surgeon: Shelah Lamar RAMAN, MD;  Location: [facility] ENDOSCOPY;  Service: Pulmonary;;  BRONCHIAL BRUSHINGS  11/08/2023   Procedure: BRONCHOSCOPY, WITH BRUSH BIOPSY;  Surgeon: Shelah Lamar RAMAN, MD;  Location: [facility] ENDOSCOPY;  Service: Pulmonary;;   BRONCHIAL NEEDLE ASPIRATION BIOPSY  11/08/2023   Procedure: BRONCHOSCOPY, WITH NEEDLE ASPIRATION BIOPSY;  Surgeon: Shelah Lamar RAMAN, MD;  Location: [facility] ENDOSCOPY;  Service: Pulmonary;;   BRONCHIAL  WASHINGS  11/08/2023   Procedure: IRRIGATION, BRONCHUS;  Surgeon: Shelah Lamar RAMAN, MD;  Location: [facility] ENDOSCOPY;  Service: Pulmonary;;   CARDIAC CATHETERIZATION     CARDIOVERSION     CESAREAN SECTION     KNEE SURGERY     LEFT HEART CATH AND CORONARY ANGIOGRAPHY N/A 03/04/2020   Procedure: LEFT HEART CATH AND CORONARY ANGIOGRAPHY;  Surgeon: Claudene Victory ORN, MD;  Location: [facility] INVASIVE CV LAB;  Service: Cardiovascular;  Laterality: N/A;   ROTATOR CUFF REPAIR     SVT ABLATION N/A 05/04/2023   Procedure: SVT ABLATION;  Surgeon: Inocencio Soyla Lunger, MD;  Location: [facility] INVASIVE CV LAB;  Service: Cardiovascular;  Laterality: N/A;   Patient Active Problem List   Diagnosis Date Noted   Syncope 09/18/2023   Enlarged pulmonary artery (HCC) 08/11/2023   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   Right thyroid  nodule 06/23/2023   Pulmonary nodule 1 cm or greater in diameter 06/08/2023   Hypercoagulable state due to paroxysmal atrial fibrillation (HCC) 11/24/2022   Pericarditis 10/27/2022   Junctional tachycardia (HCC) 11/05/2021   GERD (gastroesophageal reflux disease) 11/05/2021   Paroxysmal supraventricular tachycardia (HCC) 11/05/2021   Capsulitis of left shoulder 10/20/2021   ACS (acute coronary syndrome) (HCC) 07/31/2021   Primary hypertension 04/06/2021   Mixed hyperlipidemia 04/06/2021   Hyperglycemia due to type 2 diabetes mellitus (HCC) 02/06/2021   Hyperosmolarity due to secondary diabetes mellitus (HCC) 02/06/2021   Insomnia 02/06/2021   Kidney stone 02/06/2021   Polyneuropathy due to type 2 diabetes mellitus (HCC) 02/06/2021   Urinary tract obstruction 02/06/2021   Asthma without status asthmaticus 02/06/2021   Migraine without aura, not refractory 02/06/2021   Refractory migraine with aura 02/06/2021   Refractory migraine without aura 02/06/2021   Depressed bipolar I disorder (HCC) 02/06/2021   SVT (supraventricular tachycardia) (HCC)    Paroxysmal atrial fibrillation (HCC)     Bipolar disorder (HCC)    Diabetes mellitus without complication (HCC)    Diabetes mellitus due to underlying condition with unspecified complications (HCC) 04/14/2020   Anxiety    Depression    Diabetes mellitus (HCC)    Irritable bowel syndrome (IBS)    Dyslipidemia 02/20/2020   Chronic anticoagulation 02/20/2020   Stage 3 chronic kidney disease (HCC) 02/20/2020   Chest pain 02/19/2020   Hypertriglyceridemia 02/18/2020   Depression, recurrent (HCC) 02/18/2020   Bipolar 1 disorder, depressed, moderate (HCC) 02/18/2020   GAD (generalized anxiety disorder) 02/18/2020   Chronic atrial fibrillation (HCC) 02/18/2020   At risk for falls 02/04/2019   Chronic migraine without aura, intractable, without status migrainosus 06/24/2017   History of atrial fibrillation 06/24/2017   Renal colic on left side 04/22/2017   Adhesive capsulitis of left shoulder 12/16/2016   Morbid obesity (HCC) 12/16/2016   History of posttraumatic stress disorder (PTSD) 10/07/2016   Colon cancer screening 04/15/2016   Heartburn 04/15/2016   Atrial fibrillation (HCC) 09/01/2014   Obesity (BMI 30-39.9) 09/01/2014   Asthma 09/01/2014   Unstable angina (HCC) 09/01/2014   HLD (hyperlipidemia) 09/01/2014   Type 2 diabetes mellitus with obesity (HCC) 10/11/2013   Migraines 10/11/2013   Bipolar 1 disorder (HCC) 10/11/2013   Chronic  headache 10/11/2013   Dysmetabolic syndrome X 03/20/2008   Unspecified adverse effect of other drug, medicinal and biological substance(995.29) 02/17/2006   Chronic interstitial cystitis 12/28/2005   Pneumonia, organism unspecified(486) 11/21/2002    PCP: Frann Netter, DO  REFERRING PROVIDER: same  REFERRING DIAG: balance deficits L ankle and coxxyx pain  THERAPY DIAG:  Difficulty in walking, not elsewhere classified  Muscle weakness (generalized)  Rationale for Evaluation and Treatment: Rehabilitation  ONSET DATE: Jan 2025  SUBJECTIVE:   SUBJECTIVE STATEMENT: The  patient reports that she has had numerous health issues over the last year, has up to 2 syncopal episodes a day, which has contributed to her falls and subsequent L ankle pain and coccyx pain.  Has lost 60 pounds in the last year purposefully. To see cardiologist today, not sure that her syncopal episodes will improve at this time. Does feel unsteady with household activities   PERTINENT HISTORY: Multiple medical issues, atrial fibrillation, DM, chronic anti coagulation, recently several syncopal episodes of unknown origin with several falls. PAIN:  Are you having pain? Yes: NPRS scale: L ankle and coccyx Pain location: see above Pain description: aching, sore Aggravating factors: sitting for coccyx, walking L ankle Relieving factors: leans to side to sit  PRECAUTIONS: Fall and Other: syncopal episodes  RED FLAGS: None   WEIGHT BEARING RESTRICTIONS: No  FALLS:  Has patient fallen in last 6 months? Yes. Number of falls 3  LIVING ENVIRONMENT: Lives with: lives with their family and lives with their daughter Lives in: House/apartment Stairs: flight of steps indoors, bedrooms on second floor  Has following equipment at home: None  OCCUPATION: not working  PLOF: Independent with basic ADLs and Independent with gait  PATIENT GOALS: build my strength , walk more steadily  NEXT MD VISIT: unclear  OBJECTIVE:  Note: Objective measures were completed at Evaluation unless otherwise noted.  DIAGNOSTIC FINDINGS: na  PATIENT SURVEYS:  LEFS Lower Extremity Functional Score: 23 / 80 = 28.7 %  COGNITION: Overall cognitive status: Within functional limits for tasks assessed     SENSATION: WFL  EDEMA:  None noted  POSTURE: L lower leg with atrophy plantarflexors as compared to R, overall lean appearance, decreased muscle mass throughout  PALPATION: na  LOWER EXTREMITY ROM: grossly wfl LOWER EXTREMITY MMT:  MMT Right eval Left eval  Hip flexion    Hip extension    Hip  abduction    Hip adduction    Hip internal rotation    Hip external rotation    Knee flexion    Knee extension 4+ 4  Ankle dorsiflexion    Ankle plantarflexion 4 3+  Ankle inversion    Ankle eversion 4 3+   (Blank rows = not tested)  FUNCTIONAL TESTS:  5 times sit to stand: 32sec  GAIT: Distance walked: 92' in clinic Assistive device utilized: None Level of assistance: Modified independence Comments: na  TREATMENT DATE: 12/13/23: Evaluation, instructed in the following ex to assist with functional strength: Seated L ankle plantarflexion with black t band Sit to stand from her bed , B UE support, 5 reps, advised to perform with meals     PATIENT EDUCATION:  Education details: POC, goals Person educated: Patient Education method: Explanation, Demonstration, Tactile cues, and Verbal cues Education comprehension: verbalized understanding, returned demonstration, and verbal cues required  HOME EXERCISE PROGRAM: TBD  ASSESSMENT:  CLINICAL IMPRESSION: Patient is a 68 y.o. female who was evaluated today for physical therapy , referred by her PCP due to multiple falls,  upon evaluation today she indicated that most of her falls have been due to syncopal episodes, she experiences at least 2 a day.  Hs soreness and weakness L ankle, noted instability with L ankle with B heel raises, also L calf atrophy.  Pain in coccyx with sitting, provided with wedge pillow today during evaluation and she did have relief so she plans to order one for home.  She should benefit from skilled PT to address her generalized weakness as well as her stability and balance,  would perform most of her ex, activities in recumbent or seated position initially due to her syncope, had one near syncopal episode today with therapist with standing heel raises.   OBJECTIVE IMPAIRMENTS:  cardiopulmonary status limiting activity, decreased activity tolerance, decreased balance, decreased endurance, decreased mobility, difficulty walking, decreased strength, and pain.   ACTIVITY LIMITATIONS: carrying, lifting, bending, standing, squatting, stairs, transfers, and locomotion level  PARTICIPATION LIMITATIONS: meal prep, cleaning, laundry, shopping, and community activity  PERSONAL FACTORS: Behavior pattern, Fitness, Past/current experiences, Time since onset of injury/illness/exacerbation, and 1-2 comorbidities: bipolar, DM, atrial fib are also affecting patient's functional outcome.   REHAB POTENTIAL: Fair    CLINICAL DECISION MAKING: Evolving/moderate complexity  EVALUATION COMPLEXITY: Moderate   GOALS: Goals reviewed with patient? Yes  SHORT TERM GOALS: Target date: 2 weeks, 12/27/23 I HEP, Initiated today Baseline: Goal status: INITIAL   LONG TERM GOALS: Target date: 8 weeks, 02/07/24  5 x sit to stand improve to 12.6 sec or less,  Baseline: 32 sec , B UE support Goal status: INITIAL  2.  MMT B ankle plantarflexion improve to 5/5 for better stability Baseline: 3+/5 Goal status: INITIAL  3.  Complete FGA and establish goals Baseline:  Goal status: INITIAL  4. Able to walk 800' for community distance safely and I  Baseline: TBA Goal status: INITIAL    PLAN:  PT FREQUENCY: 2x/week  PT DURATION: 8 weeks  PLANNED INTERVENTIONS: 97110-Therapeutic exercises, 97530- Therapeutic activity, 97112- Neuromuscular re-education, 97535- Self Care, and [id number]- Manual therapy  PLAN FOR NEXT SESSION: initiate cardiovascular with recumbent cycle or Nustep, strengthening B LE's  04/17/24:  the patient has not returned for her remaining appts. DC at this time.   Liesa Tsan L Karmyn Lowman, PT, DPT, OCS 12/13/2023, 4:25 PM

## 2023-12-13 NOTE — Patient Instructions (Signed)
 Medication Instructions:   START: Midodrine 2.5mg  three times daily   Lab Work: None Ordered If you have labs (blood work) drawn today and your tests are completely normal, you will receive your results only by: MyChart Message (if you have MyChart) OR A paper copy in the mail If you have any lab test that is abnormal or we need to change your treatment, we will call you to review the results.   Testing/Procedures: None Ordered   Follow-Up: At [facility], you and your health needs are our priority.  As part of our continuing mission to provide you with exceptional heart care, we have created designated Provider Care Teams.  These Care Teams include your primary Cardiologist (physician) and Advanced Practice Providers (APPs -  Physician Assistants and Nurse Practitioners) who all work together to provide you with the care you need, when you need it.  We recommend signing up for the patient portal called "MyChart".  Sign up information is provided on this After Visit Summary.  MyChart is used to connect with patients for Virtual Visits (Telemedicine).  Patients are able to view lab/test results, encounter notes, upcoming appointments, etc.  Non-urgent messages can be sent to your provider as well.   To learn more about what you can do with MyChart, go to [email or web address].    Your next appointment:   2 month(s)  The format for your next appointment:   In Person  Provider:   Gypsy Balsam, MD    Other Instructions NA

## 2023-12-20 ENCOUNTER — Ambulatory Visit: Payer: Medicare (Managed Care)

## 2023-12-22 ENCOUNTER — Ambulatory Visit: Payer: Medicare (Managed Care) | Admitting: Behavioral Health

## 2023-12-22 ENCOUNTER — Encounter: Payer: Self-pay | Admitting: Behavioral Health

## 2023-12-22 DIAGNOSIS — F411 Generalized anxiety disorder: Secondary | ICD-10-CM

## 2023-12-22 DIAGNOSIS — F319 Bipolar disorder, unspecified: Secondary | ICD-10-CM

## 2023-12-22 DIAGNOSIS — F331 Major depressive disorder, recurrent, moderate: Secondary | ICD-10-CM | POA: Diagnosis not present

## 2023-12-22 NOTE — Progress Notes (Signed)
 [facility] Counselor/Therapist Progress Note  Patient ID: Paula Stout, MRN: [id number],    Date: 12/22/2023 Time Spent: 56 minutes, 2 PM until 2:56 PM . The patient consented to the video teletherapy and was located in her home during this session. She is aware it is the responsibility of the patient to secure confidentiality on her end of the session. The provider was in a private home  for the duration of this session.      Treatment Type: Individual Therapy  Reported Symptoms: Anxiety, depression  Mental Status Exam: Appearance:  Casual     Behavior: Appropriate  Motor: Normal  Speech/Language:  Normal Rate  Affect: Appropriate  Mood: normal  Thought process: normal  Thought content:   WNL  Sensory/Perceptual disturbances:   WNL  Orientation: oriented to person, place, time/date, situation, day of week, month of year, and year  Attention: Good  Concentration: Good  Memory: WNL  Fund of knowledge:  Good  Insight:   Good  Judgment:  Good  Impulse Control: Good   Risk Assessment: Danger to Self:  No Self-injurious Behavior: No Danger to Others: No Duty to Warn:no Physical Aggression / Violence:No  Access to Firearms a concern: No  Gang Involvement:No   Subjective: The patient had another presyncope episode this morning.  She was sitting on the stool in the kitchen and said she started feeling dizzy.  She says she saw 3 of her dog but was able to sit still for a minute but was dizzy until she got upstairs and laid down.  Her daughter told her that was the episode earlier where she was there but the patient has no recollection of what happened for about 5 minutes.  She had an episode in her doctor's office where her blood pressure was significantly low and drinking 2 cups of water got up to minimal levels so they are looking at ways to raise her blood pressure including starting a new medication.  The doctor even told her to eat more salty foods.  They are  trying to find the balance between stopping the episodes of keeping her blood pressure and a comfortable rate.  For the most part she stays in her room going on downstairs when she has to.  Her daughter has her own health issues and is not very supportive and her son-in-law does not do much to help either one of them which she knows she can do nothing about.  She is going to [place] this summer for about 6 weeks and is looking forward to that but would like to be a little more stable medically before flying out there.  She is being patient with the process but it is frustrating.  She is having anxiety but I encouraged her to continue to practice use of coping skills consistently even when not necessarily anxious.  Work is still going well and that is a good distraction for her.   She does contract for safety having no thoughts of hurting herself or anyone else. Diagnosis: Generalized anxiety disorder, major depressive disorder, moderate, recurrent Plan: I will meet with the patient weekly via care agility.  Treatment plan: We will use cognitive behavioral therapy as well as person centered and supportive therapy in addition to elements of dialectical behavior therapy to help reduce the patient's anxiety and depression by at least 50% with a target date of October 06, 2023.  Goals for improving depression may include having less sadness as indicated by patient report and scores on  the PHQ-9, have improved mood and return to a healthier level of functioning, identify causes including environment for depressed mood and learn ways to cope with depression especially those connected to her medical issues.  Interventions include using cognitive behavioral therapy to explore and replace thoughts and behaviors.  We will look at how depression is experienced in day-to-day living and encouraged sharing of feelings.  We will encourage the use of coping skills for management of depressive symptoms.  Goals for  reducing anxiety are to improve her ability to manage anxiety symptoms, better handle stress, identify causes for anxiety and explore ways to lower it, resolve the core conflicts contributing to anxiety as well as manage thoughts and worrisome thinking contributing to feelings of anxiety.  Interventions will include providing education about anxiety, facilitate problem solution skills to help her identify options for resolving stress, teach coping skills for managing anxiety as well as mindfulness and communication in terms of family to reduce her stress level, use cognitive behavior therapy to identify and change anxiety provoking thought and behavior patterns as well as teach distress tolerance and mindfulness skills for alleviating anxiety. Progress: 30% progress notes were reviewed with a new extended target date of April 04, 2024.  Cecile Coder, [facility]

## 2023-12-23 ENCOUNTER — Other Ambulatory Visit: Payer: Self-pay | Admitting: Family Medicine

## 2023-12-23 DIAGNOSIS — K219 Gastro-esophageal reflux disease without esophagitis: Secondary | ICD-10-CM

## 2023-12-27 ENCOUNTER — Ambulatory Visit: Payer: Medicare (Managed Care) | Admitting: Audiologist

## 2023-12-28 ENCOUNTER — Encounter: Payer: Self-pay | Admitting: Behavioral Health

## 2023-12-28 ENCOUNTER — Ambulatory Visit (INDEPENDENT_AMBULATORY_CARE_PROVIDER_SITE_OTHER): Payer: Medicare (Managed Care) | Admitting: Behavioral Health

## 2023-12-28 DIAGNOSIS — F331 Major depressive disorder, recurrent, moderate: Secondary | ICD-10-CM | POA: Diagnosis not present

## 2023-12-28 DIAGNOSIS — F410 Panic disorder [episodic paroxysmal anxiety] without agoraphobia: Secondary | ICD-10-CM | POA: Diagnosis not present

## 2023-12-28 DIAGNOSIS — F411 Generalized anxiety disorder: Secondary | ICD-10-CM

## 2023-12-28 NOTE — Progress Notes (Signed)
 [facility] Counselor/Therapist Progress Note  Patient ID: Paula Stout, MRN: [id number],    Date: 12/28/2023 Time Spent: 53 minutes, 3;06PM until 3:59 PM . The patient consented to the video teletherapy and was located in her home during this session. She is aware it is the responsibility of the patient to secure confidentiality on her end of the session. The provider was in a private home  for the duration of this session.      Treatment Type: Individual Therapy  Reported Symptoms: Anxiety, depression  Mental Status Exam: Appearance:  Casual     Behavior: Appropriate  Motor: Normal  Speech/Language:  Normal Rate  Affect: Appropriate  Mood: normal  Thought process: normal  Thought content:   WNL  Sensory/Perceptual disturbances:   WNL  Orientation: oriented to person, place, time/date, situation, day of week, month of year, and year  Attention: Good  Concentration: Good  Memory: WNL  Fund of knowledge:  Good  Insight:   Good  Judgment:  Good  Impulse Control: Good   Risk Assessment: Danger to Self:  No Self-injurious Behavior: No Danger to Others: No Duty to Warn:no Physical Aggression / Violence:No  Access to Firearms a concern: No  Gang Involvement:No   Subjective: The patient has not had as many syncope episodes estimating that she has had 3 in the past week which she is thankful about but said her blood pressure has been low continually.  She did call a cardiologist at the first appointment she can get us  in about 3 weeks.  The only change that made with her medication is to lower the metoprolol  and that does not seem to have helped.  Over the past couple of months especially after dinner she vomits of what ever she has been eating which is measurable.  She can eat a lot she did not affect her but dinner always seems to have that effect and it does not matter what she eats when she eats etc.  She does not think it is related to the Mounjaro  because she  has been on that since October of last year.  She is isolating more because her daughter and son-in-law are getting frustrated with her dog barking more but she says her dog only barks in response to her daughter's too big dogs approaching her dog.  I encouraged her to try to go outside when her daughter was not there or in another part of the house but she says her daughter stays in the den area almost all the time including sleeping on the sofa so there is not much time her daughter is not in the area where she has to go through with her dog to let them out.  Her daughter does not acknowledge the fact that her dog is doing something to provoke the patient's.  The patient also is getting more anxious about driving anywhere.  She canceled an audiologist appointment yesterday because she did not think she could go there.  She has an appointment tomorrow so we looked at several different ways that she could gradually workable a back into going there.  Tomorrow I suggested that she leave significantly early stopping 3-4 times on what would be about a 20-minute ride.  I encouraged her to do some relaxation breathing techniques before she left as well as cognitively look challenge some of the anxiety that comes and I encouraged her to do that in the 2 or 3 times that she stopped to see if that makes it  easier.  She thinks when she can get there she can go and okay. She does contract for safety having no thoughts of hurting herself or anyone else. Diagnosis: Generalized anxiety disorder, major depressive disorder, moderate, recurrent Plan: I will meet with the patient weekly via care agility.  Treatment plan: We will use cognitive behavioral therapy as well as person centered and supportive therapy in addition to elements of dialectical behavior therapy to help reduce the patient's anxiety and depression by at least 50% with a target date of October 06, 2023.  Goals for improving depression may include having less  sadness as indicated by patient report and scores on the PHQ-9, have improved mood and return to a healthier level of functioning, identify causes including environment for depressed mood and learn ways to cope with depression especially those connected to her medical issues.  Interventions include using cognitive behavioral therapy to explore and replace thoughts and behaviors.  We will look at how depression is experienced in day-to-day living and encouraged sharing of feelings.  We will encourage the use of coping skills for management of depressive symptoms.  Goals for reducing anxiety are to improve her ability to manage anxiety symptoms, better handle stress, identify causes for anxiety and explore ways to lower it, resolve the core conflicts contributing to anxiety as well as manage thoughts and worrisome thinking contributing to feelings of anxiety.  Interventions will include providing education about anxiety, facilitate problem solution skills to help her identify options for resolving stress, teach coping skills for managing anxiety as well as mindfulness and communication in terms of family to reduce her stress level, use cognitive behavior therapy to identify and change anxiety provoking thought and behavior patterns as well as teach distress tolerance and mindfulness skills for alleviating anxiety. Progress: 30% progress notes were reviewed with a new extended target date of April 04, 2024.  Cecile Coder, [facility]                                         Cecile Coder, [facility]

## 2023-12-29 ENCOUNTER — Ambulatory Visit: Payer: Medicare (Managed Care)

## 2023-12-30 ENCOUNTER — Encounter: Payer: Self-pay | Admitting: Cardiology

## 2023-12-30 ENCOUNTER — Other Ambulatory Visit: Payer: Self-pay

## 2023-12-30 MED ORDER — MIDODRINE HCL 5 MG PO TABS: 5.0000 mg | ORAL_TABLET | Freq: Three times a day (TID) | ORAL | 3 refills | Status: DC

## 2024-01-03 ENCOUNTER — Other Ambulatory Visit: Payer: Self-pay | Admitting: Family Medicine

## 2024-01-05 LAB — ACID FAST CULTURE WITH REFLEXED SENSITIVITIES (MYCOBACTERIA): Acid Fast Culture: NEGATIVE

## 2024-01-09 ENCOUNTER — Emergency Department (HOSPITAL_BASED_OUTPATIENT_CLINIC_OR_DEPARTMENT_OTHER)
Admission: EM | Admit: 2024-01-09 | Discharge: 2024-01-09 | Disposition: A | Discharge status: Home / Self Care | Payer: Medicare (Managed Care) | Attending: Emergency Medicine | Admitting: Emergency Medicine

## 2024-01-09 ENCOUNTER — Emergency Department (HOSPITAL_BASED_OUTPATIENT_CLINIC_OR_DEPARTMENT_OTHER): Payer: Medicare (Managed Care)

## 2024-01-09 ENCOUNTER — Encounter (HOSPITAL_BASED_OUTPATIENT_CLINIC_OR_DEPARTMENT_OTHER): Payer: Self-pay

## 2024-01-09 DIAGNOSIS — J45909 Unspecified asthma, uncomplicated: Secondary | ICD-10-CM | POA: Insufficient documentation

## 2024-01-09 DIAGNOSIS — Z7951 Long term (current) use of inhaled steroids: Secondary | ICD-10-CM | POA: Insufficient documentation

## 2024-01-09 DIAGNOSIS — E119 Type 2 diabetes mellitus without complications: Secondary | ICD-10-CM | POA: Diagnosis not present

## 2024-01-09 DIAGNOSIS — R42 Dizziness and giddiness: Secondary | ICD-10-CM | POA: Diagnosis not present

## 2024-01-09 DIAGNOSIS — Z7901 Long term (current) use of anticoagulants: Secondary | ICD-10-CM | POA: Insufficient documentation

## 2024-01-09 DIAGNOSIS — Z79899 Other long term (current) drug therapy: Secondary | ICD-10-CM | POA: Diagnosis not present

## 2024-01-09 DIAGNOSIS — I129 Hypertensive chronic kidney disease with stage 1 through stage 4 chronic kidney disease, or unspecified chronic kidney disease: Secondary | ICD-10-CM | POA: Insufficient documentation

## 2024-01-09 DIAGNOSIS — R519 Headache, unspecified: Secondary | ICD-10-CM | POA: Diagnosis not present

## 2024-01-09 DIAGNOSIS — R0789 Other chest pain: Secondary | ICD-10-CM | POA: Diagnosis not present

## 2024-01-09 DIAGNOSIS — N183 Chronic kidney disease, stage 3 unspecified: Secondary | ICD-10-CM | POA: Insufficient documentation

## 2024-01-09 DIAGNOSIS — R112 Nausea with vomiting, unspecified: Secondary | ICD-10-CM | POA: Diagnosis not present

## 2024-01-09 DIAGNOSIS — G43909 Migraine, unspecified, not intractable, without status migrainosus: Secondary | ICD-10-CM | POA: Diagnosis present

## 2024-01-09 LAB — TROPONIN T, HIGH SENSITIVITY
Troponin T High Sensitivity: <15 ng/L (ref <19)
Troponin T High Sensitivity: <15 ng/L (ref <19)

## 2024-01-09 LAB — BASIC METABOLIC PANEL WITH GFR
Anion gap: 13 (ref 5–15)
BUN: 14 mg/dL (ref 8–23)
CO2: 19 mmol/L — ABNORMAL LOW (ref 22–32)
Calcium: 9.9 mg/dL (ref 8.9–10.3)
Chloride: 106 mmol/L (ref 98–111)
Creatinine, Ser: 1.85 mg/dL — ABNORMAL HIGH (ref 0.44–1.00)
GFR, Estimated: 30 mL/min — ABNORMAL LOW (ref >60)
Glucose, Bld: 82 mg/dL (ref 70–99)
Potassium: 3.8 mmol/L (ref 3.5–5.1)
Sodium: 138 mmol/L (ref 135–145)

## 2024-01-09 LAB — CBC
HCT: 35.3 % — ABNORMAL LOW (ref 36.0–46.0)
Hemoglobin: 11.8 g/dL — ABNORMAL LOW (ref 12.0–15.0)
MCH: 31.6 pg (ref 26.0–34.0)
MCHC: 33.4 g/dL (ref 30.0–36.0)
MCV: 94.4 fL (ref 80.0–100.0)
Platelets: 303 10*3/uL (ref 150–400)
RBC: 3.74 MIL/uL — ABNORMAL LOW (ref 3.87–5.11)
RDW: 14.1 % (ref 11.5–15.5)
WBC: 5.4 10*3/uL (ref 4.0–10.5)
nRBC: 0 % (ref 0.0–0.2)

## 2024-01-09 MED ORDER — METOCLOPRAMIDE HCL 5 MG/ML IJ SOLN
10.0000 mg | Freq: Once | INTRAMUSCULAR | Status: AC
  Administered 2024-01-09: 10 mg via INTRAVENOUS
  Filled 2024-01-09: qty 2

## 2024-01-09 MED ORDER — SODIUM CHLORIDE 0.9 % IV BOLUS
1000.0000 mL | Freq: Once | INTRAVENOUS | Status: AC
  Administered 2024-01-09: 1000 mL via INTRAVENOUS

## 2024-01-09 MED ORDER — DEXAMETHASONE SODIUM PHOSPHATE 10 MG/ML IJ SOLN
10.0000 mg | Freq: Once | INTRAMUSCULAR | Status: AC
  Administered 2024-01-09: 10 mg via INTRAVENOUS
  Filled 2024-01-09: qty 1

## 2024-01-09 MED ORDER — HYDROMORPHONE HCL 1 MG/ML IJ SOLN
1.0000 mg | Freq: Once | INTRAMUSCULAR | Status: AC
  Administered 2024-01-09: 1 mg via INTRAVENOUS
  Filled 2024-01-09: qty 1

## 2024-01-09 MED ORDER — ACETAMINOPHEN 500 MG PO TABS
1000.0000 mg | ORAL_TABLET | Freq: Once | ORAL | Status: AC
  Administered 2024-01-09: 1000 mg via ORAL
  Filled 2024-01-09: qty 2

## 2024-01-09 MED ORDER — DIPHENHYDRAMINE HCL 50 MG/ML IJ SOLN
12.5000 mg | Freq: Once | INTRAMUSCULAR | Status: AC
  Administered 2024-01-09: 12.5 mg via INTRAVENOUS
  Filled 2024-01-09: qty 1

## 2024-01-09 MED ORDER — ONDANSETRON HCL 4 MG PO TABS: 4.0000 mg | ORAL_TABLET | Freq: Four times a day (QID) | ORAL | 0 refills | Status: DC

## 2024-01-09 NOTE — ED Triage Notes (Signed)
 C/o migraine x 2 days with vomiting, light sensitivity, taking nurtec, states maxed out on it. States has chest pain "all the time" but its been worse today. Also c/o shortness of breath and dizziness.

## 2024-01-09 NOTE — ED Provider Notes (Signed)
 [facility] Provider Note   CSN: [id number] Arrival date & time: 01/09/24  1651     History  Chief Complaint  Patient presents with   Migraine   Chest Pain    Paula Stout is a 68 y.o. female with past medical history as listed below presents with complaints of worsening migraine and chest pain.  Patient frequently gets migraines.  States she maxed out on her Nurtec.  Is not associated with blurry vision but does endorse some dizziness with standing.  States she woke up with the migraine 2 days ago.  Notes that she has had some nausea and vomiting with this.  No abdominal pain.  States she lives with chest pain due to A-fib and SVT.  Has had multiple ablations and follows with cardiology.  Denies any shortness of breath.  No cough or congestion.   Migraine Associated symptoms include chest pain.  Chest Pain  Past Medical History:  Diagnosis Date   Adhesive capsulitis of left shoulder 12/16/2016   Asthma without status asthmaticus 02/06/2021   At risk for falls 02/04/2019   Atrial fibrillation (HCC) 09/01/2014   Last Assessment & Plan:  Formatting of this note might be different from the original. A:  Chronic.  Sinus rhythm at this time.  States she was told this during admission at [facility] in the past. P:  On baby aspirin  at home will continue.  Low dose metoprolol  started.   Bipolar 1 disorder, depressed, moderate (HCC) 02/18/2020   Capsulitis of left shoulder 10/20/2021   Chronic anticoagulation 02/20/2020   Chronic atrial fibrillation (HCC) 02/18/2020   Chronic headache 10/11/2013   Chronic interstitial cystitis 12/28/2005   Chronic migraine without aura, intractable, without status migrainosus 06/24/2017   Colon cancer screening 04/15/2016   Last Assessment & Plan:  Formatting of this note might be different from the original. Will schedule for colonoscopy   Depression, recurrent (HCC) 02/18/2020   Diabetes mellitus (HCC)     Dyslipidemia 02/20/2020   GAD (generalized anxiety disorder) 02/18/2020   GERD (gastroesophageal reflux disease) 11/05/2021   H/O chest pain 07/31/2021   History of atrial fibrillation 06/24/2017   History of posttraumatic stress disorder (PTSD) 10/07/2016   Hyperosmolarity due to secondary diabetes mellitus (HCC) 02/06/2021   Impaired mobility and activities of daily living 11/14/2015   Irritable bowel syndrome (IBS)    Junctional tachycardia (HCC) 11/05/2021   Kidney stone 02/06/2021   Migraine without aura, not refractory 02/06/2021   Mixed hyperlipidemia 04/06/2021   Last Assessment & Plan:  Formatting of this note might be different from the original. 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 Controlled   Refractory migraine with aura 02/06/2021   Refractory migraine without aura 02/06/2021   Renal colic on left side 04/22/2017   Stage 3 chronic kidney disease (HCC) 02/20/2020   Thyroid  goiter    10/17/23 CT: right thyorid goiter with substernal extension, benign FNA 07/07/23   Unspecified adverse effect of other drug, medicinal and biological substance(995.29) 02/17/2006   Urinary tract obstruction 02/06/2021   Past Surgical History:  Procedure Laterality Date   ABDOMINAL HYSTERECTOMY     Fibroids   ATRIAL FIBRILLATION  ABLATION     ATRIAL FIBRILLATION ABLATION N/A 10/27/2022   Procedure: ATRIAL FIBRILLATION ABLATION;  Surgeon: Lei Pump, MD;  Location: [facility]VASIVE [facility];  Service: Cardiovascular;  Laterality: N/A;   BRONCHIAL BIOPSY  11/08/2023   Procedure: BRONCHOSCOPY, WITH BIOPSY;  Surgeon: Denson Flake, MD;  Location: [facility] ENDOSCOPY;   Service: Pulmonary;;   BRONCHIAL BRUSHINGS  11/08/2023   Procedure: BRONCHOSCOPY, WITH BRUSH BIOPSY;  Surgeon: Denson Flake, MD;  Location: [facility] ENDOSCOPY;  Service: Pulmonary;;   BRONCHIAL NEEDLE ASPIRATION BIOPSY  11/08/2023   Procedure: BRONCHOSCOPY, WITH NEEDLE ASPIRATION BIOPSY;  Surgeon: Denson Flake, MD;  Location: [facility] ENDOSCOPY;  Service: Pulmonary;;   BRONCHIAL WASHINGS  11/08/2023   Procedure: IRRIGATION, BRONCHUS;  Surgeon: Denson Flake, MD;  Location: [facility] ENDOSCOPY;  Service: Pulmonary;;   CARDIAC CATHETERIZATION     CARDIOVERSION     CESAREAN SECTION     KNEE SURGERY     LEFT HEART CATH AND CORONARY ANGIOGRAPHY N/A 03/04/2020   Procedure: LEFT HEART CATH AND CORONARY ANGIOGRAPHY;  Surgeon: Arty Binning, MD;  Location: [facility];  Service: Cardiovascular;  Laterality: N/A;   ROTATOR CUFF REPAIR     SVT ABLATION N/A 05/04/2023   Procedure: SVT ABLATION;  Surgeon: Lei Pump, MD;  Location: [facility];  Service: Cardiovascular;  Laterality: N/A;       Home Medications Prior to Admission medications   Medication Sig Start Date End Date Taking? Authorizing Provider  acetaminophen  (TYLENOL ) 500 MG tablet Take 1,000 mg by mouth every 6 (six) hours as needed for mild pain.    [provider]  atorvastatin  (LIPITOR ) 80 MG tablet TAKE 1 TABLET DAILY 01/03/24   Jobe Mulder, DO  blood glucose meter kit and supplies One Touch Ultra  Check blood sugars once daily Dx: E11.9 Patient taking differently: 1 each by Other route See admin instructions. One Touch Ultra  Check blood sugars once daily Dx: E11.9 10/01/21   Jobe Mulder, DO  budesonide -formoterol  (SYMBICORT ) 80-4.5 MCG/ACT inhaler Inhale 2 puffs into the lungs 2 (two) times daily. 08/11/23   Cobb, Mariah Shines, NP  buPROPion  (WELLBUTRIN  XL) 300 MG 24 hr tablet Take 1 tablet (300 mg total) by mouth daily. 10/19/23   Lincoln Renshaw, NP  Calcium  Carbonate (CALCIUM  500 PO) Take 500 mg  by mouth daily.    [provider]  diltiazem  (CARDIZEM  CD) 120 MG 24 hr capsule Take 120 mg by mouth daily. 12/08/23   [provider]  ELIQUIS  5 MG TABS tablet TAKE 1 TABLET TWICE A DAY 02/02/23   Wendling, Shellie Dials, DO  EPINEPHrine  0.3 mg/0.3 mL IJ SOAJ injection Inject 0.3 mg into the muscle as needed for anaphylaxis. 12/09/23   Jobe Mulder, DO  escitalopram  (LEXAPRO ) 20 MG tablet Take 1 tablet (20 mg total) by mouth daily. 12/09/23   Lincoln Renshaw, NP  ezetimibe  (ZETIA ) 10 MG tablet TAKE 1 TABLET DAILY 12/26/23   Jobe Mulder, DO  FARXIGA  10 MG TABS tablet TAKE 1 TABLET DAILY 08/09/23   Jobe Mulder, DO  fenofibrate  160 MG tablet Take 1 tablet (160 mg total) by mouth daily. 09/21/23   Jobe Mulder, DO  fluticasone  (FLONASE ) 50 MCG/ACT nasal spray Place 2 sprays into both nostrils daily. 01/29/23   Yevette Hem, FNP  Fremanezumab -vfrm (AJOVY ) 225 MG/1.5ML SOAJ Inject 225 mg into the skin every 30 (thirty) days. 11/28/23   Merriam Abbey,  DO  levocetirizine (XYZAL ) 5 MG tablet TAKE 1 TABLET EVERY EVENING 12/01/23   Wendling, Shellie Dials, DO  LORazepam  (ATIVAN ) 0.5 MG tablet Take 1 tablet (0.5 mg total) by mouth every 8 (eight) hours. 10/19/23   Lincoln Renshaw, NP  Lurasidone  HCl 120 MG TABS Take 1 tablet (120 mg total) by mouth at bedtime. 10/19/23   Lincoln Renshaw, NP  magnesium  oxide (MAG-OX) 400 (240 Mg) MG tablet Take 400 mg by mouth daily.    [provider]  metoprolol  succinate (TOPROL -XL) 50 MG 24 hr tablet Take 0.5 tablets (25 mg total) by mouth daily. Take with or immediately following a meal. 11/28/23 02/26/24  Wendling, Shellie Dials, DO  metoprolol  tartrate (LOPRESSOR ) 25 MG tablet Take 1 tablet (25 mg total) by mouth every 4 (four) hours as needed (PALPITATIONS). 08/24/22   Camnitz, Babetta Lesch, MD  midodrine  (PROAMATINE ) 5 MG tablet Take 1 tablet (5 mg total) by mouth 3 (three) times daily with meals. 12/30/23    Krasowski, Robert J, MD  MOUNJARO  12.5 MG/0.5ML Pen INJECT 12.5 MG UNDER THE SKIN WEEKLY Patient taking differently: Inject 12.5 mg into the skin once a week. 07/18/23   Jobe Mulder, DO  Multiple Vitamins-Minerals (MULTIVITAMIN WITH MINERALS) tablet Take 1 tablet by mouth daily. Daily Diabetes Health Pack - MVI + chromium, fish oil + vit D, magnesium , vit C, alpha lipoic acid w/ green tea    [provider]  nitroGLYCERIN  (NITROSTAT ) 0.4 MG SL tablet Place 1 tablet (0.4 mg total) under the tongue every 5 (five) minutes as needed for chest pain. 03/16/23   Krasowski, Robert J, MD  ondansetron  (ZOFRAN -ODT) 4 MG disintegrating tablet PLACE ONE TABLET ON THE TONGUE AND ALLOW TO DISSOLVE EVERY 8 HOURS AS NEEDED FOR NAUSEA OR VOMITING Patient taking differently: Take 4 mg by mouth every 8 (eight) hours as needed for nausea or vomiting. 11/29/23   Gwenette Lennox, Shellie Dials, DO  ONETOUCH VERIO test strip USE DAILY TO CHECK BLOOD SUGAR Patient taking differently: 1 each by Other route See admin instructions. Use daily to check blood sugar. 10/29/22   Jobe Mulder, DO  pantoprazole  (PROTONIX ) 40 MG tablet TAKE 1 TABLET DAILY 12/26/23   Jobe Mulder, DO  QUEtiapine  (SEROQUEL ) 300 MG tablet Take 1 tablet (300 mg total) by mouth at bedtime. 10/19/23   Lincoln Renshaw, NP  Rimegepant Sulfate (NURTEC) 75 MG TBDP Take 1 tablet (75 mg total) by mouth as needed (take 1 tab at the earlist onset of a migraine. Max 1 tab in 24 hours). 04/04/23   Merriam Abbey, DO  topiramate  (TOPAMAX ) 50 MG tablet TAKE 3 TABLETS TWICE A DAY 08/23/23   Jobe Mulder, DO      Allergies    Bee venom, Onion, Tetanus-diphtheria toxoids td, Sumatriptan, Zolpidem , Asa [aspirin ], Iodine, Nsaids, Sulfa antibiotics, and Vancomycin    Review of Systems   Review of Systems  Cardiovascular:  Positive for chest pain.    Physical Exam Updated Vital Signs BP 136/76   Pulse 81   Temp 97.7 F (36.5  C) (Oral)   Resp 17   Ht 5\' 6"  (1.676 m)   Wt 68 kg   SpO2 100%   BMI 24.21 kg/m  Physical Exam Vitals and nursing note reviewed.  Constitutional:      General: She is not in acute distress.    Appearance: She is well-developed.  HENT:     Head: Normocephalic and atraumatic.  Eyes:  Conjunctiva/sclera: Conjunctivae normal.  Cardiovascular:     Rate and Rhythm: Normal rate and regular rhythm.     Heart sounds: No murmur heard. Pulmonary:     Effort: Pulmonary effort is normal. No respiratory distress.     Breath sounds: Normal breath sounds.  Abdominal:     Palpations: Abdomen is soft.     Tenderness: There is no abdominal tenderness.  Musculoskeletal:        General: No swelling.     Cervical back: Neck supple.  Skin:    General: Skin is warm and dry.     Capillary Refill: Capillary refill takes less than 2 seconds.  Neurological:     Mental Status: She is alert.     Comments: Patient is alert and oriented. There is no abnormal phonation. Symmetric smile without facial droop. Moves all extremities spontaneously. 5/5 strength in upper and lower extremities. . No sensation deficit. There is no nystagmus. EOMI, PERRL. Coordination intact with finger to nose and normal ambulation.    Psychiatric:        Mood and Affect: Mood normal.     ED Results / Procedures / Treatments   Labs (all labs ordered are listed, but only abnormal results are displayed) Labs Reviewed  BASIC METABOLIC PANEL WITH GFR - Abnormal; Notable for the following components:      Result Value   CO2 19 (*)    Creatinine, Ser 1.85 (*)    GFR, Estimated 30 (*)    All other components within normal limits  CBC - Abnormal; Notable for the following components:   RBC 3.74 (*)    Hemoglobin 11.8 (*)    HCT 35.3 (*)    All other components within normal limits  TROPONIN T, HIGH SENSITIVITY  TROPONIN T, HIGH SENSITIVITY    EKG EKG Interpretation Date/Time:  Monday Jan 09 2024 17:04:23  EDT Ventricular Rate:  87 PR Interval:  192 QRS Duration:  88 QT Interval:  438 QTC Calculation: 527 R Axis:   -45  Text Interpretation: Sinus rhythm Left anterior fascicular block Abnormal R-wave progression, late transition Borderline T abnormalities, anterior leads Prolonged QT interval Confirmed by Annita Kindle [phone]) on 01/09/2024 6:32:02 PM  Radiology DG Chest 2 View Result Date: 01/09/2024 CLINICAL DATA:  Chest pain. EXAM: CHEST - 2 VIEW COMPARISON:  11/23/2023, CT 10/17/2023 FINDINGS: Normal heart size and mediastinal contours. The right lower lobe nodular density on prior CT is not seen on the current exam. No evidence of acute airspace disease. No pulmonary edema, large pleural effusion or pneumothorax. No acute osseous abnormalities are seen. IMPRESSION: 1. No acute chest findings. 2. The right lower lobe nodular density on prior CT is not seen on the current exam. Electronically Signed   By: Chadwick Colonel M.D.   On: 01/09/2024 17:41    Procedures Procedures    Medications Ordered in ED Medications  sodium chloride  0.9 % bolus 1,000 mL (0 mLs Intravenous Stopped 01/09/24 2018)  HYDROmorphone  (DILAUDID ) injection 1 mg (1 mg Intravenous Given 01/09/24 1818)  metoCLOPramide  (REGLAN ) injection 10 mg (10 mg Intravenous Given 01/09/24 1816)  diphenhydrAMINE  (BENADRYL ) injection 12.5 mg (12.5 mg Intravenous Given 01/09/24 1819)  dexamethasone  (DECADRON ) injection 10 mg (10 mg Intravenous Given 01/09/24 1817)  acetaminophen  (TYLENOL ) tablet 1,000 mg (1,000 mg Oral Given 01/09/24 1914)    ED Course/ Medical Decision Making/ A&P  Medical Decision Making Amount and/or Complexity of Data Reviewed Labs: ordered. Radiology: ordered.  Risk OTC drugs. Prescription drug management.   This patient presents to the ED with chief complaint(s) of headache.  The complaint involves an extensive differential diagnosis and also carries with it a high risk of  complications and morbidity.  Pertinent past medical history as listed in HPI  The differential diagnosis includes  Migraine/tension/cluster headache, meningitis, intracranial hemorrhage, CVA, TIA, vertebral dissection, URI, TBI, giant cell The initial plan is to   Additional history obtained: Records reviewed Care Everywhere/External Records  Initial Assessment:   Patient presents with complaints of headache and chest pain.  Headache was not sudden or maximal in onset.  It is not worse of lifetime.  On exam she has no neurodeficits.  No suspicion for CVA, TIA or dissection.  She has no meningismus.  She is able to ambulate.  No cough or congestion to suggest URI.  No injury or trauma to suggest TBI.  Clinical exam and history not consistent with giant cell arteritis.  She gets migraines frequently.  States that she maxed out on her Nurtec.  Her current migraine feels consistent with her typical migraines.  It is associated with nausea vomiting without any abdominal pain.  Has no abdominal tenderness on exam.  CT and MR imaging of her brain have showed no significant abnormality.  Do not feel that repeat imaging is indicated at this time.  Will give migraine cocktail as requested and continue to monitor.  States that she always lives with chest pain, however it seems to be worse recently.  Not associated with shortness of breath.  Does have a history of A-fib on Eliquis  and SVT status post ablations.  Follows with cardiology regularly.  EKG showed sinus rhythm with borderline T wave abnormalities.  Chest x-ray without cardiopulmonary disease.  Her lungs are clear, no cough, she is afebrile.  No suspicion for pneumonia.  Troponin without elevation.  Low suspicion for ACS.  Independent ECG interpretation:  Sinus rhythm with nonspecific T wave abnormality and poor R wave progression  Independent labs interpretation:  The following labs were independently interpreted:  BMP with elevated creatinine to  1.85, near baseline, CBC without leukocytosis, hemoglobin stable, troponin without elevation  Independent visualization and interpretation of imaging: I independently visualized the following imaging with scope of interpretation limited to determining acute life threatening conditions related to emergency care: Chest x-ray, which revealed no cardiopulmonary disease  Treatment and Reassessment: Patient given migraine cocktail as she has had notable improvement with this in the past  On reassessment patient reports improvement of symptoms.  She is tolerating p.o. amenable to discharge.  Consultations obtained:   none  Disposition:   Patient will be discharged home. The patient has been appropriately medically screened and/or stabilized in the ED. I have low suspicion for any other emergent medical condition which would require further screening, evaluation or treatment in the ED or require inpatient management. At time of discharge the patient is hemodynamically stable and in no acute distress. I have discussed work-up results and diagnosis with patient and answered all questions. Patient is agreeable with discharge plan. We discussed strict return precautions for returning to the emergency department and they verbalized understanding.     Social Determinants of Health:   none  This note was dictated with voice recognition software.  Despite best efforts at proofreading, errors may have occurred which can change the documentation meaning.  Final Clinical Impression(s) / ED Diagnoses Final diagnoses:  Bad headache  Atypical chest pain    Rx / DC Orders ED Discharge Orders     None         Felicie Horning, PA-C 01/09/24 2123    Merdis Stalling, MD 01/11/24 2121

## 2024-01-09 NOTE — Discharge Instructions (Addendum)
 Your evaluated in the emergency room for a migraine and chest pain.  Your lab work did not show any significant abnormality.  If your symptoms persist please follow-up with your primary care doctor.

## 2024-01-10 ENCOUNTER — Ambulatory Visit: Payer: Medicare (Managed Care) | Admitting: Behavioral Health

## 2024-01-10 DIAGNOSIS — F331 Major depressive disorder, recurrent, moderate: Secondary | ICD-10-CM | POA: Diagnosis not present

## 2024-01-10 DIAGNOSIS — F411 Generalized anxiety disorder: Secondary | ICD-10-CM | POA: Diagnosis not present

## 2024-01-11 ENCOUNTER — Ambulatory Visit (INDEPENDENT_AMBULATORY_CARE_PROVIDER_SITE_OTHER): Payer: Medicare (Managed Care) | Admitting: Family Medicine

## 2024-01-11 ENCOUNTER — Encounter: Payer: Self-pay | Admitting: Family Medicine

## 2024-01-11 VITALS — BP 122/70 | HR 74 | Temp 98.0°F | Resp 16 | Ht 66.0 in | Wt 161.0 lb

## 2024-01-11 DIAGNOSIS — R4182 Altered mental status, unspecified: Secondary | ICD-10-CM | POA: Diagnosis not present

## 2024-01-11 DIAGNOSIS — E1165 Type 2 diabetes mellitus with hyperglycemia: Secondary | ICD-10-CM

## 2024-01-11 DIAGNOSIS — T50905A Adverse effect of unspecified drugs, medicaments and biological substances, initial encounter: Secondary | ICD-10-CM

## 2024-01-11 DIAGNOSIS — Z7985 Long-term (current) use of injectable non-insulin antidiabetic drugs: Secondary | ICD-10-CM

## 2024-01-11 MED ORDER — TIRZEPATIDE 7.5 MG/0.5ML ~~LOC~~ SOAJ: 7.5000 mg | SUBCUTANEOUS | 1 refills | Status: DC

## 2024-01-11 NOTE — Progress Notes (Signed)
 Chief Complaint  Patient presents with   Weight Loss    Weight and Nausea     Subjective: Patient is a 68 y.o. female here for ER f/u  For the past several months she has been dealing with hypoglycemia, dizziness, brain fog, nausea, vomiting, abdominal pain, and constipation.  She is currently taking Mounjaro  12.5 mg weekly.  She is compliant.  Last A1c was 4.7.  She has lost around 65 pounds and seems to still be losing weight.  Past Medical History:  Diagnosis Date   Adhesive capsulitis of left shoulder 12/16/2016   Asthma without status asthmaticus 02/06/2021   At risk for falls 02/04/2019   Atrial fibrillation (HCC) 09/01/2014   Last Assessment & Plan:  Formatting of this note might be different from the original. A:  Chronic.  Sinus rhythm at this time.  States she was told this during admission at [facility] in the past. P:  On baby aspirin  at home will continue.  Low dose metoprolol  started.   Bipolar 1 disorder, depressed, moderate (HCC) 02/18/2020   Capsulitis of left shoulder 10/20/2021   Chronic anticoagulation 02/20/2020   Chronic atrial fibrillation (HCC) 02/18/2020   Chronic headache 10/11/2013   Chronic interstitial cystitis 12/28/2005   Chronic migraine without aura, intractable, without status migrainosus 06/24/2017   Colon cancer screening 04/15/2016   Last Assessment & Plan:  Formatting of this note might be different from the original. Will schedule for colonoscopy   Depression, recurrent (HCC) 02/18/2020   Diabetes mellitus (HCC)    Dyslipidemia 02/20/2020   GAD (generalized anxiety disorder) 02/18/2020   GERD (gastroesophageal reflux disease) 11/05/2021   H/O chest pain 07/31/2021   History of atrial fibrillation 06/24/2017   History of posttraumatic stress disorder (PTSD) 10/07/2016   Hyperosmolarity due to secondary diabetes mellitus (HCC) 02/06/2021   Impaired mobility and activities of daily living 11/14/2015   Irritable bowel syndrome (IBS)    Junctional  tachycardia (HCC) 11/05/2021   Kidney stone 02/06/2021   Migraine without aura, not refractory 02/06/2021   Mixed hyperlipidemia 04/06/2021   Last Assessment & Plan:  Formatting of this note might be different from the original. 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 Controlled   Refractory migraine with aura 02/06/2021   Refractory migraine without aura 02/06/2021   Renal colic on left side 04/22/2017   Stage 3 chronic kidney disease (HCC) 02/20/2020   Thyroid  goiter    10/17/23 CT: right thyorid goiter with substernal extension, benign FNA 07/07/23   Unspecified adverse effect of other drug, medicinal and biological substance(995.29) 02/17/2006   Urinary tract obstruction 02/06/2021    Objective: BP 122/70 (BP Location: Left Arm, Patient Position: Sitting)   Pulse 74   Temp 98 F (36.7 C) (Oral)   Resp 16   Ht 5\' 6"  (1.676 m)   Wt 161 lb (73 kg)   SpO2 96%   BMI 25.99 kg/m  General: Awake, appears stated age Heart: RRR, no LE edema Lungs: CTAB, no rales, wheezes or rhonchi. No accessory muscle use Mouth: MMM Abdomen: Bowel sounds present, soft, nondistended, diffuse TTP Psych: Age appropriate judgment and insight, normal affect and mood  Assessment and Plan: Type 2 diabetes  mellitus with hyperglycemia, without long-term current use of insulin  (HCC) - Plan: tirzepatide  (MOUNJARO ) 7.5 MG/0.5ML Pen  Adverse effect of drug, initial encounter  Altered mental status, unspecified altered mental status type - Plan: TSH, CBC, Comprehensive metabolic panel with GFR, B12  Chronic issue that is currently controlled.  We will decrease her Mounjaro  to 7.5 mg  weekly from 12.5 mg weekly. As above.  She will let me know in the next 3 to 4 weeks if not significantly improving. Check labs in case there are nutritional deficiencies leading to memory changes. The patient voiced understanding and agreement to the plan.  Paula Stout [place], DO 01/11/24  2:20 PM

## 2024-01-11 NOTE — Patient Instructions (Addendum)
Give us 2-3 business days to get the results of your labs back.   Stay hydrated.  Let us know if you need anything.  

## 2024-01-12 ENCOUNTER — Encounter: Payer: Self-pay | Admitting: Family Medicine

## 2024-01-12 LAB — CBC
HCT: 34.3 % — ABNORMAL LOW (ref 36.0–46.0)
Hemoglobin: 11.1 g/dL — ABNORMAL LOW (ref 12.0–15.0)
MCHC: 32.4 g/dL (ref 30.0–36.0)
MCV: 96.8 fl (ref 78.0–100.0)
Platelets: 292 10*3/uL (ref 150.0–400.0)
RBC: 3.54 Mil/uL — ABNORMAL LOW (ref 3.87–5.11)
RDW: 14.7 % (ref 11.5–15.5)
WBC: 5.5 10*3/uL (ref 4.0–10.5)

## 2024-01-12 LAB — COMPREHENSIVE METABOLIC PANEL WITH GFR
ALT: 13 U/L (ref 0–35)
AST: 25 U/L (ref 0–37)
Albumin: 3.5 g/dL (ref 3.5–5.2)
Alkaline Phosphatase: 49 U/L (ref 39–117)
BUN: 17 mg/dL (ref 6–23)
CO2: 21 meq/L (ref 19–32)
Calcium: 9.5 mg/dL (ref 8.4–10.5)
Chloride: 109 meq/L (ref 96–112)
Creatinine, Ser: 1.9 mg/dL — ABNORMAL HIGH (ref 0.40–1.20)
GFR: 27.21 mL/min — ABNORMAL LOW (ref >60.00)
Glucose, Bld: 87 mg/dL (ref 70–99)
Potassium: 3.6 meq/L (ref 3.5–5.1)
Sodium: 140 meq/L (ref 135–145)
Total Bilirubin: 0.6 mg/dL (ref 0.2–1.2)
Total Protein: 6.1 g/dL (ref 6.0–8.3)

## 2024-01-12 LAB — TSH: TSH: 0.85 u[IU]/mL (ref 0.35–5.50)

## 2024-01-12 LAB — VITAMIN B12: Vitamin B-12: 136 pg/mL — ABNORMAL LOW (ref 211–911)

## 2024-01-13 ENCOUNTER — Other Ambulatory Visit: Payer: Self-pay

## 2024-01-13 ENCOUNTER — Ambulatory Visit: Payer: Medicare (Managed Care)

## 2024-01-13 DIAGNOSIS — R4189 Other symptoms and signs involving cognitive functions and awareness: Secondary | ICD-10-CM | POA: Diagnosis not present

## 2024-01-15 LAB — INTRINSIC FACTOR ANTIBODIES: Intrinsic Factor: NEGATIVE

## 2024-01-16 NOTE — Progress Notes (Deleted)
 NEUROLOGY FOLLOW UP OFFICE NOTE  Paula Stout [id number]  Assessment/Plan:   Migraine with and without aura, WITH status migrainosus, intractable - unclear trigger.     Check urgent MRI of brain with and without contrast Migraine prevention.  Continue Botox  for now, as it has overall been effective.  Also on topiramate  150mg  at bedtime.  Will add Ajovy  every 28 days Migraine rescue:  Nurtec, Zofran  Follow up 4 months         Subjective:  Paula Stout is a 68 year old left-handed female with paroxysmal atrial fibrillation, PSVT s/p ablation (now with bradycardia and hypotension due to medications), DM II, depression, anxiety who follows up for migraines.  UPDATE: Last seen in November.  2 months previously, in September, she woke up with a full blown migraine which has continued to be persistent.  She was seen in the ED on September where she had a CT head that revealed no acute changes.  She was advised to take Nurtec once daily during that period of time.  The migraines are exacerbating her chronic chest pain as well.  She was seen in the ED on subsequent occasions in October and twice in November month, last 2 days ago.  She is not aware of any particularly trigger.  She has maybe been on the computer a little more but nothing significant.  No preceding illness, head injury, new medication.  MRI of brain with and without contrast on 07/28/2023, personally reviewed, was normal.  Added Ajovy  in addition to Botox .  ***   Rescue protocol: Nurtec Current NSAIDS/analgesics:  none Current triptans:  none Current ergotamine:  none Current anti-emetic:  Zofran  ODT 4mg  Current muscle relaxants:  none Current Antihypertensive medications:  atenolol , lisinopril , Imdur  Current Antidepressant medications:  bupropion  XL 300mg  Current Anticonvulsant medications:  topiramate  150mg  daily Current anti-CGRP:  Nurtec Current Vitamins/Herbal/Supplements:  D Current  Antihistamines/Decongestants:  Benadryl , Xyzal  Other therapy:  none Hormone/birth control:  none Other medications:  lurasidone , quetiapine  300mg  QHS, Eliquis      Caffeine :  1 Venti size cup of coffee daily.  No soda Diet:  four 8 oz glasses of water daily.  No soda.  Skips meals (lunch) Exercise:  no Depression:  stable; Anxiety:  stable Other pain:  no Sleep hygiene:  Sleeps well.  Takes Seroquel .  Daytime somnolence.  Tested negative for sleep apnea.  HISTORY: Onset:  about 1978 Location:  primarily unilateral (either side) temple/posterior and into neck Quality:  stabbing Intensity:  Severe Aura:  phantosmia (usually smells burnt toast or something else cooking) Prodrome:  absent Postdrome:  Associated symptoms:  nausea, vomiting, photophobia, phonophobia, osmophobia, blurred.  She denies associated unilateral numbness or weakness. Duration:  several hours to 2 days Frequency:  1 to 2 a week. (Has 15 headache days a month) Frequency of abortive medication: Takes Excedrin about 4-5 days a week Triggers:  weather changes, certain smells (perfumes) Relieving factors:  ice pack Activity:  aggravates (cannot function about 2-3 days a month) Normally wakes up with them   Prior imaging: 05/19/2023 CT HEAD WO:  1. Generalized cerebral atrophy with chronic white matter small vessel ischemic changes.  2.  No acute intracranial abnormality. 10/15/2021 CT HEAD WO:  No acute intracranial abnormality. 12/02/2020 MRI BRAIN W WO:  This is a normal age-appropriate MRI of the brain with and without contrast. 04/22/2020 MRI BRAIN WO:  Normal MRI brain (without). No acute findings.   Past NSAIDS/analgesics:  Fioricet , tramadol , ASA (allergy) Past abortive triptans:  rizatriptan ,  sumatriptan (adverse reaction), frovatriptan Past abortive ergotamine:  none Past muscle relaxants:  tizanidine  Past anti-emetic:  none Past antihypertensive medications:  propranolol, metoprolol , diltiazem  Past  antidepressant medications:  amitriptyline Past anticonvulsant medications:  Depakote Past anti-CGRP:  Emgality , Ubrelvy  100mg , Zavzpret  NS (financial constraint) Past vitamins/Herbal/Supplements:  none Past antihistamines/decongestants:  none Other past therapies:  Botox  (only had one round because neurologist left)  Family history of headache:  daughter (migraines)  PAST MEDICAL HISTORY: Past Medical History:  Diagnosis Date   Adhesive capsulitis of left shoulder 12/16/2016   Asthma without status asthmaticus 02/06/2021   At risk for falls 02/04/2019   Atrial fibrillation (HCC) 09/01/2014   Last Assessment & Plan:  Formatting of this note might be different from the original. A:  Chronic.  Sinus rhythm at this time.  States she was told this during admission at [facility] in the past. P:  On baby aspirin  at home will continue.  Low dose metoprolol  started.   Bipolar 1 disorder, depressed, moderate (HCC) 02/18/2020   Capsulitis of left shoulder 10/20/2021   Chronic anticoagulation 02/20/2020   Chronic atrial fibrillation (HCC) 02/18/2020   Chronic headache 10/11/2013   Chronic interstitial cystitis 12/28/2005   Chronic migraine without aura, intractable, without status migrainosus 06/24/2017   Colon cancer screening 04/15/2016   Last Assessment & Plan:  Formatting of this note might be different from the original. Will schedule for colonoscopy   Depression, recurrent (HCC) 02/18/2020   Diabetes mellitus (HCC)    Dyslipidemia 02/20/2020   GAD (generalized anxiety disorder) 02/18/2020   GERD (gastroesophageal reflux disease) 11/05/2021   H/O chest pain 07/31/2021   History of atrial fibrillation 06/24/2017   History of posttraumatic stress disorder (PTSD) 10/07/2016   Hyperosmolarity due to secondary diabetes mellitus (HCC) 02/06/2021   Impaired mobility and activities of daily living 11/14/2015   Irritable bowel syndrome (IBS)    Junctional tachycardia (HCC) 11/05/2021   Kidney  stone 02/06/2021   Migraine without aura, not refractory 02/06/2021   Mixed hyperlipidemia 04/06/2021   Last Assessment & Plan:  Formatting of this note might be different from the original. 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 Controlled   Refractory migraine with aura 02/06/2021   Refractory migraine without aura 02/06/2021   Renal colic on left side 04/22/2017   Stage 3 chronic kidney disease (HCC) 02/20/2020   Thyroid  goiter    10/17/23 CT: right thyorid goiter with substernal extension, benign FNA 07/07/23   Unspecified adverse effect of other drug, medicinal and biological substance(995.29) 02/17/2006   Urinary tract obstruction 02/06/2021    MEDICATIONS: Current Outpatient Medications on File Prior to Visit  Medication Sig Dispense Refill   acetaminophen  (TYLENOL ) 500 MG tablet Take 1,000 mg by mouth every 6 (six) hours as needed for mild pain.     atorvastatin  (LIPITOR ) 80 MG tablet TAKE 1 TABLET DAILY 90 tablet 3   blood glucose meter kit and supplies One Touch Ultra  Check 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 One Touch Ultra  Check blood sugars once daily Dx: E11.9) 1 each 0   budesonide -formoterol  (SYMBICORT ) 80-4.5 MCG/ACT inhaler Inhale 2 puffs  into the lungs 2 (two) times daily. 20.4 g 3   buPROPion  (WELLBUTRIN  XL) 300 MG 24 hr tablet Take 1 tablet (300 mg total) by mouth daily. 90 tablet 1   Calcium  Carbonate (CALCIUM  500 PO) Take 500 mg by mouth daily.     diltiazem  (CARDIZEM  CD) 120 MG 24 hr capsule Take 120 mg by mouth daily.     ELIQUIS  5 MG TABS tablet TAKE 1 TABLET TWICE A  DAY 180 tablet 3   EPINEPHrine  0.3 mg/0.3 mL IJ SOAJ injection Inject 0.3 mg into the muscle as needed for anaphylaxis. 2 each 1   escitalopram  (LEXAPRO ) 20 MG tablet Take 1 tablet (20 mg total) by mouth daily. 30 tablet 2   ezetimibe  (ZETIA ) 10 MG tablet TAKE 1 TABLET DAILY 90 tablet 3   FARXIGA  10 MG TABS tablet TAKE 1 TABLET DAILY 90 tablet 3   fenofibrate  160 MG tablet Take 1 tablet (160 mg total) by mouth daily. 90 tablet 1   fluticasone  (FLONASE ) 50 MCG/ACT nasal spray Place 2 sprays into both nostrils daily. 16 g 6   Fremanezumab -vfrm (AJOVY ) 225 MG/1.5ML SOAJ Inject 225 mg into the skin every 30 (thirty) days. 1.68 mL 5   levocetirizine (XYZAL ) 5 MG tablet TAKE 1 TABLET EVERY EVENING 90 tablet 3   LORazepam  (ATIVAN ) 0.5 MG tablet Take 1 tablet (0.5 mg total) by mouth every 8 (eight) hours. 30 tablet 1   Lurasidone  HCl 120 MG TABS Take 1 tablet (120 mg total) by mouth at bedtime. 90 tablet 3   magnesium  oxide (MAG-OX) 400 (240 Mg) MG tablet Take 400 mg by mouth daily.     metoprolol  succinate (TOPROL -XL) 50 MG 24 hr tablet Take 0.5 tablets (25 mg total) by mouth daily. Take with or immediately following a meal.     metoprolol  tartrate (LOPRESSOR ) 25 MG tablet Take 1 tablet (25 mg total) by mouth every 4 (four) hours as needed (PALPITATIONS). 90 tablet 3   midodrine  (PROAMATINE ) 5 MG tablet Take 1 tablet (5 mg total) by mouth 3 (three) times daily with meals. 270 tablet 3   Multiple Vitamins-Minerals (MULTIVITAMIN WITH MINERALS) tablet Take 1 tablet by mouth daily. Daily Diabetes Health Pack - MVI + chromium, fish oil + vit D, magnesium , vit C, alpha lipoic acid w/ green tea     nitroGLYCERIN  (NITROSTAT ) 0.4 MG SL tablet Place 1 tablet (0.4 mg total) under the tongue every 5 (five) minutes as needed for chest pain. 25 tablet 11   ondansetron  (ZOFRAN ) 4 MG tablet Take 1 tablet (4 mg total) by mouth every 6 (six) hours. 12 tablet 0   ondansetron  (ZOFRAN -ODT) 4 MG disintegrating tablet PLACE ONE  TABLET ON THE TONGUE AND ALLOW TO DISSOLVE EVERY 8 HOURS AS NEEDED FOR NAUSEA OR VOMITING (Patient taking differently: Take 4 mg by mouth every 8 (eight) hours as needed for nausea or vomiting.) 30 tablet 0   ONETOUCH VERIO test strip USE DAILY TO CHECK BLOOD SUGAR (Patient taking differently: 1 each by Other route See admin instructions. Use daily to check blood sugar.) 100 strip 3   pantoprazole  (PROTONIX ) 40 MG tablet TAKE 1 TABLET DAILY 90 tablet 3   QUEtiapine  (SEROQUEL ) 300 MG tablet Take 1 tablet (300 mg total) by mouth at bedtime. 90 tablet 3   Rimegepant Sulfate (NURTEC) 75 MG TBDP Take 1 tablet (75 mg total) by mouth as needed (take 1 tab at the earlist onset of a migraine. Max 1 tab in 24 hours). 10 tablet  5   tirzepatide  (MOUNJARO ) 7.5 MG/0.5ML Pen Inject 7.5 mg into the skin once a week. 2 mL 1   topiramate  (TOPAMAX ) 50 MG tablet TAKE 3 TABLETS TWICE A DAY 180 tablet 11   No current facility-administered medications on file prior to visit.    ALLERGIES: Allergies  Allergen Reactions   Bee Venom Anaphylaxis   Onion Other (See Comments)    Respiratory Distress   Tetanus-Diphtheria Toxoids Td Anaphylaxis   Sumatriptan     Passed out, nose bleed    Zolpidem  Other (See Comments)    Causes sleep walking   Lockie Rima ] Nausea And Vomiting   Iodine Rash   Nsaids Nausea Only    Rash, hives and trouble breathing   Sulfa Antibiotics Rash   Vancomycin Rash    Other Reaction(s): STEVENS-JOHNSON    FAMILY HISTORY: Family History  Adopted: Yes  Problem Relation Age of Onset   Migraines Daughter    Bipolar disorder Daughter    Migraines Daughter    Migraines Son    Bipolar disorder Son       Objective:  *** General: No acute distress.  Patient appears well-groomed.   ***   Janne Members, DO  CC: Jobe Mulder, DO

## 2024-01-17 ENCOUNTER — Ambulatory Visit: Payer: Medicare (Managed Care) | Admitting: Neurology

## 2024-01-17 ENCOUNTER — Ambulatory Visit (INDEPENDENT_AMBULATORY_CARE_PROVIDER_SITE_OTHER): Payer: Medicare (Managed Care)

## 2024-01-17 ENCOUNTER — Encounter: Payer: Self-pay | Admitting: Family Medicine

## 2024-01-17 DIAGNOSIS — E538 Deficiency of other specified B group vitamins: Secondary | ICD-10-CM | POA: Diagnosis not present

## 2024-01-17 MED ORDER — CYANOCOBALAMIN 1000 MCG/ML IJ SOLN
1000.0000 ug | Freq: Once | INTRAMUSCULAR | Status: AC
  Administered 2024-01-17: 1000 ug via INTRAMUSCULAR

## 2024-01-17 NOTE — Progress Notes (Signed)
 Pt here for monthly B12 injection per original order dated: Dr.Wendling  Last B12 level:  01/11/24  B12 1000mcg given IM, and pt tolerated injection well.

## 2024-01-18 ENCOUNTER — Encounter: Payer: Self-pay | Admitting: Family Medicine

## 2024-01-18 ENCOUNTER — Ambulatory Visit: Payer: Medicare (Managed Care) | Admitting: Family Medicine

## 2024-01-18 ENCOUNTER — Ambulatory Visit: Payer: Medicare (Managed Care) | Admitting: Pharmacist

## 2024-01-18 ENCOUNTER — Other Ambulatory Visit: Payer: Self-pay | Admitting: Family Medicine

## 2024-01-18 VITALS — BP 118/72 | HR 88 | Temp 98.0°F | Resp 16 | Ht 66.0 in | Wt 161.0 lb

## 2024-01-18 DIAGNOSIS — W19XXXA Unspecified fall, initial encounter: Secondary | ICD-10-CM | POA: Diagnosis not present

## 2024-01-18 DIAGNOSIS — E1165 Type 2 diabetes mellitus with hyperglycemia: Secondary | ICD-10-CM

## 2024-01-18 DIAGNOSIS — I959 Hypotension, unspecified: Secondary | ICD-10-CM

## 2024-01-18 DIAGNOSIS — R55 Syncope and collapse: Secondary | ICD-10-CM

## 2024-01-18 DIAGNOSIS — I471 Supraventricular tachycardia, unspecified: Secondary | ICD-10-CM

## 2024-01-18 DIAGNOSIS — I480 Paroxysmal atrial fibrillation: Secondary | ICD-10-CM

## 2024-01-18 MED ORDER — APIXABAN 5 MG PO TABS: 5.0000 mg | ORAL_TABLET | Freq: Two times a day (BID) | ORAL | 1 refills | Status: DC

## 2024-01-18 MED ORDER — DEXCOM G7 SENSOR MISC
5 refills | Status: DC
Start: 2024-01-18 — End: 2024-01-19

## 2024-01-18 NOTE — Progress Notes (Signed)
 01/18/24 Name: Paula Stout MRN: [id number] DOB: 04-29-1956  No chief complaint on file.   Paula Stout is a 68 y.o. year old female.   They were referred to the pharmacist by their PCP for assistance in managing medication access and complex medication management.   Subjective: Patient has  been approved for Extra Help from Medicare. Medications are now $0, $4 or $11.20.   Patient reports affordability concerns with their medications: No  - improved since she was approved for LIS / Medicare Extra Help Patient reports access/transportation concerns to their pharmacy: No  Patient reports adherence concerns with their medications:  No  -     Type 2 DM:  Current therapy: Mounjaro  12.5mg  weekly but plan is to lower dose to 5mg  weekly since A1c and weight have improved so much. Farxiga  10mg  daily (started 02/25/2023)  Previous medications tried: Ozempic  - stopped due to nausea. Trulicity  - stopped when Mounjaro  started  Home blood glucose: Has started using Smart Log app on phone to record blood glucose Blood glucose has averaged in th 80 to 90 range.   Mounjaro :  Starting weight = 214 lbs BMI = 34.62; A1c = 6.5% Last weight was 161 lbs (has lost 53 lbs)   Wt Readings from Last 3 Encounters:  01/11/24 161 lb (73 kg)  01/09/24 150 lb (68 kg)  12/13/23 168 lb (76.2 kg)    CKD -  Recent serum creatinine = 2.00 - stable Patient is seeing Dr Ansel Kingdom with [facility].  Next appointment this month - May 2025.   Lisinopril  was stopped 03/23/2023 by Dr Ansel Kingdom.   Hypertension / Afib:  Current therapy: diltiazem  120mg  daily and metoprolol  succinate 50mg  - takes 0.5 tablet = 25mg  once a day  Also keeps metoprolol  tartrate 25mg  on hand to use as needed for palpitations. .   SVT Ablation completed 05/04/2023  Anticoagulation therapy: Eliquis  5mg  twice a day (Age = 68 yo; Scr = 1.90; weight = 73 kg)  BP Readings from Last 3 Encounters:  01/11/24 122/70   01/09/24 136/76  12/13/23 (!) 78/40    Last refill for atorvastatin  was for 90 days on 01/03/2024  Objective:  Lab Results  Component Value Date   HGBA1C 4.7 11/28/2023    Lab Results  Component Value Date   CREATININE 1.90 (H) 01/11/2024   BUN 17 01/11/2024   NA 140 01/11/2024   K 3.6 01/11/2024   CL 109 01/11/2024   CO2 21 01/11/2024    Lab Results  Component Value Date   CHOL 121 11/28/2023   HDL 31.90 (L) 11/28/2023   LDLCALC 56 11/28/2023   TRIG 164.0 (H) 11/28/2023   CHOLHDL 4 11/28/2023    Medications Reviewed Today     Reviewed by Cecilie Coffee, RPH-CPP (Pharmacist) on 01/18/24 at 1025  Med List Status: <None>   Medication Order Taking? Sig Documenting Provider Last Dose Status Informant  acetaminophen  (TYLENOL ) 500 MG tablet [id number] Yes Take 1,000 mg by mouth every 6 (six) hours as needed for mild pain. [provider] Taking Active Self  atorvastatin  (LIPITOR ) 80 MG tablet [id number] Yes TAKE 1 TABLET DAILY Jobe Mulder, DO Taking Active   blood glucose meter kit and supplies [id number]  One Touch Ultra  Check blood sugars once daily Dx: E11.9  Patient taking differently: 1 each by Other route See admin instructions. One Touch Ultra  Check blood sugars once daily Dx: E11.9   Jobe Mulder, DO  Active Self  budesonide -formoterol  (SYMBICORT ) 80-4.5  MCG/ACT inhaler [id number] Yes Inhale 2 puffs into the lungs 2 (two) times daily. Roetta Clarke, NP Taking Active   buPROPion  (WELLBUTRIN  XL) 300 MG 24 hr tablet [id number] Yes Take 1 tablet (300 mg total) by mouth daily. Lincoln Renshaw, NP Taking Active   Calcium  Carbonate (CALCIUM  500 PO) [id number] No Take 500 mg by mouth daily.  Patient not taking: Reported on 01/18/2024   [provider] Not Taking Active   diltiazem  (CARDIZEM  CD) 120 MG 24 hr capsule [id number] Yes Take 120 mg by mouth daily. [provider] Taking Active   ELIQUIS  5 MG TABS tablet [id number] Yes  TAKE 1 TABLET TWICE A DAY Wendling, Shellie Dials, DO Taking Active Self  EPINEPHrine  0.3 mg/0.3 mL IJ SOAJ injection [id number]  Inject 0.3 mg into the muscle as needed for anaphylaxis. Jobe Mulder, DO  Active   escitalopram  (LEXAPRO ) 20 MG tablet [id number] Yes Take 1 tablet (20 mg total) by mouth daily. Lincoln Renshaw, NP Taking Active   ezetimibe  (ZETIA ) 10 MG tablet [id number] Yes TAKE 1 TABLET DAILY Jobe Mulder, DO Taking Active   FARXIGA  10 MG TABS tablet [id number] Yes TAKE 1 TABLET DAILY Jobe Mulder, DO Taking Active   fenofibrate  160 MG tablet [id number] Yes Take 1 tablet (160 mg total) by mouth daily. Jobe Mulder, DO Taking Active   fluticasone  (FLONASE ) 50 MCG/ACT nasal spray [id number] Yes Place 2 sprays into both nostrils daily. Yevette Hem, FNP Taking Active Self  Fremanezumab -vfrm (AJOVY ) 225 MG/1.5ML SOAJ [id number] Yes Inject 225 mg into the skin every 30 (thirty) days. Merriam Abbey, DO Taking Active   levocetirizine (XYZAL ) 5 MG tablet [id number] Yes TAKE 1 TABLET EVERY EVENING Wendling, Shellie Dials, DO Taking Active   LORazepam  (ATIVAN ) 0.5 MG tablet [id number] Yes Take 1 tablet (0.5 mg total) by mouth every 8 (eight) hours. Lincoln Renshaw, NP Taking Active   Lurasidone  HCl 120 MG TABS [id number] Yes Take 1 tablet (120 mg total) by mouth at bedtime. Lincoln Renshaw, NP Taking Active   magnesium  oxide (MAG-OX) 400 (240 Mg) MG tablet [id number] Yes Take 400 mg by mouth daily. [provider] Taking Active Self  metoprolol  succinate (TOPROL -XL) 50 MG 24 hr tablet [id number] Yes Take 0.5 tablets (25 mg total) by mouth daily. Take with or immediately following a meal. Jobe Mulder, DO Taking Active   metoprolol  succinate (TOPROL -XL) 50 MG 24 hr tablet [id number] Yes Take 25 mg by mouth daily. Take with or immediately following a meal. [provider] Taking Active   metoprolol  tartrate (LOPRESSOR ) 25 MG tablet  [id number] Yes Take 1 tablet (25 mg total) by mouth every 4 (four) hours as needed (PALPITATIONS). Lei Pump, MD Taking Active Self           Med Note Alida Ion, [place] B   Wed Jan 18, 2024 10:19 AM) Hasn't need to use recenlty  midodrine  (PROAMATINE ) 5 MG tablet [id number] Yes Take 1 tablet (5 mg total) by mouth 3 (three) times daily with meals. Krasowski, Robert J, MD Taking Active   Multiple Vitamins-Minerals (MULTIVITAMIN WITH MINERALS) tablet [id number] Yes Take 1 tablet by mouth daily. Daily Diabetes Health Pack - MVI + chromium, fish oil + vit D, magnesium , vit C, alpha lipoic acid w/ green tea [provider] Taking Active Self  nitroGLYCERIN  (NITROSTAT ) 0.4 MG SL tablet [id number] No Place 1 tablet (0.4 mg total) under the tongue every 5 (five) minutes as needed for chest  pain.  Patient not taking: Reported on 01/18/2024   Krasowski, Robert J, MD Not Taking Active Self  ondansetron  (ZOFRAN ) 4 MG tablet [id number] Yes Take 1 tablet (4 mg total) by mouth every 6 (six) hours. Felicie Horning, PA-C Taking Active   [facility] VERIO test strip [id number] Yes USE DAILY TO CHECK BLOOD SUGAR  Patient taking differently: 1 each by Other route See admin instructions. Use daily to check blood sugar.   Jobe Mulder, DO Taking Active Self  pantoprazole  (PROTONIX ) 40 MG tablet [id number] Yes TAKE 1 TABLET DAILY Jobe Mulder, DO Taking Active   QUEtiapine  (SEROQUEL ) 300 MG tablet [id number] Yes Take 1 tablet (300 mg total) by mouth at bedtime. Lincoln Renshaw, NP Taking Active   Rimegepant Sulfate (NURTEC) 75 MG TBDP [id number] Yes Take 1 tablet (75 mg total) by mouth as needed (take 1 tab at the earlist onset of a migraine. Max 1 tab in 24 hours). Merriam Abbey, DO Taking Active Self  tirzepatide  (MOUNJARO ) 7.5 MG/0.5ML Pen [id number]  Inject 7.5 mg into the skin once a week. Jobe Mulder, DO  Active   topiramate  (TOPAMAX ) 50 MG tablet [id number] Yes TAKE 3 TABLETS  TWICE A DAY Wendling, Shellie Dials, DO Taking Active               Assessment/Plan:  Type 2 DM with CKD: blood glucose and weight have improved significantly starting Mounjaro  - Continue as planned to lower dose of Mounjaro  to 7.5mg  weekly. .  - Continue Farxiga  10mg  daily.   - Continue to check blood glucose once a day  Hypertension / Afib/ SVT:  Continue metoprolol  succinate 50mg  - take 25mg  or 0.5 tablet once a day and diltiazem  120mg  daily.  Continue Eliquis  5mg  twice a day - reviewed dose - no dose adjustment needed at this time.  Medication Management / Access:  Patient has qualified for Medicare Extra Help for 2025. Notes that medication are affordable.  Reviewed refill history and updated any needed Rx's  Meds ordered this encounter  Medications   apixaban  (ELIQUIS ) 5 MG TABS tablet    Sig: Take 1 tablet (5 mg total) by mouth 2 (two) times daily.    Dispense:  180 tablet    Refill:  1    Follow Up Plan: 4 to 6 months  Cecilie Coffee, PharmD Clinical Pharmacist [facility]

## 2024-01-18 NOTE — Telephone Encounter (Signed)
 Called pt appt scheduled today at 1130.

## 2024-01-18 NOTE — Patient Instructions (Signed)
 Ice/cold pack over area for 10-15 min twice daily.  OK to take Tylenol  1000 mg (2 extra strength tabs) or 975 mg (3 regular strength tabs) every 6 hours as needed.  Heat (pad or rice pillow in microwave) over affected area, 10-15 minutes twice daily.   Consider having some juice or hard candy near your bed. Also consider checking your sugar when you are shaky int he middle of the night.  Take 2 tabs of your midodrine  before bedtime to see if this helps.   Stay hydrated.   Let us  know if you need anything.

## 2024-01-18 NOTE — Progress Notes (Signed)
 Chief Complaint  Patient presents with   Fall    Discuss Fall    Subjective: Patient is a 68 y.o. female here for a fall.  The patient has been dealing with possible hypoglycemia and hypotension.  She is on midodrine  5 mg 3 times daily from her cardiology team.  She recently weaned down her Mounjaro  dosage from 12.5 mg weekly to 7.5 mg weekly.  Her first 7.5 mg dose which was 4 days ago.  She still is not eating very much.  Sugars are running in the 80s and 90s.  She sometimes feels shaky and grape juice tends to help.  She does not check her sugars in the middle of the night where she does feel shaky and offkilter.  She was going to the restroom this morning and when she stood up, she felt dizzy and lost consciousness and fell over.  She hit her head, left elbow, and right knee.  She has some bruising over the right knee.  She has no pain unless she touches the areas.  She is on Eliquis  daily and reports compliance.  No difficulty swallowing, trouble with speech, vision just, new balance issues, weakness, numbness, or tingling.  Past Medical History:  Diagnosis Date   Adhesive capsulitis of left shoulder 12/16/2016   Asthma without status asthmaticus 02/06/2021   At risk for falls 02/04/2019   Atrial fibrillation (HCC) 09/01/2014   Last Assessment & Plan:  Formatting of this note might be different from the original. A:  Chronic.  Sinus rhythm at this time.  States she was told this during admission at [facility] in the past. P:  On baby aspirin  at home will continue.  Low dose metoprolol  started.   Bipolar 1 disorder, depressed, moderate (HCC) 02/18/2020   Capsulitis of left shoulder 10/20/2021   Chronic anticoagulation 02/20/2020   Chronic atrial fibrillation (HCC) 02/18/2020   Chronic headache 10/11/2013   Chronic interstitial cystitis 12/28/2005   Chronic migraine without aura, intractable, without status migrainosus 06/24/2017   Colon cancer screening 04/15/2016   Last Assessment &  Plan:  Formatting of this note might be different from the original. Will schedule for colonoscopy   Depression, recurrent (HCC) 02/18/2020   Diabetes mellitus (HCC)    Dyslipidemia 02/20/2020   GAD (generalized anxiety disorder) 02/18/2020   GERD (gastroesophageal reflux disease) 11/05/2021   H/O chest pain 07/31/2021   History of atrial fibrillation 06/24/2017   History of posttraumatic stress disorder (PTSD) 10/07/2016   Hyperosmolarity due to secondary diabetes mellitus (HCC) 02/06/2021   Impaired mobility and activities of daily living 11/14/2015   Irritable bowel syndrome (IBS)    Junctional tachycardia (HCC) 11/05/2021   Kidney stone 02/06/2021   Migraine without aura, not refractory 02/06/2021   Mixed hyperlipidemia 04/06/2021   Last Assessment & Plan:  Formatting of this note might be different from the original. 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 Controlled   Refractory migraine with aura 02/06/2021   Refractory migraine without aura 02/06/2021   Renal colic on left side 04/22/2017   Stage 3 chronic kidney disease (HCC) 02/20/2020   Thyroid  goiter    10/17/23 CT: right thyorid goiter with substernal extension, benign FNA  07/07/23   Unspecified adverse effect of other drug, medicinal and biological substance(995.29) 02/17/2006   Urinary tract obstruction 02/06/2021    Objective: BP 118/72 (BP Location: Left Arm, Patient Position: Sitting)   Pulse 88   Temp 98 F (36.7 C) (Oral)   Resp 16   Ht 5\' 6"  (1.676 m)   Wt 161 lb (73 kg)   SpO2 98%   BMI 25.99 kg/m  General: Awake, appears stated age Heart: RRR, no LE edema, no bruits Neuro: Gait  is cautious and slow, DTRs equal and symmetric throughout without clonus, no cerebellar signs, 5/5 strength throughout in the upper extremities, 4/5 strength with hip flexion bilaterally MSK: Ecchymosis noted over the medial proximal tibia on the right.  No edema.  TTP without deformity over the left olecranon, right medial tibia, and left frontal bone. Lungs: CTAB, no rales, wheezes or rhonchi. No accessory muscle use Psych: Age appropriate judgment and insight, normal affect and mood  Assessment and Plan: Syncope, unspecified syncope type  Hypotension, unspecified hypotension type  Fall, initial encounter  1/2.  We will continue to see how things go with her sugars as we decrease her Mounjaro .  Probably needs a few more weeks for this new dosage to settle in.  She will keep juice or hard candy near her bed.  Consider checking sugars at night.  Get up slowly.  Stay hydrated.  Take 2 tabs of midodrine  before bed. 3.  She does have some musculoskeletal injuries.  Unlikely that there is anything intracranial going on with her exam and the tempo following her fall.  Ice, heat, Tylenol .  Send messages if no better.  Agree to hold off on imaging for now. The patient voiced understanding and agreement to the plan.  Shellie Dials [place], DO 01/18/24  11:56 AM

## 2024-01-19 ENCOUNTER — Encounter: Payer: Self-pay | Admitting: Behavioral Health

## 2024-01-19 ENCOUNTER — Ambulatory Visit (INDEPENDENT_AMBULATORY_CARE_PROVIDER_SITE_OTHER): Payer: Medicare (Managed Care) | Admitting: Behavioral Health

## 2024-01-19 ENCOUNTER — Encounter: Payer: Self-pay | Admitting: Cardiology

## 2024-01-19 DIAGNOSIS — F411 Generalized anxiety disorder: Secondary | ICD-10-CM | POA: Diagnosis not present

## 2024-01-19 DIAGNOSIS — F319 Bipolar disorder, unspecified: Secondary | ICD-10-CM

## 2024-01-19 DIAGNOSIS — N183 Chronic kidney disease, stage 3 unspecified: Secondary | ICD-10-CM | POA: Diagnosis not present

## 2024-01-19 DIAGNOSIS — F331 Major depressive disorder, recurrent, moderate: Secondary | ICD-10-CM

## 2024-01-19 NOTE — Progress Notes (Signed)
  Behavioral Health Counselor/Therapist Progress Note  Patient ID: Paula Stout, MRN: [id number],    Date: 01/19/2024 Time Spent: 56 minutes, 2:06PM until 2:56 PM . The patient consented to the video teletherapy and was located in her home during this session. She is aware it is the responsibility of the patient to secure confidentiality on her end of the session. The provider was in a private home  for the duration of this session.      Treatment Type: Individual Therapy  Reported Symptoms: Anxiety, depression  Mental Status Exam: Appearance:  Casual     Behavior: Appropriate  Motor: Normal  Speech/Language:  Normal Rate  Affect: Appropriate  Mood: normal  Thought process: normal  Thought content:   WNL  Sensory/Perceptual disturbances:   WNL  Orientation: oriented to person, place, time/date, situation, day of week, month of year, and year  Attention: Good  Concentration: Good  Memory: WNL  Fund of knowledge:  Good  Insight:   Good  Judgment:  Good  Impulse Control: Good   Risk Assessment: Danger to Self:  No Self-injurious Behavior: No Danger to Others: No Duty to Warn:no Physical Aggression / Violence:No  Access to Firearms a concern: No  Gang Involvement:No   Subjective: The patient had an episode 2 nights ago where she had to go to the bathroom around 2 or 3:00 in the morning.  She said she blacked out and fell on her face first onto the tile bathroom for putting a big knot on her head and hitting her right knee very hard.  She did go to the doctor the next morning who said she might have had a mild concussion but thankfully it was not worse and the knee was just a deep bruise.  She recognizes those episodes are taking place more frequently.  She did get back to bed where she slept a little the rest of the night but did not sleep well.  When she told her daughter about it the next morning her daughter said that she heard a crash at 8:00 in the morning and then  heard the patient's bedroom door open after the bathroom door closed.  The patient told her that it was 2 or 3 in the morning and ask her if she heard a crash while she did not check it out all respond.  She said that was very disturbing to her daughter hurt it and did not act on it.  She has had a conversation with her other daughters in [place] .  She is going to visit them for a month in the middle of July but I encouraged her to continue conversations about moving out through more permanently as it feels a safety environment is not good for the patient.  She does not get much support from her daughter and son-in-law.  Her daughter is asking her to do more than she physically feels like doing.  She did make her self get to her kidney specialist today who feels that the patient is on too many heart medications and should talk to her cardiologist about a pacemaker.  She hopes that the kidney not to communicate that with her cardiologist.  She meets with a cardiologist in about 3 weeks but I encouraged her to send a message prior to that about the episode she has been having.  She did meet with her primary care physician who reduced the amount of Wegovy  she is taking feeling that might be contributing to the drop in blood pressure  which in turn may be triggering some of the episodes.  The patient has lost about 75 pounds since October and he feels that is enough weight loss.  He also feels a low dosage may help maintain her blood sugar level and is also putting her on a monitor so she can check that more often.  She does contract for safety having no thoughts of hurting herself or anyone else. Diagnosis: Generalized anxiety disorder, major depressive disorder, moderate, recurrent Plan: I will meet with the patient weekly via care agility.  Treatment plan: We will use cognitive behavioral therapy as well as person centered and supportive therapy in addition to elements of dialectical behavior therapy to  help reduce the patient's anxiety and depression by at least 50% with a target date of October 06, 2023.  Goals for improving depression may include having less sadness as indicated by patient report and scores on the PHQ-9, have improved mood and return to a healthier level of functioning, identify causes including environment for depressed mood and learn ways to cope with depression especially those connected to her medical issues.  Interventions include using cognitive behavioral therapy to explore and replace thoughts and behaviors.  We will look at how depression is experienced in day-to-day living and encouraged sharing of feelings.  We will encourage the use of coping skills for management of depressive symptoms.  Goals for reducing anxiety are to improve her ability to manage anxiety symptoms, better handle stress, identify causes for anxiety and explore ways to lower it, resolve the core conflicts contributing to anxiety as well as manage thoughts and worrisome thinking contributing to feelings of anxiety.  Interventions will include providing education about anxiety, facilitate problem solution skills to help her identify options for resolving stress, teach coping skills for managing anxiety as well as mindfulness and communication in terms of family to reduce her stress level, use cognitive behavior therapy to identify and change anxiety provoking thought and behavior patterns as well as teach distress tolerance and mindfulness skills for alleviating anxiety. Progress: 30% progress notes were reviewed with a new extended target date of April 04, 2024.  Cecile Coder, [facility]                                         Cecile Coder, [facility]               Cecile Coder, [facility]

## 2024-01-19 NOTE — Telephone Encounter (Signed)
 Pt says that her "kidney doctor" told her with her recent activity she probably needs a PPM.  Pt states she has "passed out/fallen 15 times since beginning of year".  Reports she has been trying to increase salt intake but unfortunately has no appetite.  States she has increased her fluid intake.  Confirms she is taking Midodrine . I asked pt why the doctor told her she needed a PPM.  She stated b/c of the syncopal, multiple, episodes.  Informed pt that the monitor she wore near the end of last year did not show reasoning to needing a PPM.  Aware she may need another monitor to determine what, if any, changes may have occurred that would warrant PPM implant.  I also informed I did not see anything about needing PPM in MDs notes from Feb/March or April.  She said that kidney MD told her she needed to stop Diltiazem , Metoprolol  and Midodrine .  Aware that I am not sure stopping Diltiazem  and/or Metoprolol  is the solution at this time.   Aware stopping Midodrine  would not be advised but will forward to Dr. Krasowski to advise on. She understands his office will let her know about that particular medication.   Forwarding to Dr. Lawana Pray for advisement on if PPM needed and/or monitor vs nothing needed at this time. Pt aware it will be next week before getting back with her on Dr. Adell Age recommendation.  She is agreeable to plan.

## 2024-01-19 NOTE — Telephone Encounter (Signed)
 Discussed  w/ Dr. Lawana Pray.  Will call pt next week and discuss ? ILR

## 2024-01-20 ENCOUNTER — Telehealth (INDEPENDENT_AMBULATORY_CARE_PROVIDER_SITE_OTHER): Payer: Medicare (Managed Care) | Admitting: Behavioral Health

## 2024-01-20 ENCOUNTER — Encounter: Payer: Self-pay | Admitting: Behavioral Health

## 2024-01-20 DIAGNOSIS — F411 Generalized anxiety disorder: Secondary | ICD-10-CM | POA: Diagnosis not present

## 2024-01-20 DIAGNOSIS — F3130 Bipolar disorder, current episode depressed, mild or moderate severity, unspecified: Secondary | ICD-10-CM

## 2024-01-20 NOTE — Progress Notes (Signed)
 Paula Stout [id number] 23-Aug-1956 68 y.o.  Virtual Visit via Video Note  I connected with pt @ on 01/20/24 at  1:30 PM EDT by a video enabled telemedicine application and verified that I am speaking with the correct person using two identifiers.   I discussed the limitations of evaluation and management by telemedicine and the availability of in person appointments. The patient expressed understanding and agreed to proceed.  I discussed the assessment and treatment plan with the patient. The patient was provided an opportunity to ask questions and all were answered. The patient agreed with the plan and demonstrated an understanding of the instructions.   The patient was advised to call back or seek an in-person evaluation if the symptoms worsen or if the condition fails to improve as anticipated.  I provided 30 minutes of non-face-to-face time during this encounter.  The patient was located at home.  The provider was located at [facility].   Paula Renshaw, NP   Subjective:   Patient ID:  Paula Stout is a 67 y.o. (DOB 12-21-1955) female.  Chief Complaint: No chief complaint on file.   HPI "Paula Stout", 68 year old female presents to this office via video visit for follow up and medication management.  Collateral information should be considered reliable. Reviewed her medication. She is satisfied with her psychiatric medication right now but very frustrated about her current health issues specifically cardiac problems. Reports it has been recommended that she get pace maker. She says she is getting conflicting information and trying to figure out what to do.  Continues with lack of energy and motivation.  She reports her depression today at 6/10, and anxiety at 6/10.  She is sleeping 7 to 8 hours per night with the aid of Seroquel .  She denies any current mania, no history of psychosis or auditory or visual hallucinations.  No SI or HI.  Patient states that she feels safe and  verbally contracts for safety today.   Past psychiatric medication trials: Zoloft Prozac Cymbalta Lithium Depakote Valium Ativan  Gabapentin Trazodone  Ambien    Review of Systems:  Review of Systems  Constitutional: Negative.   Allergic/Immunologic: Negative.   Neurological: Negative.   Psychiatric/Behavioral:  Positive for dysphoric mood. The patient is nervous/anxious.     Medications: I have reviewed the patient's current medications.  Current Outpatient Medications  Medication Sig Dispense Refill   acetaminophen  (TYLENOL ) 500 MG tablet Take 1,000 mg by mouth every 6 (six) hours as needed for mild pain.     apixaban  (ELIQUIS ) 5 MG TABS tablet Take 1 tablet (5 mg total) by mouth 2 (two) times daily. 180 tablet 1   atorvastatin  (LIPITOR ) 80 MG tablet TAKE 1 TABLET DAILY 90 tablet 3   blood glucose meter kit and supplies One Touch Ultra  Check 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 One Touch Ultra  Check blood sugars once daily Dx: E11.9) 1 each 0   budesonide -formoterol  (SYMBICORT ) 80-4.5 MCG/ACT inhaler Inhale 2 puffs into the lungs 2 (two) times daily. 20.4 g 3   buPROPion  (WELLBUTRIN  XL) 300 MG 24 hr tablet Take 1 tablet (300 mg total) by mouth daily. 90 tablet 1   Continuous Glucose Sensor (DEXCOM G7 SENSOR) MISC Check blood sugars as directed 3 each 3   diltiazem  (CARDIZEM  CD) 120 MG 24 hr capsule Take 120 mg by mouth daily.     EPINEPHrine  0.3 mg/0.3 mL IJ SOAJ injection Inject 0.3 mg into the muscle  as needed for anaphylaxis. 2 each 1   escitalopram  (LEXAPRO ) 20 MG tablet Take 1 tablet (20 mg total) by mouth daily. 30 tablet 2   ezetimibe  (ZETIA ) 10 MG tablet TAKE 1 TABLET DAILY 90 tablet 3   FARXIGA  10 MG TABS tablet TAKE 1 TABLET DAILY 90 tablet 3   fenofibrate  160 MG tablet Take 1 tablet (160 mg total) by mouth daily. 90 tablet 1   fluticasone  (FLONASE ) 50 MCG/ACT nasal spray Place 2 sprays into  both nostrils daily. 16 g 6   Fremanezumab -vfrm (AJOVY ) 225 MG/1.5ML SOAJ Inject 225 mg into the skin every 30 (thirty) days. 1.68 mL 5   levocetirizine (XYZAL ) 5 MG tablet TAKE 1 TABLET EVERY EVENING 90 tablet 3   LORazepam  (ATIVAN ) 0.5 MG tablet Take 1 tablet (0.5 mg total) by mouth every 8 (eight) hours. 30 tablet 1   Lurasidone  HCl 120 MG TABS Take 1 tablet (120 mg total) by mouth at bedtime. 90 tablet 3   magnesium  oxide (MAG-OX) 400 (240 Mg) MG tablet Take 400 mg by mouth daily.     metoprolol  succinate (TOPROL -XL) 50 MG 24 hr tablet Take 0.5 tablets (25 mg total) by mouth daily. Take with or immediately following a meal.     metoprolol  tartrate (LOPRESSOR ) 25 MG tablet Take 1 tablet (25 mg total) by mouth every 4 (four) hours as needed (PALPITATIONS). 90 tablet 3   midodrine  (PROAMATINE ) 5 MG tablet Take 1 tablet (5 mg total) by mouth 3 (three) times daily with meals. 270 tablet 3   Multiple Vitamins-Minerals (MULTIVITAMIN WITH MINERALS) tablet Take 1 tablet by mouth daily. Daily Diabetes Health Pack - MVI + chromium, fish oil + vit D, magnesium , vit C, alpha lipoic acid w/ green tea     nitroGLYCERIN  (NITROSTAT ) 0.4 MG SL tablet Place 1 tablet (0.4 mg total) under the tongue every 5 (five) minutes as needed for chest pain. (Patient not taking: Reported on 01/18/2024) 25 tablet 11   ondansetron  (ZOFRAN ) 4 MG tablet Take 1 tablet (4 mg total) by mouth every 6 (six) hours. 12 tablet 0   ONETOUCH VERIO test strip USE DAILY TO CHECK BLOOD SUGAR (Patient taking differently: 1 each by Other route See admin instructions. Use daily to check blood sugar.) 100 strip 3   pantoprazole  (PROTONIX ) 40 MG tablet TAKE 1 TABLET DAILY 90 tablet 3   QUEtiapine  (SEROQUEL ) 300 MG tablet Take 1 tablet (300 mg total) by mouth at bedtime. 90 tablet 3   Rimegepant Sulfate (NURTEC) 75 MG TBDP Take 1 tablet (75 mg total) by mouth as needed (take 1 tab at the earlist onset of a migraine. Max 1 tab in 24 hours). 10 tablet 5    tirzepatide  (MOUNJARO ) 7.5 MG/0.5ML Pen Inject 7.5 mg into the skin once a week. 2 mL 1   topiramate  (TOPAMAX ) 50 MG tablet TAKE 3 TABLETS TWICE A DAY 180 tablet 11   No current facility-administered medications for this visit.    Medication Side Effects: None  Allergies:  Allergies  Allergen Reactions   Bee Venom Anaphylaxis   Onion Other (See Comments)    Respiratory Distress   Tetanus-Diphtheria Toxoids Td Anaphylaxis   Sumatriptan     Passed out, nose bleed    Zolpidem  Other (See Comments)    Causes sleep walking   Asa [Aspirin ] Nausea And Vomiting   Iodine Rash   Nsaids Nausea Only    Rash, hives and trouble breathing   Sulfa Antibiotics Rash   Vancomycin Rash  Other Reaction(s): STEVENS-JOHNSON    Past Medical History:  Diagnosis Date   Adhesive capsulitis of left shoulder 12/16/2016   Asthma without status asthmaticus 02/06/2021   At risk for falls 02/04/2019   Atrial fibrillation (HCC) 09/01/2014   Last Assessment & Plan:  Formatting of this note might be different from the original. A:  Chronic.  Sinus rhythm at this time.  States she was told this during admission at [facility] in the past. P:  On baby aspirin  at home will continue.  Low dose metoprolol  started.   Bipolar 1 disorder, depressed, moderate (HCC) 02/18/2020   Capsulitis of left shoulder 10/20/2021   Chronic anticoagulation 02/20/2020   Chronic atrial fibrillation (HCC) 02/18/2020   Chronic headache 10/11/2013   Chronic interstitial cystitis 12/28/2005   Chronic migraine without aura, intractable, without status migrainosus 06/24/2017   Colon cancer screening 04/15/2016   Last Assessment & Plan:  Formatting of this note might be different from the original. Will schedule for colonoscopy   Depression, recurrent (HCC) 02/18/2020   Diabetes mellitus (HCC)    Dyslipidemia 02/20/2020   GAD (generalized anxiety disorder) 02/18/2020   GERD (gastroesophageal reflux disease) 11/05/2021   H/O chest  pain 07/31/2021   History of atrial fibrillation 06/24/2017   History of posttraumatic stress disorder (PTSD) 10/07/2016   Hyperosmolarity due to secondary diabetes mellitus (HCC) 02/06/2021   Impaired mobility and activities of daily living 11/14/2015   Irritable bowel syndrome (IBS)    Junctional tachycardia (HCC) 11/05/2021   Kidney stone 02/06/2021   Migraine without aura, not refractory 02/06/2021   Mixed hyperlipidemia 04/06/2021   Last Assessment & Plan:  Formatting of this note might be different from the original. 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 Controlled   Refractory migraine with aura 02/06/2021   Refractory migraine without aura 02/06/2021   Renal colic on left side 04/22/2017   Stage 3 chronic kidney disease (HCC) 02/20/2020   Thyroid  goiter    10/17/23 CT: right thyorid goiter with substernal extension, benign FNA 07/07/23   Unspecified adverse effect of other drug, medicinal and biological substance(995.29) 02/17/2006   Urinary tract obstruction 02/06/2021    Family History  Adopted: Yes  Problem Relation Age of Onset   Migraines Daughter    Bipolar disorder Daughter    Migraines Daughter    Migraines Son    Bipolar disorder Son     Social History   Socioeconomic History   Marital status: Widowed    Spouse name: Not on file   Number of children: 6   Years of education: Not on file   Highest education level: Associate degree: academic program  Occupational History   Occupation: not employed  Tobacco Use   Smoking status: Never    Passive exposure: Past   Smokeless tobacco: Never   Tobacco comments:    Never smoke 07/15/22   Vaping Use   Vaping status: Never Used  Substance and Sexual Activity   Alcohol use: Never    Comment: light social   Drug use: Never   Sexual activity: Not Currently  Other Topics Concern   Not on file  Social History Narrative   Lives in [place] with  daughter and son-in-law.  Sews clothing in free time as hobby.    Social Drivers of Corporate investment banker Strain: Low Risk  (10/11/2023)   Overall Financial Resource Strain (CARDIA)    Difficulty of Paying Living Expenses: Not very hard  Recent Concern: Financial Resource Strain - Medium Risk (07/24/2023)   Overall Financial Resource Strain (CARDIA)    Difficulty of Paying Living Expenses: Somewhat hard  Food Insecurity: No Food Insecurity (10/11/2023)   Hunger Vital Sign    Worried About Running Out of Food in the Last Year: Never true    Ran Out of Food in the Last Year: Never true  Transportation Needs: No Transportation Needs (10/11/2023)   PRAPARE - Administrator, Civil Service (Medical): No    Lack of Transportation (Non-Medical): No  Physical Activity: Inactive (10/11/2023)   Exercise Vital Sign    Days of Exercise per Week: 0 days    Minutes of Exercise per Session: 0 min  Stress: Stress Concern Present (10/11/2023)   [employer] of Occupational Health - Occupational Stress Questionnaire    Feeling of Stress : To some extent  Social Connections: Socially Isolated (10/11/2023)   Social Connection and Isolation Panel [NHANES]    Frequency of Communication with Friends and Family: Three times a week    Frequency of Social Gatherings with Friends and Family: Never    Attends Religious Services: Never    Database administrator or Organizations: No    Attends Banker Meetings: Never    Marital Status: Widowed  Intimate Partner Violence: Not At Risk (09/18/2023)   Received from [facility]   Humiliation, Afraid, Rape, and Kick questionnaire    Fear of Current or Ex-Partner: No     Emotionally Abused: No    Physically Abused: No    Sexually Abused: No    Past Medical History, Surgical history, Social history, and Family history were reviewed and updated as appropriate.   Please see review of systems for further details on the patient's review from today.   Objective:   Physical Exam:  There were no vitals taken for this visit.  Physical Exam Neurological:     Mental Status: She is alert and oriented to person, place, and time.  Psychiatric:        Attention and Perception: Attention and perception normal.        Mood and Affect: Mood normal.        Speech: Speech normal.        Behavior: Behavior normal. Behavior is cooperative.        Cognition and Memory: Cognition and memory normal.        Judgment: Judgment normal.     Comments: Insight intact     Lab Review:     Component Value Date/Time   NA 140 01/11/2024 1418   NA 141 04/20/2023 1541   K 3.6 01/11/2024 1418   CL 109 01/11/2024 1418   CO2 21 01/11/2024 1418   GLUCOSE 87 01/11/2024 1418   BUN 17 01/11/2024 1418   BUN 14 04/20/2023 1541   CREATININE 1.90 (H) 01/11/2024 1418   CALCIUM  9.5 01/11/2024 1418   CALCIUM  9.6 10/20/2023 0000   PROT 6.1 01/11/2024 1418   PROT 6.7 02/10/2021 0914   ALBUMIN 3.5 01/11/2024 1418   ALBUMIN 3.9 02/10/2021 0914   AST 25 01/11/2024 1418   ALT 13 01/11/2024 1418   ALKPHOS 49 01/11/2024 1418   BILITOT 0.6 01/11/2024  1418   BILITOT 0.3 02/10/2021 0914   GFRNONAA 30 (L) 01/09/2024 1710   GFRAA 39 (L) 04/22/2020 1726       Component Value Date/Time   WBC 5.5 01/11/2024 1418   RBC 3.54 (L) 01/11/2024 1418   HGB 11.1 (L) 01/11/2024 1418   HGB 12.4 04/20/2023 1537   HCT 34.3 (L) 01/11/2024 1418   HCT 38.3 04/20/2023 1537   PLT 292.0 01/11/2024 1418   PLT 324 04/20/2023 1537   MCV 96.8 01/11/2024 1418   MCV 92 04/20/2023 1537   MCH 31.6 01/09/2024 1710   MCHC 32.4 01/11/2024 1418   RDW 14.7 01/11/2024 1418   RDW 13.7 04/20/2023 1537   LYMPHSABS  1.7 11/28/2023 1117   LYMPHSABS 1.9 10/13/2022 1002   MONOABS 0.4 11/28/2023 1117   EOSABS 0.3 11/28/2023 1117   EOSABS 0.3 10/13/2022 1002   BASOSABS 0.1 11/28/2023 1117   BASOSABS 0.1 10/13/2022 1002    No results found for: "POCLITH", "LITHIUM"   No results found for: "PHENYTOIN", "PHENOBARB", "VALPROATE", "CBMZ"   .res Assessment: Plan:    Greater than 50% of 30  min video visit time with patient was spent on counseling and coordination of care. We discussed her recent health concerns as well as her medications. Says she went to ER  for Syncope. She is following up with cardiac specialist on concerns about recommended pace maker.  She also is on high dose metoprolol . Also has history of A-Fib.  Reinforced with  patient to f/u and  clear any new medication with cardiologist. I feel that she is medication maxed with current condition. She report nephrology believes she may eliminate several meds if receiving pace maker. S  We agreed today to:   Will continue Seroquel  to 200 mg at bedtime daily Will continue Latuda  120 mg daily To continue Wellbutrin  300 mg daily To increase Lexapro  to 20  mg daily after breakfast  Will report worsening symptoms promptly Provided emergency contact information Discussed potential metabolic side effects associated with atypical antipsychotics, as well as potential risk for movement side effects. Advised pt to contact office if movement side effects occur.   Reviewed PDMP       Diagnoses and all orders for this visit:  Bipolar I disorder, most recent episode depressed (HCC)  Generalized anxiety disorder     Please see After Visit Summary for patient specific instructions.  Future Appointments  Date Time Provider Department Center  01/23/2024  1:30 PM Merriam Abbey, DO [facility] None  01/26/2024  3:00 PM Cecile Coder, [facility] None  02/01/2024  2:00 PM Cecile Coder, [facility] None  02/08/2024  2:00 PM [facility] 2 [facility]  [facility]  02/09/2024  3:00 PM Cecile Coder, [facility] None  02/14/2024  4:00 PM Manfred Seed, MD [facility] None  02/15/2024  2:00 PM Cecile Coder, [facility] None  02/21/2024  1:30 PM Deneise Finlay, MD [facility] None  02/22/2024  2:00 PM Cecile Coder, [facility] None  02/29/2024  2:00 PM Cecile Coder, [facility] None  03/07/2024  2:00 PM Cecile Coder, [facility] None  05/30/2024 10:15 AM Gwenette Lennox, Shellie Dials, DO [facility]  06/20/2024 10:00 AM [facility] PHARMACIST [facility]    No orders of the defined types were placed in this encounter.     -------------------------------

## 2024-01-20 NOTE — Progress Notes (Signed)
 NEUROLOGY FOLLOW UP OFFICE NOTE  Paula Stout [id number]  Assessment/Plan:   Chronic migraine with and without aura, without status migrainosus, not intractable     Migraine prevention.  Due to concern for cost of Botox , I advised to contact her insurance to find out about copay for Vyepti.  If manageable, then will change to Vyepti.  Otherwise, will restart Botox  in addition to Ajovy .  Continue topiramate  150mg  daily Migraine rescue:  Nurtec, Zofran .  Will prescribe small dose of baclofen  5mg  to take as second line if Nurtec ineffective (limited supply of 10 tablets) Limit use of pain relievers to no more than 9 days out of the month to prevent risk of rebound or medication-overuse headache. Follow up 6 months.        Subjective:  Paula Stout is a 68 year old left-handed female with paroxysmal atrial fibrillation, PSVT s/p ablation (now with bradycardia and hypotension due to medications), DM II, depression, anxiety who follows up for migraines.  UPDATE: Last seen in November.  2 months previously, in September, she woke up with a full blown migraine which has continued to be persistent.  She was seen in the ED on September where she had a CT head that revealed no acute changes.  She was advised to take Nurtec once daily during that period of time.  The migraines are exacerbating her chronic chest pain as well.  She was seen in the ED on subsequent occasions in October and twice in November month, last 2 days ago.  She is not aware of any particularly trigger.  She has maybe been on the computer a little more but nothing significant.  No preceding illness, head injury, new medication.  MRI of brain with and without contrast on 07/28/2023, personally reviewed, was normal.  Added Ajovy  in addition to Botox .  However, she missed her Botox  appointment in December and hasn't had it since.  However, she was told by her insurance that Botox  will cost her $300 a dose.  Duration:  4-5  hours with Nurtec (Nurtec ineffective 25% of time) Frequency:  every other day, daily for last 2 weeks.  She has been experiencing syncope and falls.  Blood sugars have been stable but blood pressure has been low.  Her cardiologist started Midodrine  which has not helped.  Has not been appropriately hydrating and eating.  Only takes lorazepam  once every couple of weeks.  He has suggested a heart monitor.   Rescue protocol: Nurtec Current NSAIDS/analgesics:  none Current triptans:  none Current ergotamine:  none Current anti-emetic:  Zofran  ODT 4mg  Current muscle relaxants:  none Current Antihypertensive medications:  atenolol , lisinopril , Imdur  Current Antidepressant medications:  bupropion  XL 300mg  Current Anticonvulsant medications:  topiramate  150mg  daily Current anti-CGRP:  Nurtec Current Vitamins/Herbal/Supplements:  D Current Antihistamines/Decongestants:  Benadryl , Xyzal  Other therapy:  none Hormone/birth control:  none Other medications:  lurasidone , quetiapine  300mg  QHS, Eliquis      Caffeine :  1 Venti size cup of coffee daily.  No soda Diet:  four 8 oz glasses of water daily.  No soda.  Skips meals (lunch) Exercise:  no Depression:  stable; Anxiety:  stable Other pain:  no Sleep hygiene:  Sleeps well.  Takes Seroquel .  Daytime somnolence.  Tested negative for sleep apnea.  HISTORY: Onset:  about 1978 Location:  primarily unilateral (either side) temple/posterior and into neck Quality:  stabbing Intensity:  Severe Aura:  phantosmia (usually smells burnt toast or something else cooking) Prodrome:  absent Postdrome:  Associated symptoms:  nausea, vomiting, photophobia, phonophobia, osmophobia, blurred.  She denies associated unilateral numbness or weakness. Duration:  several hours to 2 days Frequency:  1 to 2 a week. (Has 15 headache days a month) Frequency of abortive medication: Takes Excedrin about 4-5 days a week Triggers:  weather changes, certain smells  (perfumes) Relieving factors:  ice pack Activity:  aggravates (cannot function about 2-3 days a month) Normally wakes up with them   Prior imaging: 05/19/2023 CT HEAD WO:  1. Generalized cerebral atrophy with chronic white matter small vessel ischemic changes.  2.  No acute intracranial abnormality. 10/15/2021 CT HEAD WO:  No acute intracranial abnormality. 12/02/2020 MRI BRAIN W WO:  This is a normal age-appropriate MRI of the brain with and without contrast. 04/22/2020 MRI BRAIN WO:  Normal MRI brain (without). No acute findings.   Past NSAIDS/analgesics:  Fioricet , tramadol , ASA/NSAIDs (allergy) Past abortive triptans:  rizatriptan , sumatriptan (adverse reaction), frovatriptan Past abortive ergotamine:  none Past muscle relaxants:  tizanidine  Past anti-emetic:  none Past antihypertensive medications:  propranolol, metoprolol , diltiazem  Past antidepressant medications:  amitriptyline Past anticonvulsant medications:  Depakote Past anti-CGRP:  Emgality , Ubrelvy  100mg , Zavzpret  NS (financial constraint) Past vitamins/Herbal/Supplements:  none Past antihistamines/decongestants:  none Other past therapies:  Botox  (only had one round because neurologist left)  Family history of headache:  daughter (migraines)  PAST MEDICAL HISTORY: Past Medical History:  Diagnosis Date   Adhesive capsulitis of left shoulder 12/16/2016   Asthma without status asthmaticus 02/06/2021   At risk for falls 02/04/2019   Atrial fibrillation (HCC) 09/01/2014   Last Assessment & Plan:  Formatting of this note might be different from the original. A:  Chronic.  Sinus rhythm at this time.  States she was told this during admission at [facility] in the past. P:  On baby aspirin  at home will continue.  Low dose metoprolol  started.   Bipolar 1 disorder, depressed, moderate (HCC) 02/18/2020   Capsulitis of left shoulder 10/20/2021   Chronic anticoagulation 02/20/2020   Chronic atrial fibrillation (HCC) 02/18/2020    Chronic headache 10/11/2013   Chronic interstitial cystitis 12/28/2005   Chronic migraine without aura, intractable, without status migrainosus 06/24/2017   Colon cancer screening 04/15/2016   Last Assessment & Plan:  Formatting of this note might be different from the original. Will schedule for colonoscopy   Depression, recurrent (HCC) 02/18/2020   Diabetes mellitus (HCC)    Dyslipidemia 02/20/2020   GAD (generalized anxiety disorder) 02/18/2020   GERD (gastroesophageal reflux disease) 11/05/2021   H/O chest pain 07/31/2021   History of atrial fibrillation 06/24/2017   History of posttraumatic stress disorder (PTSD) 10/07/2016   Hyperosmolarity due to secondary diabetes mellitus (HCC) 02/06/2021   Impaired mobility and activities of daily living 11/14/2015   Irritable bowel syndrome (IBS)    Junctional tachycardia (HCC) 11/05/2021   Kidney stone 02/06/2021   Migraine without aura, not refractory 02/06/2021   Mixed hyperlipidemia 04/06/2021   Last Assessment & Plan:  Formatting of this note might be different from the original. 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 Controlled   Refractory migraine with aura 02/06/2021   Refractory migraine without aura 02/06/2021   Renal colic on left side 04/22/2017  Stage 3 chronic kidney disease (HCC) 02/20/2020   Thyroid  goiter    10/17/23 CT: right thyorid goiter with substernal extension, benign FNA 07/07/23   Unspecified adverse effect of other drug, medicinal and biological substance(995.29) 02/17/2006   Urinary tract obstruction 02/06/2021    MEDICATIONS: Current Outpatient Medications on File Prior to Visit   Medication Sig Dispense Refill   acetaminophen  (TYLENOL ) 500 MG tablet Take 1,000 mg by mouth every 6 (six) hours as needed for mild pain.     apixaban  (ELIQUIS ) 5 MG TABS tablet Take 1 tablet (5 mg total) by mouth 2 (two) times daily. 180 tablet 1   atorvastatin  (LIPITOR ) 80 MG tablet TAKE 1 TABLET DAILY 90 tablet 3   blood glucose meter kit and supplies One Touch Ultra  Check 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 One Touch Ultra  Check blood sugars once daily Dx: E11.9) 1 each 0   budesonide -formoterol  (SYMBICORT ) 80-4.5 MCG/ACT inhaler Inhale 2 puffs into the lungs 2 (two) times daily. 20.4 g 3   buPROPion  (WELLBUTRIN  XL) 300 MG 24 hr tablet Take 1 tablet (300 mg total) by mouth daily. 90 tablet 1   Continuous Glucose Sensor (DEXCOM G7 SENSOR) MISC Check blood sugars as directed 3 each 3   diltiazem  (CARDIZEM  CD) 120 MG 24 hr capsule Take 120 mg by mouth daily.     EPINEPHrine  0.3 mg/0.3 mL IJ SOAJ injection Inject 0.3 mg into the muscle as needed for anaphylaxis. 2 each 1   escitalopram  (LEXAPRO ) 20 MG tablet Take 1 tablet (20 mg total) by mouth daily. 30 tablet 2   ezetimibe  (ZETIA ) 10 MG tablet TAKE 1 TABLET DAILY 90 tablet 3   FARXIGA  10 MG TABS tablet TAKE 1 TABLET DAILY 90 tablet 3   fenofibrate  160 MG tablet Take 1 tablet (160 mg total) by mouth daily. 90 tablet 1   fluticasone  (FLONASE ) 50 MCG/ACT nasal spray Place 2 sprays into both nostrils daily. 16 g 6   Fremanezumab -vfrm (AJOVY ) 225 MG/1.5ML SOAJ Inject 225 mg into the skin every 30 (thirty) days. 1.68 mL 5   levocetirizine (XYZAL ) 5 MG tablet TAKE 1 TABLET EVERY EVENING 90 tablet 3   LORazepam  (ATIVAN ) 0.5 MG tablet Take 1 tablet (0.5 mg total) by mouth every 8 (eight) hours. 30 tablet 1   Lurasidone  HCl 120 MG TABS Take 1 tablet (120 mg total) by mouth at bedtime. 90 tablet 3   magnesium  oxide (MAG-OX) 400 (240 Mg) MG tablet Take 400 mg by mouth daily.      metoprolol  succinate (TOPROL -XL) 50 MG 24 hr tablet Take 0.5 tablets (25 mg total) by mouth daily. Take with or immediately following a meal.     metoprolol  tartrate (LOPRESSOR ) 25 MG tablet Take 1 tablet (25 mg total) by mouth every 4 (four) hours as needed (PALPITATIONS). 90 tablet 3   midodrine  (PROAMATINE ) 5 MG tablet Take 1 tablet (5 mg total) by mouth 3 (three) times daily with meals. 270 tablet 3   Multiple Vitamins-Minerals (MULTIVITAMIN WITH MINERALS) tablet Take 1 tablet by mouth daily. Daily Diabetes Health Pack - MVI + chromium, fish oil + vit D, magnesium , vit C, alpha lipoic acid w/ green tea     nitroGLYCERIN  (NITROSTAT ) 0.4 MG SL tablet Place 1 tablet (0.4 mg total) under the tongue every 5 (five) minutes as needed for chest pain. (Patient not taking: Reported on 01/18/2024) 25 tablet 11   ondansetron  (ZOFRAN ) 4  MG tablet Take 1 tablet (4 mg total) by mouth every 6 (six) hours. 12 tablet 0   ONETOUCH VERIO test strip USE DAILY TO CHECK BLOOD SUGAR (Patient taking differently: 1 each by Other route See admin instructions. Use daily to check blood sugar.) 100 strip 3   pantoprazole  (PROTONIX ) 40 MG tablet TAKE 1 TABLET DAILY 90 tablet 3   QUEtiapine  (SEROQUEL ) 300 MG tablet Take 1 tablet (300 mg total) by mouth at bedtime. 90 tablet 3   Rimegepant Sulfate (NURTEC) 75 MG TBDP Take 1 tablet (75 mg total) by mouth as needed (take 1 tab at the earlist onset of a migraine. Max 1 tab in 24 hours). 10 tablet 5   tirzepatide  (MOUNJARO ) 7.5 MG/0.5ML Pen Inject 7.5 mg into the skin once a week. 2 mL 1   topiramate  (TOPAMAX ) 50 MG tablet TAKE 3 TABLETS TWICE A DAY 180 tablet 11   No current facility-administered medications on file prior to visit.    ALLERGIES: Allergies  Allergen Reactions   Bee Venom Anaphylaxis   Onion Other (See Comments)    Respiratory Distress   Tetanus-Diphtheria Toxoids Td Anaphylaxis   Sumatriptan     Passed out, nose bleed    Zolpidem  Other (See Comments)     Causes sleep walking   Asa [Aspirin ] Nausea And Vomiting   Iodine Rash   Nsaids Nausea Only    Rash, hives and trouble breathing   Sulfa Antibiotics Rash   Vancomycin Rash    Other Reaction(s): STEVENS-JOHNSON    FAMILY HISTORY: Family History  Adopted: Yes  Problem Relation Age of Onset   Migraines Daughter    Bipolar disorder Daughter    Migraines Daughter    Migraines Son    Bipolar disorder Son       Objective:  Blood pressure (!) 87/55, pulse 70, height 5\' 6"  (1.676 m), weight 159 lb (72.1 kg), SpO2 99%. General: No acute distress.  Patient appears well-groomed.     Janne Members, DO  CC: Jobe Mulder, DO

## 2024-01-23 ENCOUNTER — Encounter: Payer: Self-pay | Admitting: Family Medicine

## 2024-01-23 ENCOUNTER — Ambulatory Visit: Payer: Medicare (Managed Care) | Admitting: Neurology

## 2024-01-23 ENCOUNTER — Telehealth: Payer: Self-pay | Admitting: Neurology

## 2024-01-23 ENCOUNTER — Encounter: Payer: Self-pay | Admitting: Neurology

## 2024-01-23 ENCOUNTER — Encounter: Payer: Self-pay | Admitting: Behavioral Health

## 2024-01-23 VITALS — BP 87/55 | HR 70 | Ht 66.0 in | Wt 159.0 lb

## 2024-01-23 DIAGNOSIS — G43709 Chronic migraine without aura, not intractable, without status migrainosus: Secondary | ICD-10-CM

## 2024-01-23 MED ORDER — BACLOFEN 5 MG PO TABS: 1.0000 | ORAL_TABLET | Freq: Two times a day (BID) | ORAL | 5 refills | Status: DC | PRN

## 2024-01-23 NOTE — Progress Notes (Addendum)
 [facility] Counselor/Therapist Progress Note  Patient ID: Paula Stout, MRN: [id number],    Date: 01/10/24 Time Spent: 4:02 PM until 4:58 PM, 56 minutes. The patient consented to the video teletherapy and was located in her home during this session. She is aware it is the responsibility of the patient to secure confidentiality on her end of the session. The provider was in a private home  for the duration of this session.      Treatment Type: Individual Therapy  Reported Symptoms: Anxiety, depression  Mental Status Exam: Appearance:  Casual     Behavior: Appropriate  Motor: Normal  Speech/Language:  Normal Rate  Affect: Appropriate  Mood: normal  Thought process: normal  Thought content:   WNL  Sensory/Perceptual disturbances:   WNL  Orientation: oriented to person, place, time/date, situation, day of week, month of year, and year  Attention: Good  Concentration: Good  Memory: WNL  Fund of knowledge:  Good  Insight:   Good  Judgment:  Good  Impulse Control: Good   Risk Assessment: Danger to Self:  No Self-injurious Behavior: No Danger to Others: No Duty to Warn:no Physical Aggression / Violence:No  Access to Firearms a concern: No  Gang Involvement:No   Subjective: The patient has not had as many syncope episodes estimating that she has had 3 in the past week which she is thankful about but said her blood pressure has been low continually.  She did call a cardiologist at the first appointment she can get us  in about 3 weeks.  The only change that made with her medication is to lower the metoprolol  and that does not seem to have helped.  Over the past couple of months especially after dinner she vomits of what ever she has been eating which is measurable.  She can eat a lot she did not affect her but dinner always seems to have that effect and it does not matter what she eats when she eats etc.  She does not think it is related to the Mounjaro  because she has  been on that since October of last year.  She is isolating more because her daughter and son-in-law are getting frustrated with her dog barking more but she says her dog only barks in response to her daughter's too big dogs approaching her dog.  I encouraged her to try to go outside when her daughter was not there or in another part of the house but she says her daughter stays in the den area almost all the time including sleeping on the sofa so there is not much time her daughter is not in the area where she has to go through with her dog to let them out.  Her daughter does not acknowledge the fact that her dog is doing something to provoke the patient's.  The patient also is getting more anxious about driving anywhere.  She canceled an audiologist appointment yesterday because she did not think she could go there.  She has an appointment tomorrow so we looked at several different ways that she could gradually workable a back into going there.  Tomorrow I suggested that she leave significantly early stopping 3-4 times on what would be about a 20-minute ride.  I encouraged her to do some relaxation breathing techniques before she left as well as cognitively look challenge some of the anxiety that comes and I encouraged her to do that in the 2 or 3 times that she stopped to see if that makes it  easier.  She thinks when she can get there she can go and okay. She does contract for safety having no thoughts of hurting herself or anyone else. Diagnosis: Generalized anxiety disorder, major depressive disorder, moderate, recurrent Plan: I will meet with the patient weekly via care agility.  Treatment plan: We will use cognitive behavioral therapy as well as person centered and supportive therapy in addition to elements of dialectical behavior therapy to help reduce the patient's anxiety and depression by at least 50% with a target date of October 06, 2023.  Goals for improving depression may include having less  sadness as indicated by patient report and scores on the PHQ-9, have improved mood and return to a healthier level of functioning, identify causes including environment for depressed mood and learn ways to cope with depression especially those connected to her medical issues.  Interventions include using cognitive behavioral therapy to explore and replace thoughts and behaviors.  We will look at how depression is experienced in day-to-day living and encouraged sharing of feelings.  We will encourage the use of coping skills for management of depressive symptoms.  Goals for reducing anxiety are to improve her ability to manage anxiety symptoms, better handle stress, identify causes for anxiety and explore ways to lower it, resolve the core conflicts contributing to anxiety as well as manage thoughts and worrisome thinking contributing to feelings of anxiety.  Interventions will include providing education about anxiety, facilitate problem solution skills to help her identify options for resolving stress, teach coping skills for managing anxiety as well as mindfulness and communication in terms of family to reduce her stress level, use cognitive behavior therapy to identify and change anxiety provoking thought and behavior patterns as well as teach distress tolerance and mindfulness skills for alleviating anxiety. Progress: 30% progress notes were reviewed with a new extended target date of April 04, 2024.  Cecile Coder, [facility]                                         Cecile Coder, [facility]               Cecile Coder, [facility]

## 2024-01-23 NOTE — Telephone Encounter (Signed)
 Patient called and states that the Vyepti is not covered by her insurance and would like to do Botox  and the Ajovy 

## 2024-01-23 NOTE — Patient Instructions (Addendum)
 Contact insurance about the IV infusion Vyepti and let me know if you wish to continue with Ajovy  and Botox  OR switch to Vyepti Nurtec once daily as needed for acute headache.  If no improvement, take baclofen .  Dose is limited due to kidney function Follow up 6 months

## 2024-01-24 NOTE — Telephone Encounter (Signed)
 Per Dr,Jaffe, OK.  The plan is for Botox  and Ajovy .  She has had improvement with Botox  but still frequent, so I added Ajovy  to help further decrease frequency.  She hasn't had Botox  since September because she missed December.  Would like to restart.    PA team please start a PA for Botox  200 units every 90 days.

## 2024-01-24 NOTE — Telephone Encounter (Signed)
 Called pt. appt scheduled.

## 2024-01-25 ENCOUNTER — Observation Stay (HOSPITAL_BASED_OUTPATIENT_CLINIC_OR_DEPARTMENT_OTHER)
Admission: EM | Admit: 2024-01-25 | Discharge: 2024-01-27 | Disposition: A | Discharge status: Home / Self Care | Payer: Medicare (Managed Care) | Attending: Emergency Medicine | Admitting: Emergency Medicine

## 2024-01-25 ENCOUNTER — Other Ambulatory Visit: Payer: Self-pay

## 2024-01-25 ENCOUNTER — Ambulatory Visit (INDEPENDENT_AMBULATORY_CARE_PROVIDER_SITE_OTHER): Payer: Medicare (Managed Care) | Admitting: Family Medicine

## 2024-01-25 ENCOUNTER — Encounter: Payer: Self-pay | Admitting: Family Medicine

## 2024-01-25 ENCOUNTER — Other Ambulatory Visit (HOSPITAL_COMMUNITY): Payer: Self-pay

## 2024-01-25 ENCOUNTER — Telehealth: Payer: Self-pay | Admitting: Pharmacy Technician

## 2024-01-25 ENCOUNTER — Emergency Department (HOSPITAL_BASED_OUTPATIENT_CLINIC_OR_DEPARTMENT_OTHER): Payer: Medicare (Managed Care)

## 2024-01-25 ENCOUNTER — Encounter (HOSPITAL_BASED_OUTPATIENT_CLINIC_OR_DEPARTMENT_OTHER): Payer: Self-pay | Admitting: Emergency Medicine

## 2024-01-25 VITALS — BP 88/60 | HR 73 | Temp 98.0°F | Resp 16 | Ht 66.0 in | Wt 159.0 lb

## 2024-01-25 DIAGNOSIS — J45909 Unspecified asthma, uncomplicated: Secondary | ICD-10-CM | POA: Insufficient documentation

## 2024-01-25 DIAGNOSIS — R296 Repeated falls: Secondary | ICD-10-CM | POA: Diagnosis not present

## 2024-01-25 DIAGNOSIS — I480 Paroxysmal atrial fibrillation: Secondary | ICD-10-CM | POA: Diagnosis present

## 2024-01-25 DIAGNOSIS — Z79899 Other long term (current) drug therapy: Secondary | ICD-10-CM | POA: Insufficient documentation

## 2024-01-25 DIAGNOSIS — M25562 Pain in left knee: Secondary | ICD-10-CM

## 2024-01-25 DIAGNOSIS — W19XXXA Unspecified fall, initial encounter: Secondary | ICD-10-CM

## 2024-01-25 DIAGNOSIS — I951 Orthostatic hypotension: Principal | ICD-10-CM

## 2024-01-25 DIAGNOSIS — K589 Irritable bowel syndrome without diarrhea: Secondary | ICD-10-CM | POA: Diagnosis not present

## 2024-01-25 DIAGNOSIS — I959 Hypotension, unspecified: Secondary | ICD-10-CM | POA: Diagnosis not present

## 2024-01-25 DIAGNOSIS — I4719 Other supraventricular tachycardia: Secondary | ICD-10-CM | POA: Insufficient documentation

## 2024-01-25 DIAGNOSIS — F319 Bipolar disorder, unspecified: Secondary | ICD-10-CM | POA: Insufficient documentation

## 2024-01-25 DIAGNOSIS — Z7982 Long term (current) use of aspirin: Secondary | ICD-10-CM | POA: Diagnosis not present

## 2024-01-25 DIAGNOSIS — E785 Hyperlipidemia, unspecified: Secondary | ICD-10-CM | POA: Insufficient documentation

## 2024-01-25 DIAGNOSIS — N3000 Acute cystitis without hematuria: Secondary | ICD-10-CM | POA: Diagnosis not present

## 2024-01-25 DIAGNOSIS — F4310 Post-traumatic stress disorder, unspecified: Secondary | ICD-10-CM | POA: Insufficient documentation

## 2024-01-25 DIAGNOSIS — I129 Hypertensive chronic kidney disease with stage 1 through stage 4 chronic kidney disease, or unspecified chronic kidney disease: Secondary | ICD-10-CM | POA: Diagnosis not present

## 2024-01-25 DIAGNOSIS — F418 Other specified anxiety disorders: Secondary | ICD-10-CM | POA: Insufficient documentation

## 2024-01-25 DIAGNOSIS — M546 Pain in thoracic spine: Secondary | ICD-10-CM | POA: Diagnosis not present

## 2024-01-25 DIAGNOSIS — R531 Weakness: Secondary | ICD-10-CM

## 2024-01-25 DIAGNOSIS — Z7901 Long term (current) use of anticoagulants: Secondary | ICD-10-CM | POA: Diagnosis not present

## 2024-01-25 DIAGNOSIS — N3001 Acute cystitis with hematuria: Secondary | ICD-10-CM

## 2024-01-25 DIAGNOSIS — N184 Chronic kidney disease, stage 4 (severe): Secondary | ICD-10-CM | POA: Diagnosis not present

## 2024-01-25 DIAGNOSIS — R55 Syncope and collapse: Principal | ICD-10-CM | POA: Diagnosis present

## 2024-01-25 DIAGNOSIS — K219 Gastro-esophageal reflux disease without esophagitis: Secondary | ICD-10-CM | POA: Insufficient documentation

## 2024-01-25 DIAGNOSIS — Z7984 Long term (current) use of oral hypoglycemic drugs: Secondary | ICD-10-CM | POA: Diagnosis not present

## 2024-01-25 DIAGNOSIS — K5900 Constipation, unspecified: Secondary | ICD-10-CM | POA: Diagnosis not present

## 2024-01-25 DIAGNOSIS — I471 Supraventricular tachycardia, unspecified: Secondary | ICD-10-CM | POA: Insufficient documentation

## 2024-01-25 DIAGNOSIS — E1122 Type 2 diabetes mellitus with diabetic chronic kidney disease: Secondary | ICD-10-CM | POA: Insufficient documentation

## 2024-01-25 DIAGNOSIS — E872 Acidosis, unspecified: Secondary | ICD-10-CM | POA: Insufficient documentation

## 2024-01-25 DIAGNOSIS — Z7722 Contact with and (suspected) exposure to environmental tobacco smoke (acute) (chronic): Secondary | ICD-10-CM | POA: Insufficient documentation

## 2024-01-25 LAB — URINALYSIS, ROUTINE W REFLEX MICROSCOPIC
Bilirubin Urine: NEGATIVE
Glucose, UA: >=500 mg/dL — AB
Hgb urine dipstick: NEGATIVE
Ketones, ur: NEGATIVE mg/dL
Nitrite: POSITIVE — AB
Protein, ur: 30 mg/dL — AB
Specific Gravity, Urine: >=1.03 (ref 1.005–1.030)
pH: 5.5 (ref 5.0–8.0)

## 2024-01-25 LAB — CBC WITH DIFFERENTIAL/PLATELET
Abs Immature Granulocytes: 0.01 10*3/uL (ref 0.00–0.07)
Basophils Absolute: 0 10*3/uL (ref 0.0–0.1)
Basophils Relative: 1 %
Eosinophils Absolute: 0.2 10*3/uL (ref 0.0–0.5)
Eosinophils Relative: 3 %
HCT: 35.4 % — ABNORMAL LOW (ref 36.0–46.0)
Hemoglobin: 11.6 g/dL — ABNORMAL LOW (ref 12.0–15.0)
Immature Granulocytes: 0 %
Lymphocytes Relative: 31 %
Lymphs Abs: 2 10*3/uL (ref 0.7–4.0)
MCH: 30.8 pg (ref 26.0–34.0)
MCHC: 32.8 g/dL (ref 30.0–36.0)
MCV: 93.9 fL (ref 80.0–100.0)
Monocytes Absolute: 0.6 10*3/uL (ref 0.1–1.0)
Monocytes Relative: 9 %
Neutro Abs: 3.6 10*3/uL (ref 1.7–7.7)
Neutrophils Relative %: 56 %
Platelets: 304 10*3/uL (ref 150–400)
RBC: 3.77 MIL/uL — ABNORMAL LOW (ref 3.87–5.11)
RDW: 14.6 % (ref 11.5–15.5)
WBC: 6.3 10*3/uL (ref 4.0–10.5)
nRBC: 0 % (ref 0.0–0.2)

## 2024-01-25 LAB — URINALYSIS, MICROSCOPIC (REFLEX)

## 2024-01-25 LAB — COMPREHENSIVE METABOLIC PANEL WITH GFR
ALT: 15 U/L (ref 0–44)
AST: 28 U/L (ref 15–41)
Albumin: 3.8 g/dL (ref 3.5–5.0)
Alkaline Phosphatase: 61 U/L (ref 38–126)
Anion gap: 14 (ref 5–15)
BUN: 18 mg/dL (ref 8–23)
CO2: 19 mmol/L — ABNORMAL LOW (ref 22–32)
Calcium: 9.7 mg/dL (ref 8.9–10.3)
Chloride: 107 mmol/L (ref 98–111)
Creatinine, Ser: 1.96 mg/dL — ABNORMAL HIGH (ref 0.44–1.00)
GFR, Estimated: 28 mL/min — ABNORMAL LOW (ref >60)
Glucose, Bld: 80 mg/dL (ref 70–99)
Potassium: 3.6 mmol/L (ref 3.5–5.1)
Sodium: 140 mmol/L (ref 135–145)
Total Bilirubin: 0.6 mg/dL (ref 0.0–1.2)
Total Protein: 6.4 g/dL — ABNORMAL LOW (ref 6.5–8.1)

## 2024-01-25 LAB — CBG MONITORING, ED: Glucose-Capillary: 83 mg/dL (ref 70–99)

## 2024-01-25 LAB — TROPONIN T, HIGH SENSITIVITY: Troponin T High Sensitivity: <15 ng/L (ref <19)

## 2024-01-25 MED ORDER — MIDODRINE HCL 10 MG PO TABS: 10.0000 mg | ORAL_TABLET | Freq: Three times a day (TID) | ORAL | 1 refills | Status: DC

## 2024-01-25 MED ORDER — SODIUM CHLORIDE 0.9 % IV BOLUS
1000.0000 mL | Freq: Once | INTRAVENOUS | Status: AC
  Administered 2024-01-25: 1000 mL via INTRAVENOUS

## 2024-01-25 MED ORDER — ACETAMINOPHEN 325 MG PO TABS
650.0000 mg | ORAL_TABLET | Freq: Four times a day (QID) | ORAL | Status: DC | PRN
  Administered 2024-01-25 – 2024-01-26 (×2): 650 mg via ORAL
  Filled 2024-01-25 (×2): qty 2

## 2024-01-25 MED ORDER — LACTATED RINGERS IV BOLUS: 1000.0000 mL | Freq: Once | INTRAVENOUS | Status: DC

## 2024-01-25 MED ORDER — PREDNISONE 20 MG PO TABS: 40.0000 mg | ORAL_TABLET | Freq: Every day | ORAL | 0 refills | Status: AC

## 2024-01-25 MED ORDER — SODIUM CHLORIDE 0.9 % IV SOLN
1.0000 g | Freq: Once | INTRAVENOUS | Status: AC
  Administered 2024-01-25: 1 g via INTRAVENOUS
  Filled 2024-01-25: qty 10

## 2024-01-25 NOTE — ED Triage Notes (Addendum)
 Reports multiple fall in 4 days , had syncope episode in bathroom 4 nights ago . Dizzy , back pain . Alert and oriented x 4 . Pt sent from PCP same building .  Adds on Eliquis  , hit her head against the floor x 2 , and the bathtub once 4 days ago

## 2024-01-25 NOTE — ED Notes (Signed)
 Called [facility] for transport.

## 2024-01-25 NOTE — Telephone Encounter (Signed)
 PA has been submitted, and telephone encounter has been created. Please see telephone encounter dated 5.21.25.

## 2024-01-25 NOTE — Telephone Encounter (Signed)
 Pharmacy Patient Advocate Encounter  Pharmacy Benefit PA has been submitted for Botox - [phone] via CoverMyMeds.  INSURANCE: [employer] KEY/EOC/FAX: [id number] Procedure code 19147  Pending [facility] report require a PA Status is Submitted

## 2024-01-25 NOTE — ED Provider Notes (Signed)
  Physical Exam  BP 96/72   Pulse 64   Temp (!) 96.8 F (36 C)   Resp 18   Wt 72 kg   SpO2 100%   BMI 25.62 kg/m   Physical Exam  Procedures  Procedures  ED Course / MDM   Clinical Course as of 01/25/24 1914  Wed Jan 25, 2024  1826 Seen by pcp today. 3 witnessed episodes of LOC today, she also had mild hypoglycemia today.  Has been having multiple falls sent by her PCP for further eval after falls.  She is on Eliquis  for A-fib denies melena or hematochezia.  Symptoms of near syncope were with change in position.  Suprapubic tenderness on examination today with positive UTI symptoms. [AH]    Clinical Course User Index [AH] Tama Fails, PA-C   Medical Decision Making Amount and/or Complexity of Data Reviewed Labs: ordered. Radiology: ordered.  Risk Decision regarding hospitalization.   68 year old female here with multiple episodes of syncope.  Labs reviewed: CBC without low hemoglobin.  Creatinine at baseline.  Troponin negative x 2 urine positive for infection.  I visualized and interpreted all images, no acute findings.   EKG shows sinus rhythm at a rate of 67.   Case discussed with Dr. Melvinia Stager who will admit the patient.        Tama Fails, PA-C 01/26/24 1811    Mordecai Applebaum, MD 02/01/24 [phone]

## 2024-01-25 NOTE — ED Provider Notes (Addendum)
 [place] [facility] Provider Note   CSN: [id number] Arrival date & time: 01/25/24  1626     History  Chief Complaint  Patient presents with   Fall   Loss of Consciousness    Paula Stout is a 68 y.o. female.   Fall Associated symptoms include chest pain and shortness of breath.  Loss of Consciousness Associated symptoms: chest pain and shortness of breath   Patient is a 68 year old female presents the ED today with complaints of multiple falls, hitting head while on Eliquis , multiple episodes of syncope over the last 4 days.  Notes that 3 of the falls witnessed and no postictal state was present, denying incontinence, tongue laceration.  Was seen by PCP who after being alerted that she had fallen multiple times with multiple syncopal episodes, referred her down to the ED.  Notes that she has also been experiencing some shortness of breath and mild chest pain, radiating from her back. Reports that each time she has had a syncopal episode, happened after she was sitting for prolonged period and stood up quickly.  Also notes that she believes her Dexcom blood sugar monitor is off as she notes that it was reading higher than when she took a fingerstick glucose test.  Feeling that her single episodes have been related to her low blood sugar.  Also endorses mild suprapubic pain to palpation, chest pain. Faithfully taking her Eliquis  without reporting any missed doses.  Denies fever, headache, visual changes, vertigo, cough, unilateral weakness, lower leg swelling.     .  Medical history of A-fib, bipolar 1, migraines, CKD, type 2 diabetes,. Home Medications Prior to Admission medications   Medication Sig Start Date End Date Taking? Authorizing Provider  acetaminophen  (TYLENOL ) 500 MG tablet Take 1,000 mg by mouth every 6 (six) hours as needed for mild pain.    [provider]  apixaban  (ELIQUIS ) 5 MG TABS tablet Take 1 tablet (5 mg  total) by mouth 2 (two) times daily. 01/18/24   Jobe Mulder, DO  atorvastatin  (LIPITOR ) 80 MG tablet TAKE 1 TABLET DAILY 01/03/24   Jobe Mulder, DO  Baclofen  5 MG TABS Take 1 tablet (5 mg total) by mouth 2 (two) times daily as needed. 01/23/24   Merriam Abbey, DO  blood glucose meter kit and supplies One Touch Ultra  Check blood sugars once daily Dx: E11.9 Patient taking differently: 1 each by Other route See admin instructions. One Touch Ultra  Check blood sugars once daily Dx: E11.9 10/01/21   Jobe Mulder, DO  budesonide -formoterol  (SYMBICORT ) 80-4.5 MCG/ACT inhaler Inhale 2 puffs into the lungs 2 (two) times daily. 08/11/23   Cobb, Mariah Shines, NP  buPROPion  (WELLBUTRIN  XL) 300 MG 24 hr tablet Take 1 tablet (300 mg total) by mouth daily. 10/19/23   Lincoln Renshaw, NP  Continuous Glucose Sensor (DEXCOM G7 SENSOR) MISC Check blood sugars as directed 01/19/24   Jobe Mulder, DO  diltiazem  (CARDIZEM  CD) 120 MG 24 hr capsule Take 120 mg by mouth daily. 12/08/23   [provider]  EPINEPHrine  0.3 mg/0.3 mL IJ SOAJ injection Inject 0.3 mg into the muscle as needed for anaphylaxis. Patient not taking: Reported on 01/23/2024 12/09/23   Jobe Mulder, DO  escitalopram  (LEXAPRO ) 20 MG tablet Take 1 tablet (20 mg total) by mouth daily. 12/09/23   Lincoln Renshaw, NP  ezetimibe  (ZETIA ) 10 MG tablet TAKE 1 TABLET DAILY 12/26/23   Jobe Mulder,  DO  FARXIGA  10 MG TABS tablet TAKE 1 TABLET DAILY 08/09/23   Jobe Mulder, DO  fenofibrate  160 MG tablet Take 1 tablet (160 mg total) by mouth daily. 09/21/23   Jobe Mulder, DO  fluticasone  (FLONASE ) 50 MCG/ACT nasal spray Place 2 sprays into both nostrils daily. 01/29/23   Yevette Hem, FNP  Fremanezumab -vfrm (AJOVY ) 225 MG/1.5ML SOAJ Inject 225 mg into the skin every 30 (thirty) days. 11/28/23   Merriam Abbey, DO  levocetirizine (XYZAL ) 5 MG tablet TAKE 1 TABLET EVERY EVENING 12/01/23    Wendling, Shellie Dials, DO  LORazepam  (ATIVAN ) 0.5 MG tablet Take 1 tablet (0.5 mg total) by mouth every 8 (eight) hours. 10/19/23   Lincoln Renshaw, NP  Lurasidone  HCl 120 MG TABS Take 1 tablet (120 mg total) by mouth at bedtime. 10/19/23   Lincoln Renshaw, NP  magnesium  oxide (MAG-OX) 400 (240 Mg) MG tablet Take 400 mg by mouth daily.    [provider]  metoprolol  tartrate (LOPRESSOR ) 25 MG tablet Take 1 tablet (25 mg total) by mouth every 4 (four) hours as needed (PALPITATIONS). 08/24/22   Camnitz, Babetta Lesch, MD  midodrine  (PROAMATINE ) 10 MG tablet Take 1 tablet (10 mg total) by mouth 3 (three) times daily. 01/25/24   Jobe Mulder, DO  Multiple Vitamins-Minerals (MULTIVITAMIN WITH MINERALS) tablet Take 1 tablet by mouth daily. Daily Diabetes Health Pack - MVI + chromium, fish oil + vit D, magnesium , vit C, alpha lipoic acid w/ green tea    [provider]  nitroGLYCERIN  (NITROSTAT ) 0.4 MG SL tablet Place 1 tablet (0.4 mg total) under the tongue every 5 (five) minutes as needed for chest pain. Patient not taking: Reported on 01/23/2024 03/16/23   Krasowski, Robert J, MD  ondansetron  (ZOFRAN ) 4 MG tablet Take 1 tablet (4 mg total) by mouth every 6 (six) hours. 01/09/24   Felicie Horning, PA-C  ONETOUCH VERIO test strip USE DAILY TO CHECK BLOOD SUGAR Patient taking differently: 1 each by Other route See admin instructions. Use daily to check blood sugar. 10/29/22   Jobe Mulder, DO  pantoprazole  (PROTONIX ) 40 MG tablet TAKE 1 TABLET DAILY 12/26/23   Jobe Mulder, DO  predniSONE  (DELTASONE ) 20 MG tablet Take 2 tablets (40 mg total) by mouth daily with breakfast for 5 days. 01/25/24 01/30/24  Jobe Mulder, DO  QUEtiapine  (SEROQUEL ) 300 MG tablet Take 1 tablet (300 mg total) by mouth at bedtime. 10/19/23   Lincoln Renshaw, NP  Rimegepant Sulfate (NURTEC) 75 MG TBDP Take 1 tablet (75 mg total) by mouth as needed (take 1 tab at the earlist onset of a  migraine. Max 1 tab in 24 hours). 04/04/23   Merriam Abbey, DO  topiramate  (TOPAMAX ) 50 MG tablet TAKE 3 TABLETS TWICE A DAY 08/23/23   Jobe Mulder, DO      Allergies    Bee venom, Onion, Tetanus-diphtheria toxoids td, Sumatriptan, Zolpidem , Asa [aspirin ], Iodine, Nsaids, Sulfa antibiotics, and Vancomycin    Review of Systems   Review of Systems  Respiratory:  Positive for shortness of breath.   Cardiovascular:  Positive for chest pain and syncope.  Neurological:  Positive for syncope.  All other systems reviewed and are negative.   Physical Exam Updated Vital Signs BP 96/72   Pulse 64   Temp (!) 96.8 F (36 C)   Resp 18   Wt 72 kg   SpO2 100%   BMI 25.62 kg/m  Physical  Exam Vitals and nursing note reviewed.  Constitutional:      General: She is not in acute distress.    Appearance: Normal appearance. She is not ill-appearing.  HENT:     Head: Normocephalic and atraumatic.     Right Ear: Tympanic membrane, ear canal and external ear normal.     Left Ear: Tympanic membrane, ear canal and external ear normal.     Mouth/Throat:     Mouth: Mucous membranes are moist.     Pharynx: No oropharyngeal exudate or posterior oropharyngeal erythema.  Eyes:     General: No scleral icterus.       Right eye: No discharge.        Left eye: No discharge.     Extraocular Movements: Extraocular movements intact.     Conjunctiva/sclera: Conjunctivae normal.  Cardiovascular:     Rate and Rhythm: Normal rate and regular rhythm.     Pulses: Normal pulses.     Heart sounds: Normal heart sounds. No murmur heard.    No friction rub. No gallop.  Pulmonary:     Effort: Pulmonary effort is normal. No respiratory distress.     Breath sounds: Normal breath sounds. No stridor. No wheezing, rhonchi or rales.  Abdominal:     General: Abdomen is flat.     Palpations: Abdomen is soft.     Tenderness: There is abdominal tenderness (Mild suprapubic tenderness noted to palpation).   Musculoskeletal:        General: No deformity.     Cervical back: Normal range of motion and neck supple. No rigidity or tenderness.     Right lower leg: No edema.     Left lower leg: No edema.  Skin:    General: Skin is warm and dry.     Findings: Bruising (Left-sided ecchymosis noted to the posterior ribs) present. No erythema.  Neurological:     General: No focal deficit present.     Mental Status: She is alert and oriented to person, place, and time. Mental status is at baseline.     Cranial Nerves: No cranial nerve deficit.     Sensory: No sensory deficit.     Motor: No weakness.     Comments: No facial asymmetry, no ataxia, no apraxia, no aphasia, no arm drift, normal coordination with finger-to-nose, normal sensation to both upper and lower extremities bilaterally, normal grip strength bilaterally, normal strength to both flexion and extension to both upper lower extremities 5+ bilaterally, no visual field deficits, no nystagmus.   Psychiatric:        Mood and Affect: Mood normal.     ED Results / Procedures / Treatments   Labs (all labs ordered are listed, but only abnormal results are displayed) Labs Reviewed  URINALYSIS, ROUTINE W REFLEX MICROSCOPIC - Abnormal; Notable for the following components:      Result Value   APPearance HAZY (*)    Glucose, UA >=500 (*)    Protein, ur 30 (*)    Nitrite POSITIVE (*)    Leukocytes,Ua TRACE (*)    All other components within normal limits  URINALYSIS, MICROSCOPIC (REFLEX) - Abnormal; Notable for the following components:   Bacteria, UA FEW (*)    All other components within normal limits  URINE CULTURE  CBC WITH DIFFERENTIAL/PLATELET  COMPREHENSIVE METABOLIC PANEL WITH GFR  CBG MONITORING, ED  TROPONIN T, HIGH SENSITIVITY  TROPONIN T, HIGH SENSITIVITY    EKG None  Radiology No results found.  Procedures Procedures  Medications Ordered in ED Medications  cefTRIAXone (ROCEPHIN) 1 g in sodium chloride  0.9 % 100  mL IVPB (has no administration in time range)  sodium chloride  0.9 % bolus 1,000 mL (has no administration in time range)    ED Course/ Medical Decision Making/ A&P Clinical Course as of 01/25/24 1851  Wed Jan 25, 2024  1826 Seen by pcp today. 3 witnessed episodes of LOC today, she also had mild hypoglycemia today.  Has been having multiple falls sent by her PCP for further eval after falls.  She is on Eliquis  for A-fib denies melena or hematochezia.  Symptoms of near syncope were with change in position.  Suprapubic tenderness on examination today with positive UTI symptoms. [AH]    Clinical Course User Index [AH] Tama Fails, PA-C                                 Medical Decision Making Amount and/or Complexity of Data Reviewed Labs: ordered. Radiology: ordered.   This patient is a 68 year old female who presents to the ED for concern of multiple falls on Eliquis , multiple syncopal episodes, back pain, chest pain, with mild shortness of breath.    On physical exam, patient is in no acute distress, afebrile, alert and orient x 4, speaking in full sentences, nontachypneic, nontachycardic.  PERRL, EOMs normal.  Normal neuroexam.  Notably had ecchymosis to the left posterior ribs.  No trauma noted to head.  LCTAB.  No murmurs.  Unremarkable exam otherwise.  With posterior ecchymosis noted, fall on thinners with multiple episodes of syncope, CT scans were ordered as well as labs.  Low suspicion for PE as patient has been faithfully taking Eliquis  without missed doses and patient is not experiencing any pleuritic chest pain at this time.  Awaiting labs at this time, patient was provided Rocephin for positive nitrites on UA and fluids due to soft blood pressures.  Awaiting further labs and imaging, anticipate that patient will likely need to be admitted due to the repeated falls, chest pain, orthostatic hypotension and UTI if CT shows any other injury.  However if only orthostatic hypotension  and hypoglycemia, likely able to discharge with fluids and blood sugar correction  Differential diagnoses prior to evaluation: The emergent differential diagnosis includes, but is not limited to, intracranial bleed, vasovagal episode, arrhythmia, ACS, PE, pneumonia, UTI, fracture, dislocation, ligament injury, malalignment, hypoglycemia, metabolic disturbance. This is not an exhaustive differential.   Past Medical History / Co-morbidities / Social History: Type 2 diabetes, migraines, bipolar 1, A-fib currently on Eliquis , CKD, asthma, HLD, HTN, pericarditis, junctional tachycardia, polyneuropathy  Additional history: Chart reviewed. Pertinent results include:   Last seen in the ED on 01/09/2024 for bad headache Seen by neurology on 01/23/2024 for chronic migraines, looking to switch to the Vyepti due to cost of Botox  and to continue topiramate .  Using Nurtec and Zofran  as rescue and follow-up in 6 months.  Previous admitted on 09/17/2023 for syncopal episodes, undergoing echo and CTA at that time.  Noted to have her last echo on 06/24/2023 which showed a 60 to 65% EF with mild left ventricular hypertrophy.  Noting that syncope episode was likely due to orthostatic hypotension.  Lab Tests/Imaging studies: I personally interpreted labs/imaging and the pertinent results include:    Glucose noted be 83. Troponin pending, urine culture collected CBC pending CMP pending UA notes nitrites, trace leukocytes and few bacteria.  CT scans pending  Cardiac  monitoring: EKG obtained and interpreted by myself and attending physician which shows: Sinus rhythms with PACs   Medications: I ordered medication including Rocephin and normal saline.  I have reviewed the patients home medicines and have made adjustments as needed.  Disposition: 6:51 PM Care of Paula Stout transferred to [facility] and Dr. Isaiah Marc at the end of my shift as the patient will require reassessment once labs/imaging have  resulted. Patient presentation, ED course, and plan of care discussed with review of all pertinent labs and imaging. Please see his/her note for further details regarding further ED course and disposition. Plan at time of handoff is awaiting CTs, providing Rocephin and fluids, anticipate admission if CT scans show any abnormalities as patient had frequent falls with UTI.  If vasovagal, hypoglycemia alone likely be able to discharge.. This may be altered or completely changed at the discretion of the oncoming team pending results of further workup.  Final Clinical Impression(s) / ED Diagnoses Final diagnoses:  Syncope, unspecified syncope type    Rx / DC Orders ED Discharge Orders     None         Hayes Lipps, PA-C 01/25/24 1851    Hayes Lipps, PA-C 01/25/24 1858    Mordecai Applebaum, MD 01/25/24 2258

## 2024-01-25 NOTE — Hospital Course (Signed)
 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  Patient is complaining about dizziness and back pain.  She is alert oriented x 4. Patient reported total 4 syncope episode throughout the day today.   In January 2025 patient was admitted for syncope and extensive workup showed orthostatic hypotension provoking syncope.  At presentation to ED patient is hemodynamically stable. EKG showed normal sinus rhythm heart rate 67. Normal troponin. CMP unremarkable except low bicarb 19, creatinine and GFR at baseline. CBC unremarkable normal WBC count, normal platelet count and stable H&H. UA showing evidence of UTI pending urine culture. Blood glucose within normal range.  CT head and CT cervical spine no acute abnormality. CT chest no acute chest/thoracic spine finding.  No rib fracture. 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 CT T-spine no evidence of fracture.  In the ED patient has been treated with 1 L of NS bolus and ceftriaxone 1 g.  Hospitalist has been consulted for further management of acute cystitis and recurrent syncope.

## 2024-01-25 NOTE — Progress Notes (Signed)
 Chief Complaint  Patient presents with   Follow-up    Follow Up    Subjective: Patient is a 68 y.o. female here for falls.  The patient has been dealing with falls.  Initially was thought to be due to hypoglycemia and possibly low blood pressure.  Blood pressure had previously been fine but running in 80's/50's over weekend. She is on Midodrine  but only taking at night as she was told to take it with food.  Her diabetic regimen has been reduced.  Sugars still running in the 70s when she checks via fingerstick but her CGM is showing low 100s.  Has not been eating very much.  Tries to stay hydrated but this has been difficult.  She fell again 4 times over the weekend.  1 time she felt everything go black.  She has had several episodes since then where she feels like she is going to pass out but never fully lost consciousness.  When she fell several times, she reports falling on her back.  Her daughter noticed bruising yesterday.  She also had bruising over her left knee.  There is less pain in that area.  Past Medical History:  Diagnosis Date   Adhesive capsulitis of left shoulder 12/16/2016   Asthma without status asthmaticus 02/06/2021   At risk for falls 02/04/2019   Atrial fibrillation (HCC) 09/01/2014   Last Assessment & Plan:  Formatting of this note might be different from the original. A:  Chronic.  Sinus rhythm at this time.  States she was told this during admission at [facility] in the past. P:  On baby aspirin  at home will continue.  Low dose metoprolol  started.   Bipolar 1 disorder, depressed, moderate (HCC) 02/18/2020   Capsulitis of left shoulder 10/20/2021   Chronic anticoagulation 02/20/2020   Chronic atrial fibrillation (HCC) 02/18/2020   Chronic headache 10/11/2013   Chronic interstitial cystitis 12/28/2005   Chronic migraine without aura, intractable, without status migrainosus 06/24/2017   Colon cancer screening 04/15/2016   Last Assessment & Plan:  Formatting of this  note might be different from the original. Will schedule for colonoscopy   Depression, recurrent (HCC) 02/18/2020   Diabetes mellitus (HCC)    Dyslipidemia 02/20/2020   GAD (generalized anxiety disorder) 02/18/2020   GERD (gastroesophageal reflux disease) 11/05/2021   H/O chest pain 07/31/2021   History of atrial fibrillation 06/24/2017   History of posttraumatic stress disorder (PTSD) 10/07/2016   Hyperosmolarity due to secondary diabetes mellitus (HCC) 02/06/2021   Impaired mobility and activities of daily living 11/14/2015   Irritable bowel syndrome (IBS)    Junctional tachycardia (HCC) 11/05/2021   Kidney stone 02/06/2021   Migraine without aura, not refractory 02/06/2021   Mixed hyperlipidemia 04/06/2021   Last Assessment & Plan:  Formatting of this note might be different from the original. 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 Controlled   Refractory migraine with aura 02/06/2021   Refractory migraine without aura 02/06/2021   Renal colic on left side 04/22/2017   Stage 3 chronic kidney disease (HCC) 02/20/2020   Thyroid  goiter    10/17/23 CT: right thyorid goiter with substernal extension, benign FNA 07/07/23   Unspecified adverse effect of  other drug, medicinal and biological substance(995.29) 02/17/2006   Urinary tract obstruction 02/06/2021    Objective: BP (!) 88/60 (BP Location: Left Arm, Patient Position: Sitting)   Pulse 73   Temp 98 F (36.7 C) (Oral)   Resp 16   Ht 5\' 6"  (1.676 m)   Wt 159 lb (72.1 kg)   SpO2 98%   BMI 25.66 kg/m  General: Awake, appears stated age Heart: RRR, no LE edema Lungs: CTAB, no rales, wheezes or rhonchi. No  accessory muscle use Skin: Ecchymosis noted over the left thoracic spine.  Slight ecchymosis also noted over the anterior left knee over the patellar tendon. MSK: TTP over the left midline of the thoracic spine without deformity or edema; no significant TTP over the anterior left knee. Psych: Age appropriate judgment and insight, normal affect and mood  Assessment and Plan: Hypotension, unspecified hypotension type - Plan: midodrine  (PROAMATINE ) 10 MG tablet  Syncope, unspecified syncope type  Fall, initial encounter - Plan: Ambulatory referral to Physical Therapy  Acute left-sided thoracic back pain - Plan: predniSONE  (DELTASONE ) 20 MG tablet  Acute pain of left knee  Will tentatively increase her midodrine  from 5 mg 3 times daily to 10 mg 3 times daily.  Hopefully she can take it more than once per day.  Recheck of her blood pressure was 84/58.  We sent her downstairs to the ER after speaking with the ER attending informing him of the situation.  She was taken down by wheelchair.  If she received some fluids and improves enough to be discharged, she will start this regimen tomorrow.  We will stop her Mounjaro  for now.  Hopefully this helps with both appetite and potential hypoglycemia. As above.  Appreciate cardiology.  She reports she did update their office about the situation today. She will probably get imaging in the ER.  Refer to physical therapy should she be discharged home tonight.  I will also send in the 5-day prednisone  burst to hopefully help with both appetite, sugars, and pain as she is on blood thinner. As above. This seems okay.  She will let me know if anything changes. The patient voiced understanding and agreement to the plan.  I spent around 41 minutes with the patient discussing the above plans in addition to reviewing her chart and coordinating care on the same day of the visit.  Shellie Dials Allouez, DO 01/25/24  4:48 PM

## 2024-01-25 NOTE — Telephone Encounter (Signed)
 Pharmacy Patient Advocate Encounter   Received notification from Pt Calls Messages that prior authorization for AJOVY  225MG  is required/requested.   Insurance verification completed.   The patient is insured through [employer] .   Per test claim: PA required; PA submitted to above mentioned insurance via CoverMyMeds Key/confirmation #/EOC [id number] Status is pending

## 2024-01-25 NOTE — ED Notes (Signed)
 Unsuccessful attempt with IV.  Pt currently in scan

## 2024-01-25 NOTE — Telephone Encounter (Signed)
 Pharmacy Patient Advocate Encounter- Injection via Pharmacy Benefit:  PA was submitted  for Botox - J0585 to [employer] and has been approved through: 4.21.25 TO 5.21.26 Authorization# [id number]  Please send prescription to Specialty Pharmacy: [facility]: [id number]  Estimated Pharmacy Copay is: $0  Patient IS NOT eligible for Botox - J0585 Copay Card, which will make patient's copay as little as zero. Copay card will be provided to pharmacy.   Admin Code: 93235   Pending BotoxOne report require Prior Auth. Patient's remaining deductible: PENDING Patient estimated coinsurance and/or copay for procedure code: PENDING

## 2024-01-25 NOTE — Telephone Encounter (Signed)
 Pharmacy Patient Advocate Encounter   Botox  One Portal verification has been Submitted Benefit Verification #:  [id number]   Primary Insurance: [employer] Dx Code: G43.709 J-code: Botox - Z6109 Procedure code: 60454

## 2024-01-25 NOTE — Telephone Encounter (Signed)
 Pharmacy Patient Advocate Encounter  Received notification from [employer] that Prior Authorization for AJOVY  225MG  has been CANCELLED due to

## 2024-01-25 NOTE — Telephone Encounter (Signed)
 Pt states she continues to have syncopal episodes, she is going to see her PCP this afternoon for further discussion/evaluation.  Aware I am forwarding to our scheduler to arrange ASAP OV w/ Dr. Camnitz/Tillery/Ursuy for ILR implant......Aaron Aas  pt agreeable to plan.

## 2024-01-26 ENCOUNTER — Other Ambulatory Visit (HOSPITAL_COMMUNITY): Payer: Self-pay

## 2024-01-26 ENCOUNTER — Other Ambulatory Visit: Payer: Self-pay

## 2024-01-26 ENCOUNTER — Observation Stay (HOSPITAL_COMMUNITY): Payer: Medicare (Managed Care)

## 2024-01-26 ENCOUNTER — Encounter: Payer: Self-pay | Admitting: Family Medicine

## 2024-01-26 ENCOUNTER — Ambulatory Visit (INDEPENDENT_AMBULATORY_CARE_PROVIDER_SITE_OTHER): Payer: Medicare (Managed Care) | Admitting: Behavioral Health

## 2024-01-26 ENCOUNTER — Encounter: Payer: Self-pay | Admitting: Behavioral Health

## 2024-01-26 ENCOUNTER — Observation Stay (HOSPITAL_BASED_OUTPATIENT_CLINIC_OR_DEPARTMENT_OTHER): Payer: Medicare (Managed Care)

## 2024-01-26 DIAGNOSIS — F411 Generalized anxiety disorder: Secondary | ICD-10-CM | POA: Diagnosis not present

## 2024-01-26 DIAGNOSIS — F319 Bipolar disorder, unspecified: Secondary | ICD-10-CM

## 2024-01-26 DIAGNOSIS — R55 Syncope and collapse: Secondary | ICD-10-CM

## 2024-01-26 DIAGNOSIS — I3139 Other pericardial effusion (noninflammatory): Secondary | ICD-10-CM

## 2024-01-26 DIAGNOSIS — F331 Major depressive disorder, recurrent, moderate: Secondary | ICD-10-CM

## 2024-01-26 DIAGNOSIS — R296 Repeated falls: Secondary | ICD-10-CM

## 2024-01-26 LAB — BASIC METABOLIC PANEL WITH GFR
Anion gap: 10 (ref 5–15)
BUN: 14 mg/dL (ref 8–23)
CO2: 16 mmol/L — ABNORMAL LOW (ref 22–32)
Calcium: 8.8 mg/dL — ABNORMAL LOW (ref 8.9–10.3)
Chloride: 114 mmol/L — ABNORMAL HIGH (ref 98–111)
Creatinine, Ser: 1.73 mg/dL — ABNORMAL HIGH (ref 0.44–1.00)
GFR, Estimated: 32 mL/min — ABNORMAL LOW (ref >60)
Glucose, Bld: 89 mg/dL (ref 70–99)
Potassium: 3.4 mmol/L — ABNORMAL LOW (ref 3.5–5.1)
Sodium: 140 mmol/L (ref 135–145)

## 2024-01-26 LAB — ECHOCARDIOGRAM COMPLETE
Area-P 1/2: 3.77 cm2
Calc EF: 59.6 %
Height: 66 in
S' Lateral: 2.8 cm
Single Plane A2C EF: 61.1 %
Single Plane A4C EF: 57 %
Weight: 2539.7 [oz_av]

## 2024-01-26 LAB — BLOOD GAS, VENOUS
Acid-base deficit: 5.6 mmol/L — ABNORMAL HIGH (ref 0.0–2.0)
Bicarbonate: 18.2 mmol/L — ABNORMAL LOW (ref 20.0–28.0)
O2 Saturation: 92.7 %
Patient temperature: 36.9
pCO2, Ven: 30 mmHg — ABNORMAL LOW (ref 44–60)
pH, Ven: 7.39 (ref 7.25–7.43)
pO2, Ven: 57 mmHg — ABNORMAL HIGH (ref 32–45)

## 2024-01-26 LAB — GLUCOSE, CAPILLARY
Glucose-Capillary: 134 mg/dL — ABNORMAL HIGH (ref 70–99)
Glucose-Capillary: 81 mg/dL (ref 70–99)
Glucose-Capillary: 86 mg/dL (ref 70–99)
Glucose-Capillary: 89 mg/dL (ref 70–99)

## 2024-01-26 LAB — HIV ANTIBODY (ROUTINE TESTING W REFLEX): HIV Screen 4th Generation wRfx: NONREACTIVE

## 2024-01-26 MED ORDER — RIMEGEPANT SULFATE 75 MG PO TBDP
75.0000 mg | ORAL_TABLET | ORAL | Status: DC | PRN
Start: 2024-01-26 — End: 2024-01-26

## 2024-01-26 MED ORDER — ONDANSETRON HCL 4 MG/2ML IJ SOLN
4.0000 mg | Freq: Four times a day (QID) | INTRAMUSCULAR | Status: DC | PRN
  Administered 2024-01-26 (×2): 4 mg via INTRAVENOUS
  Filled 2024-01-26 (×2): qty 2

## 2024-01-26 MED ORDER — ACETAMINOPHEN 325 MG PO TABS
650.0000 mg | ORAL_TABLET | Freq: Four times a day (QID) | ORAL | Status: DC | PRN
Start: 2024-01-26 — End: 2024-01-27

## 2024-01-26 MED ORDER — SODIUM CHLORIDE 0.9 % IV SOLN
1.0000 g | INTRAVENOUS | Status: DC
  Administered 2024-01-26: 1 g via INTRAVENOUS
  Filled 2024-01-26: qty 10

## 2024-01-26 MED ORDER — KETOROLAC TROMETHAMINE 15 MG/ML IJ SOLN: 15.0000 mg | Freq: Four times a day (QID) | INTRAMUSCULAR | Status: DC | PRN

## 2024-01-26 MED ORDER — DAPAGLIFLOZIN PROPANEDIOL 10 MG PO TABS
10.0000 mg | ORAL_TABLET | Freq: Every day | ORAL | Status: DC
  Administered 2024-01-26 – 2024-01-27 (×2): 10 mg via ORAL
  Filled 2024-01-26 (×2): qty 1

## 2024-01-26 MED ORDER — BUPROPION HCL ER (XL) 150 MG PO TB24
300.0000 mg | ORAL_TABLET | Freq: Every day | ORAL | Status: DC
  Administered 2024-01-26 – 2024-01-27 (×2): 300 mg via ORAL
  Filled 2024-01-26 (×2): qty 2

## 2024-01-26 MED ORDER — MAGNESIUM OXIDE -MG SUPPLEMENT 400 (240 MG) MG PO TABS
400.0000 mg | ORAL_TABLET | Freq: Every day | ORAL | Status: DC
  Administered 2024-01-26 – 2024-01-27 (×2): 400 mg via ORAL
  Filled 2024-01-26 (×2): qty 1

## 2024-01-26 MED ORDER — POTASSIUM CHLORIDE CRYS ER 20 MEQ PO TBCR
20.0000 meq | EXTENDED_RELEASE_TABLET | Freq: Two times a day (BID) | ORAL | Status: DC
  Administered 2024-01-26 – 2024-01-27 (×3): 20 meq via ORAL
  Filled 2024-01-26 (×3): qty 1

## 2024-01-26 MED ORDER — FENOFIBRATE 160 MG PO TABS
160.0000 mg | ORAL_TABLET | Freq: Every day | ORAL | Status: DC
  Administered 2024-01-26 – 2024-01-27 (×2): 160 mg via ORAL
  Filled 2024-01-26 (×2): qty 1

## 2024-01-26 MED ORDER — SUMATRIPTAN SUCCINATE 25 MG PO TABS: 25.0000 mg | ORAL_TABLET | ORAL | Status: DC | PRN

## 2024-01-26 MED ORDER — INSULIN ASPART 100 UNIT/ML IJ SOLN: 0.0000 [IU] | Freq: Three times a day (TID) | INTRAMUSCULAR | Status: DC

## 2024-01-26 MED ORDER — LORAZEPAM 0.5 MG PO TABS: 0.5000 mg | ORAL_TABLET | Freq: Three times a day (TID) | ORAL | Status: DC | PRN

## 2024-01-26 MED ORDER — EZETIMIBE 10 MG PO TABS
10.0000 mg | ORAL_TABLET | Freq: Every day | ORAL | Status: DC
Start: 2024-01-26 — End: 2024-01-27
  Administered 2024-01-26 – 2024-01-27 (×2): 10 mg via ORAL
  Filled 2024-01-26 (×2): qty 1

## 2024-01-26 MED ORDER — APIXABAN 5 MG PO TABS
5.0000 mg | ORAL_TABLET | Freq: Two times a day (BID) | ORAL | Status: DC
Start: 2024-01-26 — End: 2024-01-27
  Administered 2024-01-26 – 2024-01-27 (×3): 5 mg via ORAL
  Filled 2024-01-26 (×3): qty 1

## 2024-01-26 MED ORDER — TOPIRAMATE 25 MG PO TABS
150.0000 mg | ORAL_TABLET | Freq: Two times a day (BID) | ORAL | Status: DC
  Administered 2024-01-26 – 2024-01-27 (×3): 150 mg via ORAL
  Filled 2024-01-26 (×3): qty 6

## 2024-01-26 MED ORDER — QUETIAPINE FUMARATE 100 MG PO TABS
300.0000 mg | ORAL_TABLET | Freq: Every day | ORAL | Status: DC
  Administered 2024-01-26: 300 mg via ORAL
  Filled 2024-01-26: qty 3

## 2024-01-26 MED ORDER — MIDODRINE HCL 5 MG PO TABS
10.0000 mg | ORAL_TABLET | Freq: Three times a day (TID) | ORAL | Status: DC
  Administered 2024-01-26 – 2024-01-27 (×4): 10 mg via ORAL
  Filled 2024-01-26 (×4): qty 2

## 2024-01-26 MED ORDER — ESCITALOPRAM OXALATE 10 MG PO TABS
20.0000 mg | ORAL_TABLET | Freq: Every day | ORAL | Status: DC
  Administered 2024-01-26 – 2024-01-27 (×2): 20 mg via ORAL
  Filled 2024-01-26 (×2): qty 2

## 2024-01-26 MED ORDER — TRIMETHOBENZAMIDE HCL 100 MG/ML IM SOLN
200.0000 mg | Freq: Four times a day (QID) | INTRAMUSCULAR | Status: DC | PRN
  Administered 2024-01-26: 200 mg via INTRAMUSCULAR
  Filled 2024-01-26 (×2): qty 2

## 2024-01-26 MED ORDER — LORATADINE 10 MG PO TABS
10.0000 mg | ORAL_TABLET | Freq: Every day | ORAL | Status: DC
  Administered 2024-01-26 – 2024-01-27 (×2): 10 mg via ORAL
  Filled 2024-01-26 (×2): qty 1

## 2024-01-26 MED ORDER — LURASIDONE HCL 40 MG PO TABS
120.0000 mg | ORAL_TABLET | Freq: Every day | ORAL | Status: DC
  Administered 2024-01-26: 120 mg via ORAL
  Filled 2024-01-26: qty 3

## 2024-01-26 MED ORDER — OXYCODONE HCL 5 MG PO TABS
5.0000 mg | ORAL_TABLET | Freq: Four times a day (QID) | ORAL | Status: DC | PRN
  Administered 2024-01-26 (×2): 5 mg via ORAL
  Filled 2024-01-26 (×2): qty 1

## 2024-01-26 MED ORDER — ALBUTEROL SULFATE (2.5 MG/3ML) 0.083% IN NEBU: 2.5000 mg | INHALATION_SOLUTION | Freq: Four times a day (QID) | RESPIRATORY_TRACT | Status: DC | PRN

## 2024-01-26 MED ORDER — PREDNISONE 20 MG PO TABS
40.0000 mg | ORAL_TABLET | Freq: Every day | ORAL | Status: DC
  Administered 2024-01-27: 40 mg via ORAL
  Filled 2024-01-26 (×2): qty 2

## 2024-01-26 MED ORDER — SODIUM CHLORIDE 0.9 % IV SOLN: INTRAVENOUS | Status: DC

## 2024-01-26 MED ORDER — ONABOTULINUMTOXINA 200 UNITS IJ SOLR
INTRAMUSCULAR | 4 refills | Status: DC
  Filled 2024-01-26 – 2024-02-01 (×2): qty 1, 90d supply, fill #0
  Filled 2024-05-14: qty 1, 90d supply, fill #1

## 2024-01-26 MED ORDER — PANTOPRAZOLE SODIUM 40 MG PO TBEC
40.0000 mg | DELAYED_RELEASE_TABLET | Freq: Every day | ORAL | Status: DC
  Administered 2024-01-26 – 2024-01-27 (×2): 40 mg via ORAL
  Filled 2024-01-26 (×2): qty 1

## 2024-01-26 MED ORDER — ATORVASTATIN CALCIUM 80 MG PO TABS
80.0000 mg | ORAL_TABLET | Freq: Every day | ORAL | Status: DC
  Administered 2024-01-26 – 2024-01-27 (×2): 80 mg via ORAL
  Filled 2024-01-26 (×2): qty 1

## 2024-01-26 MED ORDER — INSULIN ASPART 100 UNIT/ML IJ SOLN: 0.0000 [IU] | Freq: Every day | INTRAMUSCULAR | Status: DC

## 2024-01-26 MED ORDER — ADULT MULTIVITAMIN W/MINERALS CH
1.0000 | ORAL_TABLET | Freq: Every day | ORAL | Status: DC
  Administered 2024-01-26 – 2024-01-27 (×2): 1 via ORAL
  Filled 2024-01-26 (×2): qty 1

## 2024-01-26 NOTE — Evaluation (Signed)
 Occupational Therapy Evaluation Patient Details Name: Paula Stout MRN: [id number] DOB: 08/22/1956 Today's Date: 01/26/2024   History of Present Illness   68 y.o. female admitted 01/25/24 after multiple falls and syncope for 4 days. PMhx: PAF on Eliquis , PSVT s/p ablation, CKD, T2DM, HLD, GERD, asthma, depression, anxiety, bipolar disorder, PTSD, IBS, orthostatic hypotension.     Clinical Impressions PTA, pt lives with family and typically Independent with ADLs, IADLs and mobility without AD. Pt endorses recent falls d/t syncopal episodes. Pt presents now with minor deficits in balance and endurance in setting of dizziness with activity. Pt requires overall Supervision for ADLs and CGA for safety with mobility d/t dizziness. Discussed fall prevention strategies at home and consistent BP & BG monitoring d/t noted recent issues. Anticipate no OT needs once dizziness resolved. Will continue to follow acutely.  BP supine: 127/68, 73bpm BP sitting EOB: 110/79, 77bpm BP standing: 92/78, 88bpm BP post bathroom mobility: 108/76, 82bpm Dizziness endorsed with all transitional movement     If plan is discharge home, recommend the following:   Assistance with cooking/housework;Help with stairs or ramp for entrance     Functional Status Assessment   Patient has had a recent decline in their functional status and demonstrates the ability to make significant improvements in function in a reasonable and predictable amount of time.     Equipment Recommendations   None recommended by OT     Recommendations for Other Services         Precautions/Restrictions   Precautions Precautions: Fall;Other (comment) Recall of Precautions/Restrictions: Intact Precaution/Restrictions Comments: monitor orthostatics Restrictions Weight Bearing Restrictions Per Provider Order: No     Mobility Bed Mobility Overal bed mobility: Modified Independent                   Transfers Overall transfer level: Needs assistance Equipment used: None Transfers: Sit to/from Stand Sit to Stand: Supervision           General transfer comment: from bedside and toilet without physical assist      Balance Overall balance assessment: Mild deficits observed, not formally tested                                         ADL either performed or assessed with clinical judgement   ADL Overall ADL's : Needs assistance/impaired Eating/Feeding: Independent   Grooming: Standing;Supervision/safety   Upper Body Bathing: Set up;Sitting   Lower Body Bathing: Sitting/lateral leans;Sit to/from stand;Supervison/ safety   Upper Body Dressing : Set up;Sitting   Lower Body Dressing: Contact guard assist;Sit to/from stand Lower Body Dressing Details (indicate cue type and reason): pt able to don socks without  issues EOB, supervision for safety of LB ADLs in standing Toilet Transfer: Contact guard assist;Ambulation Toilet Transfer Details (indicate cue type and reason): for safety with mobility, some reaching out to furniture noted. Toileting- Clothing Manipulation and Hygiene: Supervision/safety;Sitting/lateral lean;Sit to/from stand       Functional mobility during ADLs: Contact guard assist General ADL Comments: reports of dizziness with all transitional movements and progressive with bathroom mobility. CGA for safety. Discussed fall prevention, monitoring of BP and BG at home (has an app for BG monitoring).     Vision Baseline Vision/History: 1 Wears glasses Ability to See in Adequate Light: 0 Adequate Patient Visual Report: No change from baseline Vision Assessment?: No apparent visual deficits     Perception  Praxis         Pertinent Vitals/Pain Pain Assessment Pain Assessment: No/denies pain     Extremity/Trunk Assessment Upper Extremity Assessment Upper Extremity Assessment: Overall WFL for tasks assessed;Right hand  dominant   Lower Extremity Assessment Lower Extremity Assessment: Defer to PT evaluation   Cervical / Trunk Assessment Cervical / Trunk Assessment: Normal   Communication Communication Communication: No apparent difficulties   Cognition Arousal: Alert Behavior During Therapy: WFL for tasks assessed/performed Cognition: No apparent impairments                               Following commands: Intact       Cueing  General Comments   Cueing Techniques: Verbal cues      Exercises     Shoulder Instructions      Home Living Family/patient expects to be discharged to:: Private residence Living Arrangements: Children;Other relatives (daughter and son in law, 17 y/o granddaughter) Available Help at Discharge: Family;Available 24 hours/day Type of Home: House Home Access: Level entry     Home Layout: Two level;Bed/bath upstairs Alternate Level Stairs-Number of Steps: flight Alternate Level Stairs-Rails: Right;Left Bathroom Shower/Tub: Chief Strategy Officer: Standard     Home Equipment: Shower seat          Prior Functioning/Environment Prior Level of Function : Independent/Modified Independent;History of Falls (last six months)             Mobility Comments: no AD typically, recent falls d/t syncopal episodes ADLs Comments: Indep with ADLs, IADLs. cares for her small dog, Julio    OT Problem List: Impaired balance (sitting and/or standing);Decreased activity tolerance;Decreased knowledge of use of DME or AE   OT Treatment/Interventions: Self-care/ADL training;Therapeutic exercise;Energy conservation;DME and/or AE instruction;Therapeutic activities;Patient/family education;Balance training      OT Goals(Current goals can be found in the care plan section)   Acute Rehab OT Goals Patient Stated Goal: resolve dizziness issues OT Goal Formulation: With patient Time For Goal Achievement: 02/09/24 Potential to Achieve Goals: Good ADL  Goals Additional ADL Goal #1: Pt to complete all ADLs Independently without LOB or safety concerns Additional ADL Goal #2: Pt to verbalize/implement at least 2 strategies during ADLs/mobility to decrease fall risk if dizziness occurs.   OT Frequency:  Min 2X/week    Co-evaluation              AM-PAC OT "6 Clicks" Daily Activity     Outcome Measure Help from another person eating meals?: None Help from another person taking care of personal grooming?: A Little Help from another person toileting, which includes using toliet, bedpan, or urinal?: A Little Help from another person bathing (including washing, rinsing, drying)?: A Little Help from another person to put on and taking off regular upper body clothing?: A Little Help from another person to put on and taking off regular lower body clothing?: A Little 6 Click Score: 19   End of Session Equipment Utilized During Treatment: Gait belt Nurse Communication: Mobility status  Activity Tolerance: Patient tolerated treatment well Patient left: in bed;with call bell/phone within reach;with bed alarm set  OT Visit Diagnosis: Unsteadiness on feet (R26.81);Dizziness and giddiness (R42);History of falling (Z91.81)                Time: 8295-6213 OT Time Calculation (min): 25 min Charges:  OT General Charges $OT Visit: 1 Visit OT Evaluation $OT Eval Low Complexity: 1 Low  Lawrence Pretty,  OTR/L Acute Rehab Services Office: [phone]   Paula Stout 01/26/2024, 7:59 AM

## 2024-01-26 NOTE — Progress Notes (Signed)
 [facility] Counselor/Therapist Progress Note  Patient ID: Karimah Winquist, MRN: [id number],    Date: 01/26/2024 Time Spent: , 3 PM until 3:40 PM. The patient consented to the video teletherapy and was located in her home during this session. She is aware it is the responsibility of the patient to secure confidentiality on her end of the session. The provider was in a private home  for the duration of this session.      Treatment Type: Individual Therapy  Reported Symptoms: Anxiety, depression  Mental Status Exam: Appearance:  Casual     Behavior: Appropriate  Motor: Normal  Speech/Language:  Normal Rate  Affect: Appropriate  Mood: normal  Thought process: normal  Thought content:   WNL  Sensory/Perceptual disturbances:   WNL  Orientation: oriented to person, place, time/date, situation, day of week, month of year, and year  Attention: Good  Concentration: Good  Memory: WNL  Fund of knowledge:  Good  Insight:   Good  Judgment:  Good  Impulse Control: Good   Risk Assessment: Danger to Self:  No Self-injurious Behavior: No Danger to Others: No Duty to Warn:no Physical Aggression / Violence:No  Access to Firearms a concern: No  Gang Involvement:No   Subjective: The patient was in the hospital for this visit.  She indicated that on Sunday night of this week she passed out and fell 4 separate times between early in the morning until later in the morning.  She called Monday and her primary care doctor was out but they called her back on Tuesday and he worked her in that day.  She fell again on Tuesday.  Almost immediately after meeting with her doctor on Monday he sent her to the emergency room which is downstairs in the building that he is located in.  After they checked her out they decided she needed to be hospitalized in the bigger facility which is where she is now.  She said that while in the smaller emergency room she told him she did not feel confident  enough to stand because she had been so dizzy.  Since being in the main hospital they have run blood work and done complete scans.  Skains at least once her back so far have not shown anything but they are waiting on some more answers.  There were varying opinions some thinking that her blood pressure medicine can her weight loss medicine might be contributing to some of the episodes.  They have take her off of 2 medications to see how she does over the next few days.  She has had a migraine so they are treating the migraine as well as getting her blood pressure back up because it became extremely low over the past several days.  She knows that her cardiologist is going to run some additional test but that will not be for another week or so.  Her anxiety level has been up but she feels that she is managing as well as she can in the situation that she is in.  When her primary care doctor wanted her to come in on Tuesday after passing out 4 or 5 times she asked her daughter to drive her but her daughter's response was that she had a headache and could not take her.  She said she was woozy driving to her doctor's office visit but made it.  When she found that she was going to be transferred to the bigger Hospital she said she would pay for a new  word or take her daughter to the doctor's office so the daughter could take her car home and the daughter would not do it.  She has spoken to her other children who live in [place] and they are aware of the situation and very frustrated with the patient's daughter that she lives with.  The patient already has plans to go out there in the middle of July and they are looking to see if anything can be done sooner but know that patient has some doctors visits here.  They are checking on her daily.  Her primary care physician asked why the patient did not call the ambulance to take her to the doctor and she honestly told him that it was she was embarrassed by the  condition of the home environment that she lives in and her daughter and son-in-law's home.  I had expressed the same concerns multiple times for the patient and conditions of the home have been described in previous notes.  The patient is unsure as to whether she will be discharged tomorrow or not.  They are still running some tests and awaiting some other test results.  She also still has a migraine.  I encouraged her to stay as long as they would allow her to so she could get some rest and be taking care of and watched more closely.  She did say that she feels like her daughter and son-in-law will take care of her dog for her.  The patient does contract for safety having no thoughts of hurting herself or anyone else. She does contract for safety having no thoughts of hurting herself or anyone else. Diagnosis: Generalized anxiety disorder, major depressive disorder, moderate, recurrent Plan: I will meet with the patient weekly via care agility.  Treatment plan: We will use cognitive behavioral therapy as well as person centered and supportive therapy in addition to elements of dialectical behavior therapy to help reduce the patient's anxiety and depression by at least 50% with a target date of October 06, 2023.  Goals for improving depression may include having less sadness as indicated by patient report and scores on the PHQ-9, have improved mood and return to a healthier level of functioning, identify causes including environment for depressed mood and learn ways to cope with depression especially those connected to her medical issues.  Interventions include using cognitive behavioral therapy to explore and replace thoughts and behaviors.  We will look at how depression is experienced in day-to-day living and encouraged sharing of feelings.  We will encourage the use of coping skills for management of depressive symptoms.  Goals for reducing anxiety are to improve her ability to manage anxiety symptoms,  better handle stress, identify causes for anxiety and explore ways to lower it, resolve the core conflicts contributing to anxiety as well as manage thoughts and worrisome thinking contributing to feelings of anxiety.  Interventions will include providing education about anxiety, facilitate problem solution skills to help her identify options for resolving stress, teach coping skills for managing anxiety as well as mindfulness and communication in terms of family to reduce her stress level, use cognitive behavior therapy to identify and change anxiety provoking thought and behavior patterns as well as teach distress tolerance and mindfulness skills for alleviating anxiety. Progress: 30% progress notes were reviewed with a new extended target date of April 04, 2024.  Cecile Coder, [facility]  Cecile Coder, [facility]               Cecile Coder, [facility]               Cecile Coder, [facility]

## 2024-01-26 NOTE — Evaluation (Signed)
 Physical Therapy Evaluation Patient Details Name: Paula Stout MRN: [id number] DOB: 1955-11-07 Today's Date: 01/26/2024  History of Present Illness  68 y.o. female admitted 01/25/24 after multiple falls and syncope. PMhx: PAF on Eliquis , PSVT s/p ablation, CKD, T2DM, HLD, GERD, asthma, depression, anxiety, bipolar disorder, PTSD, IBS, orthostatic hypotension.  Clinical Impression  Pt pleasant and reports episodes of fall and dizziness since January with lots of medication adjustments. Pt without symptoms of vertigo this session. HR 75-95 with limited mobility in room and no overt LOB with pt symptoms of nausea and slight dizziness limiting mobility. Pt able to move in room without AD but preference for UB support at times. Pt with decreased activity tolerance, gait and balance who will benefit from acute therapy to maximize independence and balance.          If plan is discharge home, recommend the following: A little help with walking and/or transfers;Assistance with cooking/housework;Help with stairs or ramp for entrance   Can travel by private vehicle        Equipment Recommendations None recommended by PT  Recommendations for Other Services       Functional Status Assessment Patient has had a recent decline in their functional status and demonstrates the ability to make significant improvements in function in a reasonable and predictable amount of time.     Precautions / Restrictions Precautions Precautions: Fall Recall of Precautions/Restrictions: Intact      Mobility  Bed Mobility Overal bed mobility: Modified Independent                  Transfers Overall transfer level: Needs assistance   Transfers: Sit to/from Stand Sit to Stand: Contact guard assist           General transfer comment: CGa for safety with repeated falls    Ambulation/Gait Ambulation/Gait assistance: Contact guard assist Gait Distance (Feet): 40 Feet Assistive device: None Gait  Pattern/deviations: Step-through pattern, Decreased stride length   Gait velocity interpretation: 1.31 - 2.62 ft/sec, indicative of limited community ambulator   General Gait Details: pt reports initial nausea with mobility then limited by fatigue and HA without report of dizziness. Pt reaching for environmental support x 3 in standing  Stairs            Wheelchair Mobility     Tilt Bed    Modified Rankin (Stroke Patients Only)       Balance Overall balance assessment: Mild deficits observed, not formally tested                                           Pertinent Vitals/Pain Pain Assessment Pain Assessment: 0-10 Pain Score: 5  Pain Location: HA Pain Descriptors / Indicators: Aching Pain Intervention(s): Limited activity within patient's tolerance, Monitored during session, Repositioned    Home Living Family/patient expects to be discharged to:: Private residence Living Arrangements: Children;Other relatives Available Help at Discharge: Family;Available 24 hours/day Type of Home: House Home Access: Level entry     Alternate Level Stairs-Number of Steps: flight Home Layout: Two level;Bed/bath upstairs Home Equipment: Shower seat      Prior Function Prior Level of Function : Independent/Modified Independent;History of Falls (last six months)             Mobility Comments: no AD typically, recent falls d/t syncopal episodes ADLs Comments: Indep with ADLs, IADLs. cares for her small dog, Julio. 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     Extremity/Trunk Assessment   Upper Extremity Assessment Upper Extremity Assessment: Overall WFL for tasks assessed    Lower Extremity Assessment Lower Extremity Assessment: Overall WFL for tasks assessed    Cervical / Trunk Assessment Cervical / Trunk Assessment: Normal  Communication   Communication Communication: No apparent difficulties    Cognition Arousal:  Alert Behavior During Therapy: WFL for tasks assessed/performed   PT - Cognitive impairments: No apparent impairments                         Following commands: Intact       Cueing Cueing Techniques: Verbal cues     General Comments      Exercises     Assessment/Plan    PT Assessment Patient needs continued PT services  PT Problem List Decreased activity tolerance;Decreased balance;Decreased mobility       PT Treatment Interventions Gait training;Stair training;Functional mobility training;Therapeutic activities;Patient/family education;Balance training;DME instruction    PT Goals (Current goals can be found in the Care Plan section)  Acute Rehab PT Goals Patient Stated Goal: return home and to sewing PT Goal Formulation: With patient Time For Goal Achievement: 02/09/24 Potential to Achieve Goals: Good    Frequency Min 2X/week     Co-evaluation               AM-PAC PT "6 Clicks" Mobility  Outcome Measure Help needed turning from your back to your side while in a flat bed without using bedrails?: None Help needed moving from lying on your back to sitting on the side of a flat bed without using bedrails?: None Help needed moving to and from a bed to a chair (including a wheelchair)?: A Little Help needed standing up from a chair using your arms (e.g., wheelchair or bedside chair)?: A Little Help needed to walk in hospital room?: A Little Help needed climbing 3-5 steps with a railing? : A Lot 6 Click Score: 19    End of Session Equipment Utilized During Treatment: Gait belt Activity Tolerance: Patient tolerated treatment well Patient left: in chair;with call bell/phone within reach;with chair alarm set;with nursing/sitter in room Nurse Communication: Mobility status PT Visit Diagnosis: Other abnormalities of gait and mobility (R26.89);Difficulty in walking, not elsewhere classified (R26.2)    Time: 4034-7425 PT Time Calculation (min) (ACUTE  ONLY): 15 min   Charges:   PT Evaluation $PT Eval Low Complexity: 1 Low   PT General Charges $$ ACUTE PT VISIT: 1 Visit         Annis Baseman, PT Acute Rehabilitation Services Office: [phone]   Jackey Mary Anesha Hackert 01/26/2024, 11:06 AM

## 2024-01-26 NOTE — Progress Notes (Signed)
 Patient was seen and examined.  Admitted early morning hours by nighttime hospitalist.  H&P and assessment plan reviewed.  In brief, 68 year old with history of paroxysmal A-fib on Eliquis , PSVT status post ablation, CKD stage IV, type 2 diabetes, hypertension hyperlipidemia, depression anxiety bipolar disorder PTSD, migraines on extensive medications suffering from multiple episodes of orthostatic hypotension, dizziness lightheadedness and fall at home.  Recently started on midodrine  5 mg 3 times daily.  Patient on Mounjaro  and lost about 50 pounds in last 6 months.  Was seen at primary care office yesterday with complaint of fall so sent to the ER.  In the emergency room hemodynamically stable.  Positive orthostatics.  Skeletal survey negative.  Symptomatic orthostatic hypotension due to polypharmacy: Metoprolol  and Cardizem  discontinued.  She is currently in sinus rhythm.  She is on multiple medications that can potentially cause orthostatics, however patient is unable to come off these medications.  After overnight hydration her symptoms are fairly controlled. Midodrine  10 mg 3 times daily starting today morning, frequent mobilization orthostatic vitals. Compression socks to wear all the time. Encourage oral nutrition and fluid intake. Anticipate home tomorrow.  Total time spent: 35 minutes.  Same-day admit.  No charge visit.

## 2024-01-26 NOTE — Progress Notes (Signed)
 X-ray abdomen no evidence of bowel obstruction. Starting heart healthy carb modified diet and sliding scale insulin  coverage.Aaron Aas  Jaymz Traywick, MD [facility] 01/26/2024, 4:45 AM

## 2024-01-26 NOTE — H&P (Signed)
 History and Physical    Paula Stout WUJ:[id number] DOB: 21-Nov-1955 DOA: 01/25/2024  PCP: Jobe Mulder, DO  Patient coming from: [facility] ED  Chief Complaint: Syncope  HPI: Paula Stout is a 68 y.o. female with medical history significant of paroxysmal A-fib on Eliquis , PSVT status post ablation, CKD stage IV, type 2 diabetes, hyperlipidemia, GERD, asthma, depression, anxiety, bipolar disorder, PTSD, IBS, migraine headaches, hospital admission in January 2025 at outside facility [facility]) for syncope secondary to orthostatic hypotension.  Dose of her home medications metoprolol  and Cardizem  was reduced.  Echo done during this admission showed EF 60 to 65%, trace aortic regurgitation, moderate tricuspid regurgitation, small pericardial effusion, and mild ascending aorta dilation.  Patient was seen by her PCP yesterday due to multiple falls x 4 days, syncope, and hypotension.  Dose of her home midodrine  was increased from 5 TID to 10 mg TID.  Mounjaro  was stopped due to concern for hypoglycemia.  She was sent to the ED for further evaluation.  Blood pressure 96/72 on arrival to the ED.  Not tachycardic or hypoxic.  EKG showing sinus rhythm and no acute changes.  Labs notable for hemoglobin 11.6 (stable), MCV 93.9, bicarb 19, glucose 80, creatinine 1.9 (stable), initial troponin negative and repeat pending.  UA with positive nitrite, trace leukocytes, and microscopy showing 6-10 WBCs and few bacteria.  Urine culture pending.  CT head/C-spine negative for acute findings.  CT chest/T-spine without contrast negative for acute findings.  Showing unchanged irregular nodule of the dependent superior segment of the right lower lobe measuring 1.6 x 1.1 cm, previously biopsied.  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 Patient was given ceftriaxone, Tylenol , and 1 L normal saline bolus in the  ED.  Patient states 3 days ago she got up early in the morning to go to the bathroom and felt dizzy.  Upon reaching the bathroom she passed out and fell.  This happened 3 more times over the next few hours where every time she got up to go to the bathroom, she felt dizzy and passed out.  She reports hitting her head twice and reports hitting her upper back against the toilet.  Then yesterday as she was getting ready to feed her dog, she felt dizzy so she sat down.  Her daughter told her that she had briefly lost consciousness.  No seizure-like activity reported.  Patient states the dose of her home metoprolol  and Cardizem  were reduced back in January due to hospital admission for syncope in [place] .  She was started on midodrine  by her cardiologist over a month ago due to low blood pressure.  States most of her home diabetes medications were stopped back in March as her A1c was 4.7 and she was continued on Mounjaro .  She has a CGM and her blood sugars have been in the 70s at home and as low as 60s yesterday so her PCP has now stopped the Mounjaro  as well.  She reports history of chronic exertional chest pain and dyspnea in the setting of history of SVT requiring ablation multiple times last year.  Patient states her cardiologist has scheduled her for a loop recorder on June 9.  She reports vomiting after eating dinner for several months, symptoms worse for the past few days.  Reports chronic constipation and normally has a bowel movement every 2 days.  She did have a bowel movement yesterday  but remains very constipated.  No abdominal pain or distention.  Reports urinary frequency and urgency.  Denies dysuria, flank pain, or fevers.  Review of Systems:  Review of Systems  All other systems reviewed and are negative.   Past Medical History:  Diagnosis Date   Adhesive capsulitis of left shoulder 12/16/2016   Asthma without status asthmaticus 02/06/2021   At risk for falls 02/04/2019   Atrial  fibrillation (HCC) 09/01/2014   Last Assessment & Plan:  Formatting of this note might be different from the original. A:  Chronic.  Sinus rhythm at this time.  States she was told this during admission at [facility] in the past. P:  On baby aspirin  at home will continue.  Low dose metoprolol  started.   Bipolar 1 disorder, depressed, moderate (HCC) 02/18/2020   Capsulitis of left shoulder 10/20/2021   Chronic anticoagulation 02/20/2020   Chronic atrial fibrillation (HCC) 02/18/2020   Chronic headache 10/11/2013   Chronic interstitial cystitis 12/28/2005   Chronic migraine without aura, intractable, without status migrainosus 06/24/2017   Colon cancer screening 04/15/2016   Last Assessment & Plan:  Formatting of this note might be different from the original. Will schedule for colonoscopy   Depression, recurrent (HCC) 02/18/2020   Diabetes mellitus (HCC)    Dyslipidemia 02/20/2020   GAD (generalized anxiety disorder) 02/18/2020   GERD (gastroesophageal reflux disease) 11/05/2021   H/O chest pain 07/31/2021   History of atrial fibrillation 06/24/2017   History of posttraumatic stress disorder (PTSD) 10/07/2016   Hyperosmolarity due to secondary diabetes mellitus (HCC) 02/06/2021   Impaired mobility and activities of daily living 11/14/2015   Irritable bowel syndrome (IBS)    Junctional tachycardia (HCC) 11/05/2021   Kidney stone 02/06/2021   Migraine without aura, not refractory 02/06/2021   Mixed hyperlipidemia 04/06/2021   Last Assessment & Plan:  Formatting of this note might be different from the original. 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 Controlled    Refractory migraine with aura 02/06/2021   Refractory migraine without aura 02/06/2021   Renal colic on left side 04/22/2017   Stage 3 chronic kidney disease (HCC) 02/20/2020   Thyroid  goiter    10/17/23 CT: right thyorid goiter with substernal extension, benign FNA 07/07/23   Unspecified adverse effect of other drug, medicinal and biological substance(995.29) 02/17/2006   Urinary tract obstruction 02/06/2021    Past Surgical History:  Procedure Laterality Date   ABDOMINAL HYSTERECTOMY     Fibroids   ATRIAL FIBRILLATION ABLATION     ATRIAL FIBRILLATION ABLATION N/A 10/27/2022   Procedure: ATRIAL FIBRILLATION ABLATION;  Surgeon: Lei Pump, MD;  Location: [facility] INVASIVE CV LAB;  Service: Cardiovascular;  Laterality: N/A;   BRONCHIAL BIOPSY  11/08/2023   Procedure: BRONCHOSCOPY, WITH BIOPSY;  Surgeon: Denson Flake, MD;  Location: [facility] ENDOSCOPY;  Service: Pulmonary;;   BRONCHIAL BRUSHINGS  11/08/2023   Procedure: BRONCHOSCOPY, WITH BRUSH BIOPSY;  Surgeon: Denson Flake, MD;  Location: [facility] ENDOSCOPY;  Service: Pulmonary;;   BRONCHIAL NEEDLE ASPIRATION BIOPSY  11/08/2023   Procedure: BRONCHOSCOPY, WITH NEEDLE ASPIRATION BIOPSY;  Surgeon: Denson Flake, MD;  Location: [facility] ENDOSCOPY;  Service: Pulmonary;;   BRONCHIAL WASHINGS  11/08/2023  Procedure: IRRIGATION, BRONCHUS;  Surgeon: Denson Flake, MD;  Location: [facility] ENDOSCOPY;  Service: Pulmonary;;   CARDIAC CATHETERIZATION     CARDIOVERSION     CESAREAN SECTION     KNEE SURGERY     LEFT HEART CATH AND CORONARY ANGIOGRAPHY N/A 03/04/2020   Procedure: LEFT HEART CATH AND CORONARY ANGIOGRAPHY;  Surgeon: Arty Binning, MD;  Location: [facility] INVASIVE CV LAB;  Service: Cardiovascular;  Laterality: N/A;   ROTATOR CUFF REPAIR     SVT ABLATION N/A 05/04/2023   Procedure: SVT ABLATION;  Surgeon: Lei Pump, MD;  Location: [facility] INVASIVE CV LAB;  Service: Cardiovascular;  Laterality: N/A;     reports that she has never smoked. She has been  exposed to tobacco smoke. She has never used smokeless tobacco. She reports that she does not drink alcohol and does not use drugs.  Allergies  Allergen Reactions   Bee Venom Anaphylaxis   Onion Other (See Comments)    Respiratory Distress   Tetanus-Diphtheria Toxoids Td Anaphylaxis   Sumatriptan     Passed out, nose bleed    Zolpidem  Other (See Comments)    Causes sleep walking   Asa [Aspirin ] Nausea And Vomiting   Iodine Rash   Nsaids Nausea Only    Rash, hives and trouble breathing   Sulfa Antibiotics Rash   Vancomycin Rash    Other Reaction(s): STEVENS-JOHNSON    Family History  Adopted: Yes  Problem Relation Age of Onset   Migraines Daughter    Bipolar disorder Daughter    Migraines Daughter    Migraines Son    Bipolar disorder Son     Prior to Admission medications   Medication Sig Start Date End Date Taking? Authorizing Provider  acetaminophen  (TYLENOL ) 500 MG tablet Take 1,000 mg by mouth every 6 (six) hours as needed for mild pain.    [provider]  apixaban  (ELIQUIS ) 5 MG TABS tablet Take 1 tablet (5 mg total) by mouth 2 (two) times daily. 01/18/24   Jobe Mulder, DO  atorvastatin  (LIPITOR ) 80 MG tablet TAKE 1 TABLET DAILY 01/03/24   Jobe Mulder, DO  Baclofen  5 MG TABS Take 1 tablet (5 mg total) by mouth 2 (two) times daily as needed. 01/23/24   Merriam Abbey, DO  blood glucose meter kit and supplies One Touch Ultra  Check blood sugars once daily Dx: E11.9 Patient taking differently: 1 each by Other route See admin instructions. One Touch Ultra  Check blood sugars once daily Dx: E11.9 10/01/21   Jobe Mulder, DO  budesonide -formoterol  (SYMBICORT ) 80-4.5 MCG/ACT inhaler Inhale 2 puffs into the lungs 2 (two) times daily. 08/11/23   Cobb, Mariah Shines, NP  buPROPion  (WELLBUTRIN  XL) 300 MG 24 hr tablet Take 1 tablet (300 mg total) by mouth daily. 10/19/23   Lincoln Renshaw, NP  Continuous Glucose Sensor (DEXCOM G7 SENSOR) MISC Check  blood sugars as directed 01/19/24   Jobe Mulder, DO  diltiazem  (CARDIZEM  CD) 120 MG 24 hr capsule Take 120 mg by mouth daily. 12/08/23   [provider]  EPINEPHrine  0.3 mg/0.3 mL IJ SOAJ injection Inject 0.3 mg into the muscle as needed for anaphylaxis. Patient not taking: Reported on 01/23/2024 12/09/23   Jobe Mulder, DO  escitalopram  (LEXAPRO ) 20 MG tablet Take 1 tablet (20 mg total) by mouth daily. 12/09/23   Lincoln Renshaw, NP  ezetimibe  (ZETIA ) 10 MG tablet TAKE 1 TABLET DAILY 12/26/23   Jobe Mulder, DO  FARXIGA  10 MG TABS tablet TAKE 1 TABLET DAILY 08/09/23   Jobe Mulder, DO  fenofibrate  160 MG tablet Take 1 tablet (160 mg total) by mouth daily. 09/21/23   Jobe Mulder, DO  fluticasone  (FLONASE ) 50 MCG/ACT nasal spray Place 2 sprays into both nostrils daily. 01/29/23   Yevette Hem, FNP  Fremanezumab -vfrm (AJOVY ) 225 MG/1.5ML SOAJ Inject 225 mg into the skin every 30 (thirty) days. 11/28/23   Merriam Abbey, DO  levocetirizine (XYZAL ) 5 MG tablet TAKE 1 TABLET EVERY EVENING 12/01/23   Wendling, Shellie Dials, DO  LORazepam  (ATIVAN ) 0.5 MG tablet Take 1 tablet (0.5 mg total) by mouth every 8 (eight) hours. 10/19/23   Lincoln Renshaw, NP  Lurasidone  HCl 120 MG TABS Take 1 tablet (120 mg total) by mouth at bedtime. 10/19/23   Lincoln Renshaw, NP  magnesium  oxide (MAG-OX) 400 (240 Mg) MG tablet Take 400 mg by mouth daily.    [provider]  metoprolol  tartrate (LOPRESSOR ) 25 MG tablet Take 1 tablet (25 mg total) by mouth every 4 (four) hours as needed (PALPITATIONS). 08/24/22   Camnitz, Babetta Lesch, MD  midodrine  (PROAMATINE ) 10 MG tablet Take 1 tablet (10 mg total) by mouth 3 (three) times daily. 01/25/24   Jobe Mulder, DO  Multiple Vitamins-Minerals (MULTIVITAMIN WITH MINERALS) tablet Take 1 tablet by mouth daily. Daily Diabetes Health Pack - MVI + chromium, fish oil + vit D, magnesium , vit C, alpha lipoic acid w/ green  tea    [provider]  nitroGLYCERIN  (NITROSTAT ) 0.4 MG SL tablet Place 1 tablet (0.4 mg total) under the tongue every 5 (five) minutes as needed for chest pain. Patient not taking: Reported on 01/23/2024 03/16/23   Krasowski, Robert J, MD  ondansetron  (ZOFRAN ) 4 MG tablet Take 1 tablet (4 mg total) by mouth every 6 (six) hours. 01/09/24   Felicie Horning, PA-C  ONETOUCH VERIO test strip USE DAILY TO CHECK BLOOD SUGAR Patient taking differently: 1 each by Other route See admin instructions. Use daily to check blood sugar. 10/29/22   Jobe Mulder, DO  pantoprazole  (PROTONIX ) 40 MG tablet TAKE 1 TABLET DAILY 12/26/23   Jobe Mulder, DO  predniSONE  (DELTASONE ) 20 MG tablet Take 2 tablets (40 mg total) by mouth daily with breakfast for 5 days. 01/25/24 01/30/24  Jobe Mulder, DO  QUEtiapine  (SEROQUEL ) 300 MG tablet Take 1 tablet (300 mg total) by mouth at bedtime. 10/19/23   Lincoln Renshaw, NP  Rimegepant Sulfate (NURTEC) 75 MG TBDP Take 1 tablet (75 mg total) by mouth as needed (take 1 tab at the earlist onset of a migraine. Max 1 tab in 24 hours). 04/04/23   Merriam Abbey, DO  topiramate  (TOPAMAX ) 50 MG tablet TAKE 3 TABLETS TWICE A DAY 08/23/23   Jobe Mulder, DO    Physical Exam: Vitals:   01/25/24 1930 01/25/24 2143 01/25/24 2230 01/25/24 2337  BP: (!) 144/72  137/70 (!) 158/81  Pulse: 72  73 71  Resp: 20  20 18   Temp:  98.4 F (36.9 C)  (!) 97.4 F (36.3 C)  TempSrc:  Oral    SpO2: 100%  98% 95%  Weight:        Physical Exam Vitals reviewed.  Constitutional:      General: She is not in acute distress. HENT:     Head: Normocephalic and atraumatic.  Eyes:     Extraocular Movements: Extraocular movements intact.  Cardiovascular:  Rate and Rhythm: Normal rate and regular rhythm.     Pulses: Normal pulses.  Pulmonary:     Effort: Pulmonary effort is normal. No respiratory distress.     Breath sounds: Normal breath sounds. No  wheezing or rales.  Abdominal:     General: Bowel sounds are normal. There is no distension.     Palpations: Abdomen is soft.     Tenderness: There is no abdominal tenderness. There is no guarding.  Musculoskeletal:     Cervical back: Normal range of motion.     Right lower leg: No edema.     Left lower leg: No edema.  Skin:    General: Skin is warm and dry.  Neurological:     General: No focal deficit present.     Mental Status: She is alert and oriented to person, place, and time.     Labs on Admission: I have personally reviewed following labs and imaging studies  CBC: Recent Labs  Lab 01/25/24 1845  WBC 6.3  NEUTROABS 3.6  HGB 11.6*  HCT 35.4*  MCV 93.9  PLT 304   Basic Metabolic Panel: Recent Labs  Lab 01/25/24 1845  NA 140  K 3.6  CL 107  CO2 19*  GLUCOSE 80  BUN 18  CREATININE 1.96*  CALCIUM  9.7   GFR: Estimated Creatinine Clearance: 28.7 mL/min (A) (by C-G formula based on SCr of 1.96 mg/dL (H)). Liver Function Tests: Recent Labs  Lab 01/25/24 1845  AST 28  ALT 15  ALKPHOS 61  BILITOT 0.6  PROT 6.4*  ALBUMIN 3.8   No results for input(s): "LIPASE", "AMYLASE" in the last 168 hours. No results for input(s): "AMMONIA" in the last 168 hours. Coagulation Profile: No results for input(s): "INR", "PROTIME" in the last 168 hours. Cardiac Enzymes: No results for input(s): "CKTOTAL", "CKMB", "CKMBINDEX", "TROPONINI" in the last 168 hours. BNP (last 3 results) No results for input(s): "PROBNP" in the last 8760 hours. HbA1C: No results for input(s): "HGBA1C" in the last 72 hours. CBG: Recent Labs  Lab 01/25/24 1741  GLUCAP 83   Lipid Profile: No results for input(s): "CHOL", "HDL", "LDLCALC", "TRIG", "CHOLHDL", "LDLDIRECT" in the last 72 hours. Thyroid  Function Tests: No results for input(s): "TSH", "T4TOTAL", "FREET4", "T3FREE", "THYROIDAB" in the last 72 hours. Anemia Panel: No results for input(s): "VITAMINB12", "FOLATE", "FERRITIN", "TIBC",  "IRON", "RETICCTPCT" in the last 72 hours. Urine analysis:    Component Value Date/Time   COLORURINE YELLOW 01/25/2024 1745   APPEARANCEUR HAZY (A) 01/25/2024 1745   LABSPEC >=1.030 01/25/2024 1745   PHURINE 5.5 01/25/2024 1745   GLUCOSEU >=500 (A) 01/25/2024 1745   HGBUR NEGATIVE 01/25/2024 1745   BILIRUBINUR NEGATIVE 01/25/2024 1745   KETONESUR NEGATIVE 01/25/2024 1745   PROTEINUR 30 (A) 01/25/2024 1745   NITRITE POSITIVE (A) 01/25/2024 1745   LEUKOCYTESUR TRACE (A) 01/25/2024 1745    Radiological Exams on Admission: CT Chest Wo Contrast Result Date: 01/25/2024 CLINICAL DATA:  Multiple recent falls, rib and back pain, left-sided ecchymosis, status post right lower lobe lung nodule biopsy * Tracking Code: BO * EXAM: CT CHEST WITHOUT CONTRAST CT THORACIC SPINE WITHOUT CONTRAST TECHNIQUE: Multidetector CT imaging of the chest was performed following the standard protocol without IV contrast. Multidetector CT imaging of the thoracic spine was performed following the standard protocol without IV contrast. RADIATION DOSE REDUCTION: This exam was performed according to the departmental dose-optimization program which includes automated exposure control, adjustment of the mA and/or kV according to patient size and/or  use of iterative reconstruction technique. COMPARISON:  10/17/2023 FINDINGS: CT CHEST FINDINGS Cardiovascular: Aortic atherosclerosis. Normal heart size. Trace pericardial effusion. Mediastinum/Nodes: No enlarged mediastinal, hilar, or axillary lymph nodes. Trachea and esophagus demonstrate no significant findings. Lungs/Pleura: Unchanged irregular nodule of the dependent superior segment right lower lobe measuring 1.6 x 1.1 cm (series 8, image 68). No pleural effusion or pneumothorax. Musculoskeletal: No chest wall mass or suspicious osseous lesions identified. Upper abdomen: No acute findings. 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 CT THORACIC SPINE FINDINGS Alignment: Normal thoracic kyphosis. Vertebral bodies: Intact. No fracture or dislocation. Disc spaces: Mild multilevel thoracic disc degenerative disease with bridging osteophytes throughout the mid to lower thoracic spine in keeping with DISH. Paraspinous soft tissues: Unremarkable. IMPRESSION: 1. No acute noncontrast CT findings of the chest or thoracic spine. 2. Specifically, no displaced rib fractures. 3. Unchanged irregular nodule of the dependent superior segment right lower lobe measuring 1.6 x 1.1 cm, previously biopsied. 4. 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 Aortic Atherosclerosis (ICD10-I70.0). Electronically Signed   By: Fredricka Jenny M.D.   On: 01/25/2024 19:50   CT T-SPINE NO CHARGE Result Date: 01/25/2024 CLINICAL DATA:  Multiple recent falls, rib and back pain, left-sided ecchymosis, status post right lower lobe lung nodule biopsy * Tracking Code: BO * EXAM: CT CHEST WITHOUT CONTRAST CT THORACIC SPINE WITHOUT CONTRAST TECHNIQUE: Multidetector CT imaging of the chest was performed following the standard protocol without IV contrast. Multidetector CT imaging of the thoracic spine was performed following the standard protocol without IV contrast. RADIATION DOSE REDUCTION: This exam was performed according to the departmental dose-optimization program which includes automated exposure control, adjustment of the mA and/or kV according to patient size and/or use of iterative reconstruction technique. COMPARISON:  10/17/2023 FINDINGS: CT CHEST FINDINGS Cardiovascular: Aortic atherosclerosis. Normal heart size. Trace pericardial effusion. Mediastinum/Nodes: No enlarged mediastinal, hilar, or axillary lymph nodes. Trachea and esophagus demonstrate no significant findings. Lungs/Pleura: Unchanged irregular nodule of the dependent superior  segment right lower lobe measuring 1.6 x 1.1 cm (series 8, image 68). No pleural effusion or pneumothorax. Musculoskeletal: No chest wall mass or suspicious osseous lesions identified. Upper abdomen: No acute findings. 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 CT THORACIC SPINE FINDINGS Alignment: Normal thoracic kyphosis. Vertebral bodies: Intact. No fracture or dislocation. Disc spaces: Mild multilevel thoracic disc degenerative disease with bridging osteophytes throughout the mid to lower thoracic spine in keeping with DISH. Paraspinous soft tissues: Unremarkable. IMPRESSION: 1. No acute noncontrast CT findings of the chest or thoracic spine. 2. Specifically, no displaced rib fractures. 3. Unchanged irregular nodule of the dependent superior segment right lower lobe measuring 1.6 x 1.1 cm, previously biopsied. 4. 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 Aortic Atherosclerosis (ICD10-I70.0). Electronically Signed   By: Fredricka Jenny M.D.   On: 01/25/2024 19:50   CT Head Wo Contrast Result Date: 01/25/2024 CLINICAL DATA:  Several falls over the past few days, initial encounter EXAM: CT HEAD WITHOUT CONTRAST CT CERVICAL SPINE WITHOUT CONTRAST TECHNIQUE: Multidetector CT imaging of the head and cervical spine was performed following the standard protocol  without intravenous contrast. Multiplanar CT image reconstructions of the cervical spine were also generated. RADIATION DOSE REDUCTION: This exam was performed according to the departmental dose-optimization program which includes automated exposure control, adjustment of the mA and/or kV according to patient size and/or use of iterative reconstruction technique. COMPARISON:  10/14/2023 FINDINGS: CT HEAD FINDINGS Brain: No evidence of acute infarction, hemorrhage, hydrocephalus,  extra-axial collection or mass lesion/mass effect. Vascular: No hyperdense vessel or unexpected calcification. Skull: Normal. Negative for fracture or focal lesion. Sinuses/Orbits: No acute finding. Other: None. CT CERVICAL SPINE FINDINGS Alignment: Within normal limits. Skull base and vertebrae: 7 cervical segments are well visualized. Vertebral body height is well maintained. Multilevel osteophytic changes and facet hypertrophic changes are seen. No acute fracture or acute facet abnormality is noted. Soft tissues and spinal canal: Surrounding soft tissue structures are within normal limits. Upper chest: Visualized lung apices are within normal limits. Other: None IMPRESSION: CT of the head: No acute intracranial abnormality noted. CT of cervical spine: Mild degenerative change without acute abnormality. Electronically Signed   By: Violeta Grey M.D.   On: 01/25/2024 19:31   CT Cervical Spine Wo Contrast Result Date: 01/25/2024 CLINICAL DATA:  Several falls over the past few days, initial encounter EXAM: CT HEAD WITHOUT CONTRAST CT CERVICAL SPINE WITHOUT CONTRAST TECHNIQUE: Multidetector CT imaging of the head and cervical spine was performed following the standard protocol without intravenous contrast. Multiplanar CT image reconstructions of the cervical spine were also generated. RADIATION DOSE REDUCTION: This exam was performed according to the departmental dose-optimization program which includes automated exposure control, adjustment of the mA and/or kV according to patient size and/or use of iterative reconstruction technique. COMPARISON:  10/14/2023 FINDINGS: CT HEAD FINDINGS Brain: No evidence of acute infarction, hemorrhage, hydrocephalus, extra-axial collection or mass lesion/mass effect. Vascular: No hyperdense vessel or unexpected calcification. Skull: Normal. Negative for fracture or focal lesion. Sinuses/Orbits: No acute finding. Other: None. CT CERVICAL SPINE FINDINGS Alignment: Within normal  limits. Skull base and vertebrae: 7 cervical segments are well visualized. Vertebral body height is well maintained. Multilevel osteophytic changes and facet hypertrophic changes are seen. No acute fracture or acute facet abnormality is noted. Soft tissues and spinal canal: Surrounding soft tissue structures are within normal limits. Upper chest: Visualized lung apices are within normal limits. Other: None IMPRESSION: CT of the head: No acute intracranial abnormality noted. CT of cervical spine: Mild degenerative change without acute abnormality. Electronically Signed   By: Violeta Grey M.D.   On: 01/25/2024 19:31    EKG: Independently reviewed.  Sinus rhythm, LAFB, borderline T wave abnormalities, QTc 491.  QT interval improved since previous tracing and no acute changes.  Assessment and Plan  Recurrent syncope Likely related to orthostatic hypotension.  Blood pressure dropped from systolic 141 supine to systolic 119 immediately upon standing.  Echo done in January 2025 showed EF 60 to 65%, trace aortic regurgitation, moderate tricuspid regurgitation, small pericardial effusion, and mild ascending aorta dilation.  EKG showing sinus rhythm and no acute changes.  Initial troponin negative and repeat pending.  Patient is not having active chest pain at this time.  CT chest without contrast negative for acute findings.  PE less likely as she is chronically anticoagulated and reports compliance with Eliquis .  Not tachycardic or hypoxic.  CT head negative for acute intracranial abnormality and no focal neurodeficit on exam. -Cardiac monitoring -Hold Cardizem  and metoprolol  at this time -Continue IV fluid hydration -Continue midodrine  10 mg 3 times daily -Repeat echocardiogram ordered -  Repeat orthostatics in the morning  Acute cystitis Patient is endorsing urinary frequency and urgency.  UA with positive nitrite, trace leukocytes, and microscopy showing 6-10 WBCs and few bacteria.  No signs of sepsis.  No  abdominal or flank pain. -Continue ceftriaxone -Follow-up urine culture  Vomiting and constipation Abdominal exam benign and not actively vomiting at this time.  Keep n.p.o. for now and stat KUB ordered to assess for signs of possible bowel obstruction.  Multiple falls No traumatic injuries identified on CT head/C-spine/chest/T-spine. -PT/OT eval -Fall precautions  Paroxysmal A-fib Currently in sinus rhythm.  Hold Eliquis  until KUB is done.  Holding Cardizem  and metoprolol  at this time due to concern for orthostatic hypotension and recurrent syncope.  History of PSVT status post ablation Currently in sinus rhythm.  Holding Cardizem  and metoprolol  at this time due to concern for orthostatic hypotension and recurrent syncope.  CKD stage IV Mild normal anion gap metabolic acidosis Creatinine stable.  VBG ordered to check pH.  Type 2 diabetes Last A1c 4.7 on 11/28/2023.  She was only taking Mounjaro  at home which was stopped by PCP yesterday due to concern for hypoglycemia/CBG as low as 60s at home.  CBG 80 on labs done in the ED.  Continue CBG checks ACHS.  Hyperlipidemia GERD Asthma: Stable, no signs of acute exacerbation.  Albuterol  neb PRN. Anxiety, depression, bipolar disorder, PTSD IBS Pharmacy med rec pending.  DVT prophylaxis: Holding Eliquis  until KUB is done.  Also pharmacy med rec currently pending. Code Status: Full Code (discussed with the patient) Family Communication: No family available at this time. Level of care: Telemetry bed Admission status: It is my clinical opinion that referral for OBSERVATION is reasonable and necessary in this patient based on the above information provided. The aforementioned taken together are felt to place the patient at high risk for further clinical deterioration. However, it is anticipated that the patient may be medically stable for discharge from the hospital within 24 to 48 hours.  Juliette Oh MD [facility]  If  7PM-7AM, please contact night-coverage [email or web address]  01/26/2024, 12:28 AM

## 2024-01-26 NOTE — Care Management Obs Status (Signed)
 MEDICARE OBSERVATION STATUS NOTIFICATION   Patient Details  Name: Paula Stout MRN: [id number] Date of Birth: [birth year]   Medicare Observation Status Notification Given:  Yes  [place]oon/Obs letter signed and a copy given  Wynonia Hedges 01/26/2024, 1:46 PM

## 2024-01-26 NOTE — Progress Notes (Signed)
 Patient to be enrolled with [facility]. Routed to Rx Prior Auth Team/[place].

## 2024-01-26 NOTE — Plan of Care (Signed)

## 2024-01-26 NOTE — Telephone Encounter (Signed)
 Patient advised of approval. Appt schedule for 02/24/24.    Script sent to [facility].

## 2024-01-26 NOTE — Progress Notes (Signed)
  Echocardiogram 2D Echocardiogram has been performed.  Paula Stout 01/26/2024, 12:21 PM

## 2024-01-27 ENCOUNTER — Other Ambulatory Visit (HOSPITAL_COMMUNITY): Payer: Self-pay

## 2024-01-27 DIAGNOSIS — R55 Syncope and collapse: Secondary | ICD-10-CM | POA: Diagnosis not present

## 2024-01-27 LAB — GLUCOSE, CAPILLARY
Glucose-Capillary: 88 mg/dL (ref 70–99)
Glucose-Capillary: 91 mg/dL (ref 70–99)

## 2024-01-27 MED ORDER — CEPHALEXIN 500 MG PO CAPS: 500.0000 mg | ORAL_CAPSULE | Freq: Three times a day (TID) | ORAL | 0 refills | Status: DC

## 2024-01-27 NOTE — Progress Notes (Signed)
 Reviewed AVS, patient expressed understanding of medications, MD follow up reviewed.   Removed IV, Site clean, dry and intact.  Patient states all belongings brought to the hospital at time of admission are accounted for and packed to take home.  Patient informed and expressed understanding on where to pick up discharge medications  Pt transported by [facility] to entrance C where family member was waiting in vehicle to transport home.

## 2024-01-27 NOTE — Discharge Summary (Addendum)
 Physician Discharge Summary  Paula Stout ZOX:[id number] DOB: June 18, 1956 DOA: 01/25/2024  PCP: Paula Mulder, DO  Admit date: 01/25/2024 Discharge date: 01/27/2024  Admitted From: Home Disposition: Home  Recommendations for Outpatient Follow-up:  Follow up with PCP in 1-2 weeks Please obtain BMP/CBC in one week   Home Health: N/A Equipment/Devices: N/A  Discharge Condition: Stable CODE STATUS: Full code Diet recommendation: Regular diet, nutritional supplements  Discharge summary: 68 year old with history of paroxysmal A-fib on Eliquis , PSVT status post ablation, CKD stage IV, type 2 diabetes, hypertension, hyperlipidemia, depression anxiety and  bipolar disorder PTSD, migraines on extensive medications suffering from multiple episodes of orthostatic hypotension, dizziness lightheadedness and fall at home.  Recently started on midodrine  5 mg 3 times daily.  Patient on Mounjaro  and lost about 50 pounds in last 6 months.  Was seen at primary care office yesterday with complaint of fall so sent to the ER.  In the emergency room hemodynamically stable.  Positive orthostatics.  Skeletal survey negative.   Symptomatic orthostatic hypotension due to polypharmacy: Metoprolol  and Cardizem  were recently discontinued.  She is currently in sinus rhythm.  She is on multiple medications that can potentially cause orthostatics, however patient is unable to come off these medications.  After overnight hydration her symptoms are improved. Midodrine  10 mg 3 times daily.  Compression socks to wear all the time. Encourage oral nutrition and fluid intake. Orthostatic precautions.  Acute UTI present on admission: Patient is growing more than 100,000 colonies of gram-negative rods. Does not have much urinary symptoms.  Received Rocephin x 2.  Will continue Keflex  for 5 additional days.  Will follow-up final urine cultures.  Stable for discharge with outpatient follow-up.   Discharge  Diagnoses:  Principal Problem:   Syncope Active Problems:   Paroxysmal supraventricular tachycardia (HCC)   Acute cystitis   Paroxysmal atrial fibrillation (HCC)   Multiple falls    Discharge Instructions  Discharge Instructions     Diet - low sodium heart healthy   Complete by: As directed    Diet Carb Modified   Complete by: As directed    Discharge instructions   Complete by: As directed    Wear compression socks  Take orthostatic precautions all the time   Increase activity slowly   Complete by: As directed       Allergies as of 01/27/2024       Reactions   Bee Venom Anaphylaxis   Onion Other (See Comments)   Respiratory Distress   Tetanus-diphtheria Toxoids Td Anaphylaxis   Sumatriptan    Passed out, nose bleed   Zolpidem  Other (See Comments)   Causes sleep walking   Asa [aspirin ] Nausea And Vomiting   Iodine Rash   Nsaids Nausea Only   Rash, hives and trouble breathing   Sulfa Antibiotics Rash   Vancomycin Rash   Other Reaction(s): STEVENS-JOHNSON        Medication List     STOP taking these medications    diltiazem  120 MG 24 hr capsule Commonly known as: CARDIZEM  CD   metoprolol  tartrate 25 MG tablet Commonly known as: LOPRESSOR        TAKE these medications    acetaminophen  500 MG tablet Commonly known as: TYLENOL  Take 1,000 mg by mouth every 6 (six) hours as needed for mild pain.   Ajovy  225 MG/1.5ML Soaj Generic drug: Fremanezumab -vfrm Inject 225 mg into the skin every 30 (thirty) days.   apixaban  5 MG Tabs tablet Commonly known as: Eliquis  Take 1 tablet (5  mg total) by mouth 2 (two) times daily.   atorvastatin  80 MG tablet Commonly known as: LIPITOR  TAKE 1 TABLET DAILY   Baclofen  5 MG Tabs Take 1 tablet (5 mg total) by mouth 2 (two) times daily as needed. What changed: reasons to take this   blood glucose meter kit and supplies One Touch Ultra  Check blood sugars once daily Dx: E11.9 What changed:  how much to take how  to take this when to take this   botulinum toxin Type A  200 units injection Commonly known as: BOTOX  Inject 155 units IM into multiple site in the face,neck and head once every 90 days   budesonide -formoterol  80-4.5 MCG/ACT inhaler Commonly known as: Symbicort  Inhale 2 puffs into the lungs 2 (two) times daily.   buPROPion  300 MG 24 hr tablet Commonly known as: WELLBUTRIN  XL Take 1 tablet (300 mg total) by mouth daily.   cephALEXin  500 MG capsule Commonly known as: KEFLEX  Take 1 capsule (500 mg total) by mouth 3 (three) times daily for 5 days.   Dexcom G7 Sensor Misc Check blood sugars as directed   EPINEPHrine  0.3 mg/0.3 mL Soaj injection Commonly known as: EPI-PEN Inject 0.3 mg into the muscle as needed for anaphylaxis.   escitalopram  20 MG tablet Commonly known as: Lexapro  Take 1 tablet (20 mg total) by mouth daily.   ezetimibe  10 MG tablet Commonly known as: ZETIA  TAKE 1 TABLET DAILY   Farxiga  10 MG Tabs tablet Generic drug: dapagliflozin  propanediol TAKE 1 TABLET DAILY What changed: when to take this   fenofibrate  160 MG tablet Take 1 tablet (160 mg total) by mouth daily. What changed: when to take this   fluticasone  50 MCG/ACT nasal spray Commonly known as: FLONASE  Place 2 sprays into both nostrils daily.   levocetirizine 5 MG tablet Commonly known as: XYZAL  TAKE 1 TABLET EVERY EVENING   LORazepam  0.5 MG tablet Commonly known as: Ativan  Take 1 tablet (0.5 mg total) by mouth every 8 (eight) hours. What changed:  when to take this reasons to take this   Lurasidone  HCl 120 MG Tabs Take 1 tablet (120 mg total) by mouth at bedtime.   magnesium  oxide 400 (240 Mg) MG tablet Commonly known as: MAG-OX Take 400 mg by mouth daily.   midodrine  10 MG tablet Commonly known as: PROAMATINE  Take 1 tablet (10 mg total) by mouth 3 (three) times daily.   Mounjaro  7.5 MG/0.5ML Pen Generic drug: tirzepatide  Inject 7.5 mg into the skin once a week.    multivitamin with minerals tablet Take 1 tablet by mouth daily. Daily Diabetes Health Pack - MVI + chromium, fish oil + vit D, magnesium , vit C, alpha lipoic acid w/ green tea   nitroGLYCERIN  0.4 MG SL tablet Commonly known as: NITROSTAT  Place 1 tablet (0.4 mg total) under the tongue every 5 (five) minutes as needed for chest pain.   Nurtec 75 MG Tbdp Generic drug: Rimegepant Sulfate Take 1 tablet (75 mg total) by mouth as needed (take 1 tab at the earlist onset of a migraine. Max 1 tab in 24 hours).   ondansetron  4 MG tablet Commonly known as: ZOFRAN  Take 1 tablet (4 mg total) by mouth every 6 (six) hours. What changed:  when to take this reasons to take this   OneTouch Verio test strip Generic drug: glucose blood USE DAILY TO CHECK BLOOD SUGAR What changed: See the new instructions.   pantoprazole  40 MG tablet Commonly known as: PROTONIX  TAKE 1 TABLET DAILY  predniSONE  20 MG tablet Commonly known as: DELTASONE  Take 2 tablets (40 mg total) by mouth daily with breakfast for 5 days.   QUEtiapine  300 MG tablet Commonly known as: SEROQUEL  Take 1 tablet (300 mg total) by mouth at bedtime.   topiramate  50 MG tablet Commonly known as: TOPAMAX  TAKE 3 TABLETS TWICE A DAY        Follow-up Information     Paula Mulder, DO Follow up on 02/01/2024.   Specialty: Family Medicine Why: 1:00 pm for hospital follow up Contact information: [address] [place] [address] [phone]                Allergies  Allergen Reactions   Bee Venom Anaphylaxis   Onion Other (See Comments)    Respiratory Distress   Tetanus-Diphtheria Toxoids Td Anaphylaxis   Sumatriptan     Passed out, nose bleed    Zolpidem  Other (See Comments)    Causes sleep walking   Asa [Aspirin ] Nausea And Vomiting   Iodine Rash   Nsaids Nausea Only    Rash, hives and trouble breathing   Sulfa Antibiotics Rash   Vancomycin Rash    Other Reaction(s):  STEVENS-JOHNSON    Consultations: None   Procedures/Studies: ECHOCARDIOGRAM COMPLETE Result Date: 01/26/2024    ECHOCARDIOGRAM REPORT   Patient Name:   Paula Stout Date of Exam: 01/26/2024 Medical Rec #:  [id number]       Height:       66.0 in Accession #:    [id number]      Weight:       158.7 lb Date of Birth:  12/27/1955       BSA:          1.813 m Patient Age:    68 years        BP:           110/81 mmHg Patient Gender: F               HR:           70 bpm. Exam Location:  Inpatient Procedure: 2D Echo, Cardiac Doppler and Color Doppler (Both Spectral and Color            Flow Doppler were utilized during procedure). Indications:    R55 Syncope; I31.3 Pericardial effusion (noninflammatory)  History:        Patient has prior history of Echocardiogram examinations, most                 recent 06/23/2023. Abnormal ECG, Arrythmias:Atrial Fibrillation                 and SVT; Risk Factors:Dyslipidemia. Pericarditis. Upper back                 pain.  Sonographer:    Raynelle Callow RDCS Referring Phys: [id number] VASUNDHRA RATHORE IMPRESSIONS  1. Left ventricular ejection fraction, by estimation, is 60 to 65%. The left ventricle has normal function. The left ventricle has no regional wall motion abnormalities. There is mild left ventricular hypertrophy. Left ventricular diastolic parameters were normal.  2. Right ventricular systolic function is normal. The right ventricular size is normal. There is normal pulmonary artery systolic pressure. The estimated right ventricular systolic pressure is 34.4 mmHg.  3. A small pericardial effusion is present.  4. The mitral valve is normal in structure. No evidence of mitral valve regurgitation. No evidence of mitral stenosis.  5. The aortic valve is normal in  structure. Aortic valve regurgitation is trivial. No aortic stenosis is present.  6. The inferior vena cava is normal in size with greater than 50% respiratory variability, suggesting right atrial pressure of 3 mmHg.  FINDINGS  Left Ventricle: Left ventricular ejection fraction, by estimation, is 60 to 65%. The left ventricle has normal function. The left ventricle has no regional wall motion abnormalities. The left ventricular internal cavity size was normal in size. There is  mild left ventricular hypertrophy. Left ventricular diastolic parameters were normal. Right Ventricle: The right ventricular size is normal. No increase in right ventricular wall thickness. Right ventricular systolic function is normal. There is normal pulmonary artery systolic pressure. The tricuspid regurgitant velocity is 2.57 m/s, and  with an assumed right atrial pressure of 8 mmHg, the estimated right ventricular systolic pressure is 34.4 mmHg. Left Atrium: Left atrial size was normal in size. Right Atrium: Right atrial size was normal in size. Pericardium: A small pericardial effusion is present. Mitral Valve: The mitral valve is normal in structure. No evidence of mitral valve regurgitation. No evidence of mitral valve stenosis. Tricuspid Valve: The tricuspid valve is normal in structure. Tricuspid valve regurgitation is mild . No evidence of tricuspid stenosis. Aortic Valve: The aortic valve is normal in structure. Aortic valve regurgitation is trivial. No aortic stenosis is present. Pulmonic Valve: The pulmonic valve was normal in structure. Pulmonic valve regurgitation is mild. No evidence of pulmonic stenosis. Aorta: The aortic root is normal in size and structure. Venous: The inferior vena cava is normal in size with greater than 50% respiratory variability, suggesting right atrial pressure of 3 mmHg. IAS/Shunts: No atrial level shunt detected by color flow Doppler.  LEFT VENTRICLE PLAX 2D LVIDd:         4.40 cm      Diastology LVIDs:         2.80 cm      LV e' medial:    6.74 cm/s LV PW:         1.20 cm      LV E/e' medial:  12.3 LV IVS:        1.20 cm      LV e' lateral:   7.62 cm/s                             LV E/e' lateral: 10.9  LV  Volumes (MOD) LV vol d, MOD A2C: 121.0 ml LV vol d, MOD A4C: 108.0 ml LV vol s, MOD A2C: 47.1 ml LV vol s, MOD A4C: 46.4 ml LV SV MOD A2C:     73.9 ml LV SV MOD A4C:     108.0 ml LV SV MOD BP:      72.8 ml RIGHT VENTRICLE             IVC RV S prime:     11.00 cm/s  IVC diam: 1.80 cm TAPSE (M-mode): 1.3 cm LEFT ATRIUM           Index        RIGHT ATRIUM           Index LA diam:      3.30 cm 1.82 cm/m   RA Area:     15.00 cm LA Vol (A2C): 18.9 ml 10.43 ml/m  RA Volume:   37.90 ml  20.91 ml/m LA Vol (A4C): 46.4 ml 25.60 ml/m  AORTIC VALVE  PULMONIC VALVE LVOT Vmax:   133.00 cm/s PR End Diast Vel: 1.88 msec LVOT Vmean:  86.600 cm/s LVOT VTI:    0.276 m  AORTA Ao Root diam: 3.20 cm Ao Asc diam:  3.65 cm MITRAL VALVE               TRICUSPID VALVE MV Area (PHT): 3.77 cm    TR Peak grad:   26.4 mmHg MV Decel Time: 201 msec    TR Vmax:        257.00 cm/s MV E velocity: 82.90 cm/s MV A velocity: 81.30 cm/s  SHUNTS MV E/A ratio:  1.02        Systemic VTI: 0.28 m Aditya Sabharwal Electronically signed by Alwin Baars Signature Date/Time: 01/26/2024/6:13:52 PM    Final    DG Abd 1 View Result Date: 01/26/2024 CLINICAL DATA:  Vomiting EXAM: ABDOMEN - 1 VIEW COMPARISON:  None Available. FINDINGS: Scattered large and small bowel gas is noted. No obstructive changes are seen. No free air is noted. Bony structures are within normal limits. IMPRESSION: No acute abnormality noted. Electronically Signed   By: Violeta Grey M.D.   On: 01/26/2024 02:18   CT Chest Wo Contrast Result Date: 01/25/2024 CLINICAL DATA:  Multiple recent falls, rib and back pain, left-sided ecchymosis, status post right lower lobe lung nodule biopsy * Tracking Code: BO * EXAM: CT CHEST WITHOUT CONTRAST CT THORACIC SPINE WITHOUT CONTRAST TECHNIQUE: Multidetector CT imaging of the chest was performed following the standard protocol without IV contrast. Multidetector CT imaging of the thoracic spine was performed following the standard  protocol without IV contrast. RADIATION DOSE REDUCTION: This exam was performed according to the departmental dose-optimization program which includes automated exposure control, adjustment of the mA and/or kV according to patient size and/or use of iterative reconstruction technique. COMPARISON:  10/17/2023 FINDINGS: CT CHEST FINDINGS Cardiovascular: Aortic atherosclerosis. Normal heart size. Trace pericardial effusion. Mediastinum/Nodes: No enlarged mediastinal, hilar, or axillary lymph nodes. Trachea and esophagus demonstrate no significant findings. Lungs/Pleura: Unchanged irregular nodule of the dependent superior segment right lower lobe measuring 1.6 x 1.1 cm (series 8, image 68). No pleural effusion or pneumothorax. Musculoskeletal: No chest wall mass or suspicious osseous lesions identified. Upper abdomen: No acute findings. 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 CT THORACIC SPINE FINDINGS Alignment: Normal thoracic kyphosis. Vertebral bodies: Intact. No fracture or dislocation. Disc spaces: Mild multilevel thoracic disc degenerative disease with bridging osteophytes throughout the mid to lower thoracic spine in keeping with DISH. Paraspinous soft tissues: Unremarkable. IMPRESSION: 1. No acute noncontrast CT findings of the chest or thoracic spine. 2. Specifically, no displaced rib fractures. 3. Unchanged irregular nodule of the dependent superior segment right lower lobe measuring 1.6 x 1.1 cm, previously biopsied. 4. 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 Aortic Atherosclerosis (ICD10-I70.0). Electronically Signed   By: Fredricka Jenny M.D.   On: 01/25/2024 19:50   CT T-SPINE NO CHARGE Result Date: 01/25/2024 CLINICAL DATA:  Multiple recent falls, rib and back pain, left-sided ecchymosis, status post right lower lobe  lung nodule biopsy * Tracking Code: BO * EXAM: CT CHEST WITHOUT CONTRAST CT THORACIC SPINE WITHOUT CONTRAST TECHNIQUE: Multidetector CT imaging of the chest was performed following the standard protocol without IV contrast. Multidetector CT imaging of the thoracic spine was performed  following the standard protocol without IV contrast. RADIATION DOSE REDUCTION: This exam was performed according to the departmental dose-optimization program which includes automated exposure control, adjustment of the mA and/or kV according to patient size and/or use of iterative reconstruction technique. COMPARISON:  10/17/2023 FINDINGS: CT CHEST FINDINGS Cardiovascular: Aortic atherosclerosis. Normal heart size. Trace pericardial effusion. Mediastinum/Nodes: No enlarged mediastinal, hilar, or axillary lymph nodes. Trachea and esophagus demonstrate no significant findings. Lungs/Pleura: Unchanged irregular nodule of the dependent superior segment right lower lobe measuring 1.6 x 1.1 cm (series 8, image 68). No pleural effusion or pneumothorax. Musculoskeletal: No chest wall mass or suspicious osseous lesions identified. Upper abdomen: No acute findings. 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 CT THORACIC SPINE FINDINGS Alignment: Normal thoracic kyphosis. Vertebral bodies: Intact. No fracture or dislocation. Disc spaces: Mild multilevel thoracic disc degenerative disease with bridging osteophytes throughout the mid to lower thoracic spine in keeping with DISH. Paraspinous soft tissues: Unremarkable. IMPRESSION: 1. No acute noncontrast CT findings of the chest or thoracic spine. 2. Specifically, no displaced rib fractures. 3. Unchanged irregular nodule of the dependent superior segment right lower lobe measuring 1.6 x 1.1 cm, previously biopsied. 4. 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 Aortic Atherosclerosis (ICD10-I70.0). Electronically Signed   By: Fredricka Jenny M.D.   On: 01/25/2024 19:50   CT Head Wo Contrast Result Date: 01/25/2024 CLINICAL DATA:  Several falls over the past few days, initial encounter EXAM: CT HEAD WITHOUT CONTRAST CT CERVICAL SPINE WITHOUT CONTRAST TECHNIQUE: Multidetector CT imaging of the head and cervical spine was performed following the standard protocol without intravenous contrast. Multiplanar CT image reconstructions of the cervical spine were also generated. RADIATION DOSE REDUCTION: This exam was performed according to the departmental dose-optimization program which includes automated exposure control, adjustment of the mA and/or kV according to patient size and/or use of iterative reconstruction technique. COMPARISON:  10/14/2023 FINDINGS: CT HEAD FINDINGS Brain: No evidence of acute infarction, hemorrhage, hydrocephalus, extra-axial collection or mass lesion/mass effect. Vascular: No hyperdense vessel or unexpected calcification. Skull: Normal. Negative for fracture or focal lesion. Sinuses/Orbits: No acute finding. Other: None. CT CERVICAL SPINE FINDINGS Alignment: Within normal limits. Skull base and vertebrae: 7 cervical segments are well visualized. Vertebral body height is well maintained. Multilevel osteophytic changes and facet hypertrophic changes are seen. No acute fracture or acute facet abnormality is noted. Soft tissues and spinal canal: Surrounding soft tissue structures are within normal limits. Upper chest: Visualized lung apices are within normal limits. Other: None IMPRESSION: CT of the head: No acute intracranial abnormality noted. CT of cervical spine: Mild degenerative change without acute abnormality. Electronically Signed   By: Violeta Grey M.D.   On: 01/25/2024 19:31   CT Cervical Spine Wo Contrast Result Date: 01/25/2024 CLINICAL DATA:  Several falls over the past few days, initial  encounter EXAM: CT HEAD WITHOUT CONTRAST CT CERVICAL SPINE WITHOUT CONTRAST TECHNIQUE: Multidetector CT imaging of the head and cervical spine was performed following the standard protocol without intravenous contrast. Multiplanar CT image reconstructions of the cervical spine were also generated. RADIATION DOSE REDUCTION: This exam was performed according to the departmental dose-optimization program which includes automated exposure control, adjustment of the mA and/or kV according to patient size and/or use of iterative reconstruction technique. COMPARISON:  10/14/2023 FINDINGS: CT HEAD  FINDINGS Brain: No evidence of acute infarction, hemorrhage, hydrocephalus, extra-axial collection or mass lesion/mass effect. Vascular: No hyperdense vessel or unexpected calcification. Skull: Normal. Negative for fracture or focal lesion. Sinuses/Orbits: No acute finding. Other: None. CT CERVICAL SPINE FINDINGS Alignment: Within normal limits. Skull base and vertebrae: 7 cervical segments are well visualized. Vertebral body height is well maintained. Multilevel osteophytic changes and facet hypertrophic changes are seen. No acute fracture or acute facet abnormality is noted. Soft tissues and spinal canal: Surrounding soft tissue structures are within normal limits. Upper chest: Visualized lung apices are within normal limits. Other: None IMPRESSION: CT of the head: No acute intracranial abnormality noted. CT of cervical spine: Mild degenerative change without acute abnormality. Electronically Signed   By: Violeta Grey M.D.   On: 01/25/2024 19:31   DG Chest 2 View Result Date: 01/09/2024 CLINICAL DATA:  Chest pain. EXAM: CHEST - 2 VIEW COMPARISON:  11/23/2023, CT 10/17/2023 FINDINGS: Normal heart size and mediastinal contours. The right lower lobe nodular density on prior CT is not seen on the current exam. No evidence of acute airspace disease. No pulmonary edema, large pleural effusion or pneumothorax. No acute osseous  abnormalities are seen. IMPRESSION: 1. No acute chest findings. 2. The right lower lobe nodular density on prior CT is not seen on the current exam. Electronically Signed   By: Chadwick Colonel M.D.   On: 01/09/2024 17:41   (Echo, Carotid, EGD, Colonoscopy, ERCP)    Subjective: Patient seen and examined.  No overnight events.  Getting up and walking around without dizziness or lightheadedness.  No orthostatic drop.  Discussed about orthostatic precautions compression socks and fall precautions.  Patient is agreeable.   Discharge Exam: Vitals:   01/27/24 0348 01/27/24 0841  BP: (!) 107/56 117/73  Pulse:  72  Resp:    Temp: 98.7 F (37.1 C) 98.8 F (37.1 C)  SpO2: 98% 98%   Vitals:   01/26/24 2036 01/26/24 2323 01/27/24 0348 01/27/24 0841  BP: 136/71 121/70 (!) 107/56 117/73  Pulse: 72 70  72  Resp: 16 16    Temp: 98.6 F (37 C) 98.2 F (36.8 C) 98.7 F (37.1 C) 98.8 F (37.1 C)  TempSrc:   Oral   SpO2: 95% 97% 98% 98%  Weight:      Height:        General: Pt is alert, awake, not in acute distress Cardiovascular: RRR, S1/S2 +, no rubs, no gallops Respiratory: CTA bilaterally, no wheezing, no rhonchi Abdominal: Soft, NT, ND, bowel sounds + Extremities: no edema, no cyanosis    The results of significant diagnostics from this hospitalization (including imaging, microbiology, ancillary and laboratory) are listed below for reference.     Microbiology: Recent Results (from the past 240 hours)  Urine Culture     Status: Abnormal (Preliminary result)   Collection Time: 01/25/24  6:30 PM   Specimen: Urine, Clean Catch  Result Value Ref Range Status   Specimen Description   Final    URINE, CLEAN CATCH Performed at [facility], [address] [place], [place] [address]    Special Requests   Final    NONE Performed at [facility], [address] [place], [place] [address]    Culture (A)  Final    >=100,000 COLONIES/mL ESCHERICHIA  COLI SUSCEPTIBILITIES TO FOLLOW Performed at [facility], [address], [place], [place] [address]    Report Status PENDING  Incomplete  Labs: BNP (last 3 results) Recent Labs    05/15/23 1952  BNP 62.1   Basic Metabolic Panel: Recent Labs  Lab 01/25/24 1845 01/26/24 0646  NA 140 140  K 3.6 3.4*  CL 107 114*  CO2 19* 16*  GLUCOSE 80 89  BUN 18 14  CREATININE 1.96* 1.73*  CALCIUM  9.7 8.8*   Liver Function Tests: Recent Labs  Lab 01/25/24 1845  AST 28  ALT 15  ALKPHOS 61  BILITOT 0.6  PROT 6.4*  ALBUMIN 3.8   No results for input(s): "LIPASE", "AMYLASE" in the last 168 hours. No results for input(s): "AMMONIA" in the last 168 hours. CBC: Recent Labs  Lab 01/25/24 1845  WBC 6.3  NEUTROABS 3.6  HGB 11.6*  HCT 35.4*  MCV 93.9  PLT 304   Cardiac Enzymes: No results for input(s): "CKTOTAL", "CKMB", "CKMBINDEX", "TROPONINI" in the last 168 hours. BNP: Invalid input(s): "POCBNP" CBG: Recent Labs  Lab 01/26/24 1220 01/26/24 1635 01/26/24 2324 01/27/24 0646 01/27/24 0843  GLUCAP 81 86 89 88 91   D-Dimer No results for input(s): "DDIMER" in the last 72 hours. Hgb A1c No results for input(s): "HGBA1C" in the last 72 hours. Lipid Profile No results for input(s): "CHOL", "HDL", "LDLCALC", "TRIG", "CHOLHDL", "LDLDIRECT" in the last 72 hours. Thyroid  function studies No results for input(s): "TSH", "T4TOTAL", "T3FREE", "THYROIDAB" in the last 72 hours.  Invalid input(s): "FREET3" Anemia work up No results for input(s): "VITAMINB12", "FOLATE", "FERRITIN", "TIBC", "IRON", "RETICCTPCT" in the last 72 hours. Urinalysis    Component Value Date/Time   COLORURINE YELLOW 01/25/2024 1745   APPEARANCEUR HAZY (A) 01/25/2024 1745   LABSPEC >=1.030 01/25/2024 1745   PHURINE 5.5 01/25/2024 1745   GLUCOSEU >=500 (A) 01/25/2024 1745   HGBUR NEGATIVE 01/25/2024 1745   BILIRUBINUR NEGATIVE 01/25/2024 1745   KETONESUR NEGATIVE 01/25/2024 1745    PROTEINUR 30 (A) 01/25/2024 1745   NITRITE POSITIVE (A) 01/25/2024 1745   LEUKOCYTESUR TRACE (A) 01/25/2024 1745   Sepsis Labs Recent Labs  Lab 01/25/24 1845  WBC 6.3   Microbiology Recent Results (from the past 240 hours)  Urine Culture     Status: Abnormal (Preliminary result)   Collection Time: 01/25/24  6:30 PM   Specimen: Urine, Clean Catch  Result Value Ref Range Status   Specimen Description   Final    URINE, CLEAN CATCH Performed at [facility], [address] [place], [place] [address]    Special Requests   Final    NONE Performed at [facility], [address] [place], [place] [address]    Culture (A)  Final    >=100,000 COLONIES/mL ESCHERICHIA COLI SUSCEPTIBILITIES TO FOLLOW Performed at [facility], [address], [place], [place] [address]    Report Status PENDING  Incomplete     Time coordinating discharge: 28 minutes  SIGNED:   Vada Garibaldi, MD  [facility] 01/27/2024, 10:52 AM

## 2024-01-27 NOTE — TOC CM/SW Note (Signed)
 Transition of Care [facility]) - Inpatient Brief Assessment   Patient Details  Name: Paula Stout MRN: [id number] Date of Birth: 08-09-1956  Transition of Care [facility]) CM/SW Contact:    Jennett Model, RN Phone Number: 01/27/2024, 10:19 AM   Clinical Narrative: From home , has PCP and insurance on file, states has no HH services in place at this time , has shower chair at home.  States daughter will transport them home at [employer] and family is support system, states gets medications from [facility].  Pta self ambulatory.    Transition of Care Asessment: Insurance and Status: Insurance coverage has been reviewed Patient has primary care physician: Yes Home environment has been reviewed: from home Prior level of function:: indep Prior/Current Home Services: Current home services (shower chair) Social Drivers of Health Review: SDOH reviewed no interventions necessary Readmission risk has been reviewed: Yes Transition of care needs: no transition of care needs at this time

## 2024-01-27 NOTE — TOC Transition Note (Signed)
 Transition of Care [facility]) - Discharge Note   Patient Details  Name: Paula Stout MRN: [id number] Date of Birth: [birth year]  Transition [facility] CM/SW Contact:  Jennett Model, RN Phone Number: 01/27/2024, 10:21 AM   Clinical Narrative:    For dc today, has no needs.          Patient Goals and CMS Choice            Discharge Placement                       Discharge Plan and Services Additional resources added to the After Visit Summary for                                       Social Drivers of Health (SDOH) Interventions SDOH Screenings   Food Insecurity: No Food Insecurity (01/26/2024)  Housing: Low Risk  (01/26/2024)  Transportation Needs: No Transportation Needs (01/26/2024)  Utilities: At Risk (01/26/2024)  Alcohol Screen: Low Risk  (10/11/2023)  Depression (PHQ2-9): Low Risk  (11/28/2023)  Financial Resource Strain: Low Risk  (10/11/2023)  Recent Concern: Financial Resource Strain - Medium Risk (07/24/2023)  Physical Activity: Inactive (10/11/2023)  Social Connections: Socially Isolated (01/26/2024)  Stress: Stress Concern Present (10/11/2023)  Tobacco Use: Low Risk  (01/26/2024)  Health Literacy: Adequate Health Literacy (08/09/2023)     Readmission Risk Interventions     No data to display

## 2024-01-27 NOTE — Plan of Care (Signed)
  Problem: Health Behavior/Discharge Planning: Goal: Ability to manage health-related needs will improve Outcome: Progressing   Problem: Clinical Measurements: Goal: Ability to maintain clinical measurements within normal limits will improve Outcome: Progressing Goal: Will remain free from infection Outcome: Progressing Goal: Diagnostic test results will improve Outcome: Progressing Goal: Respiratory complications will improve Outcome: Progressing Goal: Cardiovascular complication will be avoided Outcome: Progressing   Problem: Activity: Goal: Risk for activity intolerance will decrease Outcome: Progressing   Problem: Coping: Goal: Level of anxiety will decrease Outcome: Progressing

## 2024-01-28 ENCOUNTER — Other Ambulatory Visit: Payer: Self-pay | Admitting: Internal Medicine

## 2024-01-28 ENCOUNTER — Ambulatory Visit: Payer: Self-pay | Admitting: Internal Medicine

## 2024-01-28 LAB — URINE CULTURE: Culture: >=100000 — AB

## 2024-01-28 MED ORDER — CIPROFLOXACIN HCL 500 MG PO TABS: 500.0000 mg | ORAL_TABLET | Freq: Two times a day (BID) | ORAL | 0 refills | Status: AC

## 2024-02-01 ENCOUNTER — Encounter: Payer: Self-pay | Admitting: Family Medicine

## 2024-02-01 ENCOUNTER — Other Ambulatory Visit: Payer: Self-pay

## 2024-02-01 ENCOUNTER — Other Ambulatory Visit: Payer: Self-pay | Admitting: Pharmacy Technician

## 2024-02-01 ENCOUNTER — Other Ambulatory Visit (HOSPITAL_COMMUNITY): Payer: Self-pay

## 2024-02-01 ENCOUNTER — Encounter: Payer: Self-pay | Admitting: Behavioral Health

## 2024-02-01 ENCOUNTER — Ambulatory Visit (INDEPENDENT_AMBULATORY_CARE_PROVIDER_SITE_OTHER): Payer: Medicare (Managed Care) | Admitting: Behavioral Health

## 2024-02-01 ENCOUNTER — Ambulatory Visit (INDEPENDENT_AMBULATORY_CARE_PROVIDER_SITE_OTHER): Payer: Medicare (Managed Care) | Admitting: Family Medicine

## 2024-02-01 ENCOUNTER — Inpatient Hospital Stay: Payer: Medicare (Managed Care) | Admitting: Family Medicine

## 2024-02-01 VITALS — BP 105/60 | HR 68 | Temp 98.0°F | Resp 16 | Ht 66.0 in | Wt 158.0 lb

## 2024-02-01 DIAGNOSIS — R2681 Unsteadiness on feet: Secondary | ICD-10-CM

## 2024-02-01 DIAGNOSIS — F411 Generalized anxiety disorder: Secondary | ICD-10-CM

## 2024-02-01 DIAGNOSIS — I951 Orthostatic hypotension: Secondary | ICD-10-CM | POA: Diagnosis not present

## 2024-02-01 DIAGNOSIS — F331 Major depressive disorder, recurrent, moderate: Secondary | ICD-10-CM | POA: Diagnosis not present

## 2024-02-01 DIAGNOSIS — E876 Hypokalemia: Secondary | ICD-10-CM

## 2024-02-01 DIAGNOSIS — F319 Bipolar disorder, unspecified: Secondary | ICD-10-CM

## 2024-02-01 NOTE — Progress Notes (Signed)
 Specialty Pharmacy Initial Fill Coordination Note  Paula Stout is a 68 y.o. female contacted today regarding initial fill of specialty medication(s) OnabotulinumtoxinA  (BOTOX )   Patient requested Courier to Provider Office   Delivery date: 02/08/24   Verified address: [facility]  [address] [place], [place], [address]   Medication will be filled on 6.3.25.   Patient is aware of $0 copayment.

## 2024-02-01 NOTE — Progress Notes (Signed)
 Chief Complaint  Paula Stout presents with   Hospitalization Follow-up    Hospital Follow up    Subjective: Paula Stout is a 68 y.o. female here for hospitalization follow-up.  1 week ago, the Paula Stout was seen in our office for falls.  Blood pressure had been dropping.  Oral intake was poor as she was on Mounjaro .  Initially it had been decreased from 15 mg weekly to 7.5 mg weekly.  It was stopped at her visit 1 week ago.  She was only taking midodrine  5 mg once daily as she was told to take it with food and she was not eating.  It was increased from at her visit 1 week ago.  A referral to physical therapy was also placed.  Because she was unstable and not felt safe to send home, she was sent to the emergency department where she was admitted to [facility] for 2 days.  Some medicines were stopped.  She remained in sinus rhythm per report.  While she was not told to restart metoprolol  and diltiazem , she did. Hx of A fib and paroxysmal SVT.  She has a loop implant procedure with the EP team on 6/9 and follows up with her cardiologist the following day.  She still feels unsteady overall.  She tries to stay hydrated.  Diet is not much changed.  Blood pressures are running in the 90s over 60s on average.  She is compliant with midodrine  10 mg 3 times daily.  No adverse effects.  Past Medical History:  Diagnosis Date   Adhesive capsulitis of left shoulder 12/16/2016   Asthma without status asthmaticus 02/06/2021   At risk for falls 02/04/2019   Atrial fibrillation (HCC) 09/01/2014   Last Assessment & Plan:  Formatting of this note might be different from the original. A:  Chronic.  Sinus rhythm at this time.  States she was told this during admission at [facility] in the past. P:  On baby aspirin  at home will continue.  Low dose metoprolol  started.   Bipolar 1 disorder, depressed, moderate (HCC) 02/18/2020   Capsulitis of left shoulder 10/20/2021   Chronic anticoagulation 02/20/2020   Chronic atrial  fibrillation (HCC) 02/18/2020   Chronic headache 10/11/2013   Chronic interstitial cystitis 12/28/2005   Chronic migraine without aura, intractable, without status migrainosus 06/24/2017   Colon cancer screening 04/15/2016   Last Assessment & Plan:  Formatting of this note might be different from the original. Will schedule for colonoscopy   Depression, recurrent (HCC) 02/18/2020   Diabetes mellitus (HCC)    Dyslipidemia 02/20/2020   GAD (generalized anxiety disorder) 02/18/2020   GERD (gastroesophageal reflux disease) 11/05/2021   H/O chest pain 07/31/2021   History of atrial fibrillation 06/24/2017   History of posttraumatic stress disorder (PTSD) 10/07/2016   Hyperosmolarity due to secondary diabetes mellitus (HCC) 02/06/2021   Impaired mobility and activities of daily living 11/14/2015   Irritable bowel syndrome (IBS)    Junctional tachycardia (HCC) 11/05/2021   Kidney stone 02/06/2021   Migraine without aura, not refractory 02/06/2021   Mixed hyperlipidemia 04/06/2021   Last Assessment & Plan:  Formatting of this note might be different from the original. Due for labs with PCP in November   Morbid obesity (HCC) 12/16/2016   Paroxysmal supraventricular tachycardia (HCC) 11/05/2021   Pneumonia, organism unspecified(486) 11/21/2002   Polyneuropathy due to type 2 diabetes mellitus (HCC) 02/06/2021   Primary hypertension 04/06/2021   Last Assessment & Plan:  Formatting of this note might be different from  the original. Controlled   Refractory migraine with aura 02/06/2021   Refractory migraine without aura 02/06/2021   Renal colic on left side 04/22/2017   Stage 3 chronic kidney disease (HCC) 02/20/2020   Thyroid  goiter    10/17/23 CT: right thyorid goiter with substernal extension, benign FNA 07/07/23   Unspecified adverse effect of other drug, medicinal and biological substance(995.29) 02/17/2006   Urinary tract obstruction 02/06/2021    Objective: BP 105/60 (BP Location: Left  Arm, Paula Stout Position: Sitting)   Pulse 68   Temp 98 F (36.7 C) (Oral)   Resp 16   Ht 5\' 6"  (1.676 m)   Wt 158 lb (71.7 kg)   SpO2 98%   BMI 25.50 kg/m  General: Awake, appears stated age Heart: RRR, no LE edema, no bruits Lungs: CTAB, no rales, wheezes or rhonchi. No accessory muscle use Neuro: Gait is slow and cautious but improved from last week Mouth: MMD Psych: Age appropriate judgment and insight, normal affect and mood  Assessment and Plan: Hypokalemia - Plan: Basic metabolic panel with GFR  Unsteadiness - Plan: CBC  Orthostatic hypotension  Follow-up on labs. 2/3.  Stay hydrated.  Continue midodrine  10 mg 3 times daily.  We will keep her off of the Mounjaro  for now to hopefully increase her appetite in the coming weeks.  Wear compression stockings.  I sent a message to her cardiologist Dr. Gordan Latina inquiring about the metoprolol  and diltiazem .  She will send me a message in 1 week if she does not hear anything from me.  She has an appointment with the physical therapy team coming up. The Paula Stout voiced understanding and agreement to the plan.  I spent 42 minutes with the Paula Stout discussing the above plans in addition to reviewing her chart and reaching out to/coordinating care with specialists on the same day of the visit.  Shellie Dials Latimer, DO 02/01/24  4:47 PM

## 2024-02-01 NOTE — Progress Notes (Signed)
 [facility] Counselor/Therapist Progress Note  Patient ID: Paula Stout, MRN: [id number],    Date: 02/01/2024 Time Spent: 2 PM until 2:57 PM  57 minjutes. The patient consented to the video teletherapy and was located in her home during this session. She is aware it is the responsibility of the patient to secure confidentiality on her end of the session. The provider was in a private home  for the duration of this session.      Treatment Type: Individual Therapy  Reported Symptoms: Anxiety, depression  Mental Status Exam: Appearance:  Casual     Behavior: Appropriate  Motor: Normal  Speech/Language:  Normal Rate  Affect: Appropriate  Mood: normal  Thought process: normal  Thought content:   WNL  Sensory/Perceptual disturbances:   WNL  Orientation: oriented to person, place, time/date, situation, day of week, month of year, and year  Attention: Good  Concentration: Good  Memory: WNL  Fund of knowledge:  Good  Insight:   Good  Judgment:  Good  Impulse Control: Good   Risk Assessment: Danger to Self:  No Self-injurious Behavior: No Danger to Others: No Duty to Warn:no Physical Aggression / Violence:No  Access to Firearms a concern: No  Gang Involvement:No   Subjective: The patient came home from the hospital the day after our last session.  They did make some medication changes and she will be getting a monitor put on in a week or so.  She always blacked out this morning but said she had not had any other episodes since coming home Friday of last week.  She is meeting with her primary care physician this afternoon via video session.  She recognizes and hopes that her doctors would not recognize that they are not communicating very well and I encouraged her to stress that point with Dr. Armanda Bern when she meets with him.  There is still significantdysfunction in the home.  On discharge she was currently told with her daughter listing that she should not do things  bending over such as some of the dishwasher and yet her daughter has question what she is not doing more around the house and that she just got home from the hospital.  Her daughters asked her to borrow money when the patient basically has nothing to offer.  They have complained about her dog.  She continues to have conversations with her other children in the [place] area going to visit him in mid July.  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  The patient does contract for safety having no thoughts of hurting herself or anyone else. She does contract for safety having no thoughts of hurting herself or anyone else. Diagnosis: Generalized anxiety disorder, major depressive disorder, moderate, recurrent Plan: I will meet with the patient weekly via care agility.  Treatment plan: We will use cognitive behavioral therapy as well as person centered and supportive therapy in addition to elements of dialectical behavior therapy to help reduce the patient's anxiety and depression by at least 50% with a target date of October 06, 2023.  Goals for improving depression may include having less sadness as indicated by patient report and scores on the PHQ-9, have improved mood and return to a healthier level of functioning, identify causes including environment for depressed mood and learn ways to cope with depression especially those connected to  her medical issues.  Interventions include using cognitive behavioral therapy to explore and replace thoughts and behaviors.  We will look at how depression is experienced in day-to-day living and encouraged sharing of feelings.  We will encourage the use of coping skills for management of depressive symptoms.  Goals for reducing anxiety are to improve her ability to manage anxiety symptoms, better handle stress, identify causes for anxiety and explore ways to  lower it, resolve the core conflicts contributing to anxiety as well as manage thoughts and worrisome thinking contributing to feelings of anxiety.  Interventions will include providing education about anxiety, facilitate problem solution skills to help her identify options for resolving stress, teach coping skills for managing anxiety as well as mindfulness and communication in terms of family to reduce her stress level, use cognitive behavior therapy to identify and change anxiety provoking thought and behavior patterns as well as teach distress tolerance and mindfulness skills for alleviating anxiety. Progress: 30% progress notes were reviewed with a new extended target date of April 04, 2024.  Cecile Coder, [facility]                                         Cecile Coder, [facility]               Cecile Coder, [facility]               Cecile Coder, [facility]               Cecile Coder, [facility]

## 2024-02-01 NOTE — Patient Instructions (Signed)
 Give us  2-3 business days to get the results of your labs back.   Stay hydated.  Wear compression stockings.  Send me a message in a week if no word fro me about your metoprolol  and diltiazem .   Let us  know if you need anything.

## 2024-02-02 ENCOUNTER — Telehealth: Payer: Self-pay

## 2024-02-02 ENCOUNTER — Ambulatory Visit: Payer: Self-pay | Admitting: Family Medicine

## 2024-02-02 LAB — BASIC METABOLIC PANEL WITH GFR
BUN: 19 mg/dL (ref 6–23)
CO2: 23 meq/L (ref 19–32)
Calcium: 9.7 mg/dL (ref 8.4–10.5)
Chloride: 111 meq/L (ref 96–112)
Creatinine, Ser: 1.92 mg/dL — ABNORMAL HIGH (ref 0.40–1.20)
GFR: 26.86 mL/min — ABNORMAL LOW (ref >60.00)
Glucose, Bld: 96 mg/dL (ref 70–99)
Potassium: 4 meq/L (ref 3.5–5.1)
Sodium: 141 meq/L (ref 135–145)

## 2024-02-02 LAB — CBC
HCT: 34.8 % — ABNORMAL LOW (ref 36.0–46.0)
Hemoglobin: 11.4 g/dL — ABNORMAL LOW (ref 12.0–15.0)
MCHC: 32.7 g/dL (ref 30.0–36.0)
MCV: 94.1 fl (ref 78.0–100.0)
Platelets: 334 10*3/uL (ref 150.0–400.0)
RBC: 3.7 Mil/uL — ABNORMAL LOW (ref 3.87–5.11)
RDW: 14.7 % (ref 11.5–15.5)
WBC: 7.7 10*3/uL (ref 4.0–10.5)

## 2024-02-02 NOTE — Telephone Encounter (Signed)
 done

## 2024-02-03 ENCOUNTER — Other Ambulatory Visit (HOSPITAL_COMMUNITY): Payer: Self-pay

## 2024-02-03 NOTE — Telephone Encounter (Signed)
 Pharmacy Patient Advocate Encounter  Botox  One Portal verification has been completed.  Benefit Verification #:  [id number]   Dx Code: G43.709  J-code: Botox - Z6109 Does require PA Procedure code: 60454 Does require PA  Primary Insurance: [employer] Patient's remaining deductible: $0 Patient estimated coinsurance/copay for med: 20% / $0 Patient estimated coinsurance/copay for admin code: 0% / $20 *Benefit info changes as patient continues to use their insurance benefits*

## 2024-02-03 NOTE — Telephone Encounter (Signed)
 Pharmacy Patient Advocate Encounter   Received notification from Fax that prior authorization for CPT CODE [address] is required/requested.   Insurance verification completed.   The patient is insured through [employer] .   Per test claim: PA required; PA submitted to above mentioned insurance via Fax Key/confirmation #/EOC [id number] Status is pending

## 2024-02-05 ENCOUNTER — Emergency Department (HOSPITAL_BASED_OUTPATIENT_CLINIC_OR_DEPARTMENT_OTHER)
Admission: EM | Admit: 2024-02-05 | Discharge: 2024-02-06 | Disposition: A | Discharge status: Home / Self Care | Payer: Medicare (Managed Care) | Attending: Emergency Medicine | Admitting: Emergency Medicine

## 2024-02-05 ENCOUNTER — Emergency Department (HOSPITAL_BASED_OUTPATIENT_CLINIC_OR_DEPARTMENT_OTHER): Payer: Medicare (Managed Care)

## 2024-02-05 ENCOUNTER — Other Ambulatory Visit: Payer: Self-pay

## 2024-02-05 ENCOUNTER — Encounter (HOSPITAL_BASED_OUTPATIENT_CLINIC_OR_DEPARTMENT_OTHER): Payer: Self-pay | Admitting: Emergency Medicine

## 2024-02-05 DIAGNOSIS — R519 Headache, unspecified: Secondary | ICD-10-CM | POA: Insufficient documentation

## 2024-02-05 DIAGNOSIS — Z7901 Long term (current) use of anticoagulants: Secondary | ICD-10-CM | POA: Insufficient documentation

## 2024-02-05 DIAGNOSIS — R42 Dizziness and giddiness: Secondary | ICD-10-CM | POA: Diagnosis not present

## 2024-02-05 DIAGNOSIS — E1122 Type 2 diabetes mellitus with diabetic chronic kidney disease: Secondary | ICD-10-CM | POA: Diagnosis not present

## 2024-02-05 DIAGNOSIS — R079 Chest pain, unspecified: Secondary | ICD-10-CM | POA: Diagnosis not present

## 2024-02-05 DIAGNOSIS — N189 Chronic kidney disease, unspecified: Secondary | ICD-10-CM | POA: Insufficient documentation

## 2024-02-05 DIAGNOSIS — I129 Hypertensive chronic kidney disease with stage 1 through stage 4 chronic kidney disease, or unspecified chronic kidney disease: Secondary | ICD-10-CM | POA: Diagnosis not present

## 2024-02-05 DIAGNOSIS — R0789 Other chest pain: Secondary | ICD-10-CM | POA: Diagnosis not present

## 2024-02-05 LAB — CBC WITH DIFFERENTIAL/PLATELET
Abs Immature Granulocytes: 0.02 10*3/uL (ref 0.00–0.07)
Basophils Absolute: 0.1 10*3/uL (ref 0.0–0.1)
Basophils Relative: 1 %
Eosinophils Absolute: 0.5 10*3/uL (ref 0.0–0.5)
Eosinophils Relative: 5 %
HCT: 34.4 % — ABNORMAL LOW (ref 36.0–46.0)
Hemoglobin: 10.9 g/dL — ABNORMAL LOW (ref 12.0–15.0)
Immature Granulocytes: 0 %
Lymphocytes Relative: 33 %
Lymphs Abs: 2.8 10*3/uL (ref 0.7–4.0)
MCH: 30.7 pg (ref 26.0–34.0)
MCHC: 31.7 g/dL (ref 30.0–36.0)
MCV: 96.9 fL (ref 80.0–100.0)
Monocytes Absolute: 0.8 10*3/uL (ref 0.1–1.0)
Monocytes Relative: 9 %
Neutro Abs: 4.4 10*3/uL (ref 1.7–7.7)
Neutrophils Relative %: 52 %
Platelets: 295 10*3/uL (ref 150–400)
RBC: 3.55 MIL/uL — ABNORMAL LOW (ref 3.87–5.11)
RDW: 15.5 % (ref 11.5–15.5)
WBC: 8.5 10*3/uL (ref 4.0–10.5)
nRBC: 0 % (ref 0.0–0.2)

## 2024-02-05 LAB — COMPREHENSIVE METABOLIC PANEL WITH GFR
ALT: 15 U/L (ref 0–44)
AST: 25 U/L (ref 15–41)
Albumin: 3 g/dL — ABNORMAL LOW (ref 3.5–5.0)
Alkaline Phosphatase: 57 U/L (ref 38–126)
Anion gap: 8 (ref 5–15)
BUN: 23 mg/dL (ref 8–23)
CO2: 21 mmol/L — ABNORMAL LOW (ref 22–32)
Calcium: 8.5 mg/dL — ABNORMAL LOW (ref 8.9–10.3)
Chloride: 115 mmol/L — ABNORMAL HIGH (ref 98–111)
Creatinine, Ser: 1.87 mg/dL — ABNORMAL HIGH (ref 0.44–1.00)
GFR, Estimated: 29 mL/min — ABNORMAL LOW (ref >60)
Glucose, Bld: 83 mg/dL (ref 70–99)
Potassium: 3.7 mmol/L (ref 3.5–5.1)
Sodium: 143 mmol/L (ref 135–145)
Total Bilirubin: 0.3 mg/dL (ref 0.0–1.2)
Total Protein: 5.1 g/dL — ABNORMAL LOW (ref 6.5–8.1)

## 2024-02-05 LAB — URINALYSIS, W/ REFLEX TO CULTURE (INFECTION SUSPECTED)
Bilirubin Urine: NEGATIVE
Glucose, UA: >=500 mg/dL — AB
Hgb urine dipstick: NEGATIVE
Ketones, ur: NEGATIVE mg/dL
Nitrite: NEGATIVE
Protein, ur: NEGATIVE mg/dL
Specific Gravity, Urine: >=1.03 (ref 1.005–1.030)
pH: 5.5 (ref 5.0–8.0)

## 2024-02-05 LAB — TROPONIN T, HIGH SENSITIVITY
Troponin T High Sensitivity: <15 ng/L (ref <19)
Troponin T High Sensitivity: <15 ng/L (ref <19)

## 2024-02-05 LAB — MAGNESIUM: Magnesium: 1.8 mg/dL (ref 1.7–2.4)

## 2024-02-05 MED ORDER — METOCLOPRAMIDE HCL 5 MG/ML IJ SOLN
10.0000 mg | Freq: Once | INTRAMUSCULAR | Status: AC
  Administered 2024-02-05: 10 mg via INTRAVENOUS
  Filled 2024-02-05: qty 2

## 2024-02-05 MED ORDER — DEXAMETHASONE SODIUM PHOSPHATE 10 MG/ML IJ SOLN
10.0000 mg | Freq: Once | INTRAMUSCULAR | Status: AC
  Administered 2024-02-05: 10 mg via INTRAVENOUS
  Filled 2024-02-05: qty 1

## 2024-02-05 MED ORDER — SODIUM CHLORIDE 0.9 % IV BOLUS
1000.0000 mL | Freq: Once | INTRAVENOUS | Status: AC
  Administered 2024-02-05: 1000 mL via INTRAVENOUS

## 2024-02-05 MED ORDER — DIPHENHYDRAMINE HCL 50 MG/ML IJ SOLN
25.0000 mg | Freq: Once | INTRAMUSCULAR | Status: AC
  Administered 2024-02-05: 25 mg via INTRAVENOUS
  Filled 2024-02-05: qty 1

## 2024-02-05 MED ORDER — PROMETHAZINE HCL 25 MG/ML IJ SOLN
INTRAMUSCULAR | Status: AC
  Administered 2024-02-05: 12.5 mg
  Filled 2024-02-05: qty 1

## 2024-02-05 MED ORDER — HYDROMORPHONE HCL 1 MG/ML IJ SOLN
0.5000 mg | Freq: Once | INTRAMUSCULAR | Status: AC
  Administered 2024-02-05: 0.5 mg via INTRAVENOUS
  Filled 2024-02-05: qty 1

## 2024-02-05 MED ORDER — SODIUM CHLORIDE 0.9 % IV SOLN
12.5000 mg | Freq: Four times a day (QID) | INTRAVENOUS | Status: DC | PRN
  Filled 2024-02-05: qty 0.5

## 2024-02-05 NOTE — ED Provider Notes (Signed)
 Paula Stout Provider Note   CSN: [id number] Arrival date & time: 02/05/24  1845     History  Chief Complaint  Patient presents with   Chest Pain   Migraine    Paula Stout is a 68 y.o. female history of A-fib on Eliquis , orthostatic hypotension, SVT status post ablation, CKD, diabetes here presenting with headache and chest pain.  Patient states that Paula Stout was just sitting and had sudden onset of substernal chest pain.  Patient states that Paula Stout was admitted to the hospital recently for similar symptoms and was found to have orthostatic hypotension due to polypharmacy.  Patient had her metoprolol  and Cardizem  discontinued.  Patient states that Paula Stout also has been having headaches for the last several days.  Patient does see neurology for that and Paula Stout took her Nurtec with minimal relief.  Patient denies any nausea or vomiting or fevers or chills or neck pain  The history is provided by the patient.       Home Medications Prior to Admission medications   Medication Sig Start Date End Date Taking? Authorizing Provider  acetaminophen  (TYLENOL ) 500 MG tablet Take 1,000 mg by mouth every 6 (six) hours as needed for mild pain.    [provider]  apixaban  (ELIQUIS ) 5 MG TABS tablet Take 1 tablet (5 mg total) by mouth 2 (two) times daily. 01/18/24   Jobe Mulder, DO  atorvastatin  (LIPITOR ) 80 MG tablet TAKE 1 TABLET DAILY 01/03/24   Jobe Mulder, DO  Baclofen  5 MG TABS Take 1 tablet (5 mg total) by mouth 2 (two) times daily as needed. Patient taking differently: Take 1 tablet by mouth 2 (two) times daily as needed (for muscle spasms). 01/23/24   Merriam Abbey, DO  blood glucose meter kit and supplies One Touch Ultra  Check blood sugars once daily Dx: E11.9 Patient taking differently: 1 each by Other route See admin instructions. One Touch Ultra  Check blood sugars once daily Dx: E11.9 10/01/21   Jobe Mulder, DO   botulinum toxin Type A  (BOTOX ) 200 units injection Inject 155 units IM into multiple site in the face,neck and head once every 90 days 01/26/24   Merriam Abbey, DO  budesonide -formoterol  (SYMBICORT ) 80-4.5 MCG/ACT inhaler Inhale 2 puffs into the lungs 2 (two) times daily. 08/11/23   Cobb, Mariah Shines, NP  buPROPion  (WELLBUTRIN  XL) 300 MG 24 hr tablet Take 1 tablet (300 mg total) by mouth daily. 10/19/23   Lincoln Renshaw, NP  Continuous Glucose Sensor (DEXCOM G7 SENSOR) MISC Check blood sugars as directed 01/19/24   Jobe Mulder, DO  EPINEPHrine  0.3 mg/0.3 mL IJ SOAJ injection Inject 0.3 mg into the muscle as needed for anaphylaxis. 12/09/23   Jobe Mulder, DO  escitalopram  (LEXAPRO ) 20 MG tablet Take 1 tablet (20 mg total) by mouth daily. 12/09/23   Lincoln Renshaw, NP  ezetimibe  (ZETIA ) 10 MG tablet TAKE 1 TABLET DAILY 12/26/23   Jobe Mulder, DO  FARXIGA  10 MG TABS tablet TAKE 1 TABLET DAILY Patient taking differently: Take 10 mg by mouth every evening. 08/09/23   Jobe Mulder, DO  fenofibrate  160 MG tablet Take 1 tablet (160 mg total) by mouth daily. Patient taking differently: Take 160 mg by mouth every evening. 09/21/23   Jobe Mulder, DO  fluticasone  (FLONASE ) 50 MCG/ACT nasal spray Place 2 sprays into both nostrils daily. 01/29/23   Yevette Hem, FNP  Fremanezumab -vfrm (AJOVY ) 225  MG/1.5ML SOAJ Inject 225 mg into the skin every 30 (thirty) days. 11/28/23   Merriam Abbey, DO  levocetirizine (XYZAL ) 5 MG tablet TAKE 1 TABLET EVERY EVENING 12/01/23   Wendling, Shellie Dials, DO  LORazepam  (ATIVAN ) 0.5 MG tablet Take 1 tablet (0.5 mg total) by mouth every 8 (eight) hours. Patient taking differently: Take 0.5 mg by mouth every 6 (six) hours as needed for anxiety. 10/19/23   Lincoln Renshaw, NP  Lurasidone  HCl 120 MG TABS Take 1 tablet (120 mg total) by mouth at bedtime. 10/19/23   Lincoln Renshaw, NP  magnesium  oxide (MAG-OX) 400 (240 Mg) MG tablet Take  400 mg by mouth daily.    [provider]  midodrine  (PROAMATINE ) 10 MG tablet Take 1 tablet (10 mg total) by mouth 3 (three) times daily. 01/25/24   Jobe Mulder, DO  Multiple Vitamins-Minerals (MULTIVITAMIN WITH MINERALS) tablet Take 1 tablet by mouth daily. Daily Diabetes Health Pack - MVI + chromium, fish oil + vit D, magnesium , vit C, alpha lipoic acid w/ green tea    [provider]  nitroGLYCERIN  (NITROSTAT ) 0.4 MG SL tablet Place 1 tablet (0.4 mg total) under the tongue every 5 (five) minutes as needed for chest pain. 03/16/23   Krasowski, Robert J, MD  ondansetron  (ZOFRAN ) 4 MG tablet Take 1 tablet (4 mg total) by mouth every 6 (six) hours. Patient taking differently: Take 4 mg by mouth every 8 (eight) hours as needed for nausea or vomiting. 01/09/24   Felicie Horning, PA-C  ONETOUCH VERIO test strip USE DAILY TO CHECK BLOOD SUGAR Patient taking differently: 1 each by Other route See admin instructions. Use daily to check blood sugar. 10/29/22   Jobe Mulder, DO  pantoprazole  (PROTONIX ) 40 MG tablet TAKE 1 TABLET DAILY 12/26/23   Jobe Mulder, DO  QUEtiapine  (SEROQUEL ) 300 MG tablet Take 1 tablet (300 mg total) by mouth at bedtime. 10/19/23   Lincoln Renshaw, NP  Rimegepant Sulfate  (NURTEC) 75 MG TBDP Take 1 tablet (75 mg total) by mouth as needed (take 1 tab at the earlist onset of a migraine. Max 1 tab in 24 hours). 04/04/23   Merriam Abbey, DO  topiramate  (TOPAMAX ) 50 MG tablet TAKE 3 TABLETS TWICE A DAY 08/23/23   Jobe Mulder, DO      Allergies    Bee venom, Onion, Tetanus-diphtheria toxoids td, Sumatriptan , Zolpidem , Asa [aspirin ], Iodine, Nsaids, Sulfa antibiotics, and Vancomycin    Review of Systems   Review of Systems  Cardiovascular:  Positive for chest pain.  All other systems reviewed and are negative.   Physical Exam Updated Vital Signs BP (!) 83/57 (BP Location: Right Arm)   Pulse 62   Temp 98.1 F (36.7 C)    Resp (!) 22   Ht 5\' 6"  (1.676 m)   Wt 71.7 kg   SpO2 100%   BMI 25.50 kg/m  Physical Exam Vitals and nursing note reviewed.  Constitutional:      Comments: Chronically ill and dehydrated  HENT:     Head: Normocephalic.  Eyes:     Extraocular Movements: Extraocular movements intact.     Pupils: Pupils are equal, round, and reactive to light.  Cardiovascular:     Rate and Rhythm: Normal rate and regular rhythm.     Heart sounds: Normal heart sounds.  Pulmonary:     Effort: Pulmonary effort is normal.     Breath sounds: Normal breath sounds.  Abdominal:  General: Bowel sounds are normal.     Palpations: Abdomen is soft.  Musculoskeletal:        General: Normal range of motion.     Cervical back: Normal range of motion and neck supple.  Skin:    General: Skin is warm.     Capillary Refill: Capillary refill takes less than 2 seconds.  Neurological:     General: No focal deficit present.     Mental Status: Paula Stout is alert and oriented to person, place, and time.     Comments: Cranial nerves II to XII intact.  Patient has normal strength and sensation bilateral arms and legs  Psychiatric:        Mood and Affect: Mood normal.     ED Results / Procedures / Treatments   Labs (all labs ordered are listed, but only abnormal results are displayed) Labs Reviewed  CBC WITH DIFFERENTIAL/PLATELET  COMPREHENSIVE METABOLIC PANEL WITH GFR  URINALYSIS, W/ REFLEX TO CULTURE (INFECTION SUSPECTED)  MAGNESIUM   TROPONIN T, HIGH SENSITIVITY    EKG EKG Interpretation Date/Time:  Sunday February 05 2024 19:01:57 EDT Ventricular Rate:  60 PR Interval:  180 QRS Duration:  94 QT Interval:  416 QTC Calculation: 416 R Axis:   -29  Text Interpretation: Sinus rhythm Borderline left axis deviation Borderline T wave abnormalities No significant change since last tracing Confirmed by Florette Hurry [phone]) on 02/05/2024 7:07:05 PM  Radiology No results found.  Procedures Procedures     Medications Ordered in ED Medications  sodium chloride  0.9 % bolus 1,000 mL (has no administration in time range)  metoCLOPramide  (REGLAN ) injection 10 mg (has no administration in time range)  diphenhydrAMINE  (BENADRYL ) injection 25 mg (has no administration in time range)  HYDROmorphone  (DILAUDID ) injection 0.5 mg (has no administration in time range)  dexamethasone  (DECADRON ) injection 10 mg (has no administration in time range)    ED Course/ Medical Decision Making/ A&P                                 Medical Decision Making Adisynn Suleiman is a 68 y.o. female here presenting with dizziness and headache and chest pain.  Chest pain is acute onset today and dizziness and headache has been ongoing for several weeks.  Patient was noted to be hypotensive on arrival.  Patient has history of orthostatic hypotension and is on midodrine .  Consider electrolyte abnormality versus ACS versus dehydration.  Patient is on Eliquis  already and I have low suspicion for PE.  Plan to get CBC and CMP and troponin x 2 and will give IV fluids and reassess.  11:16 PM Troponin negative x 2.  Labs showed creatinine of 1.8 which is baseline.  UA did not show any obvious UTI.  Chest x-ray is clear.  Patient is feeling better now.  Her blood pressure came up to 109/64 after bolus.  At this Stout, patient is stable for discharge.  Problems Addressed: Chest pain, unspecified type: acute illness or injury Nonintractable headache, unspecified chronicity pattern, unspecified headache type: acute illness or injury  Amount and/or Complexity of Data Reviewed Labs: ordered. Decision-making details documented in ED Course. Radiology: ordered and independent interpretation performed. Decision-making details documented in ED Course.  Risk Prescription drug management.    Final Clinical Impression(s) / ED Diagnoses Final diagnoses:  None    Rx / DC Orders ED Discharge Orders     None  Dalene Duck, MD 02/05/24 [phone]

## 2024-02-05 NOTE — ED Triage Notes (Signed)
 Pt c/o CP x 2 hrs (center, describes as pressure); c/o migraine since yesterday; pt pale, hypotensive; sts BP is always low; feels like she can't get her breath

## 2024-02-05 NOTE — Discharge Instructions (Addendum)
 As we discussed, your heart enzyme test is normal.  Your kidney function is unchanged from previous  I recommend you follow-up with your cardiologist and neurologist and continue current meds  Return to ER if you have worse headache or chest pain or shortness of breath

## 2024-02-06 ENCOUNTER — Other Ambulatory Visit: Payer: Self-pay | Admitting: Behavioral Health

## 2024-02-06 DIAGNOSIS — F3130 Bipolar disorder, current episode depressed, mild or moderate severity, unspecified: Secondary | ICD-10-CM

## 2024-02-06 DIAGNOSIS — F411 Generalized anxiety disorder: Secondary | ICD-10-CM

## 2024-02-06 NOTE — Telephone Encounter (Signed)
 Last filled at [facility]. Verify pharmacy.

## 2024-02-07 ENCOUNTER — Other Ambulatory Visit: Payer: Self-pay

## 2024-02-08 ENCOUNTER — Ambulatory Visit (HOSPITAL_BASED_OUTPATIENT_CLINIC_OR_DEPARTMENT_OTHER)
Admission: RE | Admit: 2024-02-08 | Discharge: 2024-02-08 | Disposition: A | Discharge status: Home / Self Care | Payer: Medicare (Managed Care) | Source: Ambulatory Visit | Attending: Acute Care | Admitting: Acute Care

## 2024-02-08 DIAGNOSIS — R911 Solitary pulmonary nodule: Secondary | ICD-10-CM | POA: Insufficient documentation

## 2024-02-09 ENCOUNTER — Ambulatory Visit (INDEPENDENT_AMBULATORY_CARE_PROVIDER_SITE_OTHER): Payer: Medicare (Managed Care) | Admitting: Behavioral Health

## 2024-02-09 ENCOUNTER — Encounter: Payer: Self-pay | Admitting: Behavioral Health

## 2024-02-09 DIAGNOSIS — F411 Generalized anxiety disorder: Secondary | ICD-10-CM

## 2024-02-09 DIAGNOSIS — F331 Major depressive disorder, recurrent, moderate: Secondary | ICD-10-CM | POA: Diagnosis not present

## 2024-02-09 NOTE — Progress Notes (Signed)
 [facility] Counselor/Therapist Progress Note  Patient ID: Paula Stout, MRN: [id number],    Date: 02/09/2024 Time Spent: 3 PM until 3:56 PM  56 minjutes. The patient consented to the video teletherapy and was located in her home during this session. She is aware it is the responsibility of the patient to secure confidentiality on her end of the session. The provider was in a private home  for the duration of this session.      Treatment Type: Individual Therapy  Reported Symptoms: Anxiety, depression  Mental Status Exam: Appearance:  Casual     Behavior: Appropriate  Motor: Normal  Speech/Language:  Normal Rate  Affect: Appropriate  Mood: normal  Thought process: normal  Thought content:   WNL  Sensory/Perceptual disturbances:   WNL  Orientation: oriented to person, place, time/date, situation, day of week, month of year, and year  Attention: Good  Concentration: Good  Memory: WNL  Fund of knowledge:  Good  Insight:   Good  Judgment:  Good  Impulse Control: Good   Risk Assessment: Danger to Self:  No Self-injurious Behavior: No Danger to Others: No Duty to Warn:no Physical Aggression / Violence:No  Access to Firearms a concern: No  Gang Involvement:No   Subjective: The past few days have been difficult for the patient.  She had 2 episodes where she passed out or nearly passed out.  At one point in time she was able to slide down the door and sit on the floor.  In the second episode he felt it coming on and tried sit on the stool but passed out and hit the floor thankfully not hurting herself.  She used the phone or watch to call her daughter to help her get up.  She also had a migraine a couple of days ago for which she had to go to the emergency room for.  She has a Dexcom meter to monitor her blood sugar which will send daily reports as a way of trying to figure out what is going on and will have a heart monitor installed next week to see if they can figure  out why her heart rate is dropping so much.  She did say that the last couple of days have seen an improvement in her mood.  We did start to look at how she could do some things to help her feel more productive and be a distraction.  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  It feels overwhelming to her and her husband used to help her with that so that makes it more difficult.  She estimates it would take about 2 hours to get it all done so I encouraged her to take about 15 minutes/day and then stop whether she felt she could get going or not and do that on a regular basis.  She also has a hard time getting her laundry down stairs because she cannot lift it.  Her daughter did not offer to come get it for her but did say she could toss a downstairs, come down the stairs slowly and then collected again.  The patient does contract for safety having no thoughts of hurting herself or anyone else. She does contract for safety having no thoughts of hurting herself or anyone else. Diagnosis: Generalized anxiety disorder, major depressive disorder, moderate, recurrent Plan: I will meet with the patient weekly via care agility.  Treatment plan: We will use cognitive behavioral therapy  as well as person centered and supportive therapy in addition to elements of dialectical behavior therapy to help reduce the patient's anxiety and depression by at least 50% with a target date of October 06, 2023.  Goals for improving depression may include having less sadness as indicated by patient report and scores on the PHQ-9, have improved mood and return to a healthier level of functioning, identify causes including environment for depressed mood and learn ways to cope with depression especially those connected to her medical issues.  Interventions include using cognitive behavioral therapy to explore and replace thoughts and behaviors.  We will look at how depression is experienced  in day-to-day living and encouraged sharing of feelings.  We will encourage the use of coping skills for management of depressive symptoms.  Goals for reducing anxiety are to improve her ability to manage anxiety symptoms, better handle stress, identify causes for anxiety and explore ways to lower it, resolve the core conflicts contributing to anxiety as well as manage thoughts and worrisome thinking contributing to feelings of anxiety.  Interventions will include providing education about anxiety, facilitate problem solution skills to help her identify options for resolving stress, teach coping skills for managing anxiety as well as mindfulness and communication in terms of family to reduce her stress level, use cognitive behavior therapy to identify and change anxiety provoking thought and behavior patterns as well as teach distress tolerance and mindfulness skills for alleviating anxiety. Progress: 30% progress notes were reviewed with a new extended target date of April 04, 2024.  Cecile Coder, [facility]                                         Cecile Coder, [facility]               Cecile Coder, [facility]               Cecile Coder, [facility]               Cecile Coder, [facility]               Cecile Coder, [facility]

## 2024-02-10 NOTE — Telephone Encounter (Signed)
 Pharmacy Patient Advocate Encounter  Received notification from [employer] that Prior Authorization for CPT CODE 09811 has been APPROVED from 6.20.25 to 12.17.25   PA #/Case ID/Reference #: [id number]

## 2024-02-11 NOTE — Progress Notes (Unsigned)
  Electrophysiology Office Note:   Date:  02/13/2024  ID:  Paula Stout, DOB 08/27/1956, MRN [id number]  Primary Cardiologist: Ralene Burger, MD Primary Heart Failure: None Electrophysiologist: Nohealani Medinger Cort[place] Ding, MD      History of Present Illness:   Paula Stout is a 68 y.o. female with h/o atrial fibrillation, SVT seen today for routine electrophysiology followup.   Since last being seen in our clinic the patient reports continued fatigue and low blood pressures.  She gets dizzy quite a bit when she stands.  She also has been not able to walk in straight lines due to her level of dizziness.  She has no chest pain and no shortness of breath.  She has been to the hospital multiple times with dizziness.  Her heart rate also gets fast, up to 110 bpm.  She does not think that she has had any arrhythmias.  she denies chest pain, palpitations, dyspnea, PND, orthopnea, nausea, vomiting, dizziness, syncope, edema, weight gain, or early satiety.   Review of systems complete and found to be negative unless listed in HPI.   EP Information / Studies Reviewed:    EKG is not ordered today. EKG from 02/05/2024 reviewed which showed sinus rhythm        Risk Assessment/Calculations:    CHA2DS2-VASc Score = 3   This indicates a 3.2% annual risk of stroke. The patient's score is based upon: CHF History: 0 HTN History: 0 Diabetes History: 1 Stroke History: 0 Vascular Disease History: 0 Age Score: 1 Gender Score: 1          Physical Exam:   VS:  BP (!) 98/58 (BP Location: Left Arm, Patient Position: Sitting, Cuff Size: Normal)   Pulse 88   Ht 5\' 6"  (1.676 m)   Wt 155 lb (70.3 kg)   SpO2 99%   BMI 25.02 kg/m    Wt Readings from Last 3 Encounters:  02/13/24 155 lb (70.3 kg)  02/05/24 158 lb (71.7 kg)  02/01/24 158 lb (71.7 kg)     GEN: Well nourished, well developed in no acute distress NECK: No JVD; No carotid bruits CARDIAC: Regular rate and rhythm, no murmurs, rubs,  gallops RESPIRATORY:  Clear to auscultation without rales, wheezing or rhonchi  ABDOMEN: Soft, non-tender, non-distended EXTREMITIES:  No edema; No deformity   ASSESSMENT AND PLAN:    1.  Paroxysmal atrial fibrillation: Post ablation 10/27/2022.  No further obvious episodes.  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  No obvious recurrence.  3.  Secondary hypercoagulable state: On Eliquis   4.  Recurrent syncope: Appears orthostatic in nature.  She has been drinking quite a bit of fluids.  I have also asked her to salt load, adding salt to her foods and using electrolyte powders.  I have also suggested compression stockings and abdominal binders.  She [place] Wanner try this and see if this improves her symptoms.  I do not think that she has an indication for ILR implant.  She does have an Apple Watch and recorded EKGs with rapid heart rates.  Follow up with EP APP in 6 months  Signed, Kyrra Prada Cortland Ding, MD

## 2024-02-13 ENCOUNTER — Encounter: Payer: Self-pay | Admitting: Cardiology

## 2024-02-13 ENCOUNTER — Ambulatory Visit: Payer: Medicare (Managed Care) | Attending: Cardiology | Admitting: Cardiology

## 2024-02-13 VITALS — BP 98/58 | HR 88 | Ht 66.0 in | Wt 155.0 lb

## 2024-02-13 DIAGNOSIS — I951 Orthostatic hypotension: Secondary | ICD-10-CM | POA: Diagnosis not present

## 2024-02-13 DIAGNOSIS — D6869 Other thrombophilia: Secondary | ICD-10-CM | POA: Diagnosis not present

## 2024-02-13 DIAGNOSIS — I480 Paroxysmal atrial fibrillation: Secondary | ICD-10-CM | POA: Diagnosis not present

## 2024-02-13 DIAGNOSIS — I471 Supraventricular tachycardia, unspecified: Secondary | ICD-10-CM | POA: Diagnosis not present

## 2024-02-13 NOTE — Patient Instructions (Signed)
 Medication Instructions:  Your physician recommends that you continue on your current medications as directed. Please refer to the Current Medication list given to you today.  *If you need a refill on your cardiac medications before your next appointment, please call your pharmacy*  Lab Work: None ordered  Testing/Procedures: None ordered  Follow-Up: At [facility], you and your health needs are our priority.  As part of our continuing mission to provide you with exceptional heart care, our providers are all part of one team.  This team includes your primary Cardiologist (physician) and Advanced Practice Providers or APPs (Physician Assistants and Nurse Practitioners) who all work together to provide you with the care you need, when you need it.  Your next appointment:   3 month(s)  Provider:   Agatha Horsfall, MD     Thank you for choosing [facility]!!   Reece Cane, RN [phone]

## 2024-02-14 ENCOUNTER — Ambulatory Visit: Payer: Medicare (Managed Care) | Attending: Cardiology | Admitting: Cardiology

## 2024-02-14 ENCOUNTER — Encounter: Payer: Self-pay | Admitting: Cardiology

## 2024-02-14 VITALS — BP 90/60 | HR 72 | Ht 66.0 in | Wt 157.0 lb

## 2024-02-14 DIAGNOSIS — R55 Syncope and collapse: Secondary | ICD-10-CM | POA: Diagnosis not present

## 2024-02-14 DIAGNOSIS — E782 Mixed hyperlipidemia: Secondary | ICD-10-CM

## 2024-02-14 DIAGNOSIS — I482 Chronic atrial fibrillation, unspecified: Secondary | ICD-10-CM | POA: Diagnosis not present

## 2024-02-14 NOTE — Progress Notes (Signed)
 Cardiology Office Note:    Date:  02/14/2024   ID:  Paula Stout, DOB [birth year], MRN [id number]  PCP:  Jobe Mulder, DO  Cardiologist:  Ralene Burger, MD    Referring MD: Jobe Mulder*   Chief Complaint  Patient presents with   Loss of Consciousness   Dizziness   elevated HR    History of Present Illness:    Paula Stout is a 68 y.o. female  past medical history significant for supraventricular tachycardia, paroxysmal atrial fibrillation type 2 diabetes dyslipidemia.  She comes to my office today to talk about the syncopal episodes.  Still get those episodes she said yesterday she got up in the morning she went to the kitchen she put some food for her dog to microwave to warm it up then she put the food on the plate on the floor and she became dizzy when she got up she completely passed out.  It looks like every single time she get dizzy and passing out that happen when she standing up or getting up.  Look more orthostatic than anything else.  Denies have any chest pain no palpitations no dizziness.  She was seen by my colleague Dr. Encarnacion Harris for consideration for implantable loop recorder but very appropriately and that idea was abandoned.  Again from description she gave us  look like this is orthostatic.  Past Medical History:  Diagnosis Date   Adhesive capsulitis of left shoulder 12/16/2016   Asthma without status asthmaticus 02/06/2021   At risk for falls 02/04/2019   Atrial fibrillation (HCC) 09/01/2014   Last Assessment & Plan:  Formatting of this note might be different from the original. A:  Chronic.  Sinus rhythm at this time.  States she was told this during admission at [facility] in the past. P:  On baby aspirin  at home will continue.  Low dose metoprolol  started.   Bipolar 1 disorder, depressed, moderate (HCC) 02/18/2020   Capsulitis of left shoulder 10/20/2021   Chronic anticoagulation 02/20/2020   Chronic atrial fibrillation (HCC) 02/18/2020    Chronic headache 10/11/2013   Chronic interstitial cystitis 12/28/2005   Chronic migraine without aura, intractable, without status migrainosus 06/24/2017   Colon cancer screening 04/15/2016   Last Assessment & Plan:  Formatting of this note might be different from the original. Will schedule for colonoscopy   Depression, recurrent (HCC) 02/18/2020   Diabetes mellitus (HCC)    Dyslipidemia 02/20/2020   GAD (generalized anxiety disorder) 02/18/2020   GERD (gastroesophageal reflux disease) 11/05/2021   H/O chest pain 07/31/2021   History of atrial fibrillation 06/24/2017   History of posttraumatic stress disorder (PTSD) 10/07/2016   Hyperosmolarity due to secondary diabetes mellitus (HCC) 02/06/2021   Impaired mobility and activities of daily living 11/14/2015   Irritable bowel syndrome (IBS)    Junctional tachycardia (HCC) 11/05/2021   Kidney stone 02/06/2021   Migraine without aura, not refractory 02/06/2021   Mixed hyperlipidemia 04/06/2021   Last Assessment & Plan:  Formatting of this note might be different from the original. 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 Controlled   Refractory migraine with aura 02/06/2021   Refractory migraine without aura 02/06/2021   Renal colic on  left side 04/22/2017   Stage 3 chronic kidney disease (HCC) 02/20/2020   Thyroid  goiter    10/17/23 CT: right thyorid goiter with substernal extension, benign FNA 07/07/23   Unspecified adverse effect of other drug, medicinal and biological substance(995.29) 02/17/2006   Urinary tract obstruction 02/06/2021    Past Surgical History:  Procedure Laterality Date   ABDOMINAL  HYSTERECTOMY     Fibroids   ATRIAL FIBRILLATION ABLATION     ATRIAL FIBRILLATION ABLATION N/A 10/27/2022   Procedure: ATRIAL FIBRILLATION ABLATION;  Surgeon: Lei Pump, MD;  Location: [facility] INVASIVE CV LAB;  Service: Cardiovascular;  Laterality: N/A;   BRONCHIAL BIOPSY  11/08/2023   Procedure: BRONCHOSCOPY, WITH BIOPSY;  Surgeon: Denson Flake, MD;  Location: [facility] ENDOSCOPY;  Service: Pulmonary;;   BRONCHIAL BRUSHINGS  11/08/2023   Procedure: BRONCHOSCOPY, WITH BRUSH BIOPSY;  Surgeon: Denson Flake, MD;  Location: [facility] ENDOSCOPY;  Service: Pulmonary;;   BRONCHIAL NEEDLE ASPIRATION BIOPSY  11/08/2023   Procedure: BRONCHOSCOPY, WITH NEEDLE ASPIRATION BIOPSY;  Surgeon: Denson Flake, MD;  Location: [facility] ENDOSCOPY;  Service: Pulmonary;;   BRONCHIAL WASHINGS  11/08/2023   Procedure: IRRIGATION, BRONCHUS;  Surgeon: Denson Flake, MD;  Location: [facility] ENDOSCOPY;  Service: Pulmonary;;   CARDIAC CATHETERIZATION     CARDIOVERSION     CESAREAN SECTION     KNEE SURGERY     LEFT HEART CATH AND CORONARY ANGIOGRAPHY N/A 03/04/2020   Procedure: LEFT HEART CATH AND CORONARY ANGIOGRAPHY;  Surgeon: Arty Binning, MD;  Location: [facility] INVASIVE CV LAB;  Service: Cardiovascular;  Laterality: N/A;   ROTATOR CUFF REPAIR     SVT ABLATION N/A 05/04/2023   Procedure: SVT ABLATION;  Surgeon: Lei Pump, MD;  Location: [facility] INVASIVE CV LAB;  Service: Cardiovascular;  Laterality: N/A;    Current Medications: Current Meds  Medication Sig   acetaminophen  (TYLENOL ) 500 MG tablet Take 1,000 mg by mouth every 6 (six) hours as needed for mild pain.   apixaban  (ELIQUIS ) 5 MG TABS tablet Take 1 tablet (5 mg total) by mouth 2 (two) times daily.   atorvastatin  (LIPITOR ) 80 MG tablet TAKE 1 TABLET DAILY   Baclofen  5 MG TABS Take 1 tablet (5 mg total) by mouth 2 (two) times daily as needed. (Patient taking differently: Take 1 tablet by mouth 2 (two) times daily as needed (for muscle spasms).)   blood glucose meter kit and  supplies One Touch Ultra  Check 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 One Touch Ultra  Check blood sugars once daily Dx: E11.9)   botulinum toxin Type A  (BOTOX ) 200 units injection Inject 155 units IM into multiple site in the face,neck and head once every 90 days   budesonide -formoterol  (SYMBICORT ) 80-4.5 MCG/ACT inhaler Inhale 2 puffs into the lungs 2 (two) times daily.   buPROPion  (WELLBUTRIN  XL) 300 MG 24 hr tablet Take 1 tablet (300 mg total) by mouth daily.   Continuous Glucose Sensor (DEXCOM G7 SENSOR) MISC Check blood sugars as directed   EPINEPHrine  0.3 mg/0.3 mL IJ SOAJ injection Inject 0.3 mg into the muscle as needed for anaphylaxis.   escitalopram  (LEXAPRO ) 20 MG tablet Take 1 tablet (20 mg total) by mouth daily.   ezetimibe  (ZETIA ) 10 MG tablet TAKE 1 TABLET DAILY   FARXIGA  10 MG TABS tablet TAKE 1 TABLET DAILY (Patient taking differently: Take 10 mg by mouth every evening.)   fenofibrate  160 MG tablet Take 1 tablet (  160 mg total) by mouth daily. (Patient taking differently: Take 160 mg by mouth every evening.)   fluticasone  (FLONASE ) 50 MCG/ACT nasal spray Place 2 sprays into both nostrils daily.   Fremanezumab -vfrm (AJOVY ) 225 MG/1.5ML SOAJ Inject 225 mg into the skin every 30 (thirty) days.   levocetirizine (XYZAL ) 5 MG tablet TAKE 1 TABLET EVERY EVENING   LORazepam  (ATIVAN ) 0.5 MG tablet TAKE ONE TABLET BY MOUTH EVERY 8 HOURS   Lurasidone  HCl 120 MG TABS Take 1 tablet (120 mg total) by mouth at bedtime.   magnesium  oxide (MAG-OX) 400 (240 Mg) MG tablet Take 400 mg by mouth daily.   metoprolol  succinate (TOPROL -XL) 50 MG 24 hr tablet Take 50 mg by mouth daily.   midodrine  (PROAMATINE ) 10 MG tablet Take 1 tablet (10 mg total) by mouth 3 (three) times daily.   MOUNJARO  7.5 MG/0.5ML Pen Inject 7.5 mg into the skin once a week.   Multiple Vitamins-Minerals (MULTIVITAMIN WITH MINERALS) tablet Take 1 tablet by mouth  daily. Daily Diabetes Health Pack - MVI + chromium, fish oil + vit D, magnesium , vit C, alpha lipoic acid w/ green tea   nitroGLYCERIN  (NITROSTAT ) 0.4 MG SL tablet Place 1 tablet (0.4 mg total) under the tongue every 5 (five) minutes as needed for chest pain.   ondansetron  (ZOFRAN ) 4 MG tablet Take 1 tablet (4 mg total) by mouth every 6 (six) hours. (Patient taking differently: Take 4 mg by mouth every 8 (eight) hours as needed for nausea or vomiting.)   ONETOUCH VERIO test strip USE DAILY TO CHECK BLOOD SUGAR (Patient taking differently: 1 each by Other route See admin instructions. Use daily to check blood sugar.)   pantoprazole  (PROTONIX ) 40 MG tablet TAKE 1 TABLET DAILY   QUEtiapine  (SEROQUEL ) 300 MG tablet Take 1 tablet (300 mg total) by mouth at bedtime.   Rimegepant Sulfate  (NURTEC) 75 MG TBDP Take 1 tablet (75 mg total) by mouth as needed (take 1 tab at the earlist onset of a migraine. Max 1 tab in 24 hours).   topiramate  (TOPAMAX ) 50 MG tablet TAKE 3 TABLETS TWICE A DAY     Allergies:   Bee venom, Onion, Tetanus-diphtheria toxoids td, Sumatriptan , Zolpidem , Asa [aspirin ], Iodine, Nsaids, Sulfa antibiotics, and Vancomycin   Social History   Socioeconomic History   Marital status: Widowed    Spouse name: Not on file   Number of children: 6   Years of education: Not on file   Highest education level: Associate degree: academic program  Occupational History   Occupation: not employed  Tobacco Use   Smoking status: Never    Passive exposure: Past   Smokeless tobacco: Never   Tobacco comments:    Never smoke 07/15/22  Vaping Use   Vaping status: Never Used  Substance and Sexual Activity   Alcohol use: Never    Comment: light social   Drug use: Never   Sexual activity: Not Currently  Other Topics Concern   Not on file  Social History Narrative   Lives in [place] with daughter and son-in-law.  Sews clothing in free time as hobby.    Social Drivers of Research scientist (physical sciences) Strain: Low Risk  (10/11/2023)   Overall Financial Resource Strain (CARDIA)    Difficulty of Paying Living Expenses: Not very hard  Recent Concern: Financial Resource Strain - Medium Risk (07/24/2023)   Overall Financial Resource Strain (CARDIA)    Difficulty of Paying Living Expenses: Somewhat hard  Food Insecurity: No Food  Insecurity (01/26/2024)   Hunger Vital Sign    Worried About Running Out of Food in the Last Year: Never true    Ran Out of Food in the Last Year: Never true  Transportation Needs: No Transportation Needs (01/26/2024)   PRAPARE - Administrator, Civil Service (Medical): No    Lack of Transportation (Non-Medical): No  Physical Activity: Inactive (10/11/2023)   Exercise Vital Sign    Days of Exercise per Week: 0 days    Minutes of Exercise per Session: 0 min  Stress: Stress Concern Present (10/11/2023)   [employer] of Occupational Health - Occupational Stress Questionnaire    Feeling of Stress : To some extent  Social Connections: Socially Isolated (01/26/2024)   Social Connection and Isolation Panel [NHANES]    Frequency of Communication with Friends and Family: Three times a week    Frequency of Social Gatherings with Friends and Family: Never    Attends Religious Services: Never    Database administrator or Organizations: No    Attends Banker Meetings: Never    Marital Status: Widowed     Family History: The patient's family history includes Bipolar disorder in her daughter and son; Migraines in her daughter, daughter, and son. She was adopted. ROS:   Please see the history of present illness.    All 14 point review of systems negative except as described per history of present illness  EKGs/Labs/Other Studies Reviewed:    EKG Interpretation Date/Time:  Tuesday February 14 2024 16:11:38 EDT Ventricular Rate:  73 PR Interval:  174 QRS Duration:  88 QT Interval:  394 QTC Calculation: 434 R Axis:   -36  Text  Interpretation: Normal sinus rhythm Left axis deviation Nonspecific T wave abnormality When compared with ECG of 05-Feb-2024 19:01, PREVIOUS ECG IS PRESENT Confirmed by Ralene Burger [phone]) on 02/14/2024 4:15:45 PM    Recent Labs: 05/15/2023: B Natriuretic Peptide 62.1 01/11/2024: TSH 0.85 02/05/2024: ALT 15; BUN 23; Creatinine, Ser 1.87; Hemoglobin 10.9; Magnesium  1.8; Platelets 295; Potassium 3.7; Sodium 143  Recent Lipid Panel    Component Value Date/Time   CHOL 121 11/28/2023 1117   CHOL 141 02/10/2021 0914   TRIG 164.0 (H) 11/28/2023 1117   HDL 31.90 (L) 11/28/2023 1117   HDL 26 (L) 02/10/2021 0914   CHOLHDL 4 11/28/2023 1117   VLDL 32.8 11/28/2023 1117   LDLCALC 56 11/28/2023 1117   LDLCALC 86 02/10/2021 0914    Physical Exam:    VS:  BP 90/60 (BP Location: Right Arm, Patient Position: Sitting)   Pulse 72   Ht 5\' 6"  (1.676 m)   Wt 157 lb (71.2 kg)   SpO2 99%   BMI 25.34 kg/m     Wt Readings from Last 3 Encounters:  02/14/24 157 lb (71.2 kg)  02/13/24 155 lb (70.3 kg)  02/05/24 158 lb (71.7 kg)     GEN:  Well nourished, well developed in no acute distress HEENT: Normal NECK: No JVD; No carotid bruits LYMPHATICS: No lymphadenopathy CARDIAC: RRR, no murmurs, no rubs, no gallops RESPIRATORY:  Clear to auscultation without rales, wheezing or rhonchi  ABDOMEN: Soft, non-tender, non-distended MUSCULOSKELETAL:  No edema; No deformity  SKIN: Warm and dry LOWER EXTREMITIES: no swelling NEUROLOGIC:  Alert and oriented x 3 PSYCHIATRIC:  Normal affect   ASSESSMENT:    1. Syncope and collapse   2. Syncope, unspecified syncope type   3. Mixed hyperlipidemia   4. Chronic atrial fibrillation (HCC)  PLAN:    In order of problems listed above:  Recurrent episode of syncope I suspect orthostatic with some hypotension she is already on midodrine , she is drinking some fluid I encouraged her to drink more fluid, also show her on [place] which she can buy some salt tablets  and starting salt tablets starting 1 g a day going up to higher dosages of sodium and see if that helps.  She does wear elastic stockings but does only down below the knee not sufficient not tight enough I told her she need to get better elastic stockings I told her about started to have a nurse for that she can be measured and appropriate elastic stocking can be given to her. Mixed dyslipidemia stable continue present management. Atrial fibrillation paroxysmal continue monitoring anticoagulated   Medication Adjustments/Labs and Tests Ordered: Current medicines are reviewed at length with the patient today.  Concerns regarding medicines are outlined above.  Orders Placed This Encounter  Procedures   EKG 12-Lead   Medication changes: No orders of the defined types were placed in this encounter.   Signed, Manfred Seed, MD, [facility] 02/14/2024 4:53 PM    [facility]

## 2024-02-14 NOTE — Patient Instructions (Addendum)
 Medication Instructions:   Salt tablets as directed   Lab Work: None Ordered If you have labs (blood work) drawn today and your tests are completely normal, you will receive your results only by: MyChart Message (if you have MyChart) OR A paper copy in the mail If you have any lab test that is abnormal or we need to change your treatment, we will call you to review the results.   Testing/Procedures: None Ordered   Follow-Up: At [facility], you and your health needs are our priority.  As part of our continuing mission to provide you with exceptional heart care, we have created designated Provider Care Teams.  These Care Teams include your primary Cardiologist (physician) and Advanced Practice Providers (APPs -  Physician Assistants and Nurse Practitioners) who all work together to provide you with the care you need, when you need it.  We recommend signing up for the patient portal called "MyChart".  Sign up information is provided on this After Visit Summary.  MyChart is used to connect with patients for Virtual Visits (Telemedicine).  Patients are able to view lab/test results, encounter notes, upcoming appointments, etc.  Non-urgent messages can be sent to your provider as well.   To learn more about what you can do with MyChart, go to [email or web address].    Your next appointment:   2 month(s)  The format for your next appointment:   In Person  Provider:   Ralene Burger, MD    Other Instructions NA

## 2024-02-15 ENCOUNTER — Ambulatory Visit (INDEPENDENT_AMBULATORY_CARE_PROVIDER_SITE_OTHER): Payer: Medicare (Managed Care) | Admitting: Behavioral Health

## 2024-02-15 ENCOUNTER — Encounter: Payer: Self-pay | Admitting: Behavioral Health

## 2024-02-15 DIAGNOSIS — F411 Generalized anxiety disorder: Secondary | ICD-10-CM | POA: Diagnosis not present

## 2024-02-15 DIAGNOSIS — F319 Bipolar disorder, unspecified: Secondary | ICD-10-CM | POA: Diagnosis not present

## 2024-02-15 NOTE — Progress Notes (Signed)
 [facility] Counselor/Therapist Progress Note  Patient ID: Paula Stout, MRN: [id number],    Date: 02/15/2024 Time Spent: 2:01 PM until 2:55 PM  54 minutes. The patient consented to the video teletherapy and was located in her home during this session. She is aware it is the responsibility of the patient to secure confidentiality on her end of the session. The provider was in a private home  for the duration of this session.      Treatment Type: Individual Therapy  Reported Symptoms: Anxiety, depression  Mental Status Exam: Appearance:  Casual     Behavior: Appropriate  Motor: Normal  Speech/Language:  Normal Rate  Affect: Appropriate  Mood: normal  Thought process: normal  Thought content:   WNL  Sensory/Perceptual disturbances:   WNL  Orientation: oriented to person, place, time/date, situation, day of week, month of year, and year  Attention: Good  Concentration: Good  Memory: WNL  Fund of knowledge:  Good  Insight:   Good  Judgment:  Good  Impulse Control: Good   Risk Assessment: Danger to Self:  No Self-injurious Behavior: No Danger to Others: No Duty to Warn:no Physical Aggression / Violence:No  Access to Firearms a concern: No  Gang Involvement:No   Subjective: The patient had more episodes over the past week especially last weekend where she was dizzy and 1 time passed out.  Even going to one of her visits she had to stop but her daughter was with her and put her in a wheelchair as soon as they got into the building.  One of the doctors is convinced that is related to blood pressure but they do not know if the blood pressure causing heart events or heart events causing lower blood pressure.  They did not put a monitor on him and specifically there were going to and then the cardiologist to question why the other specialist had not done that.  The cardiologist says she is not drinking enough water especially with electrolytes but she says that has a bad  taste so he suggested that she take salt tablets and drink at least 16 ounces of water a minimum of 4 times per day.  She is trying to drink as much as she can and stop around 8:00 so she will be up all night because that is when a lot of the episodes have occurred as when she has gotten up to go to the bathroom in the middle of the night.  She did do my homework and that she went to a lot of old insurance and otherwise paperwork and got rid of things.  She got rid of close that she cannot wear any more.  She did order to bands to put fabric and [employer] dog close and but said they were big enough so she is going this week to get bigger bands and work on those projects also.  She says that helps to have a sense of purpose and see that something is getting accomplish what to keep her busy.  She is a little bit busier work for the next few weeks because of events that her son's company is providing security for her.  The patient does contract for safety having no thoughts of hurting herself or anyone else. She does contract for safety having no thoughts of hurting herself or anyone else. Diagnosis: Generalized anxiety disorder, major depressive disorder, moderate, recurrent Plan: I will meet with the patient weekly via care agility.  Treatment plan: We will use cognitive  behavioral therapy as well as person centered and supportive therapy in addition to elements of dialectical behavior therapy to help reduce the patient's anxiety and depression by at least 50% with a target date of October 06, 2023.  Goals for improving depression may include having less sadness as indicated by patient report and scores on the PHQ-9, have improved mood and return to a healthier level of functioning, identify causes including environment for depressed mood and learn ways to cope with depression especially those connected to her medical issues.  Interventions include using cognitive behavioral therapy to explore and replace  thoughts and behaviors.  We will look at how depression is experienced in day-to-day living and encouraged sharing of feelings.  We will encourage the use of coping skills for management of depressive symptoms.  Goals for reducing anxiety are to improve her ability to manage anxiety symptoms, better handle stress, identify causes for anxiety and explore ways to lower it, resolve the core conflicts contributing to anxiety as well as manage thoughts and worrisome thinking contributing to feelings of anxiety.  Interventions will include providing education about anxiety, facilitate problem solution skills to help her identify options for resolving stress, teach coping skills for managing anxiety as well as mindfulness and communication in terms of family to reduce her stress level, use cognitive behavior therapy to identify and change anxiety provoking thought and behavior patterns as well as teach distress tolerance and mindfulness skills for alleviating anxiety. Progress: 30% progress notes were reviewed with a new extended target date of April 04, 2024.  Cecile Coder, [facility]                                         Cecile Coder, [facility]               Cecile Coder, [facility]               Cecile Coder, [facility]               Cecile Coder, [facility]               Cecile Coder, [facility]               Cecile Coder, [facility]

## 2024-02-19 ENCOUNTER — Other Ambulatory Visit: Payer: Self-pay | Admitting: Family Medicine

## 2024-02-21 ENCOUNTER — Ambulatory Visit: Payer: Medicare (Managed Care) | Admitting: Dermatology

## 2024-02-22 ENCOUNTER — Ambulatory Visit (INDEPENDENT_AMBULATORY_CARE_PROVIDER_SITE_OTHER): Payer: Medicare (Managed Care) | Admitting: Behavioral Health

## 2024-02-22 ENCOUNTER — Encounter: Payer: Self-pay | Admitting: Behavioral Health

## 2024-02-22 ENCOUNTER — Other Ambulatory Visit: Payer: Self-pay | Admitting: Family Medicine

## 2024-02-22 DIAGNOSIS — F411 Generalized anxiety disorder: Secondary | ICD-10-CM

## 2024-02-22 DIAGNOSIS — F331 Major depressive disorder, recurrent, moderate: Secondary | ICD-10-CM | POA: Diagnosis not present

## 2024-02-22 NOTE — Progress Notes (Signed)
 [facility] Counselor/Therapist Progress Note  Patient ID: Paula Stout, MRN: [id number],    Date: 02/22/2024 Time Spent: 2:01 PM until 2:58 PM  58 minutes. The patient consented to the video teletherapy and was located in her home during this session. She is aware it is the responsibility of the patient to secure confidentiality on her end of the session. The provider was in a private home  for the duration of this session.      Treatment Type: Individual Therapy  Reported Symptoms: Anxiety, depression  Mental Status Exam: Appearance:  Casual     Behavior: Appropriate  Motor: Normal  Speech/Language:  Normal Rate  Affect: Appropriate  Mood: normal  Thought process: normal  Thought content:   WNL  Sensory/Perceptual disturbances:   WNL  Orientation: oriented to person, place, time/date, situation, day of week, month of year, and year  Attention: Good  Concentration: Good  Memory: WNL  Fund of knowledge:  Good  Insight:   Good  Judgment:  Good  Impulse Control: Good   Risk Assessment: Danger to Self:  No Self-injurious Behavior: No Danger to Others: No Duty to Warn:no Physical Aggression / Violence:No  Access to Firearms a concern: No  Gang Involvement:No   Subjective: The patient has not had any episodes where she blacked out but has had several where she became very dizzy but thankfully was able to catch herself or sit down before it became overwhelming.  She is wearing a monitor so she is seeing what ratings are in terms of blood pressure blood sugar levels etc.  One of her doctors encouraged her to drink more water so she is drinking 8 ounces of water multiple times a day as well as taking sodium chloride  tablets because he felt her salt content was too low and that could be contributing to some of the episodes.  She had to cancel a dermatology appointment recently because she felt too dizzy to drive.  On Monday night of this week she had a migraine and  felt she could not drive to the hospital but her daughter said she did not feel well enough to take the patient so took the patient at least 2 days to start to feel better because she could not go to the hospital to get the treatment that normally shortens the duration and it intensity of the migraine.  She is still hesitant about going up and down the stairs.  There was an episode where her dog went down the stairs too quickly and tripped rolling down the stairs and at least temporarily injured his leg.  She had to go downstairs quickly was left her winded and not feeling well.  That did eventually resolve itself but she limits when and how often she goes downstairs but she has to go downstairs to refill her water.  She waits in the morning of 10 or 1030 when the big dogs are putting their crates so she does not have to worry about that stressing she and her small dog.  I encouraged her to try something different such as going downstairs closer to desk so that she could spend some time outside getting out of her room as well as allowing her dog some more freedom which she agreed to do. The patient does contract for safety having no thoughts of hurting herself or anyone else. She does contract for safety having no thoughts of hurting herself or anyone else. Diagnosis: Generalized anxiety disorder, major depressive disorder, moderate, recurrent Plan:  I will meet with the patient weekly via care agility.  Treatment plan: We will use cognitive behavioral therapy as well as person centered and supportive therapy in addition to elements of dialectical behavior therapy to help reduce the patient's anxiety and depression by at least 50% with a target date of October 06, 2023.  Goals for improving depression may include having less sadness as indicated by patient report and scores on the PHQ-9, have improved mood and return to a healthier level of functioning, identify causes including environment for depressed mood  and learn ways to cope with depression especially those connected to her medical issues.  Interventions include using cognitive behavioral therapy to explore and replace thoughts and behaviors.  We will look at how depression is experienced in day-to-day living and encouraged sharing of feelings.  We will encourage the use of coping skills for management of depressive symptoms.  Goals for reducing anxiety are to improve her ability to manage anxiety symptoms, better handle stress, identify causes for anxiety and explore ways to lower it, resolve the core conflicts contributing to anxiety as well as manage thoughts and worrisome thinking contributing to feelings of anxiety.  Interventions will include providing education about anxiety, facilitate problem solution skills to help her identify options for resolving stress, teach coping skills for managing anxiety as well as mindfulness and communication in terms of family to reduce her stress level, use cognitive behavior therapy to identify and change anxiety provoking thought and behavior patterns as well as teach distress tolerance and mindfulness skills for alleviating anxiety. Progress: 30% progress notes were reviewed with a new extended target date of April 04, 2024.  Cecile Coder, [facility]                                         Cecile Coder, [facility]               Cecile Coder, [facility]               Cecile Coder, [facility]               Cecile Coder, [facility]               Cecile Coder, [facility]               Cecile Coder, [facility]               Cecile Coder, [facility]

## 2024-02-23 ENCOUNTER — Ambulatory Visit: Payer: Medicare (Managed Care) | Attending: Family Medicine

## 2024-02-24 ENCOUNTER — Ambulatory Visit: Payer: Medicare (Managed Care) | Admitting: Neurology

## 2024-02-24 DIAGNOSIS — G43709 Chronic migraine without aura, not intractable, without status migrainosus: Secondary | ICD-10-CM | POA: Diagnosis not present

## 2024-02-24 MED ORDER — ONABOTULINUMTOXINA 100 UNITS IJ SOLR
200.0000 [IU] | Freq: Once | INTRAMUSCULAR | Status: AC
  Administered 2024-02-24: 155 [IU] via INTRAMUSCULAR

## 2024-02-24 NOTE — Progress Notes (Signed)

## 2024-02-24 NOTE — Telephone Encounter (Signed)
 Called to inform patient that Prescription for Levalbuterol  was not prescribed by any of our providers.She states she's going to check to find out who prescribed medication.

## 2024-02-27 ENCOUNTER — Emergency Department (HOSPITAL_BASED_OUTPATIENT_CLINIC_OR_DEPARTMENT_OTHER): Payer: Medicare (Managed Care)

## 2024-02-27 ENCOUNTER — Other Ambulatory Visit: Payer: Self-pay | Admitting: Family Medicine

## 2024-02-27 ENCOUNTER — Encounter (HOSPITAL_BASED_OUTPATIENT_CLINIC_OR_DEPARTMENT_OTHER): Payer: Self-pay | Admitting: *Deleted

## 2024-02-27 ENCOUNTER — Other Ambulatory Visit: Payer: Self-pay

## 2024-02-27 ENCOUNTER — Emergency Department (HOSPITAL_BASED_OUTPATIENT_CLINIC_OR_DEPARTMENT_OTHER)
Admission: EM | Admit: 2024-02-27 | Discharge: 2024-02-28 | Disposition: A | Discharge status: Home / Self Care | Payer: Medicare (Managed Care) | Attending: Emergency Medicine | Admitting: Emergency Medicine

## 2024-02-27 ENCOUNTER — Encounter: Payer: Self-pay | Admitting: Neurology

## 2024-02-27 DIAGNOSIS — R079 Chest pain, unspecified: Secondary | ICD-10-CM | POA: Insufficient documentation

## 2024-02-27 DIAGNOSIS — R519 Headache, unspecified: Secondary | ICD-10-CM | POA: Diagnosis present

## 2024-02-27 DIAGNOSIS — R0789 Other chest pain: Secondary | ICD-10-CM | POA: Diagnosis not present

## 2024-02-27 DIAGNOSIS — G43809 Other migraine, not intractable, without status migrainosus: Secondary | ICD-10-CM | POA: Insufficient documentation

## 2024-02-27 DIAGNOSIS — Z7901 Long term (current) use of anticoagulants: Secondary | ICD-10-CM | POA: Insufficient documentation

## 2024-02-27 LAB — BASIC METABOLIC PANEL WITH GFR
Anion gap: 14 (ref 5–15)
BUN: 15 mg/dL (ref 8–23)
CO2: 18 mmol/L — ABNORMAL LOW (ref 22–32)
Calcium: 9.6 mg/dL (ref 8.9–10.3)
Chloride: 110 mmol/L (ref 98–111)
Creatinine, Ser: 1.73 mg/dL — ABNORMAL HIGH (ref 0.44–1.00)
GFR, Estimated: 32 mL/min — ABNORMAL LOW (ref >60)
Glucose, Bld: 97 mg/dL (ref 70–99)
Potassium: 3.6 mmol/L (ref 3.5–5.1)
Sodium: 143 mmol/L (ref 135–145)

## 2024-02-27 LAB — CBC
HCT: 35.2 % — ABNORMAL LOW (ref 36.0–46.0)
Hemoglobin: 11.6 g/dL — ABNORMAL LOW (ref 12.0–15.0)
MCH: 30.5 pg (ref 26.0–34.0)
MCHC: 33 g/dL (ref 30.0–36.0)
MCV: 92.6 fL (ref 80.0–100.0)
Platelets: 331 10*3/uL (ref 150–400)
RBC: 3.8 MIL/uL — ABNORMAL LOW (ref 3.87–5.11)
RDW: 16.3 % — ABNORMAL HIGH (ref 11.5–15.5)
WBC: 6.2 10*3/uL (ref 4.0–10.5)
nRBC: 0 % (ref 0.0–0.2)

## 2024-02-27 LAB — TROPONIN T, HIGH SENSITIVITY
Troponin T High Sensitivity: 40 ng/L — ABNORMAL HIGH (ref <19)
Troponin T High Sensitivity: 38 ng/L — ABNORMAL HIGH (ref <19)

## 2024-02-27 MED ORDER — HYDROMORPHONE HCL 1 MG/ML IJ SOLN
1.0000 mg | Freq: Once | INTRAMUSCULAR | Status: AC
  Administered 2024-02-27: 1 mg via INTRAVENOUS
  Filled 2024-02-27: qty 1

## 2024-02-27 MED ORDER — DIPHENHYDRAMINE HCL 50 MG/ML IJ SOLN
25.0000 mg | Freq: Once | INTRAMUSCULAR | Status: AC
  Administered 2024-02-27: 25 mg via INTRAVENOUS
  Filled 2024-02-27: qty 1

## 2024-02-27 MED ORDER — DEXAMETHASONE SODIUM PHOSPHATE 10 MG/ML IJ SOLN
10.0000 mg | Freq: Once | INTRAMUSCULAR | Status: AC
  Administered 2024-02-27: 10 mg via INTRAVENOUS
  Filled 2024-02-27: qty 1

## 2024-02-27 MED ORDER — METOCLOPRAMIDE HCL 5 MG/ML IJ SOLN
10.0000 mg | Freq: Once | INTRAMUSCULAR | Status: AC
  Administered 2024-02-27: 10 mg via INTRAVENOUS
  Filled 2024-02-27: qty 2

## 2024-02-27 NOTE — ED Provider Notes (Signed)
 [place] [facility] AT [facility] Provider Note   CSN: [id number] Arrival date & time: 02/27/24  8079     Patient presents with: Chest Pain   Paula Stout is a 68 y.o. female.   HPI Patient reports she has both the chest pain and headache. Patient reports her headache is generalized and like a migraine.  Patient has had longstanding headaches and gets Botox  injections.  She reports after her last set of Botox  injections.  She thought her headaches are worse for a while.  Patient reports that she called her neurologist today and they said if she could get in by mid afternoon they would give her a migraine cocktail.  Patient reports that her daughter could not get home early enough and therefore could not go into her neurologist's office and was recommended to come to the emergency department for a migraine cocktail.  Patient had a list of the medication she gets with migraine which was Benadryl  Reglan  Dilaudid  and Decadron .  Patient reports she is also had chest pain tonight.  Patient feels like she is got a pressure and her heart rate goes up.  Patient reports she was seen by Dr. Inocencio and they do not want to put a loop monitor in at this point.  She reports she is supposed to follow-up in 6 more months.    Prior to Admission medications   Medication Sig Start Date End Date Taking? Authorizing Provider  acetaminophen  (TYLENOL ) 500 MG tablet Take 1,000 mg by mouth every 6 (six) hours as needed for mild pain.    [provider]  apixaban  (ELIQUIS ) 5 MG TABS tablet Take 1 tablet (5 mg total) by mouth 2 (two) times daily. 01/18/24   Frann Mabel Mt, DO  atorvastatin  (LIPITOR ) 80 MG tablet TAKE 1 TABLET DAILY 01/03/24   Frann Mabel Mt, DO  Baclofen  5 MG TABS Take 1 tablet (5 mg total) by mouth 2 (two) times daily as needed. Patient taking differently: Take 1 tablet by mouth 2 (two) times daily as needed (for muscle spasms). 01/23/24   Skeet Juliene SAUNDERS, DO  blood glucose meter kit and supplies One Touch Ultra  Check blood sugars once daily Dx: E11.9 Patient taking differently: 1 each by Other route See admin instructions. One Touch Ultra  Check blood sugars once daily Dx: E11.9 10/01/21   Frann Mabel Mt, DO  botulinum toxin Type A  (BOTOX ) 200 units injection Inject 155 units IM into multiple site in the face,neck and head once every 90 days 01/26/24   Skeet Juliene SAUNDERS, DO  budesonide -formoterol  (SYMBICORT ) 80-4.5 MCG/ACT inhaler Inhale 2 puffs into the lungs 2 (two) times daily. 08/11/23   Cobb, Comer GAILS, NP  buPROPion  (WELLBUTRIN  XL) 300 MG 24 hr tablet Take 1 tablet (300 mg total) by mouth daily. 10/19/23   Teresa Redell LABOR, NP  Continuous Glucose Sensor (DEXCOM G7 SENSOR) MISC Check blood sugars as directed 01/19/24   Frann Mabel Mt, DO  EPINEPHrine  0.3 mg/0.3 mL IJ SOAJ injection Inject 0.3 mg into the muscle as needed for anaphylaxis. 12/09/23   Frann Mabel Mt, DO  escitalopram  (LEXAPRO ) 20 MG tablet Take 1 tablet (20 mg total) by mouth daily. 12/09/23   Teresa Redell LABOR, NP  ezetimibe  (ZETIA ) 10 MG tablet TAKE 1 TABLET DAILY 12/26/23   Frann Mabel Mt, DO  FARXIGA  10 MG TABS tablet TAKE 1 TABLET DAILY Patient taking differently: Take 10 mg by mouth every evening. 08/09/23   Frann Mabel Mt,  DO  fenofibrate  160 MG tablet Take 1 tablet (160 mg total) by mouth daily. Patient taking differently: Take 160 mg by mouth every evening. 09/21/23   Frann Mabel Mt, DO  fluticasone  (FLONASE ) 50 MCG/ACT nasal spray Place 2 sprays into both nostrils daily. 01/29/23   Lavell Bari LABOR, FNP  Fremanezumab -vfrm (AJOVY ) 225 MG/1.5ML SOAJ Inject 225 mg into the skin every 30 (thirty) days. 11/28/23   Skeet Juliene SAUNDERS, DO  levocetirizine (XYZAL ) 5 MG tablet TAKE 1 TABLET EVERY EVENING 12/01/23   Wendling, Mabel Mt, DO  LORazepam  (ATIVAN ) 0.5 MG tablet TAKE ONE TABLET BY MOUTH EVERY 8 HOURS 02/07/24   Teresa Rogue A, NP   Lurasidone  HCl 120 MG TABS Take 1 tablet (120 mg total) by mouth at bedtime. 10/19/23   Teresa Rogue LABOR, NP  magnesium  oxide (MAG-OX) 400 (240 Mg) MG tablet Take 400 mg by mouth daily.    [provider]  midodrine  (PROAMATINE ) 10 MG tablet Take 1 tablet (10 mg total) by mouth 3 (three) times daily. 01/25/24   Frann Mabel Mt, DO  MOUNJARO  7.5 MG/0.5ML Pen INJECT THE CONTENTS OF ONE PEN UNDER THE SKIN WEEKLY ON THE SAME DAY EACH WEEK 02/27/24   Wendling, Mabel Mt, DO  Multiple Vitamins-Minerals (MULTIVITAMIN WITH MINERALS) tablet Take 1 tablet by mouth daily. Daily Diabetes Health Pack - MVI + chromium, fish oil + vit D, magnesium , vit C, alpha lipoic acid w/ green tea    [provider]  nitroGLYCERIN  (NITROSTAT ) 0.4 MG SL tablet Place 1 tablet (0.4 mg total) under the tongue every 5 (five) minutes as needed for chest pain. 03/16/23   Krasowski, Robert J, MD  ondansetron  (ZOFRAN ) 4 MG tablet Take 1 tablet (4 mg total) by mouth every 6 (six) hours. Patient taking differently: Take 4 mg by mouth every 8 (eight) hours as needed for nausea or vomiting. 01/09/24   Donnajean Lynwood DEL, PA-C  ONETOUCH VERIO test strip USE DAILY TO CHECK BLOOD SUGAR Patient taking differently: 1 each by Other route See admin instructions. Use daily to check blood sugar. 10/29/22   Frann Mabel Mt, DO  pantoprazole  (PROTONIX ) 40 MG tablet TAKE 1 TABLET DAILY 12/26/23   Frann Mabel Mt, DO  QUEtiapine  (SEROQUEL ) 300 MG tablet Take 1 tablet (300 mg total) by mouth at bedtime. 10/19/23   Teresa Rogue LABOR, NP  Rimegepant Sulfate  (NURTEC) 75 MG TBDP Take 1 tablet (75 mg total) by mouth as needed (take 1 tab at the earlist onset of a migraine. Max 1 tab in 24 hours). 04/04/23   Skeet Juliene SAUNDERS, DO  topiramate  (TOPAMAX ) 50 MG tablet TAKE 3 TABLETS TWICE A DAY 08/23/23   Wendling, Mabel Mt, DO    Allergies: Bee venom, Onion, Tetanus-diphtheria toxoids td, Sumatriptan , Zolpidem , Asa [aspirin ],  Iodine, Nsaids, Sulfa antibiotics, and Vancomycin    Review of Systems  Updated Vital Signs BP 127/73   Pulse 68   Temp 97.9 F (36.6 C)   Resp 14   SpO2 93%   Physical Exam Constitutional:      Comments: Alert nontoxic clear mental status.  No respiratory distress  HENT:     Mouth/Throat:     Pharynx: Oropharynx is clear.   Eyes:     Extraocular Movements: Extraocular movements intact.     Pupils: Pupils are equal, round, and reactive to light.    Cardiovascular:     Rate and Rhythm: Normal rate and regular rhythm.  Pulmonary:     Effort: Pulmonary effort  is normal.     Breath sounds: Normal breath sounds.  Abdominal:     General: There is no distension.     Palpations: Abdomen is soft.     Tenderness: There is no abdominal tenderness. There is no guarding.   Musculoskeletal:        General: No swelling or tenderness. Normal range of motion.     Right lower leg: No edema.     Left lower leg: No edema.   Skin:    General: Skin is warm and dry.   Neurological:     General: No focal deficit present.     Mental Status: She is oriented to person, place, and time.     Motor: No weakness.     Coordination: Coordination normal.   Psychiatric:     Comments: Patient is seen quite anxious at time of initial evaluation.  After treatment she is calm and pleasant.     (all labs ordered are listed, but only abnormal results are displayed) Labs Reviewed  BASIC METABOLIC PANEL WITH GFR - Abnormal; Notable for the following components:      Result Value   CO2 18 (*)    Creatinine, Ser 1.73 (*)    GFR, Estimated 32 (*)    All other components within normal limits  CBC - Abnormal; Notable for the following components:   RBC 3.80 (*)    Hemoglobin 11.6 (*)    HCT 35.2 (*)    RDW 16.3 (*)    All other components within normal limits  TROPONIN T, HIGH SENSITIVITY - Abnormal; Notable for the following components:   Troponin T High Sensitivity 40 (*)    All other  components within normal limits  TROPONIN T, HIGH SENSITIVITY - Abnormal; Notable for the following components:   Troponin T High Sensitivity 38 (*)    All other components within normal limits    EKG: EKG Interpretation Date/Time:  Monday February 27 2024 19:33:37 EDT Ventricular Rate:  94 PR Interval:  178 QRS Duration:  76 QT Interval:  486 QTC Calculation: 607 R Axis:   -26  Text Interpretation: NSR tremor artifact no sig change from previous When compared with ECG of 14-Feb-2024 16:11, PREVIOUS ECG IS PRESENT Confirmed by Armenta Canning [phone]) on 02/27/2024 11:28:28 PM  Radiology: DG Chest 2 View Result Date: 02/27/2024 CLINICAL DATA:  Chest pain EXAM: CHEST - 2 VIEW COMPARISON:  02/05/2024 FINDINGS: The lungs are symmetrically well expanded. Rounded opacity seen only on lateral examination within retrocardiac region is new from prior examination likely represents an artifact related to overlap of osseous and vascular shadows. Lungs are clear. No pneumothorax or pleural effusion. Cardiac size within normal limits. Pulmonary vascularity is normal. No acute bone abnormality. IMPRESSION: No active cardiopulmonary disease. Electronically Signed   By: Dorethia Molt M.D.   On: 02/27/2024 20:27     Procedures   Medications Ordered in the ED  metoCLOPramide  (REGLAN ) injection 10 mg (10 mg Intravenous Given 02/27/24 2119)  diphenhydrAMINE  (BENADRYL ) injection 25 mg (25 mg Intravenous Given 02/27/24 2119)  dexamethasone  (DECADRON ) injection 10 mg (10 mg Intravenous Given 02/27/24 2119)  HYDROmorphone  (DILAUDID ) injection 1 mg (1 mg Intravenous Given 02/27/24 2120)                                    Medical Decision Making Amount and/or Complexity of Data Reviewed Labs: ordered. Radiology: ordered.  Risk Prescription  drug management.   Patient presents as outlined.  She has a report of a bad headache.  Patient has longstanding history of migraines.  No neurologic symptoms present.   Well appearance.  I have reviewed EMR.  Patient has had multiple CT scans this year.  At this time with clear mental status and indolent waxing waning headache and did not feel that repeat CT scan needed.  Will treat with migraine cocktail.  Regarding chest pain will obtain lab work and chest x-ray.  Patient's vital signs are stable.  No hypertension, no dysrhythmia on the monitor.  Oxygen saturation 100%.  After treatment with migraine cocktail as per patient's report from what she usually gets, patient was treated with Reglan , Benadryl , Decadron  and Dilaudid .  She feels much better and appears stable for discharge.  Patient can follow-up with cardiology outpatient basis she is not exhibiting any hypotension or dysrhythmia.  Troponins are flat.     Final diagnoses:  Chest pain, unspecified type  Other migraine without status migrainosus, not intractable    ED Discharge Orders     None          Armenta Canning, MD 02/27/24 2353

## 2024-02-27 NOTE — Discharge Instructions (Signed)
 1.  Follow-up with your cardiologist and your neurologist as soon as possible for recheck. 2.  Continue all of your regular medications. 3.  Return to the emergency department if you have new worsening or concerning symptoms.

## 2024-02-27 NOTE — ED Notes (Signed)
 Pt notified nurse that she fell yesterday, hit her head, and lost consciousness. Dr. Armenta notified

## 2024-02-27 NOTE — ED Triage Notes (Signed)
 Pt c/o chest pressure since 7am with SOB. Also having migraine headache that started after receiving botox  injections. Taking midrodrine for hypotension. Hx of cardiac ablations in the past; compliant with Eliquis 

## 2024-02-28 NOTE — ED Notes (Signed)
 Discharge paperwork reviewed entirely with patient, including follow up care. Pain was under control. No prescriptions were called in, but all questions were addressed.  Pt verbalized understanding as well as all parties involved. No questions or concerns voiced at the time of discharge. No acute distress noted. Pt was encouraged to stay adequately hydrated and eat a healthy diet.   Pt ambulated out to PVA without incident or assistance.  Pt advised they will seek followup care with a specialist and followup with their PCP.   The pt was instructed to set up and/or review MyChart for their results; and was informed their Providers all have access to the information as well.

## 2024-02-29 ENCOUNTER — Encounter: Payer: Self-pay | Admitting: Behavioral Health

## 2024-02-29 ENCOUNTER — Ambulatory Visit (INDEPENDENT_AMBULATORY_CARE_PROVIDER_SITE_OTHER): Payer: Medicare (Managed Care) | Admitting: Behavioral Health

## 2024-02-29 DIAGNOSIS — F331 Major depressive disorder, recurrent, moderate: Secondary | ICD-10-CM

## 2024-02-29 DIAGNOSIS — F411 Generalized anxiety disorder: Secondary | ICD-10-CM | POA: Diagnosis not present

## 2024-02-29 DIAGNOSIS — F319 Bipolar disorder, unspecified: Secondary | ICD-10-CM

## 2024-02-29 NOTE — Progress Notes (Signed)
 [facility] Counselor/Therapist Progress Note  Patient ID: Paula Stout, MRN: [id number],    Date: 02/29/2024 Time Spent: 2:08 PM until 3:02 PM  54 minutes. The patient consented to the video teletherapy and was located in her home during this session. She is aware it is the responsibility of the patient to secure confidentiality on her end of the session. The provider was in a private home  for the duration of this session.      Treatment Type: Individual Therapy  Reported Symptoms: Anxiety, depression  Mental Status Exam: Appearance:  Casual     Behavior: Appropriate  Motor: Normal  Speech/Language:  Normal Rate  Affect: Appropriate  Mood: normal  Thought process: normal  Thought content:   WNL  Sensory/Perceptual disturbances:   WNL  Orientation: oriented to person, place, time/date, situation, day of week, month of year, and year  Attention: Good  Concentration: Good  Memory: WNL  Fund of knowledge:  Good  Insight:   Good  Judgment:  Good  Impulse Control: Good   Risk Assessment: Danger to Self:  No Self-injurious Behavior: No Danger to Others: No Duty to Warn:no Physical Aggression / Violence:No  Access to Firearms a concern: No  Gang Involvement:No   Subjective: The patient had to go to the hospital again 2 days ago because of migraines and chest pains.  The medication she had been using at home for 3 days were not bringing any relief.  She was in the emergency room for several hours getting home late.  She said that she was still exhausted yesterday but feels better today.  Still having some episodes having one just before he started the session where she felt as if her legs were going to give out  The past few weeks have been difficult for the patient.  She lost her mother and her husband in the same year and her father just before that.  It is a very difficult time of year for the patient and she recognizes that a lot of the depression and not  feeling well are part of that grief.  We spent most of the session processing her grief and remembering her husband for who he was.  We looked at how they met when they had children and how good their marriage was for 41 years.  He died of cancer and we processed what that experience was like for her going through that with him.  She says it feels good to talk about that and helps alleviate grief.  She does contract for safety having no thoughts of hurting herself or anyone else. Diagnosis: Generalized anxiety disorder, major depressive disorder, moderate, recurrent Plan: I will meet with the patient weekly via care agility.  Treatment plan: We will use cognitive behavioral therapy as well as person centered and supportive therapy in addition to elements of dialectical behavior therapy to help reduce the patient's anxiety and depression by at least 50% with a target date of October 06, 2023.  Goals for improving depression may include having less sadness as indicated by patient report and scores on the PHQ-9, have improved mood and return to a healthier level of functioning, identify causes including environment for depressed mood and learn ways to cope with depression especially those connected to her medical issues.  Interventions include using cognitive behavioral therapy to explore and replace thoughts and behaviors.  We will look at how depression is experienced in day-to-day living and encouraged sharing of feelings.  We will encourage the  use of coping skills for management of depressive symptoms.  Goals for reducing anxiety are to improve her ability to manage anxiety symptoms, better handle stress, identify causes for anxiety and explore ways to lower it, resolve the core conflicts contributing to anxiety as well as manage thoughts and worrisome thinking contributing to feelings of anxiety.  Interventions will include providing education about anxiety, facilitate problem solution skills to help her  identify options for resolving stress, teach coping skills for managing anxiety as well as mindfulness and communication in terms of family to reduce her stress level, use cognitive behavior therapy to identify and change anxiety provoking thought and behavior patterns as well as teach distress tolerance and mindfulness skills for alleviating anxiety. Progress: 30% progress notes were reviewed with a new extended target date of April 04, 2024.  Paula Stout, [facility]                                         Paula Stout, [facility]               Paula Stout, [facility]               Paula Stout, [facility]               Paula Stout, [facility]               Paula Stout, [facility]               Paula Stout, [facility]               Paula Stout, [facility]               Paula Stout, [facility]

## 2024-03-01 ENCOUNTER — Encounter: Payer: Self-pay | Admitting: Neurology

## 2024-03-01 ENCOUNTER — Other Ambulatory Visit: Payer: Self-pay | Admitting: Family Medicine

## 2024-03-05 ENCOUNTER — Encounter: Payer: Self-pay | Admitting: Family Medicine

## 2024-03-05 ENCOUNTER — Ambulatory Visit (INDEPENDENT_AMBULATORY_CARE_PROVIDER_SITE_OTHER): Payer: Medicare (Managed Care) | Admitting: Family Medicine

## 2024-03-05 VITALS — BP 110/68 | HR 103 | Temp 98.0°F | Resp 16 | Ht 66.0 in | Wt 151.0 lb

## 2024-03-05 DIAGNOSIS — M791 Myalgia, unspecified site: Secondary | ICD-10-CM

## 2024-03-05 NOTE — Patient Instructions (Addendum)
 Heat (pad or rice pillow in microwave) over affected area, 10-15 minutes twice daily.   Ice/cold pack over area for 10-15 min twice daily.  OK to take Tylenol  1000 mg (2 extra strength tabs) or 975 mg (3 regular strength tabs) every 6 hours as needed.  Give us  2-3 business days to get the results of your labs back.   Stop your Lipitor  for the next 2 weeks and send me a message with how you are doing.   Let us  know if you need anything.

## 2024-03-05 NOTE — Progress Notes (Signed)
 Chief Complaint  Patient presents with   Muscle Pain    Muscle Pain    Subjective: Patient is a 68 y.o. female here for muscle pains.  She is here with her daughter who helps with the history.  Over the past several days, the patient has had diffuse muscle pains.  No injury, recent illness, change in activity, fevers, medication changes, rashes, bruising, redness, swelling.  She does take Lipitor  80 mg daily.  Her hydration has not been very good lately.  Stress levels are relatively high.  She has been taking Percogesic's at home without significant relief.  Nothing else has been particularly helpful.  No neuro signs or symptoms.  Past Medical History:  Diagnosis Date   Adhesive capsulitis of left shoulder 12/16/2016   Asthma without status asthmaticus 02/06/2021   At risk for falls 02/04/2019   Atrial fibrillation (HCC) 09/01/2014   Last Assessment & Plan:  Formatting of this note might be different from the original. A:  Chronic.  Sinus rhythm at this time.  States she was told this during admission at [facility] in the past. P:  On baby aspirin  at home will continue.  Low dose metoprolol  started.   Bipolar 1 disorder, depressed, moderate (HCC) 02/18/2020   Capsulitis of left shoulder 10/20/2021   Chronic anticoagulation 02/20/2020   Chronic atrial fibrillation (HCC) 02/18/2020   Chronic headache 10/11/2013   Chronic interstitial cystitis 12/28/2005   Chronic migraine without aura, intractable, without status migrainosus 06/24/2017   Colon cancer screening 04/15/2016   Last Assessment & Plan:  Formatting of this note might be different from the original. Will schedule for colonoscopy   Depression, recurrent (HCC) 02/18/2020   Diabetes mellitus (HCC)    Dyslipidemia 02/20/2020   GAD (generalized anxiety disorder) 02/18/2020   GERD (gastroesophageal reflux disease) 11/05/2021   H/O chest pain 07/31/2021   History of atrial fibrillation 06/24/2017   History of posttraumatic stress  disorder (PTSD) 10/07/2016   Hyperosmolarity due to secondary diabetes mellitus (HCC) 02/06/2021   Impaired mobility and activities of daily living 11/14/2015   Irritable bowel syndrome (IBS)    Junctional tachycardia (HCC) 11/05/2021   Kidney stone 02/06/2021   Migraine without aura, not refractory 02/06/2021   Mixed hyperlipidemia 04/06/2021   Last Assessment & Plan:  Formatting of this note might be different from the original. 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 Controlled   Refractory migraine with aura 02/06/2021   Refractory migraine without aura 02/06/2021   Renal colic on left side 04/22/2017   Stage 3 chronic kidney disease (HCC) 02/20/2020   Thyroid  goiter    10/17/23 CT: right thyorid goiter with substernal extension, benign FNA 07/07/23   Unspecified adverse effect of other drug, medicinal and biological substance(995.29) 02/17/2006   Urinary tract obstruction 02/06/2021    Objective: BP 110/68 (BP Location: Left Arm, Patient Position: Sitting)   Pulse (!) 103   Temp 98 F (36.7 C) (Oral)   Resp 16   Ht 5' 6 (1.676 m)   Wt 151 lb (68.5 kg)   SpO2 98%   BMI 24.37 kg/m  General: Awake, appears stated age Heart: RRR, no LE edema Lungs: CTAB, no rales, wheezes  or rhonchi. No accessory muscle use Neuro: Gait is cautious, 5/5 strength throughout, DTRs equal and symmetric throughout, no clonus, no cerebellar signs MSK: TTP over the trapezius musculature, thoracic and lumbar paraspinal musculature.  No obvious deformity. Psych: Age appropriate judgment and insight, normal affect and mood  Assessment and Plan: Myalgia - Plan: CK (Creatine  Kinase), Comp Met (CMET), Sedimentation rate, CBC  Check above labs.  Suspect adverse reaction of chronic medication.  She will hold her Lipitor  for the next 2 weeks and improve hydration.  She will send me a message in 2 weeks with how she is doing.  Follow-up as originally scheduled. The patient voiced understanding and agreement to the plan.  Mabel [place], DO 03/05/24  2:56 PM

## 2024-03-06 ENCOUNTER — Ambulatory Visit: Payer: Self-pay | Admitting: Family Medicine

## 2024-03-06 LAB — COMPREHENSIVE METABOLIC PANEL WITH GFR
ALT: 224 U/L — ABNORMAL HIGH (ref 0–35)
AST: 322 U/L — ABNORMAL HIGH (ref 0–37)
Albumin: 3.6 g/dL (ref 3.5–5.2)
Alkaline Phosphatase: 50 U/L (ref 39–117)
BUN: 16 mg/dL (ref 6–23)
CO2: 21 meq/L (ref 19–32)
Calcium: 9.7 mg/dL (ref 8.4–10.5)
Chloride: 108 meq/L (ref 96–112)
Creatinine, Ser: 1.83 mg/dL — ABNORMAL HIGH (ref 0.40–1.20)
GFR: 28.44 mL/min — ABNORMAL LOW (ref >60.00)
Glucose, Bld: 93 mg/dL (ref 70–99)
Potassium: 4 meq/L (ref 3.5–5.1)
Sodium: 140 meq/L (ref 135–145)
Total Bilirubin: 1 mg/dL (ref 0.2–1.2)
Total Protein: 6 g/dL (ref 6.0–8.3)

## 2024-03-06 LAB — CK: Total CK: 3704 U/L — ABNORMAL HIGH (ref 7–177)

## 2024-03-06 LAB — CBC
HCT: 38 % (ref 36.0–46.0)
Hemoglobin: 12.1 g/dL (ref 12.0–15.0)
MCHC: 31.9 g/dL (ref 30.0–36.0)
MCV: 97.4 fl (ref 78.0–100.0)
Platelets: 341 10*3/uL (ref 150.0–400.0)
RBC: 3.9 Mil/uL (ref 3.87–5.11)
RDW: 18 % — ABNORMAL HIGH (ref 11.5–15.5)
WBC: 7.4 10*3/uL (ref 4.0–10.5)

## 2024-03-06 LAB — SEDIMENTATION RATE: Sed Rate: 6 mm/h (ref 0–30)

## 2024-03-07 ENCOUNTER — Encounter: Payer: Self-pay | Admitting: Behavioral Health

## 2024-03-07 ENCOUNTER — Encounter: Payer: Self-pay | Admitting: Family Medicine

## 2024-03-07 ENCOUNTER — Other Ambulatory Visit: Payer: Self-pay

## 2024-03-07 ENCOUNTER — Other Ambulatory Visit: Payer: Self-pay | Admitting: Family Medicine

## 2024-03-07 ENCOUNTER — Ambulatory Visit: Payer: Medicare (Managed Care) | Admitting: Behavioral Health

## 2024-03-07 DIAGNOSIS — F411 Generalized anxiety disorder: Secondary | ICD-10-CM | POA: Diagnosis not present

## 2024-03-07 DIAGNOSIS — F331 Major depressive disorder, recurrent, moderate: Secondary | ICD-10-CM

## 2024-03-07 DIAGNOSIS — I959 Hypotension, unspecified: Secondary | ICD-10-CM

## 2024-03-07 MED ORDER — METHOCARBAMOL 500 MG PO TABS: 500.0000 mg | ORAL_TABLET | Freq: Three times a day (TID) | ORAL | 0 refills | Status: DC | PRN

## 2024-03-07 MED ORDER — MIDODRINE HCL 10 MG PO TABS: 10.0000 mg | ORAL_TABLET | Freq: Three times a day (TID) | ORAL | 1 refills | Status: DC

## 2024-03-07 NOTE — Progress Notes (Signed)
 [facility] Counselor/Therapist Progress Note  Patient ID: Paula Stout, MRN: [id number],    Date: 03/07/2024 Time Spent: 2:00 PM until 2: 56 PM  56 minutes. The patient consented to the video teletherapy and was located in her home during this session. She is aware it is the responsibility of the patient to secure confidentiality on her end of the session. The provider was in a private home  for the duration of this session.      Treatment Type: Individual Therapy  Reported Symptoms: Anxiety, depression  Mental Status Exam: Appearance:  Casual     Behavior: Appropriate  Motor: Normal  Speech/Language:  Normal Rate  Affect: Appropriate  Mood: normal  Thought process: normal  Thought content:   WNL  Sensory/Perceptual disturbances:   WNL  Orientation: oriented to person, place, time/date, situation, day of week, month of year, and year  Attention: Good  Concentration: Good  Memory: WNL  Fund of knowledge:  Good  Insight:   Good  Judgment:  Good  Impulse Control: Good   Risk Assessment: Danger to Self:  No Self-injurious Behavior: No Danger to Others: No Duty to Warn:no Physical Aggression / Violence:No  Access to Firearms a concern: No  Gang Involvement:No   Subjective: The patient met with her primary care physician this week and he took her off of her cholesterol medication thinking that may be contributing to her weakness and difficulty with breathing.  She says that anymore just to walk across the room her legs feel weak and she has difficulty breathing.  Now with any muscle her body she feels pain when she stretches it out too far including legs and arms shoulders back stomach and he thinks the cholesterol medication may be contributing to that.  He asked her to give it a couple of weeks to see how being off of that medication works today was her first day without it.  She is concerned because she is supposed to leave to go to [place] to visit with  her other children leaving on July 15.  She also knows that mentally and emotionally she needs to be out there.  Relating more in her current environment in part because of the medical issues that other contributing factors include the fact that she does not feel like she can take her dog down there very much without one of the bigger dogs in the house being in her space.  She says she cannot sit down there to eat because the big dogs get right up to her plate and her daughter does not stop that.  Her dog barks at the bigger dogs to protect the food and the patient.  He continues to be very chaotic and unclean environment which he knows is not good for her but she also recognizes that she isolates in her room to avoid being downstairs.  I encouraged her to speak to her daughter in [place] about a possible permanent move to get her out of that environment. She does contract for safety having no thoughts of hurting herself or anyone else. Diagnosis: Generalized anxiety disorder, major depressive disorder, moderate, recurrent Plan: I will meet with the patient weekly via care agility.  Treatment plan: We will use cognitive behavioral therapy as well as person centered and supportive therapy in addition to elements of dialectical behavior therapy to help reduce the patient's anxiety and depression by at least 50% with a target date of October 06, 2023.  Goals for improving depression may  include having less sadness as indicated by patient report and scores on the PHQ-9, have improved mood and return to a healthier level of functioning, identify causes including environment for depressed mood and learn ways to cope with depression especially those connected to her medical issues.  Interventions include using cognitive behavioral therapy to explore and replace thoughts and behaviors.  We will look at how depression is experienced in day-to-day living and encouraged sharing of feelings.  We will encourage the  use of coping skills for management of depressive symptoms.  Goals for reducing anxiety are to improve her ability to manage anxiety symptoms, better handle stress, identify causes for anxiety and explore ways to lower it, resolve the core conflicts contributing to anxiety as well as manage thoughts and worrisome thinking contributing to feelings of anxiety.  Interventions will include providing education about anxiety, facilitate problem solution skills to help her identify options for resolving stress, teach coping skills for managing anxiety as well as mindfulness and communication in terms of family to reduce her stress level, use cognitive behavior therapy to identify and change anxiety provoking thought and behavior patterns as well as teach distress tolerance and mindfulness skills for alleviating anxiety. Progress: 30% progress notes were reviewed with a new extended target date of April 04, 2024.  Lorrene CHRISTELLA Hasten, [facility]                                         Lorrene CHRISTELLA Hasten, [facility]               Lorrene CHRISTELLA Hasten, [facility]               Lorrene CHRISTELLA Hasten, [facility]               Lorrene CHRISTELLA Hasten, [facility]               Lorrene CHRISTELLA Hasten, [facility]               Lorrene CHRISTELLA Hasten, [facility]               Lorrene CHRISTELLA Hasten, [facility]               Lorrene CHRISTELLA Hasten, [facility]               Lorrene CHRISTELLA Hasten, [facility]

## 2024-03-13 ENCOUNTER — Telehealth (HOSPITAL_BASED_OUTPATIENT_CLINIC_OR_DEPARTMENT_OTHER): Payer: Self-pay

## 2024-03-13 ENCOUNTER — Inpatient Hospital Stay: Payer: Medicare (Managed Care) | Admitting: Family Medicine

## 2024-03-13 NOTE — Telephone Encounter (Signed)
 Copied from CRM [id number]. Topic: Clinical - Lab/Test Results >> Mar 12, 2024  3:28 PM Joesph PARAS wrote: Reason for CRM: Patient is calling to inquire about CT results from 06/04. CT resulted 06/11 but does not appear to have been received, reviewed, or relayed by any clinical staff at our office. Patient requesting call back with information on results if at all possible.

## 2024-03-13 NOTE — Progress Notes (Signed)
 [facility] Quality Team Note  Name: Paula Stout Date of Birth: 07/05/1956 MRN: [id number] Date: 03/13/2024  [facility] Quality Team has reviewed this patient's chart, please see recommendations below:  [facility] Quality Other; (CHART REVIEWED FOR [facility] MEASURE. ABSTRACTED PATIENT ENGAGEMENT FOR HOSPITALIZATION 1/11-1/13/2025)

## 2024-03-14 ENCOUNTER — Encounter: Payer: Self-pay | Admitting: Family Medicine

## 2024-03-14 ENCOUNTER — Other Ambulatory Visit: Payer: Self-pay | Admitting: Family Medicine

## 2024-03-14 MED ORDER — ATORVASTATIN CALCIUM 10 MG PO TABS: 10.0000 mg | ORAL_TABLET | Freq: Every day | ORAL | 3 refills | Status: DC

## 2024-03-15 ENCOUNTER — Encounter: Payer: Self-pay | Admitting: Behavioral Health

## 2024-03-15 ENCOUNTER — Other Ambulatory Visit: Payer: Self-pay | Admitting: Family Medicine

## 2024-03-15 ENCOUNTER — Ambulatory Visit: Payer: Medicare (Managed Care) | Admitting: Behavioral Health

## 2024-03-15 DIAGNOSIS — F411 Generalized anxiety disorder: Secondary | ICD-10-CM

## 2024-03-15 DIAGNOSIS — I959 Hypotension, unspecified: Secondary | ICD-10-CM

## 2024-03-15 DIAGNOSIS — F331 Major depressive disorder, recurrent, moderate: Secondary | ICD-10-CM | POA: Diagnosis not present

## 2024-03-15 NOTE — Telephone Encounter (Signed)
 Copied from CRM [id number]. Topic: Clinical - Medication Refill >> Mar 15, 2024  4:55 PM Zebedee SAUNDERS wrote: Medication: midodrine  (PROAMATINE ) 10 MG tablet  Has the patient contacted their pharmacy? Yes (Agent: If no, request that the patient contact the pharmacy for the refill. If patient does not wish to contact the pharmacy document the reason why and proceed with request.) (Agent: If yes, when and what did the pharmacy advise?)Pharmacy need script and PCP approval.   This is the patient's preferred pharmacy:   [facility] - [place], [place] - [address] [place] [address] Phone: [phone] Fax: [phone]  Is this the correct pharmacy for this prescription? Yes If no, delete pharmacy and type the correct one.   Has the prescription been filled recently? Yes  Is the patient out of the medication? Yes  Has the patient been seen for an appointment in the last year OR does the patient have an upcoming appointment? Yes  Can we respond through MyChart? Yes  Agent: Please be advised that Rx refills may take up to 3 business days. We ask that you follow-up with your pharmacy.

## 2024-03-15 NOTE — Progress Notes (Signed)
 [facility] Counselor/Therapist Progress Note  Patient ID: Paula Stout, MRN: [id number],    Date: 03/15/2024 Time Spent: 3 PM until 3:51 PM, 51 minutes. The patient consented to the video teletherapy and was located in her home during this session. She is aware it is the responsibility of the patient to secure confidentiality on her end of the session. The provider was in a private home  for the duration of this session.      Treatment Type: Individual Therapy  Reported Symptoms: Anxiety, depression  Mental Status Exam: Appearance:  Casual     Behavior: Appropriate  Motor: Normal  Speech/Language:  Normal Rate  Affect: Appropriate  Mood: normal  Thought process: normal  Thought content:   WNL  Sensory/Perceptual disturbances:   WNL  Orientation: oriented to person, place, time/date, situation, day of week, month of year, and year  Attention: Good  Concentration: Good  Memory: WNL  Fund of knowledge:  Good  Insight:   Good  Judgment:  Good  Impulse Control: Good   Risk Assessment: Danger to Self:  No Self-injurious Behavior: No Danger to Others: No Duty to Warn:no Physical Aggression / Violence:No  Access to Firearms a concern: No  Gang Involvement:No   Subjective: This going the patient fell down the stairs and is now very sore.  She says she does not think she broke anything but will go to the doctor if pain does not get better.  She has not not her head as well as her shoulder and her wrist.  Her daughter was not home when her son-in-law her fall and did help her get up because she said she noticed as soon as she started down the stairs and her legs were not working which she lowered into an same flight when she tried to get up she could not feel coordinated and not to do that.  She did say that since coming off the atorvastatin  the stiffness in her arms and legs has gone away and she is thankful.  There have not been any other medication changes since our  last session.  She is leaving on Tuesday, July 15 his go spend a month at her daughter's house in [place].  She received use last week that she had been accepted into the senior apartment complex in [place]  and can get in as early as the middle of August.  After spending a month in [place] her daughter is going to fly to [place]  a few days after to help her pack or call her and drive with her out to [place].  She is excited as she has been on the waiting list for a long time and also has an anxiety saying she is never lived alone and knows that will come with some anxiety.  She also is excited that she will be about 10 minutes from both of her daughters and 30 minutes from one of her sons.  She will also live very close to public areas such as pressure stores etc.  She knows that she will have to get new doctors but reframe that in a positive way saying it might be good to get a new set of eyes on all that is going on.  She knows that she will have much more support with her daughters in [place] and told her daughter here that she is living with who took the news fairly well.  She does contract for safety having no thoughts of hurting herself  or anyone else. Diagnosis: Generalized anxiety disorder, major depressive disorder, moderate, recurrent Plan: I will meet with the patient weekly via care agility.  Treatment plan: We will use cognitive behavioral therapy as well as person centered and supportive therapy in addition to elements of dialectical behavior therapy to help reduce the patient's anxiety and depression by at least 50% with a target date of October 06, 2023.  Goals for improving depression may include having less sadness as indicated by patient report and scores on the PHQ-9, have improved mood and return to a healthier level of functioning, identify causes including environment for depressed mood and learn ways to cope with depression especially  those connected to her medical issues.  Interventions include using cognitive behavioral therapy to explore and replace thoughts and behaviors.  We will look at how depression is experienced in day-to-day living and encouraged sharing of feelings.  We will encourage the use of coping skills for management of depressive symptoms.  Goals for reducing anxiety are to improve her ability to manage anxiety symptoms, better handle stress, identify causes for anxiety and explore ways to lower it, resolve the core conflicts contributing to anxiety as well as manage thoughts and worrisome thinking contributing to feelings of anxiety.  Interventions will include providing education about anxiety, facilitate problem solution skills to help her identify options for resolving stress, teach coping skills for managing anxiety as well as mindfulness and communication in terms of family to reduce her stress level, use cognitive behavior therapy to identify and change anxiety provoking thought and behavior patterns as well as teach distress tolerance and mindfulness skills for alleviating anxiety. Progress: 30% progress notes were reviewed with a new extended target date of April 04, 2024.  Lorrene CHRISTELLA Hasten, [facility]                                         Lorrene CHRISTELLA Hasten, [facility]               Lorrene CHRISTELLA Hasten, [facility]               Lorrene CHRISTELLA Hasten, [facility]               Lorrene CHRISTELLA Hasten, [facility]               Lorrene CHRISTELLA Hasten, [facility]               Lorrene CHRISTELLA Hasten, [facility]               Lorrene CHRISTELLA Hasten, [facility]               Lorrene CHRISTELLA Hasten, [facility]               Lorrene CHRISTELLA Hasten, [facility]               Lorrene CHRISTELLA Hasten, [facility]

## 2024-03-16 ENCOUNTER — Telehealth: Payer: Self-pay

## 2024-03-16 NOTE — Telephone Encounter (Signed)
 Received call from triage line- Pt states she was transferred multiple times, she has an appt scheduled w/ PCP Monday, 7/14 for a fall she had yesterday, 7/10. She informed that she became weak, dizzy and fell down approximately 14 steps, hitting her head(she is on Eliquis  bid, she reports that she is having a migraine type headache). Instructed given her injury and being on anticoagulation I would recommend being seen in ED for evaluation. Pt verbalized understanding and stated she would go.

## 2024-03-19 ENCOUNTER — Telehealth: Payer: Self-pay | Admitting: Pharmacist

## 2024-03-19 ENCOUNTER — Ambulatory Visit: Payer: Medicare (Managed Care) | Admitting: Family Medicine

## 2024-03-19 ENCOUNTER — Other Ambulatory Visit (HOSPITAL_COMMUNITY): Payer: Self-pay

## 2024-03-19 NOTE — Telephone Encounter (Signed)
 Pharmacy Patient Advocate Encounter  Received notification from [employer] that Prior Authorization for Nurtec 75MG  dispersible tablets has been APPROVED from 02/18/2024 to 03/19/2025   PA #/Case ID/Reference #: [id number]

## 2024-03-19 NOTE — Telephone Encounter (Signed)
 Pt has appt today

## 2024-03-19 NOTE — Telephone Encounter (Signed)
 [name]- Pt didn't go to ED, can you call and check on her?

## 2024-03-19 NOTE — Telephone Encounter (Signed)
 Pt cancelled

## 2024-03-19 NOTE — Telephone Encounter (Signed)
 Provider has been advised Pt canceled her appt.

## 2024-03-21 ENCOUNTER — Telehealth: Payer: Self-pay | Admitting: Acute Care

## 2024-03-21 DIAGNOSIS — R911 Solitary pulmonary nodule: Secondary | ICD-10-CM

## 2024-03-21 NOTE — Telephone Encounter (Signed)
 She will need a 3 month follow up with OV after. The nodule is persistent/ stable  in size, but the solid component is smaller now measuring 7 mm. She will need to come in after the next scan. I will place the order for follow up which will be due at the end of September 2025. Thanks so much.

## 2024-03-22 NOTE — Telephone Encounter (Signed)
 See provider note 03/22/2024.

## 2024-03-22 NOTE — Telephone Encounter (Signed)
 Spoke with patient and reviewed results. She is in agreement to a repeat scan in September and f/u appt in office. She has been scheduled for 05/11/2024 and f/u appt 05/21/2024. No additional concerns.

## 2024-03-27 ENCOUNTER — Other Ambulatory Visit: Payer: Self-pay | Admitting: Behavioral Health

## 2024-03-27 DIAGNOSIS — F411 Generalized anxiety disorder: Secondary | ICD-10-CM

## 2024-03-27 DIAGNOSIS — F3130 Bipolar disorder, current episode depressed, mild or moderate severity, unspecified: Secondary | ICD-10-CM

## 2024-03-28 ENCOUNTER — Telehealth: Payer: Self-pay

## 2024-03-28 ENCOUNTER — Other Ambulatory Visit: Payer: Self-pay

## 2024-03-28 DIAGNOSIS — F3130 Bipolar disorder, current episode depressed, mild or moderate severity, unspecified: Secondary | ICD-10-CM

## 2024-03-28 DIAGNOSIS — F410 Panic disorder [episodic paroxysmal anxiety] without agoraphobia: Secondary | ICD-10-CM

## 2024-03-28 DIAGNOSIS — F411 Generalized anxiety disorder: Secondary | ICD-10-CM

## 2024-03-28 MED ORDER — ESCITALOPRAM OXALATE 20 MG PO TABS
20.0000 mg | ORAL_TABLET | Freq: Every day | ORAL | 1 refills | Status: DC
Start: 2024-03-28 — End: 2024-04-24

## 2024-03-28 NOTE — Telephone Encounter (Signed)
 Copied from CRM [id number]. Topic: General - Other >> Mar 27, 2024  4:46 PM Deleta S wrote: Reason for CRM: Abby from [facility] call in regards to the patient for glucose monitor. There was paper work faxed over and abby would like an update on if the paper work was received. You can give her a call at [phone] regarding these concerns. Ask for abby  Called [facility] was advised we haven't received  Anything from them for Pt. They stated would refax .

## 2024-03-29 ENCOUNTER — Other Ambulatory Visit: Payer: Self-pay

## 2024-03-29 DIAGNOSIS — S0093XA Contusion of unspecified part of head, initial encounter: Secondary | ICD-10-CM | POA: Diagnosis not present

## 2024-03-29 DIAGNOSIS — S4991XA Unspecified injury of right shoulder and upper arm, initial encounter: Secondary | ICD-10-CM | POA: Diagnosis not present

## 2024-03-29 DIAGNOSIS — W19XXXA Unspecified fall, initial encounter: Secondary | ICD-10-CM | POA: Diagnosis not present

## 2024-03-29 DIAGNOSIS — S42034A Nondisplaced fracture of lateral end of right clavicle, initial encounter for closed fracture: Secondary | ICD-10-CM | POA: Diagnosis not present

## 2024-04-05 NOTE — Telephone Encounter (Signed)
 Abby from [facility] calling again to enquire about this forms

## 2024-04-06 NOTE — Telephone Encounter (Signed)
 Provider stated pt has moved.

## 2024-04-09 ENCOUNTER — Telehealth: Payer: Self-pay

## 2024-04-09 NOTE — Telephone Encounter (Signed)
 Copied from CRM [id number]. Topic: Clinical - Order For Equipment >> Apr 09, 2024 10:14 AM Carlatta H wrote: Reason for CRM:  Abby from [facility] call in regards to the patient for glucose monitor. There was paper work faxed over and abby would like an update on if the paper work was received. You can give her a call at [phone] regarding these concerns  Provider stated pt has moved. We don't have paperwork.

## 2024-04-16 ENCOUNTER — Other Ambulatory Visit: Payer: Self-pay

## 2024-04-17 NOTE — Telephone Encounter (Signed)
 Tried call them back and provider stated [place].

## 2024-04-17 NOTE — Telephone Encounter (Unsigned)
 Copied from CRM [id number]. Topic: General - Other >> Apr 16, 2024  5:06 PM Lavanda D wrote: Reason for CRM: Abby with [facility] is calling again for an update on the fax regarding a glucose monitor. Relayed that they do not have paperwork and advised Abby to re-fax the request. Please confirm with Abby if this has been taken care of. Verified Dr. Frann as PCP and fax number.    ----------------------------------------------------------------------- From previous Reason for Contact - Scheduling: Patient/patient representative is calling to schedule an appointment. Refer to attachments for appointment information.

## 2024-04-18 ENCOUNTER — Telehealth: Payer: Self-pay

## 2024-04-18 MED ORDER — BUPROPION HCL ER (XL) 150 MG PO TB24: 150.0000 mg | ORAL_TABLET | Freq: Every day | ORAL | 1 refills | Status: DC

## 2024-04-18 NOTE — Telephone Encounter (Signed)
 Copied from CRM [id number]. Topic: Clinical - Medication Question >> Apr 18, 2024 12:10 PM Jayma L wrote: Reason for CRM: Dr Mariah with the pharmacy called stated patient is on a higher dose for her bupropion  Wellbutrin  XL 300mg s, stated she thinks it should be 150mg s daily Max.   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 Please call her back at [phone] as this is her direct line and you can leave a voicemail

## 2024-04-18 NOTE — Addendum Note (Signed)
 Addended by: FRANN MABEL SQUIBB on: 04/18/2024 01:53 PM   Modules accepted: Orders

## 2024-04-18 NOTE — Telephone Encounter (Signed)
 Based on renal function, will reduce to 150 mg/d. She was rx'd this by psychiatry so would defer to them.    Agree with fenofibrate  recommendations and appreciate guidance. Will stop, plz let pt know.

## 2024-04-20 ENCOUNTER — Encounter: Payer: Self-pay | Admitting: Family Medicine

## 2024-04-20 DIAGNOSIS — S42009S Fracture of unspecified part of unspecified clavicle, sequela: Secondary | ICD-10-CM

## 2024-04-23 ENCOUNTER — Other Ambulatory Visit: Payer: Self-pay | Admitting: Family Medicine

## 2024-04-23 ENCOUNTER — Telehealth: Payer: Self-pay

## 2024-04-23 NOTE — Telephone Encounter (Signed)
 Called pt and referral was palced for here per pt, she not moving to [place]  later.

## 2024-04-23 NOTE — Telephone Encounter (Signed)
 Copied from CRM #[id number]. Topic: Clinical - Order For Equipment >> Apr 20, 2024  1:17 PM Chasity T wrote: Reason for CRM: Abby calling from [facility] regarding recent fax sent over for patient to receive a continuous glucose monitor and sensor for diabetes. She is wanting an update on the form that needs to be filled out. Please contact back at [phone]   Form has been faxed.

## 2024-04-23 NOTE — Telephone Encounter (Signed)
 Called pt was advised and that pharmacist call with recommendation about based on renal function, needed reduce to 150 mg/d. She was rx'd this by psychiatry so would defer to them.  Dr.Wendling agreed. Pt stated she see her psychiatry tomorrow and will let them.

## 2024-04-23 NOTE — Telephone Encounter (Signed)
 Called Dr Mariah with the pharmacy and advised and tried calling the pt. Sent pt mychart message about the changes and If she had any question she can call the office back.

## 2024-04-24 ENCOUNTER — Telehealth: Payer: Medicare (Managed Care) | Admitting: Behavioral Health

## 2024-04-24 ENCOUNTER — Encounter: Payer: Self-pay | Admitting: Family Medicine

## 2024-04-24 ENCOUNTER — Encounter: Payer: Self-pay | Admitting: Behavioral Health

## 2024-04-24 DIAGNOSIS — E785 Hyperlipidemia, unspecified: Secondary | ICD-10-CM | POA: Diagnosis not present

## 2024-04-24 DIAGNOSIS — Z7985 Long-term (current) use of injectable non-insulin antidiabetic drugs: Secondary | ICD-10-CM | POA: Diagnosis not present

## 2024-04-24 DIAGNOSIS — N1832 Chronic kidney disease, stage 3b: Secondary | ICD-10-CM | POA: Diagnosis not present

## 2024-04-24 DIAGNOSIS — S92009A Unspecified fracture of unspecified calcaneus, initial encounter for closed fracture: Secondary | ICD-10-CM | POA: Diagnosis not present

## 2024-04-24 DIAGNOSIS — F410 Panic disorder [episodic paroxysmal anxiety] without agoraphobia: Secondary | ICD-10-CM | POA: Diagnosis not present

## 2024-04-24 DIAGNOSIS — E1122 Type 2 diabetes mellitus with diabetic chronic kidney disease: Secondary | ICD-10-CM | POA: Diagnosis not present

## 2024-04-24 DIAGNOSIS — T148XXA Other injury of unspecified body region, initial encounter: Secondary | ICD-10-CM | POA: Insufficient documentation

## 2024-04-24 DIAGNOSIS — F3130 Bipolar disorder, current episode depressed, mild or moderate severity, unspecified: Secondary | ICD-10-CM | POA: Diagnosis not present

## 2024-04-24 DIAGNOSIS — K219 Gastro-esophageal reflux disease without esophagitis: Secondary | ICD-10-CM | POA: Diagnosis not present

## 2024-04-24 DIAGNOSIS — F419 Anxiety disorder, unspecified: Secondary | ICD-10-CM | POA: Diagnosis not present

## 2024-04-24 DIAGNOSIS — J45909 Unspecified asthma, uncomplicated: Secondary | ICD-10-CM | POA: Diagnosis not present

## 2024-04-24 DIAGNOSIS — I959 Hypotension, unspecified: Secondary | ICD-10-CM | POA: Diagnosis not present

## 2024-04-24 DIAGNOSIS — F411 Generalized anxiety disorder: Secondary | ICD-10-CM | POA: Diagnosis not present

## 2024-04-24 DIAGNOSIS — R55 Syncope and collapse: Secondary | ICD-10-CM | POA: Diagnosis not present

## 2024-04-24 DIAGNOSIS — F5104 Psychophysiologic insomnia: Secondary | ICD-10-CM | POA: Diagnosis not present

## 2024-04-24 DIAGNOSIS — I480 Paroxysmal atrial fibrillation: Secondary | ICD-10-CM | POA: Diagnosis not present

## 2024-04-24 DIAGNOSIS — F3181 Bipolar II disorder: Secondary | ICD-10-CM | POA: Diagnosis not present

## 2024-04-24 DIAGNOSIS — Z9181 History of falling: Secondary | ICD-10-CM | POA: Diagnosis not present

## 2024-04-24 DIAGNOSIS — Z7984 Long term (current) use of oral hypoglycemic drugs: Secondary | ICD-10-CM | POA: Diagnosis not present

## 2024-04-24 DIAGNOSIS — Z0001 Encounter for general adult medical examination with abnormal findings: Secondary | ICD-10-CM | POA: Diagnosis not present

## 2024-04-24 DIAGNOSIS — G43909 Migraine, unspecified, not intractable, without status migrainosus: Secondary | ICD-10-CM | POA: Diagnosis not present

## 2024-04-24 DIAGNOSIS — F319 Bipolar disorder, unspecified: Secondary | ICD-10-CM

## 2024-04-24 MED ORDER — LURASIDONE HCL 120 MG PO TABS
1.0000 | ORAL_TABLET | Freq: Every day | ORAL | 3 refills | Status: AC
Start: 2024-04-24 — End: ?

## 2024-04-24 MED ORDER — ESCITALOPRAM OXALATE 20 MG PO TABS: 20.0000 mg | ORAL_TABLET | Freq: Every day | ORAL | 3 refills | Status: AC

## 2024-04-24 MED ORDER — QUETIAPINE FUMARATE 300 MG PO TABS
300.0000 mg | ORAL_TABLET | Freq: Every day | ORAL | 3 refills | Status: AC
Start: 2024-04-24 — End: ?

## 2024-04-24 NOTE — Progress Notes (Signed)
 Paula Stout [id number] 07-Oct-1955 68 y.o.  Virtual Visit via Video Note  I connected with pt @ on 04/24/24 at  2:00 PM EDT by a video enabled telemedicine application and verified that I am speaking with the correct person using two identifiers.   I discussed the limitations of evaluation and management by telemedicine and the availability of in person appointments. The patient expressed understanding and agreed to proceed.  I discussed the assessment and treatment plan with the patient. The patient was provided an opportunity to ask questions and all were answered. The patient agreed with the plan and demonstrated an understanding of the instructions.   The patient was advised to call back or seek an in-person evaluation if the symptoms worsen or if the condition fails to improve as anticipated.  I provided 30 minutes of non-face-to-face time during this encounter.  The patient was located at home.  The provider was located at [facility].   Redell DELENA Pizza, NP   Subjective:   Patient ID:  Paula Stout is a 68 y.o. (DOB 03/18/1956) female.  Chief Complaint: No chief complaint on file.   HPI [name], 68 year old female presents to this office via video visit for follow up and medication management.  Collateral information should be considered reliable. Pt report her PCP reduced her Wellbutrin  due to decreased kidney fx. She is following up with nephrology this week. She reports doing well overall and in good spirits. Says that she will be moving to [place] in October and will notify office before her move. Requesting bridge on medications until she is established.  She is sleeping 7 to 8 hours per night with the aid of Seroquel .  She denies any current mania, no history of psychosis or auditory or visual hallucinations.  No SI or HI.  Patient states that she feels safe and verbally contracts for safety today.   Past psychiatric medication  trials: Zoloft Prozac Cymbalta Lithium Depakote Valium Ativan  Gabapentin Trazodone  Ambien       Review of Systems:  Review of Systems  Constitutional: Negative.   Allergic/Immunologic: Negative.   Neurological: Negative.   Psychiatric/Behavioral: Negative.      Medications: I have reviewed the patient's current medications.  Current Outpatient Medications  Medication Sig Dispense Refill   acetaminophen  (TYLENOL ) 500 MG tablet Take 1,000 mg by mouth every 6 (six) hours as needed for mild pain.     apixaban  (ELIQUIS ) 5 MG TABS tablet Take 1 tablet (5 mg total) by mouth 2 (two) times daily. 180 tablet 1   atorvastatin  (LIPITOR ) 10 MG tablet Take 1 tablet (10 mg total) by mouth daily. 90 tablet 3   Baclofen  5 MG TABS Take 1 tablet (5 mg total) by mouth 2 (two) times daily as needed. (Patient taking differently: Take 1 tablet by mouth 2 (two) times daily as needed (for muscle spasms).) 10 tablet 5   blood glucose meter kit and supplies One Touch Ultra  Check 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 One Touch Ultra  Check blood sugars once daily Dx: E11.9) 1 each 0   botulinum toxin Type A  (BOTOX ) 200 units injection Inject 155 units IM into multiple site in the face,neck and head once every 90 days 1 each 4   budesonide -formoterol  (SYMBICORT ) 80-4.5 MCG/ACT inhaler Inhale 2 puffs into the lungs 2 (two) times daily. 20.4 g 3   buPROPion  (WELLBUTRIN  XL) 150 MG 24 hr tablet Take 1 tablet (150 mg  total) by mouth daily. 90 tablet 1   Continuous Glucose Sensor (DEXCOM G7 SENSOR) MISC Check blood sugars as directed 3 each 3   EPINEPHrine  0.3 mg/0.3 mL IJ SOAJ injection Inject 0.3 mg into the muscle as needed for anaphylaxis. 2 each 1   escitalopram  (LEXAPRO ) 20 MG tablet Take 1 tablet (20 mg total) by mouth daily. 90 tablet 3   ezetimibe  (ZETIA ) 10 MG tablet TAKE 1 TABLET DAILY 90 tablet 3   FARXIGA  10 MG TABS tablet TAKE  1 TABLET DAILY (Patient taking differently: Take 10 mg by mouth every evening.) 90 tablet 3   fluticasone  (FLONASE ) 50 MCG/ACT nasal spray Place 2 sprays into both nostrils daily. 16 g 6   Fremanezumab -vfrm (AJOVY ) 225 MG/1.5ML SOAJ Inject 225 mg into the skin every 30 (thirty) days. 1.68 mL 5   levocetirizine (XYZAL ) 5 MG tablet TAKE 1 TABLET EVERY EVENING 90 tablet 3   LORazepam  (ATIVAN ) 0.5 MG tablet TAKE ONE TABLET BY MOUTH EVERY 8 HOURS 30 tablet 1   Lurasidone  HCl 120 MG TABS Take 1 tablet (120 mg total) by mouth at bedtime. 90 tablet 3   magnesium  oxide (MAG-OX) 400 (240 Mg) MG tablet Take 400 mg by mouth daily.     methocarbamol  (ROBAXIN ) 500 MG tablet Take 1 tablet (500 mg total) by mouth every 8 (eight) hours as needed for muscle spasms. 21 tablet 0   midodrine  (PROAMATINE ) 10 MG tablet Take 1 tablet (10 mg total) by mouth 3 (three) times daily. 90 tablet 1   MOUNJARO  7.5 MG/0.5ML Pen INJECT THE CONTENTS OF ONE PEN UNDER THE SKIN WEEKLY ON THE SAME DAY EACH WEEK 2 mL 1   Multiple Vitamins-Minerals (MULTIVITAMIN WITH MINERALS) tablet Take 1 tablet by mouth daily. Daily Diabetes Health Pack - MVI + chromium, fish oil + vit D, magnesium , vit C, alpha lipoic acid w/ green tea     nitroGLYCERIN  (NITROSTAT ) 0.4 MG SL tablet Place 1 tablet (0.4 mg total) under the tongue every 5 (five) minutes as needed for chest pain. 25 tablet 11   ondansetron  (ZOFRAN ) 4 MG tablet Take 1 tablet (4 mg total) by mouth every 6 (six) hours. (Patient taking differently: Take 4 mg by mouth every 8 (eight) hours as needed for nausea or vomiting.) 12 tablet 0   ONETOUCH VERIO test strip USE DAILY TO CHECK BLOOD SUGAR (Patient taking differently: 1 each by Other route See admin instructions. Use daily to check blood sugar.) 100 strip 3   pantoprazole  (PROTONIX ) 40 MG tablet TAKE 1 TABLET DAILY 90 tablet 3   QUEtiapine  (SEROQUEL ) 300 MG tablet Take 1 tablet (300 mg total) by mouth at bedtime. 90 tablet 3   Rimegepant  Sulfate (NURTEC) 75 MG TBDP Take 1 tablet (75 mg total) by mouth as needed (take 1 tab at the earlist onset of a migraine. Max 1 tab in 24 hours). 10 tablet 5   topiramate  (TOPAMAX ) 50 MG tablet TAKE 3 TABLETS TWICE A DAY 180 tablet 11   No current facility-administered medications for this visit.    Medication Side Effects: None  Allergies:  Allergies  Allergen Reactions   Bee Venom Anaphylaxis   Onion Other (See Comments)    Respiratory Distress   Tetanus-Diphtheria Toxoids Td Anaphylaxis   Sumatriptan      Passed out, nose bleed    Zolpidem  Other (See Comments)    Causes sleep walking   Dorethia Jansky ] Nausea And Vomiting   Iodine Rash   Nsaids Nausea Only  Rash, hives and trouble breathing   Sulfa Antibiotics Rash   Vancomycin Rash    Other Reaction(s): STEVENS-JOHNSON    Past Medical History:  Diagnosis Date   Adhesive capsulitis of left shoulder 12/16/2016   Asthma without status asthmaticus 02/06/2021   At risk for falls 02/04/2019   Atrial fibrillation (HCC) 09/01/2014   Last Assessment & Plan:  Formatting of this note might be different from the original. A:  Chronic.  Sinus rhythm at this time.  States she was told this during admission at [facility] in the past. P:  On baby aspirin  at home will continue.  Low dose metoprolol  started.   Bipolar 1 disorder, depressed, moderate (HCC) 02/18/2020   Capsulitis of left shoulder 10/20/2021   Chronic anticoagulation 02/20/2020   Chronic atrial fibrillation (HCC) 02/18/2020   Chronic headache 10/11/2013   Chronic interstitial cystitis 12/28/2005   Chronic migraine without aura, intractable, without status migrainosus 06/24/2017   Colon cancer screening 04/15/2016   Last Assessment & Plan:  Formatting of this note might be different from the original. Will schedule for colonoscopy   Depression, recurrent (HCC) 02/18/2020   Diabetes mellitus (HCC)    Dyslipidemia 02/20/2020   GAD (generalized anxiety disorder) 02/18/2020    GERD (gastroesophageal reflux disease) 11/05/2021   H/O chest pain 07/31/2021   History of atrial fibrillation 06/24/2017   History of posttraumatic stress disorder (PTSD) 10/07/2016   Hyperosmolarity due to secondary diabetes mellitus (HCC) 02/06/2021   Impaired mobility and activities of daily living 11/14/2015   Irritable bowel syndrome (IBS)    Junctional tachycardia (HCC) 11/05/2021   Kidney stone 02/06/2021   Migraine without aura, not refractory 02/06/2021   Mixed hyperlipidemia 04/06/2021   Last Assessment & Plan:  Formatting of this note might be different from the original. 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 Controlled   Refractory migraine with aura 02/06/2021   Refractory migraine without aura 02/06/2021   Renal colic on left side 04/22/2017   Stage 3 chronic kidney disease (HCC) 02/20/2020   Thyroid  goiter    10/17/23 CT: right thyorid goiter with substernal extension, benign FNA 07/07/23   Unspecified adverse effect of other drug, medicinal and biological substance(995.29) 02/17/2006   Urinary tract obstruction 02/06/2021    Family History  Adopted: Yes  Problem Relation Age of Onset   Migraines Daughter    Bipolar disorder Daughter    Migraines Daughter    Migraines Son    Bipolar disorder Son     Social History   Socioeconomic History   Marital status: Widowed    Spouse name: Not on file   Number of children: 6   Years of education: Not on file   Highest education level: Associate degree: academic program  Occupational History   Occupation: not employed  Tobacco Use   Smoking status: Never    Passive exposure: Past    Smokeless tobacco: Never   Tobacco comments:    Never smoke 07/15/22  Vaping Use   Vaping status: Never Used  Substance and Sexual Activity   Alcohol use: Never    Comment: light social   Drug use: Never   Sexual activity: Not Currently  Other Topics  Concern   Not on file  Social History Narrative   Lives in [place] with daughter and son-in-law.  Sews clothing in free time as hobby.    Social Drivers of Health   Financial Resource Strain: Medium Risk (03/04/2024)   Overall Financial Resource Strain (CARDIA)    Difficulty of Paying Living Expenses: Somewhat hard  Food Insecurity: No Food Insecurity (03/04/2024)   Hunger Vital Sign    Worried About Running Out of Food in the Last Year: Never true    Ran Out of Food in the Last Year: Never true  Transportation Needs: No Transportation Needs (03/04/2024)   PRAPARE - Administrator, Civil Service (Medical): No    Lack of Transportation (Non-Medical): No  Physical Activity: Inactive (03/04/2024)   Exercise Vital Sign    Days of Exercise per Week: 0 days    Minutes of Exercise per Session: Not on file  Stress: Stress Concern Present (03/04/2024)   [employer] of Occupational Health - Occupational Stress Questionnaire    Feeling of Stress: Rather much  Social Connections: Moderately Isolated (03/04/2024)   Social Connection and Isolation Panel    Frequency of Communication with Friends and Family: More than three times a week    Frequency of Social Gatherings with Friends and Family: Never    Attends Religious Services: 1 to 4 times per year    Active Member of [employer] or Organizations: No    Attends Banker Meetings: Not on file    Marital Status: Widowed  Intimate Partner Violence: Not At Risk (01/26/2024)   Humiliation, Afraid, Rape, and Kick questionnaire    Fear of Current or Ex-Partner: No    Emotionally Abused: No    Physically Abused: No    Sexually Abused: No    Past Medical History,  Surgical history, Social history, and Family history were reviewed and updated as appropriate.   Please see review of systems for further details on the patient's review from today.   Objective:   Physical Exam:  There were no vitals taken for this visit.  Physical Exam Neurological:     Mental Status: She is alert and oriented to person, place, and time.  Psychiatric:        Attention and Perception: Attention and perception normal.        Mood and Affect: Mood normal.        Speech: Speech normal.        Behavior: Behavior normal. Behavior is cooperative.        Cognition and Memory: Cognition and memory normal.        Judgment: Judgment normal.     Comments: Insight intact     Lab Review:     Component Value Date/Time   NA 140 03/05/2024 1441   NA 141 04/20/2023 1541   K 4.0 03/05/2024 1441   CL 108 03/05/2024 1441   CO2 21 03/05/2024 1441   GLUCOSE 93 03/05/2024 1441   BUN 16 03/05/2024 1441   BUN 14 04/20/2023 1541   CREATININE 1.83 (H) 03/05/2024 1441   CALCIUM  9.7 03/05/2024 1441   CALCIUM  9.6 10/20/2023 0000   PROT 6.0 03/05/2024 1441   PROT 6.7 02/10/2021 0914   ALBUMIN 3.6 03/05/2024 1441   ALBUMIN 3.9 02/10/2021 0914   AST 322 (H) 03/05/2024 1441   ALT 224 (H) 03/05/2024 1441   ALKPHOS 50 03/05/2024 1441   BILITOT 1.0 03/05/2024 1441   BILITOT 0.3 02/10/2021 0914   GFRNONAA  32 (L) 02/27/2024 1955   GFRAA 39 (L) 04/22/2020 1726       Component Value Date/Time   WBC 7.4 03/05/2024 1441   RBC 3.90 03/05/2024 1441   HGB 12.1 03/05/2024 1441   HGB 12.4 04/20/2023 1537   HCT 38.0 03/05/2024 1441   HCT 38.3 04/20/2023 1537   PLT 341.0 03/05/2024 1441   PLT 324 04/20/2023 1537   MCV 97.4 03/05/2024 1441   MCV 92 04/20/2023 1537   MCH 30.5 02/27/2024 1955   MCHC 31.9 03/05/2024 1441   RDW 18.0 (H) 03/05/2024 1441   RDW 13.7 04/20/2023 1537   LYMPHSABS 2.8 02/05/2024 1927   LYMPHSABS 1.9 10/13/2022 1002   MONOABS 0.8 02/05/2024 1927   EOSABS 0.5  02/05/2024 1927   EOSABS 0.3 10/13/2022 1002   BASOSABS 0.1 02/05/2024 1927   BASOSABS 0.1 10/13/2022 1002    No results found for: POCLITH, LITHIUM   No results found for: PHENYTOIN, PHENOBARB, VALPROATE, CBMZ   .res Assessment: Plan:      Greater than 50% of 30  min video visit time with patient was spent on counseling and coordination of care. We discussed her recent health concerns as well as her medications. Reviewed her most recent labs. In agreement with PCP to reduce Wellbutrin  for reduced kidney fx.  She is following up with Nephrology. We discussed her upcoming move back to [place] in Oct. Will bridge medication for 2 months until established.   We agreed today to:   Will continue Seroquel  to 200 mg at bedtime daily Will continue Latuda  120 mg daily Decreased  Wellbutrin  to 150  mg daily per PCP To increase Lexapro  to 20  mg daily after breakfast  Will report worsening symptoms promptly Provided emergency contact information Discussed potential metabolic side effects associated with atypical antipsychotics, as well as potential risk for movement side effects. Advised pt to contact office if movement side effects occur.   Reviewed PDMP      Redell LABOR. Teigen Bellin, NP     Diagnoses and all orders for this visit:  Bipolar 1 disorder (HCC) -     QUEtiapine  (SEROQUEL ) 300 MG tablet; Take 1 tablet (300 mg total) by mouth at bedtime.  Bipolar I disorder, most recent episode depressed (HCC) -     Lurasidone  HCl 120 MG TABS; Take 1 tablet (120 mg total) by mouth at bedtime. -     escitalopram  (LEXAPRO ) 20 MG tablet; Take 1 tablet (20 mg total) by mouth daily.  Generalized anxiety disorder -     escitalopram  (LEXAPRO ) 20 MG tablet; Take 1 tablet (20 mg total) by mouth daily.  Panic attack -     escitalopram  (LEXAPRO ) 20 MG tablet; Take 1 tablet (20 mg total) by mouth daily.     Please see After Visit Summary for patient specific instructions.  Future  Appointments  Date Time Provider Department Center  04/25/2024  2:00 PM Zulema Lorrene HER[facility] None  04/26/2024  3:00 PM Corey Rufus HERO, MD [facility] None  04/30/2024  2:20 PM Bernie Lamar PARAS, MD [facility]HIGHPT None  05/01/2024  2:00 PM Frann Mabel Mt, DO [facility] P[facility]  05/03/2024 10:15 AM Jerri Kay HERO, MD [facility] None  05/11/2024  2:00 PM [facility] 2 [facility]  05/22/2024  2:00 PM Ruthell Lauraine FALCON, NP [facility]UL[facility] None  05/25/2024 12:50 PM Skeet Juliene SAUNDERS, DO LBN[facility]LBNG None  05/30/2024 10:15 AM Frann Mabel Mt, DO [facility]  06/20/2024 10:00 AM [facility] PHARMACIST [facility] P[facility]  08/15/2024  2:30 PM Jaffe, Adam R, DO LBN[facility]LB[facility] None    No orders of the defined types were placed in this encounter.     -------------------------------

## 2024-04-24 NOTE — Telephone Encounter (Signed)
 Could you resend please

## 2024-04-25 ENCOUNTER — Ambulatory Visit: Payer: Medicare (Managed Care) | Admitting: Behavioral Health

## 2024-04-25 ENCOUNTER — Encounter: Payer: Self-pay | Admitting: Behavioral Health

## 2024-04-25 ENCOUNTER — Other Ambulatory Visit: Payer: Self-pay | Admitting: Family Medicine

## 2024-04-25 DIAGNOSIS — F411 Generalized anxiety disorder: Secondary | ICD-10-CM | POA: Diagnosis not present

## 2024-04-25 DIAGNOSIS — F331 Major depressive disorder, recurrent, moderate: Secondary | ICD-10-CM | POA: Diagnosis not present

## 2024-04-25 NOTE — Progress Notes (Signed)
 [facility] Counselor/Therapist Progress Note  Patient ID: Paula Stout, MRN: [id number],    Date: 04/25/2024 Time Spent: 2 PM until 2:57 PM, 56 minutes. The patient consented to the video teletherapy and was located in her home during this session. She is aware it is the responsibility of the patient to secure confidentiality on her end of the session. The provider was in a private home  for the duration of this session.      Treatment Type: Individual Therapy  Reported Symptoms: Anxiety, depression  Mental Status Exam: Appearance:  Casual     Behavior: Appropriate  Motor: Normal  Speech/Language:  Normal Rate  Affect: Appropriate  Mood: normal  Thought process: normal  Thought content:   WNL  Sensory/Perceptual disturbances:   WNL  Orientation: oriented to person, place, time/date, situation, day of week, month of year, and year  Attention: Good  Concentration: Good  Memory: WNL  Fund of knowledge:  Good  Insight:   Good  Judgment:  Good  Impulse Control: Good   Risk Assessment: Danger to Self:  No Self-injurious Behavior: No Danger to Others: No Duty to Warn:no Physical Aggression / Violence:No  Access to Firearms a concern: No  Gang Involvement:No   Subjective: The patient enjoyed her visit with her children and [place].  She will be returning there full-time anticipating leaving early October.  An apartment on the first floor will be available then and so she is getting as many doctors visits in between now and then as she can.  Fortunately while out there she slipped on hardwood stairs and fractured her clavicle and possibly tore her rotator cuff.  She is getting into see an orthopedic specialist next week and is in fairly significant pain and having difficulty sleeping because of the pain.  She said that while out visiting family she had very few issues with her heart or arrhythmia and sees even more how much that is tied to the environment  that she is living in.  She said it was almost like trauma walking back into the house where she is staying for the next 6 weeks or so where she has lived for the past 4 years.  She does have a plan for moving out and her adult children are helping her with that.  I encouraged continued compartmentalization as well as setting boundaries and using her coping skills.  I will meet with her close to weekly as possible.  She does contract for safety having no thoughts of hurting herself or anyone else. Diagnosis: Generalized anxiety disorder, major depressive disorder, moderate, recurrent Plan: I will meet with the patient weekly via care agility.  Treatment plan: We will use cognitive behavioral therapy as well as person centered and supportive therapy in addition to elements of dialectical behavior therapy to help reduce the patient's anxiety and depression by at least 50% with a target date of October 06, 2023.  Goals for improving depression may include having less sadness as indicated by patient report and scores on the PHQ-9, have improved mood and return to a healthier level of functioning, identify causes including environment for depressed mood and learn ways to cope with depression especially those connected to her medical issues.  Interventions include using cognitive behavioral therapy to explore and replace thoughts and behaviors.  We will look at how depression is experienced in day-to-day living and encouraged sharing of feelings.  We will encourage the use of coping skills for management of depressive symptoms.  Goals  for reducing anxiety are to improve her ability to manage anxiety symptoms, better handle stress, identify causes for anxiety and explore ways to lower it, resolve the core conflicts contributing to anxiety as well as manage thoughts and worrisome thinking contributing to feelings of anxiety.  Interventions will include providing education about anxiety, facilitate problem solution  skills to help her identify options for resolving stress, teach coping skills for managing anxiety as well as mindfulness and communication in terms of family to reduce her stress level, use cognitive behavior therapy to identify and change anxiety provoking thought and behavior patterns as well as teach distress tolerance and mindfulness skills for alleviating anxiety. Progress: 40% progress notes were reviewed with a new extended target date of  January 31st, 2026.  Lorrene CHRISTELLA Hasten, [facility]                                         Lorrene CHRISTELLA Hasten, [facility]               Lorrene CHRISTELLA Hasten, [facility]               Lorrene CHRISTELLA Hasten, [facility]               Lorrene CHRISTELLA Hasten, [facility]               Lorrene CHRISTELLA Hasten, [facility]               Lorrene CHRISTELLA Hasten, [facility]               Lorrene CHRISTELLA Hasten, [facility]               Lorrene CHRISTELLA Hasten, [facility]               Lorrene CHRISTELLA Hasten, [facility]               Lorrene CHRISTELLA Hasten, [facility]               Lorrene CHRISTELLA Hasten, [facility]

## 2024-04-26 ENCOUNTER — Telehealth: Payer: Self-pay

## 2024-04-26 ENCOUNTER — Ambulatory Visit: Payer: Medicare (Managed Care) | Admitting: Dermatology

## 2024-04-26 NOTE — Telephone Encounter (Signed)
 Copied from CRM #[id number]. Topic: General - Other >> Apr 26, 2024  2:51 PM Berneda FALCON wrote: Reason for CRM: Samantha with [employer] states they got the order back but need the last office visit notes that pertain to diabetes as well.  [phone]  Fax-[phone]

## 2024-04-27 ENCOUNTER — Encounter: Payer: Self-pay | Admitting: Family Medicine

## 2024-04-27 DIAGNOSIS — K0889 Other specified disorders of teeth and supporting structures: Secondary | ICD-10-CM

## 2024-04-27 NOTE — Telephone Encounter (Signed)
 Paper work has been faxed

## 2024-04-28 ENCOUNTER — Encounter: Payer: Self-pay | Admitting: Behavioral Health

## 2024-04-30 ENCOUNTER — Ambulatory Visit: Payer: Medicare (Managed Care) | Attending: Cardiology | Admitting: Cardiology

## 2024-04-30 ENCOUNTER — Encounter: Payer: Self-pay | Admitting: Cardiology

## 2024-04-30 VITALS — BP 112/62 | HR 76 | Resp 20 | Ht 66.0 in | Wt 166.8 lb

## 2024-04-30 DIAGNOSIS — I471 Supraventricular tachycardia, unspecified: Secondary | ICD-10-CM

## 2024-04-30 DIAGNOSIS — F319 Bipolar disorder, unspecified: Secondary | ICD-10-CM

## 2024-04-30 DIAGNOSIS — E119 Type 2 diabetes mellitus without complications: Secondary | ICD-10-CM

## 2024-04-30 MED ORDER — TRAMADOL HCL 50 MG PO TABS: 50.0000 mg | ORAL_TABLET | Freq: Three times a day (TID) | ORAL | 0 refills | Status: AC | PRN

## 2024-04-30 NOTE — Patient Instructions (Signed)

## 2024-04-30 NOTE — Progress Notes (Signed)
 Cardiology Office Note:    Date:  04/30/2024   ID:  Paula Stout, DOB 19-Oct-1955, MRN [id number]  PCP:  Frann Mabel Mt, DO  Cardiologist:  Lamar Fitch, MD    Referring MD: Frann Mabel Mt*   Chief Complaint  Patient presents with   Follow-up    History of Present Illness:    Paula Stout is a 68 y.o. female past medical history significant for paroxysmal atrial fibrillation, type 2 diabetes, dyslipidemia, bipolar disorder.  Orthostatic hypotension.  She comes today 2 months for follow-up she is very happy and smiling today.  Apparently she spent about a month in [place]  [place] with her family and she is thinking about relocating there in October.  She said she got great time but Thursday she fell down and she slipped on the floor fell down and broke her clavicle.  Denies have any palpitations no chest pain tightness squeezing pressure burning chest.  Still does have some orthostasis but she says she is financial or health relief with that she thinks midodrine  help her a great deal.  Past Medical History:  Diagnosis Date   Adhesive capsulitis of left shoulder 12/16/2016   Asthma without status asthmaticus 02/06/2021   At risk for falls 02/04/2019   Atrial fibrillation (HCC) 09/01/2014   Last Assessment & Plan:  Formatting of this note might be different from the original. A:  Chronic.  Sinus rhythm at this time.  States she was told this during admission at [facility] in the past. P:  On baby aspirin  at home will continue.  Low dose metoprolol  started.   Bipolar 1 disorder, depressed, moderate (HCC) 02/18/2020   Capsulitis of left shoulder 10/20/2021   Chronic anticoagulation 02/20/2020   Chronic atrial fibrillation (HCC) 02/18/2020   Chronic headache 10/11/2013   Chronic interstitial cystitis 12/28/2005   Chronic migraine without aura, intractable, without status migrainosus 06/24/2017   Colon cancer screening 04/15/2016   Last Assessment & Plan:   Formatting of this note might be different from the original. Will schedule for colonoscopy   Depression, recurrent (HCC) 02/18/2020   Diabetes mellitus (HCC)    Dyslipidemia 02/20/2020   GAD (generalized anxiety disorder) 02/18/2020   GERD (gastroesophageal reflux disease) 11/05/2021   H/O chest pain 07/31/2021   History of atrial fibrillation 06/24/2017   History of posttraumatic stress disorder (PTSD) 10/07/2016   Hyperosmolarity due to secondary diabetes mellitus (HCC) 02/06/2021   Impaired mobility and activities of daily living 11/14/2015   Irritable bowel syndrome (IBS)    Junctional tachycardia (HCC) 11/05/2021   Kidney stone 02/06/2021   Migraine without aura, not refractory 02/06/2021   Mixed hyperlipidemia 04/06/2021   Last Assessment & Plan:  Formatting of this note might be different from the original. 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 Controlled   Refractory migraine with aura 02/06/2021   Refractory migraine without aura 02/06/2021   Renal colic on left side 04/22/2017   Stage 3 chronic kidney disease (HCC) 02/20/2020   Thyroid  goiter    10/17/23 CT: right thyorid goiter with substernal extension, benign FNA 07/07/23   Unspecified adverse effect of other drug, medicinal and biological substance(995.29) 02/17/2006   Urinary tract obstruction 02/06/2021  Past Surgical History:  Procedure Laterality Date   ABDOMINAL HYSTERECTOMY     Fibroids   ATRIAL FIBRILLATION ABLATION     ATRIAL FIBRILLATION ABLATION N/A 10/27/2022   Procedure: ATRIAL FIBRILLATION ABLATION;  Surgeon: Inocencio Soyla Lunger, MD;  Location: [facility];  Service:  Cardiovascular;  Laterality: N/A;   BRONCHIAL BIOPSY  11/08/2023   Procedure: BRONCHOSCOPY, WITH BIOPSY;  Surgeon: Shelah Lamar RAMAN, MD;  Location: [facility]COPY;  Service: Pulmonary;;   BRONCHIAL BRUSHINGS  11/08/2023   Procedure: BRONCHOSCOPY, WITH BRUSH BIOPSY;  Surgeon: Shelah Lamar RAMAN, MD;  Location: [facility] ENDOSCOPY;  Service: Pulmonary;;   BRONCHIAL NEEDLE ASPIRATION BIOPSY  11/08/2023   Procedure: BRONCHOSCOPY, WITH NEEDLE ASPIRATION BIOPSY;  Surgeon: Shelah Lamar RAMAN, MD;  Location: [facility] ENDOSCOPY;  Service: Pulmonary;;   BRONCHIAL WASHINGS  11/08/2023   Procedure: IRRIGATION, BRONCHUS;  Surgeon: Shelah Lamar RAMAN, MD;  Location: [facility]OS[facility];  Service: Pulmonary;;   CARDIAC CATHETERIZATION     CARDIOVERSION     CESAREAN SECTION     KNEE SURGERY     LEFT HEART CATH AND CORONARY ANGIOGRAPHY N/A 03/04/2020   Procedure: LEFT HEART CATH AND CORONARY ANGIOGRAPHY;  Surgeon: Claudene Victory ORN, MD;  Location: [facility];  Service: Cardiovascular;  Laterality: N/A;   ROTATOR CUFF REPAIR     SVT ABLATION N/A 05/04/2023   Procedure: SVT ABLATION;  Surgeon: Inocencio Soyla Lunger, MD;  Location: [facility];  Service: Cardiovascular;  Laterality: N/A;    Current Medications: Current Meds  Medication Sig   acetaminophen  (TYLENOL ) 500 MG tablet Take 1,000 mg by mouth every 6 (six) hours as needed for mild pain.   apixaban  (ELIQUIS ) 5 MG TABS tablet Take 1 tablet (5 mg total) by mouth 2 (two) times daily.   atorvastatin  (LIPITOR ) 10 MG tablet Take 1 tablet (10 mg total) by mouth daily.   Baclofen  5 MG TABS Take 1 tablet (5 mg total) by mouth 2 (two) times daily as needed.   blood glucose meter kit and supplies One Touch Ultra  Check 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 One Touch Ultra  Check blood sugars once daily Dx: E11.9)   botulinum toxin Type A  (BOTOX ) 200 units injection Inject 155 units IM into multiple site in the face,neck  and head once every 90 days   budesonide -formoterol  (SYMBICORT ) 80-4.5 MCG/ACT inhaler Inhale 2 puffs into the lungs 2 (two) times daily.   buPROPion  (WELLBUTRIN  XL) 150 MG 24 hr tablet Take 1 tablet (150 mg total) by mouth daily.   Continuous Glucose Sensor (DEXCOM G7 SENSOR) MISC APPLY ONE SENSOR TO THE BACK OF YOUR UPPER ARM. REPLACE EVERY 10 DAYS AS DIRECTED   EPINEPHRINE  0.3 mg/0.3 mL IJ SOAJ injection INJECT ONE PEN INTO THE THIGH OR AS DIRECTED. AFTER ADMINISTRATION CALL 911. IF ANAPHYLACTIC SYMPTOMS PERSIST AFTER FIRST DOSE, MAY REPEAT DOSE IN 5 TO 15 MINUTES.   escitalopram  (LEXAPRO ) 20 MG tablet Take 1 tablet (20 mg total) by mouth daily.   ezetimibe  (ZETIA ) 10 MG tablet TAKE 1 TABLET DAILY   fluticasone  (FLONASE ) 50 MCG/ACT nasal spray Place 2 sprays into both nostrils daily.   Fremanezumab -vfrm (AJOVY ) 225 MG/1.5ML SOAJ Inject 225 mg into the skin every 30 (thirty) days.   levocetirizine (XYZAL ) 5 MG tablet TAKE 1 TABLET EVERY EVENING   LORazepam  (ATIVAN ) 0.5 MG tablet TAKE ONE TABLET BY MOUTH EVERY 8 HOURS   Lurasidone  HCl 120 MG  TABS Take 1 tablet (120 mg total) by mouth at bedtime.   magnesium  oxide (MAG-OX) 400 (240 Mg) MG tablet Take 400 mg by mouth daily.   midodrine  (PROAMATINE ) 10 MG tablet Take 1 tablet (10 mg total) by mouth 3 (three) times daily.   MOUNJARO  7.5 MG/0.5ML Pen INJECT THE CONTENTS OF ONE PEN UNDER THE SKIN WEEKLY ON THE SAME DAY EACH WEEK   Multiple Vitamins-Minerals (MULTIVITAMIN WITH MINERALS) tablet Take 1 tablet by mouth daily. Daily Diabetes Health Pack - MVI + chromium, fish oil + vit D, magnesium , vit C, alpha lipoic acid w/ green tea   nitroGLYCERIN  (NITROSTAT ) 0.4 MG SL tablet Place 1 tablet (0.4 mg total) under the tongue every 5 (five) minutes as needed for chest pain.   ondansetron  (ZOFRAN ) 4 MG tablet Take 1 tablet (4 mg total) by mouth every 6 (six) hours.   ONETOUCH VERIO test strip USE DAILY TO CHECK BLOOD SUGAR (Patient taking differently: 1 each  by Other route See admin instructions. Use daily to check blood sugar.)   pantoprazole  (PROTONIX ) 40 MG tablet TAKE 1 TABLET DAILY   QUEtiapine  (SEROQUEL ) 300 MG tablet Take 1 tablet (300 mg total) by mouth at bedtime.   Rimegepant Sulfate  (NURTEC) 75 MG TBDP Take 1 tablet (75 mg total) by mouth as needed (take 1 tab at the earlist onset of a migraine. Max 1 tab in 24 hours).   topiramate  (TOPAMAX ) 50 MG tablet TAKE 3 TABLETS TWICE A DAY   traMADol  (ULTRAM ) 50 MG tablet Take 1 tablet (50 mg total) by mouth every 8 (eight) hours as needed for up to 5 days.     Allergies:   Bee venom, Onion, Tetanus-diphtheria toxoids td, Sumatriptan , Zolpidem , Asa [aspirin ], Iodine, Nsaids, Sulfa antibiotics, and Vancomycin   Social History   Socioeconomic History   Marital status: Widowed    Spouse name: Not on file   Number of children: 6   Years of education: Not on file   Highest education level: Associate degree: academic program  Occupational History   Occupation: not employed  Tobacco Use   Smoking status: Never    Passive exposure: Past   Smokeless tobacco: Never   Tobacco comments:    Never smoke 07/15/22  Vaping Use   Vaping status: Never Used  Substance and Sexual Activity   Alcohol use: Never    Comment: light social   Drug use: Never   Sexual activity: Not Currently  Other Topics Concern   Not on file  Social History Narrative   Lives in [place] with daughter and son-in-law.  Sews clothing in free time as hobby.    Social Drivers of Health   Financial Resource Strain: Medium Risk (03/04/2024)   Overall Financial Resource Strain (CARDIA)    Difficulty of Paying Living Expenses: Somewhat hard  Food Insecurity: No Food Insecurity (03/04/2024)   Hunger Vital Sign    Worried About Running Out of Food in the Last Year: Never true    Ran Out of Food in the Last Year: Never true  Transportation Needs: No Transportation Needs (03/04/2024)   PRAPARE - Scientist, research (physical sciences) (Medical): No    Lack of Transportation (Non-Medical): No  Physical Activity: Inactive (03/04/2024)   Exercise Vital Sign    Days of Exercise per Week: 0 days    Minutes of Exercise per Session: Not on file  Stress: Stress Concern Present (03/04/2024)   [employer] of Occupational Health - Occupational Stress Questionnaire  Feeling of Stress: Rather much  Social Connections: Moderately Isolated (03/04/2024)   Social Connection and Isolation Panel    Frequency of Communication with Friends and Family: More than three times a week    Frequency of Social Gatherings with Friends and Family: Never    Attends Religious Services: 1 to 4 times per year    Active Member of [employer] or Organizations: No    Attends Banker Meetings: Not on file    Marital Status: Widowed     Family History: The patient's family history includes Bipolar disorder in her daughter and son; Migraines in her daughter, daughter, and son. She was adopted. ROS:   Please see the history of present illness.    All 14 point review of systems negative except as described per history of present illness  EKGs/Labs/Other Studies Reviewed:         Recent Labs: 05/15/2023: B Natriuretic Peptide 62.1 01/11/2024: TSH 0.85 02/05/2024: Magnesium  1.8 03/05/2024: ALT 224; BUN 16; Creatinine, Ser 1.83; Hemoglobin 12.1; Platelets 341.0; Potassium 4.0; Sodium 140  Recent Lipid Panel    Component Value Date/Time   CHOL 121 11/28/2023 1117   CHOL 141 02/10/2021 0914   TRIG 164.0 (H) 11/28/2023 1117   HDL 31.90 (L) 11/28/2023 1117   HDL 26 (L) 02/10/2021 0914   CHOLHDL 4 11/28/2023 1117   VLDL 32.8 11/28/2023 1117   LDLCALC 56 11/28/2023 1117   LDLCALC 86 02/10/2021 0914    Physical Exam:    VS:  BP 112/62 (BP Location: Left Arm, Patient Position: Sitting, Cuff Size: Normal)   Pulse 76   Resp 20   Ht 5' 6 (1.676 m)   Wt 166 lb 12 oz (75.6 kg)   SpO2 94%   BMI 26.91 kg/m     Wt Readings from  Last 3 Encounters:  04/30/24 166 lb 12 oz (75.6 kg)  03/05/24 151 lb (68.5 kg)  02/14/24 157 lb (71.2 kg)     GEN:  Well nourished, well developed in no acute distress HEENT: Normal NECK: No JVD; No carotid bruits LYMPHATICS: No lymphadenopathy CARDIAC: RRR, no murmurs, no rubs, no gallops RESPIRATORY:  Clear to auscultation without rales, wheezing or rhonchi  ABDOMEN: Soft, non-tender, non-distended MUSCULOSKELETAL:  No edema; No deformity  SKIN: Warm and dry LOWER EXTREMITIES: no swelling NEUROLOGIC:  Alert and oriented x 3 PSYCHIATRIC:  Normal affect   ASSESSMENT:    1. Paroxysmal supraventricular tachycardia (HCC)   2. SVT (supraventricular tachycardia) (HCC)   3. Diabetes mellitus without complication (HCC)   4. Bipolar 1 disorder (HCC)    PLAN:    In order of problems listed above:  Paroxysmal atrial fibrillation on Eliquis  will continue. Orthostatic hypotension.  She is on midodrine  fluids as well as elevation of salt seems to be sufficiently control her problem we will continue. Diabetes mellitus that being followed by internal medicine team hemoglobin A1c however 4.7 from November 28, 2023. Cholesterol status she is on Lipitor  10 which I continue LDL 56 HDL 31.9 this is from November 28, 2023.  Good control we will continue   Medication Adjustments/Labs and Tests Ordered: Current medicines are reviewed at length with the patient today.  Concerns regarding medicines are outlined above.  No orders of the defined types were placed in this encounter.  Medication changes: No orders of the defined types were placed in this encounter.   Signed, Lamar DOROTHA Fitch, MD, [facility] 04/30/2024 2:57 PM    [facility]

## 2024-04-30 NOTE — Telephone Encounter (Signed)
Please see the MyChart message reply(ies) for my assessment and plan.  The patient gave consent for this Medical Advice Message and is aware that it may result in a bill to their insurance company as well as the possibility that this may result in a co-payment or deductible. They are an established patient, but are not seeking medical advice exclusively about a problem treated during an in person or video visit in the last 7 days. I did not recommend an in person or video visit within 7 days of my reply.  I spent a total of 6 minutes cumulative time within 7 days through MyChart messaging Karon Heckendorn Paul Hairo Garraway, DO  

## 2024-05-01 ENCOUNTER — Ambulatory Visit (HOSPITAL_BASED_OUTPATIENT_CLINIC_OR_DEPARTMENT_OTHER)
Admission: RE | Admit: 2024-05-01 | Discharge: 2024-05-01 | Disposition: A | Discharge status: Home / Self Care | Payer: Medicare (Managed Care) | Source: Ambulatory Visit | Attending: Acute Care | Admitting: Acute Care

## 2024-05-01 ENCOUNTER — Ambulatory Visit: Payer: Medicare (Managed Care) | Attending: Family Medicine | Admitting: Audiologist

## 2024-05-01 ENCOUNTER — Encounter: Payer: Self-pay | Admitting: Family Medicine

## 2024-05-01 ENCOUNTER — Other Ambulatory Visit: Payer: Self-pay | Admitting: Neurology

## 2024-05-01 ENCOUNTER — Ambulatory Visit: Payer: Medicare (Managed Care) | Admitting: Family Medicine

## 2024-05-01 ENCOUNTER — Other Ambulatory Visit: Payer: Self-pay | Admitting: Behavioral Health

## 2024-05-01 VITALS — BP 105/68 | HR 68 | Temp 98.0°F | Resp 16 | Ht 66.0 in | Wt 167.0 lb

## 2024-05-01 DIAGNOSIS — J3089 Other allergic rhinitis: Secondary | ICD-10-CM

## 2024-05-01 DIAGNOSIS — H903 Sensorineural hearing loss, bilateral: Secondary | ICD-10-CM | POA: Diagnosis not present

## 2024-05-01 DIAGNOSIS — E1165 Type 2 diabetes mellitus with hyperglycemia: Secondary | ICD-10-CM | POA: Diagnosis not present

## 2024-05-01 DIAGNOSIS — E782 Mixed hyperlipidemia: Secondary | ICD-10-CM | POA: Diagnosis not present

## 2024-05-01 DIAGNOSIS — T466X5A Adverse effect of antihyperlipidemic and antiarteriosclerotic drugs, initial encounter: Secondary | ICD-10-CM

## 2024-05-01 DIAGNOSIS — M609 Myositis, unspecified: Secondary | ICD-10-CM

## 2024-05-01 DIAGNOSIS — F411 Generalized anxiety disorder: Secondary | ICD-10-CM

## 2024-05-01 DIAGNOSIS — F410 Panic disorder [episodic paroxysmal anxiety] without agoraphobia: Secondary | ICD-10-CM

## 2024-05-01 DIAGNOSIS — F3130 Bipolar disorder, current episode depressed, mild or moderate severity, unspecified: Secondary | ICD-10-CM

## 2024-05-01 DIAGNOSIS — J4530 Mild persistent asthma, uncomplicated: Secondary | ICD-10-CM | POA: Diagnosis not present

## 2024-05-01 DIAGNOSIS — R911 Solitary pulmonary nodule: Secondary | ICD-10-CM | POA: Insufficient documentation

## 2024-05-01 DIAGNOSIS — T7840XD Allergy, unspecified, subsequent encounter: Secondary | ICD-10-CM

## 2024-05-01 MED ORDER — MOUNJARO 7.5 MG/0.5ML ~~LOC~~ SOAJ: 7.5000 mg | SUBCUTANEOUS | 1 refills | Status: DC

## 2024-05-01 MED ORDER — BUDESONIDE-FORMOTEROL FUMARATE 80-4.5 MCG/ACT IN AERO: 2.0000 | INHALATION_SPRAY | Freq: Two times a day (BID) | RESPIRATORY_TRACT | 3 refills | Status: DC

## 2024-05-01 MED ORDER — FLUTICASONE PROPIONATE 50 MCG/ACT NA SUSP: 2.0000 | Freq: Every day | NASAL | 3 refills | Status: AC

## 2024-05-01 MED ORDER — LEVOCETIRIZINE DIHYDROCHLORIDE 5 MG PO TABS: 5.0000 mg | ORAL_TABLET | Freq: Every evening | ORAL | 3 refills | Status: AC

## 2024-05-01 NOTE — Patient Instructions (Signed)
 Please schedule your eye exam.  Give us  2-3 business days to get the results of your labs back.   Keep the diet clean and stay active.  Aim to do some physical exertion for 150 minutes per week. This is typically divided into 5 days per week, 30 minutes per day. The activity should be enough to get your heart rate up. Anything is better than nothing if you have time constraints.  Let us  know if you need anything.

## 2024-05-01 NOTE — Procedures (Signed)
  Outpatient Audiology and [facility] [address] [place], [place]  [address] [phone]  AUDIOLOGICAL  EVALUATION  NAME: Paula Stout     DOB:   03-11-1956      MRN: [id number]                                                                                     DATE: 05/01/2024     REFERENT: Frann Mabel Mt, DO STATUS: Outpatient DIAGNOSIS: Sensorineural Hearing Loss Bilateral    History: Janise was seen for an audiological evaluation due to difficulty hearing people on a daily basis. They sound muffled like they are mumbling. She is asking 'huh' anf 'what' often. She has constant ringing in both ears for many years. She has learned to live with it. The ringing starting after a plane ride. The hearing loss has slowly been progressing for years. Her first hearing test was 10 years ago when she was being screened by [employer]. That test was normal per her recollection.  Amrutha denies pain or pressure in either ear. [place] occupational history of hazardous noise exposure.  Medical history shows risk for hearing loss due to diabetes.    Evaluation:  Otoscopy showed a clear view of the tympanic membranes, bilaterally Tympanometry results were consistent with normal middle ear function, bilaterally   Audiometric testing was completed using Conventional Audiometry techniques with insert earphones and supraural headphones. Test results are consistent with  normal sloping to moderate sensorineural hearing loss bilaterally. Speech Recognition Thresholds were obtained at  15dB HL in the right ear and at 20dB HL in the left ear. Word Recognition Testing was completed at  40dB SL and Aalyiah scored 100% in each ear.    Results:  The test results were reviewed with Zebedee. Latajah has sloping sensorineural hearing loss bilaterally  consistent with age and noise related changes. She needs hearing aids for both ears.  Audiogram printed and provided to [place].     Recommendations: Hearing aids recommended for both ears. Patient given list of local hearing aid providers. She can also try [employer] and call her insurance to check for benefits.  Annual audiometric testing recommended to monitor hearing loss for progression.    32 minutes spent testing and counseling on results.   If you have any questions please feel free to contact me at [phone].  Lauraine Ka Stalnaker Au.D.  Audiologist   05/01/2024  9:36 AM  Cc: Frann Mabel Mt, DO

## 2024-05-01 NOTE — Progress Notes (Signed)
 Subjective:   Chief Complaint  Patient presents with   Medication Refill    Medication Check    Paula Stout is a 68 y.o. female here for follow-up of diabetes.   Kariyah's self monitored glucose range is 90-low 100's.  Patient confirms hypoglycemic reactions in the 70's where she feels fatigued and in a mental fog.  She has a CGM Patient does not require insulin .   Medications include: Mounjaro  7.5 mg/week Diet is OK, does not eat much.  Exercise: none  Hyperlipidemia Patient presents for mixed hyperlipidemia follow up. Currently being treated with Zetia  10 mg/d and compliance with treatment thus far has been good. She does not tolerate statins. She denies myalgias. Diet/exercise as above. No new CP or SOB.  The patient is not known to have coexisting coronary artery disease.   Past Medical History:  Diagnosis Date   Adhesive capsulitis of left shoulder 12/16/2016   Asthma without status asthmaticus 02/06/2021   At risk for falls 02/04/2019   Atrial fibrillation (HCC) 09/01/2014   Last Assessment & Plan:  Formatting of this note might be different from the original. A:  Chronic.  Sinus rhythm at this time.  States she was told this during admission at [facility] in the past. P:  On baby aspirin  at home will continue.  Low dose metoprolol  started.   Bipolar 1 disorder, depressed, moderate (HCC) 02/18/2020   Capsulitis of left shoulder 10/20/2021   Chronic anticoagulation 02/20/2020   Chronic atrial fibrillation (HCC) 02/18/2020   Chronic headache 10/11/2013   Chronic interstitial cystitis 12/28/2005   Chronic migraine without aura, intractable, without status migrainosus 06/24/2017   Colon cancer screening 04/15/2016   Last Assessment & Plan:  Formatting of this note might be different from the original. Will schedule for colonoscopy   Depression, recurrent (HCC) 02/18/2020   Diabetes mellitus (HCC)    Dyslipidemia 02/20/2020   GAD (generalized anxiety disorder)  02/18/2020   GERD (gastroesophageal reflux disease) 11/05/2021   H/O chest pain 07/31/2021   History of atrial fibrillation 06/24/2017   History of posttraumatic stress disorder (PTSD) 10/07/2016   Hyperosmolarity due to secondary diabetes mellitus (HCC) 02/06/2021   Impaired mobility and activities of daily living 11/14/2015   Irritable bowel syndrome (IBS)    Junctional tachycardia (HCC) 11/05/2021   Kidney stone 02/06/2021   Migraine without aura, not refractory 02/06/2021   Mixed hyperlipidemia 04/06/2021   Last Assessment & Plan:  Formatting of this note might be different from the original. 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 Controlled   Refractory migraine with aura 02/06/2021   Refractory migraine without aura 02/06/2021   Renal colic on left side 04/22/2017   Stage 3 chronic kidney disease (HCC) 02/20/2020   Thyroid  goiter    10/17/23 CT: right thyorid goiter with substernal extension, benign FNA 07/07/23   Unspecified adverse effect of other drug, medicinal and biological substance(995.29) 02/17/2006   Urinary tract obstruction 02/06/2021     Related testing: Retinal exam: Due Pneumovax: done  Objective:  BP 105/68 (BP Location: Left Arm, Patient Position: Sitting)   Pulse 68   Temp 98 F (36.7 C) (Oral)   Resp 16   Ht 5' 6 (  1.676 m)   Wt 167 lb (75.8 kg)   SpO2 98%   BMI 26.95 kg/m  General:  Well developed, well nourished, in no apparent distress Skin:  Warm, no pallor or diaphoresis on exposed skin Head:  Normocephalic, atraumatic Eyes:  Pupils equal and round, sclera anicteric without injection  Lungs:  CTAB, no access msc  use Cardio:  RRR, no bruits, no LE edema Musculoskeletal:  Symmetrical muscle groups noted without atrophy or deformity Neuro:  Sensation intact to pinprick on feet but not the toes Psych: Age appropriate judgment and insight  Assessment:   Type 2 diabetes mellitus with hyperglycemia, without long-term current use of insulin  (HCC) - Plan: tirzepatide  (MOUNJARO ) 7.5 MG/0.5ML Pen, Microalbumin / creatinine urine ratio, Lipid panel, Hemoglobin A1c, CBC, Comprehensive metabolic panel with GFR  Mixed hyperlipidemia  Mild persistent asthma without complication - Plan: budesonide -formoterol  (SYMBICORT ) 80-4.5 MCG/ACT inhaler  Allergy, subsequent encounter - Plan: levocetirizine (XYZAL ) 5 MG tablet, fluticasone  (FLONASE ) 50 MCG/ACT nasal spray  Statin-induced myositis   Plan:   Chronic, stable.  Continue Mounjaro  7.5 mg weekly.  She is having some hypoglycemia, could consider decreasing the dosage.  Counseled on diet and exercise.  Needs to schedule eye exam. Chronic, stable.  Continue Zetia  10 mg daily. Continue Symbicort  as started by pulmonology. She is moving to [place]  and we will not see her unless she needs this. The patient voiced understanding and agreement to the plan.  Mabel Mt [place], DO 05/01/24 2:44 PM

## 2024-05-02 ENCOUNTER — Other Ambulatory Visit: Payer: Self-pay | Admitting: Behavioral Health

## 2024-05-02 ENCOUNTER — Ambulatory Visit: Payer: Self-pay | Admitting: Family Medicine

## 2024-05-02 DIAGNOSIS — F410 Panic disorder [episodic paroxysmal anxiety] without agoraphobia: Secondary | ICD-10-CM

## 2024-05-02 DIAGNOSIS — R809 Proteinuria, unspecified: Secondary | ICD-10-CM | POA: Diagnosis not present

## 2024-05-02 DIAGNOSIS — I129 Hypertensive chronic kidney disease with stage 1 through stage 4 chronic kidney disease, or unspecified chronic kidney disease: Secondary | ICD-10-CM | POA: Diagnosis not present

## 2024-05-02 DIAGNOSIS — F3130 Bipolar disorder, current episode depressed, mild or moderate severity, unspecified: Secondary | ICD-10-CM

## 2024-05-02 DIAGNOSIS — F411 Generalized anxiety disorder: Secondary | ICD-10-CM

## 2024-05-02 DIAGNOSIS — N183 Chronic kidney disease, stage 3 unspecified: Secondary | ICD-10-CM | POA: Diagnosis not present

## 2024-05-02 LAB — CBC
HCT: 34.8 % — ABNORMAL LOW (ref 36.0–46.0)
Hemoglobin: 11.4 g/dL — ABNORMAL LOW (ref 12.0–15.0)
MCHC: 32.7 g/dL (ref 30.0–36.0)
MCV: 96.3 fl (ref 78.0–100.0)
Platelets: 286 K/uL (ref 150.0–400.0)
RBC: 3.61 Mil/uL — ABNORMAL LOW (ref 3.87–5.11)
RDW: 14.6 % (ref 11.5–15.5)
WBC: 7.1 K/uL (ref 4.0–10.5)

## 2024-05-02 LAB — LIPID PANEL
Cholesterol: 131 mg/dL (ref 0–200)
HDL: 51.8 mg/dL (ref >39.00)
LDL Cholesterol: 64 mg/dL (ref 0–99)
NonHDL: 79.28
Total CHOL/HDL Ratio: 3
Triglycerides: 77 mg/dL (ref 0.0–149.0)
VLDL: 15.4 mg/dL (ref 0.0–40.0)

## 2024-05-02 LAB — COMPREHENSIVE METABOLIC PANEL WITH GFR
ALT: 12 U/L (ref 0–35)
AST: 19 U/L (ref 0–37)
Albumin: 3.6 g/dL (ref 3.5–5.2)
Alkaline Phosphatase: 106 U/L (ref 39–117)
BUN: 21 mg/dL (ref 6–23)
CO2: 24 meq/L (ref 19–32)
Calcium: 9.3 mg/dL (ref 8.4–10.5)
Chloride: 111 meq/L (ref 96–112)
Creatinine, Ser: 1.79 mg/dL — ABNORMAL HIGH (ref 0.40–1.20)
GFR: 29.17 mL/min — ABNORMAL LOW (ref >60.00)
Glucose, Bld: 88 mg/dL (ref 70–99)
Potassium: 4.5 meq/L (ref 3.5–5.1)
Sodium: 144 meq/L (ref 135–145)
Total Bilirubin: 0.3 mg/dL (ref 0.2–1.2)
Total Protein: 6.2 g/dL (ref 6.0–8.3)

## 2024-05-02 LAB — MICROALBUMIN / CREATININE URINE RATIO
Creatinine,U: 68.3 mg/dL
Microalb Creat Ratio: UNDETERMINED mg/g (ref 0.0–30.0)
Microalb, Ur: <0.7 mg/dL

## 2024-05-02 LAB — HEMOGLOBIN A1C: Hgb A1c MFr Bld: 5 % (ref 4.6–6.5)

## 2024-05-03 ENCOUNTER — Ambulatory Visit: Payer: Medicare (Managed Care) | Admitting: Orthopaedic Surgery

## 2024-05-05 ENCOUNTER — Encounter: Payer: Self-pay | Admitting: Family Medicine

## 2024-05-08 ENCOUNTER — Telehealth: Payer: Self-pay

## 2024-05-08 NOTE — Telephone Encounter (Signed)
 Copied from CRM [id number]. Topic: Clinical - Order For Equipment >> May 08, 2024 10:54 AM Vena HERO wrote: Reason for CRM: Abby from [employer] is calling in to verify pt diabetes diagnosis. An order was put in for Dexcom and she needs more information such as blood sugar logs and length of time of diagnosis. Please call [phone] and ask for Abby

## 2024-05-08 NOTE — Telephone Encounter (Signed)
 Ask pt to send us  some blood sugar reading so we can update.

## 2024-05-09 ENCOUNTER — Other Ambulatory Visit: Payer: Self-pay | Admitting: Neurology

## 2024-05-09 ENCOUNTER — Encounter: Payer: Self-pay | Admitting: Behavioral Health

## 2024-05-09 ENCOUNTER — Ambulatory Visit (INDEPENDENT_AMBULATORY_CARE_PROVIDER_SITE_OTHER): Payer: Medicare (Managed Care) | Admitting: Behavioral Health

## 2024-05-09 DIAGNOSIS — F411 Generalized anxiety disorder: Secondary | ICD-10-CM

## 2024-05-09 DIAGNOSIS — F331 Major depressive disorder, recurrent, moderate: Secondary | ICD-10-CM | POA: Diagnosis not present

## 2024-05-09 DIAGNOSIS — F319 Bipolar disorder, unspecified: Secondary | ICD-10-CM

## 2024-05-09 NOTE — Progress Notes (Signed)
 [facility] Counselor/Therapist Progress Note  Patient ID: Paula Stout, MRN: [id number],    Date: 05/09/2024 Time Spent: 1:02 PM until 1:57 PM, 55 minutes. The patient consented to the video teletherapy and was located in her home during this session. She is aware it is the responsibility of the patient to secure confidentiality on her end of the session. The provider was in a private home  for the duration of this session.      Treatment Type: Individual Therapy  Reported Symptoms: Anxiety, depression  Mental Status Exam: Appearance:  Casual     Behavior: Appropriate  Motor: Normal  Speech/Language:  Normal Rate  Affect: Appropriate  Mood: normal  Thought process: normal  Thought content:   WNL  Sensory/Perceptual disturbances:   WNL  Orientation: oriented to person, place, time/date, situation, day of week, month of year, and year  Attention: Good  Concentration: Good  Memory: WNL  Fund of knowledge:  Good  Insight:   Good  Judgment:  Good  Impulse Control: Good   Risk Assessment: Danger to Self:  No Self-injurious Behavior: No Danger to Others: No Duty to Warn:no Physical Aggression / Violence:No  Access to Firearms a concern: No  Gang Involvement:No   Subjective: The patient has a target date of moving to [place]  of October 1.  Apartment will not be ready but she can live with one of her daughters until it is available in late October.  She is not getting much cooperation from the daughter and son all that she is living with.  Over the past 2 weeks that she is trying to pack things up and make arrangements to move she has had multiple panic attacks as well as migraines.  Her daughter reports that her own health issues The patient from going to the hospital for a migraine.  The patient said she gets very anxious going to the dentist and asked her daughter to take her but she would not so she had to drive herself.  She is trying to pack up the ends and  boxes to put in her car because she is having her car shipped and it can carry X amount of weight.  Her daughter has problems to help her carry things down the stairs and the pins but has not done so.  Encouraged her to ask again respectfully assertive way to least have her daughter slide the boxes down the stairs with her to get into the garage.  Her daughter would not let anyone come in the house to help the patient move the things because the patient ask if the neighbor could help do that.  She said the daughter does not want anyone to see the mess that the house is.  I encouraged the patient to continue using her coping skills and compartmentalize all the things that she has to do. She does contract for safety having no thoughts of hurting herself or anyone else. Diagnosis: Generalized anxiety disorder, major depressive disorder, moderate, recurrent Plan: I will meet with the patient weekly via care agility.  Treatment plan: We will use cognitive behavioral therapy as well as person centered and supportive therapy in addition to elements of dialectical behavior therapy to help reduce the patient's anxiety and depression by at least 50% with a target date of October 06, 2023.  Goals for improving depression may include having less sadness as indicated by patient report and scores on the PHQ-9, have improved mood and return to a healthier level of functioning,  identify causes including environment for depressed mood and learn ways to cope with depression especially those connected to her medical issues.  Interventions include using cognitive behavioral therapy to explore and replace thoughts and behaviors.  We will look at how depression is experienced in day-to-day living and encouraged sharing of feelings.  We will encourage the use of coping skills for management of depressive symptoms.  Goals for reducing anxiety are to improve her ability to manage anxiety symptoms, better handle stress, identify causes  for anxiety and explore ways to lower it, resolve the core conflicts contributing to anxiety as well as manage thoughts and worrisome thinking contributing to feelings of anxiety.  Interventions will include providing education about anxiety, facilitate problem solution skills to help her identify options for resolving stress, teach coping skills for managing anxiety as well as mindfulness and communication in terms of family to reduce her stress level, use cognitive behavior therapy to identify and change anxiety provoking thought and behavior patterns as well as teach distress tolerance and mindfulness skills for alleviating anxiety. Progress: 40% progress notes were reviewed with a new extended target date of  January 31st, 2026.  Lorrene CHRISTELLA Hasten, [facility]                                         Lorrene CHRISTELLA Hasten, [facility]               Lorrene CHRISTELLA Hasten, [facility]               Lorrene CHRISTELLA Hasten, [facility]               Lorrene CHRISTELLA Hasten, [facility]               Lorrene CHRISTELLA Hasten, [facility]               Lorrene CHRISTELLA Hasten, [facility]               Lorrene CHRISTELLA Hasten, [facility]               Lorrene CHRISTELLA Hasten, [facility]               Lorrene CHRISTELLA Hasten, [facility]               Lorrene CHRISTELLA Hasten, [facility]               Lorrene CHRISTELLA Hasten, [facility]               Lorrene CHRISTELLA Hasten, [facility]

## 2024-05-10 ENCOUNTER — Emergency Department (HOSPITAL_BASED_OUTPATIENT_CLINIC_OR_DEPARTMENT_OTHER): Payer: Medicare (Managed Care)

## 2024-05-10 ENCOUNTER — Encounter (HOSPITAL_BASED_OUTPATIENT_CLINIC_OR_DEPARTMENT_OTHER): Payer: Self-pay | Admitting: Emergency Medicine

## 2024-05-10 ENCOUNTER — Other Ambulatory Visit: Payer: Self-pay

## 2024-05-10 ENCOUNTER — Emergency Department (HOSPITAL_BASED_OUTPATIENT_CLINIC_OR_DEPARTMENT_OTHER)
Admission: EM | Admit: 2024-05-10 | Discharge: 2024-05-10 | Disposition: A | Discharge status: Home / Self Care | Payer: Medicare (Managed Care) | Attending: Emergency Medicine | Admitting: Emergency Medicine

## 2024-05-10 DIAGNOSIS — R0602 Shortness of breath: Secondary | ICD-10-CM | POA: Diagnosis not present

## 2024-05-10 DIAGNOSIS — N183 Chronic kidney disease, stage 3 unspecified: Secondary | ICD-10-CM | POA: Insufficient documentation

## 2024-05-10 DIAGNOSIS — E0842 Diabetes mellitus due to underlying condition with diabetic polyneuropathy: Secondary | ICD-10-CM | POA: Insufficient documentation

## 2024-05-10 DIAGNOSIS — I129 Hypertensive chronic kidney disease with stage 1 through stage 4 chronic kidney disease, or unspecified chronic kidney disease: Secondary | ICD-10-CM | POA: Insufficient documentation

## 2024-05-10 DIAGNOSIS — R0789 Other chest pain: Secondary | ICD-10-CM | POA: Insufficient documentation

## 2024-05-10 DIAGNOSIS — J45909 Unspecified asthma, uncomplicated: Secondary | ICD-10-CM | POA: Insufficient documentation

## 2024-05-10 DIAGNOSIS — E1122 Type 2 diabetes mellitus with diabetic chronic kidney disease: Secondary | ICD-10-CM | POA: Diagnosis not present

## 2024-05-10 DIAGNOSIS — G43809 Other migraine, not intractable, without status migrainosus: Secondary | ICD-10-CM | POA: Insufficient documentation

## 2024-05-10 LAB — CBC
HCT: 36.9 % (ref 36.0–46.0)
Hemoglobin: 12.1 g/dL (ref 12.0–15.0)
MCH: 31.1 pg (ref 26.0–34.0)
MCHC: 32.8 g/dL (ref 30.0–36.0)
MCV: 94.9 fL (ref 80.0–100.0)
Platelets: 262 K/uL (ref 150–400)
RBC: 3.89 MIL/uL (ref 3.87–5.11)
RDW: 13.5 % (ref 11.5–15.5)
WBC: 7.5 K/uL (ref 4.0–10.5)
nRBC: 0 % (ref 0.0–0.2)

## 2024-05-10 LAB — BASIC METABOLIC PANEL WITH GFR
Anion gap: 12 (ref 5–15)
BUN: 19 mg/dL (ref 8–23)
CO2: 20 mmol/L — ABNORMAL LOW (ref 22–32)
Calcium: 9.5 mg/dL (ref 8.9–10.3)
Chloride: 110 mmol/L (ref 98–111)
Creatinine, Ser: 1.84 mg/dL — ABNORMAL HIGH (ref 0.44–1.00)
GFR, Estimated: 30 mL/min — ABNORMAL LOW (ref >60)
Glucose, Bld: 96 mg/dL (ref 70–99)
Potassium: 3.6 mmol/L (ref 3.5–5.1)
Sodium: 141 mmol/L (ref 135–145)

## 2024-05-10 LAB — TROPONIN T, HIGH SENSITIVITY
Troponin T High Sensitivity: <15 ng/L (ref 0–19)
Troponin T High Sensitivity: <15 ng/L (ref 0–19)

## 2024-05-10 MED ORDER — HYDROMORPHONE HCL 1 MG/ML IJ SOLN
0.5000 mg | Freq: Once | INTRAMUSCULAR | Status: AC
  Administered 2024-05-10: 0.5 mg via INTRAVENOUS
  Filled 2024-05-10: qty 1

## 2024-05-10 MED ORDER — SODIUM CHLORIDE 0.9 % IV BOLUS
500.0000 mL | Freq: Once | INTRAVENOUS | Status: AC
  Administered 2024-05-10: 500 mL via INTRAVENOUS

## 2024-05-10 MED ORDER — METOCLOPRAMIDE HCL 5 MG/ML IJ SOLN
10.0000 mg | Freq: Once | INTRAMUSCULAR | Status: AC
  Administered 2024-05-10: 10 mg via INTRAVENOUS
  Filled 2024-05-10: qty 2

## 2024-05-10 MED ORDER — DEXAMETHASONE SODIUM PHOSPHATE 10 MG/ML IJ SOLN
10.0000 mg | Freq: Once | INTRAMUSCULAR | Status: AC
  Administered 2024-05-10: 10 mg via INTRAVENOUS
  Filled 2024-05-10: qty 1

## 2024-05-10 MED ORDER — DIPHENHYDRAMINE HCL 50 MG/ML IJ SOLN
25.0000 mg | Freq: Once | INTRAMUSCULAR | Status: AC
  Administered 2024-05-10: 25 mg via INTRAVENOUS
  Filled 2024-05-10: qty 1

## 2024-05-10 MED ORDER — MAGNESIUM SULFATE 2 GM/50ML IV SOLN
2.0000 g | Freq: Once | INTRAVENOUS | Status: AC
  Administered 2024-05-10: 2 g via INTRAVENOUS
  Filled 2024-05-10: qty 50

## 2024-05-10 NOTE — ED Provider Notes (Signed)
 Care assumed at shift change. Patient here with migraine and chest pains. Pending delta trop.  Physical Exam  BP 124/69   Pulse 61   Temp 98.6 F (37 C)   Resp 14   Ht 5' 6 (1.676 m)   Wt 70.3 kg   SpO2 99%   BMI 25.02 kg/m   Physical Exam  Procedures  Procedures  ED Course / MDM   Clinical Course as of 05/10/24 2332  Thu May 10, 2024  2329 Delta trop remains normal. Will discharge per primary team plan. PCP follow up, RTED for any other concerns.   [CS]    Clinical Course User Index [CS] Roselyn Carlin NOVAK, MD   Medical Decision Making Problems Addressed: Atypical chest pain: acute illness or injury Other migraine without status migrainosus, not intractable: chronic illness or injury with exacerbation, progression, or side effects of treatment  Amount and/or Complexity of Data Reviewed Labs:  Decision-making details documented in ED Course.  Risk Prescription drug management. Parenteral controlled substances.          Roselyn Carlin NOVAK, MD 05/10/24 [phone]

## 2024-05-10 NOTE — ED Triage Notes (Signed)
 Pt reports CP and tightness x 2 hours, n/v, ShoB, lightheadedness  Migraine x 4 days, no relief from typical meds

## 2024-05-10 NOTE — ED Provider Notes (Signed)
 Emergency Department Provider Note   I have reviewed the triage vital signs and the nursing notes.   HISTORY  Chief Complaint Chest Pain and Migraine   HPI Paula Stout is a 68 y.o. female past history reviewed below presents emergency department for evaluation of chest tightness with shortness of breath.  Symptoms been ongoing for the past couple of hours.  Patient has been struggling with migraine headaches for the past 4 days.  She follows with a neurologist who advised she come in for migraine cocktail but was unable to secure a ride.  Her migraine headache symptoms have continued.  Today while walking up the stairs she developed the sudden tightness in her chest which has improved although not completely gone.  She is very strictly compliant with her anticoagulant with her history of A-fib. Patient with a near syncope with CP onset but no falls or full LOC.   Past Medical History:  Diagnosis Date   Adhesive capsulitis of left shoulder 12/16/2016   Asthma without status asthmaticus 02/06/2021   At risk for falls 02/04/2019   Atrial fibrillation (HCC) 09/01/2014   Last Assessment & Plan:  Formatting of this note might be different from the original. A:  Chronic.  Sinus rhythm at this time.  States she was told this during admission at [facility] in the past. P:  On baby aspirin  at home will continue.  Low dose metoprolol  started.   Bipolar 1 disorder, depressed, moderate (HCC) 02/18/2020   Capsulitis of left shoulder 10/20/2021   Chronic anticoagulation 02/20/2020   Chronic atrial fibrillation (HCC) 02/18/2020   Chronic headache 10/11/2013   Chronic interstitial cystitis 12/28/2005   Chronic migraine without aura, intractable, without status migrainosus 06/24/2017   Colon cancer screening 04/15/2016   Last Assessment & Plan:  Formatting of this note might be different from the original. Will schedule for colonoscopy   Depression, recurrent (HCC) 02/18/2020   Diabetes mellitus  (HCC)    Dyslipidemia 02/20/2020   GAD (generalized anxiety disorder) 02/18/2020   GERD (gastroesophageal reflux disease) 11/05/2021   H/O chest pain 07/31/2021   History of atrial fibrillation 06/24/2017   History of posttraumatic stress disorder (PTSD) 10/07/2016   Hyperosmolarity due to secondary diabetes mellitus (HCC) 02/06/2021   Impaired mobility and activities of daily living 11/14/2015   Irritable bowel syndrome (IBS)    Junctional tachycardia (HCC) 11/05/2021   Kidney stone 02/06/2021   Migraine without aura, not refractory 02/06/2021   Mixed hyperlipidemia 04/06/2021   Last Assessment & Plan:  Formatting of this note might be different from the original. 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 Controlled   Refractory migraine with aura 02/06/2021   Refractory migraine without aura 02/06/2021   Renal colic on left side 04/22/2017   Stage 3 chronic kidney disease (HCC) 02/20/2020   Thyroid  goiter    10/17/23 CT: right thyorid goiter with substernal extension, benign FNA 07/07/23   Unspecified adverse effect of other drug, medicinal and biological substance(995.29) 02/17/2006   Urinary tract obstruction 02/06/2021    Review of Systems  Constitutional: No fever/chills Cardiovascular: Positive chest pain. Respiratory: Positive shortness of breath. Gastrointestinal: No abdominal pain.  Positive nausea and  vomiting.  Skin: Negative for rash. Neurological: Positive HA.   ____________________________________________   PHYSICAL EXAM:  VITAL SIGNS: ED Triage Vitals  Encounter Vitals Group     BP 05/10/24 2111 129/87     Pulse Rate 05/10/24 2111 79      Resp 05/10/24 2111 (!) 22     Temp 05/10/24 2111 98.6 F (37 C)     Temp src --      SpO2 05/10/24 2111 100 %     Weight 05/10/24 2111 155 lb (70.3 kg)     Height 05/10/24 2111 5' 6 (1.676 m)   Constitutional: Alert and oriented. Well appearing and in no acute distress. Eyes: Conjunctivae are normal. PERRL. EOMI. Head: Atraumatic. Nose: No congestion/rhinnorhea. Mouth/Throat: Mucous membranes are moist. Neck: No stridor.   Cardiovascular: Normal rate, regular rhythm. Good peripheral circulation. Grossly normal heart sounds.   Respiratory: Normal respiratory effort.  No retractions. Lungs CTAB. Gastrointestinal: Soft and nontender. No distention.  Musculoskeletal: No lower extremity tenderness nor edema. No gross deformities of extremities. Neurologic:  Normal speech and language. No gross focal neurologic deficits are appreciated.  Skin:  Skin is warm, dry and intact. No rash noted.   ____________________________________________   LABS (all labs ordered are listed, but only abnormal results are displayed)  Labs Reviewed  CBC  BASIC METABOLIC PANEL WITH GFR  TROPONIN T, HIGH SENSITIVITY   ____________________________________________  EKG   EKG Interpretation Date/Time:  Thursday May 10 2024 21:17:41 EDT Ventricular Rate:  76 PR Interval:    QRS Duration:  102 QT Interval:  390 QTC Calculation: 439 R Axis:   -27  Text Interpretation: Atrial fibrillation Borderline left axis deviation Abnormal R-wave progression, late transition Borderline T wave abnormalities Confirmed by Darra Chew [phone]) on 05/10/2024 9:23:31 PM        ____________________________________________  RADIOLOGY  No results found.  ____________________________________________   PROCEDURES  Procedure(s) performed:   Procedures   ____________________________________________   INITIAL IMPRESSION / ASSESSMENT AND PLAN / ED COURSE  Pertinent labs & imaging results that were  available during my care of the patient were reviewed by me and considered in my medical decision making (see chart for details).   This patient is Presenting for Evaluation of CP, which does require a range of treatment options, and is a complaint that involves a high risk of morbidity and mortality.  The Differential Diagnoses includes but is not exclusive to acute coronary syndrome, aortic dissection, pulmonary embolism, cardiac tamponade, community-acquired pneumonia, pericarditis, musculoskeletal chest wall pain, etc.   Critical Interventions-    Medications  sodium chloride  0.9 % bolus 500 mL (500 mLs Intravenous New Bag/Given 05/10/24 2143)  metoCLOPramide  (REGLAN ) injection 10 mg (10 mg Intravenous Given 05/10/24 2140)  diphenhydrAMINE  (BENADRYL ) injection 25 mg (25 mg Intravenous Given 05/10/24 2140)  dexamethasone  (DECADRON ) injection 10 mg (10 mg Intravenous Given 05/10/24 2141)  HYDROmorphone  (DILAUDID ) injection 0.5 mg (0.5 mg Intravenous Given 05/10/24 2142)    Reassessment after intervention:  pain improved.    Clinical Laboratory Tests Ordered, included CBC without leukocytosis or anemia. ***  Radiologic Tests Ordered, included CXR. I independently interpreted the images and agree with radiology interpretation.   Cardiac Monitor Tracing which shows A fib (rate controlled).   Social Determinants of Health Risk patient is a non-smoker.   Consult complete with  Medical Decision Making: Summary:  Patient presents to the emergency department for evaluation of acute onset chest pain.  Lower suspicion for PE in the setting of anticoagulated status.  Vital signs are not consistent with PE either.  Plan for migraine cocktail for headache symptoms.  No complex symptoms to prompt neuroimaging.  Reevaluation with update and discussion with   ***Considered admission***  Patient's presentation is most consistent with acute presentation with potential threat to life or bodily function.    Disposition:   ____________________________________________  FINAL CLINICAL IMPRESSION(S) / ED DIAGNOSES  Final diagnoses:  None     NEW OUTPATIENT MEDICATIONS STARTED DURING THIS VISIT:  New Prescriptions   No medications on file    Note:  This document was prepared using Dragon voice recognition software and may include unintentional dictation errors.  Fonda Law, MD, [facility]

## 2024-05-11 ENCOUNTER — Ambulatory Visit (HOSPITAL_BASED_OUTPATIENT_CLINIC_OR_DEPARTMENT_OTHER): Payer: Medicare (Managed Care)

## 2024-05-14 ENCOUNTER — Other Ambulatory Visit: Payer: Self-pay

## 2024-05-14 ENCOUNTER — Other Ambulatory Visit: Payer: Self-pay | Admitting: Pharmacy Technician

## 2024-05-14 NOTE — Progress Notes (Signed)
 Specialty Pharmacy Refill Coordination Note  Paula Stout is a 68 y.o. female contacted today regarding refills of specialty medication(s) OnabotulinumtoxinA  (BOTOX )   Patient requested Courier to Provider Office   Delivery date: 05/21/24   Verified address: [facility] [address]   Medication will be filled on 05/18/24.  Injection Appointment 05/25/24.  Copay $0.

## 2024-05-15 ENCOUNTER — Other Ambulatory Visit (INDEPENDENT_AMBULATORY_CARE_PROVIDER_SITE_OTHER): Payer: Medicare (Managed Care)

## 2024-05-15 ENCOUNTER — Telehealth: Payer: Self-pay

## 2024-05-15 ENCOUNTER — Ambulatory Visit: Payer: Medicare (Managed Care) | Admitting: Orthopaedic Surgery

## 2024-05-15 DIAGNOSIS — G8929 Other chronic pain: Secondary | ICD-10-CM | POA: Diagnosis not present

## 2024-05-15 DIAGNOSIS — M25511 Pain in right shoulder: Secondary | ICD-10-CM | POA: Diagnosis not present

## 2024-05-15 NOTE — Telephone Encounter (Unsigned)
 Copied from CRM [id number]. Topic: Clinical - Order For Equipment >> May 08, 2024 10:54 AM Vena HERO wrote: Reason for CRM: Abby from [employer] is calling in to verify pt diabetes diagnosis. An order was put in for Dexcom and she needs more information such as blood sugar logs and length of time of diagnosis. Please call [phone] and ask for Abby >> May 15, 2024  2:48 PM Robinson DEL wrote: Abby with [employer] following up on paperwork needed of patients blood sugar log, hasn't received it yet so they can approve patients device, please reach out with that information  Abby [phone]

## 2024-05-15 NOTE — Progress Notes (Signed)
 Office Visit Note   Patient: Paula Stout           Date of Birth: 04-29-1956           MRN: [id number] Visit Date: 05/15/2024              Requested by: Frann Mabel Mt, DO [address] [place],  [place] [address] PCP: Frann Mabel Mt, DO   Assessment & Plan: Visit Diagnoses:  1. Chronic right shoulder pain     Plan: History of Present Illness Ninfa Giannelli is a 68 year old female who presents with shoulder pain following a fall.  Approximately six weeks ago, she [place], landing directly on her shoulder. She has experienced persistent soreness in the shoulder since the incident. She has not undergone any physical therapy. No other symptoms related to the shoulder injury are present.  Physical Exam MUSCULOSKELETAL: Normal shoulder flexibility and range of motion with tenderness over the acromioclavicular joint.  She has slight pain and weakness with manual muscle testing of the supraspinatus and infraspinatus.  Assessment and Plan Right shoulder pain, status post healed distal clavicle fracture with possible rotator cuff strain Pain persists six weeks post-injury. X-rays confirm fracture healing. Tenderness over AC joint and pain during rotator cuff testing suggest possible strain. - Refer to physical therapy at [facility] on [address]. - Advise follow-up if symptoms persist or worsen.  Follow-Up Instructions: No follow-ups on file.   Orders:  Orders Placed This Encounter  Procedures   XR Clavicle Right   Ambulatory referral to Physical Therapy   No orders of the defined types were placed in this encounter.     Procedures: No procedures performed   Clinical Data: No additional findings.   Subjective: Chief Complaint  Patient presents with   Right Shoulder - Pain    HPI  Review of Systems  Constitutional: Negative.   HENT: Negative.    Eyes: Negative.   Respiratory:  Negative.    Cardiovascular: Negative.   Endocrine: Negative.   Musculoskeletal: Negative.   Neurological: Negative.   Hematological: Negative.   Psychiatric/Behavioral: Negative.    All other systems reviewed and are negative.    Objective: Vital Signs: There were no vitals taken for this visit.  Physical Exam Vitals and nursing note reviewed.  Constitutional:      Appearance: She is well-developed.  HENT:     Head: Atraumatic.     Nose: Nose normal.  Eyes:     Extraocular Movements: Extraocular movements intact.  Cardiovascular:     Pulses: Normal pulses.  Pulmonary:     Effort: Pulmonary effort is normal.  Abdominal:     Palpations: Abdomen is soft.  Musculoskeletal:     Cervical back: Neck supple.  Skin:    General: Skin is warm.     Capillary Refill: Capillary refill takes less than 2 seconds.  Neurological:     Mental Status: She is alert. Mental status is at baseline.  Psychiatric:        Behavior: Behavior normal.        Thought Content: Thought content normal.        Judgment: Judgment normal.     Ortho Exam  Specialty Comments:  No specialty comments available.  Imaging: XR Clavicle Right Result Date: 05/15/2024 X-rays of the right clavicle show arthritic changes of the [facility] joint.  No acute abnormalities.  Healed distal clavicle fracture.    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   Right thyroid  nodule 06/23/2023   Pulmonary nodule 1 cm or greater in diameter 06/08/2023   Hypercoagulable state due to paroxysmal atrial fibrillation (HCC) 11/24/2022   Pericarditis 10/27/2022   Junctional tachycardia (HCC) 11/05/2021   GERD (gastroesophageal reflux disease) 11/05/2021   Paroxysmal supraventricular tachycardia (HCC) 11/05/2021   Capsulitis of left  shoulder 10/20/2021   ACS (acute coronary syndrome) (HCC) 07/31/2021   Primary hypertension 04/06/2021   Mixed hyperlipidemia 04/06/2021   Hyperglycemia due to type 2 diabetes mellitus (HCC) 02/06/2021   Hyperosmolarity due to secondary diabetes mellitus (HCC) 02/06/2021   Insomnia 02/06/2021   Kidney stone 02/06/2021   Polyneuropathy due to type 2 diabetes mellitus (HCC) 02/06/2021   Urinary tract obstruction 02/06/2021   Asthma without status asthmaticus 02/06/2021   Migraine without aura, not refractory 02/06/2021   Refractory migraine with aura 02/06/2021   Refractory migraine without aura 02/06/2021   Depressed bipolar I disorder (HCC) 02/06/2021   SVT (supraventricular tachycardia) (HCC)    Paroxysmal atrial fibrillation (HCC)    Bipolar disorder (HCC)    Diabetes mellitus without complication (HCC)    Diabetes mellitus due to underlying condition with unspecified complications (HCC) 04/14/2020   Anxiety    Depression    Diabetes mellitus (HCC)    Irritable bowel syndrome (IBS)    Dyslipidemia 02/20/2020   Chronic anticoagulation 02/20/2020   Stage 3 chronic kidney disease (HCC) 02/20/2020   Chest pain 02/19/2020   Hypertriglyceridemia 02/18/2020   Depression, recurrent (HCC) 02/18/2020   Bipolar 1 disorder, depressed, moderate (HCC) 02/18/2020   GAD (generalized anxiety disorder) 02/18/2020   At risk for falls 02/04/2019   Chronic migraine without aura, intractable, without status migrainosus 06/24/2017   History of atrial fibrillation 06/24/2017   Acute cystitis 05/14/2017   Renal colic on left side 04/22/2017   Adhesive capsulitis of left shoulder 12/16/2016   Morbid obesity (HCC) 12/16/2016   History of posttraumatic stress disorder (PTSD) 10/07/2016   Colon cancer screening 04/15/2016   Heartburn 04/15/2016   Atrial fibrillation (HCC) 09/01/2014   Obesity (BMI 30-39.9) 09/01/2014   Asthma 09/01/2014   Unstable angina (HCC) 09/01/2014   HLD (hyperlipidemia)  09/01/2014   Type 2 diabetes mellitus with obesity (HCC) 10/11/2013   Migraines 10/11/2013   Bipolar 1 disorder (HCC) 10/11/2013   Chronic headache 10/11/2013   Dysmetabolic syndrome X 03/20/2008   Unspecified adverse effect of other drug, medicinal and biological substance(995.29) 02/17/2006   Chronic interstitial cystitis 12/28/2005   Pneumonia, organism unspecified(486) 11/21/2002   Past Medical History:  Diagnosis Date   Adhesive capsulitis of left shoulder 12/16/2016   Asthma without status asthmaticus 02/06/2021   At risk for falls 02/04/2019   Atrial fibrillation (HCC) 09/01/2014   Last Assessment & Plan:  Formatting of this note might be different from the original. A:  Chronic.  Sinus rhythm at this time.  States she was told this during admission at [facility] in the past. P:  On baby aspirin  at home will continue.  Low dose metoprolol  started.   Bipolar 1 disorder, depressed, moderate (HCC) 02/18/2020   Capsulitis of left shoulder 10/20/2021   Chronic anticoagulation 02/20/2020   Chronic atrial fibrillation (HCC) 02/18/2020   Chronic headache 10/11/2013   Chronic interstitial cystitis 12/28/2005   Chronic migraine  without aura, intractable, without status migrainosus 06/24/2017   Colon cancer screening 04/15/2016   Last Assessment & Plan:  Formatting of this note might be different from the original. Will schedule for colonoscopy   Depression, recurrent (HCC) 02/18/2020   Diabetes mellitus (HCC)    Dyslipidemia 02/20/2020   GAD (generalized anxiety disorder) 02/18/2020   GERD (gastroesophageal reflux disease) 11/05/2021   H/O chest pain 07/31/2021   History of atrial fibrillation 06/24/2017   History of posttraumatic stress disorder (PTSD) 10/07/2016   Hyperosmolarity due to secondary diabetes mellitus (HCC) 02/06/2021   Impaired mobility and activities of daily living 11/14/2015   Irritable bowel syndrome (IBS)    Junctional tachycardia (HCC) 11/05/2021   Kidney stone  02/06/2021   Migraine without aura, not refractory 02/06/2021   Mixed hyperlipidemia 04/06/2021   Last Assessment & Plan:  Formatting of this note might be different from the original. 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 Controlled   Refractory migraine with aura 02/06/2021   Refractory migraine without aura 02/06/2021   Renal colic on left side 04/22/2017   Stage 3 chronic kidney disease (HCC) 02/20/2020   Thyroid  goiter    10/17/23 CT: right thyorid goiter with substernal extension, benign FNA 07/07/23   Unspecified adverse effect of other drug, medicinal and biological substance(995.29) 02/17/2006   Urinary tract obstruction 02/06/2021    Family History  Adopted: Yes  Problem Relation Age of Onset   Migraines Daughter    Bipolar disorder Daughter    Migraines Daughter    Migraines Son    Bipolar disorder Son     Past Surgical History:  Procedure Laterality Date   ABDOMINAL HYSTERECTOMY     Fibroids   ATRIAL FIBRILLATION ABLATION     ATRIAL FIBRILLATION ABLATION N/A 10/27/2022   Procedure: ATRIAL FIBRILLATION ABLATION;  Surgeon: Inocencio Soyla Lunger, MD;  Location: [facility] INVASIVE CV LAB;  Service: Cardiovascular;  Laterality: N/A;   BRONCHIAL BIOPSY  11/08/2023   Procedure: BRONCHOSCOPY, WITH BIOPSY;  Surgeon: Shelah Lamar RAMAN, MD;  Location: [facility] ENDOSCOPY;  Service: Pulmonary;;   BRONCHIAL BRUSHINGS  11/08/2023   Procedure: BRONCHOSCOPY, WITH BRUSH BIOPSY;  Surgeon: Shelah Lamar RAMAN, MD;  Location: [facility] ENDOSCOPY;  Service: Pulmonary;;   BRONCHIAL NEEDLE ASPIRATION BIOPSY  11/08/2023   Procedure: BRONCHOSCOPY, WITH NEEDLE ASPIRATION BIOPSY;  Surgeon: Shelah Lamar RAMAN, MD;  Location: [facility] ENDOSCOPY;  Service: Pulmonary;;   BRONCHIAL WASHINGS  11/08/2023   Procedure: IRRIGATION, BRONCHUS;  Surgeon: Shelah Lamar RAMAN, MD;  Location: [facility] ENDOSCOPY;  Service: Pulmonary;;   CARDIAC CATHETERIZATION     CARDIOVERSION     CESAREAN SECTION     KNEE SURGERY     LEFT HEART CATH AND CORONARY ANGIOGRAPHY N/A 03/04/2020   Procedure: LEFT HEART CATH AND CORONARY ANGIOGRAPHY;  Surgeon: Claudene Victory ORN, MD;  Location: [facility] INVASIVE CV LAB;  Service: Cardiovascular;  Laterality: N/A;   ROTATOR CUFF REPAIR     SVT ABLATION N/A 05/04/2023   Procedure: SVT ABLATION;  Surgeon: Inocencio Soyla Lunger, MD;  Location: [facility] INVASIVE CV LAB;  Service: Cardiovascular;  Laterality: N/A;   Social History   Occupational History   Occupation: not employed  Tobacco Use  Smoking status: Never    Passive exposure: Past   Smokeless tobacco: Never   Tobacco comments:    Never smoke 07/15/22  Vaping Use   Vaping status: Never Used  Substance and Sexual Activity   Alcohol use: Never    Comment: light social   Drug use: Never   Sexual activity: Not Currently

## 2024-05-15 NOTE — Telephone Encounter (Signed)
 Copied from CRM [id number]. Topic: Clinical - Order For Equipment >> May 08, 2024 10:54 AM Vena HERO wrote: Reason for CRM: Abby from [employer] is calling in to verify pt diabetes diagnosis. An order was put in for Dexcom and she needs more information such as blood sugar logs and length of time of diagnosis. Please call [phone] and ask for Abby >> May 15, 2024  2:48 PM Robinson DEL wrote: Abby with [employer] following up on paperwork needed of patients blood sugar log, hasn't received it yet so they can approve patients device, please reach out with that information  Abby [phone]   Forms has been sent back and pt has moved.

## 2024-05-16 ENCOUNTER — Encounter: Payer: Self-pay | Admitting: Family Medicine

## 2024-05-17 ENCOUNTER — Encounter: Payer: Self-pay | Admitting: Behavioral Health

## 2024-05-17 ENCOUNTER — Other Ambulatory Visit: Payer: Self-pay

## 2024-05-17 ENCOUNTER — Ambulatory Visit (INDEPENDENT_AMBULATORY_CARE_PROVIDER_SITE_OTHER): Payer: Medicare (Managed Care) | Admitting: Behavioral Health

## 2024-05-17 DIAGNOSIS — F411 Generalized anxiety disorder: Secondary | ICD-10-CM | POA: Diagnosis not present

## 2024-05-17 DIAGNOSIS — F331 Major depressive disorder, recurrent, moderate: Secondary | ICD-10-CM

## 2024-05-17 NOTE — Progress Notes (Signed)
 [facility] Counselor/Therapist Progress Note  Patient ID: Paula Stout, MRN: [id number],    Date: 05/17/2024 Time Spent: 3pm until 3:55 pm, 55 minutes. The patient consented to the video teletherapy and was located in her home during this session. She is aware it is the responsibility of the patient to secure confidentiality on her end of the session. The provider was in a private home  for the duration of this session.      Treatment Type: Individual Therapy  Reported Symptoms: Anxiety, depression  Mental Status Exam: Appearance:  Casual     Behavior: Appropriate  Motor: Normal  Speech/Language:  Normal Rate  Affect: Appropriate  Mood: normal  Thought process: normal  Thought content:   WNL  Sensory/Perceptual disturbances:   WNL  Orientation: oriented to person, place, time/date, situation, day of week, month of year, and year  Attention: Good  Concentration: Good  Memory: WNL  Fund of knowledge:  Good  Insight:   Good  Judgment:  Good  Impulse Control: Good   Risk Assessment: Danger to Self:  No Self-injurious Behavior: No Danger to Others: No Duty to Warn:no Physical Aggression / Violence:No  Access to Firearms a concern: No  Gang Involvement:No   Subjective: The patient has had significant anxiety over the past week primarily related to preparing to move in the frustration that comes along with that.  She is packing some things up but still cannot get them down the stairs.  Her daughter has said she would help but has not helped yet.  Her 2 other daughters are now to the point they cannot come to help and even then she does not think the daughter that she lives with would let her other daughters in.  She feels that if she can get things down the steps into the garage her daughter would let someone in the garage.  We talked about ways she could safely get them down the stairs as she gets the totes packed up without hurting herself or putting herself in  danger going down the stairs.  We talked about some different ways that she could coach getting her help in a different way.  She said that she had a migraine last week and her daughter would not take her to her doctors office so she had to go to the emergency room which resulted in a significantly more expensive bill to get her migraine cocktail.  Monitor this is short-term and to try and compartmentalize getting a little bit done every day while using her coping skills to reduce stress and anxiety.  She does contract for safety having no thoughts of hurting herself or anyone else. Diagnosis: Generalized anxiety disorder, major depressive disorder, moderate, recurrent Plan: I will meet with the patient weekly via care agility.  Treatment plan: We will use cognitive behavioral therapy as well as person centered and supportive therapy in addition to elements of dialectical behavior therapy to help reduce the patient's anxiety and depression by at least 50% with a target date of October 06, 2023.  Goals for improving depression may include having less sadness as indicated by patient report and scores on the PHQ-9, have improved mood and return to a healthier level of functioning, identify causes including environment for depressed mood and learn ways to cope with depression especially those connected to her medical issues.  Interventions include using cognitive behavioral therapy to explore and replace thoughts and behaviors.  We will look at how depression is experienced in day-to-day living  and encouraged sharing of feelings.  We will encourage the use of coping skills for management of depressive symptoms.  Goals for reducing anxiety are to improve her ability to manage anxiety symptoms, better handle stress, identify causes for anxiety and explore ways to lower it, resolve the core conflicts contributing to anxiety as well as manage thoughts and worrisome thinking contributing to feelings of anxiety.   Interventions will include providing education about anxiety, facilitate problem solution skills to help her identify options for resolving stress, teach coping skills for managing anxiety as well as mindfulness and communication in terms of family to reduce her stress level, use cognitive behavior therapy to identify and change anxiety provoking thought and behavior patterns as well as teach distress tolerance and mindfulness skills for alleviating anxiety. Progress: 40% progress notes were reviewed with a new extended target date of  January 31st, 2026.  Lorrene CHRISTELLA Hasten, [facility]                                         Lorrene CHRISTELLA Hasten, [facility]               Lorrene CHRISTELLA Hasten, [facility]               Lorrene CHRISTELLA Hasten, [facility]               Lorrene CHRISTELLA Hasten, [facility]               Lorrene CHRISTELLA Hasten, [facility]               Lorrene CHRISTELLA Hasten, [facility]               Lorrene CHRISTELLA Hasten, [facility]               Lorrene CHRISTELLA Hasten, [facility]               Lorrene CHRISTELLA Hasten, [facility]               Lorrene CHRISTELLA Hasten, [facility]               Lorrene CHRISTELLA Hasten, [facility]               Lorrene CHRISTELLA Hasten, [facility]               Lorrene CHRISTELLA Hasten, [facility]

## 2024-05-17 NOTE — Telephone Encounter (Signed)
 Pt was scheduled for a hospital follow-up appt as per the decision tree for scheduling after she mentioned she was in the ER on 9/4. Its notated she needs cortisone injection as well.

## 2024-05-21 ENCOUNTER — Encounter: Payer: Self-pay | Admitting: Family Medicine

## 2024-05-21 ENCOUNTER — Ambulatory Visit: Payer: Medicare (Managed Care) | Admitting: Family Medicine

## 2024-05-21 VITALS — BP 128/86 | HR 94 | Temp 98.0°F | Resp 16 | Ht 66.0 in | Wt 160.6 lb

## 2024-05-21 DIAGNOSIS — G8929 Other chronic pain: Secondary | ICD-10-CM

## 2024-05-21 DIAGNOSIS — M25511 Pain in right shoulder: Secondary | ICD-10-CM | POA: Diagnosis not present

## 2024-05-21 MED ORDER — METHYLPREDNISOLONE ACETATE 40 MG/ML IJ SUSP
40.0000 mg | Freq: Once | INTRAMUSCULAR | Status: AC
  Administered 2024-05-21: 40 mg via INTRAMUSCULAR

## 2024-05-21 NOTE — Addendum Note (Signed)
 Addended by: Oday Ridings M on: 05/21/2024 12:47 PM   Modules accepted: Orders

## 2024-05-21 NOTE — Progress Notes (Signed)
 Musculoskeletal Exam  Patient: Paula Stout DOB: 1956/07/14  DOS: 05/21/2024  SUBJECTIVE:  Chief Complaint:   Chief Complaint  Patient presents with   Follow-up    Follow Up    Paula Stout is a 68 y.o.  female for evaluation and treatment of R shoulder pain.   Onset:  2 months ago. Fell and broke shoulder Location: top of shoulder Character:  aching  Progression of issue:  has worsened Associated symptoms: decreased ROM 2/2 pain No bruising, redness, swelling.  Treatment: to date has been [employer].   Neurovascular symptoms: no  Past Medical History:  Diagnosis Date   Adhesive capsulitis of left shoulder 12/16/2016   Asthma without status asthmaticus 02/06/2021   At risk for falls 02/04/2019   Atrial fibrillation (HCC) 09/01/2014   Last Assessment & Plan:  Formatting of this note might be different from the original. A:  Chronic.  Sinus rhythm at this time.  States she was told this during admission at [facility] in the past. P:  On baby aspirin  at home will continue.  Low dose metoprolol  started.   Bipolar 1 disorder, depressed, moderate (HCC) 02/18/2020   Capsulitis of left shoulder 10/20/2021   Chronic anticoagulation 02/20/2020   Chronic atrial fibrillation (HCC) 02/18/2020   Chronic headache 10/11/2013   Chronic interstitial cystitis 12/28/2005   Chronic migraine without aura, intractable, without status migrainosus 06/24/2017   Colon cancer screening 04/15/2016   Last Assessment & Plan:  Formatting of this note might be different from the original. Will schedule for colonoscopy   Depression, recurrent (HCC) 02/18/2020   Diabetes mellitus (HCC)    Dyslipidemia 02/20/2020   GAD (generalized anxiety disorder) 02/18/2020   GERD (gastroesophageal reflux disease) 11/05/2021   H/O chest pain 07/31/2021   History of atrial fibrillation 06/24/2017   History of posttraumatic stress disorder (PTSD) 10/07/2016   Hyperosmolarity due to secondary diabetes mellitus (HCC)  02/06/2021   Impaired mobility and activities of daily living 11/14/2015   Irritable bowel syndrome (IBS)    Junctional tachycardia (HCC) 11/05/2021   Kidney stone 02/06/2021   Migraine without aura, not refractory 02/06/2021   Mixed hyperlipidemia 04/06/2021   Last Assessment & Plan:  Formatting of this note might be different from the original. 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 Controlled   Refractory migraine with aura 02/06/2021   Refractory migraine without aura 02/06/2021   Renal colic on left side 04/22/2017   Stage 3 chronic kidney disease (HCC) 02/20/2020   Thyroid  goiter    10/17/23 CT: right thyorid goiter with substernal extension, benign FNA 07/07/23   Unspecified adverse effect of other drug, medicinal and biological substance(995.29) 02/17/2006   Urinary tract obstruction 02/06/2021    Objective: VITAL SIGNS: BP 128/86 (BP Location: Left Arm, Patient Position: Sitting)   Pulse 94   Temp 98 F (36.7 C) (Oral)   Resp 16   Ht 5' 6 (1.676 m)   Wt 160 lb 9.6 oz (72.8 kg)   SpO2 98%   BMI 25.92 kg/m  Constitutional: Well formed, well developed. No acute distress. Thorax & Lungs: No accessory muscle use Musculoskeletal: R shoulder.   Normal active range of motion: no.   Normal  passive range of motion: no Tenderness to palpation: yes, posterior deltoid Deformity: no Ecchymosis: no Tests positive:  Tests negative: Neurologic: Normal sensory function. No focal deficits noted. DTR's equal and symmetric in LE's. No clonus. Psychiatric: Normal mood. Age appropriate judgment and insight. Alert & oriented x 3.    Procedure Note; Shoulder bursa  injection Verbal consent obtained. The area was palpated, an area was marked just caudal to the acromion process laterally, and cleaned with alcohol x1. A 27-gauge needle was used to enter the joint laterally with ease. 40 mg of Depomedrol with 2 mL of 1% lidocaine  was injected. A bandage was placed.  The patient tolerated the procedure well. There were no complications noted.  Assessment:  Chronic right shoulder pain - Plan: PR ARTHROCENTESIS ASPIR&/INJ MAJOR JT/BURSA W/O US   Plan: Stretches/exercises, heat, ice, Tylenol .  F/u as originally scheduled. The patient voiced understanding and agreement to the plan.   Mabel Mt [place], DO 05/21/24  11:25 AM

## 2024-05-21 NOTE — Patient Instructions (Addendum)
 The shot will help with bursitis and hopefully the prevention of frozen shoulder.  Heat (pad or rice pillow in microwave) over affected area, 10-15 minutes twice daily.   Ice/cold pack over area for 10-15 min twice daily.  OK to take Tylenol  1000 mg (2 extra strength tabs) or 975 mg (3 regular strength tabs) every 6 hours as needed.  Let us  know if you need anything.  EXERCISES  RANGE OF MOTION (ROM) AND STRETCHING EXERCISES These exercises may help you when beginning to rehabilitate your injury. While completing these exercises, remember:  Restoring tissue flexibility helps normal motion to return to the joints. This allows healthier, less painful movement and activity. An effective stretch should be held for at least 30 seconds. A stretch should never be painful. You should only feel a gentle lengthening or release in the stretched tissue.  ROM - Pendulum Bend at the waist so that your right / left arm falls away from your body. Support yourself with your opposite hand on a solid surface, such as a table or a countertop. Your right / left arm should be perpendicular to the ground. If it is not perpendicular, you need to lean over farther. Relax the muscles in your right / left arm and shoulder as much as possible. Gently sway your hips and trunk so they move your right / left arm without any use of your right / left shoulder muscles. Progress your movements so that your right / left arm moves side to side, then forward and backward, and finally, both clockwise and counterclockwise. Complete 10-15 repetitions in each direction. Many people use this exercise to relieve discomfort in their shoulder as well as to gain range of motion. Repeat 2 times. Complete this exercise 3 times per week.  STRETCH - Flexion, Standing Stand with good posture. With an underhand grip on your right / left hand and an overhand grip on the opposite hand, grasp a broomstick or cane so that your hands are a little  more than shoulder-width apart. Keeping your right / left elbow straight and shoulder muscles relaxed, push the stick with your opposite hand to raise your right / left arm in front of your body and then overhead. Raise your arm until you feel a stretch in your right / left shoulder, but before you have increased shoulder pain. Try to avoid shrugging your right / left shoulder as your arm rises by keeping your shoulder blade tucked down and toward your mid-back spine. Hold 30 seconds. Slowly return to the starting position. Repeat 2 times. Complete this exercise 3 times per week.  STRETCH - Internal Rotation Place your right / left hand behind your back, palm-up. Throw a towel or belt over your opposite shoulder. Grasp the towel/belt with your right / left hand. While keeping an upright posture, gently pull up on the towel/belt until you feel a stretch in the front of your right / left shoulder. Avoid shrugging your right / left shoulder as your arm rises by keeping your shoulder blade tucked down and toward your mid-back spine. Hold 30. Release the stretch by lowering your opposite hand. Repeat 2 times. Complete this exercise 3 times per week.  STRETCH - External Rotation and Abduction Stagger your stance through a doorframe. It does not matter which foot is forward. As instructed by your physician, physical therapist or athletic trainer, place your hands: And forearms above your head and on the door frame. And forearms at head-height and on the door frame. At  elbow-height and on the door frame. Keeping your head and chest upright and your stomach muscles tight to prevent over-extending your low-back, slowly shift your weight onto your front foot until you feel a stretch across your chest and/or in the front of your shoulders. Hold 30 seconds. Shift your weight to your back foot to release the stretch. Repeat 2 times. Complete this stretch 3 times per week.   STRENGTHENING EXERCISES  These  exercises may help you when beginning to rehabilitate your injury. They may resolve your symptoms with or without further involvement from your physician, physical therapist or athletic trainer. While completing these exercises, remember:  Muscles can gain both the endurance and the strength needed for everyday activities through controlled exercises. Complete these exercises as instructed by your physician, physical therapist or athletic trainer. Progress the resistance and repetitions only as guided. You may experience muscle soreness or fatigue, but the pain or discomfort you are trying to eliminate should never worsen during these exercises. If this pain does worsen, stop and make certain you are following the directions exactly. If the pain is still present after adjustments, discontinue the exercise until you can discuss the trouble with your clinician. If advised by your physician, during your recovery, avoid activity or exercises which involve actions that place your right / left hand or elbow above your head or behind your back or head. These positions stress the tissues which are trying to heal.  STRENGTH - Scapular Depression and Adduction With good posture, sit on a firm chair. Supported your arms in front of you with pillows, arm rests or a table top. Have your elbows in line with the sides of your body. Gently draw your shoulder blades down and toward your mid-back spine. Gradually increase the tension without tensing the muscles along the top of your shoulders and the back of your neck. Hold for 3 seconds. Slowly release the tension and relax your muscles completely before completing the next repetition. After you have practiced this exercise, remove the arm support and complete it in standing as well as sitting. Repeat 2 times. Complete this exercise 3 times per week.   STRENGTH - External Rotators Secure a rubber exercise band/tubing to a fixed object so that it is at the same height  as your right / left elbow when you are standing or sitting on a firm surface. Stand or sit so that the secured exercise band/tubing is at your side that is not injured. Bend your elbow 90 degrees. Place a folded towel or small pillow under your right / left arm so that your elbow is a few inches away from your side. Keeping the tension on the exercise band/tubing, pull it away from your body, as if pivoting on your elbow. Be sure to keep your body steady so that the movement is only coming from your shoulder rotating. Hold 3 seconds. Release the tension in a controlled manner as you return to the starting position. Repeat 2 times. Complete this exercise 3 times per week.   STRENGTH - Supraspinatus Stand or sit with good posture. Grasp a 2-3 lb weight or an exercise band/tubing so that your hand is thumbs-up, like when you shake hands. Slowly lift your right / left hand from your thigh into the air, traveling about 30 degrees from straight out at your side. Lift your hand to shoulder height or as far as you can without increasing any shoulder pain. Initially, many people do not lift their hands above shoulder height.  Avoid shrugging your right / left shoulder as your arm rises by keeping your shoulder blade tucked down and toward your mid-back spine. Hold for 3 seconds. Control the descent of your hand as you slowly return to your starting position. Repeat 2 times. Complete this exercise 3 times per week.   STRENGTH - Shoulder Extensors Secure a rubber exercise band/tubing so that it is at the height of your shoulders when you are either standing or sitting on a firm arm-less chair. With a thumbs-up grip, grasp an end of the band/tubing in each hand. Straighten your elbows and lift your hands straight in front of you at shoulder height. Step back away from the secured end of band/tubing until it becomes tense. Squeezing your shoulder blades together, pull your hands down to the sides of your  thighs. Do not allow your hands to go behind you. Hold for 3 seconds. Slowly ease the tension on the band/tubing as you reverse the directions and return to the starting position. Repeat 2 times. Complete this exercise 3 times per week.   STRENGTH - Scapular Retractors Secure a rubber exercise band/tubing so that it is at the height of your shoulders when you are either standing or sitting on a firm arm-less chair. With a palm-down grip, grasp an end of the band/tubing in each hand. Straighten your elbows and lift your hands straight in front of you at shoulder height. Step back away from the secured end of band/tubing until it becomes tense. Squeezing your shoulder blades together, draw your elbows back as you bend them. Keep your upper arm lifted away from your body throughout the exercise. Hold 3 seconds. Slowly ease the tension on the band/tubing as you reverse the directions and return to the starting position. Repeat 2 times. Complete this exercise 3 times per week.  STRENGTH - Scapular Depressors Find a sturdy chair without wheels, such as a from a dining room table. Keeping your feet on the floor, lift your bottom from the seat and lock your elbows. Keeping your elbows straight, allow gravity to pull your body weight down. Your shoulders will rise toward your ears. Raise your body against gravity by drawing your shoulder blades down your back, shortening the distance between your shoulders and ears. Although your feet should always maintain contact with the floor, your feet should progressively support less body weight as you get stronger. Hold 3 seconds. In a controlled and slow manner, lower your body weight to begin the next repetition. Repeat 2 times. Complete this exercise 3 times per week.    This information is not intended to replace advice given to you by your health care provider. Make sure you discuss any questions you have with your health care provider.   Document Released:  07/07/2005 Document Revised: 09/13/2014 Document Reviewed: 12/05/2008 Elsevier Interactive Patient Education [employer]

## 2024-05-22 ENCOUNTER — Ambulatory Visit: Payer: Medicare (Managed Care) | Admitting: Acute Care

## 2024-05-24 ENCOUNTER — Ambulatory Visit: Payer: Medicare (Managed Care) | Admitting: Behavioral Health

## 2024-05-24 ENCOUNTER — Encounter: Payer: Self-pay | Admitting: Behavioral Health

## 2024-05-24 DIAGNOSIS — F411 Generalized anxiety disorder: Secondary | ICD-10-CM | POA: Diagnosis not present

## 2024-05-24 DIAGNOSIS — F331 Major depressive disorder, recurrent, moderate: Secondary | ICD-10-CM

## 2024-05-24 NOTE — Progress Notes (Signed)
 [facility] Counselor/Therapist Progress Note  Patient ID: Paula Stout, MRN: [id number],    Date: 05/24/2024 Time Spent:3:00 until 3:56 pm, 56 minutes. The patient consented to the video teletherapy and was located in her home during this session. She is aware it is the responsibility of the patient to secure confidentiality on her end of the session. The provider was in a private home  for the duration of this session.      Treatment Type: Individual Therapy  Reported Symptoms: Anxiety, depression  Mental Status Exam: Appearance:  Casual     Behavior: Appropriate  Motor: Normal  Speech/Language:  Normal Rate  Affect: Appropriate  Mood: normal  Thought process: normal  Thought content:   WNL  Sensory/Perceptual disturbances:   WNL  Orientation: oriented to person, place, time/date, situation, day of week, month of year, and year  Attention: Good  Concentration: Good  Memory: WNL  Fund of knowledge:  Good  Insight:   Good  Judgment:  Good  Impulse Control: Good   Risk Assessment: Danger to Self:  No Self-injurious Behavior: No Danger to Others: No Duty to Warn:no Physical Aggression / Violence:No  Access to Firearms a concern: No  Gang Involvement:No   Subjective: The patient is clavicle is healing which she is thankful for but she got an injection and said for 2 or 3 days because that hit the muscle she felt worse.  She had to take some muscle relaxers.  She also has had several migraines and heightened anxiety primarily related to trying to prepare for the move.  Still limited in what she can do to prepare.  She is trying to pack small totes as much as she can.  Still is not getting any help with those toes from her daughter or son-in-law.  She also was waiting on the title which her brother in [place]  is supposed to pick up and sent to her and did not do that last week as promised.  He has promised again to go Monday or Tuesday of next week so she hopes  to have a by the end of next week.  She is resting is much as she can trying to compartmentalize anxious thoughts but also do as much as she can physically and trying to pack up her belongings.  She is using her coping skills and we talked about the importance of using those sensory coping skills.  She does contract for safety having no thoughts of hurting herself or anyone else. Diagnosis: Generalized anxiety disorder, major depressive disorder, moderate, recurrent Plan: I will meet with the patient weekly via care agility.  Treatment plan: We will use cognitive behavioral therapy as well as person centered and supportive therapy in addition to elements of dialectical behavior therapy to help reduce the patient's anxiety and depression by at least 50% with a target date of October 06, 2023.  Goals for improving depression may include having less sadness as indicated by patient report and scores on the PHQ-9, have improved mood and return to a healthier level of functioning, identify causes including environment for depressed mood and learn ways to cope with depression especially those connected to her medical issues.  Interventions include using cognitive behavioral therapy to explore and replace thoughts and behaviors.  We will look at how depression is experienced in day-to-day living and encouraged sharing of feelings.  We will encourage the use of coping skills for management of depressive symptoms.  Goals for reducing anxiety are to improve her ability  to manage anxiety symptoms, better handle stress, identify causes for anxiety and explore ways to lower it, resolve the core conflicts contributing to anxiety as well as manage thoughts and worrisome thinking contributing to feelings of anxiety.  Interventions will include providing education about anxiety, facilitate problem solution skills to help her identify options for resolving stress, teach coping skills for managing anxiety as well as mindfulness  and communication in terms of family to reduce her stress level, use cognitive behavior therapy to identify and change anxiety provoking thought and behavior patterns as well as teach distress tolerance and mindfulness skills for alleviating anxiety. Progress: 40% progress notes were reviewed with a new extended target date of  January 31st, 2026.  Lorrene CHRISTELLA Hasten, [facility]                                         Lorrene CHRISTELLA Hasten, [facility]               Lorrene CHRISTELLA Hasten, [facility]               Lorrene CHRISTELLA Hasten, [facility]               Lorrene CHRISTELLA Hasten, [facility]               Lorrene CHRISTELLA Hasten, [facility]               Lorrene CHRISTELLA Hasten, [facility]               Lorrene CHRISTELLA Hasten, [facility]               Lorrene CHRISTELLA Hasten, [facility]               Lorrene CHRISTELLA Hasten, [facility]               Lorrene CHRISTELLA Hasten, [facility]               Lorrene CHRISTELLA Hasten, [facility]               Lorrene CHRISTELLA Hasten, [facility]               Lorrene CHRISTELLA Hasten, [facility]               Lorrene CHRISTELLA Hasten, [facility]

## 2024-05-25 ENCOUNTER — Other Ambulatory Visit: Payer: Self-pay | Admitting: Neurology

## 2024-05-25 ENCOUNTER — Encounter: Payer: Self-pay | Admitting: Neurology

## 2024-05-25 ENCOUNTER — Ambulatory Visit: Payer: Medicare (Managed Care) | Admitting: Neurology

## 2024-05-25 DIAGNOSIS — G43709 Chronic migraine without aura, not intractable, without status migrainosus: Secondary | ICD-10-CM

## 2024-05-25 MED ORDER — ONABOTULINUMTOXINA 100 UNITS IJ SOLR
200.0000 [IU] | Freq: Once | INTRAMUSCULAR | Status: AC
  Administered 2024-05-25: 155 [IU] via INTRAMUSCULAR

## 2024-05-25 MED ORDER — NURTEC 75 MG PO TBDP: 75.0000 mg | ORAL_TABLET | ORAL | 5 refills | Status: DC | PRN

## 2024-05-25 NOTE — Progress Notes (Signed)

## 2024-05-25 NOTE — Telephone Encounter (Signed)
 I sent a refill for Nurtec to [facility].  I wasn't the provider prescribing tramadol 

## 2024-05-28 ENCOUNTER — Encounter: Payer: Self-pay | Admitting: Acute Care

## 2024-05-28 ENCOUNTER — Ambulatory Visit: Payer: Medicare (Managed Care) | Admitting: Acute Care

## 2024-05-28 ENCOUNTER — Telehealth: Payer: Medicare (Managed Care) | Admitting: Acute Care

## 2024-05-28 VITALS — Ht 66.0 in | Wt 160.0 lb

## 2024-05-28 DIAGNOSIS — R911 Solitary pulmonary nodule: Secondary | ICD-10-CM | POA: Diagnosis not present

## 2024-05-28 DIAGNOSIS — R9389 Abnormal findings on diagnostic imaging of other specified body structures: Secondary | ICD-10-CM | POA: Diagnosis not present

## 2024-05-28 NOTE — Progress Notes (Signed)
 Virtual Visit via Video Note  I connected with Paula Stout on 05/28/24 at 11:30 AM EDT by a video enabled telemedicine application and verified that I am speaking with the correct person using two identifiers.  Location: Patient:  At home Provider: [address] [place], [place], [address]    I discussed the limitations of evaluation and management by telemedicine and the availability of in person appointments. The patient expressed understanding and agreed to proceed.  History of Present Illness: Paula Stout is a 68 y.o. female  never smoker with a history of coronary disease, chronic atrial fibrillation, migraines, asthma, GERD and rhinitis, diabetes, CKD.  Dr. Shelah saw her for an abnormal CT scan of the chest in October 2024 that showed a focal nodular density with irregular borders in the superior segment of the right lower lobe 1.3 x 1.6 cm.  It was slightly less prominent on repeat CT done 07/27/2023 and it was  decided plan was to follow with serial imaging.   CT scan of the chest 10/17/2023 reviewed by me shows overall stable appearance of the irregular superior segmental right lower lobe nodule.  Looks quite similar to the scan done in September 2024   Pulmonary function testing 10/20/2023 was done and  showed grossly normal airflows without a bronchodilator response.  Normal lung volumes decreased diffusion capacity that corrects to the normal range when adjusted for alveolar volume   Because the right upper lobe nodule was concerning in appearance for  a possible malignancy, Dr. Shelah felt the best plan was for bronchoscopy with biopsies , then referral for surgery if malignancy is present. Pt. Underwent bronchoscopy with biopsies 11/08/2023. Cytology was negative for malignancy. We have been following these nodules every 3 months for stability. Last scan shows stable scarring. I spoke with Dr. Erla about the scan and she felt what she is seeing reflects scarring from an  infectious or inflammatory etiology. She recommends a 12 month follow up scan. Pt. Denies any weight loss ot hemoptysis.   Pt. States she is moving out of state to [place] , [place]  mid October 2025. I explained she needs to get a pulmonologist to follow her there. I also advised she get her scans placed onto CD's so they have the actual images to compare follow up to scans moving forward. She verbalized understanding. She denies any hemoptysis or unexplained weight loss.   She did have a biopsy of the right thyroid  07/07/2023 at [facility]. Per the patient this was negative, and I will be followed by [facility].  She had a renal US  kidney 09/21/2022 by Dr. Prescilla  which showed a stable cyst, with no follow up recommended.      Observations/Objective: CT Chest 05/01/2024 Stable 1.4 x 0.9 cm architectural distortion with 7 mm solid component, the more medial of the 2 adjacent areas of architectural distortion in the superior segment of the right lower lobe. No change since the last CT but nevertheless solid component than on earlier CTs. The more lateral of these is 1.2 x 0.7 cm and does not have a discrete solid component. No new or further nodules. Aortic and coronary artery atherosclerosis. Small chronic pericardial effusion. Nonobstructive nephrolithiasis. 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 Both were previously recommended to be biopsied. Unless biopsy was done elsewhere, the biopsy recommendation is reiterated herein.  CXR 04/24/2024 The heart size and mediastinal contours are within normal limits. Both lungs are clear. The visualized skeletal structures are unremarkable. No active cardiopulmonary disease.    Cytology 11/08/2023 A. LUNG, RLL, FINE NEEDLE ASPIRATION  BIOPSY:  - No malignant cells identified  -  Benign bronchial cells and macrophages  - Abundant acute inflammation   B. LUNG, RLL, BRUSHING:  - No malignant cells identified  - Few benign bronchial cells and macrophages  - Predominantly blood    BAL no growth Fungal negative AFB Negative  Assessment and Plan: Abnormal chest imaging Post bronchoscopy with biopsies 11/08/2023 Biopsies negative for malignancy Right lower lobe brushings and needle biopsies show abundant acute inflammation, bronchial cells and macrophages  Follow up imaging shows scarring most likely related to infection inflammation Plan Your scan shows stable lung nodules that look like scarring from infection or inflammation . This is good news. Recommend an annual Ct chest to have these re-evaluated for stability, due 04/2025. As you are moving to [place], please make sure you establish with a Pulmonologist to continue to follow these nodules.  I recommend that you get the previous scans on CD, so future physicians have images for comparison.  This can be done through  the Medical Records Department.  You will need to complete paperwork to release these films to your new provider.  Good Luck in [place].  Please contact office for sooner follow up if symptoms do not improve or worsen or seek emergency care .   Follow Up Instructions: Moving out of state in 1 month. Please make sure you establish with a Pulmonologist to continue to follow these nodules.  I recommend that you get the previous scans on CD, so future physicians have images for comparison.  This can be done through  the Medical Records Department.  You will need to complete paperwork to release these films to your new provider.  Good Luck in [place].   I spent 15 minutes dedicated to the care of this patient on the date of this encounter to include pre-visit review of records, video face-to-face time with the patient discussing conditions above, post visit ordering of  testing, clinical documentation with the electronic health record, making appropriate referrals as documented, and communicating necessary information to the patient's healthcare team.    I discussed the assessment and treatment plan with the patient. The patient was provided an opportunity to ask questions and all were answered. The patient agreed with the plan and demonstrated an understanding of the instructions.   The patient was advised to call back or seek an in-person evaluation if the symptoms worsen or if the condition fails to improve as anticipated.   Lauraine JULIANNA Lites, NP

## 2024-05-28 NOTE — Progress Notes (Deleted)
 History of Present Illness Paula Stout is a 68 y.o. female with ***   05/28/2024 Discussed the use of AI scribe software for clinical note transcription with the patient, who gave verbal consent to proceed.  History of Present Illness      Test Results: CT Chest 05/01/2024 read 9/9/ IMPRESSION: 1. Stable 1.4 x 0.9 cm architectural distortion with 7 mm solid component, the more medial of the 2 adjacent areas of architectural distortion in the superior segment of the right lower lobe. No change since the last CT but nevertheless solid component smaller than on earlier CTs. 2. The more lateral of these is 1.2 x 0.7 cm and does not have a discrete solid component. 3. No new or further nodules. 4. Aortic and coronary artery atherosclerosis. 5. Small chronic pericardial effusion. 6. Nonobstructive nephrolithiasis. 7. 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 8. 2.8 cm solid right thyroid  nodule extending into the superior mediastinum, and 1.5 cm right upper pole solid nodule on prior ultrasound not well seen on CT. Both were previously recommended to be biopsied. Unless biopsy was done elsewhere, the biopsy recommendation is reiterated herein.    Latest Ref Rng & Units 05/10/2024    9:15 PM 05/01/2024    2:33 PM 03/05/2024    2:41 PM  CBC  WBC 4.0 - 10.5 K/uL 7.5  7.1  7.4   Hemoglobin 12.0 - 15.0 g/dL 87.8  88.5  87.8   Hematocrit 36.0 - 46.0 % 36.9  34.8  38.0   Platelets 150 - 400 K/uL 262  286.0  341.0        Latest Ref Rng & Units 05/10/2024    9:15 PM 05/01/2024    2:33 PM 03/05/2024    2:41 PM  BMP  Glucose 70 - 99 mg/dL 96  88  93   BUN 8 - 23 mg/dL 19  21  16    Creatinine 0.44 - 1.00 mg/dL 8.15  8.20  8.16   Sodium 135 - 145 mmol/L 141  144  140   Potassium 3.5 - 5.1 mmol/L 3.6  4.5  4.0   Chloride 98 - 111 mmol/L 110  111  108   CO2 22 - 32 mmol/L 20  24  21    Calcium  8.9 - 10.3 mg/dL 9.5  9.3  9.7     BNP    Component Value Date/Time   BNP 62.1  05/15/2023 1952    ProBNP No results found for: PROBNP  PFT    Component Value Date/Time   FEV1PRE 2.30 10/20/2023 1316   FEV1POST 2.36 10/20/2023 1316   FVCPRE 3.12 10/20/2023 1316   FVCPOST 3.28 10/20/2023 1316   TLC 5.60 10/20/2023 1316   DLCOUNC 16.49 10/20/2023 1316   PREFEV1FVCRT 74 10/20/2023 1316   PSTFEV1FVCRT 72 10/20/2023 1316    XR Clavicle Right Result Date: 05/15/2024 X-rays of the right clavicle show arthritic changes of the [facility] joint.  No acute abnormalities.  Healed distal clavicle fracture.  CT CHEST WO CONTRAST Result Date: 05/13/2024 CLINICAL DATA:  Right lower lobe lesion with low-level PET activity last February, subsequent bronchial needle aspiration biopsy 11/08/2023 and bronchial brushings and washings failed to show malignant cells on pathology. Presents for imaging follow-up. EXAM: CT CHEST WITHOUT CONTRAST TECHNIQUE: Multidetector CT imaging of the chest was performed following the standard protocol without IV contrast. RADIATION DOSE REDUCTION: This exam was performed according to the departmental dose-optimization program which includes automated exposure control, adjustment of the mA and/or kV  according to patient size and/or use of iterative reconstruction technique. COMPARISON:  Last chest x-ray was PA and lateral 02/27/2024, comparison is made with chest CTs without contrast 02/08/2024 and 01/25/2024, PET-CT 10/28/2023. FINDINGS: Cardiovascular: The heart is slightly enlarged. There is a small chronic pericardial effusion. There is trace left main and proximal LAD coronary artery calcification with no other visible coronary plaques. Mild aortic atherosclerosis is seen without aneurysm. The unenhanced great vessels are unremarkable. Pulmonary arteries and veins are normal caliber. Mediastinum/Nodes: 2.8 cm solid thyroid  nodule again extends into the superior mediastinum from the lower pole of the right lobe. Prior ultrasound also demonstrates a 1.5 cm upper pole  solid nodule which is not well seen due to streak artifacts through the area. The larger lesion seems unchanged but both have been previously recommended for biopsy per the report of a thyroid  ultrasound 05/24/2023. Unless biopsy was performed elsewhere, the biopsy recommendation is reiterated herein. No intrathoracic or axillary adenopathy are seen. Negative esophagus, thoracic trachea. Lungs/Pleura: In the superior segment of the right lower lobe, again noted are 2 adjacent areas of architectural distortion, withdrawn 01/25/2024 and 10/28/2023 had a more solid, connected appearance. The more lateral of these is 1.2 x 0.7 cm on 302:54 and does not have a discrete solid component. Medial to this, the other architectural distortion is 1.4 x 0.9 cm on this same image and has a solid component measuring 7 mm, considerably smaller than on 01/25/2024, stable over the last CT. Rest of the lungs are generally clear with no new abnormality, no further nodules. There is a calcified granuloma in the left upper lobe. No active infiltrate or effusion. Upper Abdomen: 3 mm nonobstructive caliceal stone upper pole left kidney and punctate nonobstructive right upper pole micronephrolithiasis. 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 Mild elevation right hemidiaphragm with no acute upper abdominal finding. Musculoskeletal: No chest wall mass or suspicious bone lesions identified. IMPRESSION: 1. Stable 1.4 x 0.9 cm architectural distortion with 7 mm solid component, the more medial of the 2 adjacent areas of architectural distortion in the superior segment of the right lower lobe. No change since the last CT but nevertheless solid component than on earlier CTs. 2. The more lateral of these is 1.2 x 0.7 cm and does not have a discrete solid component. 3. No new or further nodules. 4. Aortic and coronary artery atherosclerosis. 5. Small chronic pericardial effusion. 6. Nonobstructive  nephrolithiasis. 7. 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 8. 2.8 cm solid right thyroid  nodule extending into the superior mediastinum, and 1.5 cm right upper pole solid nodule on prior ultrasound not well seen on CT. Both were previously recommended to be biopsied. Unless biopsy was done elsewhere, the biopsy recommendation is reiterated herein. Aortic Atherosclerosis (ICD10-I70.0). Electronically Signed   By: Francis Quam M.D.   On: 05/13/2024 00:21   DG Chest 2 View Result Date: 05/10/2024 CLINICAL DATA:  Chest pain. EXAM: CHEST - 2 VIEW COMPARISON:  02/27/2024, CT 05/01/2024 FINDINGS: The cardiomediastinal contours are normal. The lungs are clear. Pulmonary vasculature is normal. No consolidation, pleural effusion, or pneumothorax. No acute osseous abnormalities are seen. IMPRESSION: No active cardiopulmonary disease. Electronically Signed   By: Andrea Gasman M.D.   On: 05/10/2024 21:51     Past medical hx Past Medical History:  Diagnosis Date   Adhesive capsulitis of left shoulder 12/16/2016   Asthma without status asthmaticus 02/06/2021   At risk for falls 02/04/2019  Atrial fibrillation (HCC) 09/01/2014   Last Assessment & Plan:  Formatting of this note might be different from the original. A:  Chronic.  Sinus rhythm at this time.  States she was told this during admission at [facility] in the past. P:  On baby aspirin  at home will continue.  Low dose metoprolol  started.   Bipolar 1 disorder, depressed, moderate (HCC) 02/18/2020   Capsulitis of left shoulder 10/20/2021   Chronic anticoagulation 02/20/2020   Chronic atrial fibrillation (HCC) 02/18/2020   Chronic headache 10/11/2013   Chronic interstitial cystitis 12/28/2005   Chronic migraine without aura, intractable, without status migrainosus 06/24/2017   Colon cancer screening 04/15/2016   Last Assessment & Plan:  Formatting of this note might be different from the original. Will schedule for colonoscopy    Depression, recurrent (HCC) 02/18/2020   Diabetes mellitus (HCC)    Dyslipidemia 02/20/2020   GAD (generalized anxiety disorder) 02/18/2020   GERD (gastroesophageal reflux disease) 11/05/2021   H/O chest pain 07/31/2021   History of atrial fibrillation 06/24/2017   History of posttraumatic stress disorder (PTSD) 10/07/2016   Hyperosmolarity due to secondary diabetes mellitus (HCC) 02/06/2021   Impaired mobility and activities of daily living 11/14/2015   Irritable bowel syndrome (IBS)    Junctional tachycardia (HCC) 11/05/2021   Kidney stone 02/06/2021   Migraine without aura, not refractory 02/06/2021   Mixed hyperlipidemia 04/06/2021   Last Assessment & Plan:  Formatting of this note might be different from the original. 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 Controlled   Refractory migraine with aura 02/06/2021   Refractory migraine without aura 02/06/2021   Renal colic on left side 04/22/2017   Stage 3 chronic kidney disease (HCC) 02/20/2020   Thyroid  goiter    10/17/23 CT: right thyorid goiter with substernal extension, benign FNA 07/07/23   Unspecified adverse effect of other drug, medicinal and biological substance(995.29) 02/17/2006   Urinary tract obstruction 02/06/2021     Social History   Tobacco Use   Smoking status: Never    Passive exposure: Past   Smokeless tobacco: Never   Tobacco comments:    Never smoke 07/15/22  Vaping Use   Vaping status: Never Used  Substance Use Topics   Alcohol use: Never    Comment: light social   Drug use: Never    Ms.Norton reports that she has never smoked. She has been exposed to tobacco smoke. She has  never used smokeless tobacco. She reports that she does not drink alcohol and does not use drugs.  Tobacco Cessation: Counseling given: Not Answered Tobacco comments: Never smoke 07/15/22   Past surgical hx, Family hx, Social hx all reviewed.  Current Outpatient Medications on File Prior to Visit  Medication Sig   acetaminophen  (TYLENOL ) 500 MG tablet Take 1,000 mg by mouth every 6 (six) hours as needed for mild pain.   apixaban  (ELIQUIS ) 5 MG TABS tablet Take 1 tablet (5 mg total) by mouth 2 (two) times daily.   atorvastatin  (LIPITOR ) 10 MG tablet Take 1 tablet (10 mg total) by mouth daily.   Baclofen  5 MG TABS TAKE ONE TABLET BY MOUTH TWICE A DAY AS NEEDED   blood glucose meter kit  and supplies One Touch Ultra  Check blood sugars once daily Dx: E11.9   botulinum toxin Type A  (BOTOX ) 200 units injection Inject 155 units IM into multiple site in the face,neck and head once every 90 days   budesonide -formoterol  (SYMBICORT ) 80-4.5 MCG/ACT inhaler Inhale 2 puffs into the lungs 2 (two) times daily.   buPROPion  (WELLBUTRIN  XL) 150 MG 24 hr tablet Take 1 tablet (150 mg total) by mouth daily.   Cholecalciferol (D 5000) 125 MCG (5000 UT) capsule Take 5,000 Units by mouth daily.   Continuous Glucose Sensor (DEXCOM G7 SENSOR) MISC APPLY ONE SENSOR TO THE BACK OF YOUR UPPER ARM. REPLACE EVERY 10 DAYS AS DIRECTED   Cyanocobalamin  5000 MCG TBDP Take 5,000 mcg by mouth.   EPINEPHRINE  0.3 mg/0.3 mL IJ SOAJ injection INJECT ONE PEN INTO THE THIGH OR AS DIRECTED. AFTER ADMINISTRATION CALL 911. IF ANAPHYLACTIC SYMPTOMS PERSIST AFTER FIRST DOSE, MAY REPEAT DOSE IN 5 TO 15 MINUTES.   escitalopram  (LEXAPRO ) 20 MG tablet Take 1 tablet (20 mg total) by mouth daily.   ezetimibe  (ZETIA ) 10 MG tablet TAKE 1 TABLET DAILY   fluticasone  (FLONASE ) 50 MCG/ACT nasal spray Place 2 sprays into both nostrils daily.   Fremanezumab -vfrm (AJOVY ) 225 MG/1.5ML SOAJ INJECT 225 MG UNDER THE SKIN EVERY 30 DAYS   levocetirizine  (XYZAL ) 5 MG tablet Take 1 tablet (5 mg total) by mouth every evening.   LORazepam  (ATIVAN ) 0.5 MG tablet TAKE ONE TABLET BY MOUTH EVERY 8 HOURS   Lurasidone  HCl 120 MG TABS Take 1 tablet (120 mg total) by mouth at bedtime.   magnesium  oxide (MAG-OX) 400 (240 Mg) MG tablet Take 400 mg by mouth daily.   midodrine  (PROAMATINE ) 10 MG tablet Take 1 tablet (10 mg total) by mouth 3 (three) times daily.   Multiple Vitamins-Minerals (MULTIVITAMIN WITH MINERALS) tablet Take 1 tablet by mouth daily. Daily Diabetes Health Pack - MVI + chromium, fish oil + vit D, magnesium , vit C, alpha lipoic acid w/ green tea   nitroGLYCERIN  (NITROSTAT ) 0.4 MG SL tablet Place 1 tablet (0.4 mg total) under the tongue every 5 (five) minutes as needed for chest pain.   ondansetron  (ZOFRAN ) 4 MG tablet Take 1 tablet (4 mg total) by mouth every 6 (six) hours.   ONETOUCH VERIO test strip USE DAILY TO CHECK BLOOD SUGAR   pantoprazole  (PROTONIX ) 40 MG tablet TAKE 1 TABLET DAILY   QUEtiapine  (SEROQUEL ) 300 MG tablet Take 1 tablet (300 mg total) by mouth at bedtime.   Rimegepant Sulfate  (NURTEC) 75 MG TBDP Take 1 tablet (75 mg total) by mouth as needed (take 1 tab at the earlist onset of a migraine. Max 1 tab in 24 hours).   tirzepatide  (MOUNJARO ) 7.5 MG/0.5ML Pen Inject 7.5 mg into the skin once a week.   topiramate  (TOPAMAX ) 50 MG tablet TAKE 3 TABLETS TWICE A DAY   No current facility-administered medications on file prior to visit.     Allergies  Allergen Reactions   Bee Venom Anaphylaxis   Onion Other (See Comments)    Respiratory Distress   Tetanus-Diphtheria Toxoids Td Anaphylaxis   Sumatriptan      Passed out, nose bleed    Zolpidem  Other (See Comments)    Causes sleep walking   Asa [Aspirin ] Nausea And Vomiting   Iodine Rash   Nsaids Nausea Only    Rash, hives and trouble breathing   Sulfa Antibiotics Rash   Vancomycin Rash    Other Reaction(s): STEVENS-JOHNSON    Review Of  Systems:  Constitutional:   No   weight loss, night sweats,  Fevers, chills, fatigue, or  lassitude.  HEENT:   No headaches,  Difficulty swallowing,  Tooth/dental problems, or  Sore throat,                No sneezing, itching, ear ache, nasal congestion, post nasal drip,   CV:  No chest pain,  Orthopnea, PND, swelling in lower extremities, anasarca, dizziness, palpitations, syncope.   GI  No heartburn, indigestion, abdominal pain, nausea, vomiting, diarrhea, change in bowel habits, loss of appetite, bloody stools.   Resp: No shortness of breath with exertion or at rest.  No excess mucus, no productive cough,  No non-productive cough,  No coughing up of blood.  No change in color of mucus.  No wheezing.  No chest wall deformity  Skin: no rash or lesions.  GU: no dysuria, change in color of urine, no urgency or frequency.  No flank pain, no hematuria   MS:  No joint pain or swelling.  No decreased range of motion.  No back pain.  Psych:  No change in mood or affect. No depression or anxiety.  No memory loss.   Vital Signs Ht 5' 6 (1.676 m)   Wt 160 lb (72.6 kg)   BMI 25.82 kg/m    Physical Exam:  General- No distress,  A&Ox3 ENT: No sinus tenderness, TM clear, pale nasal mucosa, no oral exudate,no post nasal drip, no LAN Cardiac: S1, S2, regular rate and rhythm, no murmur Chest: No wheeze/ rales/ dullness; no accessory muscle use, no nasal flaring, no sternal retractions Abd.: Soft Non-tender Ext: No clubbing cyanosis, edema Neuro:  normal strength Skin: No rashes, warm and dry Psych: normal mood and behavior   Assessment/Plan  Assessment and Plan Assessment & Plan        Lauraine JULIANNA Lites, NP 05/28/2024  11:37 AM

## 2024-05-28 NOTE — Patient Instructions (Addendum)
 It was good to see you today. Your scan shows stable lung nodules that look like scarring from infection or inflammation . This is good news. Recommend an annual Ct chest to have these re-evaluated for stability, due 04/2025. As you are moving to [place], please make sure you establish with a Pulmonologist to continue to follow these nodules.  I recommend that you get the previous scans on CD, so future physicians have images for comparison.  This can be done through  the Medical Records Department.  You will need to complete paperwork to release these films to your new provider.  Good Luck in [place].  Please contact office for sooner follow up if symptoms do not improve or worsen or seek emergency care .

## 2024-05-30 ENCOUNTER — Ambulatory Visit: Payer: Medicare (Managed Care) | Admitting: Family Medicine

## 2024-05-31 ENCOUNTER — Ambulatory Visit (INDEPENDENT_AMBULATORY_CARE_PROVIDER_SITE_OTHER): Payer: Medicare (Managed Care) | Admitting: Behavioral Health

## 2024-05-31 ENCOUNTER — Encounter: Payer: Self-pay | Admitting: Behavioral Health

## 2024-05-31 DIAGNOSIS — F331 Major depressive disorder, recurrent, moderate: Secondary | ICD-10-CM | POA: Diagnosis not present

## 2024-05-31 DIAGNOSIS — F411 Generalized anxiety disorder: Secondary | ICD-10-CM | POA: Diagnosis not present

## 2024-05-31 NOTE — Progress Notes (Signed)
 [facility] Counselor/Therapist Progress Note  Patient ID: Paula Stout, MRN: [id number],    Date: 05/31/2024 Time Spent:3:02 until 3:54 pm, 52 minutes. The patient consented to the video teletherapy and was located in her home during this session. She is aware it is the responsibility of the patient to secure confidentiality on her end of the session. The provider was in a private home  for the duration of this session.      Treatment Type: Individual Therapy  Reported Symptoms: Anxiety, depression  Mental Status Exam: Appearance:  Casual     Behavior: Appropriate  Motor: Normal  Speech/Language:  Normal Rate  Affect: Appropriate  Mood: normal  Thought process: normal  Thought content:   WNL  Sensory/Perceptual disturbances:   WNL  Orientation: oriented to person, place, time/date, situation, day of week, month of year, and year  Attention: Good  Concentration: Good  Memory: WNL  Fund of knowledge:  Good  Insight:   Good  Judgment:  Good  Impulse Control: Good   Risk Assessment: Danger to Self:  No Self-injurious Behavior: No Danger to Others: No Duty to Warn:no Physical Aggression / Violence:No  Access to Firearms a concern: No  Gang Involvement:No   Subjective: There have been some positives.  Her daughter did help her get the first to be a get up in downstairs and into the garage.  There is still a lot of work to do and she has to get some empty bins upstairs which she feels she can do.  Her brother was not doing what he promised to do and going to the [facility] in [place]  so her friend volunteered to do it but then the [facility] office would not let her do it so she is going to download the paperwork online.  She could have done that to start with but that is a slower process and thought that her brother doing it make like to her next day would work.  She is going to go a lot tonight or tomorrow to see what she can get done.  That has been discouraging to her  because she cannot ship her car until she gets the title.  She acknowledges that it is tough to be motivated to get things done because it feels like there is a lot to do.  Not heard yet that her first and then is ready.  I encouraged compartmentalization of task we talked about some of the few small tasks that she can do over the next few days to give her a boost and confidence.  She understands that it is depression is keeping her from doing things we talked about the importance of doing small things.  We set a plan in place for her to do something small at least every day and if she felt better to add to the list incrementally.  She does contract for safety having no thoughts of hurting herself or anyone else. Diagnosis: Generalized anxiety disorder, major depressive disorder, moderate, recurrent Plan: I will meet with the patient weekly via care agility.  Treatment plan: We will use cognitive behavioral therapy as well as person centered and supportive therapy in addition to elements of dialectical behavior therapy to help reduce the patient's anxiety and depression by at least 50% with a target date of October 06, 2023.  Goals for improving depression may include having less sadness as indicated by patient report and scores on the PHQ-9, have improved mood and return to a healthier level of functioning,  identify causes including environment for depressed mood and learn ways to cope with depression especially those connected to her medical issues.  Interventions include using cognitive behavioral therapy to explore and replace thoughts and behaviors.  We will look at how depression is experienced in day-to-day living and encouraged sharing of feelings.  We will encourage the use of coping skills for management of depressive symptoms.  Goals for reducing anxiety are to improve her ability to manage anxiety symptoms, better handle stress, identify causes for anxiety and explore ways to lower it, resolve the  core conflicts contributing to anxiety as well as manage thoughts and worrisome thinking contributing to feelings of anxiety.  Interventions will include providing education about anxiety, facilitate problem solution skills to help her identify options for resolving stress, teach coping skills for managing anxiety as well as mindfulness and communication in terms of family to reduce her stress level, use cognitive behavior therapy to identify and change anxiety provoking thought and behavior patterns as well as teach distress tolerance and mindfulness skills for alleviating anxiety. Progress: 40% progress notes were reviewed with a new extended target date of  January 31st, 2026.  Lorrene CHRISTELLA Hasten, [facility]                                         Lorrene CHRISTELLA Hasten, [facility]               Lorrene CHRISTELLA Hasten, [facility]               Lorrene CHRISTELLA Hasten, [facility]               Lorrene CHRISTELLA Hasten, [facility]               Lorrene CHRISTELLA Hasten, [facility]               Lorrene CHRISTELLA Hasten, [facility]               Lorrene CHRISTELLA Hasten, [facility]               Lorrene CHRISTELLA Hasten, [facility]               Lorrene CHRISTELLA Hasten, [facility]               Lorrene CHRISTELLA Hasten, [facility]               Lorrene CHRISTELLA Hasten, [facility]               Lorrene CHRISTELLA Hasten, [facility]               Lorrene CHRISTELLA Hasten, [facility]               Lorrene CHRISTELLA Hasten, [facility]               Lorrene CHRISTELLA Hasten, [facility]

## 2024-06-03 ENCOUNTER — Emergency Department (HOSPITAL_BASED_OUTPATIENT_CLINIC_OR_DEPARTMENT_OTHER)
Admission: EM | Admit: 2024-06-03 | Discharge: 2024-06-03 | Disposition: A | Discharge status: Home / Self Care | Payer: Medicare (Managed Care) | Attending: Emergency Medicine | Admitting: Emergency Medicine

## 2024-06-03 ENCOUNTER — Encounter (HOSPITAL_BASED_OUTPATIENT_CLINIC_OR_DEPARTMENT_OTHER): Payer: Self-pay

## 2024-06-03 ENCOUNTER — Other Ambulatory Visit: Payer: Self-pay | Admitting: Family Medicine

## 2024-06-03 ENCOUNTER — Emergency Department (HOSPITAL_BASED_OUTPATIENT_CLINIC_OR_DEPARTMENT_OTHER): Payer: Medicare (Managed Care)

## 2024-06-03 DIAGNOSIS — R0602 Shortness of breath: Secondary | ICD-10-CM | POA: Insufficient documentation

## 2024-06-03 DIAGNOSIS — R0789 Other chest pain: Secondary | ICD-10-CM | POA: Diagnosis not present

## 2024-06-03 DIAGNOSIS — Z7901 Long term (current) use of anticoagulants: Secondary | ICD-10-CM | POA: Insufficient documentation

## 2024-06-03 DIAGNOSIS — I482 Chronic atrial fibrillation, unspecified: Secondary | ICD-10-CM | POA: Insufficient documentation

## 2024-06-03 DIAGNOSIS — G43909 Migraine, unspecified, not intractable, without status migrainosus: Secondary | ICD-10-CM | POA: Diagnosis present

## 2024-06-03 DIAGNOSIS — R002 Palpitations: Secondary | ICD-10-CM | POA: Diagnosis not present

## 2024-06-03 DIAGNOSIS — I480 Paroxysmal atrial fibrillation: Secondary | ICD-10-CM

## 2024-06-03 DIAGNOSIS — G43809 Other migraine, not intractable, without status migrainosus: Secondary | ICD-10-CM | POA: Diagnosis not present

## 2024-06-03 LAB — PROTIME-INR
INR: 1 (ref 0.8–1.2)
Prothrombin Time: 14.1 s (ref 11.4–15.2)

## 2024-06-03 LAB — BASIC METABOLIC PANEL WITH GFR
Anion gap: 11 (ref 5–15)
BUN: 17 mg/dL (ref 8–23)
CO2: 20 mmol/L — ABNORMAL LOW (ref 22–32)
Calcium: 9.7 mg/dL (ref 8.9–10.3)
Chloride: 109 mmol/L (ref 98–111)
Creatinine, Ser: 1.88 mg/dL — ABNORMAL HIGH (ref 0.44–1.00)
GFR, Estimated: 29 mL/min — ABNORMAL LOW (ref >60)
Glucose, Bld: 87 mg/dL (ref 70–99)
Potassium: 3.8 mmol/L (ref 3.5–5.1)
Sodium: 140 mmol/L (ref 135–145)

## 2024-06-03 LAB — CBC
HCT: 38.8 % (ref 36.0–46.0)
Hemoglobin: 12.9 g/dL (ref 12.0–15.0)
MCH: 30.7 pg (ref 26.0–34.0)
MCHC: 33.2 g/dL (ref 30.0–36.0)
MCV: 92.4 fL (ref 80.0–100.0)
Platelets: 211 K/uL (ref 150–400)
RBC: 4.2 MIL/uL (ref 3.87–5.11)
RDW: 13 % (ref 11.5–15.5)
WBC: 5 K/uL (ref 4.0–10.5)
nRBC: 0 % (ref 0.0–0.2)

## 2024-06-03 LAB — PRO BRAIN NATRIURETIC PEPTIDE: Pro Brain Natriuretic Peptide: 182 pg/mL (ref <300.0)

## 2024-06-03 LAB — TROPONIN T, HIGH SENSITIVITY
Troponin T High Sensitivity: <15 ng/L (ref 0–19)
Troponin T High Sensitivity: <15 ng/L (ref 0–19)

## 2024-06-03 MED ORDER — PROCHLORPERAZINE EDISYLATE 10 MG/2ML IJ SOLN
5.0000 mg | Freq: Once | INTRAMUSCULAR | Status: AC
  Administered 2024-06-03: 5 mg via INTRAVENOUS
  Filled 2024-06-03: qty 2

## 2024-06-03 MED ORDER — DIPHENHYDRAMINE HCL 50 MG/ML IJ SOLN
12.5000 mg | Freq: Once | INTRAMUSCULAR | Status: AC
  Administered 2024-06-03: 12.5 mg via INTRAVENOUS
  Filled 2024-06-03: qty 1

## 2024-06-03 MED ORDER — HYDROMORPHONE HCL 1 MG/ML IJ SOLN
0.5000 mg | Freq: Once | INTRAMUSCULAR | Status: AC
  Administered 2024-06-03: 0.5 mg via INTRAVENOUS
  Filled 2024-06-03: qty 1

## 2024-06-03 MED ORDER — DEXAMETHASONE SODIUM PHOSPHATE 10 MG/ML IJ SOLN
10.0000 mg | Freq: Once | INTRAMUSCULAR | Status: AC
  Administered 2024-06-03: 10 mg via INTRAVENOUS
  Filled 2024-06-03: qty 1

## 2024-06-03 NOTE — Discharge Instructions (Addendum)
 Follow-up with primary care.  Return if symptoms worsen.  Today workup is unremarkable.

## 2024-06-03 NOTE — ED Provider Notes (Signed)
 [place] [facility] Provider Note   CSN: [id number] Arrival date & time: 06/03/24  8042     Patient presents with: Chest Pain and Shortness of Breath   Paula Stout is a 68 y.o. female.   Palpitations migraine.  History of the same.  History of A-fib on anticoagulation.  History of IBS generalized anxiety disorder migraines.  No major chest discomfort at this time.  Just felt like bad palpitations earlier.  She had a migraine all week but not too bad right now.  She denies any cough sputum production.  No recent surgery or travel.  She is on anticoagulation for A-fib.  She denies any leg swelling shortness of breath.  No fever.  No neck pain.  No viral symptoms.  The history is provided by the patient.       Prior to Admission medications   Medication Sig Start Date End Date Taking? Authorizing Provider  acetaminophen  (TYLENOL ) 500 MG tablet Take 1,000 mg by mouth every 6 (six) hours as needed for mild pain.    [provider]  apixaban  (ELIQUIS ) 5 MG TABS tablet Take 1 tablet (5 mg total) by mouth 2 (two) times daily. 01/18/24   Frann Mabel Mt, DO  atorvastatin  (LIPITOR ) 10 MG tablet Take 1 tablet (10 mg total) by mouth daily. 03/14/24   Frann Mabel Mt, DO  Baclofen  5 MG TABS TAKE ONE TABLET BY MOUTH TWICE A DAY AS NEEDED 05/09/24   Skeet Cornet R, DO  blood glucose meter kit and supplies One Touch Ultra  Check blood sugars once daily Dx: E11.9 10/01/21   Frann Mabel Mt, DO  botulinum toxin Type A  (BOTOX ) 200 units injection Inject 155 units IM into multiple site in the face,neck and head once every 90 days 01/26/24   Skeet Cornet SAUNDERS, DO  budesonide -formoterol  (SYMBICORT ) 80-4.5 MCG/ACT inhaler Inhale 2 puffs into the lungs 2 (two) times daily. 05/01/24   Frann Mabel Mt, DO  buPROPion  (WELLBUTRIN  XL) 150 MG 24 hr tablet Take 1 tablet (150 mg total) by mouth daily. 04/18/24   Frann Mabel Mt, DO   Cholecalciferol (D 5000) 125 MCG (5000 UT) capsule Take 5,000 Units by mouth daily.    [provider]  Continuous Glucose Sensor (DEXCOM G7 SENSOR) MISC APPLY ONE SENSOR TO THE BACK OF YOUR UPPER ARM. REPLACE EVERY 10 DAYS AS DIRECTED 04/26/24   Frann Mabel Mt, DO  Cyanocobalamin  5000 MCG TBDP Take 5,000 mcg by mouth.    [provider]  EPINEPHRINE  0.3 mg/0.3 mL IJ SOAJ injection INJECT ONE PEN INTO THE THIGH OR AS DIRECTED. AFTER ADMINISTRATION CALL 911. IF ANAPHYLACTIC SYMPTOMS PERSIST AFTER FIRST DOSE, MAY REPEAT DOSE IN 5 TO 15 MINUTES. 04/26/24   Wendling, Mabel Mt, DO  escitalopram  (LEXAPRO ) 20 MG tablet Take 1 tablet (20 mg total) by mouth daily. 04/24/24   Teresa Redell LABOR, NP  ezetimibe  (ZETIA ) 10 MG tablet TAKE 1 TABLET DAILY 12/26/23   Frann Mabel Mt, DO  fluticasone  (FLONASE ) 50 MCG/ACT nasal spray Place 2 sprays into both nostrils daily. 05/01/24   Frann Mabel Mt, DO  Fremanezumab -vfrm (AJOVY ) 225 MG/1.5ML SOAJ INJECT 225 MG UNDER THE SKIN EVERY 30 DAYS 05/02/24   Skeet Cornet SAUNDERS, DO  levocetirizine (XYZAL ) 5 MG tablet Take 1 tablet (5 mg total) by mouth every evening. 05/01/24   Frann Mabel Mt, DO  LORazepam  (ATIVAN ) 0.5 MG tablet TAKE ONE TABLET BY MOUTH EVERY 8 HOURS 05/02/24   Teresa Redell  A, NP  Lurasidone  HCl 120 MG TABS Take 1 tablet (120 mg total) by mouth at bedtime. 04/24/24   Teresa Redell LABOR, NP  magnesium  oxide (MAG-OX) 400 (240 Mg) MG tablet Take 400 mg by mouth daily.    [provider]  midodrine  (PROAMATINE ) 10 MG tablet Take 1 tablet (10 mg total) by mouth 3 (three) times daily. 03/07/24   Frann Mabel Mt, DO  Multiple Vitamins-Minerals (MULTIVITAMIN WITH MINERALS) tablet Take 1 tablet by mouth daily. Daily Diabetes Health Pack - MVI + chromium, fish oil + vit D, magnesium , vit C, alpha lipoic acid w/ green tea    [provider]  nitroGLYCERIN  (NITROSTAT ) 0.4 MG SL tablet Place 1 tablet (0.4 mg  total) under the tongue every 5 (five) minutes as needed for chest pain. 03/16/23   Krasowski, Robert J, MD  ondansetron  (ZOFRAN ) 4 MG tablet Take 1 tablet (4 mg total) by mouth every 6 (six) hours. 01/09/24   Donnajean Lynwood DEL, PA-C  ONETOUCH VERIO test strip USE DAILY TO CHECK BLOOD SUGAR 10/29/22   Frann Mabel Mt, DO  pantoprazole  (PROTONIX ) 40 MG tablet TAKE 1 TABLET DAILY 12/26/23   Frann Mabel Mt, DO  QUEtiapine  (SEROQUEL ) 300 MG tablet Take 1 tablet (300 mg total) by mouth at bedtime. 04/24/24   Teresa Redell LABOR, NP  Rimegepant Sulfate  (NURTEC) 75 MG TBDP Take 1 tablet (75 mg total) by mouth as needed (take 1 tab at the earlist onset of a migraine. Max 1 tab in 24 hours). 05/25/24   Skeet Juliene SAUNDERS, DO  tirzepatide  (MOUNJARO ) 7.5 MG/0.5ML Pen Inject 7.5 mg into the skin once a week. 05/01/24   Frann Mabel Mt, DO  topiramate  (TOPAMAX ) 50 MG tablet TAKE 3 TABLETS TWICE A DAY 08/23/23   Wendling, Mabel Mt, DO    Allergies: Bee venom, Onion, Tetanus-diphtheria toxoids td, Sumatriptan , Zolpidem , Asa [aspirin ], Iodine, Nsaids, Sulfa antibiotics, and Vancomycin    Review of Systems  Updated Vital Signs BP 113/80 (BP Location: Right Arm)   Pulse 68   Temp 98 F (36.7 C) (Oral)   Resp 20   Ht 5' 6 (1.676 m)   Wt 70.3 kg   SpO2 100%   BMI 25.02 kg/m   Physical Exam Vitals and nursing note reviewed.  Constitutional:      General: She is not in acute distress.    Appearance: She is well-developed. She is not ill-appearing.  HENT:     Head: Normocephalic and atraumatic.  Eyes:     Extraocular Movements: Extraocular movements intact.     Conjunctiva/sclera: Conjunctivae normal.     Pupils: Pupils are equal, round, and reactive to light.  Cardiovascular:     Rate and Rhythm: Normal rate and regular rhythm.     Pulses:          Radial pulses are 2+ on the right side and 2+ on the left side.     Heart sounds: Normal heart sounds. No murmur heard. Pulmonary:      Effort: Pulmonary effort is normal. No respiratory distress.     Breath sounds: Normal breath sounds. No decreased breath sounds.  Abdominal:     Palpations: Abdomen is soft.     Tenderness: There is no abdominal tenderness.  Musculoskeletal:        General: No swelling.     Cervical back: Normal range of motion and neck supple.  Skin:    General: Skin is warm and dry.     Capillary Refill:  Capillary refill takes less than 2 seconds.  Neurological:     General: No focal deficit present.     Mental Status: She is alert and oriented to person, place, and time.     Cranial Nerves: No cranial nerve deficit.     Motor: No weakness.     Comments: 5+ out of 5 strength throughout, normal sensation, no drift, normal speech, normal finger-to-nose finger  Psychiatric:        Mood and Affect: Mood normal.     (all labs ordered are listed, but only abnormal results are displayed) Labs Reviewed  BASIC METABOLIC PANEL WITH GFR - Abnormal; Notable for the following components:      Result Value   CO2 20 (*)    Creatinine, Ser 1.88 (*)    GFR, Estimated 29 (*)    All other components within normal limits  CBC  PROTIME-INR  PRO BRAIN NATRIURETIC PEPTIDE  TROPONIN T, HIGH SENSITIVITY  TROPONIN T, HIGH SENSITIVITY    EKG: EKG Interpretation Date/Time:  Sunday June 03 2024 20:08:55 EDT Ventricular Rate:  82 PR Interval:  60 QRS Duration:  83 QT Interval:  492 QTC Calculation: 575 R Axis:   -28  Text Interpretation: Sinus rhythm Short PR interval B Confirmed by Ruthe Cornet [phone]) on 06/03/2024 8:18:46 PM  Radiology: CT Head Wo Contrast Result Date: 06/03/2024 EXAM: CT HEAD WITHOUT CONTRAST 06/03/2024 09:05:00 PM TECHNIQUE: CT of the head was performed without the administration of intravenous contrast. Automated exposure control, iterative reconstruction, and/or weight based adjustment of the mA/kV was utilized to reduce the radiation dose to as low as reasonably achievable.  COMPARISON: CT head 01/25/2024 CLINICAL HISTORY: Headache, increasing frequency or severity. Pt reports CP starting 30 minutes PTA with shortness of breath. 8/10 center chest pain with no radiation, describes as a heavy weight on her chest. Endorses n/v dizziness and headache. Denies cough, fevers, chills. FINDINGS: BRAIN AND VENTRICLES: No acute hemorrhage. No evidence of acute infarct. No hydrocephalus. No extra-axial collection. No mass effect or midline shift. ORBITS: No acute abnormality. SINUSES: No acute abnormality. SOFT TISSUES AND SKULL: No acute soft tissue abnormality. No skull fracture. IMPRESSION: 1. No acute intracranial abnormality. Electronically signed by: Norman Gatlin MD 06/03/2024 09:12 PM EDT RP Workstation: [id number]   DG Chest 2 View Result Date: 06/03/2024 EXAM: 2 VIEW(S) XRAY OF THE CHEST 06/03/2024 08:21:00 PM COMPARISON: 05/10/2024 CLINICAL HISTORY: chest pain. Acute onset chest pain w/ dzn and migraine x 1-2 hrs. Diab. FINDINGS: LUNGS AND PLEURA: No focal pulmonary opacity. No pulmonary edema. No pleural effusion. No pneumothorax. HEART AND MEDIASTINUM: No acute abnormality of the cardiac and mediastinal silhouettes. BONES AND SOFT TISSUES: No acute osseous abnormality. IMPRESSION: 1. No acute abnormalities. Electronically signed by: Norman Gatlin MD 06/03/2024 08:31 PM EDT RP Workstation: [id number]     Procedures   Medications Ordered in the ED  prochlorperazine  (COMPAZINE ) injection 5 mg (5 mg Intravenous Given 06/03/24 2134)  diphenhydrAMINE  (BENADRYL ) injection 12.5 mg (12.5 mg Intravenous Given 06/03/24 2133)  dexamethasone  (DECADRON ) injection 10 mg (10 mg Intravenous Given 06/03/24 2158)                                    Medical Decision Making Amount and/or Complexity of Data Reviewed Labs: ordered. Radiology: ordered.  Risk Prescription drug management.   Paula Stout is here with palpitations migraine.  Normal vitals.  No fever.  Well-appearing.  EKG shows sinus rhythm.  No ischemic changes.  Differential diagnosis likely migraine, stress related process seems less likely to be ACS.  She is anticoagulated and doubt PE.  She has no infectious symptoms.  CBC BMP troponin head CT chest x-ray were obtained.  Per my review interpretation labs no significant leukocytosis anemia electrolyte abnormality kidney injury.  Reactants at baseline.  Troponin negative x 2.  proBNP is unremarkable.  No concern for heart failure, ACS.  Overall atypical story.  I suspect stress related process.  She is on anticoagulation.  No concern for PE.  Head CT was unremarkable.  No concern for stroke or any other infectious process.  She is feeling much better after headache cocktail.  Chest x-ray showed no evidence of pneumonia or pneumothorax.  Overall given reassurance.  Follow-up with primary care.  Told to return if symptoms worsen.  Discharge.  This chart was dictated using voice recognition software.  Despite best efforts to proofread,  errors can occur which can change the documentation meaning.      Final diagnoses:  Atypical chest pain  Other migraine without status migrainosus, not intractable    ED Discharge Orders     None          Ruthe Cornet, DO 06/03/24 2217

## 2024-06-03 NOTE — ED Notes (Signed)
 Patient transported to X-ray

## 2024-06-03 NOTE — ED Triage Notes (Signed)
 Pt reports CP starting 30 minutes PTA with shortness of breath. 8/10 center chest pain with no radiation, describes as a heavy weight on her chest. Endorses n/v dizziness and headache. Denies cough, fevers, chills.

## 2024-06-04 ENCOUNTER — Telehealth: Payer: Self-pay | Admitting: Cardiology

## 2024-06-04 NOTE — Telephone Encounter (Signed)
 Left voice message to return call

## 2024-06-04 NOTE — Therapy (Incomplete)
 OUTPATIENT PHYSICAL THERAPY UPPER EXTREMITY EVALUATION   Patient Name: Paula Stout MRN: [id number] DOB:1956-04-25, 68 y.o., female Today's Date: 06/04/2024  END OF SESSION:   Past Medical History:  Diagnosis Date   Adhesive capsulitis of left shoulder 12/16/2016   Asthma without status asthmaticus 02/06/2021   At risk for falls 02/04/2019   Atrial fibrillation (HCC) 09/01/2014   Last Assessment & Plan:  Formatting of this note might be different from the original. A:  Chronic.  Sinus rhythm at this time.  States she was told this during admission at [facility] in the past. P:  On baby aspirin  at home will continue.  Low dose metoprolol  started.   Bipolar 1 disorder, depressed, moderate (HCC) 02/18/2020   Capsulitis of left shoulder 10/20/2021   Chronic anticoagulation 02/20/2020   Chronic atrial fibrillation (HCC) 02/18/2020   Chronic headache 10/11/2013   Chronic interstitial cystitis 12/28/2005   Chronic migraine without aura, intractable, without status migrainosus 06/24/2017   Colon cancer screening 04/15/2016   Last Assessment & Plan:  Formatting of this note might be different from the original. Will schedule for colonoscopy   Depression, recurrent 02/18/2020   Diabetes mellitus (HCC)    Dyslipidemia 02/20/2020   GAD (generalized anxiety disorder) 02/18/2020   GERD (gastroesophageal reflux disease) 11/05/2021   H/O chest pain 07/31/2021   History of atrial fibrillation 06/24/2017   History of posttraumatic stress disorder (PTSD) 10/07/2016   Hyperosmolarity due to secondary diabetes mellitus (HCC) 02/06/2021   Impaired mobility and activities of daily living 11/14/2015   Irritable bowel syndrome (IBS)    Junctional tachycardia 11/05/2021   Kidney stone 02/06/2021   Migraine without aura, not refractory 02/06/2021   Mixed hyperlipidemia 04/06/2021   Last Assessment & Plan:  Formatting of this note might be different from the original. Due for labs with PCP in November    Morbid obesity (HCC) 12/16/2016   Paroxysmal supraventricular tachycardia 11/05/2021   Pneumonia, organism unspecified(486) 11/21/2002   Polyneuropathy due to type 2 diabetes mellitus (HCC) 02/06/2021   Primary hypertension 04/06/2021   Last Assessment & Plan:  Formatting of this note might be different from the original. Controlled   Refractory migraine with aura 02/06/2021   Refractory migraine without aura 02/06/2021   Renal colic on left side 04/22/2017   Stage 3 chronic kidney disease (HCC) 02/20/2020   Thyroid  goiter    10/17/23 CT: right thyorid goiter with substernal extension, benign FNA 07/07/23   Unspecified adverse effect of other drug, medicinal and biological substance(995.29) 02/17/2006   Urinary tract obstruction 02/06/2021   Past Surgical History:  Procedure Laterality Date   ABDOMINAL HYSTERECTOMY     Fibroids   ATRIAL FIBRILLATION ABLATION     ATRIAL FIBRILLATION ABLATION N/A 10/27/2022   Procedure: ATRIAL FIBRILLATION ABLATION;  Surgeon: Inocencio Soyla Lunger, MD;  Location: [facility] INVASIVE CV LAB;  Service: Cardiovascular;  Laterality: N/A;   BRONCHIAL BIOPSY  11/08/2023   Procedure: BRONCHOSCOPY, WITH BIOPSY;  Surgeon: Shelah Lamar RAMAN, MD;  Location: [facility] ENDOSCOPY;  Service: Pulmonary;;   BRONCHIAL BRUSHINGS  11/08/2023   Procedure: BRONCHOSCOPY, WITH BRUSH BIOPSY;  Surgeon: Shelah Lamar RAMAN, MD;  Location: [facility] ENDOSCOPY;  Service: Pulmonary;;   BRONCHIAL NEEDLE ASPIRATION BIOPSY  11/08/2023   Procedure: BRONCHOSCOPY, WITH NEEDLE ASPIRATION BIOPSY;  Surgeon: Shelah Lamar RAMAN, MD;  Location: [facility] ENDOSCOPY;  Service: Pulmonary;;   BRONCHIAL WASHINGS  11/08/2023   Procedure: IRRIGATION, BRONCHUS;  Surgeon: Shelah Lamar RAMAN, MD;  Location: [facility] ENDOSCOPY;  Service: Pulmonary;;  CARDIAC CATHETERIZATION     CARDIOVERSION     CESAREAN SECTION     KNEE SURGERY     LEFT HEART CATH AND CORONARY ANGIOGRAPHY N/A 03/04/2020   Procedure: LEFT HEART CATH AND CORONARY ANGIOGRAPHY;  Surgeon:  Claudene Victory ORN, MD;  Location: [facility] INVASIVE CV LAB;  Service: Cardiovascular;  Laterality: N/A;   ROTATOR CUFF REPAIR     SVT ABLATION N/A 05/04/2023   Procedure: SVT ABLATION;  Surgeon: Inocencio Soyla Lunger, MD;  Location: [facility] INVASIVE CV LAB;  Service: Cardiovascular;  Laterality: N/A;   Patient Active Problem List   Diagnosis Date Noted   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   Multiple falls 01/26/2024   Syncope 09/18/2023   Enlarged pulmonary artery (HCC) 08/11/2023   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   Right thyroid  nodule 06/23/2023   Pulmonary nodule 1 cm or greater in diameter 06/08/2023   Hypercoagulable state due to paroxysmal atrial fibrillation (HCC) 11/24/2022   Pericarditis 10/27/2022   Junctional tachycardia 11/05/2021   GERD (gastroesophageal reflux disease) 11/05/2021   Paroxysmal supraventricular tachycardia 11/05/2021   Capsulitis of left shoulder 10/20/2021   ACS (acute coronary syndrome) (HCC) 07/31/2021   Primary hypertension 04/06/2021   Mixed hyperlipidemia 04/06/2021   Hyperglycemia due to type 2 diabetes mellitus (HCC) 02/06/2021   Hyperosmolarity due to secondary diabetes mellitus (HCC) 02/06/2021   Insomnia 02/06/2021   Kidney stone 02/06/2021   Polyneuropathy due to type 2 diabetes mellitus (HCC) 02/06/2021   Urinary tract obstruction 02/06/2021   Asthma without status asthmaticus 02/06/2021   Migraine without aura, not refractory 02/06/2021   Refractory migraine with aura 02/06/2021   Refractory migraine without aura 02/06/2021   Depressed bipolar I disorder (HCC) 02/06/2021   SVT (supraventricular tachycardia)    Paroxysmal atrial fibrillation (HCC)    Bipolar disorder (HCC)    Diabetes mellitus without complication (HCC)    Diabetes mellitus due to underlying condition with unspecified complications (HCC) 04/14/2020   Anxiety    Depression    Diabetes mellitus (HCC)    Irritable bowel syndrome (IBS)    Dyslipidemia 02/20/2020   Chronic  anticoagulation 02/20/2020   Stage 3 chronic kidney disease (HCC) 02/20/2020   Chest pain 02/19/2020   Hypertriglyceridemia 02/18/2020   Depression, recurrent 02/18/2020   Bipolar 1 disorder, depressed, moderate (HCC) 02/18/2020   GAD (generalized anxiety disorder) 02/18/2020   At risk for falls 02/04/2019   Chronic migraine without aura, intractable, without status migrainosus 06/24/2017   History of atrial fibrillation 06/24/2017   Acute cystitis 05/14/2017   Renal colic on left side 04/22/2017   Adhesive capsulitis of left shoulder 12/16/2016   Morbid obesity (HCC) 12/16/2016   History of posttraumatic stress disorder (PTSD) 10/07/2016   Colon cancer screening 04/15/2016   Heartburn 04/15/2016   Atrial fibrillation (HCC) 09/01/2014   Obesity (BMI 30-39.9) 09/01/2014   Asthma 09/01/2014   Unstable angina (HCC) 09/01/2014   HLD (hyperlipidemia) 09/01/2014   Type 2 diabetes mellitus with obesity 10/11/2013   Migraines 10/11/2013   Bipolar 1 disorder (HCC) 10/11/2013   Chronic headache 10/11/2013   Dysmetabolic syndrome X 03/20/2008   Unspecified adverse effect of other drug, medicinal and biological substance(995.29) 02/17/2006   Chronic interstitial cystitis 12/28/2005   Pneumonia, organism unspecified(486) 11/21/2002    PCP: Frann Mabel Mt, DO   REFERRING PROVIDER: Jerri Kay HERO, MD   REFERRING DIAG: [phone] (ICD-10-CM) - Chronic right shoulder pain  THERAPY DIAG:  No diagnosis found.  RATIONALE FOR EVALUATION AND TREATMENT: Rehabilitation  ONSET DATE:  Approximately 2 months ago  NEXT MD VISIT:  PRN  SUBJECTIVE:                                                                                                                                                                                                         SUBJECTIVE STATEMENT: 68 y/o patient referred to PT from Dr Jerri for healed R distal clavicle fx and possible RTC strain.  Patient reports she  fell.   Patient has returned to PCP a couple of weeks ago and received an injection in the R shoulder as well.     PAIN: Are you having pain? Yes: NPRS scale: *** Pain location: *** Pain description: *** Aggravating factors: *** Relieving factors: ***  PERTINENT HISTORY:  H/o L adhesive capsulitis, multiple falls, Diabetes, neuropathy, CKD, Afib, bipolar, chronic migraines, depression,   PRECAUTIONS: Fall, neuropathy  HAND DOMINANCE: {Hand Dominance:29389}  RED FLAGS: {PT Red Flags:29287}  WEIGHT BEARING RESTRICTIONS: {Yes ***/No:24003}  FALLS:  Has patient fallen in last 6 months? {fallsyesno:27318}  LIVING ENVIRONMENT: Lives with: {OPRC lives with:25569::lives with their family} Lives in: {Lives in:25570} Stairs: {opstairs:27293} Has following equipment at home: {Assistive devices:23999}  OCCUPATION: ***  PLOF: {PLOF:24004}  PATIENT GOALS: ***   OBJECTIVE: (objective measures completed at initial evaluation unless otherwise dated)  DIAGNOSTIC FINDINGS:  X-rays of the right clavicle show arthritic changes of the [facility] joint.  No  acute abnormalities.  Healed distal clavicle fracture. (Per EPIC xray report)  PATIENT SURVEYS:  Quick Dash:  QUICK DASH  Please rate your ability do the following activities in the last week by selecting the number below the appropriate response.   Activities Rating  Open a tight or new jar.  {Q-dash (1-6):32984}  Do heavy household chores (e.g., wash walls, floors). {Q-dash Development worker, community  Carry a shopping bag or briefcase {Q-dash [facility] your back. {Q-dash (1-6):32984}  Use a knife to cut food. {Q-dash (1-6):32984}  Recreational activities in which you take some force or impact through your arm, shoulder or hand (e.g., golf, hammering, tennis, etc.). {Q-dash (1-6):32984}  During the past week, to what extent has your arm, shoulder or hand problem interfered with your normal social activities with family, friends, neighbors  or groups?  {Q-Dash 7:32985}  During the past week, were you limited in your work or other regular daily activities as a result of your arm, shoulder or hand problem? {Q-dash 8:32988}  Rate the severity of the following symptoms in the last week: Arm, Shoulder, or hand pain. {Q-dash 0-89:67013}  Rate the severity of the  following symptoms in the last week: Tingling (pins and needles) in your arm, shoulder or hand. {Q-dash 0-89:67013}  During the past week, how much difficulty have you had sleeping because of the pain in your arm, shoulder or hand?  {Q-dash 11:32987}   (A QuickDASH score may not be calculated if there is greater than 1 missing item.)  Quick Dash Disability/Symptom Score: [(sum of *** (n) responses/*** (n)] x 25 = ***  Minimally Clinically Important Difference (MCID): 15-20 points  (Franchignoni, F. et al. (2013). Minimally clinically important difference of the disabilities of the arm, shoulder, and hand outcome measures (DASH) and its shortened version (Quick DASH). Journal of Orthopaedic & Sports Physical Therapy, 44(1), 30-39)   COGNITION: Overall cognitive status: {cognition:24006}     SENSATION: {sensation:27233}  POSTURE: ***  UPPER EXTREMITY ROM:   {AROM/PROM:27142} ROM Right eval Left eval  Shoulder flexion    Shoulder extension    Shoulder abduction    Shoulder adduction    Shoulder internal rotation    Shoulder external rotation    Elbow flexion    Elbow extension    Wrist flexion    Wrist extension    Wrist ulnar deviation    Wrist radial deviation    Wrist pronation    Wrist supination    (Blank rows = not tested)  {AROM/PROM:27142} ROM Right eval Left eval  Thumb MCP (0-60)    Thumb IP (0-80)    Thumb Radial abd/add (0-55)     Thumb Palmar abd/add (0-45)     Thumb Opposition to Small Finger     Index MCP (0-90)     Index PIP (0-100)     Index DIP (0-70)      Long MCP (0-90)      Long PIP (0-100)      Long DIP (0-70)      Ring MCP  (0-90)      Ring PIP (0-100)      Ring DIP (0-70)      Little MCP (0-90)      Little PIP (0-100)      Little DIP (0-70)      (Blank rows = not tested)  UPPER EXTREMITY MMT:  MMT Right eval Left eval  Shoulder flexion    Shoulder extension    Shoulder abduction    Shoulder adduction    Shoulder internal rotation    Shoulder external rotation    Middle trapezius    Lower trapezius    Elbow flexion    Elbow extension    Wrist flexion    Wrist extension    Wrist ulnar deviation    Wrist radial deviation    Wrist pronation    Wrist supination    Grip strength (lbs)    (Blank rows = not tested)  SHOULDER SPECIAL TESTS: Impingement tests: {shoulder impingement test:25231:a} SLAP lesions: {SLAP lesions:25232} Instability tests: {shoulder instability test:25233} Rotator cuff assessment: {rotator cuff assessment:25234} Biceps assessment: {biceps assessment:25235}  JOINT MOBILITY TESTING:  ***  PALPATION:  ***    TODAY'S TREATMENT:   ***   PATIENT EDUCATION:  Education details: {Education details:27468}  Person educated: {Person educated:25204} Education method: {Education Method EU:67125} Education comprehension: {Education Comprehension:25206}  HOME EXERCISE PROGRAM: ***   ASSESSMENT:  CLINICAL IMPRESSION: Savhanna Sliva is a 68 y.o. female who was referred to physical therapy for evaluation and treatment for ***. ***   Patient reports onset of *** pain beginning ***. Pain is worse with ***.  Patient has deficits in ***  ROM, *** flexibility, *** strength, ***abnormal posture, and TTP with abnormal muscle tension *** which are interfering with ADLs and are impacting quality of life.  On QuickDASH patient scored ***/100 demonstrating ***% disability.  [place] will benefit from skilled PT to address above deficits to improve mobility and activity tolerance with decreased pain interference.  OBJECTIVE IMPAIRMENTS: {opptimpairments:25111}.   ACTIVITY  LIMITATIONS: {activitylimitations:27494}  PARTICIPATION LIMITATIONS: {participationrestrictions:25113}  PERSONAL FACTORS: Age, Fitness, Past/current experiences, Time since onset of injury/illness/exacerbation, and 3+ comorbidities: H/o L adhesive capsulitis, multiple falls, Diabetes, neuropathy, CKD, Afib, bipolar, chronic migraines, depression,  are also affecting patient's functional outcome.   REHAB POTENTIAL: Good  CLINICAL DECISION MAKING: Evolving/moderate complexity  EVALUATION COMPLEXITY: Moderate   GOALS: Goals reviewed with patient? Yes  SHORT TERM GOALS: Target date: ***  Patient will be independent with initial HEP to improve outcomes and carryover.  Baseline: *** Goal status: {GOALSTATUS:25110}  2.  Patient will report 25% improvement in *** pain to improve QOL.  Baseline: *** Goal status: {GOALSTATUS:25110}  3.  *** Baseline: *** Goal status: {GOALSTATUS:25110}  LONG TERM GOALS: Target date: ***  Patient will be independent with ongoing/advanced HEP for self-management at home.  Baseline: *** Goal status: {GOALSTATUS:25110}  2.  Patient will report 50-75% improvement in *** pain to improve QOL.  Baseline: *** Goal status: {GOALSTATUS:25110}  3.  Patient to demonstrate improved upright posture with posterior shoulder girdle engaged to promote improved functional UE use. Baseline: *** Goal status: {GOALSTATUS:25110}  4.  Patient to improve *** AROM to {Functional status:27472} without pain provocation to allow for increased ease of ADLs.  Baseline: Refer to above UE ROM table Goal status: {GOALSTATUS:25110}  5.  Patient will demonstrate improved *** strength to >/= ***/5 for functional UE use. Baseline: Refer to above UE MMT table Goal status: {GOALSTATUS:25110}  6  Patient will report </= ***% on QuickDASH (MCID = 14%) overall more so to demonstrate improved functional ability.  Baseline: *** Goal status: {GOALSTATUS:25110}  7.  Patient will ***    Baseline: *** Goal status: {GOALSTATUS:25110}   8. *** Baseline: *** Goal status: {GOALSTATUS:25110}   PLAN:  PT FREQUENCY: {rehab frequency:25116}  PT DURATION: {rehab duration:25117}  PLANNED INTERVENTIONS: {rehab planned interventions:25118::97110-Therapeutic exercises,97530- Therapeutic [phone]- Neuromuscular re-education,97535- Self Rjmz,02859- Manual therapy,Patient/Family education}  PLAN FOR NEXT SESSION: PIERRETTE RED SENIOR, PT 06/04/2024, 10:26 PM

## 2024-06-04 NOTE — Telephone Encounter (Signed)
 Caller (Zoe) wants a call back to patient directly as patient was seen in the ED for chest pain and was sent home.  Caller noted patient has not take any nitroglycerin .

## 2024-06-05 ENCOUNTER — Ambulatory Visit (INDEPENDENT_AMBULATORY_CARE_PROVIDER_SITE_OTHER): Payer: Medicare (Managed Care) | Admitting: Family Medicine

## 2024-06-05 ENCOUNTER — Ambulatory Visit: Payer: Medicare (Managed Care) | Admitting: Rehabilitation

## 2024-06-05 ENCOUNTER — Encounter: Payer: Self-pay | Admitting: Family Medicine

## 2024-06-05 VITALS — BP 130/78 | HR 71 | Temp 96.3°F | Resp 16 | Ht 66.0 in | Wt 155.0 lb

## 2024-06-05 DIAGNOSIS — T50905D Adverse effect of unspecified drugs, medicaments and biological substances, subsequent encounter: Secondary | ICD-10-CM | POA: Diagnosis not present

## 2024-06-05 DIAGNOSIS — G250 Essential tremor: Secondary | ICD-10-CM

## 2024-06-05 MED ORDER — TIRZEPATIDE 5 MG/0.5ML ~~LOC~~ SOAJ: 5.0000 mg | SUBCUTANEOUS | 1 refills | Status: DC

## 2024-06-05 MED ORDER — PRIMIDONE 50 MG PO TABS: 50.0000 mg | ORAL_TABLET | Freq: Every day | ORAL | 1 refills | Status: DC

## 2024-06-05 NOTE — Telephone Encounter (Signed)
 Called number and was placed on hold and call disconnected.

## 2024-06-05 NOTE — Telephone Encounter (Signed)
 Spoke with Paula Stout who requested we call the pt. Appointment made for FU as pt requested. Pt states that she feels better today.

## 2024-06-05 NOTE — Progress Notes (Signed)
 Chief Complaint  Patient presents with   Tremors    Tremors    Subjective: Patient is a 68 y.o. female here for tremors. Here w daughter.   Hands shake when she is trying to do something. This has been going on for 1 mo. No other areas are shaking. Voice is stable. Getting worse.  She does not drink alcohol.  No family history of benign essential tremor.  She does not shake when she is resting.  Patient has been throwing up for 3 days after each Mounjaro  shot.  She is currently taking 7.5 mg weekly.  She was on 15 mg weekly at one point and her dosage was reduced.  This did seem to help with the symptoms.  Past Medical History:  Diagnosis Date   Adhesive capsulitis of left shoulder 12/16/2016   Asthma without status asthmaticus 02/06/2021   At risk for falls 02/04/2019   Atrial fibrillation (HCC) 09/01/2014   Last Assessment & Plan:  Formatting of this note might be different from the original. A:  Chronic.  Sinus rhythm at this time.  States she was told this during admission at [facility] in the past. P:  On baby aspirin  at home will continue.  Low dose metoprolol  started.   Bipolar 1 disorder, depressed, moderate (HCC) 02/18/2020   Capsulitis of left shoulder 10/20/2021   Chronic anticoagulation 02/20/2020   Chronic atrial fibrillation (HCC) 02/18/2020   Chronic headache 10/11/2013   Chronic interstitial cystitis 12/28/2005   Chronic migraine without aura, intractable, without status migrainosus 06/24/2017   Colon cancer screening 04/15/2016   Last Assessment & Plan:  Formatting of this note might be different from the original. Will schedule for colonoscopy   Depression, recurrent 02/18/2020   Diabetes mellitus (HCC)    Dyslipidemia 02/20/2020   GAD (generalized anxiety disorder) 02/18/2020   GERD (gastroesophageal reflux disease) 11/05/2021   H/O chest pain 07/31/2021   History of atrial fibrillation 06/24/2017   History of posttraumatic stress disorder (PTSD) 10/07/2016    Hyperosmolarity due to secondary diabetes mellitus (HCC) 02/06/2021   Impaired mobility and activities of daily living 11/14/2015   Irritable bowel syndrome (IBS)    Junctional tachycardia 11/05/2021   Kidney stone 02/06/2021   Migraine without aura, not refractory 02/06/2021   Mixed hyperlipidemia 04/06/2021   Last Assessment & Plan:  Formatting of this note might be different from the original. 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 Controlled   Refractory migraine with aura 02/06/2021   Refractory migraine without aura 02/06/2021   Renal colic on left side 04/22/2017   Stage 3 chronic kidney disease (HCC) 02/20/2020   Thyroid  goiter    10/17/23 CT: right thyorid goiter with substernal extension, benign FNA 07/07/23   Unspecified adverse effect of other drug, medicinal and biological substance(995.29) 02/17/2006   Urinary tract obstruction 02/06/2021    Objective: BP 130/78 (BP Location: Left Arm, Patient Position: Sitting)   Pulse 71   Temp (!) 96.3 F (35.7 C) (Temporal)   Resp 16   Ht 5' 6 (1.676 m)   Wt 155 lb (70.3 kg)   SpO2 98%   BMI 25.02 kg/m  General: Awake, appears stated age MSK: No asymmetry  Neuro: Intention tremor noted of bilateral hands.  There is no resting tremor.  5/5 grip strength.  Slight tremor when testing her strength. Lungs: No accessory muscle use Psych: Age appropriate judgment and insight, normal affect and mood  Assessment and Plan: Benign essential tremor - Plan: primidone (MYSOLINE) 50 MG tablet  Adverse effect of drug, subsequent encounter  Chronic, not controlled.  Start primidone 50 mg nightly.  Follow-up in 1 mo to  recheck this.  Could consider increasing the dosage versus adding propranolol. Adverse effect of chronic medication.  Decrease Mounjaro  dosage from 7.5 mg weekly to 5 mg weekly.  She will let me know how she is doing on this.  May need to reduce to 2.5 mg weekly or stop altogether.  Could consider changing to Ozempic . The patient and her daughter voiced understanding and agreement to the plan.  Mabel Mt Spencer, DO 06/05/24  4:26 PM

## 2024-06-06 ENCOUNTER — Encounter: Payer: Self-pay | Admitting: Family Medicine

## 2024-06-06 ENCOUNTER — Other Ambulatory Visit: Payer: Self-pay | Admitting: Family Medicine

## 2024-06-06 ENCOUNTER — Other Ambulatory Visit: Payer: Self-pay | Admitting: Neurology

## 2024-06-06 ENCOUNTER — Other Ambulatory Visit: Payer: Self-pay

## 2024-06-06 DIAGNOSIS — G250 Essential tremor: Secondary | ICD-10-CM

## 2024-06-06 MED ORDER — PRIMIDONE 50 MG PO TABS: 50.0000 mg | ORAL_TABLET | Freq: Every day | ORAL | 1 refills | Status: DC

## 2024-06-07 ENCOUNTER — Encounter: Payer: Self-pay | Admitting: Behavioral Health

## 2024-06-07 ENCOUNTER — Ambulatory Visit: Payer: Medicare (Managed Care) | Admitting: Behavioral Health

## 2024-06-07 DIAGNOSIS — F411 Generalized anxiety disorder: Secondary | ICD-10-CM | POA: Diagnosis not present

## 2024-06-07 DIAGNOSIS — F331 Major depressive disorder, recurrent, moderate: Secondary | ICD-10-CM | POA: Diagnosis not present

## 2024-06-07 NOTE — Progress Notes (Signed)
 [facility] Counselor/Therapist Progress Note  Patient ID: Paula Stout, MRN: [id number],    Date: 06/07/2024 Time Spent: 1 PM until 1:54 PM, 54 minutes. The patient consented to the video teletherapy and was located in her home during this session. She is aware it is the responsibility of the patient to secure confidentiality on her end of the session. The provider was in a private home  for the duration of this session.      Treatment Type: Individual Therapy  Reported Symptoms: Anxiety, depression  Mental Status Exam: Appearance:  Casual     Behavior: Appropriate  Motor: Normal  Speech/Language:  Normal Rate  Affect: Appropriate  Mood: normal  Thought process: normal  Thought content:   WNL  Sensory/Perceptual disturbances:   WNL  Orientation: oriented to person, place, time/date, situation, day of week, month of year, and year  Attention: Good  Concentration: Good  Memory: WNL  Fund of knowledge:  Good  Insight:   Good  Judgment:  Good  Impulse Control: Good   Risk Assessment: Danger to Self:  No Self-injurious Behavior: No Danger to Others: No Duty to Warn:no Physical Aggression / Violence:No  Access to Firearms a concern: No  Gang Involvement:No   Subjective: There have been some positives.  She now has 2 beds packed and her daughter helped her to get him down the stairs.  She is working on another 1 as she has time and feels like it.  She has been discussing with her children in [place]  effective ways to get healthier including her son flying to get her or her grandson coming up from [place] to ride with her out there.  They are looking at cost as well as availability with a goal of 4 to 6 weeks out from now getting her to [place] .  There continues to be difficulties in the home which she knows she has no control over but is staying in her room and is being as productive as she can.  She did have 1 migraine and chest pains episode which was  treated at the hospital.  That does seem to be better but a follow-up with her cardiologist is not until the end of October.  She is meeting regularly with her primary care physician.  She feels that she is doing as much as she can to maintain her mental emotional and physical health but knows that changing environments will make a significant difference. She does contract for safety having no thoughts of hurting herself or anyone else. Diagnosis: Generalized anxiety disorder, major depressive disorder, moderate, recurrent Plan: I will meet with the patient weekly via care agility.  Treatment plan: We will use cognitive behavioral therapy as well as person centered and supportive therapy in addition to elements of dialectical behavior therapy to help reduce the patient's anxiety and depression by at least 50% with a target date of October 06, 2023.  Goals for improving depression may include having less sadness as indicated by patient report and scores on the PHQ-9, have improved mood and return to a healthier level of functioning, identify causes including environment for depressed mood and learn ways to cope with depression especially those connected to her medical issues.  Interventions include using cognitive behavioral therapy to explore and replace thoughts and behaviors.  We will look at how depression is experienced in day-to-day living and encouraged sharing of feelings.  We will encourage the use of coping skills for management of depressive symptoms.  Goals for reducing anxiety  are to improve her ability to manage anxiety symptoms, better handle stress, identify causes for anxiety and explore ways to lower it, resolve the core conflicts contributing to anxiety as well as manage thoughts and worrisome thinking contributing to feelings of anxiety.  Interventions will include providing education about anxiety, facilitate problem solution skills to help her identify options for resolving stress, teach  coping skills for managing anxiety as well as mindfulness and communication in terms of family to reduce her stress level, use cognitive behavior therapy to identify and change anxiety provoking thought and behavior patterns as well as teach distress tolerance and mindfulness skills for alleviating anxiety. Progress: 40% progress notes were reviewed with a new extended target date of  January 31st, 2026.  Lorrene CHRISTELLA Hasten, [facility]                                         Lorrene CHRISTELLA Hasten, [facility]               Lorrene CHRISTELLA Hasten, [facility]               Lorrene CHRISTELLA Hasten, [facility]               Lorrene CHRISTELLA Hasten, [facility]               Lorrene CHRISTELLA Hasten, [facility]               Lorrene CHRISTELLA Hasten, [facility]               Lorrene CHRISTELLA Hasten, [facility]               Lorrene CHRISTELLA Hasten, [facility]               Lorrene CHRISTELLA Hasten, [facility]               Lorrene CHRISTELLA Hasten, [facility]               Lorrene CHRISTELLA Hasten, [facility]               Lorrene CHRISTELLA Hasten, [facility]               Lorrene CHRISTELLA Hasten, [facility]               Lorrene CHRISTELLA Hasten, [facility]               Lorrene CHRISTELLA Hasten, [facility]               Lorrene CHRISTELLA Hasten, [facility]

## 2024-06-11 ENCOUNTER — Other Ambulatory Visit: Payer: Self-pay

## 2024-06-11 MED ORDER — BUPROPION HCL ER (XL) 150 MG PO TB24: 150.0000 mg | ORAL_TABLET | Freq: Every day | ORAL | 1 refills | Status: DC

## 2024-06-14 ENCOUNTER — Ambulatory Visit (INDEPENDENT_AMBULATORY_CARE_PROVIDER_SITE_OTHER): Payer: Medicare (Managed Care) | Admitting: Behavioral Health

## 2024-06-14 ENCOUNTER — Encounter: Payer: Self-pay | Admitting: Behavioral Health

## 2024-06-14 DIAGNOSIS — F411 Generalized anxiety disorder: Secondary | ICD-10-CM | POA: Diagnosis not present

## 2024-06-14 DIAGNOSIS — F331 Major depressive disorder, recurrent, moderate: Secondary | ICD-10-CM

## 2024-06-14 NOTE — Progress Notes (Signed)
 [facility] Counselor/Therapist Progress Note  Patient ID: Paula Stout, MRN: [id number],    Date: 06/14/2024 Time Spent: 3 PM until 3:57 PM, 57 minutes. The patient consented to the video teletherapy and was located in her home during this session. She is aware it is the responsibility of the patient to secure confidentiality on her end of the session. The provider was in a private home  for the duration of this session.      Treatment Type: Individual Therapy  Reported Symptoms: Anxiety, depression  Mental Status Exam: Appearance:  Casual     Behavior: Appropriate  Motor: Normal  Speech/Language:  Normal Rate  Affect: Appropriate  Mood: normal  Thought process: normal  Thought content:   WNL  Sensory/Perceptual disturbances:   WNL  Orientation: oriented to person, place, time/date, situation, day of week, month of year, and year  Attention: Good  Concentration: Good  Memory: WNL  Fund of knowledge:  Good  Insight:   Good  Judgment:  Good  Impulse Control: Good   Risk Assessment: Danger to Self:  No Self-injurious Behavior: No Danger to Others: No Duty to Warn:no Physical Aggression / Violence:No  Access to Firearms a concern: No  Gang Involvement:No   Subjective: The patient has taken a couple of steps closer to the move.  She booked a flight to [facility]  on November 19.  She found out that she can fly out of [place] with her dog so she committed to that flight.  She has committed to a carrier who will hold her call her to [place]  and they will pick that up on November 15.  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  It is hard to find motivation because she looks in her room and feels overwhelmed.  She is getting very little cooperation from her son and daughter-in-law.  Even though they want she had her dog out of the house the only thing we have done is to help move  to the containers that she has gotten filled out to the garage.  There will let anyone come in the house to help her pack things up.  The daughter is not offering to help back anything up so I asked her to speak to the daughter about committed to 1 hour/week over the next few weeks while her bigger dogs downstairs are created and they can work easier.  In addition to that I asked her to try and compartmentalize to work about 30 minutes at a time and reward herself with playing on her phone as an escape so she can get a little bit done gradually.  For the most part medically she has felt pretty well and will meet with a cardiologist soon.  She does contract for safety having no thoughts of hurting herself or anyone else. Diagnosis: Generalized anxiety disorder, major depressive disorder, moderate, recurrent Plan: I will meet with the patient weekly via care agility.  Treatment plan: We will use cognitive behavioral therapy as well as person centered and supportive therapy in addition to elements of dialectical behavior therapy to help reduce the patient's anxiety and depression by at least 50% with a target date of October 06, 2023.  Goals for improving depression may include having less sadness as indicated by patient report and scores on the PHQ-9, have improved mood and return to a healthier level of functioning, identify causes including  environment for depressed mood and learn ways to cope with depression especially those connected to her medical issues.  Interventions include using cognitive behavioral therapy to explore and replace thoughts and behaviors.  We will look at how depression is experienced in day-to-day living and encouraged sharing of feelings.  We will encourage the use of coping skills for management of depressive symptoms.  Goals for reducing anxiety are to improve her ability to manage anxiety symptoms, better handle stress, identify causes for anxiety and explore ways to lower it,  resolve the core conflicts contributing to anxiety as well as manage thoughts and worrisome thinking contributing to feelings of anxiety.  Interventions will include providing education about anxiety, facilitate problem solution skills to help her identify options for resolving stress, teach coping skills for managing anxiety as well as mindfulness and communication in terms of family to reduce her stress level, use cognitive behavior therapy to identify and change anxiety provoking thought and behavior patterns as well as teach distress tolerance and mindfulness skills for alleviating anxiety. Progress: 40% progress notes were reviewed with a new extended target date of  January 31st, 2026.  Lorrene CHRISTELLA Hasten, [facility]                                         Lorrene CHRISTELLA Hasten, [facility]               Lorrene CHRISTELLA Hasten, [facility]               Lorrene CHRISTELLA Hasten, [facility]               Lorrene CHRISTELLA Hasten, [facility]               Lorrene CHRISTELLA Hasten, [facility]               Lorrene CHRISTELLA Hasten, [facility]               Lorrene CHRISTELLA Hasten, [facility]               Lorrene CHRISTELLA Hasten, [facility]               Lorrene CHRISTELLA Hasten, [facility]               Lorrene CHRISTELLA Hasten, [facility]               Lorrene CHRISTELLA Hasten, [facility]               Lorrene CHRISTELLA Hasten, [facility]               Lorrene CHRISTELLA Hasten, [facility]               Lorrene CHRISTELLA Hasten, [facility]               Lorrene CHRISTELLA Hasten, [facility]               Lorrene CHRISTELLA Hasten, [facility]               Lorrene CHRISTELLA Hasten, [facility]

## 2024-06-15 ENCOUNTER — Other Ambulatory Visit: Payer: Self-pay

## 2024-06-15 ENCOUNTER — Encounter: Payer: Self-pay | Admitting: Family Medicine

## 2024-06-15 ENCOUNTER — Telehealth: Payer: Self-pay

## 2024-06-15 DIAGNOSIS — G250 Essential tremor: Secondary | ICD-10-CM

## 2024-06-15 MED ORDER — PRIMIDONE 50 MG PO TABS: 50.0000 mg | ORAL_TABLET | Freq: Every day | ORAL | 1 refills | Status: DC

## 2024-06-15 NOTE — Telephone Encounter (Signed)
 Copied from CRM [id number]. Topic: Clinical - Prescription Issue >> Jun 15, 2024  3:53 PM Mercedes MATSU wrote: Reason for CRM: [facility] taking the received a rx for primidone (MYSOLINE) 50 MG tablet , this medication may decrease the effectiveness of ELIQUIS  5 MG TABS tablet and Lurasidone  HCl 120 MG TABS. Pharmacy wanted to find  if the doctor is aware if that and if they should proceed with dispensing  P: [phone] Ref #: [id number]

## 2024-06-16 NOTE — Telephone Encounter (Signed)
 I don't see an interaction between Latuda  and Eliquis  or Primidone. I saw the interaction with the Eliquis  was for the Primidone-phenobarb combo? What does the pharmacy team recommend?

## 2024-06-18 ENCOUNTER — Telehealth: Payer: Self-pay

## 2024-06-18 ENCOUNTER — Other Ambulatory Visit: Payer: Self-pay | Admitting: Family Medicine

## 2024-06-18 NOTE — Telephone Encounter (Signed)
 Copied from CRM [id number]. Topic: Clinical - Prescription Issue >> Jun 15, 2024  3:53 PM Mercedes MATSU wrote: Reason for CRM: [facility] taking the received a rx for primidone (MYSOLINE) 50 MG tablet , this medication may decrease the effectiveness of ELIQUIS  5 MG TABS tablet and Lurasidone  HCl 120 MG TABS. Pharmacy wanted to find  if the doctor is aware if that and if they should proceed with dispensing  P: [phone] Ref #: [id number] >> Jun 18, 2024  4:03 PM Rosina BIRCH wrote: [facility] is following up on a recent message [phone] Reference# [id number]

## 2024-06-18 NOTE — Telephone Encounter (Signed)
 Called pharmacy [facility] for them per Dr.Wendling said he saw the interaction with the Eliquis  was for the Primidone-phenobarb combo? Dr.Wendling said what the pharmacy recommend.

## 2024-06-19 ENCOUNTER — Other Ambulatory Visit: Payer: Self-pay | Admitting: Family Medicine

## 2024-06-19 ENCOUNTER — Other Ambulatory Visit: Payer: Self-pay

## 2024-06-19 NOTE — Telephone Encounter (Signed)
 primidone (MYSOLINE) 50 MG  has been discontinue per Dr.Wendling

## 2024-06-19 NOTE — Telephone Encounter (Signed)
 Let's not fill the primidone.

## 2024-06-20 ENCOUNTER — Ambulatory Visit: Payer: Medicare (Managed Care) | Admitting: Pharmacist

## 2024-06-20 DIAGNOSIS — I480 Paroxysmal atrial fibrillation: Secondary | ICD-10-CM

## 2024-06-20 DIAGNOSIS — E1165 Type 2 diabetes mellitus with hyperglycemia: Secondary | ICD-10-CM

## 2024-06-20 DIAGNOSIS — E782 Mixed hyperlipidemia: Secondary | ICD-10-CM

## 2024-06-20 NOTE — Progress Notes (Signed)
 06/20/24 Name: Paula Stout MRN: [id number] DOB: 1956-01-05  Chief Complaint  Patient presents with   Medication Management    Paula Stout is a 68 y.o. year old female.   They were referred to the pharmacist by their PCP for assistance in managing medication access and complex medication management.   Subjective: Patient has  been approved for Extra Help from Medicare. Medications are now $0, $4 or $12.90.  She reports she will be moving to [place] to live closer to her daughter in November 2025.   Patient reports affordability concerns with their medications: No  - improved since she was approved for LIS / Medicare Extra Help Patient reports access/transportation concerns to their pharmacy: No  Patient reports adherence concerns with their medications:  No  -     Type 2 DM:  Current therapy: Mounjaro  5mg  weekly, Farxiga  10mg  daily   Previous medications tried: Ozempic  - stopped due to nausea. Trulicity  - stopped when Mounjaro  started  Home blood glucose: Has started using Smart Log app on phone to record blood glucose Blood glucose average has been 100 to 110  Mounjaro :  Starting weight = 214 lbs BMI = 34.62; A1c = 6.5% Last weight was 155 lbs (has lost 59 lbs)   Wt Readings from Last 3 Encounters:  06/05/24 155 lb (70.3 kg)  06/03/24 155 lb (70.3 kg)  05/28/24 160 lb (72.6 kg)    CKD -  Recent serum creatinine = 1.88 - improved Patient is seeing Dr Prescilla with [facility].    Lisinopril  was stopped 03/23/2023 by Dr Prescilla.   Hypertension / Afib:  Current therapy: Eliquis  5mg  twice a day SVT Ablation completed 05/04/2023  Anticoagulation therapy: Eliquis  5mg  twice a day (Age = 68 yo; Scr = 1.88; weight = 70 kg)  BP Readings from Last 3 Encounters:  06/05/24 130/78  06/03/24 134/81  05/21/24 128/86     Objective:  Lab Results  Component Value Date   HGBA1C 5.0 05/01/2024    Lab Results  Component Value Date    CREATININE 1.88 (H) 06/03/2024   BUN 17 06/03/2024   NA 140 06/03/2024   K 3.8 06/03/2024   CL 109 06/03/2024   CO2 20 (L) 06/03/2024    Lab Results  Component Value Date   CHOL 131 05/01/2024   HDL 51.80 05/01/2024   LDLCALC 64 05/01/2024   TRIG 77.0 05/01/2024   CHOLHDL 3 05/01/2024    Medications Reviewed Today     Reviewed by Carla Milling, RPH-CPP (Pharmacist) on 06/20/24 at 1028  Med List Status: <None>   Medication Order Taking? Sig Documenting Provider Last Dose Status Informant  acetaminophen  (TYLENOL ) 500 MG tablet [id number] Yes Take 1,000 mg by mouth every 6 (six) hours as needed for mild pain. [provider]  Active Self  atorvastatin  (LIPITOR ) 10 MG tablet [id number] Yes Take 1 tablet (10 mg total) by mouth daily. Frann Mabel Mt, DO  Active   Baclofen  5 MG TABS [id number] Yes TAKE ONE TABLET BY MOUTH TWICE A DAY AS NEEDED Skeet Juliene SAUNDERS, DO  Active   blood glucose meter kit and supplies [id number]  One Touch Ultra  Check blood sugars once daily Dx: E11.9 Frann Mabel Mt, DO  Active Self  botulinum toxin Type A  (BOTOX ) 200 units injection [id number]  Inject 155 units IM into multiple site in the face,neck and head once every 90 days Skeet Juliene SAUNDERS, DO  Active Self  Med Note (SATTERFIELD, DARIUS E   Thu Jan 26, 2024  4:15 PM) Next dose June 12th per patient   budesonide -formoterol  (SYMBICORT ) 80-4.5 MCG/ACT inhaler [id number] Yes Inhale 2 puffs into the lungs 2 (two) times daily. Frann Mabel Mt, DO  Active   buPROPion  (WELLBUTRIN  XL) 150 MG 24 hr tablet [id number] Yes Take 1 tablet (150 mg total) by mouth daily. Frann Mabel Mt, DO  Active   Cholecalciferol (D 5000) 125 MCG (5000 UT) capsule [id number]  Take 5,000 Units by mouth daily. [provider]  Active   Continuous Glucose Sensor (DEXCOM G7 SENSOR) MISC [id number] Yes APPLY ONE SENSOR TO THE BACK OF YOUR UPPER ARM. REPLACE EVERY 10 DAYS AS DIRECTED Frann Mabel Mt, DO  Active   Cyanocobalamin  5000 MCG TBDP [id number] Yes Take 5,000 mcg by mouth. [provider]  Active   ELIQUIS  5 MG TABS tablet [id number] Yes TAKE 1 TABLET TWICE A DAY Frann Mabel Mt, DO  Active   EPINEPHRINE  0.3 mg/0.3 mL IJ SOAJ injection [id number]  INJECT ONE PEN INTO THE THIGH OR AS DIRECTED. AFTER ADMINISTRATION CALL 911. IF ANAPHYLACTIC SYMPTOMS PERSIST AFTER FIRST DOSE, MAY REPEAT DOSE IN 5 TO 15 MINUTES. Frann Mabel Mt, DO  Active   escitalopram  (LEXAPRO ) 20 MG tablet [id number] Yes Take 1 tablet (20 mg total) by mouth daily. Teresa Redell LABOR, NP  Active   ezetimibe  (ZETIA ) 10 MG tablet [id number] Yes TAKE 1 TABLET DAILY Frann Mabel Mt, DO  Active Self  fluticasone  (FLONASE ) 50 MCG/ACT nasal spray [id number]  Place 2 sprays into both nostrils daily. Frann Mabel Mt, DO  Active   Fremanezumab -vfrm (AJOVY ) 225 MG/1.5ML SOAJ [id number]  INJECT 225 MG UNDER THE SKIN EVERY 30 DAYS Skeet Juliene SAUNDERS, DO  Active   levocetirizine (XYZAL ) 5 MG tablet [id number] Yes Take 1 tablet (5 mg total) by mouth every evening. Frann Mabel Mt, DO  Active   LORazepam  (ATIVAN ) 0.5 MG tablet [id number] Yes TAKE ONE TABLET BY MOUTH EVERY 8 HOURS White, Brian A, NP  Active   Lurasidone  HCl 120 MG TABS [id number] Yes Take 1 tablet (120 mg total) by mouth at bedtime. Teresa Redell LABOR, NP  Active   magnesium  oxide (MAG-OX) 400 (240 Mg) MG tablet [id number] Yes Take 400 mg by mouth daily. [provider]  Active Self  Multiple Vitamins-Minerals (MULTIVITAMIN WITH MINERALS) tablet [id number] Yes Take 1 tablet by mouth daily. Daily Diabetes Health Pack - MVI + chromium, fish oil + vit D, magnesium , vit C, alpha lipoic acid w/ green tea [provider]  Active Self  nitroGLYCERIN  (NITROSTAT ) 0.4 MG SL tablet [id number] Yes Place 1 tablet (0.4 mg total) under the tongue every 5 (five) minutes as needed for chest pain. Krasowski, Robert J, MD  Active Self            Med Note TATIANA JESUSA JINNY Pablo Jan 23, 2024  1:26 PM) Haven't needed it   Patient not taking:   Discontinued 06/20/24 1028 (Duplicate) ondansetron  (ZOFRAN -ODT) 4 MG disintegrating tablet [id number] Yes PLACE ONE TABLET ON THE TONGUE EVERY 8 HOURS AS NEEDED FOR NAUSEA AND/OR VOMITING Frann Mabel Mt, DO  Active   [facility] VERIO test strip [id number] Yes USE DAILY TO CHECK BLOOD SUGAR Frann Mabel Mt, DO  Active Self  pantoprazole  (PROTONIX ) 40 MG tablet [id number] Yes TAKE 1 TABLET DAILY Frann Mabel Mt, DO  Active Self  QUEtiapine  (SEROQUEL ) 300 MG tablet [id number] Yes Take 1 tablet (300 mg total) by  mouth at bedtime. Teresa Redell LABOR, NP  Active   Rimegepant Sulfate  (NURTEC) 75 MG TBDP [id number] Yes Take 1 tablet (75 mg total) by mouth as needed (take 1 tab at the earlist onset of a migraine. Max 1 tab in 24 hours). Skeet Juliene SAUNDERS, DO  Active   tirzepatide  (MOUNJARO ) 5 MG/0.5ML Pen [id number] Yes Inject 5 mg into the skin once a week. Frann Mabel Mt, DO  Active   topiramate  (TOPAMAX ) 50 MG tablet [id number] Yes TAKE 3 TABLETS TWICE A DAY Wendling, Mabel Mt, DO  Active Self              Assessment/Plan:  Type 2 DM with CKD: blood glucose and weight have improved significantly starting Mounjaro  - Continue Mounjaro  to 5mg  weekly. .  - Continue Farxiga  10mg  daily.   - Continue to check blood glucose once a day  Hypertension / Afib/ SVT:  Continue Eliquis  5mg  twice a day - reviewed dose - no dose adjustment needed at this time.  Medication Management / Access:  Patient has qualified for Medicare Extra Help for 2025. Notes that medication are affordable.  Reviewed refill history and updated any needed Rx's Reviewed a few plans that will be available to her in [place]. Encouraged patient to try to fill all medications for 90 day supply before she leaves so that she has a little time to get settle and find a new PCP and specialists.  [employer] with information about medicare.gov site, link to LIS / Extra Help site and also info on SHIIP in [place].    Follow Up Plan: none  Madelin Ray, PharmD Clinical Pharmacist [facility]

## 2024-06-21 ENCOUNTER — Ambulatory Visit: Payer: Medicare (Managed Care) | Admitting: Behavioral Health

## 2024-06-21 DIAGNOSIS — F411 Generalized anxiety disorder: Secondary | ICD-10-CM | POA: Diagnosis not present

## 2024-06-21 DIAGNOSIS — F331 Major depressive disorder, recurrent, moderate: Secondary | ICD-10-CM

## 2024-06-21 NOTE — Progress Notes (Addendum)
 [facility] Counselor/Therapist Progress Note  Patient ID: Paula Stout, MRN: [id number],    Date: 06/21/2024 Time Spent: 1 PM until 1:56 PM, 56 minutes. The patient consented to the video teletherapy and was located in her home during this session. She is aware it is the responsibility of the patient to secure confidentiality on her end of the session. The provider was in a private home  for the duration of this session.      Treatment Type: Individual Therapy  Reported Symptoms: Anxiety, depression  Mental Status Exam: Appearance:  Casual     Behavior: Appropriate  Motor: Normal  Speech/Language:  Normal Rate  Affect: Appropriate  Mood: normal  Thought process: normal  Thought content:   WNL  Sensory/Perceptual disturbances:   WNL  Orientation: oriented to person, place, time/date, situation, day of week, month of year, and year  Attention: Good  Concentration: Good  Memory: WNL  Fund of knowledge:  Good  Insight:   Good  Judgment:  Good  Impulse Control: Good   Risk Assessment: Danger to Self:  No Self-injurious Behavior: No Danger to Others: No Duty to Warn:no Physical Aggression / Violence:No  Access to Firearms a concern: No  Gang Involvement:No   Subjective: The patient has taken a couple of steps closer to the move.  She booked a flight to [facility] [place]  on November 19.  She found out that she can fly out of [place] with her dog so she committed to that flight.  She has committed to a carrier who will hold her call her to [place]  and they will pick that up on November 15.  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  It is hard to find motivation because she looks in her room and feels overwhelmed.  She is getting very little cooperation from her son and daughter-in-law.  Even though they want she had her dog out of the house the only thing we have done is to help move  to the containers that she has gotten filled out to the garage.  There will let anyone come in the house to help her pack things up.  The daughter is not offering to help back anything up so I asked her to speak to the daughter about committed to 1 hour/week over the next few weeks while her bigger dogs downstairs are created and they can work easier.  In addition to that I asked her to try and compartmentalize to work about 30 minutes at a time and reward herself with playing on her phone as an escape so she can get a little bit done gradually.  For the most part medically she has felt pretty well and will meet with a cardiologist soon.  She does contract for safety having no thoughts of hurting herself or anyone else. Diagnosis: Generalized anxiety disorder, major depressive disorder, moderate, recurrent Plan: I will meet with the patient weekly via care agility.  Treatment plan: We will use cognitive behavioral therapy as well as person centered and supportive therapy in addition to elements of dialectical behavior therapy to help reduce the patient's anxiety and depression by at least 50% with a target date of October 06, 2023.  Goals for improving depression may include having less sadness as indicated by patient report and scores on the PHQ-9, have improved mood and return to a healthier level of functioning, identify causes including  environment for depressed mood and learn ways to cope with depression especially those connected to her medical issues.  Interventions include using cognitive behavioral therapy to explore and replace thoughts and behaviors.  We will look at how depression is experienced in day-to-day living and encouraged sharing of feelings.  We will encourage the use of coping skills for management of depressive symptoms.  Goals for reducing anxiety are to improve her ability to manage anxiety symptoms, better handle stress, identify causes for anxiety and explore ways to lower it,  resolve the core conflicts contributing to anxiety as well as manage thoughts and worrisome thinking contributing to feelings of anxiety.  Interventions will include providing education about anxiety, facilitate problem solution skills to help her identify options for resolving stress, teach coping skills for managing anxiety as well as mindfulness and communication in terms of family to reduce her stress level, use cognitive behavior therapy to identify and change anxiety provoking thought and behavior patterns as well as teach distress tolerance and mindfulness skills for alleviating anxiety. Progress: 40% progress notes were reviewed with a new extended target date of  January 31st, 2026.  Lorrene CHRISTELLA Hasten, [facility]                                         Lorrene CHRISTELLA Hasten, [facility]               Lorrene CHRISTELLA Hasten, [facility]               Lorrene CHRISTELLA Hasten, [facility]               Lorrene CHRISTELLA Hasten, [facility]               Lorrene CHRISTELLA Hasten, [facility]               Lorrene CHRISTELLA Hasten, [facility]               Lorrene CHRISTELLA Hasten, [facility]               Lorrene CHRISTELLA Hasten, [facility]               Lorrene CHRISTELLA Hasten, [facility]               Lorrene CHRISTELLA Hasten, [facility]               Lorrene CHRISTELLA Hasten, [facility]               Lorrene CHRISTELLA Hasten, [facility]               Lorrene CHRISTELLA Hasten, [facility]               Lorrene CHRISTELLA Hasten, [facility]               Lorrene CHRISTELLA Hasten, [facility]               Lorrene CHRISTELLA Hasten, [facility]               Lorrene CHRISTELLA Hasten, [facility]               Lorrene CHRISTELLA Hasten, [facility]

## 2024-06-22 ENCOUNTER — Encounter: Payer: Self-pay | Admitting: Family Medicine

## 2024-06-22 ENCOUNTER — Other Ambulatory Visit: Payer: Self-pay | Admitting: Family Medicine

## 2024-06-22 MED ORDER — MIDODRINE HCL 10 MG PO TABS: 10.0000 mg | ORAL_TABLET | Freq: Three times a day (TID) | ORAL | 0 refills | Status: DC

## 2024-06-27 ENCOUNTER — Telehealth: Payer: Self-pay | Admitting: Neurology

## 2024-06-27 NOTE — Telephone Encounter (Signed)
 Tried calling Paula Stout lvm needed to see if they faxed paperwork for us  to sign.

## 2024-06-27 NOTE — Telephone Encounter (Signed)
 Copied from CRM #[id number]. Topic: General - Other >> Jun 27, 2024  3:48 PM Thersia BROCKS wrote: Reason for CRM: makayla called in stated needs referral [facility]) [address], [place] [phone] attn:   open t-f   [phone]

## 2024-06-29 ENCOUNTER — Ambulatory Visit: Payer: Medicare (Managed Care) | Admitting: Behavioral Health

## 2024-06-29 ENCOUNTER — Encounter: Payer: Self-pay | Admitting: Behavioral Health

## 2024-06-29 DIAGNOSIS — F411 Generalized anxiety disorder: Secondary | ICD-10-CM

## 2024-06-29 DIAGNOSIS — F331 Major depressive disorder, recurrent, moderate: Secondary | ICD-10-CM | POA: Diagnosis not present

## 2024-06-29 NOTE — Progress Notes (Signed)
 [facility] Counselor/Therapist Progress Note  Patient ID: Paula Stout, MRN: [id number],    Date: 06/29/2024 Time Spent: 11:01 AM until 11:58 AM, 57 minutes. The patient consented to the video teletherapy and was located in her home during this session. She is aware it is the responsibility of the patient to secure confidentiality on her end of the session. The provider was in a private home  for the duration of this session.      Treatment Type: Individual Therapy  Reported Symptoms: Anxiety, depression  Mental Status Exam: Appearance:  Casual     Behavior: Appropriate  Motor: Normal  Speech/Language:  Normal Rate  Affect: Appropriate  Mood: normal  Thought process: normal  Thought content:   WNL  Sensory/Perceptual disturbances:   WNL  Orientation: oriented to person, place, time/date, situation, day of week, month of year, and year  Attention: Good  Concentration: Good  Memory: WNL  Fund of knowledge:  Good  Insight:   Good  Judgment:  Good  Impulse Control: Good   Risk Assessment: Danger to Self:  No Self-injurious Behavior: No Danger to Others: No Duty to Warn:no Physical Aggression / Violence:No  Access to Firearms a concern: No  Gang Involvement:No   Subjective: The patient is meeting with her primary care physician next week because she feels that she either must have too many medications with her not working well with each other.  She has been waking up feeling fuzzy and little unstable physically she fell 1 day this week in the bathroom and thankfully was not hurt but that concerns her.  She has been so busy with the work she is doing for her son that she has not had a lot of time to pack up and her family is not helping her any.  I encouraged her to take a little bit it is knowing that she is leaving on 19 November.  She said it has just gotten worse in the house and her daughter recently asked her to go upstairs so her daughter's husband could  come downstairs and watch TV.  He did take a mental health break when in the middle of the week going to get her nails done and run him that her errands just to get out of the house which she is thankful that she did.  Encouraged her to keep continue to do that as much as she can as long as she is living here. She does contract for safety having no thoughts of hurting herself or anyone else. Diagnosis: Generalized anxiety disorder, major depressive disorder, moderate, recurrent Plan: I will meet with the patient weekly via care agility.  Treatment plan: We will use cognitive behavioral therapy as well as person centered and supportive therapy in addition to elements of dialectical behavior therapy to help reduce the patient's anxiety and depression by at least 50% with a target date of October 06, 2023.  Goals for improving depression may include having less sadness as indicated by patient report and scores on the PHQ-9, have improved mood and return to a healthier level of functioning, identify causes including environment for depressed mood and learn ways to cope with depression especially those connected to her medical issues.  Interventions include using cognitive behavioral therapy to explore and replace thoughts and behaviors.  We will look at how depression is experienced in day-to-day living and encouraged sharing of feelings.  We will encourage the use of coping skills for management of depressive symptoms.  Goals for reducing  anxiety are to improve her ability to manage anxiety symptoms, better handle stress, identify causes for anxiety and explore ways to lower it, resolve the core conflicts contributing to anxiety as well as manage thoughts and worrisome thinking contributing to feelings of anxiety.  Interventions will include providing education about anxiety, facilitate problem solution skills to help her identify options for resolving stress, teach coping skills for managing anxiety as well as  mindfulness and communication in terms of family to reduce her stress level, use cognitive behavior therapy to identify and change anxiety provoking thought and behavior patterns as well as teach distress tolerance and mindfulness skills for alleviating anxiety. Progress: 40% progress notes were reviewed with a new extended target date of  January 31st, 2026.  Paula Stout, [facility]                                         Paula Stout, [facility]               Paula Stout, [facility]               Paula Stout, [facility]               Paula Stout, [facility]               Paula Stout, [facility]               Paula Stout, [facility]               Paula Stout, [facility]               Paula Stout, [facility]               Paula Stout, [facility]               Paula Stout, [facility]               Paula Stout, [facility]               Paula Stout, [facility]               Paula Stout, [facility]               Paula Stout, [facility]               Paula Stout, [facility]               Paula Stout, [facility]               Paula Stout, [facility]               Paula Stout, [facility]               Paula Stout, [facility]

## 2024-07-01 ENCOUNTER — Other Ambulatory Visit: Payer: Self-pay | Admitting: Neurology

## 2024-07-02 ENCOUNTER — Encounter: Payer: Self-pay | Admitting: Family Medicine

## 2024-07-02 ENCOUNTER — Encounter: Payer: Self-pay | Admitting: Behavioral Health

## 2024-07-02 ENCOUNTER — Encounter: Payer: Self-pay | Admitting: *Deleted

## 2024-07-02 ENCOUNTER — Ambulatory Visit: Payer: Medicare (Managed Care) | Admitting: Family Medicine

## 2024-07-02 VITALS — BP 128/80 | HR 72 | Temp 98.0°F | Resp 16 | Ht 66.0 in | Wt 164.2 lb

## 2024-07-02 DIAGNOSIS — E1165 Type 2 diabetes mellitus with hyperglycemia: Secondary | ICD-10-CM

## 2024-07-02 DIAGNOSIS — E782 Mixed hyperlipidemia: Secondary | ICD-10-CM

## 2024-07-02 DIAGNOSIS — E088 Diabetes mellitus due to underlying condition with unspecified complications: Secondary | ICD-10-CM | POA: Diagnosis not present

## 2024-07-02 DIAGNOSIS — G43719 Chronic migraine without aura, intractable, without status migrainosus: Secondary | ICD-10-CM | POA: Diagnosis not present

## 2024-07-02 DIAGNOSIS — G43709 Chronic migraine without aura, not intractable, without status migrainosus: Secondary | ICD-10-CM

## 2024-07-02 DIAGNOSIS — I959 Hypotension, unspecified: Secondary | ICD-10-CM

## 2024-07-02 DIAGNOSIS — Z23 Encounter for immunization: Secondary | ICD-10-CM | POA: Diagnosis not present

## 2024-07-02 MED ORDER — MIDODRINE HCL 5 MG PO TABS: 5.0000 mg | ORAL_TABLET | Freq: Three times a day (TID) | ORAL | 0 refills | Status: DC

## 2024-07-02 MED ORDER — NURTEC 75 MG PO TBDP: 75.0000 mg | ORAL_TABLET | ORAL | 5 refills | Status: AC | PRN

## 2024-07-02 NOTE — Progress Notes (Signed)
 Chief Complaint  Patient presents with   Medication Refill    Medication Refill    Subjective: Patient is a 68 y.o. female here for a final med review.  Patient has a history of diabetes.  She is currently taking Mounjaro  5 mg weekly.  She was taking higher dosages but was having hypoglycemic episodes.  She normally has them in the morning.  She checks her sugar and it is in the 60s.  She feels confused and lightheaded.  When she drinks some juice or eat some candy, her symptoms resolved.  She would like to go up on her Mounjaro .  Patient has a history of migraines.  She was getting Botox  injections.  She is on Topamax  for prophylaxis.  She uses Zofran  and Nurtec for abortive therapy.  Her neurologist would not refill her Nurtec so she is hoping we will do it.  She moves to [place]  soon and would like a refill before she leaves.  The patient has a history of mixed hyperlipidemia.  She was taking Lipitor  10 mg daily in addition to Zetia  10 mg daily.  Last cholesterol was excellent.  She would like to stop the Zetia .  She sees her cardiologist in 3 days.  She has a history of orthostasis.  She is currently taking midodrine  10 mg 3 times daily.  Compliant, no adverse effects.  Her blood pressures have been rising to the 130s systolic over 70s diastolic.  This is high for her.  Past Medical History:  Diagnosis Date   Adhesive capsulitis of left shoulder 12/16/2016   Asthma without status asthmaticus 02/06/2021   At risk for falls 02/04/2019   Atrial fibrillation (HCC) 09/01/2014   Last Assessment & Plan:  Formatting of this note might be different from the original. A:  Chronic.  Sinus rhythm at this time.  States she was told this during admission at [facility] in the past. P:  On baby aspirin  at home will continue.  Low dose metoprolol  started.   Bipolar 1 disorder, depressed, moderate (HCC) 02/18/2020   Capsulitis of left shoulder 10/20/2021   Chronic anticoagulation 02/20/2020   Chronic  atrial fibrillation (HCC) 02/18/2020   Chronic headache 10/11/2013   Chronic interstitial cystitis 12/28/2005   Chronic migraine without aura, intractable, without status migrainosus 06/24/2017   Colon cancer screening 04/15/2016   Last Assessment & Plan:  Formatting of this note might be different from the original. Will schedule for colonoscopy   Depression, recurrent 02/18/2020   Diabetes mellitus (HCC)    Dyslipidemia 02/20/2020   GAD (generalized anxiety disorder) 02/18/2020   GERD (gastroesophageal reflux disease) 11/05/2021   H/O chest pain 07/31/2021   History of atrial fibrillation 06/24/2017   History of posttraumatic stress disorder (PTSD) 10/07/2016   Hyperosmolarity due to secondary diabetes mellitus (HCC) 02/06/2021   Impaired mobility and activities of daily living 11/14/2015   Irritable bowel syndrome (IBS)    Junctional tachycardia 11/05/2021   Kidney stone 02/06/2021   Migraine without aura, not refractory 02/06/2021   Mixed hyperlipidemia 04/06/2021   Last Assessment & Plan:  Formatting of this note might be different from the original. 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 No accessory muscle use Psych: Age appropriate judgment and insight, normal affect and mood  Assessment and Plan: Chronic migraine without aura, intractable, without status migrainosus  Chronic migraine without aura without status migrainosus, not intractable - Plan: Rimegepant Sulfate  (NURTEC) 75 MG TBDP  Type 2 diabetes mellitus with hyperglycemia, without long-term current use of insulin  (HCC)  Hypotension, unspecified hypotension type  Mixed hyperlipidemia  Need for influenza vaccination - Plan: Flu vaccine trivalent PF, 6mos and older(Flulaval,Afluria,Fluarix,Fluzone)  Will refill Nurtec on behalf of the neurology team. As above. I do not want to increase her Mounjaro .  She looks to be healthy with her weight.  Sugars have been well-controlled.  I do not want her to drop any further with her hypoglycemic episodes.  She will eat routinely. Decrease midodrine  from 10 mg 3 times daily to 5 mg 3 times daily.  Monitor blood pressure at home.  This will probably have to be followed up in [place]  as she leaves in less than a month. Stop Zetia , continue Lipitor  10 mg daily. Flu shot today. The patient voiced understanding and agreement to the plan.  Mabel [place], DO 07/03/24  4:44 PM

## 2024-07-02 NOTE — Patient Instructions (Addendum)
 Cut down to 5 mg 2-3 times daily of the midodrine . Refills have been sent.   Let me know if there are issues with the Nurtec.   We are done with the Zetia .  Let us  know if you need anything.

## 2024-07-05 ENCOUNTER — Ambulatory Visit: Payer: Medicare (Managed Care) | Attending: Cardiology | Admitting: Cardiology

## 2024-07-05 ENCOUNTER — Telehealth (HOSPITAL_COMMUNITY): Payer: Self-pay | Admitting: *Deleted

## 2024-07-05 ENCOUNTER — Encounter: Payer: Self-pay | Admitting: Cardiology

## 2024-07-05 ENCOUNTER — Other Ambulatory Visit: Payer: Self-pay | Admitting: Medical Genetics

## 2024-07-05 VITALS — BP 86/60 | HR 60 | Ht 66.0 in | Wt 164.0 lb

## 2024-07-05 DIAGNOSIS — R0609 Other forms of dyspnea: Secondary | ICD-10-CM | POA: Diagnosis not present

## 2024-07-05 DIAGNOSIS — E041 Nontoxic single thyroid nodule: Secondary | ICD-10-CM | POA: Diagnosis not present

## 2024-07-05 DIAGNOSIS — I10 Essential (primary) hypertension: Secondary | ICD-10-CM

## 2024-07-05 DIAGNOSIS — Z006 Encounter for examination for normal comparison and control in clinical research program: Secondary | ICD-10-CM

## 2024-07-05 DIAGNOSIS — I471 Supraventricular tachycardia, unspecified: Secondary | ICD-10-CM | POA: Diagnosis not present

## 2024-07-05 DIAGNOSIS — E088 Diabetes mellitus due to underlying condition with unspecified complications: Secondary | ICD-10-CM | POA: Diagnosis not present

## 2024-07-05 DIAGNOSIS — I480 Paroxysmal atrial fibrillation: Secondary | ICD-10-CM

## 2024-07-05 DIAGNOSIS — Z7901 Long term (current) use of anticoagulants: Secondary | ICD-10-CM | POA: Diagnosis not present

## 2024-07-05 DIAGNOSIS — E040 Nontoxic diffuse goiter: Secondary | ICD-10-CM | POA: Diagnosis not present

## 2024-07-05 NOTE — Patient Instructions (Signed)
Medication Instructions:  ?Your physician recommends that you continue on your current medications as directed. Please refer to the Current Medication list given to you today.  ?*If you need a refill on your cardiac medications before your next appointment, please call your pharmacy* ? ? ?Lab Work: ?None Ordered ?If you have labs (blood work) drawn today and your tests are completely normal, you will receive your results only by: ?MyChart Message (if you have MyChart) OR ?A paper copy in the mail ?If you have any lab test that is abnormal or we need to change your treatment, we will call you to review the results. ? ? ?Testing/Procedures: ?Your physician has requested that you have a lexiscan myoview. For further information please visit [email or web address]. Please follow instruction sheet, as given. ? ?The test will take approximately 3 to 4 hours to complete; you may bring reading material.  If someone comes with you to your appointment, they will need to remain in the main lobby due to limited space in the testing area. ? ?How to prepare for your Myocardial Perfusion Test: ?Do not eat or drink 3 hours prior to your test, except you may have water. ?Do not consume products containing caffeine (regular or decaffeinated) 12 hours prior to your test. (ex: coffee, chocolate, sodas, tea). ?Do bring a list of your current medications with you.  If not listed below, you may take your medications as normal. ?Do wear comfortable clothes (no dresses or overalls) and walking shoes, tennis shoes preferred (No heels or open toe shoes are allowed). ?Do NOT wear cologne, perfume, aftershave, or lotions (deodorant is allowed). ?If these instructions are not followed, your test will have to be rescheduled.   ? ? ?Follow-Up: ?At [facility], you and your health needs are our priority.  As part of our continuing mission to provide you with exceptional heart care, we have created designated Provider Care Teams.  These Care Teams  include your primary Cardiologist (physician) and Advanced Practice Providers (APPs -  Physician Assistants and Nurse Practitioners) who all work together to provide you with the care you need, when you need it. ? ?We recommend signing up for the patient portal called "MyChart".  Sign up information is provided on this After Visit Summary.  MyChart is used to connect with patients for Virtual Visits (Telemedicine).  Patients are able to view lab/test results, encounter notes, upcoming appointments, etc.  Non-urgent messages can be sent to your provider as well.   ?To learn more about what you can do with MyChart, go to [email or web address].   ? ?Your next appointment:   ?3 month(s) ? ?The format for your next appointment:   ?In Person ? ?Provider:   ?Jenne Campus, MD  ? ? ?Other Instructions ?NA  ?

## 2024-07-05 NOTE — Progress Notes (Signed)
 Cardiology Office Note:    Date:  07/05/2024   ID:  Paula Stout, DOB 25-Feb-1956, MRN [id number]  PCP:  Frann Mabel Mt, DO  Cardiologist:  Lamar Fitch, MD    Referring MD: Frann Mabel Mt*   No chief complaint on file.   History of Present Illness:    Paula Stout is a 68 y.o. female past medical history significant for paroxysmal atrial fibrillation, type 2 diabetes, supraventricular tachycardia status post SVT ablation, dyslipidemia, bipolar disorder, orthostatic hypotension, she came today to my office of regular follow-up.  She is seems very happy.  She informing today that she is moving to [place]  state and that happened next month however she ended up being in the emergency room in September and the reason for the visit was chest pain.  She described chest pain on and off different situation usually at rest.  Exercises does not bring it.  Pain last between few seconds to few minutes there is [place] shortness of breath associated with this sensation we did stress test in 2023 which was negative acute.  I was thinking about potentially doing coronary CT angio but because of her kidney dysfunction and her stress test will be probably the best option.  Sadly because of her low blood pressure I have limited options terms of medications I can use  Past Medical History:  Diagnosis Date   Adhesive capsulitis of left shoulder 12/16/2016   Asthma without status asthmaticus 02/06/2021   At risk for falls 02/04/2019   Atrial fibrillation (HCC) 09/01/2014   Last Assessment & Plan:  Formatting of this note might be different from the original. A:  Chronic.  Sinus rhythm at this time.  States she was told this during admission at [facility] in the past. P:  On baby aspirin  at home will continue.  Low dose metoprolol  started.   Bipolar 1 disorder, depressed, moderate (HCC) 02/18/2020   Capsulitis of left shoulder 10/20/2021   Chronic anticoagulation 02/20/2020    Chronic atrial fibrillation (HCC) 02/18/2020   Chronic headache 10/11/2013   Chronic interstitial cystitis 12/28/2005   Chronic migraine without aura, intractable, without status migrainosus 06/24/2017   Colon cancer screening 04/15/2016   Last Assessment & Plan:  Formatting of this note might be different from the original. Will schedule for colonoscopy   Depression, recurrent 02/18/2020   Diabetes mellitus (HCC)    Dyslipidemia 02/20/2020   GAD (generalized anxiety disorder) 02/18/2020   GERD (gastroesophageal reflux disease) 11/05/2021   H/O chest pain 07/31/2021   History of atrial fibrillation 06/24/2017   History of posttraumatic stress disorder (PTSD) 10/07/2016   Hyperosmolarity due to secondary diabetes mellitus (HCC) 02/06/2021   Impaired mobility and activities of daily living 11/14/2015   Irritable bowel syndrome (IBS)    Junctional tachycardia 11/05/2021   Kidney stone 02/06/2021   Migraine without aura, not refractory 02/06/2021   Mixed hyperlipidemia 04/06/2021   Last Assessment & Plan:  Formatting of this note might be different from the original. 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 Controlled   Refractory migraine with aura 02/06/2021   Refractory migraine without aura 02/06/2021   Renal colic on left side 04/22/2017   Stage 3 chronic kidney  disease (HCC) 02/20/2020   Thyroid  goiter    10/17/23 CT: right thyorid goiter with substernal extension, benign FNA 07/07/23   Unspecified adverse effect of other drug, medicinal and biological substance(995.29) 02/17/2006   Urinary tract obstruction 02/06/2021    Past Surgical History:  Procedure  Laterality Date   ABDOMINAL HYSTERECTOMY     Fibroids   ATRIAL FIBRILLATION ABLATION     ATRIAL FIBRILLATION ABLATION N/A 10/27/2022   Procedure: ATRIAL FIBRILLATION ABLATION;  Surgeon: Inocencio Soyla Lunger, MD;  Location: [facility] INVASIVE CV LAB;  Service: Cardiovascular;  Laterality: N/A;   BRONCHIAL BIOPSY  11/08/2023   Procedure: BRONCHOSCOPY, WITH BIOPSY;  Surgeon: Shelah Lamar RAMAN, MD;  Location: [facility] ENDOSCOPY;  Service: Pulmonary;;   BRONCHIAL BRUSHINGS  11/08/2023   Procedure: BRONCHOSCOPY, WITH BRUSH BIOPSY;  Surgeon: Shelah Lamar RAMAN, MD;  Location: [facility] ENDOSCOPY;  Service: Pulmonary;;   BRONCHIAL NEEDLE ASPIRATION BIOPSY  11/08/2023   Procedure: BRONCHOSCOPY, WITH NEEDLE ASPIRATION BIOPSY;  Surgeon: Shelah Lamar RAMAN, MD;  Location: [facility] ENDOSCOPY;  Service: Pulmonary;;   BRONCHIAL WASHINGS  11/08/2023   Procedure: IRRIGATION, BRONCHUS;  Surgeon: Shelah Lamar RAMAN, MD;  Location: [facility] ENDOSCOPY;  Service: Pulmonary;;   CARDIAC CATHETERIZATION     CARDIOVERSION     CESAREAN SECTION     KNEE SURGERY     LEFT HEART CATH AND CORONARY ANGIOGRAPHY N/A 03/04/2020   Procedure: LEFT HEART CATH AND CORONARY ANGIOGRAPHY;  Surgeon: Claudene Victory ORN, MD;  Location: [facility];  Service: Cardiovascular;  Laterality: N/A;   ROTATOR CUFF REPAIR     SVT ABLATION N/A 05/04/2023   Procedure: SVT ABLATION;  Surgeon: Inocencio Soyla Lunger, MD;  Location: [facility];  Service: Cardiovascular;  Laterality: N/A;    Current Medications: Current Meds  Medication Sig   acetaminophen  (TYLENOL ) 500 MG tablet Take 1,000 mg by mouth every 6 (six) hours as needed for mild pain.   atorvastatin  (LIPITOR ) 10 MG tablet Take 1 tablet (10 mg total) by mouth daily.   Baclofen  5 MG TABS TAKE ONE TABLET BY MOUTH TWICE A DAY AS NEEDED   budesonide -formoterol  (SYMBICORT ) 80-4.5 MCG/ACT inhaler Inhale 2 puffs into the lungs 2 (two) times daily.   buPROPion  (WELLBUTRIN  XL) 150 MG 24 hr tablet Take 1 tablet (150 mg total) by mouth  daily.   Cholecalciferol (D 5000) 125 MCG (5000 UT) capsule Take 5,000 Units by mouth daily.   Continuous Glucose Sensor (DEXCOM G7 SENSOR) MISC APPLY ONE SENSOR TO THE BACK OF YOUR UPPER ARM. REPLACE EVERY 10 DAYS AS DIRECTED   Cyanocobalamin  5000 MCG TBDP Take 5,000 mcg by mouth.   ELIQUIS  5 MG TABS tablet TAKE 1 TABLET TWICE A DAY   EPINEPHRINE  0.3 mg/0.3 mL IJ SOAJ injection INJECT ONE PEN INTO THE THIGH OR AS DIRECTED. AFTER ADMINISTRATION CALL 911. IF ANAPHYLACTIC SYMPTOMS PERSIST AFTER FIRST DOSE, MAY REPEAT DOSE IN 5 TO 15 MINUTES.   escitalopram  (LEXAPRO ) 20 MG tablet Take 1 tablet (20 mg total) by mouth daily.   fluticasone  (FLONASE ) 50 MCG/ACT nasal spray Place 2 sprays into both nostrils daily.   Fremanezumab -vfrm (AJOVY ) 225 MG/1.5ML SOAJ INJECT 225 MG UNDER THE SKIN EVERY 30 DAYS   levocetirizine (XYZAL ) 5 MG tablet Take 1 tablet (5 mg total) by mouth every evening.   LORazepam  (ATIVAN ) 0.5 MG tablet TAKE ONE TABLET BY MOUTH EVERY 8 HOURS   Lurasidone  HCl 120 MG TABS Take 1 tablet (120 mg total) by mouth at bedtime.   magnesium  oxide (  MAG-OX) 400 (240 Mg) MG tablet Take 400 mg by mouth daily.   midodrine  (PROAMATINE ) 5 MG tablet Take 1 tablet (5 mg total) by mouth 3 (three) times daily with meals.   Multiple Vitamins-Minerals (MULTIVITAMIN WITH MINERALS) tablet Take 1 tablet by mouth daily. Daily Diabetes Health Pack - MVI + chromium, fish oil + vit D, magnesium , vit C, alpha lipoic acid w/ green tea   nitroGLYCERIN  (NITROSTAT ) 0.4 MG SL tablet Place 1 tablet (0.4 mg total) under the tongue every 5 (five) minutes as needed for chest pain.   ondansetron  (ZOFRAN -ODT) 4 MG disintegrating tablet PLACE ONE TABLET ON THE TONGUE EVERY 8 HOURS AS NEEDED FOR NAUSEA AND/OR VOMITING   ONETOUCH VERIO test strip USE DAILY TO CHECK BLOOD SUGAR   QUEtiapine  (SEROQUEL ) 300 MG tablet Take 1 tablet (300 mg total) by mouth at bedtime.   Rimegepant Sulfate  (NURTEC) 75 MG TBDP Take 1 tablet (75 mg total)  by mouth as needed (take 1 tab at the earlist onset of a migraine. Max 1 tab in 24 hours).   tirzepatide  (MOUNJARO ) 5 MG/0.5ML Pen Inject 5 mg into the skin once a week.   topiramate  (TOPAMAX ) 50 MG tablet TAKE 3 TABLETS TWICE A DAY     Allergies:   Bee venom, Onion, Tetanus-diphtheria toxoids td, Sumatriptan , Zolpidem , Asa [aspirin ], Iodine, Nsaids, Sulfa antibiotics, and Vancomycin   Social History   Socioeconomic History   Marital status: Widowed    Spouse name: Not on file   Number of children: 6   Years of education: Not on file   Highest education level: Associate degree: academic program  Occupational History   Occupation: not employed  Tobacco Use   Smoking status: Never    Passive exposure: Past   Smokeless tobacco: Never   Tobacco comments:    Never smoke 07/15/22  Vaping Use   Vaping status: Never Used  Substance and Sexual Activity   Alcohol use: Never    Comment: light social   Drug use: Never   Sexual activity: Not Currently  Other Topics Concern   Not on file  Social History Narrative   Lives in [place] with daughter and son-in-law.  Sews clothing in free time as hobby.    Social Drivers of Corporate Investment Banker Strain: Low Risk  (07/01/2024)   Overall Financial Resource Strain (CARDIA)    Difficulty of Paying Living Expenses: Not hard at all  Food Insecurity: No Food Insecurity (07/01/2024)   Hunger Vital Sign    Worried About Running Out of Food in the Last Year: Never true    Ran Out of Food in the Last Year: Never true  Transportation Needs: Unmet Transportation Needs (07/01/2024)   PRAPARE - Administrator, Civil Service (Medical): Yes    Lack of Transportation (Non-Medical): No  Physical Activity: Inactive (07/01/2024)   Exercise Vital Sign    Days of Exercise per Week: 0 days    Minutes of Exercise per Session: Not on file  Stress: Stress Concern Present (07/01/2024)   [employer] of Occupational Health -  Occupational Stress Questionnaire    Feeling of Stress: Very much  Social Connections: Socially Isolated (07/01/2024)   Social Connection and Isolation Panel    Frequency of Communication with Friends and Family: More than three times a week    Frequency of Social Gatherings with Friends and Family: Never    Attends Religious Services: Never    Database Administrator or Organizations: No  Attends Banker Meetings: Not on file    Marital Status: Widowed     Family History: The patient's family history includes Bipolar disorder in her daughter and son; Migraines in her daughter, daughter, and son. She was adopted. ROS:   Please see the history of present illness.    All 14 point review of systems negative except as described per history of present illness  EKGs/Labs/Other Studies Reviewed:    EKG Interpretation Date/Time:  Thursday July 05 2024 08:17:59 EDT Ventricular Rate:  60 PR Interval:  160 QRS Duration:  88 QT Interval:  456 QTC Calculation: 456 R Axis:   -17  Text Interpretation: Normal sinus rhythm Normal ECG When compared with ECG of 03-Jun-2024 20:08, PREVIOUS ECG IS PRESENT Confirmed by Bernie Charleston [phone]) on 07/05/2024 8:43:38 AM    Recent Labs: 01/11/2024: TSH 0.85 02/05/2024: Magnesium  1.8 05/01/2024: ALT 12 06/03/2024: BUN 17; Creatinine, Ser 1.88; Hemoglobin 12.9; Platelets 211; Potassium 3.8; Pro Brain Natriuretic Peptide 182.0; Sodium 140  Recent Lipid Panel    Component Value Date/Time   CHOL 131 05/01/2024 1433   CHOL 141 02/10/2021 0914   TRIG 77.0 05/01/2024 1433   HDL 51.80 05/01/2024 1433   HDL 26 (L) 02/10/2021 0914   CHOLHDL 3 05/01/2024 1433   VLDL 15.4 05/01/2024 1433   LDLCALC 64 05/01/2024 1433   LDLCALC 86 02/10/2021 0914    Physical Exam:    VS:  BP (!) 86/60   Pulse 60   Ht 5' 6 (1.676 m)   Wt 164 lb (74.4 kg)   SpO2 99%   BMI 26.47 kg/m     Wt Readings from Last 3 Encounters:  07/05/24 164 lb (74.4 kg)   07/02/24 164 lb 3.2 oz (74.5 kg)  06/05/24 155 lb (70.3 kg)     GEN:  Well nourished, well developed in no acute distress HEENT: Normal NECK: No JVD; No carotid bruits LYMPHATICS: No lymphadenopathy CARDIAC: RRR, no murmurs, no rubs, no gallops RESPIRATORY:  Clear to auscultation without rales, wheezing or rhonchi  ABDOMEN: Soft, non-tender, non-distended MUSCULOSKELETAL:  No edema; No deformity  SKIN: Warm and dry LOWER EXTREMITIES: no swelling NEUROLOGIC:  Alert and oriented x 3 PSYCHIATRIC:  Normal affect   ASSESSMENT:    1. Paroxysmal supraventricular tachycardia   2. Paroxysmal atrial fibrillation (HCC)   3. Primary hypertension   4. Diabetes mellitus due to underlying condition with unspecified complications (HCC)   5. Chronic anticoagulation    PLAN:    In order of problems listed above:  Chest pain with some concerning characteristics, I will schedule her to have a stress test.  I told patient to go to the emergency room if she develop chest pain again. Supraventricular tachycardia denies have any palpitations. Atrial fibrillation maintained sinus rhythm she is on Eliquis  which I will continue. Essential hypertension present problem blood pressure being low. Diabetes to be managed by primary care physician. Dyslipidemia I did review [facility] which show me her LDL of 64 HDL 51 excellent control continue present management I told her she need to find some cardiologist in [place]  in the meantime we can do a virtual visit   Medication Adjustments/Labs and Tests Ordered: Current medicines are reviewed at length with the patient today.  Concerns regarding medicines are outlined above.  Orders Placed This Encounter  Procedures   EKG 12-Lead   Medication changes: No orders of the defined types were placed in this encounter.   Signed, [place] DOROTHA Bernie, MD, FACC 07/05/2024  8:51 AM    [facility]

## 2024-07-05 NOTE — Addendum Note (Signed)
 Addended by: ARLOA PLANAS D on: 07/05/2024 09:05 AM   Modules accepted: Orders

## 2024-07-05 NOTE — Telephone Encounter (Signed)
 Left detailed instructions for MPI study on pt's vm.

## 2024-07-06 ENCOUNTER — Other Ambulatory Visit: Payer: Self-pay | Admitting: Cardiology

## 2024-07-06 ENCOUNTER — Encounter: Payer: Self-pay | Admitting: Behavioral Health

## 2024-07-06 ENCOUNTER — Ambulatory Visit (INDEPENDENT_AMBULATORY_CARE_PROVIDER_SITE_OTHER): Payer: Medicare (Managed Care) | Admitting: Behavioral Health

## 2024-07-06 DIAGNOSIS — F331 Major depressive disorder, recurrent, moderate: Secondary | ICD-10-CM

## 2024-07-06 DIAGNOSIS — F411 Generalized anxiety disorder: Secondary | ICD-10-CM | POA: Diagnosis not present

## 2024-07-06 DIAGNOSIS — R0609 Other forms of dyspnea: Secondary | ICD-10-CM

## 2024-07-06 NOTE — Progress Notes (Signed)
 [facility] Counselor/Therapist Progress Note  Patient ID: Paula Stout, MRN: [id number],    Date: 07/06/2024 Time Spent: 2 PM until 2:58 PM, 58 minutes. The patient consented to the video teletherapy and was located in her home during this session. She is aware it is the responsibility of the patient to secure confidentiality on her end of the session. The provider was in a private home  for the duration of this session.      Treatment Type: Individual Therapy  Reported Symptoms: Anxiety, depression  Mental Status Exam: Appearance:  Casual     Behavior: Appropriate  Motor: Normal  Speech/Language:  Normal Rate  Affect: Appropriate  Mood: normal  Thought process: normal  Thought content:   WNL  Sensory/Perceptual disturbances:   WNL  Orientation: oriented to person, place, time/date, situation, day of week, month of year, and year  Attention: Good  Concentration: Good  Memory: WNL  Fund of knowledge:  Good  Insight:   Good  Judgment:  Good  Impulse Control: Good   Risk Assessment: Danger to Self:  No Self-injurious Behavior: No Danger to Others: No Duty to Warn:no Physical Aggression / Violence:No  Access to Firearms a concern: No  Gang Involvement:No   Subjective: The patient has met with 2 doctors this week who ordered scans 1 more time before she moved to [place] .  They are going to do an ultrasound of her thyroid  as well as additional heart test.  She will have a primary care physician lined up in [place]  when she gets there and knows they can refer her to specialist.  Her primary care physician said he can do a video visit if need be even after she moves there prior to her seeing a PCP in [place] .  She has had a couple of panic attacks.  1 was when she was trying to get her nails done and the person doing her hands was very fidgety and bouncy and that was very tough for her.  She put her earbuds in and did some deep breathing was able to make  it through but says she knows that she needs to calm her father's caregivers etc.  The other was in her home.  The chair that she brought to her daughter's house her daughter's dog broke so she has really no or to sit when she is downstairs that her daughter told her it was best that she just stayed upstairs as much as she could.  There have been financial disagreements with the daughter that she is currently living with.  She is not getting any cooperation with helping to move bins that she is loading up for the move into the garage.  She has 2 sewing machines which she cannot move by herself and her daughter at least at this point is not willing to help with.  She can slide the beds down the stairs that have close in things and then but this long as she is needed to be.  And her daughter will not let anyone else in the house either.  She is using her coping skills as much as she can and staying positive.  She is counting the days to when she cannot move to [place]  to be with her other daughters.  She does contract for safety having no thoughts of hurting herself or anyone else. Diagnosis: Generalized anxiety disorder, major depressive disorder, moderate, recurrent Plan: I will meet with the patient weekly via care agility.  Treatment plan: We will use  cognitive behavioral therapy as well as person centered and supportive therapy in addition to elements of dialectical behavior therapy to help reduce the patient's anxiety and depression by at least 50% with a target date of October 06, 2023.  Goals for improving depression may include having less sadness as indicated by patient report and scores on the PHQ-9, have improved mood and return to a healthier level of functioning, identify causes including environment for depressed mood and learn ways to cope with depression especially those connected to her medical issues.  Interventions include using cognitive behavioral therapy to explore and replace  thoughts and behaviors.  We will look at how depression is experienced in day-to-day living and encouraged sharing of feelings.  We will encourage the use of coping skills for management of depressive symptoms.  Goals for reducing anxiety are to improve her ability to manage anxiety symptoms, better handle stress, identify causes for anxiety and explore ways to lower it, resolve the core conflicts contributing to anxiety as well as manage thoughts and worrisome thinking contributing to feelings of anxiety.  Interventions will include providing education about anxiety, facilitate problem solution skills to help her identify options for resolving stress, teach coping skills for managing anxiety as well as mindfulness and communication in terms of family to reduce her stress level, use cognitive behavior therapy to identify and change anxiety provoking thought and behavior patterns as well as teach distress tolerance and mindfulness skills for alleviating anxiety. Progress: 40% progress notes were reviewed with a new extended target date of  January 31st, 2026.  Lorrene CHRISTELLA Hasten, [facility]                                         Lorrene CHRISTELLA Hasten, [facility]               Lorrene CHRISTELLA Hasten, [facility]               Lorrene CHRISTELLA Hasten, [facility]               Lorrene CHRISTELLA Hasten, [facility]               Lorrene CHRISTELLA Hasten, [facility] [place]               Lorrene CHRISTELLA Hasten, [facility]               Lorrene CHRISTELLA Hasten, [facility]               Lorrene CHRISTELLA Hasten, [facility]               Lorrene CHRISTELLA Hasten, [facility]               Lorrene CHRISTELLA Hasten, [facility]               Lorrene CHRISTELLA Hasten, [facility]               Lorrene CHRISTELLA Hasten, [facility]               Lorrene CHRISTELLA Hasten, [facility]               Lorrene CHRISTELLA Hasten, [facility]               Lorrene CHRISTELLA Hasten,  [facility]               Lorrene CHRISTELLA Hasten, [facility]  Lorrene CHRISTELLA Hasten, [facility]               Lorrene CHRISTELLA Hasten, [facility]               Lorrene CHRISTELLA Hasten, [facility]               Lorrene CHRISTELLA Hasten, [facility]

## 2024-07-09 ENCOUNTER — Encounter: Payer: Self-pay | Admitting: Radiology

## 2024-07-09 ENCOUNTER — Ambulatory Visit (HOSPITAL_COMMUNITY)
Admission: RE | Admit: 2024-07-09 | Discharge: 2024-07-09 | Disposition: A | Discharge status: Home / Self Care | Payer: Medicare (Managed Care) | Source: Ambulatory Visit | Attending: Cardiology | Admitting: Cardiology

## 2024-07-09 DIAGNOSIS — I700 Atherosclerosis of aorta: Secondary | ICD-10-CM | POA: Diagnosis not present

## 2024-07-09 DIAGNOSIS — R0609 Other forms of dyspnea: Secondary | ICD-10-CM | POA: Diagnosis not present

## 2024-07-09 DIAGNOSIS — E049 Nontoxic goiter, unspecified: Secondary | ICD-10-CM | POA: Diagnosis not present

## 2024-07-09 DIAGNOSIS — I728 Aneurysm of other specified arteries: Secondary | ICD-10-CM | POA: Diagnosis not present

## 2024-07-09 DIAGNOSIS — I3139 Other pericardial effusion (noninflammatory): Secondary | ICD-10-CM | POA: Diagnosis not present

## 2024-07-09 LAB — MYOCARDIAL PERFUSION IMAGING
LV dias vol: 95 mL (ref 46–106)
LV sys vol: 38 mL (ref 3.8–5.2)
MBFR: 2.22
Nuc Stress EF: 60 %
Peak HR: 93 {beats}/min
Rest HR: 66 {beats}/min
Rest MBF: 0.77 ml/g/min
Rest Nuclear Isotope Dose: 10.8 mCi
SDS: 0
SRS: 6
SSS: 2
ST Depression (mm): 0 mm
Stress MBF: 1.71 ml/g/min
Stress Nuclear Isotope Dose: 32.8 mCi
TID: 1.11

## 2024-07-09 MED ORDER — TECHNETIUM TC 99M TETROFOSMIN IV KIT
10.8000 | PACK | Freq: Once | INTRAVENOUS | Status: AC | PRN
Start: 2024-07-09 — End: 2024-07-09
  Administered 2024-07-09: 10.8 via INTRAVENOUS

## 2024-07-09 MED ORDER — REGADENOSON 0.4 MG/5ML IV SOLN
INTRAVENOUS | Status: AC
  Filled 2024-07-09: qty 5

## 2024-07-09 MED ORDER — TECHNETIUM TC 99M TETROFOSMIN IV KIT
32.8000 | PACK | Freq: Once | INTRAVENOUS | Status: AC | PRN
  Administered 2024-07-09: 32.8 via INTRAVENOUS

## 2024-07-09 MED ORDER — REGADENOSON 0.4 MG/5ML IV SOLN
0.4000 mg | Freq: Once | INTRAVENOUS | Status: AC
  Administered 2024-07-09: 0.4 mg via INTRAVENOUS

## 2024-07-11 ENCOUNTER — Encounter: Payer: Self-pay | Admitting: Behavioral Health

## 2024-07-11 ENCOUNTER — Ambulatory Visit: Payer: Medicare (Managed Care) | Admitting: Behavioral Health

## 2024-07-11 DIAGNOSIS — F411 Generalized anxiety disorder: Secondary | ICD-10-CM

## 2024-07-11 DIAGNOSIS — F331 Major depressive disorder, recurrent, moderate: Secondary | ICD-10-CM | POA: Diagnosis not present

## 2024-07-11 NOTE — Progress Notes (Signed)
 [facility] Counselor/Therapist Progress Note  Patient ID: Paula Stout, MRN: [id number],    Date: 07/11/2024 Time Spent: 2:10 PM until 2:57 PM, 47 minutes. The patient consented to the video teletherapy and was located in her home during this session. She is aware it is the responsibility of the patient to secure confidentiality on her end of the session. The provider was in a private home  for the duration of this session.      Treatment Type: Individual Therapy  Reported Symptoms: Anxiety, depression  Mental Status Exam: Appearance:  Casual     Behavior: Appropriate  Motor: Normal  Speech/Language:  Normal Rate  Affect: Appropriate  Mood: normal  Thought process: normal  Thought content:   WNL  Sensory/Perceptual disturbances:   WNL  Orientation: oriented to person, place, time/date, situation, day of week, month of year, and year  Attention: Good  Concentration: Good  Memory: WNL  Fund of knowledge:  Good  Insight:   Good  Judgment:  Good  Impulse Control: Good   Risk Assessment: Danger to Self:  No Self-injurious Behavior: No Danger to Others: No Duty to Warn:no Physical Aggression / Violence:No  Access to Firearms a concern: No  Gang Involvement:No   Subjective: The patient had a nuclear medicine test on her heart and although she has not gotten official results she had the technician stated that she felt that things looked good.  The patient was encouraged by that.  She has been able to get 4 or 5 more small bins filled and slid down the stairs.  Her daughter or son-in-law have not helped much yet but her son-in-law appears to be in a better mood and she is hopeful he will help with some of the bigger bins.  Digital pickup of her car is November 15 with her flight flying out on the 19th so she is feeling some pressure but has felt like she is gotten a little bit more done.  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  She says that he wants her to go a little faster but she is doing the best that she can while trying to pack at the same time she feels that she is keeping up that is will be positive for the company.  Her mood has been a bit more stable over the past week as she has gotten some things done and knows that she is getting closer to being able to move.  She is using her coping skills as much as she can and staying positive.  She is counting the days to when she can move to [place]  to be with her other daughters.  She does contract for safety having no thoughts of hurting herself or anyone else. Diagnosis: Generalized anxiety disorder, major depressive disorder, moderate, recurrent Plan: I will meet with the patient weekly via care agility.  Treatment plan: We will use cognitive behavioral therapy as well as person centered and supportive therapy in addition to elements of dialectical behavior therapy to help reduce the patient's anxiety and depression by at least 50% with a target date of October 06, 2023.  Goals for improving depression may include having less sadness as indicated by patient report and scores on the PHQ-9, have improved mood and return to a healthier level of functioning, identify causes including environment for depressed mood and learn ways to cope with depression especially those connected to her medical  issues.  Interventions include using cognitive behavioral therapy to explore and replace thoughts and behaviors.  We will look at how depression is experienced in day-to-day living and encouraged sharing of feelings.  We will encourage the use of coping skills for management of depressive symptoms.  Goals for reducing anxiety are to improve her ability to manage anxiety symptoms, better handle stress, identify causes for anxiety and explore ways to lower it, resolve the core conflicts contributing to anxiety as well as manage thoughts and worrisome thinking  contributing to feelings of anxiety.  Interventions will include providing education about anxiety, facilitate problem solution skills to help her identify options for resolving stress, teach coping skills for managing anxiety as well as mindfulness and communication in terms of family to reduce her stress level, use cognitive behavior therapy to identify and change anxiety provoking thought and behavior patterns as well as teach distress tolerance and mindfulness skills for alleviating anxiety. Progress: 40% progress notes were reviewed with a new extended target date of  January 31st, 2026.  Lorrene CHRISTELLA Hasten, [facility]                                         Lorrene CHRISTELLA Hasten, [facility]               Lorrene CHRISTELLA Hasten, [facility]               Lorrene CHRISTELLA Hasten, [facility]               Lorrene CHRISTELLA Hasten, [facility]               Lorrene CHRISTELLA Hasten, [facility]               Lorrene CHRISTELLA Hasten, [facility]               Lorrene CHRISTELLA Hasten, [facility]               Lorrene CHRISTELLA Hasten, [facility]               Lorrene CHRISTELLA Hasten, [facility]               Lorrene CHRISTELLA Hasten, [facility]               Lorrene CHRISTELLA Hasten, [facility]               Lorrene CHRISTELLA Hasten, [facility]               Lorrene CHRISTELLA Hasten, [facility]               Lorrene CHRISTELLA Hasten, [facility]               Lorrene CHRISTELLA Hasten, [facility]               Lorrene CHRISTELLA Hasten, [facility]               Lorrene CHRISTELLA Hasten, [facility]               Lorrene CHRISTELLA Hasten, [facility]               Lorrene CHRISTELLA Hasten, [facility]               Lorrene CHRISTELLA Hasten, [facility]               Lorrene CHRISTELLA Hasten, [facility]

## 2024-07-12 ENCOUNTER — Emergency Department (HOSPITAL_BASED_OUTPATIENT_CLINIC_OR_DEPARTMENT_OTHER): Payer: Medicare (Managed Care)

## 2024-07-12 ENCOUNTER — Ambulatory Visit: Payer: Self-pay | Admitting: Cardiology

## 2024-07-12 ENCOUNTER — Other Ambulatory Visit: Payer: Self-pay | Admitting: Neurology

## 2024-07-12 ENCOUNTER — Encounter (HOSPITAL_BASED_OUTPATIENT_CLINIC_OR_DEPARTMENT_OTHER): Payer: Self-pay | Admitting: Emergency Medicine

## 2024-07-12 ENCOUNTER — Other Ambulatory Visit: Payer: Self-pay | Admitting: Family Medicine

## 2024-07-12 ENCOUNTER — Encounter: Payer: Self-pay | Admitting: Family Medicine

## 2024-07-12 ENCOUNTER — Emergency Department (HOSPITAL_BASED_OUTPATIENT_CLINIC_OR_DEPARTMENT_OTHER)
Admission: EM | Admit: 2024-07-12 | Discharge: 2024-07-12 | Disposition: A | Discharge status: Home / Self Care | Payer: Medicare (Managed Care) | Attending: Emergency Medicine | Admitting: Emergency Medicine

## 2024-07-12 ENCOUNTER — Other Ambulatory Visit: Payer: Self-pay

## 2024-07-12 DIAGNOSIS — R519 Headache, unspecified: Secondary | ICD-10-CM | POA: Diagnosis present

## 2024-07-12 DIAGNOSIS — R55 Syncope and collapse: Secondary | ICD-10-CM | POA: Insufficient documentation

## 2024-07-12 DIAGNOSIS — I4891 Unspecified atrial fibrillation: Secondary | ICD-10-CM | POA: Insufficient documentation

## 2024-07-12 DIAGNOSIS — G250 Essential tremor: Secondary | ICD-10-CM

## 2024-07-12 DIAGNOSIS — W228XXA Striking against or struck by other objects, initial encounter: Secondary | ICD-10-CM | POA: Insufficient documentation

## 2024-07-12 DIAGNOSIS — Z7901 Long term (current) use of anticoagulants: Secondary | ICD-10-CM | POA: Diagnosis not present

## 2024-07-12 DIAGNOSIS — M4802 Spinal stenosis, cervical region: Secondary | ICD-10-CM | POA: Diagnosis not present

## 2024-07-12 DIAGNOSIS — R42 Dizziness and giddiness: Secondary | ICD-10-CM | POA: Diagnosis not present

## 2024-07-12 DIAGNOSIS — Z043 Encounter for examination and observation following other accident: Secondary | ICD-10-CM | POA: Diagnosis not present

## 2024-07-12 DIAGNOSIS — M47812 Spondylosis without myelopathy or radiculopathy, cervical region: Secondary | ICD-10-CM | POA: Diagnosis not present

## 2024-07-12 DIAGNOSIS — E042 Nontoxic multinodular goiter: Secondary | ICD-10-CM | POA: Diagnosis not present

## 2024-07-12 LAB — CBC
HCT: 39.5 % (ref 36.0–46.0)
Hemoglobin: 12.8 g/dL (ref 12.0–15.0)
MCH: 30.1 pg (ref 26.0–34.0)
MCHC: 32.4 g/dL (ref 30.0–36.0)
MCV: 92.9 fL (ref 80.0–100.0)
Platelets: 245 K/uL (ref 150–400)
RBC: 4.25 MIL/uL (ref 3.87–5.11)
RDW: 14 % (ref 11.5–15.5)
WBC: 6.7 K/uL (ref 4.0–10.5)
nRBC: 0 % (ref 0.0–0.2)

## 2024-07-12 LAB — BASIC METABOLIC PANEL WITH GFR
Anion gap: 12 (ref 5–15)
BUN: 20 mg/dL (ref 8–23)
CO2: 21 mmol/L — ABNORMAL LOW (ref 22–32)
Calcium: 9.5 mg/dL (ref 8.9–10.3)
Chloride: 111 mmol/L (ref 98–111)
Creatinine, Ser: 1.81 mg/dL — ABNORMAL HIGH (ref 0.44–1.00)
GFR, Estimated: 30 mL/min — ABNORMAL LOW (ref >60)
Glucose, Bld: 95 mg/dL (ref 70–99)
Potassium: 4 mmol/L (ref 3.5–5.1)
Sodium: 144 mmol/L (ref 135–145)

## 2024-07-12 LAB — TROPONIN T, HIGH SENSITIVITY
Troponin T High Sensitivity: <15 ng/L (ref 0–19)
Troponin T High Sensitivity: <15 ng/L (ref 0–19)

## 2024-07-12 MED ORDER — SODIUM CHLORIDE 0.9 % IV BOLUS
1000.0000 mL | Freq: Once | INTRAVENOUS | Status: AC
  Administered 2024-07-12: 1000 mL via INTRAVENOUS

## 2024-07-12 MED ORDER — PROCHLORPERAZINE EDISYLATE 10 MG/2ML IJ SOLN
10.0000 mg | Freq: Once | INTRAMUSCULAR | Status: AC
  Administered 2024-07-12: 10 mg via INTRAVENOUS
  Filled 2024-07-12: qty 2

## 2024-07-12 MED ORDER — METOCLOPRAMIDE HCL 5 MG/ML IJ SOLN
10.0000 mg | Freq: Once | INTRAMUSCULAR | Status: AC
  Administered 2024-07-12: 10 mg via INTRAVENOUS
  Filled 2024-07-12: qty 2

## 2024-07-12 MED ORDER — PRIMIDONE 50 MG PO TABS: 50.0000 mg | ORAL_TABLET | Freq: Every day | ORAL | 1 refills | Status: DC

## 2024-07-12 MED ORDER — DIPHENHYDRAMINE HCL 50 MG/ML IJ SOLN
25.0000 mg | Freq: Once | INTRAMUSCULAR | Status: AC
  Administered 2024-07-12: 25 mg via INTRAVENOUS
  Filled 2024-07-12: qty 1

## 2024-07-12 MED ORDER — DEXAMETHASONE SODIUM PHOSPHATE 4 MG/ML IJ SOLN
4.0000 mg | Freq: Once | INTRAMUSCULAR | Status: AC
  Administered 2024-07-12: 4 mg via INTRAVENOUS
  Filled 2024-07-12: qty 1

## 2024-07-12 NOTE — Discharge Instructions (Signed)
 As we discussed your CT head and your labs are normal today.  Please stay hydrated  If you have an episode of dizziness or palpitation I recommend you follow-up with your doctor to get a Holter monitor  Return to ER if you have worse dizziness or palpitations or chest pain

## 2024-07-12 NOTE — ED Triage Notes (Signed)
 Pt reports having a migraine x several days, taking as much maxalt  as she is allowed, having CP that began about 4 hours ago.  Reports getting dizzy, passing out, and hitting head on dishwasher losing consciousness.  Pt is on eliquis  for afib.

## 2024-07-12 NOTE — ED Provider Notes (Signed)
 [place] [facility] Provider Note   CSN: [id number] Arrival date & time: 07/12/24  8165     Patient presents with: Chest Pain and Fall on Thinners   Paula Stout is a 68 y.o. female with history of migraines, A-fib on Eliquis , here presenting with dizziness and syncope and head injury.  Patient states that she has been having headache for several days.  Patient been taking Maxalt  daily for the last 3 days.  Patient states that she had some chest pain several hours ago.  She states that she was doing the dishes and felt lightheaded and dizzy.  She states that she sat down and next thing she remembered the cat was on her and she passed out and hit her head on the dishwasher.  Denies any palpitations.  She states that this happened around 5:30 PM she waited until her daughter came home from work and drove her over for evaluation   The history is provided by the patient.       Prior to Admission medications   Medication Sig Start Date End Date Taking? Authorizing Provider  acetaminophen  (TYLENOL ) 500 MG tablet Take 1,000 mg by mouth every 6 (six) hours as needed for mild pain.    [provider]  atorvastatin  (LIPITOR ) 10 MG tablet Take 1 tablet (10 mg total) by mouth daily. 03/14/24   Frann Mabel Mt, DO  Baclofen  5 MG TABS TAKE ONE TABLET BY MOUTH TWICE A DAY AS NEEDED 07/03/24   Patel, Donika K, DO  budesonide -formoterol  (SYMBICORT ) 80-4.5 MCG/ACT inhaler Inhale 2 puffs into the lungs 2 (two) times daily. 05/01/24   Frann Mabel Mt, DO  buPROPion  (WELLBUTRIN  XL) 150 MG 24 hr tablet Take 1 tablet (150 mg total) by mouth daily. 06/11/24   Frann Mabel Mt, DO  Cholecalciferol (D 5000) 125 MCG (5000 UT) capsule Take 5,000 Units by mouth daily.    [provider]  Continuous Glucose Sensor (DEXCOM G7 SENSOR) MISC APPLY ONE SENSOR TO THE BACK OF YOUR UPPER ARM. REPLACE EVERY 10 DAYS AS DIRECTED 04/26/24   Frann Mabel Mt, DO  Cyanocobalamin  5000 MCG TBDP Take 5,000 mcg by mouth.    [provider]  ELIQUIS  5 MG TABS tablet TAKE 1 TABLET TWICE A DAY 06/04/24   Wendling, Mabel Mt, DO  EPINEPHRINE  0.3 mg/0.3 mL IJ SOAJ injection INJECT ONE PEN INTO THE THIGH OR AS DIRECTED. AFTER ADMINISTRATION CALL 911. IF ANAPHYLACTIC SYMPTOMS PERSIST AFTER FIRST DOSE, MAY REPEAT DOSE IN 5 TO 15 MINUTES. 04/26/24   Wendling, Mabel Mt, DO  escitalopram  (LEXAPRO ) 20 MG tablet Take 1 tablet (20 mg total) by mouth daily. 04/24/24   Teresa Redell LABOR, NP  fluticasone  (FLONASE ) 50 MCG/ACT nasal spray Place 2 sprays into both nostrils daily. 05/01/24   Frann Mabel Mt, DO  Fremanezumab -vfrm (AJOVY ) 225 MG/1.5ML SOAJ INJECT 225 MG UNDER THE SKIN EVERY 30 DAYS 05/02/24   Skeet Juliene SAUNDERS, DO  levocetirizine (XYZAL ) 5 MG tablet Take 1 tablet (5 mg total) by mouth every evening. 05/01/24   Frann, Mabel Mt, DO  LORazepam  (ATIVAN ) 0.5 MG tablet TAKE ONE TABLET BY MOUTH EVERY 8 HOURS 05/02/24   Teresa Redell A, NP  Lurasidone  HCl 120 MG TABS Take 1 tablet (120 mg total) by mouth at bedtime. 04/24/24   Teresa Redell LABOR, NP  magnesium  oxide (MAG-OX) 400 (240 Mg) MG tablet Take 400 mg by mouth daily.    [provider]  midodrine  (PROAMATINE ) 5  MG tablet Take 1 tablet (5 mg total) by mouth 3 (three) times daily with meals. 07/02/24   Frann Mabel Mt, DO  Multiple Vitamins-Minerals (MULTIVITAMIN WITH MINERALS) tablet Take 1 tablet by mouth daily. Daily Diabetes Health Pack - MVI + chromium, fish oil + vit D, magnesium , vit C, alpha lipoic acid w/ green tea    [provider]  nitroGLYCERIN  (NITROSTAT ) 0.4 MG SL tablet Place 1 tablet (0.4 mg total) under the tongue every 5 (five) minutes as needed for chest pain. 03/16/23   Krasowski, Robert J, MD  ondansetron  (ZOFRAN -ODT) 4 MG disintegrating tablet PLACE ONE TABLET ON THE TONGUE EVERY 8 HOURS AS NEEDED FOR NAUSEA AND/OR VOMITING 06/18/24    Wendling, Mabel Mt, DO  [facility] VERIO test strip USE DAILY TO CHECK BLOOD SUGAR 10/29/22   Frann, Mabel Mt, DO  primidone (MYSOLINE) 50 MG tablet Take 1 tablet (50 mg total) by mouth at bedtime. 07/12/24   Frann Mabel Mt, DO  QUEtiapine  (SEROQUEL ) 300 MG tablet Take 1 tablet (300 mg total) by mouth at bedtime. 04/24/24   Teresa Redell LABOR, NP  Rimegepant Sulfate  (NURTEC) 75 MG TBDP Take 1 tablet (75 mg total) by mouth as needed (take 1 tab at the earlist onset of a migraine. Max 1 tab in 24 hours). 07/02/24   Frann Mabel Mt, DO  tirzepatide  (MOUNJARO ) 5 MG/0.5ML Pen Inject 5 mg into the skin once a week. 06/05/24   Frann Mabel Mt, DO  topiramate  (TOPAMAX ) 50 MG tablet TAKE 3 TABLETS TWICE A DAY 08/23/23   Wendling, Mabel Mt, DO    Allergies: Bee venom, Onion, Tetanus-diphtheria toxoids td, Sumatriptan , Zolpidem , Asa [aspirin ], Iodine, Nsaids, Sulfa antibiotics, and Vancomycin    Review of Systems  Cardiovascular:  Positive for chest pain.  Neurological:  Positive for dizziness.  All other systems reviewed and are negative.   Updated Vital Signs BP 122/85   Pulse (!) 59   Temp 98.4 F (36.9 C) (Oral)   Resp 16   Ht 5' 6 (1.676 m)   Wt 76.7 kg   SpO2 99%   BMI 27.28 kg/m   Physical Exam Vitals and nursing note reviewed.  HENT:     Head:     Comments: Small posterior hematoma  Eyes:     Extraocular Movements: Extraocular movements intact.     Pupils: Pupils are equal, round, and reactive to light.  Cardiovascular:     Rate and Rhythm: Normal rate and regular rhythm.     Heart sounds: Normal heart sounds.  Pulmonary:     Effort: Pulmonary effort is normal.     Breath sounds: Normal breath sounds.  Abdominal:     General: Bowel sounds are normal.     Palpations: Abdomen is soft.  Musculoskeletal:        General: Normal range of motion.     Cervical back: Normal range of motion and neck supple.  Skin:    General: Skin is warm.      Capillary Refill: Capillary refill takes less than 2 seconds.  Neurological:     General: No focal deficit present.     Mental Status: She is alert and oriented to person, place, and time.     (all labs ordered are listed, but only abnormal results are displayed) Labs Reviewed  CBC  BASIC METABOLIC PANEL WITH GFR  TROPONIN T, HIGH SENSITIVITY    EKG: None  Radiology: DG Chest Portable 1 View Result Date: 07/12/2024 CLINICAL DATA:  Fall. EXAM:  PORTABLE CHEST 1 VIEW COMPARISON:  Chest radiograph dated 06/03/2024. FINDINGS: The heart size and mediastinal contours are within normal limits. Both lungs are clear. The visualized skeletal structures are unremarkable. IMPRESSION: No active disease. Electronically Signed   By: Vanetta Chou M.D.   On: 07/12/2024 19:22     Procedures   Medications Ordered in the ED  metoCLOPramide  (REGLAN ) injection 10 mg (has no administration in time range)  diphenhydrAMINE  (BENADRYL ) injection 25 mg (has no administration in time range)  sodium chloride  0.9 % bolus 1,000 mL (1,000 mLs Intravenous New Bag/Given 07/12/24 1936)                                    Medical Decision Making Paula Stout is a 68 y.o. female here presenting with headache and syncope.  Unclear why she syncopized.  Patient had poor p.o. intake so considered dehydration.  Also consider arrhythmia versus ACS causing syncope.  Also consider worsening headache versus subarachnoid hemorrhage followed by syncope.  Plan to get CBC and CMP and troponin x 2 and orthostatics and CT head and cervical spine.  10:26 PM I reviewed patient's labs and creatinine is baseline at 1.8.  Troponin is negative x 2.  CT head showed no bleed.  Headache slightly improved after migraine cocktail.  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  I wonder if she has intermittent A-fib causing her dizziness and  near syncope.  Told her to follow-up with her cardiologist given a Holter monitor.  If she has another episode of syncope she can return back to the ER.  Problems Addressed: Near syncope: acute illness or injury Nonintractable headache, unspecified chronicity pattern, unspecified headache type: acute illness or injury  Amount and/or Complexity of Data Reviewed Labs: ordered. Decision-making details documented in ED Course. Radiology: ordered and independent interpretation performed. Decision-making details documented in ED Course. ECG/medicine tests: ordered and independent interpretation performed. Decision-making details documented in ED Course.  Risk Prescription drug management.     Final diagnoses:  None    ED Discharge Orders     None          Patt Alm Macho, MD 07/12/24 2227

## 2024-07-12 NOTE — ED Notes (Signed)
 Patient transported to CT

## 2024-07-13 ENCOUNTER — Other Ambulatory Visit: Payer: Self-pay | Admitting: Neurology

## 2024-07-13 ENCOUNTER — Other Ambulatory Visit: Payer: Self-pay | Admitting: Family Medicine

## 2024-07-13 DIAGNOSIS — E041 Nontoxic single thyroid nodule: Secondary | ICD-10-CM | POA: Diagnosis not present

## 2024-07-13 DIAGNOSIS — E040 Nontoxic diffuse goiter: Secondary | ICD-10-CM | POA: Diagnosis not present

## 2024-07-16 ENCOUNTER — Other Ambulatory Visit: Payer: Self-pay

## 2024-07-16 ENCOUNTER — Telehealth: Payer: Self-pay

## 2024-07-16 DIAGNOSIS — H5213 Myopia, bilateral: Secondary | ICD-10-CM | POA: Diagnosis not present

## 2024-07-16 DIAGNOSIS — H524 Presbyopia: Secondary | ICD-10-CM | POA: Diagnosis not present

## 2024-07-16 DIAGNOSIS — H52203 Unspecified astigmatism, bilateral: Secondary | ICD-10-CM | POA: Diagnosis not present

## 2024-07-16 DIAGNOSIS — H2513 Age-related nuclear cataract, bilateral: Secondary | ICD-10-CM | POA: Diagnosis not present

## 2024-07-16 DIAGNOSIS — H43393 Other vitreous opacities, bilateral: Secondary | ICD-10-CM | POA: Diagnosis not present

## 2024-07-16 DIAGNOSIS — E119 Type 2 diabetes mellitus without complications: Secondary | ICD-10-CM | POA: Diagnosis not present

## 2024-07-16 DIAGNOSIS — H04123 Dry eye syndrome of bilateral lacrimal glands: Secondary | ICD-10-CM | POA: Diagnosis not present

## 2024-07-16 MED ORDER — MIDODRINE HCL 5 MG PO TABS: 5.0000 mg | ORAL_TABLET | Freq: Three times a day (TID) | ORAL | 0 refills | Status: DC

## 2024-07-16 NOTE — Telephone Encounter (Signed)
 Left message on My Chart with stress test results per Dr. Tonja Fray note. Routed to PCP.

## 2024-07-19 ENCOUNTER — Ambulatory Visit: Payer: Medicare (Managed Care) | Admitting: Behavioral Health

## 2024-07-19 DIAGNOSIS — F411 Generalized anxiety disorder: Secondary | ICD-10-CM | POA: Diagnosis not present

## 2024-07-19 DIAGNOSIS — F331 Major depressive disorder, recurrent, moderate: Secondary | ICD-10-CM

## 2024-07-19 NOTE — Progress Notes (Signed)
 [facility] Counselor/Therapist Progress Note  Patient ID: Paula Stout, MRN: [id number],    Date: 07/19/2024 Time Spent: 2:01 PM until 2:57 PM, 56 minutes. The patient consented to the video teletherapy and was located in her home during this session. She is aware it is the responsibility of the patient to secure confidentiality on her end of the session. The provider was in a private home  for the duration of this session.      Treatment Type: Individual Therapy  Reported Symptoms: Anxiety, depression  Mental Status Exam: Appearance:  Casual     Behavior: Appropriate  Motor: Normal  Speech/Language:  Normal Rate  Affect: Appropriate  Mood: normal  Thought process: normal  Thought content:   WNL  Sensory/Perceptual disturbances:   WNL  Orientation: oriented to person, place, time/date, situation, day of week, month of year, and year  Attention: Good  Concentration: Good  Memory: WNL  Fund of knowledge:  Good  Insight:   Good  Judgment:  Good  Impulse Control: Good   Risk Assessment: Danger to Self:  No Self-injurious Behavior: No Danger to Others: No Duty to Warn:no Physical Aggression / Violence:No  Access to Firearms a concern: No  Gang Involvement:No   Subjective: The patient fell over the past week and has a knot on her head.  She did get it checked out and everything is okay but she had a migraine at the time and they could not treat the migraine because of bumping her head.  She said thankfully she is feeling better and is not having migraines right now.  She thinks part of it is attributed to not sleeping well over the past few nights.  The poor sleep she thinks could goes to stress of getting ready to move within a week.  Made significant progress but there is still a lot to be done.  She says it is manageable but will take a lot of work trying to pack up and still work she still getting very little help from her daughter and son-in-law.  Her  daughter did move 1 tote out of her room but then started feeling not well and did not get it down the stairs.  Looked at options such as sliding it down the stairs if she cannot get her daughter or son-in-law to help.  We ask if there was a neighbor who helped load the car up.  They are coming to pick the car up on Monday the 17th and she is leaving on Wednesday the 19th.  For the most part she has checked all the boxes in terms of finishing doctors appointments, getting her dog ready to travel with her making arrangements for travel.  The hardest part in the last that she has to do is to finish packing and encouraged her again to ask her daughter and son-in-law to help knowing that she will be leaving in a week.  She recognize that being in a healthier environment will be good for her overall and is looking forward to the move and feels that she can manage the stress between now and then.  I will see her on the day before she is set to move. She does contract for safety having no thoughts of hurting herself or anyone else. Diagnosis: Generalized anxiety disorder, major depressive disorder, moderate, recurrent Plan: I will meet with the patient weekly via care agility.  Treatment plan: We will use cognitive behavioral therapy as well as person centered and supportive therapy in  addition to elements of dialectical behavior therapy to help reduce the patient's anxiety and depression by at least 50% with a target date of October 06, 2023.  Goals for improving depression may include having less sadness as indicated by patient report and scores on the PHQ-9, have improved mood and return to a healthier level of functioning, identify causes including environment for depressed mood and learn ways to cope with depression especially those connected to her medical issues.  Interventions include using cognitive behavioral therapy to explore and replace thoughts and behaviors.  We will look at how depression is  experienced in day-to-day living and encouraged sharing of feelings.  We will encourage the use of coping skills for management of depressive symptoms.  Goals for reducing anxiety are to improve her ability to manage anxiety symptoms, better handle stress, identify causes for anxiety and explore ways to lower it, resolve the core conflicts contributing to anxiety as well as manage thoughts and worrisome thinking contributing to feelings of anxiety.  Interventions will include providing education about anxiety, facilitate problem solution skills to help her identify options for resolving stress, teach coping skills for managing anxiety as well as mindfulness and communication in terms of family to reduce her stress level, use cognitive behavior therapy to identify and change anxiety provoking thought and behavior patterns as well as teach distress tolerance and mindfulness skills for alleviating anxiety. Progress: 40% progress notes were reviewed with a new extended target date of  January 31st, 2026.  Lorrene CHRISTELLA Hasten, [facility]                                         Lorrene CHRISTELLA Hasten, [facility]               Lorrene CHRISTELLA Hasten, [facility]               Lorrene CHRISTELLA Hasten, [facility]               Lorrene CHRISTELLA Hasten, [facility]               Lorrene CHRISTELLA Hasten, [facility]               Lorrene CHRISTELLA Hasten, [facility]               Lorrene CHRISTELLA Hasten, [facility]               Lorrene CHRISTELLA Hasten, [facility]               Lorrene CHRISTELLA Hasten, [facility]               Lorrene CHRISTELLA Hasten, [facility]               Lorrene CHRISTELLA Hasten, [facility]               Lorrene CHRISTELLA Hasten, [facility]               Lorrene CHRISTELLA Hasten, [facility]               Lorrene CHRISTELLA Hasten, [facility]               Lorrene CHRISTELLA Hasten, [facility]               Lorrene CHRISTELLA Hasten, [facility]               Lorrene CHRISTELLA Hasten, [facility]  Lorrene CHRISTELLA Hasten, [facility]               Lorrene CHRISTELLA Hasten, [facility]               Lorrene CHRISTELLA Hasten, [facility]               Lorrene CHRISTELLA Hasten, [facility]               Lorrene CHRISTELLA Hasten, [facility]

## 2024-07-20 ENCOUNTER — Emergency Department (HOSPITAL_BASED_OUTPATIENT_CLINIC_OR_DEPARTMENT_OTHER): Payer: Medicare (Managed Care)

## 2024-07-20 ENCOUNTER — Other Ambulatory Visit: Payer: Self-pay | Admitting: Family Medicine

## 2024-07-20 ENCOUNTER — Other Ambulatory Visit: Payer: Self-pay | Admitting: Neurology

## 2024-07-20 ENCOUNTER — Encounter: Payer: Self-pay | Admitting: Family Medicine

## 2024-07-20 ENCOUNTER — Encounter (HOSPITAL_BASED_OUTPATIENT_CLINIC_OR_DEPARTMENT_OTHER): Payer: Self-pay | Admitting: Emergency Medicine

## 2024-07-20 ENCOUNTER — Emergency Department (HOSPITAL_BASED_OUTPATIENT_CLINIC_OR_DEPARTMENT_OTHER)
Admission: EM | Admit: 2024-07-20 | Discharge: 2024-07-20 | Disposition: A | Discharge status: Home / Self Care | Payer: Medicare (Managed Care) | Attending: Emergency Medicine | Admitting: Emergency Medicine

## 2024-07-20 ENCOUNTER — Other Ambulatory Visit: Payer: Self-pay

## 2024-07-20 ENCOUNTER — Encounter: Payer: Self-pay | Admitting: Neurology

## 2024-07-20 DIAGNOSIS — R079 Chest pain, unspecified: Secondary | ICD-10-CM | POA: Diagnosis not present

## 2024-07-20 DIAGNOSIS — E1122 Type 2 diabetes mellitus with diabetic chronic kidney disease: Secondary | ICD-10-CM | POA: Insufficient documentation

## 2024-07-20 DIAGNOSIS — I129 Hypertensive chronic kidney disease with stage 1 through stage 4 chronic kidney disease, or unspecified chronic kidney disease: Secondary | ICD-10-CM | POA: Diagnosis not present

## 2024-07-20 DIAGNOSIS — G43909 Migraine, unspecified, not intractable, without status migrainosus: Secondary | ICD-10-CM | POA: Insufficient documentation

## 2024-07-20 DIAGNOSIS — Z87442 Personal history of urinary calculi: Secondary | ICD-10-CM | POA: Insufficient documentation

## 2024-07-20 DIAGNOSIS — I482 Chronic atrial fibrillation, unspecified: Secondary | ICD-10-CM | POA: Insufficient documentation

## 2024-07-20 DIAGNOSIS — J45909 Unspecified asthma, uncomplicated: Secondary | ICD-10-CM | POA: Diagnosis not present

## 2024-07-20 DIAGNOSIS — N183 Chronic kidney disease, stage 3 unspecified: Secondary | ICD-10-CM | POA: Insufficient documentation

## 2024-07-20 DIAGNOSIS — R0789 Other chest pain: Secondary | ICD-10-CM | POA: Diagnosis not present

## 2024-07-20 DIAGNOSIS — Z7901 Long term (current) use of anticoagulants: Secondary | ICD-10-CM | POA: Insufficient documentation

## 2024-07-20 DIAGNOSIS — R7989 Other specified abnormal findings of blood chemistry: Secondary | ICD-10-CM | POA: Insufficient documentation

## 2024-07-20 LAB — CBC
HCT: 38.2 % (ref 36.0–46.0)
Hemoglobin: 12.6 g/dL (ref 12.0–15.0)
MCH: 30.6 pg (ref 26.0–34.0)
MCHC: 33 g/dL (ref 30.0–36.0)
MCV: 92.7 fL (ref 80.0–100.0)
Platelets: 231 K/uL (ref 150–400)
RBC: 4.12 MIL/uL (ref 3.87–5.11)
RDW: 14.7 % (ref 11.5–15.5)
WBC: 6.6 K/uL (ref 4.0–10.5)
nRBC: 0 % (ref 0.0–0.2)

## 2024-07-20 LAB — BASIC METABOLIC PANEL WITH GFR
Anion gap: 15 (ref 5–15)
BUN: 19 mg/dL (ref 8–23)
CO2: 17 mmol/L — ABNORMAL LOW (ref 22–32)
Calcium: 9.7 mg/dL (ref 8.9–10.3)
Chloride: 111 mmol/L (ref 98–111)
Creatinine, Ser: 2.12 mg/dL — ABNORMAL HIGH (ref 0.44–1.00)
GFR, Estimated: 25 mL/min — ABNORMAL LOW (ref >60)
Glucose, Bld: 100 mg/dL — ABNORMAL HIGH (ref 70–99)
Potassium: 3.8 mmol/L (ref 3.5–5.1)
Sodium: 143 mmol/L (ref 135–145)

## 2024-07-20 LAB — TROPONIN T, HIGH SENSITIVITY: Troponin T High Sensitivity: <15 ng/L (ref 0–19)

## 2024-07-20 MED ORDER — SODIUM CHLORIDE 0.9 % IV BOLUS
500.0000 mL | Freq: Once | INTRAVENOUS | Status: AC
  Administered 2024-07-20: 500 mL via INTRAVENOUS

## 2024-07-20 MED ORDER — PROCHLORPERAZINE EDISYLATE 10 MG/2ML IJ SOLN
10.0000 mg | Freq: Once | INTRAMUSCULAR | Status: AC
  Administered 2024-07-20: 10 mg via INTRAVENOUS
  Filled 2024-07-20: qty 2

## 2024-07-20 MED ORDER — HYDROMORPHONE HCL 1 MG/ML IJ SOLN: 1.0000 mg | Freq: Once | INTRAMUSCULAR | Status: DC

## 2024-07-20 MED ORDER — DIPHENHYDRAMINE HCL 50 MG/ML IJ SOLN
25.0000 mg | Freq: Once | INTRAMUSCULAR | Status: AC
  Administered 2024-07-20: 25 mg via INTRAVENOUS
  Filled 2024-07-20: qty 1

## 2024-07-20 MED ORDER — HYDROMORPHONE HCL 1 MG/ML IJ SOLN
0.5000 mg | Freq: Once | INTRAMUSCULAR | Status: AC
  Administered 2024-07-20: 0.5 mg via INTRAVENOUS
  Filled 2024-07-20: qty 1

## 2024-07-20 MED ORDER — DEXAMETHASONE SOD PHOSPHATE PF 10 MG/ML IJ SOLN
10.0000 mg | Freq: Once | INTRAMUSCULAR | Status: AC
  Administered 2024-07-20: 10 mg via INTRAVENOUS

## 2024-07-20 MED ORDER — AJOVY 225 MG/1.5ML ~~LOC~~ SOAJ: SUBCUTANEOUS | 5 refills | Status: DC

## 2024-07-20 MED ORDER — METOCLOPRAMIDE HCL 5 MG/ML IJ SOLN
10.0000 mg | Freq: Once | INTRAMUSCULAR | Status: AC
  Administered 2024-07-20: 10 mg via INTRAVENOUS
  Filled 2024-07-20: qty 2

## 2024-07-20 NOTE — ED Triage Notes (Signed)
 Pt c/o migraine x 1 week, and chest pain, intermittent, worse today.   Took nurtec this AM.

## 2024-07-20 NOTE — ED Provider Notes (Signed)
 [facility] Provider Note   CSN: [id number] Arrival date & time: 07/20/24  1805     Patient presents with: Chest Pain and Migraine   Paula Stout is a 68 y.o. female.   Patient is a 68 year old female with a history of atrial fibrillation on anticoagulation, bipolar disorder, migraines, diabetes, GERD.  She presents today with migrainous type headache.  She said it has been going on for a few days.  It is in the back of her head and radiates around to her right forehead.  She says it is the same type of headache that she always has with her migraines.  There is no neck pain.  No fevers.  She does have some nausea and vomiting.  She says that she is in between treatments from her neurologist and that is when she typically gets her worst migraines.  She denies any recent falls or head injuries.  She also reports some chest pain.  She said it started this morning and is been pretty constant through the day.  She said she was just standing there and it felt like someone punched her in the chest.  It is in the center of her chest.  She does have some shortness of breath with it but she says she typically has shortness of breath.  She denies any cough or cold symptoms.  No increased leg swelling.  She has been seen in the ED for similar symptoms in the past.  She was recently seen on November 6 for a migrainous type headache and near syncope.  She had a CT scan at that time that did not show any acute intracranial abnormality.       Prior to Admission medications   Medication Sig Start Date End Date Taking? Authorizing Provider  acetaminophen  (TYLENOL ) 500 MG tablet Take 1,000 mg by mouth every 6 (six) hours as needed for mild pain.    [provider]  atorvastatin  (LIPITOR ) 10 MG tablet Take 1 tablet (10 mg total) by mouth daily. 03/14/24   Frann Mabel Mt, DO  Baclofen  5 MG TABS TAKE ONE TABLET BY MOUTH TWICE A DAY AS NEEDED 07/03/24    Patel, Donika K, DO  budesonide -formoterol  (SYMBICORT ) 80-4.5 MCG/ACT inhaler Inhale 2 puffs into the lungs 2 (two) times daily. 05/01/24   Frann Mabel Mt, DO  buPROPion  (WELLBUTRIN  XL) 150 MG 24 hr tablet Take 1 tablet (150 mg total) by mouth daily. 06/11/24   Frann Mabel Mt, DO  Cholecalciferol (D 5000) 125 MCG (5000 UT) capsule Take 5,000 Units by mouth daily.    [provider]  Continuous Glucose Sensor (DEXCOM G7 SENSOR) MISC APPLY ONE SENSOR TO THE BACK OF YOUR UPPER ARM. REPLACE EVERY 10 DAYS AS DIRECTED 04/26/24   Frann Mabel Mt, DO  Cyanocobalamin  5000 MCG TBDP Take 5,000 mcg by mouth.    [provider]  ELIQUIS  5 MG TABS tablet TAKE 1 TABLET TWICE A DAY 06/04/24   Wendling, Mabel Mt, DO  EPINEPHRINE  0.3 mg/0.3 mL IJ SOAJ injection INJECT ONE PEN INTO THE THIGH OR AS DIRECTED. AFTER ADMINISTRATION CALL 911. IF ANAPHYLACTIC SYMPTOMS PERSIST AFTER FIRST DOSE, MAY REPEAT DOSE IN 5 TO 15 MINUTES. 04/26/24   Wendling, Mabel Mt, DO  escitalopram  (LEXAPRO ) 20 MG tablet Take 1 tablet (20 mg total) by mouth daily. 04/24/24   Teresa Redell LABOR, NP  fluticasone  (FLONASE ) 50 MCG/ACT nasal spray Place 2 sprays into both nostrils daily. 05/01/24   Wendling,  Mabel Mt, DO  Fremanezumab -vfrm (AJOVY ) 225 MG/1.5ML SOAJ INJECT 225 MG UNDER THE SKIN EVERY 30 DAYS 07/20/24   Skeet Juliene SAUNDERS, DO  levocetirizine (XYZAL ) 5 MG tablet Take 1 tablet (5 mg total) by mouth every evening. 05/01/24   Frann, Mabel Mt, DO  LORazepam  (ATIVAN ) 0.5 MG tablet TAKE ONE TABLET BY MOUTH EVERY 8 HOURS 05/02/24   Teresa Rogue A, NP  Lurasidone  HCl 120 MG TABS Take 1 tablet (120 mg total) by mouth at bedtime. 04/24/24   Teresa Rogue LABOR, NP  magnesium  oxide (MAG-OX) 400 (240 Mg) MG tablet Take 400 mg by mouth daily.    [provider]  midodrine  (PROAMATINE ) 5 MG tablet Take 1 tablet (5 mg total) by mouth 3 (three) times daily with meals. 07/16/24   Frann Mabel Mt,  DO  MOUNJARO  5 MG/0.5ML Pen INJECT THE CONTENTS OF ONE PEN UNDER THE SKIN WEEKLY ON THE SAME DAY EACH WEEK 07/16/24   Wendling, Mabel Mt, DO  Multiple Vitamins-Minerals (MULTIVITAMIN WITH MINERALS) tablet Take 1 tablet by mouth daily. Daily Diabetes Health Pack - MVI + chromium, fish oil + vit D, magnesium , vit C, alpha lipoic acid w/ green tea    [provider]  nitroGLYCERIN  (NITROSTAT ) 0.4 MG SL tablet Place 1 tablet (0.4 mg total) under the tongue every 5 (five) minutes as needed for chest pain. 03/16/23   Krasowski, Robert J, MD  ondansetron  (ZOFRAN -ODT) 4 MG disintegrating tablet PLACE ONE TABLET ON THE TONGUE EVERY 8 HOURS AS NEEDED FOR NAUSEA AND/OR VOMITING 06/18/24   Wendling, Mabel Mt, DO  [facility] VERIO test strip USE DAILY TO CHECK BLOOD SUGAR 10/29/22   Frann, Mabel Mt, DO  primidone (MYSOLINE) 50 MG tablet Take 1 tablet (50 mg total) by mouth at bedtime. 07/12/24   Frann Mabel Mt, DO  QUEtiapine  (SEROQUEL ) 300 MG tablet Take 1 tablet (300 mg total) by mouth at bedtime. 04/24/24   Teresa Rogue LABOR, NP  Rimegepant Sulfate  (NURTEC) 75 MG TBDP Take 1 tablet (75 mg total) by mouth as needed (take 1 tab at the earlist onset of a migraine. Max 1 tab in 24 hours). 07/02/24   Frann Mabel Mt, DO  topiramate  (TOPAMAX ) 50 MG tablet TAKE 3 TABLETS TWICE A DAY 08/23/23   Wendling, Mabel Mt, DO    Allergies: Bee venom, Onion, Tetanus-diphtheria toxoids td, Sumatriptan , Zolpidem , Asa [aspirin ], Iodine, Nsaids, Sulfa antibiotics, and Vancomycin    Review of Systems  Constitutional:  Negative for chills, diaphoresis, fatigue and fever.  HENT:  Negative for congestion, rhinorrhea and sneezing.   Eyes: Negative.   Respiratory:  Negative for cough and shortness of breath.   Cardiovascular:  Positive for chest pain. Negative for leg swelling.  Gastrointestinal:  Positive for nausea and vomiting. Negative for abdominal pain, blood in stool and diarrhea.   Genitourinary:  Negative for difficulty urinating, flank pain and frequency.  Musculoskeletal:  Negative for arthralgias and back pain.  Skin:  Negative for rash.  Neurological:  Positive for headaches. Negative for dizziness, speech difficulty, weakness and numbness.    Updated Vital Signs BP 125/86   Pulse 68   Temp 99.2 F (37.3 C) (Axillary)   Resp (!) 21   Wt 72.6 kg   SpO2 100%   BMI 25.82 kg/m   Physical Exam Constitutional:      Appearance: She is well-developed.  HENT:     Head: Normocephalic and atraumatic.  Eyes:     Extraocular Movements: Extraocular movements intact.  Conjunctiva/sclera: Conjunctivae normal.     Pupils: Pupils are equal, round, and reactive to light.  Neck:     Comments: No meningismus Cardiovascular:     Rate and Rhythm: Normal rate and regular rhythm.     Heart sounds: Normal heart sounds.  Pulmonary:     Effort: Pulmonary effort is normal. No respiratory distress.     Breath sounds: Normal breath sounds. No wheezing or rales.  Chest:     Chest wall: No tenderness.  Abdominal:     General: Bowel sounds are normal.     Palpations: Abdomen is soft.     Tenderness: There is no abdominal tenderness. There is no guarding or rebound.  Musculoskeletal:        General: Normal range of motion.     Cervical back: Normal range of motion and neck supple.  Lymphadenopathy:     Cervical: No cervical adenopathy.  Skin:    General: Skin is warm and dry.     Findings: No rash.  Neurological:     Mental Status: She is alert and oriented to person, place, and time.     Comments: Motor 5/5 all extremities Sensation grossly intact to LT all extremities Finger to Nose intact, no pronator drift CN II-XII grossly intact       (all labs ordered are listed, but only abnormal results are displayed) Labs Reviewed  BASIC METABOLIC PANEL WITH GFR - Abnormal; Notable for the following components:      Result Value   CO2 17 (*)    Glucose, Bld 100  (*)    Creatinine, Ser 2.12 (*)    GFR, Estimated 25 (*)    All other components within normal limits  CBC  TROPONIN T, HIGH SENSITIVITY    EKG: EKG Interpretation Date/Time:  Friday July 20 2024 18:58:49 EST Ventricular Rate:  64 PR Interval:  171 QRS Duration:  99 QT Interval:  423 QTC Calculation: 437 R Axis:   -35  Text Interpretation: Sinus rhythm Left axis deviation Borderline T abnormalities, anterior leads since last tracing no significant change Confirmed by Lenor Hollering [phone]) on 07/20/2024 7:10:02 PM  Radiology: [facility] Chest 2 View Result Date: 07/20/2024 EXAM: 2 VIEW(S) XRAY OF THE CHEST 07/20/2024 07:41:00 PM COMPARISON: 07/12/2024 CLINICAL HISTORY: chest pain FINDINGS: LUNGS AND PLEURA: No focal pulmonary opacity. No pleural effusion. No pneumothorax. HEART AND MEDIASTINUM: No acute abnormality of the cardiac and mediastinal silhouettes. BONES AND SOFT TISSUES: No acute osseous abnormality. IMPRESSION: 1. No acute cardiopulmonary process. Electronically signed by: Pinkie Pebbles MD 07/20/2024 07:50 PM EST RP Workstation: [id number]     Procedures   Medications Ordered in the ED  metoCLOPramide  (REGLAN ) injection 10 mg (10 mg Intravenous Given 07/20/24 1916)  diphenhydrAMINE  (BENADRYL ) injection 25 mg (25 mg Intravenous Given 07/20/24 1916)  dexamethasone  (DECADRON ) injection 10 mg (10 mg Intravenous Given 07/20/24 1915)  sodium chloride  0.9 % bolus 500 mL (0 mLs Intravenous Stopped 07/20/24 2029)  prochlorperazine  (COMPAZINE ) injection 10 mg (10 mg Intravenous Given 07/20/24 2030)  HYDROmorphone  (DILAUDID ) injection 0.5 mg (0.5 mg Intravenous Given 07/20/24 2032)                                    Medical Decision Making Amount and/or Complexity of Data Reviewed Labs: ordered. Radiology: ordered.  Risk Prescription drug management.   This patient presents to the ED for concern of migraine and chest pain, this involves  an extensive number of  treatment options, and is a complaint that carries with it a high risk of complications and morbidity.  I considered the following differential and admission for this acute, potentially life threatening condition.  The differential diagnosis includes migraine, intracranial hemorrhage, meningitis, stroke, ACS, arrhythmia, PE  MDM:    Patient is a 68 year old female who presents mostly with a migrainous type headache.  She has had similar headaches in the past.  She has been seen in the ED for these several times in the past.  She has a list of medications that she says she normally gets for her migraines which includes Reglan , Benadryl , Decadron , Compazine  and Dilaudid .  She has not had any recent trauma to her head.  She is on Eliquis  but her headache seems similar to her prior headaches.  She had a recent head CT that was negative.  She was initially given Decadron , Reglan  and Benadryl .  She did not have any improvement from this.  She was given Compazine  and Dilaudid  and her headache resolved.  She does not have any neurologic dysfunction or concerns for stroke or intracranial hemorrhage.  She did have some chest pain.  She denies any current chest pain.  Her EKG does not show any ischemic changes.  Her troponin is negative.  Only 1 troponin was done given that the pain had been going on all day.  Chest x-ray does not show any acute abnormality.  Her labs do show mild elevation in her creatinine as compared to her prior values.  She was given some IV fluids.  Discussed with her that she will need to have this closely followed by her PCP.  She was discharged home in good condition.  Return precautions were given.  (Labs, imaging, consults)  Labs: I Ordered, and personally interpreted labs.  The pertinent results include: Normal troponin, elevated creatinine  Imaging Studies ordered: I ordered imaging studies including chest x-ray I independently visualized and interpreted imaging. I agree with the  radiologist interpretation  Additional history obtained from chart.  External records from outside source obtained and reviewed including prior notes  Cardiac Monitoring: The patient was maintained on a cardiac monitor.  If on the cardiac monitor, I personally viewed and interpreted the cardiac monitored which showed an underlying rhythm of: Sinus rhythm  Reevaluation: After the interventions noted above, I reevaluated the patient and found that they have :improved  Social Determinants of Health:    Disposition: Discharged to home  Co morbidities that complicate the patient evaluation  Past Medical History:  Diagnosis Date   Adhesive capsulitis of left shoulder 12/16/2016   Asthma without status asthmaticus 02/06/2021   At risk for falls 02/04/2019   Atrial fibrillation (HCC) 09/01/2014   Last Assessment & Plan:  Formatting of this note might be different from the original. A:  Chronic.  Sinus rhythm at this time.  States she was told this during admission at [facility] in the past. P:  On baby aspirin  at home will continue.  Low dose metoprolol  started.   Bipolar 1 disorder, depressed, moderate (HCC) 02/18/2020   Capsulitis of left shoulder 10/20/2021   Chronic anticoagulation 02/20/2020   Chronic atrial fibrillation (HCC) 02/18/2020   Chronic headache 10/11/2013   Chronic interstitial cystitis 12/28/2005   Chronic migraine without aura, intractable, without status migrainosus 06/24/2017   Colon cancer screening 04/15/2016   Last Assessment & Plan:  Formatting of this note might be different from the original. Will schedule for colonoscopy  Depression, recurrent 02/18/2020   Diabetes mellitus (HCC)    Dyslipidemia 02/20/2020   GAD (generalized anxiety disorder) 02/18/2020   GERD (gastroesophageal reflux disease) 11/05/2021   H/O chest pain 07/31/2021   History of atrial fibrillation 06/24/2017   History of posttraumatic stress disorder (PTSD) 10/07/2016   Hyperosmolarity due  to secondary diabetes mellitus (HCC) 02/06/2021   Impaired mobility and activities of daily living 11/14/2015   Irritable bowel syndrome (IBS)    Junctional tachycardia 11/05/2021   Kidney stone 02/06/2021   Migraine without aura, not refractory 02/06/2021   Mixed hyperlipidemia 04/06/2021   Last Assessment & Plan:  Formatting of this note might be different from the original. 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 Controlled   Refractory migraine with aura 02/06/2021   Refractory migraine without aura 02/06/2021   Renal colic on left side 04/22/2017   Stage 3 chronic kidney disease (HCC) 02/20/2020   Thyroid  goiter    10/17/23 CT: right thyorid goiter with substernal extension, benign FNA 07/07/23   Unspecified adverse effect of other drug, medicinal and biological substance(995.29) 02/17/2006   Urinary tract obstruction 02/06/2021     Medicines Meds ordered this encounter  Medications   metoCLOPramide  (REGLAN ) injection 10 mg   diphenhydrAMINE  (BENADRYL ) injection 25 mg   dexamethasone  (DECADRON ) injection 10 mg   DISCONTD: HYDROmorphone  (DILAUDID ) injection 1 mg   sodium chloride  0.9 % bolus 500 mL   prochlorperazine  (COMPAZINE ) injection 10 mg   HYDROmorphone  (DILAUDID ) injection 0.5 mg    I have reviewed the patients home medicines and have made adjustments as needed  Problem List / ED Course: Problem List Items Addressed This Visit       Cardiovascular and Mediastinum   Migraines - Primary     Other   Chest pain   Other Visit Diagnoses       Elevated serum creatinine                    Final diagnoses:  Migraine without status  migrainosus, not intractable, unspecified migraine type  Chest pain, unspecified type  Elevated serum creatinine    ED Discharge Orders     None          Lenor Hollering, MD 07/20/24 2222

## 2024-07-20 NOTE — Discharge Instructions (Addendum)
 Your kidney function was a little bit worse than it normally is.  How close follow-up with your primary care doctor to recheck this.  Return to the emergency room if you have any worsening symptoms.

## 2024-07-20 NOTE — ED Notes (Signed)
 Patient verbalizes understanding of discharge instructions. Opportunity for questioning and answers were provided. Armband removed by staff, pt discharged from ED. Ambulated out to lobby, awaiting ride home from friend

## 2024-07-23 ENCOUNTER — Other Ambulatory Visit: Payer: Self-pay | Admitting: Family Medicine

## 2024-07-23 ENCOUNTER — Other Ambulatory Visit: Payer: Self-pay | Admitting: Neurology

## 2024-07-23 ENCOUNTER — Other Ambulatory Visit: Payer: Self-pay | Admitting: Behavioral Health

## 2024-07-23 DIAGNOSIS — F3130 Bipolar disorder, current episode depressed, mild or moderate severity, unspecified: Secondary | ICD-10-CM

## 2024-07-23 DIAGNOSIS — F411 Generalized anxiety disorder: Secondary | ICD-10-CM

## 2024-07-23 NOTE — Telephone Encounter (Signed)
 Pt was suppose to move to [place]  in October. Checked PDMP LR was 10/14 and was given only a 10 day supply.

## 2024-07-23 NOTE — Telephone Encounter (Signed)
 Sent MyChart message - did she move to [place] ?

## 2024-07-24 ENCOUNTER — Encounter: Payer: Self-pay | Admitting: Behavioral Health

## 2024-07-24 ENCOUNTER — Ambulatory Visit (INDEPENDENT_AMBULATORY_CARE_PROVIDER_SITE_OTHER): Payer: Medicare (Managed Care) | Admitting: Behavioral Health

## 2024-07-24 DIAGNOSIS — F331 Major depressive disorder, recurrent, moderate: Secondary | ICD-10-CM | POA: Diagnosis not present

## 2024-07-24 DIAGNOSIS — F411 Generalized anxiety disorder: Secondary | ICD-10-CM

## 2024-07-24 NOTE — Progress Notes (Signed)
 [facility] Counselor/Therapist Progress Note  Patient ID: Paula Stout, MRN: [id number],    Date: 07/24/2024 Time Spent: 2:00 PM until 2:44 PM, 4 minutes. The patient consented to the video teletherapy and was located in her home during this session. She is aware it is the responsibility of the patient to secure confidentiality on her end of the session. The provider was in a private home  for the duration of this session.      Treatment Type: Individual Therapy  Reported Symptoms: Anxiety, depression  Mental Status Exam: Appearance:  Casual     Behavior: Appropriate  Motor: Normal  Speech/Language:  Normal Rate  Affect: Appropriate  Mood: normal  Thought process: normal  Thought content:   WNL  Sensory/Perceptual disturbances:   WNL  Orientation: oriented to person, place, time/date, situation, day of week, month of year, and year  Attention: Good  Concentration: Good  Memory: WNL  Fund of knowledge:  Good  Insight:   Good  Judgment:  Good  Impulse Control: Good   Risk Assessment: Danger to Self:  No Self-injurious Behavior: No Danger to Others: No Duty to Warn:no Physical Aggression / Violence:No  Access to Firearms a concern: No  Gang Involvement:No   Subjective: The patient said that they did pick her car up yesterday and it is on the way to [place]  state.  She did not get as much into it as she wanted to but said that her daughter agreed at the first of the year to get applied and put stuff in it and ship it.  She does have a ring that she needs thankfully.  She has a couple of errands to run today but for the most part her close are packed and she is done everything she needs to get her dog ready to fly.  Her biggest anxiety is how he will handle flying but she has plans in place to keep him calm.  She has plans to get wheelchair transportation especially in the [place] airport to reduce to get easier physically on her.  She has doctor set up in  place when she gets to [place]  and will get specialist lined up.  She has got refills on all of her medications.  Encouraged her to continue her therapy when she moves into that area and get settled.  She does contract for safety having no thoughts of hurting herself or anyone else. Diagnosis: Generalized anxiety disorder, major depressive disorder, moderate, recurrent Plan: I will meet with the patient weekly via care agility.  Treatment plan: We will use cognitive behavioral therapy as well as person centered and supportive therapy in addition to elements of dialectical behavior therapy to help reduce the patient's anxiety and depression by at least 50% with a target date of October 06, 2023.  Goals for improving depression may include having less sadness as indicated by patient report and scores on the PHQ-9, have improved mood and return to a healthier level of functioning, identify causes including environment for depressed mood and learn ways to cope with depression especially those connected to her medical issues.  Interventions include using cognitive behavioral therapy to explore and replace thoughts and behaviors.  We will look at how depression is experienced in day-to-day living and encouraged sharing of feelings.  We will encourage the use of coping skills for management of depressive symptoms.  Goals for reducing anxiety are to improve her ability to manage anxiety symptoms, better handle stress, identify causes for anxiety and explore ways  to lower it, resolve the core conflicts contributing to anxiety as well as manage thoughts and worrisome thinking contributing to feelings of anxiety.  Interventions will include providing education about anxiety, facilitate problem solution skills to help her identify options for resolving stress, teach coping skills for managing anxiety as well as mindfulness and communication in terms of family to reduce her stress level, use cognitive behavior therapy  to identify and change anxiety provoking thought and behavior patterns as well as teach distress tolerance and mindfulness skills for alleviating anxiety. Progress: 40% progress notes were reviewed with a new extended target date of  January 31st, 2026.  Lorrene CHRISTELLA Hasten, [facility]                                         Lorrene CHRISTELLA Hasten, [facility]               Lorrene CHRISTELLA Hasten, [facility]               Lorrene CHRISTELLA Hasten, [facility]               Lorrene CHRISTELLA Hasten, [facility]               Lorrene CHRISTELLA Hasten, [facility]               Lorrene CHRISTELLA Hasten, [facility]               Lorrene CHRISTELLA Hasten, [facility]               Lorrene CHRISTELLA Hasten, [facility]               Lorrene CHRISTELLA Hasten, [facility]               Lorrene CHRISTELLA Hasten, [facility]               Lorrene CHRISTELLA Hasten, [facility]               Lorrene CHRISTELLA Hasten, [facility]               Lorrene CHRISTELLA Hasten, [facility]               Lorrene CHRISTELLA Hasten, [facility]               Lorrene CHRISTELLA Hasten, [facility]               Lorrene CHRISTELLA Hasten, [facility]               Lorrene CHRISTELLA Hasten, [facility]               Lorrene CHRISTELLA Hasten, [facility]               Lorrene CHRISTELLA Hasten, [facility]               Lorrene CHRISTELLA Hasten, [facility]               Lorrene CHRISTELLA Hasten, [facility]               Lorrene CHRISTELLA Hasten, [facility]               Lorrene CHRISTELLA Hasten, [facility]

## 2024-07-24 NOTE — Telephone Encounter (Signed)
 Pt reported moving to [facility] tomorrow. Wants all meds sent to pharmacy in [place], [place].

## 2024-07-25 ENCOUNTER — Ambulatory Visit: Payer: Medicare (Managed Care) | Admitting: Dermatology

## 2024-07-25 ENCOUNTER — Other Ambulatory Visit: Payer: Self-pay

## 2024-07-25 DIAGNOSIS — F3130 Bipolar disorder, current episode depressed, mild or moderate severity, unspecified: Secondary | ICD-10-CM

## 2024-07-25 DIAGNOSIS — F411 Generalized anxiety disorder: Secondary | ICD-10-CM

## 2024-07-25 MED ORDER — LORAZEPAM 0.5 MG PO TABS: 0.5000 mg | ORAL_TABLET | Freq: Three times a day (TID) | ORAL | 0 refills | Status: AC

## 2024-08-01 ENCOUNTER — Encounter: Payer: Self-pay | Admitting: Family Medicine

## 2024-08-01 MED ORDER — DEXCOM G7 SENSOR MISC: 3 refills | Status: DC

## 2024-08-05 ENCOUNTER — Encounter: Payer: Self-pay | Admitting: Family Medicine

## 2024-08-06 MED ORDER — MOUNJARO 5 MG/0.5ML ~~LOC~~ SOAJ: 5.0000 mg | SUBCUTANEOUS | 1 refills | Status: DC

## 2024-08-07 ENCOUNTER — Telehealth: Payer: Self-pay

## 2024-08-07 MED ORDER — DEXCOM G7 SENSOR MISC: 3 refills | Status: AC

## 2024-08-07 NOTE — Telephone Encounter (Signed)
 Copied from CRM #[id number]. Topic: Clinical - Prescription Issue >> Aug 07, 2024  1:51 PM Eva FALCON wrote: Reason for CRM: [place] from [facility] was calling in. States a prescription for Dexcom G6 sensors was sent in instead of the Dexcom G7 sensors. She was hoping a correct script for the Dexcom G7 sensors could be sent so they can  refund and do an exchange. Needs to be sent to [facility] on [address] [place], [place] [address]. Phone number to call back is [phone].

## 2024-08-07 NOTE — Telephone Encounter (Signed)
 Rx sent for G7 sensors.

## 2024-08-08 ENCOUNTER — Ambulatory Visit: Payer: Medicare (Managed Care) | Admitting: Neurology

## 2024-08-09 ENCOUNTER — Other Ambulatory Visit: Payer: Self-pay | Admitting: Neurology

## 2024-08-09 ENCOUNTER — Other Ambulatory Visit: Payer: Self-pay

## 2024-08-09 ENCOUNTER — Other Ambulatory Visit (HOSPITAL_COMMUNITY): Payer: Self-pay

## 2024-08-09 MED ORDER — BOTOX 200 UNITS IJ SOLR
INTRAMUSCULAR | 4 refills | Status: AC
  Filled 2024-08-09: qty 1, fill #0
  Filled 2024-08-16: qty 1, 90d supply, fill #0
  Filled 2024-08-22: qty 1, 84d supply, fill #0
  Filled 2024-09-14: qty 1, 90d supply, fill #0

## 2024-08-10 ENCOUNTER — Other Ambulatory Visit: Payer: Self-pay

## 2024-08-13 ENCOUNTER — Encounter: Payer: Self-pay | Admitting: Family Medicine

## 2024-08-13 DIAGNOSIS — I480 Paroxysmal atrial fibrillation: Secondary | ICD-10-CM

## 2024-08-13 DIAGNOSIS — G250 Essential tremor: Secondary | ICD-10-CM

## 2024-08-13 DIAGNOSIS — J4530 Mild persistent asthma, uncomplicated: Secondary | ICD-10-CM

## 2024-08-14 MED ORDER — TOPIRAMATE 50 MG PO TABS: 150.0000 mg | ORAL_TABLET | Freq: Two times a day (BID) | ORAL | 0 refills | Status: AC

## 2024-08-14 MED ORDER — ATORVASTATIN CALCIUM 10 MG PO TABS: 10.0000 mg | ORAL_TABLET | Freq: Every day | ORAL | 0 refills | Status: AC

## 2024-08-14 MED ORDER — MOUNJARO 5 MG/0.5ML ~~LOC~~ SOAJ: 5.0000 mg | SUBCUTANEOUS | 0 refills | Status: AC

## 2024-08-14 MED ORDER — BUDESONIDE-FORMOTEROL FUMARATE 80-4.5 MCG/ACT IN AERO: 2.0000 | INHALATION_SPRAY | Freq: Two times a day (BID) | RESPIRATORY_TRACT | 0 refills | Status: AC

## 2024-08-14 MED ORDER — PRIMIDONE 50 MG PO TABS: 50.0000 mg | ORAL_TABLET | Freq: Every day | ORAL | 0 refills | Status: AC

## 2024-08-14 MED ORDER — BUPROPION HCL ER (XL) 150 MG PO TB24: 150.0000 mg | ORAL_TABLET | Freq: Every day | ORAL | 0 refills | Status: AC

## 2024-08-14 MED ORDER — MIDODRINE HCL 5 MG PO TABS: 5.0000 mg | ORAL_TABLET | Freq: Three times a day (TID) | ORAL | 0 refills | Status: AC

## 2024-08-14 MED ORDER — APIXABAN 5 MG PO TABS: 5.0000 mg | ORAL_TABLET | Freq: Two times a day (BID) | ORAL | 0 refills | Status: DC

## 2024-08-15 ENCOUNTER — Ambulatory Visit: Payer: Medicare (Managed Care) | Admitting: Neurology

## 2024-08-16 ENCOUNTER — Other Ambulatory Visit: Payer: Self-pay

## 2024-08-20 ENCOUNTER — Encounter: Payer: Self-pay | Admitting: Neurology

## 2024-08-21 ENCOUNTER — Other Ambulatory Visit: Payer: Self-pay | Admitting: Neurology

## 2024-08-21 MED ORDER — BACLOFEN 5 MG PO TABS: 1.0000 | ORAL_TABLET | Freq: Two times a day (BID) | ORAL | 0 refills | Status: AC | PRN

## 2024-08-21 MED ORDER — AJOVY 225 MG/1.5ML ~~LOC~~ SOAJ: SUBCUTANEOUS | 0 refills | Status: AC

## 2024-08-22 ENCOUNTER — Other Ambulatory Visit: Payer: Self-pay | Admitting: Neurology

## 2024-08-22 ENCOUNTER — Other Ambulatory Visit: Payer: Self-pay

## 2024-08-22 DIAGNOSIS — T7840XD Allergy, unspecified, subsequent encounter: Secondary | ICD-10-CM

## 2024-08-23 ENCOUNTER — Telehealth: Payer: Self-pay | Admitting: Pharmacy Technician

## 2024-08-23 ENCOUNTER — Other Ambulatory Visit (HOSPITAL_COMMUNITY): Payer: Self-pay

## 2024-08-23 NOTE — Progress Notes (Signed)
 PA has been submitted, and telephone encounter has been created. Please see telephone encounter dated 12.18.25 .

## 2024-08-23 NOTE — Telephone Encounter (Signed)
 Pharmacy Patient Advocate Encounter   Received notification from Patient Pharmacy that prior authorization for BOTOX  200 is required/requested.   Insurance verification completed.   The patient is insured through [facility].   Per test claim: PA required; PA submitted to above mentioned insurance via Latent Key/confirmation #/EOC [id number] Status is pending

## 2024-08-24 ENCOUNTER — Ambulatory Visit: Payer: Medicare (Managed Care) | Admitting: Neurology

## 2024-08-24 ENCOUNTER — Other Ambulatory Visit (HOSPITAL_COMMUNITY): Payer: Self-pay

## 2024-08-24 NOTE — Telephone Encounter (Signed)
 Pharmacy Patient Advocate Encounter  Received notification from [employer] that Prior Authorization for BOTOX  200 has been APPROVED from 12.18.25 to 3.18.26. Ran test claim, Copay is $0. This test claim was processed through [facility]- copay amounts may vary at other pharmacies due to pharmacy/plan contracts, or as the patient moves through the different stages of their insurance plan.   PA #/Case ID/Reference #: [id number]

## 2024-08-27 ENCOUNTER — Other Ambulatory Visit: Payer: Self-pay

## 2024-08-27 DIAGNOSIS — I480 Paroxysmal atrial fibrillation: Secondary | ICD-10-CM

## 2024-08-27 MED ORDER — APIXABAN 5 MG PO TABS: 5.0000 mg | ORAL_TABLET | Freq: Two times a day (BID) | ORAL | 0 refills | Status: AC

## 2024-08-29 ENCOUNTER — Other Ambulatory Visit (HOSPITAL_COMMUNITY): Payer: Self-pay

## 2024-09-05 ENCOUNTER — Other Ambulatory Visit (HOSPITAL_COMMUNITY): Payer: Self-pay

## 2024-09-14 ENCOUNTER — Other Ambulatory Visit: Payer: Self-pay

## 2024-09-17 ENCOUNTER — Other Ambulatory Visit: Payer: Self-pay

## 2024-09-19 ENCOUNTER — Telehealth: Payer: Medicare (Managed Care) | Admitting: Behavioral Health

## 2024-09-19 ENCOUNTER — Other Ambulatory Visit: Payer: Self-pay

## 2024-10-02 ENCOUNTER — Other Ambulatory Visit: Payer: Self-pay

## 2024-10-03 ENCOUNTER — Other Ambulatory Visit: Payer: Self-pay

## 2024-10-05 ENCOUNTER — Other Ambulatory Visit: Payer: Self-pay

## 2024-10-08 ENCOUNTER — Other Ambulatory Visit: Payer: Self-pay

## 2024-10-09 ENCOUNTER — Other Ambulatory Visit: Payer: Self-pay
